# Patient Record
Sex: Female | Born: 1945 | Race: White | Hispanic: No | Marital: Married | Smoking: Never smoker
Health system: Southern US, Community
[De-identification: ages and names within clinical notes are randomized; demographics above are authoritative.]

## PROBLEM LIST (undated history)

## (undated) DIAGNOSIS — T8859XA Other complications of anesthesia, initial encounter: Secondary | ICD-10-CM

## (undated) DIAGNOSIS — R519 Headache, unspecified: Secondary | ICD-10-CM

## (undated) DIAGNOSIS — I209 Angina pectoris, unspecified: Secondary | ICD-10-CM

## (undated) DIAGNOSIS — E039 Hypothyroidism, unspecified: Secondary | ICD-10-CM

## (undated) DIAGNOSIS — Z9181 History of falling: Secondary | ICD-10-CM

## (undated) DIAGNOSIS — E1140 Type 2 diabetes mellitus with diabetic neuropathy, unspecified: Secondary | ICD-10-CM

## (undated) DIAGNOSIS — R4781 Slurred speech: Secondary | ICD-10-CM

## (undated) DIAGNOSIS — M7918 Myalgia, other site: Secondary | ICD-10-CM

## (undated) DIAGNOSIS — M542 Cervicalgia: Secondary | ICD-10-CM

## (undated) DIAGNOSIS — L719 Rosacea, unspecified: Secondary | ICD-10-CM

## (undated) DIAGNOSIS — G5600 Carpal tunnel syndrome, unspecified upper limb: Secondary | ICD-10-CM

## (undated) DIAGNOSIS — E7800 Pure hypercholesterolemia, unspecified: Secondary | ICD-10-CM

## (undated) DIAGNOSIS — T4145XA Adverse effect of unspecified anesthetic, initial encounter: Secondary | ICD-10-CM

## (undated) DIAGNOSIS — C801 Malignant (primary) neoplasm, unspecified: Secondary | ICD-10-CM

## (undated) DIAGNOSIS — M810 Age-related osteoporosis without current pathological fracture: Secondary | ICD-10-CM

## (undated) DIAGNOSIS — R42 Dizziness and giddiness: Secondary | ICD-10-CM

## (undated) DIAGNOSIS — C4491 Basal cell carcinoma of skin, unspecified: Secondary | ICD-10-CM

## (undated) DIAGNOSIS — R569 Unspecified convulsions: Secondary | ICD-10-CM

## (undated) DIAGNOSIS — M5020 Other cervical disc displacement, unspecified cervical region: Secondary | ICD-10-CM

## (undated) DIAGNOSIS — Z9641 Presence of insulin pump (external) (internal): Secondary | ICD-10-CM

## (undated) DIAGNOSIS — I639 Cerebral infarction, unspecified: Secondary | ICD-10-CM

## (undated) DIAGNOSIS — E876 Hypokalemia: Secondary | ICD-10-CM

## (undated) DIAGNOSIS — E11319 Type 2 diabetes mellitus with unspecified diabetic retinopathy without macular edema: Secondary | ICD-10-CM

## (undated) DIAGNOSIS — H547 Unspecified visual loss: Secondary | ICD-10-CM

## (undated) DIAGNOSIS — D649 Anemia, unspecified: Secondary | ICD-10-CM

## (undated) DIAGNOSIS — E119 Type 2 diabetes mellitus without complications: Secondary | ICD-10-CM

## (undated) DIAGNOSIS — R200 Anesthesia of skin: Secondary | ICD-10-CM

## (undated) DIAGNOSIS — K3184 Gastroparesis: Secondary | ICD-10-CM

## (undated) DIAGNOSIS — H5440 Blindness, one eye, unspecified eye: Secondary | ICD-10-CM

## (undated) DIAGNOSIS — I959 Hypotension, unspecified: Secondary | ICD-10-CM

## (undated) DIAGNOSIS — I10 Essential (primary) hypertension: Secondary | ICD-10-CM

## (undated) DIAGNOSIS — Z973 Presence of spectacles and contact lenses: Secondary | ICD-10-CM

## (undated) HISTORY — DX: Cerebral infarction, unspecified: I63.9

## (undated) HISTORY — PX: EYE SURGERY: SHX253

## (undated) HISTORY — PX: HEMORROIDECTOMY: SUR656

## (undated) HISTORY — PX: APPENDECTOMY: SHX54

## (undated) HISTORY — PX: TUBAL LIGATION: SHX77

## (undated) HISTORY — PX: HERNIA REPAIR: SHX51

## (undated) HISTORY — PX: FRACTURE SURGERY: SHX138

## (undated) HISTORY — PX: TONSILLECTOMY: SUR1361

## (undated) HISTORY — PX: JOINT REPLACEMENT: SHX530

## (undated) HISTORY — PX: SHOULDER HEMI-ARTHROPLASTY: SHX5049

## (undated) HISTORY — PX: SYNOVECTOMY: SHX133

## (undated) HISTORY — PX: BILATERAL CARPAL TUNNEL RELEASE: SHX6508

## (undated) HISTORY — PX: SHOULDER ARTHROSCOPY: SHX128

## (undated) HISTORY — PX: COLONOSCOPY: SHX174

## (undated) HISTORY — PX: OTHER SURGICAL HISTORY: SHX169

## (undated) HISTORY — PX: TRIGGER FINGER RELEASE: SHX641

---

## 2003-12-10 ENCOUNTER — Emergency Department (HOSPITAL_COMMUNITY): Admission: EM | Admit: 2003-12-10 | Discharge: 2003-12-10 | Payer: Self-pay | Admitting: Emergency Medicine

## 2004-08-11 ENCOUNTER — Ambulatory Visit: Payer: Self-pay | Admitting: Gastroenterology

## 2005-02-08 ENCOUNTER — Ambulatory Visit: Payer: Self-pay | Admitting: Ophthalmology

## 2005-02-15 ENCOUNTER — Ambulatory Visit: Payer: Self-pay | Admitting: Ophthalmology

## 2006-05-03 ENCOUNTER — Ambulatory Visit: Payer: Self-pay | Admitting: Family Medicine

## 2007-08-16 ENCOUNTER — Ambulatory Visit: Payer: Self-pay | Admitting: Ophthalmology

## 2007-08-16 ENCOUNTER — Other Ambulatory Visit: Payer: Self-pay

## 2007-08-28 ENCOUNTER — Ambulatory Visit: Payer: Self-pay | Admitting: Ophthalmology

## 2009-08-21 HISTORY — PX: AUGMENTATION MAMMAPLASTY: SUR837

## 2009-10-07 ENCOUNTER — Ambulatory Visit: Payer: Self-pay | Admitting: Gastroenterology

## 2010-01-21 ENCOUNTER — Inpatient Hospital Stay: Payer: Self-pay | Admitting: Internal Medicine

## 2011-02-08 ENCOUNTER — Encounter: Payer: Self-pay | Admitting: Neurology

## 2011-02-19 ENCOUNTER — Encounter: Payer: Self-pay | Admitting: Neurology

## 2011-03-22 ENCOUNTER — Encounter: Payer: Self-pay | Admitting: Neurology

## 2012-10-19 ENCOUNTER — Emergency Department: Payer: Self-pay | Admitting: Emergency Medicine

## 2012-10-19 LAB — PROTIME-INR
INR: 2.2
Prothrombin Time: 24.2 secs — ABNORMAL HIGH (ref 11.5–14.7)

## 2012-10-19 LAB — CBC
HCT: 36.5 % (ref 35.0–47.0)
Platelet: 213 10*3/uL (ref 150–440)

## 2012-10-19 LAB — TSH: Thyroid Stimulating Horm: 0.805 u[IU]/mL

## 2012-10-19 LAB — COMPREHENSIVE METABOLIC PANEL
Albumin: 4 g/dL (ref 3.4–5.0)
Bilirubin,Total: 0.3 mg/dL (ref 0.2–1.0)
Co2: 29 mmol/L (ref 21–32)
EGFR (Non-African Amer.): >60
Osmolality: 286 (ref 275–301)
Potassium: 4 mmol/L (ref 3.5–5.1)
SGOT(AST): 23 U/L (ref 15–37)
SGPT (ALT): 28 U/L (ref 12–78)

## 2012-10-19 LAB — URINALYSIS, COMPLETE
Bacteria: NONE SEEN
Bilirubin,UR: NEGATIVE
Blood: NEGATIVE
Glucose,UR: >=500 mg/dL (ref 0–75)
Ketone: NEGATIVE
Leukocyte Esterase: NEGATIVE
Nitrite: NEGATIVE
Ph: 5 (ref 4.5–8.0)
Protein: NEGATIVE
RBC,UR: 1 /HPF (ref 0–5)
WBC UR: <1 /HPF (ref 0–5)

## 2012-10-19 LAB — TROPONIN I: Troponin-I: <0.02 ng/mL

## 2013-06-25 ENCOUNTER — Ambulatory Visit: Payer: Self-pay | Admitting: Neurology

## 2014-10-08 ENCOUNTER — Ambulatory Visit: Payer: Self-pay | Admitting: Gastroenterology

## 2014-12-01 ENCOUNTER — Emergency Department
Admit: 2014-12-01 | Disposition: A | Discharge status: Home / Self Care | Payer: Self-pay | Admitting: Emergency Medicine

## 2014-12-01 LAB — CBC
HCT: 35.3 % (ref 35.0–47.0)
HGB: 11.8 g/dL — AB (ref 12.0–16.0)
MCH: 31.3 pg (ref 26.0–34.0)
MCHC: 33.5 g/dL (ref 32.0–36.0)
MCV: 93 fL (ref 80–100)
PLATELETS: 210 10*3/uL (ref 150–440)
RBC: 3.79 10*6/uL — AB (ref 3.80–5.20)
RDW: 12.6 % (ref 11.5–14.5)
WBC: 5.4 10*3/uL (ref 3.6–11.0)

## 2014-12-01 LAB — BASIC METABOLIC PANEL
ANION GAP: 5 — AB (ref 7–16)
BUN: 19 mg/dL
Calcium, Total: 9.1 mg/dL
Chloride: 97 mmol/L — ABNORMAL LOW
Co2: 32 mmol/L
Creatinine: 0.54 mg/dL
EGFR (African American): >60
EGFR (Non-African Amer.): >60
Glucose: 196 mg/dL — ABNORMAL HIGH
POTASSIUM: 4.1 mmol/L
SODIUM: 134 mmol/L — AB

## 2014-12-01 LAB — PROTIME-INR
INR: 1.6
Prothrombin Time: 19 secs — ABNORMAL HIGH

## 2015-05-25 ENCOUNTER — Emergency Department (HOSPITAL_COMMUNITY): Payer: Medicare Other

## 2015-05-25 ENCOUNTER — Emergency Department (HOSPITAL_COMMUNITY)
Admission: EM | Admit: 2015-05-25 | Discharge: 2015-05-25 | Disposition: A | Discharge status: Home / Self Care | Payer: Medicare Other | Attending: Emergency Medicine | Admitting: Emergency Medicine

## 2015-05-25 ENCOUNTER — Encounter (HOSPITAL_COMMUNITY): Payer: Self-pay | Admitting: *Deleted

## 2015-05-25 DIAGNOSIS — Y9389 Activity, other specified: Secondary | ICD-10-CM | POA: Insufficient documentation

## 2015-05-25 DIAGNOSIS — E119 Type 2 diabetes mellitus without complications: Secondary | ICD-10-CM | POA: Insufficient documentation

## 2015-05-25 DIAGNOSIS — Z79899 Other long term (current) drug therapy: Secondary | ICD-10-CM | POA: Insufficient documentation

## 2015-05-25 DIAGNOSIS — H5461 Unqualified visual loss, right eye, normal vision left eye: Secondary | ICD-10-CM | POA: Diagnosis not present

## 2015-05-25 DIAGNOSIS — I10 Essential (primary) hypertension: Secondary | ICD-10-CM | POA: Diagnosis not present

## 2015-05-25 DIAGNOSIS — Z8673 Personal history of transient ischemic attack (TIA), and cerebral infarction without residual deficits: Secondary | ICD-10-CM | POA: Insufficient documentation

## 2015-05-25 DIAGNOSIS — W108XXA Fall (on) (from) other stairs and steps, initial encounter: Secondary | ICD-10-CM | POA: Insufficient documentation

## 2015-05-25 DIAGNOSIS — S00531A Contusion of lip, initial encounter: Secondary | ICD-10-CM | POA: Insufficient documentation

## 2015-05-25 DIAGNOSIS — I2510 Atherosclerotic heart disease of native coronary artery without angina pectoris: Secondary | ICD-10-CM | POA: Diagnosis not present

## 2015-05-25 DIAGNOSIS — S0292XA Unspecified fracture of facial bones, initial encounter for closed fracture: Secondary | ICD-10-CM

## 2015-05-25 DIAGNOSIS — S6991XA Unspecified injury of right wrist, hand and finger(s), initial encounter: Secondary | ICD-10-CM | POA: Diagnosis present

## 2015-05-25 DIAGNOSIS — Y998 Other external cause status: Secondary | ICD-10-CM | POA: Insufficient documentation

## 2015-05-25 DIAGNOSIS — Y92242 Post office as the place of occurrence of the external cause: Secondary | ICD-10-CM | POA: Diagnosis not present

## 2015-05-25 DIAGNOSIS — S0033XA Contusion of nose, initial encounter: Secondary | ICD-10-CM | POA: Diagnosis not present

## 2015-05-25 DIAGNOSIS — Z7901 Long term (current) use of anticoagulants: Secondary | ICD-10-CM | POA: Insufficient documentation

## 2015-05-25 DIAGNOSIS — Z7982 Long term (current) use of aspirin: Secondary | ICD-10-CM | POA: Insufficient documentation

## 2015-05-25 DIAGNOSIS — W19XXXA Unspecified fall, initial encounter: Secondary | ICD-10-CM

## 2015-05-25 DIAGNOSIS — S62101A Fracture of unspecified carpal bone, right wrist, initial encounter for closed fracture: Secondary | ICD-10-CM | POA: Diagnosis not present

## 2015-05-25 HISTORY — DX: Cerebral infarction, unspecified: I63.9

## 2015-05-25 HISTORY — DX: Type 2 diabetes mellitus without complications: E11.9

## 2015-05-25 HISTORY — DX: Essential (primary) hypertension: I10

## 2015-05-25 LAB — PROTIME-INR
INR: 1.83 — AB (ref 0.00–1.49)
Prothrombin Time: 21.1 seconds — ABNORMAL HIGH (ref 11.6–15.2)

## 2015-05-25 LAB — CBG MONITORING, ED
GLUCOSE-CAPILLARY: 74 mg/dL (ref 65–99)
Glucose-Capillary: 137 mg/dL — ABNORMAL HIGH (ref 65–99)

## 2015-05-25 MED ORDER — OXYCODONE HCL 5 MG PO TABS: 5.0000 mg | ORAL_TABLET | Freq: Four times a day (QID) | ORAL | Status: DC | PRN

## 2015-05-25 MED ORDER — ONDANSETRON HCL 4 MG/2ML IJ SOLN
4.0000 mg | Freq: Once | INTRAMUSCULAR | Status: DC | PRN
  Administered 2015-05-25: 4 mg via INTRAVENOUS
  Filled 2015-05-25: qty 2

## 2015-05-25 MED ORDER — MORPHINE SULFATE (PF) 4 MG/ML IV SOLN
4.0000 mg | Freq: Once | INTRAVENOUS | Status: DC | PRN
  Administered 2015-05-25: 4 mg via INTRAVENOUS
  Filled 2015-05-25: qty 1

## 2015-05-25 MED ORDER — LIDOCAINE HCL 2 % IJ SOLN
INTRAMUSCULAR | Status: AC
Start: 2015-05-25 — End: 2015-05-25
  Administered 2015-05-25: 20 mL
  Filled 2015-05-25: qty 20

## 2015-05-25 MED ORDER — LIDOCAINE HCL 2 % IJ SOLN
20.0000 mL | Freq: Once | INTRAMUSCULAR | Status: AC
  Administered 2015-05-25: 20 mL

## 2015-05-25 NOTE — ED Notes (Signed)
Patient was exiting post office and missed stepped off of the curve. Patient did try to back the fall with the right wrist and there is deformity noted. Patient is on coumadin and she bumped her head as well with swelling to her lip, abrasion to her right knee. Patient has had a total of 121mcg of fentanyl with pain controlled at 2. Patient is in a c-collar but was able to demonstrate range of motion for EMS. Ice and splint to the right wrist.

## 2015-05-25 NOTE — Consult Note (Signed)
Reason for Consult:Right distal radius fracture Referring Physician: Dr. Rhea Pink D Petrik is an 69 y.o. female.  HPI: The patient is a 69 year old female who presents to the emergency setting today for evaluation. She unfortunately tripped over a curb [place] the post office earlier [place]. She complains of right wrist pain. She complains of pain about her right and left cheeks as well as nose. She has recently been seen and evaluated by the emergency room staff. She is noted to have a displaced distal radius fracture in his previous been been placed into a splint per the emergency room staff. Dissection comminuted displaced interarticular distal raise fracture. She is also noted to have maxillary fractures stable nasal fracture which was noted to be stable labial swelling and ecchymosis about the right upper lip. In addition bilateral abrasions of the anterior knees. She had a prior CT scan of the head and neck injury in the above fractures. She will be referred to Dr. Thea Gist, for further management of her facial fractures. We will consult in regards to the right upper extremity. The patient has previously been given a hematoma block in the emergency room setting and underwent a preliminary attempt at reduction per the emergency room staff. She is comfortable she is in no acute distress. She is conversant. Mild tenderness about the wrist at this juncture, she complains of mild tenderness about the bilateral knees. I should note she had an injury over a week ago when she states she sprained her ankle she is currently being seen and treated by Dr.Troxler in [place]. The patient has a medical history consistent with our CVA currently on chronic anticoagulation, Coumadin. 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 Her primary care physician is Dr. Lovie Macadamia in  [place].  Past Medical History  Diagnosis Date  . Diabetes mellitus without complication ([place])   . Hypertension   . Coronary artery disease   . Stroke [facility])     2002    Past Surgical History  Procedure Laterality Date  . Tonsillectomy    . Hemorroidectomy    . Hernia repair    . Joint replacement    Multiple trigger finger releases, bilateral carpal tunnel releases, Probable Left shoulder Hemiarthroplasty  No family history on file.  Social History:  reports that she has never smoked. She does not have any smokeless tobacco history on file. She reports that she does not drink alcohol. Her drug history is not on file.  Allergies:  Allergies  Allergen Reactions  . Codeine Nausea Only    Can take Vicodin and Percocet   . Meperidine Nausea Only  . Metoclopramide Hcl Nausea Only  . Stadol  [Butorphanol] Nausea Only  . Tussionex Pennkinetic Er [Hydrocod Polst-Cpm Polst Er] Other (See Comments)    Filled patient with sugar   . Carbocaine  [Mepivacaine Hcl] Palpitations    Medications:  Current facility-administered medications:  .  morphine 4 MG/ML injection 4 mg, 4 mg, Intravenous, Once PRN, Margarita Mail, PA-C, 4 mg at 05/25/15 1232 .  ondansetron (ZOFRAN) injection 4 mg, 4 mg, Intravenous, Once PRN, Margarita Mail, PA-C, 4 mg at 05/25/15 1232  Current outpatient prescriptions:  .  Ascorbic Acid (VITAMIN C PO), Take 1 tablet by mouth daily with breakfast., Disp: , Rfl:  .  aspirin 325 MG EC tablet, Take 325 mg by mouth daily with breakfast., Disp: , Rfl:  .  butalbital-acetaminophen-caffeine (FIORICET, ESGIC) 50-325-40 MG tablet,  Take 0.5-1 tablets by mouth 3 (three) times daily as needed for headache., Disp: , Rfl:  .  Calcium Carb-Cholecalciferol (CALCIUM 600 + D PO), Take 1 tablet by mouth every evening., Disp: , Rfl:  .  carboxymethylcellulose (REFRESH PLUS) 0.5 % SOLN, Take 1-2 drops by mouth daily after breakfast., Disp: , Rfl:  .  doxycycline (ORACEA) 40 MG  capsule, Take 40 mg by mouth every evening., Disp: , Rfl:  .  folic acid (FOLVITE) 1 MG tablet, Take 1 mg by mouth daily., Disp: , Rfl:  .  gabapentin (NEURONTIN) 300 MG capsule, Take 300 mg by mouth 3 (three) times daily. Takes in the morning, at dinner, and at bedtime, Disp: , Rfl:  .  hydrochlorothiazide (MICROZIDE) 12.5 MG capsule, Take 12.5 mg by mouth daily with breakfast., Disp: , Rfl:  .  Insulin Human (INSULIN PUMP) SOLN, Inject 0.4-4 each into the skin continuous. Uses Novolog, Disp: , Rfl:  .  IRON, FERROUS GLUCONATE, PO, Take 1 tablet by mouth every evening., Disp: , Rfl:  .  losartan (COZAAR) 100 MG tablet, Take 100 mg by mouth every evening., Disp: , Rfl:  .  Multiple Vitamin (MULTIVITAMIN WITH MINERALS) TABS tablet, Take 1 tablet by mouth every evening., Disp: , Rfl:  .  polyethylene glycol (MIRALAX / GLYCOLAX) packet, Take 17 g by mouth every other day., Disp: , Rfl:  .  promethazine (PHENERGAN) 25 MG suppository, Place 25 mg rectally every 6 (six) hours as needed for nausea or vomiting., Disp: , Rfl:  .  simvastatin (ZOCOR) 20 MG tablet, Take 20 mg by mouth every evening., Disp: , Rfl:  .  tiZANidine (ZANAFLEX) 2 MG tablet, Take 2 mg by mouth at bedtime., Disp: , Rfl:  .  venlafaxine (EFFEXOR) 75 MG tablet, Take 75 mg by mouth 2 (two) times daily., Disp: , Rfl:  .  VITAMIN E PO, Take 1 tablet by mouth every evening., Disp: , Rfl:  .  warfarin (COUMADIN) 5 MG tablet, Take 10 mg by mouth daily at 6 PM., Disp: , Rfl:  .  oxyCODONE (ROXICODONE) 5 MG immediate release tablet, Take 1 tablet (5 mg total) by mouth every 6 (six) hours as needed for severe pain., Disp: 15 tablet, Rfl: 0  Results for orders placed or performed during the hospital encounter of 05/25/15 (from the past 48 hour(s))  CBG monitoring, ED     Status: Abnormal   Collection Time: 05/25/15 10:55 AM  Result Value Ref Range   Glucose-Capillary 137 (H) 65 - 99 mg/dL  Protime-INR     Status: Abnormal   Collection Time:  05/25/15 11:39 AM  Result Value Ref Range   Prothrombin Time 21.1 (H) 11.6 - 15.2 seconds   INR 1.83 (H) 0.00 - 1.49  CBG monitoring, ED     Status: None   Collection Time: 05/25/15  4:06 PM  Result Value Ref Range   Glucose-Capillary 74 65 - 99 mg/dL    Dg Wrist Complete Right  05/25/2015   CLINICAL DATA:  Post reduction right wrist  EXAM: RIGHT WRIST - COMPLETE 3+ VIEW  COMPARISON:  May 25, 2015  FINDINGS: There is comminuted displaced intra-articular impacted fracture of distal radius in cast. The fracture is minimally less dorsal angulated relative to the precast films. The previously question ulna styloid fracture and triquetrum fracture are not as well seen through cast.  IMPRESSION: Comminuted displaced intra-articular impacted fracture of distal radius in cast. The fracture is minimally less dorsal angulated relative to the  precast films.   Electronically Signed   By: Abelardo Diesel M.D.   On: 05/25/2015 15:11   Dg Wrist Complete Right  05/25/2015   CLINICAL DATA:  Pt missed step from curb [place] out of post office and tried to catch fall with rt hand, pain, deformity distal rt forearm  EXAM: RIGHT WRIST - COMPLETE 3+ VIEW  COMPARISON:  None.  FINDINGS: There is a comminuted fracture of the distal radius, associated dorsal tilt but minimal displacement. There is a fracture of the ulnar styloid. There is ulnar plus variance. On the lateral view there is a small bone fragment posterior to the proximal carpus, raising the question of a triquetrum fracture versus fragment of the ulna. There is diffuse soft tissue swelling and general deformity of the wrist. Vascular calcifications are noted. Bone mineral density is decreased.  IMPRESSION: 1. Comminuted fracture of the distal radius. 2. Ulnar styloid fracture. 3. Possible triquetrum fracture.   Electronically Signed   By: Nolon Nations M.D.   On: 05/25/2015 12:02   Ct Head Wo Contrast  05/25/2015   CLINICAL DATA:  Pain following fall  EXAM:  CT HEAD WITHOUT CONTRAST  CT MAXILLOFACIAL WITHOUT CONTRAST  CT CERVICAL SPINE WITHOUT CONTRAST  TECHNIQUE: Multidetector CT imaging of the head, cervical spine, and maxillofacial structures were performed using the standard protocol without intravenous contrast. Multiplanar CT image reconstructions of the cervical spine and maxillofacial structures were also generated.  COMPARISON:  Head CT December 01, 2014; cervical MRI June 25, 2013  FINDINGS: CT HEAD FINDINGS  There is mild diffuse atrophy. There is no intracranial mass hemorrhage, extra-axial fluid collection, or midline shift. There is patchy small vessel disease in the centra semiovale bilaterally. 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 There is small vessel disease in the anterior limb of the left internal capsule, stable as well. Small lacunar infarcts are noted in each thalamus. There is evidence of a small lacunar infarct in the mid left cerebellum, stable. There is small vessel disease in the lower pons bilaterally, stable. There is no new gray-white compartment lesion. No acute infarct evident. The bony calvarium appears intact. The mastoid air cells are clear.  CT MAXILLOFACIAL FINDINGS  There is a slightly displaced fracture of the left mid porch nasal bone. A tiny avulsion off the distal aspect of the nasion is also noted. There is a nondisplaced fracture along the lateral aspect of the anterior left maxillary antrum. There is a subtle nondisplaced fracture of the lateral left maxillary antrum. No other fractures are identified. No dislocation.  There is soft tissue swelling over the left side of the face. There is no intraorbital lesion.  There is a small air-fluid level in the right maxillary antrum. The other paranasal sinuses are clear. The ostiomeatal unit complexes are patent bilaterally. There is slight leftward deviation the nasal septum. There is no nares obstruction.  Salivary glands  appear symmetric and normal bilaterally. No adenopathy.  CT CERVICAL SPINE FINDINGS  There is no demonstrable fracture. There is minimal anterolisthesis of C3 on C4. There is minimal retrolisthesis of C4 on C5. There is minimal retrolisthesis of C5 on C6. No other spondylolisthesis. Prevertebral soft tissues and predental space regions are normal. There is moderately severe disc space narrowing at C4-5 and C5-6. There is moderate narrowing at C3-4 and C6-7. There is multilevel facet hypertrophy. No frank disc extrusion or stenosis. There is moderate exit foraminal narrowing at C4-5  and C5-6 bilaterally. No frank disc extrusion or stenosis. Bones are somewhat osteoporotic. There is calcification in each carotid artery.  IMPRESSION: CT head: Atrophy with prior small infarcts and small vessel disease, both supratentorial and infratentorial. No acute infarct evident. No hemorrhage or mass effect. No extra-axial fluid collections.  CT maxillofacial: Slightly displaced fracture mid portion left nasal bone. Rather subtle fracture along the lateral aspect of the anterior left maxillary antrum. There is also a subtle nondisplaced fracture of the lateral left maxillary antrum. There is a tiny avulsion along the distal most aspect of the nasion midline. No dislocations. Small air-fluid level right maxillary antrum. Other paranasal sinuses clear. The ostia BB complexes are patent bilaterally. There is mild leftward deviation of the nasal septum.  CT cervical spine: No fracture. Slight spondylolisthesis at several levels is felt to be due to underlying spondylosis. 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 No disc extrusion or stenosis apparent. Bones osteoporotic. Calcification in each carotid artery.   Electronically Signed   By: Lowella Grip III M.D.   On: 05/25/2015 12:42   Ct Cervical Spine Wo Contrast  05/25/2015   CLINICAL DATA:  Pain following fall  EXAM: CT HEAD WITHOUT CONTRAST  CT MAXILLOFACIAL  WITHOUT CONTRAST  CT CERVICAL SPINE WITHOUT CONTRAST  TECHNIQUE: Multidetector CT imaging of the head, cervical spine, and maxillofacial structures were performed using the standard protocol without intravenous contrast. Multiplanar CT image reconstructions of the cervical spine and maxillofacial structures were also generated.  COMPARISON:  Head CT December 01, 2014; cervical MRI June 25, 2013  FINDINGS: CT HEAD FINDINGS  There is mild diffuse atrophy. There is no intracranial mass hemorrhage, extra-axial fluid collection, or midline shift. There is patchy small vessel disease in the centra semiovale bilaterally. 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 There is small vessel disease in the anterior limb of the left internal capsule, stable as well. Small lacunar infarcts are noted in each thalamus. There is evidence of a small lacunar infarct in the mid left cerebellum, stable. There is small vessel disease in the lower pons bilaterally, stable. There is no new gray-white compartment lesion. No acute infarct evident. The bony calvarium appears intact. The mastoid air cells are clear.  CT MAXILLOFACIAL FINDINGS  There is a slightly displaced fracture of the left mid porch nasal bone. A tiny avulsion off the distal aspect of the nasion is also noted. There is a nondisplaced fracture along the lateral aspect of the anterior left maxillary antrum. There is a subtle nondisplaced fracture of the lateral left maxillary antrum. No other fractures are identified. No dislocation.  There is soft tissue swelling over the left side of the face. There is no intraorbital lesion.  There is a small air-fluid level in the right maxillary antrum. The other paranasal sinuses are clear. The ostiomeatal unit complexes are patent bilaterally. There is slight leftward deviation the nasal septum. There is no nares obstruction.  Salivary glands appear symmetric and normal bilaterally. No  adenopathy.  CT CERVICAL SPINE FINDINGS  There is no demonstrable fracture. There is minimal anterolisthesis of C3 on C4. There is minimal retrolisthesis of C4 on C5. There is minimal retrolisthesis of C5 on C6. No other spondylolisthesis. Prevertebral soft tissues and predental space regions are normal. There is moderately severe disc space narrowing at C4-5 and C5-6. There is moderate narrowing at C3-4 and C6-7. There is multilevel facet hypertrophy. No frank disc extrusion or  stenosis. There is moderate exit foraminal narrowing at C4-5 and C5-6 bilaterally. No frank disc extrusion or stenosis. Bones are somewhat osteoporotic. There is calcification in each carotid artery.  IMPRESSION: CT head: Atrophy with prior small infarcts and small vessel disease, both supratentorial and infratentorial. No acute infarct evident. No hemorrhage or mass effect. No extra-axial fluid collections.  CT maxillofacial: Slightly displaced fracture mid portion left nasal bone. Rather subtle fracture along the lateral aspect of the anterior left maxillary antrum. There is also a subtle nondisplaced fracture of the lateral left maxillary antrum. There is a tiny avulsion along the distal most aspect of the nasion midline. No dislocations. Small air-fluid level right maxillary antrum. Other paranasal sinuses clear. The ostia BB complexes are patent bilaterally. There is mild leftward deviation of the nasal septum.  CT cervical spine: No fracture. Slight spondylolisthesis at several levels is felt to be due to underlying spondylosis. 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 No disc extrusion or stenosis apparent. Bones osteoporotic. Calcification in each carotid artery.   Electronically Signed   By: Lowella Grip III M.D.   On: 05/25/2015 12:42   Ct Maxillofacial Wo Cm  05/25/2015   CLINICAL DATA:  Pain following fall  EXAM: CT HEAD WITHOUT CONTRAST  CT MAXILLOFACIAL WITHOUT CONTRAST  CT CERVICAL SPINE WITHOUT CONTRAST   TECHNIQUE: Multidetector CT imaging of the head, cervical spine, and maxillofacial structures were performed using the standard protocol without intravenous contrast. Multiplanar CT image reconstructions of the cervical spine and maxillofacial structures were also generated.  COMPARISON:  Head CT December 01, 2014; cervical MRI June 25, 2013  FINDINGS: CT HEAD FINDINGS  There is mild diffuse atrophy. There is no intracranial mass hemorrhage, extra-axial fluid collection, or midline shift. There is patchy small vessel disease in the centra semiovale bilaterally. 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 There is small vessel disease in the anterior limb of the left internal capsule, stable as well. Small lacunar infarcts are noted in each thalamus. There is evidence of a small lacunar infarct in the mid left cerebellum, stable. There is small vessel disease in the lower pons bilaterally, stable. There is no new gray-white compartment lesion. No acute infarct evident. The bony calvarium appears intact. The mastoid air cells are clear.  CT MAXILLOFACIAL FINDINGS  There is a slightly displaced fracture of the left mid porch nasal bone. A tiny avulsion off the distal aspect of the nasion is also noted. There is a nondisplaced fracture along the lateral aspect of the anterior left maxillary antrum. There is a subtle nondisplaced fracture of the lateral left maxillary antrum. No other fractures are identified. No dislocation.  There is soft tissue swelling over the left side of the face. There is no intraorbital lesion.  There is a small air-fluid level in the right maxillary antrum. The other paranasal sinuses are clear. The ostiomeatal unit complexes are patent bilaterally. There is slight leftward deviation the nasal septum. There is no nares obstruction.  Salivary glands appear symmetric and normal bilaterally. No adenopathy.  CT CERVICAL SPINE FINDINGS  There is no  demonstrable fracture. There is minimal anterolisthesis of C3 on C4. There is minimal retrolisthesis of C4 on C5. There is minimal retrolisthesis of C5 on C6. No other spondylolisthesis. Prevertebral soft tissues and predental space regions are normal. There is moderately severe disc space narrowing at C4-5 and C5-6. There is moderate narrowing at C3-4 and C6-7. There is  multilevel facet hypertrophy. No frank disc extrusion or stenosis. There is moderate exit foraminal narrowing at C4-5 and C5-6 bilaterally. No frank disc extrusion or stenosis. Bones are somewhat osteoporotic. There is calcification in each carotid artery.  IMPRESSION: CT head: Atrophy with prior small infarcts and small vessel disease, both supratentorial and infratentorial. No acute infarct evident. No hemorrhage or mass effect. No extra-axial fluid collections.  CT maxillofacial: Slightly displaced fracture mid portion left nasal bone. Rather subtle fracture along the lateral aspect of the anterior left maxillary antrum. There is also a subtle nondisplaced fracture of the lateral left maxillary antrum. There is a tiny avulsion along the distal most aspect of the nasion midline. No dislocations. Small air-fluid level right maxillary antrum. Other paranasal sinuses clear. The ostia BB complexes are patent bilaterally. There is mild leftward deviation of the nasal septum.  CT cervical spine: No fracture. Slight spondylolisthesis at several levels is felt to be due to underlying spondylosis. 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 No disc extrusion or stenosis apparent. Bones osteoporotic. Calcification in each carotid artery.   Electronically Signed   By: Lowella Grip III M.D.   On: 05/25/2015 12:42    Review of Systems  Constitutional: Negative.   HENT:       Patient with swelling and ecchymosis at upper oral labia Ecchymosis at right cheek/maxilla  Eyes:       Progressive loss of vision in right eye secondary to diabetic  retinopathy Loss of vision left eye secondary to above  Respiratory: Negative.   Cardiovascular: Negative.   Gastrointestinal: Negative.   Genitourinary: Negative.   Musculoskeletal:       See HPI  Skin: Negative.   Neurological: Negative.   Endo/Heme/Allergies: Bruises/bleeds easily.   Blood pressure 114/60, pulse 66, temperature 97.5 F (36.4 C), temperature source Temporal, resp. rate 16, SpO2 94 %. Physical Exam  Evaluation: The patient is very pleasant, conversant, in no acute distress accompanied with her husband. HEENT: Has mild right periorbital ecchymosis to include the bridge of her nares. She has swelling and ecchymosis of present about the right upper labial region of the oral mucosa Chest: Expansions present respirations are nonlabored. Abdomen: Nontender Right upper extremity: She has excellent digital range of motion no significant edema about the digits are present. Radial, ulnar, median nerve are intact. She states her sensation is normal, sugar tong splint is applied to the upper extremity Pelvis is nontender Bilateral lower extremity shows that she has very small abrasions about the bilateral anterior patellas she is able to elicit a full straight leg raise bilaterally she is nontender with internal and external rotation of the hip knees are nontender other than palpatory examination of the anterior patella no signs of effusion or instability present. She has notable ecchymosis and mild swelling about the left ankle from previous ankle sprain that has been treated ointment and region over one week ago she is tender over the distal fibular region as well as the anterior and posterior talofibular ligaments.  Assessment/Plan: Status post fall earlier today with a comminuted displaced, intra-articular, right distal radius fracture Axillary as well as nasal fracture per Dr.Teoh Had a lengthy discussion with the patient in regards to her upper extremity predicament and  treatment options. We have discussed with her given the degree of comminution and displacement recommendations to include consideration for surgical intervention from open reduction internal fixation. This with a course in detail obtaining medical clearance and having her stop her Coumadin for 5-7 day  period. Have discussed with her and the risk and benefits of the surgical endeavors. In addition, we have discussed with her conservative management and the inherent risk of progressive angulation, collapse, chronic pain and some degree of dysfunction. Sitter these options at home and discuss this with her husband. We will contact her tomorrow in regards to her decision and proceed accordingly. She is going to eliminate released off her Coumadin this evening status. She has had prior surgeries and has been able to stop the Coumadin. We'll need to obtain medical clearance per Dr. Providence Crosby in [place] if she desires to proceed ahead. We would look towards this weekend for surgical intervention should she desire. We have discussed with her performing a gentle pre-reduction in an effort not to overly traumatize the wrist given her history of Coumadin. She still has a good hematoma block and thus after obtaining verbal consent the patient was taken out of her splint. Gentle reduction measures were then performed following this she was placed in 10 pounds of fingertrap traction and a sugar tong splint with 3-point mold was then applied. Patient did very well she was noted to be neurovascularly intact after the reduction. Postreduction films are pending currently at the time of this dictation. These will be reviewed. Patient will be discharged home on oxycodone and instructions of diligent elevation, edema control, finger range of motion. We have discussed with her merits of vitamin C as well as a continuation of a stool softener when taking pain medications. All questions were encouraged and answered. Brittinee Risk  L 05/25/2015, 5:14 PM

## 2015-05-25 NOTE — ED Notes (Signed)
Ortho tech and PA at bedside.

## 2015-05-25 NOTE — Discharge Instructions (Signed)
Facial Fracture A facial fracture is a break in one of the bones of your face. HOME CARE INSTRUCTIONS   Protect the injured part of your face until it is healed.  Do not participate in activities which give chance for re-injury until your doctor approves.  Gently wash and dry your face.  Wear head and facial protection while riding a bicycle, motorcycle, or snowmobile. SEEK MEDICAL CARE IF:   An oral temperature above 102 F (38.9 C) develops.  You have severe headaches or notice changes in your vision.  You have new numbness or tingling in your face.  You develop nausea (feeling sick to your stomach), vomiting or a stiff neck. SEEK IMMEDIATE MEDICAL CARE IF:   You develop difficulty seeing or experience double vision.  You become dizzy, lightheaded, or faint.  You develop trouble speaking, breathing, or swallowing.  You have a watery discharge from your nose or ear. MAKE SURE YOU:   Understand these instructions.  Will watch your condition.  Will get help right away if you are not doing well or get worse. Document Released: 08/07/2005 Document Revised: 10/30/2011 Document Reviewed: 03/26/2008 [facility] Patient Information 2015 [place], [place]. This information is not intended to replace advice given to you by your health care provider. Make sure you discuss any questions you have with your health care provider. Wrist Fracture A wrist fracture is a break or crack in one of the bones of your wrist. Your wrist is made up of eight small bones at the palm of your hand (carpal bones) and two long bones that make up your forearm (radius and ulna).  CAUSES   A direct blow to the wrist.  Falling on an outstretched hand.  Trauma, such as a car accident or a fall. RISK FACTORS Risk factors for wrist fracture include:   Participating in contact and high-risk sports, such as skiing, biking, and ice skating.  Taking steroid medicines.  Smoking.  Being female.  Being  Caucasian.  Drinking more than three alcoholic beverages per day.  Having low or lowered bone density (osteoporosis or osteopenia).  Age. Older adults have decreased bone density.  Women who have had menopause.  History of previous fractures. SIGNS AND SYMPTOMS Symptoms of wrist fractures include tenderness, bruising, and inflammation. Additionally, the wrist may hang in an odd position or appear deformed.  DIAGNOSIS Diagnosis may include:  Physical exam.  X-ray. TREATMENT Treatment depends on many factors, including the nature and location of the fracture, your age, and your activity level. Treatment for wrist fracture can be nonsurgical or surgical.  Nonsurgical Treatment A plaster cast or splint may be applied to your wrist if the bone is in a good position. If the fracture is not in good position, it may be necessary for your health care provider to realign it before applying a splint or cast. Usually, a cast or splint will be worn for several weeks.  Surgical Treatment Sometimes the position of the bone is so far out of place that surgery is required to apply a device to hold it together as it heals. Depending on the fracture, there are a number of options for holding the bone in place while it heals, such as a cast and metal pins.  HOME CARE INSTRUCTIONS  Keep your injured wrist elevated and move your fingers as much as possible.  Do not put pressure on any part of your cast or splint. It may break.   Use a plastic bag to protect your cast or  splint from water while bathing or showering. Do not lower your cast or splint into water.  Take medicines only as directed by your health care provider.  Keep your cast or splint clean and dry. If it becomes wet, damaged, or suddenly feels too tight, contact your health care provider right away.  Do not use any tobacco products including cigarettes, chewing tobacco, or electronic cigarettes. Tobacco can delay bone healing. If you  need help quitting, ask your health care provider.  Keep all follow-up visits as directed by your health care provider. This is important.  Ask your health care provider if you should take supplements of calcium and vitamins C and D to promote bone healing. SEEK MEDICAL CARE IF:   Your cast or splint is damaged, breaks, or gets wet.  You have a fever.  You have chills.  You have continued severe pain or more swelling than you did before the cast was put on. SEEK IMMEDIATE MEDICAL CARE IF:   Your hand or fingernails on the injured arm turn blue or gray, or feel cold or numb.  You have decreased feeling in the fingers of your injured arm. MAKE SURE YOU:  Understand these instructions.  Will watch your condition.  Will get help right away if you are not doing well or get worse. Document Released: 05/17/2005 Document Revised: 12/22/2013 Document Reviewed: 08/25/2011 [facility] Patient Information 2015 [place], [place]. This information is not intended to replace advice given to you by your health care provider. Make sure you discuss any questions you have with your health care provider.

## 2015-05-25 NOTE — ED Notes (Signed)
Patient transported to X-ray 

## 2015-05-25 NOTE — ED Provider Notes (Signed)
4:26 PM Signed out to me by Kenton Kingfisher, PA-C.  Plan: Dispo per hand surgery recommendations.  Patient will also need follow-up with ENT.  Patient splinted by orthopedics.  Outpatient follow-up.  Dr. Vanetta Shawl office will contact patient.  Patient will need to seek f/u with ENT.    Patient seen by and discussed with Dr. Venora Maples, who has informed the patient of the discharge plan.    Montine Circle, PA-C 05/25/15 [place], [place] 05/25/15 [id number]  Jola Schmidt, MD 07/01/15 [phone]

## 2015-05-25 NOTE — ED Provider Notes (Signed)
CSN: [id number]     Arrival date & time 05/25/15  1042 History   First MD Initiated Contact with Patient 05/25/15 1050     Chief Complaint  Patient presents with  . Fall     (Consider location/radiation/quality/duration/timing/severity/associated sxs/prior Treatment) The history is provided by the patient, medical records and the spouse. No language interpreter was used.   Adelee Hannula Hyppolite Is a 69 year old female with a past medical history of diabetes, hypertension, coronary artery disease, previous CVA, who presents the emergency department after fall. The patient [place] post [place] when she missed a step causing her to fall forward. She fell onto her outstretched hands, and hit her face against the pavement. She complains of severe pain and deformity in the right wrist, pain in the lip. She denies loss of consciousness. The patient does take Coumadin, but states that she is normally at her therapeutic level. She denies any other injuries Past Medical History  Diagnosis Date  . Diabetes mellitus without complication ([place])   . Hypertension   . Coronary artery disease   . Stroke [facility])     2002   Past Surgical History  Procedure Laterality Date  . Tonsillectomy    . Hemorroidectomy    . Hernia repair    . Joint replacement     No family history on file. Social History  Substance Use Topics  . Smoking status: Never Smoker   . Smokeless tobacco: Not on file  . Alcohol Use: No   OB History    No data available     Review of Systems  Ten systems reviewed and are negative for acute change, except as noted in the HPI.    Allergies  Codeine; Meperidine; Metoclopramide hcl; Stadol ; Tussionex pennkinetic er; and Carbocaine   Home Medications   Prior to Admission medications   Medication Sig Start Date End Date Taking? Authorizing Provider  Ascorbic Acid (VITAMIN C PO) Take 1 tablet by mouth daily with breakfast.   Yes Historical Provider, MD  aspirin 325 MG  EC tablet Take 325 mg by mouth daily with breakfast.   Yes Historical Provider, MD  butalbital-acetaminophen-caffeine (FIORICET, ESGIC) 50-325-40 MG tablet Take 0.5-1 tablets by mouth 3 (three) times daily as needed for headache.   Yes Historical Provider, MD  Calcium Carb-Cholecalciferol (CALCIUM 600 + D PO) Take 1 tablet by mouth every evening.   Yes Historical Provider, MD  carboxymethylcellulose (REFRESH PLUS) 0.5 % SOLN Take 1-2 drops by mouth daily after breakfast.   Yes Historical Provider, MD  doxycycline (ORACEA) 40 MG capsule Take 40 mg by mouth every evening.   Yes Historical Provider, MD  folic acid (FOLVITE) 1 MG tablet Take 1 mg by mouth daily.   Yes Historical Provider, MD  gabapentin (NEURONTIN) 300 MG capsule Take 300 mg by mouth 3 (three) times daily. Takes in the morning, at dinner, and at bedtime   Yes Historical Provider, MD  hydrochlorothiazide (MICROZIDE) 12.5 MG capsule Take 12.5 mg by mouth daily with breakfast.   Yes Historical Provider, MD  Insulin Human (INSULIN PUMP) SOLN Inject 0.4-4 each into the skin continuous. Uses Novolog   Yes Historical Provider, MD  IRON, FERROUS GLUCONATE, PO Take 1 tablet by mouth every evening.   Yes Historical Provider, MD  losartan (COZAAR) 100 MG tablet Take 100 mg by mouth every evening.   Yes Historical Provider, MD  Multiple Vitamin (MULTIVITAMIN WITH MINERALS) TABS tablet Take 1 tablet by mouth every evening.  Yes Historical Provider, MD  polyethylene glycol (MIRALAX / GLYCOLAX) packet Take 17 g by mouth every other day.   Yes Historical Provider, MD  promethazine (PHENERGAN) 25 MG suppository Place 25 mg rectally every 6 (six) hours as needed for nausea or vomiting.   Yes Historical Provider, MD  simvastatin (ZOCOR) 20 MG tablet Take 20 mg by mouth every evening.   Yes Historical Provider, MD  tiZANidine (ZANAFLEX) 2 MG tablet Take 2 mg by mouth at bedtime.   Yes Historical Provider, MD  venlafaxine (EFFEXOR) 75 MG tablet Take 75 mg  by mouth 2 (two) times daily.   Yes Historical Provider, MD  VITAMIN E PO Take 1 tablet by mouth every evening.   Yes Historical Provider, MD  warfarin (COUMADIN) 5 MG tablet Take 10 mg by mouth daily at 6 PM.   Yes Historical Provider, MD   BP 132/62 mmHg  Pulse 65  Temp(Src) 97.5 F (36.4 C) (Temporal)  Resp 18  SpO2 98% Physical Exam  Constitutional: She is oriented to person, place, and time. She appears well-developed and well-nourished. No distress.  HENT:  Head: Normocephalic.  Right Ear: External ear normal.  Left Ear: External ear normal.  Mouth/Throat: Oropharynx is clear and moist.  Large hematoma to the right upper lip, there is a 1 cm superficial laceration of the mucosal surface of her right lip without through and through puncture. Teeth are firmly in place without evidence of infection or fracture. Patient's nasal bone is swollen, tender to palpation. No Battle's signs, no fluid from nose or ear.  Eyes: Conjunctivae and EOM are normal. Pupils are equal, round, and reactive to light. No scleral icterus.  No pain with eye movement  Neck: Normal range of motion.  Cardiovascular: Normal rate, regular rhythm and normal heart sounds.  Exam reveals no gallop and no friction rub.   No murmur heard. Pulmonary/Chest: Effort normal and breath sounds normal. No respiratory distress.  Abdominal: Soft. Bowel sounds are normal. She exhibits no distension and no mass. There is no tenderness. There is no guarding.  Musculoskeletal:  Right wrist with dinner fork deformity, radial pulse intact, patient is able to wiggle fingers, sensation is normal  Neurological: She is alert and oriented to person, place, and time.  Skin: Skin is warm and dry. She is not diaphoretic.  Nursing note and vitals reviewed.   ED Course  Reduction of fracture Date/Time: 05/25/2015 2:45 PM Performed by: Margarita Mail Authorized by: Margarita Mail Consent: Verbal consent obtained. Risks and benefits:  risks, benefits and alternatives were discussed Consent given by: patient Patient identity confirmed: provided demographic data Time out: Immediately prior to procedure a "time out" was called to verify the correct patient, procedure, equipment, support staff and site/side marked as required. Preparation: Patient was prepped and draped in the usual sterile fashion. Local anesthesia used: yes Local anesthetic: lidocaine 2% without epinephrine (hematoma block) Anesthetic total: 15 ml Patient sedated: no Patient tolerance: Patient tolerated the procedure well with no immediate complications  SPLINT APPLICATION Date/Time: 47/03/2955 2:45 PM Performed by: Margarita Mail Authorized by: Margarita Mail Consent: Verbal consent obtained. Risks and benefits: risks, benefits and alternatives were discussed Consent given by: patient Patient identity confirmed: provided demographic data Location details: right wrist Splint type: sugar tong Post-procedure: The splinted body part was neurovascularly unchanged following the procedure. Patient tolerance: Patient tolerated the procedure well with no immediate complications   (including critical care time) Labs Review Labs Reviewed  PROTIME-INR - Abnormal; Notable for the  following:    Prothrombin Time 21.1 (*)    INR 1.83 (*)    All other components within normal limits  CBG MONITORING, ED - Abnormal; Notable for the following:    Glucose-Capillary 137 (*)    All other components within normal limits    Imaging Review Dg Wrist Complete Right  05/25/2015   CLINICAL DATA:  Pt missed step from curb [place] post office and tried to catch fall with rt hand, pain, deformity distal rt forearm  EXAM: RIGHT WRIST - COMPLETE 3+ VIEW  COMPARISON:  None.  FINDINGS: There is a comminuted fracture of the distal radius, associated dorsal tilt but minimal displacement. There is a fracture of the ulnar styloid. There is ulnar plus variance. On the lateral  view there is a small bone fragment posterior to the proximal carpus, raising the question of a triquetrum fracture versus fragment of the ulna. There is diffuse soft tissue swelling and general deformity of the wrist. Vascular calcifications are noted. Bone mineral density is decreased.  IMPRESSION: 1. Comminuted fracture of the distal radius. 2. Ulnar styloid fracture. 3. Possible triquetrum fracture.   Electronically Signed   By: Nolon Nations M.D.   On: 05/25/2015 12:02   Ct Head Wo Contrast  05/25/2015   CLINICAL DATA:  Pain following fall  EXAM: CT HEAD WITHOUT CONTRAST  CT MAXILLOFACIAL WITHOUT CONTRAST  CT CERVICAL SPINE WITHOUT CONTRAST  TECHNIQUE: Multidetector CT imaging of the head, cervical spine, and maxillofacial structures were performed using the standard protocol without intravenous contrast. Multiplanar CT image reconstructions of the cervical spine and maxillofacial structures were also generated.  COMPARISON:  Head CT December 01, 2014; cervical MRI June 25, 2013  FINDINGS: CT HEAD FINDINGS  There is mild diffuse atrophy. There is no intracranial mass hemorrhage, extra-axial fluid collection, or midline shift. There is patchy small vessel disease in the centra semiovale bilaterally. 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 There is small vessel disease in the anterior limb of the left internal capsule, stable as well. Small lacunar infarcts are noted in each thalamus. There is evidence of a small lacunar infarct in the mid left cerebellum, stable. There is small vessel disease in the lower pons bilaterally, stable. There is no new gray-white compartment lesion. No acute infarct evident. The bony calvarium appears intact. The mastoid air cells are clear.  CT MAXILLOFACIAL FINDINGS  There is a slightly displaced fracture of the left mid porch nasal bone. A tiny avulsion off the distal aspect of the nasion is also noted. There is a  nondisplaced fracture along the lateral aspect of the anterior left maxillary antrum. There is a subtle nondisplaced fracture of the lateral left maxillary antrum. No other fractures are identified. No dislocation.  There is soft tissue swelling over the left side of the face. There is no intraorbital lesion.  There is a small air-fluid level in the right maxillary antrum. The other paranasal sinuses are clear. The ostiomeatal unit complexes are patent bilaterally. There is slight leftward deviation the nasal septum. There is no nares obstruction.  Salivary glands appear symmetric and normal bilaterally. No adenopathy.  CT CERVICAL SPINE FINDINGS  There is no demonstrable fracture. There is minimal anterolisthesis of C3 on C4. There is minimal retrolisthesis of C4 on C5. There is minimal retrolisthesis of C5 on C6. No other spondylolisthesis. Prevertebral soft tissues and predental space regions are normal. There is moderately severe disc  space narrowing at C4-5 and C5-6. There is moderate narrowing at C3-4 and C6-7. There is multilevel facet hypertrophy. No frank disc extrusion or stenosis. There is moderate exit foraminal narrowing at C4-5 and C5-6 bilaterally. No frank disc extrusion or stenosis. Bones are somewhat osteoporotic. There is calcification in each carotid artery.  IMPRESSION: CT head: Atrophy with prior small infarcts and small vessel disease, both supratentorial and infratentorial. No acute infarct evident. No hemorrhage or mass effect. No extra-axial fluid collections.  CT maxillofacial: Slightly displaced fracture mid portion left nasal bone. Rather subtle fracture along the lateral aspect of the anterior left maxillary antrum. There is also a subtle nondisplaced fracture of the lateral left maxillary antrum. There is a tiny avulsion along the distal most aspect of the nasion midline. No dislocations. Small air-fluid level right maxillary antrum. Other paranasal sinuses clear. The ostia BB  complexes are patent bilaterally. There is mild leftward deviation of the nasal septum.  CT cervical spine: No fracture. Slight spondylolisthesis at several levels is felt to be due to underlying spondylosis. 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 No disc extrusion or stenosis apparent. Bones osteoporotic. Calcification in each carotid artery.   Electronically Signed   By: Lowella Grip III M.D.   On: 05/25/2015 12:42   Ct Cervical Spine Wo Contrast  05/25/2015   CLINICAL DATA:  Pain following fall  EXAM: CT HEAD WITHOUT CONTRAST  CT MAXILLOFACIAL WITHOUT CONTRAST  CT CERVICAL SPINE WITHOUT CONTRAST  TECHNIQUE: Multidetector CT imaging of the head, cervical spine, and maxillofacial structures were performed using the standard protocol without intravenous contrast. Multiplanar CT image reconstructions of the cervical spine and maxillofacial structures were also generated.  COMPARISON:  Head CT December 01, 2014; cervical MRI June 25, 2013  FINDINGS: CT HEAD FINDINGS  There is mild diffuse atrophy. There is no intracranial mass hemorrhage, extra-axial fluid collection, or midline shift. There is patchy small vessel disease in the centra semiovale bilaterally. 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 There is small vessel disease in the anterior limb of the left internal capsule, stable as well. Small lacunar infarcts are noted in each thalamus. There is evidence of a small lacunar infarct in the mid left cerebellum, stable. There is small vessel disease in the lower pons bilaterally, stable. There is no new gray-white compartment lesion. No acute infarct evident. The bony calvarium appears intact. The mastoid air cells are clear.  CT MAXILLOFACIAL FINDINGS  There is a slightly displaced fracture of the left mid porch nasal bone. A tiny avulsion off the distal aspect of the nasion is also noted. There is a nondisplaced fracture along the lateral  aspect of the anterior left maxillary antrum. There is a subtle nondisplaced fracture of the lateral left maxillary antrum. No other fractures are identified. No dislocation.  There is soft tissue swelling over the left side of the face. There is no intraorbital lesion.  There is a small air-fluid level in the right maxillary antrum. The other paranasal sinuses are clear. The ostiomeatal unit complexes are patent bilaterally. There is slight leftward deviation the nasal septum. There is no nares obstruction.  Salivary glands appear symmetric and normal bilaterally. No adenopathy.  CT CERVICAL SPINE FINDINGS  There is no demonstrable fracture. There is minimal anterolisthesis of C3 on C4. There is minimal retrolisthesis of C4 on C5. There is minimal retrolisthesis of C5 on C6. No other spondylolisthesis. Prevertebral soft tissues and predental  space regions are normal. There is moderately severe disc space narrowing at C4-5 and C5-6. There is moderate narrowing at C3-4 and C6-7. There is multilevel facet hypertrophy. No frank disc extrusion or stenosis. There is moderate exit foraminal narrowing at C4-5 and C5-6 bilaterally. No frank disc extrusion or stenosis. Bones are somewhat osteoporotic. There is calcification in each carotid artery.  IMPRESSION: CT head: Atrophy with prior small infarcts and small vessel disease, both supratentorial and infratentorial. No acute infarct evident. No hemorrhage or mass effect. No extra-axial fluid collections.  CT maxillofacial: Slightly displaced fracture mid portion left nasal bone. Rather subtle fracture along the lateral aspect of the anterior left maxillary antrum. There is also a subtle nondisplaced fracture of the lateral left maxillary antrum. There is a tiny avulsion along the distal most aspect of the nasion midline. No dislocations. Small air-fluid level right maxillary antrum. Other paranasal sinuses clear. The ostia BB complexes are patent bilaterally. There is mild  leftward deviation of the nasal septum.  CT cervical spine: No fracture. Slight spondylolisthesis at several levels is felt to be due to underlying spondylosis. 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 No disc extrusion or stenosis apparent. Bones osteoporotic. Calcification in each carotid artery.   Electronically Signed   By: Lowella Grip III M.D.   On: 05/25/2015 12:42   Ct Maxillofacial Wo Cm  05/25/2015   CLINICAL DATA:  Pain following fall  EXAM: CT HEAD WITHOUT CONTRAST  CT MAXILLOFACIAL WITHOUT CONTRAST  CT CERVICAL SPINE WITHOUT CONTRAST  TECHNIQUE: Multidetector CT imaging of the head, cervical spine, and maxillofacial structures were performed using the standard protocol without intravenous contrast. Multiplanar CT image reconstructions of the cervical spine and maxillofacial structures were also generated.  COMPARISON:  Head CT December 01, 2014; cervical MRI June 25, 2013  FINDINGS: CT HEAD FINDINGS  There is mild diffuse atrophy. There is no intracranial mass hemorrhage, extra-axial fluid collection, or midline shift. There is patchy small vessel disease in the centra semiovale bilaterally. 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 There is small vessel disease in the anterior limb of the left internal capsule, stable as well. Small lacunar infarcts are noted in each thalamus. There is evidence of a small lacunar infarct in the mid left cerebellum, stable. There is small vessel disease in the lower pons bilaterally, stable. There is no new gray-white compartment lesion. No acute infarct evident. The bony calvarium appears intact. The mastoid air cells are clear.  CT MAXILLOFACIAL FINDINGS  There is a slightly displaced fracture of the left mid porch nasal bone. A tiny avulsion off the distal aspect of the nasion is also noted. There is a nondisplaced fracture along the lateral aspect of the anterior left maxillary antrum. There is a  subtle nondisplaced fracture of the lateral left maxillary antrum. No other fractures are identified. No dislocation.  There is soft tissue swelling over the left side of the face. There is no intraorbital lesion.  There is a small air-fluid level in the right maxillary antrum. The other paranasal sinuses are clear. The ostiomeatal unit complexes are patent bilaterally. There is slight leftward deviation the nasal septum. There is no nares obstruction.  Salivary glands appear symmetric and normal bilaterally. No adenopathy.  CT CERVICAL SPINE FINDINGS  There is no demonstrable fracture. There is minimal anterolisthesis of C3 on C4. There is minimal retrolisthesis of C4 on C5. There is minimal retrolisthesis of C5 on C6.  No other spondylolisthesis. Prevertebral soft tissues and predental space regions are normal. There is moderately severe disc space narrowing at C4-5 and C5-6. There is moderate narrowing at C3-4 and C6-7. There is multilevel facet hypertrophy. No frank disc extrusion or stenosis. There is moderate exit foraminal narrowing at C4-5 and C5-6 bilaterally. No frank disc extrusion or stenosis. Bones are somewhat osteoporotic. There is calcification in each carotid artery.  IMPRESSION: CT head: Atrophy with prior small infarcts and small vessel disease, both supratentorial and infratentorial. No acute infarct evident. No hemorrhage or mass effect. No extra-axial fluid collections.  CT maxillofacial: Slightly displaced fracture mid portion left nasal bone. Rather subtle fracture along the lateral aspect of the anterior left maxillary antrum. There is also a subtle nondisplaced fracture of the lateral left maxillary antrum. There is a tiny avulsion along the distal most aspect of the nasion midline. No dislocations. Small air-fluid level right maxillary antrum. Other paranasal sinuses clear. The ostia BB complexes are patent bilaterally. There is mild leftward deviation of the nasal septum.  CT cervical  spine: No fracture. Slight spondylolisthesis at several levels is felt to be due to underlying spondylosis. 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 No disc extrusion or stenosis apparent. Bones osteoporotic. Calcification in each carotid artery.   Electronically Signed   By: Lowella Grip III M.D.   On: 05/25/2015 12:42   I have personally reviewed and evaluated these images and lab results as part of my medical decision-making.   EKG Interpretation None      MDM   Final diagnoses:  Fall    2:46 PM BP 132/62 mmHg  Pulse 65  Temp(Src) 97.5 F (36.4 C) (Temporal)  Resp 18  SpO2 98% Patient with multiple fractures. The right wrist appears to have sig. Colles fracture. Will Reduce and splint. Patient seen in shared visit with attending physician.    Attempted reduction of the patient's unstable fracture, however, with minimal success.    Patient seen in the ED by  PA, Gardiner Fanti All ortho follow up and plan of care per his instruction. Patient is otherwise to be discharged with ENT F/u [facility]). Pain meds. Home care discussed.  Return precautions given. All questions answered to the best of my ability.  The patient appears reasonably screened and/or stabilized for discharge and I doubt any other medical condition or other [facility] requiring further screening, evaluation, or treatment in the ED at this time prior to discharge. HDS, Afebrile, A&O  At discharge. Filed Vitals:   05/25/15 1228 05/25/15 1322 05/25/15 1539 05/25/15 1811  BP: 154/55 132/62 114/60 111/53  Pulse: 76 65 66 71  Temp:    97.9 F (36.6 C)  TempSrc:    Oral  Resp:  18 16 16   SpO2: 99% 98% 94% 94%      Margarita Mail, PA-C 05/27/15 [place], MD 05/27/15 [phone]

## 2015-05-25 NOTE — ED Notes (Signed)
Attending at the bedside.

## 2015-05-25 NOTE — ED Provider Notes (Signed)
Medical screening examination/treatment/procedure(s) were conducted as a shared visit with non-physician practitioner(s) and myself.  I personally evaluated the patient during the encounter.   EKG Interpretation None      I was present and helped perform both the splint and the fracture reduction. Unfortunately, due to the instability of the fracture we will need orthopedic hand surgery consultation in the ER for definitive management and improved reduction. Appreciate the assistance of Dr Veronia Beets and his team  Dg Wrist 2 Views Right  05/25/2015   CLINICAL DATA:  Distal right radius fracture.  Postreduction.  EXAM: RIGHT WRIST - 2 VIEW  6:15 p.m.  COMPARISON:  Radiographs dated 05/25/2015  FINDINGS: There has been further a manipulation of the fracture of the distal radius with marked improvement in alignment and position. The major fracture fragments are anatomic in position. The fracture does involve the articular surface.  IMPRESSION: Elimination of angulation and displacement of the major fragments of the distal radius.   Electronically Signed   By: Lorriane Shire M.D.   On: 05/25/2015 18:34   Dg Wrist Complete Right  05/25/2015   CLINICAL DATA:  Post reduction right wrist  EXAM: RIGHT WRIST - COMPLETE 3+ VIEW  COMPARISON:  May 25, 2015  FINDINGS: There is comminuted displaced intra-articular impacted fracture of distal radius in cast. The fracture is minimally less dorsal angulated relative to the precast films. The previously question ulna styloid fracture and triquetrum fracture are not as well seen through cast.  IMPRESSION: Comminuted displaced intra-articular impacted fracture of distal radius in cast. The fracture is minimally less dorsal angulated relative to the precast films.   Electronically Signed   By: Abelardo Diesel M.D.   On: 05/25/2015 15:11   Dg Wrist Complete Right  05/25/2015   CLINICAL DATA:  Pt missed step [place] and tried to catch fall with rt  hand, pain, deformity distal rt forearm  EXAM: RIGHT WRIST - COMPLETE 3+ VIEW  COMPARISON:  None.  FINDINGS: There is a comminuted fracture of the distal radius, associated dorsal tilt but minimal displacement. There is a fracture of the ulnar styloid. There is ulnar plus variance. On the lateral view there is a small bone fragment posterior to the proximal carpus, raising the question of a triquetrum fracture versus fragment of the ulna. There is diffuse soft tissue swelling and general deformity of the wrist. Vascular calcifications are noted. Bone mineral density is decreased.  IMPRESSION: 1. Comminuted fracture of the distal radius. 2. Ulnar styloid fracture. 3. Possible triquetrum fracture.   Electronically Signed   By: Nolon Nations M.D.   On: 05/25/2015 12:02   Ct Head Wo Contrast  05/25/2015   CLINICAL DATA:  Pain following fall  EXAM: CT HEAD WITHOUT CONTRAST  CT MAXILLOFACIAL WITHOUT CONTRAST  CT CERVICAL SPINE WITHOUT CONTRAST  TECHNIQUE: Multidetector CT imaging of the head, cervical spine, and maxillofacial structures were performed using the standard protocol without intravenous contrast. Multiplanar CT image reconstructions of the cervical spine and maxillofacial structures were also generated.  COMPARISON:  Head CT December 01, 2014; cervical MRI June 25, 2013  FINDINGS: CT HEAD FINDINGS  There is mild diffuse atrophy. There is no intracranial mass hemorrhage, extra-axial fluid collection, or midline shift. There is patchy small vessel disease in the centra semiovale bilaterally. 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 There is small vessel disease in the anterior limb of the left  internal capsule, stable as well. Small lacunar infarcts are noted in each thalamus. There is evidence of a small lacunar infarct in the mid left cerebellum, stable. There is small vessel disease in the lower pons bilaterally, stable. There is no new gray-white  compartment lesion. No acute infarct evident. The bony calvarium appears intact. The mastoid air cells are clear.  CT MAXILLOFACIAL FINDINGS  There is a slightly displaced fracture of the left mid porch nasal bone. A tiny avulsion off the distal aspect of the nasion is also noted. There is a nondisplaced fracture along the lateral aspect of the anterior left maxillary antrum. There is a subtle nondisplaced fracture of the lateral left maxillary antrum. No other fractures are identified. No dislocation.  There is soft tissue swelling over the left side of the face. There is no intraorbital lesion.  There is a small air-fluid level in the right maxillary antrum. The other paranasal sinuses are clear. The ostiomeatal unit complexes are patent bilaterally. There is slight leftward deviation the nasal septum. There is no nares obstruction.  Salivary glands appear symmetric and normal bilaterally. No adenopathy.  CT CERVICAL SPINE FINDINGS  There is no demonstrable fracture. There is minimal anterolisthesis of C3 on C4. There is minimal retrolisthesis of C4 on C5. There is minimal retrolisthesis of C5 on C6. No other spondylolisthesis. Prevertebral soft tissues and predental space regions are normal. There is moderately severe disc space narrowing at C4-5 and C5-6. There is moderate narrowing at C3-4 and C6-7. There is multilevel facet hypertrophy. No frank disc extrusion or stenosis. There is moderate exit foraminal narrowing at C4-5 and C5-6 bilaterally. No frank disc extrusion or stenosis. Bones are somewhat osteoporotic. There is calcification in each carotid artery.  IMPRESSION: CT head: Atrophy with prior small infarcts and small vessel disease, both supratentorial and infratentorial. No acute infarct evident. No hemorrhage or mass effect. No extra-axial fluid collections.  CT maxillofacial: Slightly displaced fracture mid portion left nasal bone. Rather subtle fracture along the lateral aspect of the anterior left  maxillary antrum. There is also a subtle nondisplaced fracture of the lateral left maxillary antrum. There is a tiny avulsion along the distal most aspect of the nasion midline. No dislocations. Small air-fluid level right maxillary antrum. Other paranasal sinuses clear. The ostia BB complexes are patent bilaterally. There is mild leftward deviation of the nasal septum.  CT cervical spine: No fracture. Slight spondylolisthesis at several levels is felt to be due to underlying spondylosis. 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 No disc extrusion or stenosis apparent. Bones osteoporotic. Calcification in each carotid artery.   Electronically Signed   By: Lowella Grip III M.D.   On: 05/25/2015 12:42   Ct Cervical Spine Wo Contrast  05/25/2015   CLINICAL DATA:  Pain following fall  EXAM: CT HEAD WITHOUT CONTRAST  CT MAXILLOFACIAL WITHOUT CONTRAST  CT CERVICAL SPINE WITHOUT CONTRAST  TECHNIQUE: Multidetector CT imaging of the head, cervical spine, and maxillofacial structures were performed using the standard protocol without intravenous contrast. Multiplanar CT image reconstructions of the cervical spine and maxillofacial structures were also generated.  COMPARISON:  Head CT December 01, 2014; cervical MRI June 25, 2013  FINDINGS: CT HEAD FINDINGS  There is mild diffuse atrophy. There is no intracranial mass hemorrhage, extra-axial fluid collection, or midline shift. There is patchy small vessel disease in the centra semiovale bilaterally. 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 There is small  vessel disease in the anterior limb of the left internal capsule, stable as well. Small lacunar infarcts are noted in each thalamus. There is evidence of a small lacunar infarct in the mid left cerebellum, stable. There is small vessel disease in the lower pons bilaterally, stable. There is no new gray-white compartment lesion. No acute infarct evident. The  bony calvarium appears intact. The mastoid air cells are clear.  CT MAXILLOFACIAL FINDINGS  There is a slightly displaced fracture of the left mid porch nasal bone. A tiny avulsion off the distal aspect of the nasion is also noted. There is a nondisplaced fracture along the lateral aspect of the anterior left maxillary antrum. There is a subtle nondisplaced fracture of the lateral left maxillary antrum. No other fractures are identified. No dislocation.  There is soft tissue swelling over the left side of the face. There is no intraorbital lesion.  There is a small air-fluid level in the right maxillary antrum. The other paranasal sinuses are clear. The ostiomeatal unit complexes are patent bilaterally. There is slight leftward deviation the nasal septum. There is no nares obstruction.  Salivary glands appear symmetric and normal bilaterally. No adenopathy.  CT CERVICAL SPINE FINDINGS  There is no demonstrable fracture. There is minimal anterolisthesis of C3 on C4. There is minimal retrolisthesis of C4 on C5. There is minimal retrolisthesis of C5 on C6. No other spondylolisthesis. Prevertebral soft tissues and predental space regions are normal. There is moderately severe disc space narrowing at C4-5 and C5-6. There is moderate narrowing at C3-4 and C6-7. There is multilevel facet hypertrophy. No frank disc extrusion or stenosis. There is moderate exit foraminal narrowing at C4-5 and C5-6 bilaterally. No frank disc extrusion or stenosis. Bones are somewhat osteoporotic. There is calcification in each carotid artery.  IMPRESSION: CT head: Atrophy with prior small infarcts and small vessel disease, both supratentorial and infratentorial. No acute infarct evident. No hemorrhage or mass effect. No extra-axial fluid collections.  CT maxillofacial: Slightly displaced fracture mid portion left nasal bone. Rather subtle fracture along the lateral aspect of the anterior left maxillary antrum. There is also a subtle  nondisplaced fracture of the lateral left maxillary antrum. There is a tiny avulsion along the distal most aspect of the nasion midline. No dislocations. Small air-fluid level right maxillary antrum. Other paranasal sinuses clear. The ostia BB complexes are patent bilaterally. There is mild leftward deviation of the nasal septum.  CT cervical spine: No fracture. Slight spondylolisthesis at several levels is felt to be due to underlying spondylosis. 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 No disc extrusion or stenosis apparent. Bones osteoporotic. Calcification in each carotid artery.   Electronically Signed   By: Lowella Grip III M.D.   On: 05/25/2015 12:42   Ct Maxillofacial Wo Cm  05/25/2015   CLINICAL DATA:  Pain following fall  EXAM: CT HEAD WITHOUT CONTRAST  CT MAXILLOFACIAL WITHOUT CONTRAST  CT CERVICAL SPINE WITHOUT CONTRAST  TECHNIQUE: Multidetector CT imaging of the head, cervical spine, and maxillofacial structures were performed using the standard protocol without intravenous contrast. Multiplanar CT image reconstructions of the cervical spine and maxillofacial structures were also generated.  COMPARISON:  Head CT December 01, 2014; cervical MRI June 25, 2013  FINDINGS: CT HEAD FINDINGS  There is mild diffuse atrophy. There is no intracranial mass hemorrhage, extra-axial fluid collection, or midline shift. There is patchy small vessel disease in the centra semiovale bilaterally. There is evidence of a prior lacunar type infarct at the genu  of the left internal capsule. There is small vessel disease in the anterior limb of the left internal capsule, stable as well. Small lacunar infarcts are noted in each thalamus. There is evidence of a small lacunar infarct in the mid left cerebellum, stable. There is small vessel disease in the lower pons bilaterally, stable. There is no new gray-white compartment lesion. No acute infarct evident. The bony calvarium appears intact. The mastoid air  cells are clear.  CT MAXILLOFACIAL FINDINGS  There is a slightly displaced fracture of the left mid porch nasal bone. A tiny avulsion off the distal aspect of the nasion is also noted. There is a nondisplaced fracture along the lateral aspect of the anterior left maxillary antrum. There is a subtle nondisplaced fracture of the lateral left maxillary antrum. No other fractures are identified. No dislocation.  There is soft tissue swelling over the left side of the face. There is no intraorbital lesion.  There is a small air-fluid level in the right maxillary antrum. The other paranasal sinuses are clear. The ostiomeatal unit complexes are patent bilaterally. There is slight leftward deviation the nasal septum. There is no nares obstruction.  Salivary glands appear symmetric and normal bilaterally. No adenopathy.  CT CERVICAL SPINE FINDINGS  There is no demonstrable fracture. There is minimal anterolisthesis of C3 on C4. There is minimal retrolisthesis of C4 on C5. There is minimal retrolisthesis of C5 on C6. No other spondylolisthesis. Prevertebral soft tissues and predental space regions are normal. There is moderately severe disc space narrowing at C4-5 and C5-6. There is moderate narrowing at C3-4 and C6-7. There is multilevel facet hypertrophy. No frank disc extrusion or stenosis. There is moderate exit foraminal narrowing at C4-5 and C5-6 bilaterally. No frank disc extrusion or stenosis. Bones are somewhat osteoporotic. There is calcification in each carotid artery.  IMPRESSION: CT head: Atrophy with prior small infarcts and small vessel disease, both supratentorial and infratentorial. No acute infarct evident. No hemorrhage or mass effect. No extra-axial fluid collections.  CT maxillofacial: Slightly displaced fracture mid portion left nasal bone. Rather subtle fracture along the lateral aspect of the anterior left maxillary antrum. There is also a subtle nondisplaced fracture of the lateral left maxillary  antrum. There is a tiny avulsion along the distal most aspect of the nasion midline. No dislocations. Small air-fluid level right maxillary antrum. Other paranasal sinuses clear. The ostia BB complexes are patent bilaterally. There is mild leftward deviation of the nasal septum.  CT cervical spine: No fracture. Slight spondylolisthesis at several levels is felt to be due to underlying spondylosis. 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 No disc extrusion or stenosis apparent. Bones osteoporotic. Calcification in each carotid artery.   Electronically Signed   By: Lowella Grip III M.D.   On: 05/25/2015 12:42   I personally reviewed the imaging tests through PACS system I reviewed available ER/hospitalization records through the [place], MD 05/25/15 2102

## 2015-05-27 ENCOUNTER — Encounter (HOSPITAL_COMMUNITY)
Admission: RE | Admit: 2015-05-27 | Discharge: 2015-05-27 | Disposition: A | Discharge status: Home / Self Care | Payer: Medicare Other | Source: Ambulatory Visit | Attending: Orthopedic Surgery | Admitting: Orthopedic Surgery

## 2015-05-27 ENCOUNTER — Encounter (HOSPITAL_COMMUNITY): Payer: Self-pay | Admitting: General Practice

## 2015-05-27 LAB — CBC
HEMATOCRIT: 32.7 % — AB (ref 36.0–46.0)
HEMOGLOBIN: 10.9 g/dL — AB (ref 12.0–15.0)
MCH: 31.1 pg (ref 26.0–34.0)
MCHC: 33.3 g/dL (ref 30.0–36.0)
MCV: 93.2 fL (ref 78.0–100.0)
Platelets: 219 10*3/uL (ref 150–400)
RBC: 3.51 MIL/uL — ABNORMAL LOW (ref 3.87–5.11)
RDW: 12.1 % (ref 11.5–15.5)
WBC: 6.8 10*3/uL (ref 4.0–10.5)

## 2015-05-27 LAB — BASIC METABOLIC PANEL
ANION GAP: 8 (ref 5–15)
BUN: 18 mg/dL (ref 6–20)
CHLORIDE: 94 mmol/L — AB (ref 101–111)
CO2: 33 mmol/L — ABNORMAL HIGH (ref 22–32)
Calcium: 9.5 mg/dL (ref 8.9–10.3)
Creatinine, Ser: 0.38 mg/dL — ABNORMAL LOW (ref 0.44–1.00)
GFR calc Af Amer: >60 mL/min (ref >60)
GFR calc non Af Amer: >60 mL/min (ref >60)
GLUCOSE: 140 mg/dL — AB (ref 65–99)
POTASSIUM: 3.9 mmol/L (ref 3.5–5.1)
Sodium: 135 mmol/L (ref 135–145)

## 2015-05-27 LAB — GLUCOSE, CAPILLARY: Glucose-Capillary: 175 mg/dL — ABNORMAL HIGH (ref 65–99)

## 2015-05-27 LAB — PROTIME-INR
INR: 1.41 (ref 0.00–1.49)
Prothrombin Time: 17.3 seconds — ABNORMAL HIGH (ref 11.6–15.2)

## 2015-05-27 NOTE — Progress Notes (Signed)
Late entry:   Patient instructed to report to [facility] Emergency Room on day of surgery.

## 2015-05-27 NOTE — Pre-Procedure Instructions (Signed)
Erin Daniels  05/27/2015      [facility] [place], [place] - [address] [place] [address] Phone: [phone] Fax: [phone]  [place], [place] [address] Phone: [phone] Fax: [phone]    Your procedure is scheduled on Saturday, October 8th, 2016.  Report to [facility] Admitting at 7:00 A.M.  Call this number if you have problems the morning of surgery:  [phone]   Remember:  Do not eat food or drink liquids after midnight.   Take these medicines the morning of surgery with A SIP OF WATER: Refresh Plus eye drops, Gabapentin (Neurontin), Oxycodone (Roxicodone) if needed, Promethazine (Phenergan) if needed, Venlafaxine (Effexor).    Stop taking: NSAIDS, Aspirin, Aleve, Naproxen, Ibuprofen, Advil, Motrin, Fish oil, all herbal medications, and all vitamins.    Do not wear jewelry, make-up or nail polish.  Do not wear lotions, powders, or perfumes.  You may NOT wear deodorant.  Do not shave 48 hours prior to surgery.    Do not bring valuables to the hospital.  [facility] is not responsible for any belongings or valuables.  Contacts, dentures or bridgework may not be worn into surgery.  Leave your suitcase in the car.  After surgery it may be brought to your room.  For patients admitted to the hospital, discharge time will be determined by your treatment team.  Patients discharged the day of surgery will not be allowed to drive home.   Special instructions:  See attached.   Please read over the following fact sheets that you were given. Pain Booklet, Coughing and Deep Breathing and Surgical Site Infection Prevention      How to Manage Your Diabetes Before Surgery   What do I do about my diabetes medications?    For patients with "Insulin Pumps":  Contact your diabetes doctor for specific instructions before  surgery.   Decrease basal insulin rates by 20% at midnight the night before surgery.  Note that if your surgery is planned to be longer than 2 hours, your insulin pump will be removed and intravenous (IV) insulin will be started and managed by the nurses and anesthesiologist.  You will be able to restart your insulin pump once you are awake and able to manage it.  Make sure to bring insulin pump supplies to the hospital with you in case your site needs to be changed.     Why is it important to control my blood sugar before and after surgery?   Improving blood sugar levels before and after surgery helps healing and can limit problems.  A way of improving blood sugar control is eating a healthy diet by:  - Eating less sugar and carbohydrates  - Increasing activity/exercise  - Talk with your doctor about reaching your blood sugar goals  High blood sugars (greater than 180 mg/dL) can raise your risk of infections and slow down your recovery so you will need to focus on controlling your diabetes during the weeks before surgery.  Make sure that the doctor who takes care of your diabetes knows about your planned surgery including the date and location.  How do I manage my blood sugars before surgery?   Check your blood sugar at least 4 times a day, 2 days before surgery to make sure that they are not too high or low.   Check your blood sugar the  morning of your surgery when you wake up and every 2  hours until you get to the Short-Stay unit.  If your blood sugar is less than 70 mg/dL, you will need to treat for low blood sugar by:  Treat a low blood sugar (less than 70 mg/dL) with 1/2 cup of clear juice (cranberry or apple), 4 glucose tablets, OR glucose gel.  Recheck blood sugar in 15 minutes after treatment (to make sure it is greater than 70 mg/dL).  If blood sugar is not greater than 70 mg/dL on re-check, call [phone] for further instructions.   Report your blood sugar to the  Short-Stay nurse when you get to Short-Stay.  References:  [facility], 2007 "How to Manage your Diabetes Before and After Surgery".

## 2015-05-27 NOTE — Progress Notes (Signed)
PCP - Dr. Juluis Pitch at [facility] Cardiologist - Dr. Saralyn Pilar at [facility] in [place] - Dr. Clarita Leber at [facility]  EKG- 05/27/15 CXR - denies  Echo/Stress test - requested and in care everywhere Cardiac cath - pt. Denies  Patient denies shortness of breath and chest pain at PAT appointment.    Patient reports that her last dose of coumadin was taken on 05/25/15.    Patient states that she takes her blood sugar 8 times a day.  Patient educated on diabetes management prior to surgery and states that she will contact her endocrinologist prior to surgery about instructions for her insulin pump.  Patient understands that it is our recommendation to reduce her basal insulin by 20% midnight prior to surgery but will consult her MD first.  Patient verbalizes understanding of what to do with a blood sugar less than 70 the morning of surgery.

## 2015-05-28 ENCOUNTER — Other Ambulatory Visit: Payer: Self-pay | Admitting: Orthopedic Surgery

## 2015-05-28 ENCOUNTER — Encounter (HOSPITAL_COMMUNITY): Payer: Self-pay

## 2015-05-28 LAB — HEMOGLOBIN A1C
Hgb A1c MFr Bld: 6.2 % — ABNORMAL HIGH (ref 4.8–5.6)
Mean Plasma Glucose: 131 mg/dL

## 2015-05-28 NOTE — Progress Notes (Signed)
I Spoke with Rolla Flatten, RN in the OR to verify if any of the equipment that will be used on patient during surgery will have magnets, per pt request.  She stated none of the equipment used on the patient will have magnets, only the Anesthesia cart/ supplies will have a magnet. Returned call to patient to inform her of this.  She stated that she called [facility] to speak with her endocrinologist regarding her insulin pump, and they told her it would be okay to have her pump on during surgery as long as no equipment with magnets would be used for her surgery.  I also informed her that we  have other patients go into surgery with their insulin pumps on without any problems. Patient voiced understanding and is okay with this.

## 2015-05-28 NOTE — Progress Notes (Addendum)
Anesthesia Chart Review: Patient is a 69 year old female scheduled for ORIF right distal radius fracture with allograft bone grafting for repair and reconstruction on 05/29/15 by Dr. Amedeo Plenty. She fell on 05/25/15 and suffered right radial fracture and slightly displaced fracture mid portions left nasal bone (she is to schedule out-patient ENT f/u ?Dr. Benjamine Mola).  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    PCP is Dr. Juluis Pitch, last visit 04/02/15 for CPE with one year follow-up recommended. (See Care Everywhere) Endocrinologist is Dr. Clarita Leber, last visit 03/11/15 (See Care Everywhere).  She was evaluated by cardiologist Dr. Saralyn Pilar with [facility] in 01/2010 during a hospitalization for TIA symptoms with mild chest discomfort with atypical features. His 01/22/10 consult note states she ruled out for MI and that also "had a recent stress test in the past year, which was negative." His plan was to review her echo and defer stress testing since she had had one recently. His note said, "May consider outpatient follow-up for cardiology here in [facility] at my office." Last cardiology records requested from [facility] but are still pending. Their recent records show up in [place] and since I don't see any cardiology encounters, I am not anticipating new information. I did leave a voice message for patient to call me so I can clarify her cardiac history. Again, her PCP does not list CAD as a diagnosis for  this patient. Also Her PAT RN says to her recollection, patient denied any cardiac disease. I have not been able to locate a 2011 stress test as of yet. (Update: I spoke with patient, she denied CAD/MI/CHF history. She denied cardiac cath. She said the last time she saw Dr. Saralyn Pilar was likely during her 2011 hospitalization. She thinks 2011 was when her last stress and echo were done. She denied chest pain, SOB. She primarily does her day to day activities and no regular aerobic exercise, but says she can walk up a flight of stairs, do her own shopping without any CV symptoms. She has had some RUE edema and also bilateral ankles following "sprain" injuries. Of note, she is still having some left ankle pain following an injury (dropped a plate on her foot). Reportedly it was xrayed and was negative, but she thinks it looks deformed and may need to be imaged again. I advised the talk with Dr. Amedeo Plenty tomorrow, to at least get his opinion on what the next step should be.   Meds include ASA 325 mg, Fioricet, doxycycline, Neurontin, HCTZ, insulin pump, iron, losartan, Zocor, Zanaflex, Effexor, warfarin, promethazine. Last warfarin dose 05/25/15.  05/27/15 EKG: NSR, cannot rule out anterior infarct (age undetermined). Stable when compared to 10/19/12 tracing.  01/22/10 Echo [facility]): Low normal LVF, estimated EF 50%, mild LVH, LV wall motion is normal, mild to moderate MR/TR.   Preoperative labs noted. PT 17.3, INR 1.41. A1C 6.2.  Case is posted for regional anesthesia, so I'll repeat her PT/INR although I suspect it will be normal or near normal since she has had additional time off warfarin.   If any new records received from [facility]  then I'll update my notes, otherwise she will be evaluated tomorrow by her assigned anesthesiologist. She did have mild-moderate valvular disease on echo 2011, but denied any CV/CHF symptoms. I did advise that she may want to inquire if Dr. Lovie Macadamia would want her echo repeated at some  point in the future since it had now been five years. Her EKG appears overall stable. Suspect repeat PT to be okay. Would anticipate that she could proceed as planned if no acute changes.  George Hugh [facility] Short Stay Center/Anesthesiology Phone [phone] 05/28/2015 1:28 PM  Addendum: Received last cardiology notes. Last visit 08/03/10. No new testing ordered at that time. He was planning to see patient back in six month, however. Patient had told me she was under the impression she didn't need on-going cardiology follow-up. Patient with acute radial fracture and is without CV/CHF symptoms and no known CAD, so I would still anticipate she could proceed as planned in no changes.  George Hugh [facility] Short Stay Center/Anesthesiology Phone [phone] 05/28/2015 4:26 PM

## 2015-05-29 ENCOUNTER — Ambulatory Visit (HOSPITAL_COMMUNITY): Payer: Medicare Other | Admitting: Anesthesiology

## 2015-05-29 ENCOUNTER — Encounter (HOSPITAL_COMMUNITY): Payer: Self-pay | Admitting: *Deleted

## 2015-05-29 ENCOUNTER — Ambulatory Visit (HOSPITAL_COMMUNITY): Payer: Medicare Other | Admitting: Vascular Surgery

## 2015-05-29 ENCOUNTER — Encounter (HOSPITAL_COMMUNITY)
Admission: RE | Disposition: A | Discharge status: Home / Self Care | Payer: Self-pay | Source: Ambulatory Visit | Attending: Orthopedic Surgery

## 2015-05-29 ENCOUNTER — Inpatient Hospital Stay (HOSPITAL_COMMUNITY)
Admission: RE | Admit: 2015-05-29 | Discharge: 2015-05-31 | DRG: 511 | Disposition: A | Discharge status: Home / Self Care | Payer: Medicare Other | Source: Ambulatory Visit | Attending: Orthopedic Surgery | Admitting: Orthopedic Surgery

## 2015-05-29 DIAGNOSIS — E103593 Type 1 diabetes mellitus with proliferative diabetic retinopathy without macular edema, bilateral: Secondary | ICD-10-CM | POA: Insufficient documentation

## 2015-05-29 DIAGNOSIS — Z888 Allergy status to other drugs, medicaments and biological substances status: Secondary | ICD-10-CM

## 2015-05-29 DIAGNOSIS — W19XXXA Unspecified fall, initial encounter: Secondary | ICD-10-CM | POA: Diagnosis present

## 2015-05-29 DIAGNOSIS — S5291XA Unspecified fracture of right forearm, initial encounter for closed fracture: Secondary | ICD-10-CM | POA: Diagnosis present

## 2015-05-29 DIAGNOSIS — E871 Hypo-osmolality and hyponatremia: Secondary | ICD-10-CM | POA: Insufficient documentation

## 2015-05-29 DIAGNOSIS — Z7982 Long term (current) use of aspirin: Secondary | ICD-10-CM

## 2015-05-29 DIAGNOSIS — S92002A Unspecified fracture of left calcaneus, initial encounter for closed fracture: Secondary | ICD-10-CM

## 2015-05-29 DIAGNOSIS — S52501A Unspecified fracture of the lower end of right radius, initial encounter for closed fracture: Principal | ICD-10-CM | POA: Diagnosis present

## 2015-05-29 DIAGNOSIS — F32A Depression, unspecified: Secondary | ICD-10-CM | POA: Insufficient documentation

## 2015-05-29 DIAGNOSIS — F329 Major depressive disorder, single episode, unspecified: Secondary | ICD-10-CM | POA: Insufficient documentation

## 2015-05-29 DIAGNOSIS — M810 Age-related osteoporosis without current pathological fracture: Secondary | ICD-10-CM | POA: Diagnosis present

## 2015-05-29 DIAGNOSIS — Z8673 Personal history of transient ischemic attack (TIA), and cerebral infarction without residual deficits: Secondary | ICD-10-CM

## 2015-05-29 DIAGNOSIS — Z7901 Long term (current) use of anticoagulants: Secondary | ICD-10-CM

## 2015-05-29 DIAGNOSIS — Z9641 Presence of insulin pump (external) (internal): Secondary | ICD-10-CM | POA: Diagnosis present

## 2015-05-29 DIAGNOSIS — E10319 Type 1 diabetes mellitus with unspecified diabetic retinopathy without macular edema: Secondary | ICD-10-CM | POA: Diagnosis present

## 2015-05-29 DIAGNOSIS — Z79899 Other long term (current) drug therapy: Secondary | ICD-10-CM

## 2015-05-29 DIAGNOSIS — I10 Essential (primary) hypertension: Secondary | ICD-10-CM | POA: Insufficient documentation

## 2015-05-29 DIAGNOSIS — K3184 Gastroparesis: Secondary | ICD-10-CM | POA: Diagnosis present

## 2015-05-29 DIAGNOSIS — Z833 Family history of diabetes mellitus: Secondary | ICD-10-CM

## 2015-05-29 DIAGNOSIS — Z23 Encounter for immunization: Secondary | ICD-10-CM

## 2015-05-29 DIAGNOSIS — S52613A Displaced fracture of unspecified ulna styloid process, initial encounter for closed fracture: Secondary | ICD-10-CM | POA: Diagnosis present

## 2015-05-29 DIAGNOSIS — E1043 Type 1 diabetes mellitus with diabetic autonomic (poly)neuropathy: Secondary | ICD-10-CM | POA: Diagnosis present

## 2015-05-29 DIAGNOSIS — Z794 Long term (current) use of insulin: Secondary | ICD-10-CM

## 2015-05-29 DIAGNOSIS — E1065 Type 1 diabetes mellitus with hyperglycemia: Secondary | ICD-10-CM | POA: Diagnosis present

## 2015-05-29 DIAGNOSIS — Z885 Allergy status to narcotic agent status: Secondary | ICD-10-CM

## 2015-05-29 DIAGNOSIS — E785 Hyperlipidemia, unspecified: Secondary | ICD-10-CM | POA: Insufficient documentation

## 2015-05-29 HISTORY — DX: Type 2 diabetes mellitus with diabetic neuropathy, unspecified: E11.40

## 2015-05-29 HISTORY — DX: Dizziness and giddiness: R42

## 2015-05-29 HISTORY — DX: Unspecified convulsions: R56.9

## 2015-05-29 HISTORY — DX: Pure hypercholesterolemia, unspecified: E78.00

## 2015-05-29 HISTORY — DX: Hypothyroidism, unspecified: E03.9

## 2015-05-29 HISTORY — PX: OPEN REDUCTION INTERNAL FIXATION (ORIF) DISTAL RADIAL FRACTURE: SHX5989

## 2015-05-29 HISTORY — DX: Adverse effect of unspecified anesthetic, initial encounter: T41.45XA

## 2015-05-29 HISTORY — DX: History of falling: Z91.81

## 2015-05-29 HISTORY — DX: Age-related osteoporosis without current pathological fracture: M81.0

## 2015-05-29 HISTORY — DX: Type 2 diabetes mellitus with unspecified diabetic retinopathy without macular edema: E11.319

## 2015-05-29 HISTORY — DX: Presence of insulin pump (external) (internal): Z96.41

## 2015-05-29 HISTORY — DX: Slurred speech: R47.81

## 2015-05-29 HISTORY — DX: Presence of spectacles and contact lenses: Z97.3

## 2015-05-29 HISTORY — DX: Other complications of anesthesia, initial encounter: T88.59XA

## 2015-05-29 HISTORY — DX: Gastroparesis: K31.84

## 2015-05-29 HISTORY — DX: Anesthesia of skin: R20.0

## 2015-05-29 HISTORY — DX: Hypotension, unspecified: I95.9

## 2015-05-29 HISTORY — DX: Myalgia, other site: M79.18

## 2015-05-29 HISTORY — DX: Unspecified visual loss: H54.7

## 2015-05-29 HISTORY — DX: Basal cell carcinoma of skin, unspecified: C44.91

## 2015-05-29 HISTORY — DX: Other cervical disc displacement, unspecified cervical region: M50.20

## 2015-05-29 LAB — GLUCOSE, CAPILLARY
GLUCOSE-CAPILLARY: 221 mg/dL — AB (ref 65–99)
GLUCOSE-CAPILLARY: 272 mg/dL — AB (ref 65–99)
GLUCOSE-CAPILLARY: 219 mg/dL — AB (ref 65–99)
GLUCOSE-CAPILLARY: 183 mg/dL — AB (ref 65–99)
Glucose-Capillary: 282 mg/dL — ABNORMAL HIGH (ref 65–99)
Glucose-Capillary: 180 mg/dL — ABNORMAL HIGH (ref 65–99)

## 2015-05-29 SURGERY — OPEN REDUCTION INTERNAL FIXATION (ORIF) DISTAL RADIUS FRACTURE
Anesthesia: Regional | Site: Wrist | Laterality: Right

## 2015-05-29 MED ORDER — MIDAZOLAM HCL 2 MG/2ML IJ SOLN
INTRAMUSCULAR | Status: AC
  Filled 2015-05-29: qty 2

## 2015-05-29 MED ORDER — PHENYLEPHRINE HCL 10 MG/ML IJ SOLN
10.0000 mg | INTRAVENOUS | Status: DC | PRN
  Administered 2015-05-29: 25 ug/min via INTRAVENOUS

## 2015-05-29 MED ORDER — METHOCARBAMOL 1000 MG/10ML IJ SOLN
500.0000 mg | Freq: Four times a day (QID) | INTRAVENOUS | Status: DC | PRN
  Filled 2015-05-29: qty 5

## 2015-05-29 MED ORDER — GABAPENTIN 300 MG PO CAPS
300.0000 mg | ORAL_CAPSULE | Freq: Three times a day (TID) | ORAL | Status: DC
  Administered 2015-05-29 – 2015-05-31 (×6): 300 mg via ORAL
  Filled 2015-05-29 (×6): qty 1

## 2015-05-29 MED ORDER — PHENYLEPHRINE HCL 10 MG/ML IJ SOLN
INTRAMUSCULAR | Status: DC | PRN
  Administered 2015-05-29: 80 ug via INTRAVENOUS
  Administered 2015-05-29: 40 ug via INTRAVENOUS
  Administered 2015-05-29 (×2): 80 ug via INTRAVENOUS

## 2015-05-29 MED ORDER — ADULT MULTIVITAMIN W/MINERALS CH
1.0000 | ORAL_TABLET | Freq: Every evening | ORAL | Status: DC
  Administered 2015-05-29 – 2015-05-30 (×2): 1 via ORAL
  Filled 2015-05-29 (×2): qty 1

## 2015-05-29 MED ORDER — SODIUM CHLORIDE 0.9 % IV SOLN
INTRAVENOUS | Status: DC | PRN
  Administered 2015-05-29 (×2): via INTRAVENOUS

## 2015-05-29 MED ORDER — INFLUENZA VAC SPLIT QUAD 0.5 ML IM SUSY
0.5000 mL | PREFILLED_SYRINGE | INTRAMUSCULAR | Status: AC
  Administered 2015-05-30: 0.5 mL via INTRAMUSCULAR
  Filled 2015-05-29 (×2): qty 0.5

## 2015-05-29 MED ORDER — MORPHINE SULFATE (PF) 2 MG/ML IV SOLN
1.0000 mg | INTRAVENOUS | Status: DC | PRN
  Administered 2015-05-29 (×2): 1 mg via INTRAVENOUS
  Filled 2015-05-29 (×2): qty 1

## 2015-05-29 MED ORDER — LACTATED RINGERS IV SOLN: INTRAVENOUS | Status: DC

## 2015-05-29 MED ORDER — MIDAZOLAM HCL 5 MG/5ML IJ SOLN
INTRAMUSCULAR | Status: DC | PRN
  Administered 2015-05-29: 1 mg via INTRAVENOUS

## 2015-05-29 MED ORDER — SODIUM CHLORIDE 0.45 % IV SOLN: INTRAVENOUS | Status: DC

## 2015-05-29 MED ORDER — METHOCARBAMOL 500 MG PO TABS
500.0000 mg | ORAL_TABLET | Freq: Four times a day (QID) | ORAL | Status: DC | PRN
  Administered 2015-05-29 – 2015-05-30 (×3): 500 mg via ORAL
  Filled 2015-05-29 (×3): qty 1

## 2015-05-29 MED ORDER — VENLAFAXINE HCL 75 MG PO TABS
75.0000 mg | ORAL_TABLET | Freq: Two times a day (BID) | ORAL | Status: DC
  Administered 2015-05-29 – 2015-05-31 (×4): 75 mg via ORAL
  Filled 2015-05-29 (×5): qty 1

## 2015-05-29 MED ORDER — SENNOSIDES-DOCUSATE SODIUM 8.6-50 MG PO TABS: 1.0000 | ORAL_TABLET | Freq: Every evening | ORAL | Status: DC | PRN

## 2015-05-29 MED ORDER — VITAMIN C 500 MG PO TABS
1000.0000 mg | ORAL_TABLET | Freq: Every day | ORAL | Status: DC
  Administered 2015-05-30 – 2015-05-31 (×2): 1000 mg via ORAL
  Filled 2015-05-29 (×3): qty 2

## 2015-05-29 MED ORDER — PROPOFOL 10 MG/ML IV BOLUS
INTRAVENOUS | Status: AC
  Filled 2015-05-29: qty 20

## 2015-05-29 MED ORDER — HYDROCODONE-ACETAMINOPHEN 5-325 MG PO TABS
2.0000 | ORAL_TABLET | ORAL | Status: DC | PRN
  Administered 2015-05-29 – 2015-05-31 (×9): 2 via ORAL
  Filled 2015-05-29 (×9): qty 2

## 2015-05-29 MED ORDER — SIMVASTATIN 20 MG PO TABS
20.0000 mg | ORAL_TABLET | Freq: Every evening | ORAL | Status: DC
  Administered 2015-05-29 – 2015-05-30 (×2): 20 mg via ORAL
  Filled 2015-05-29 (×2): qty 1

## 2015-05-29 MED ORDER — FENTANYL CITRATE (PF) 100 MCG/2ML IJ SOLN
INTRAMUSCULAR | Status: AC
  Filled 2015-05-29: qty 2

## 2015-05-29 MED ORDER — LOSARTAN POTASSIUM 50 MG PO TABS
100.0000 mg | ORAL_TABLET | Freq: Every day | ORAL | Status: DC
  Administered 2015-05-29 – 2015-05-30 (×2): 100 mg via ORAL
  Filled 2015-05-29 (×2): qty 2

## 2015-05-29 MED ORDER — ONDANSETRON HCL 4 MG/2ML IJ SOLN
INTRAMUSCULAR | Status: DC | PRN
  Administered 2015-05-29: 4 mg via INTRAVENOUS

## 2015-05-29 MED ORDER — FAMOTIDINE 20 MG PO TABS: 20.0000 mg | ORAL_TABLET | Freq: Two times a day (BID) | ORAL | Status: DC | PRN

## 2015-05-29 MED ORDER — PROMETHAZINE HCL 25 MG/ML IJ SOLN: 6.2500 mg | INTRAMUSCULAR | Status: DC | PRN

## 2015-05-29 MED ORDER — LIDOCAINE-EPINEPHRINE (PF) 1.5 %-1:200000 IJ SOLN
INTRAMUSCULAR | Status: DC | PRN
  Administered 2015-05-29: 15 mL via PERINEURAL

## 2015-05-29 MED ORDER — FENTANYL CITRATE (PF) 250 MCG/5ML IJ SOLN
INTRAMUSCULAR | Status: AC
  Filled 2015-05-29: qty 5

## 2015-05-29 MED ORDER — CEFAZOLIN SODIUM 1-5 GM-% IV SOLN
1.0000 g | Freq: Three times a day (TID) | INTRAVENOUS | Status: DC
  Administered 2015-05-29 – 2015-05-31 (×5): 1 g via INTRAVENOUS
  Filled 2015-05-29 (×7): qty 50

## 2015-05-29 MED ORDER — BUPIVACAINE HCL (PF) 0.5 % IJ SOLN
INTRAMUSCULAR | Status: DC | PRN
  Administered 2015-05-29: 20 mL via PERINEURAL

## 2015-05-29 MED ORDER — LOSARTAN POTASSIUM 50 MG PO TABS: 100.0000 mg | ORAL_TABLET | Freq: Every evening | ORAL | Status: DC

## 2015-05-29 MED ORDER — ONDANSETRON HCL 4 MG/2ML IJ SOLN: 4.0000 mg | Freq: Four times a day (QID) | INTRAMUSCULAR | Status: DC | PRN

## 2015-05-29 MED ORDER — FENTANYL CITRATE (PF) 100 MCG/2ML IJ SOLN: 25.0000 ug | INTRAMUSCULAR | Status: DC | PRN

## 2015-05-29 MED ORDER — CEFAZOLIN SODIUM-DEXTROSE 2-3 GM-% IV SOLR
2.0000 g | INTRAVENOUS | Status: AC
  Administered 2015-05-29: 2 g via INTRAVENOUS
  Filled 2015-05-29: qty 50

## 2015-05-29 MED ORDER — MIDAZOLAM HCL 2 MG/2ML IJ SOLN
INTRAMUSCULAR | Status: AC
  Filled 2015-05-29: qty 4

## 2015-05-29 MED ORDER — INSULIN PUMP
SUBCUTANEOUS | Status: DC
  Administered 2015-05-29: 21:00:00 via SUBCUTANEOUS
  Administered 2015-05-29: 1.7 via SUBCUTANEOUS
  Administered 2015-05-30 (×4): via SUBCUTANEOUS
  Filled 2015-05-29: qty 1

## 2015-05-29 MED ORDER — PROPOFOL 10 MG/ML IV BOLUS
INTRAVENOUS | Status: DC | PRN
  Administered 2015-05-29: 100 mg via INTRAVENOUS
  Administered 2015-05-29: 50 mg via INTRAVENOUS
  Administered 2015-05-29: 20 mg via INTRAVENOUS

## 2015-05-29 MED ORDER — TRAMADOL HCL 50 MG PO TABS
50.0000 mg | ORAL_TABLET | Freq: Four times a day (QID) | ORAL | Status: DC | PRN
  Administered 2015-05-29 – 2015-05-30 (×2): 50 mg via ORAL
  Filled 2015-05-29 (×2): qty 1

## 2015-05-29 MED ORDER — 0.9 % SODIUM CHLORIDE (POUR BTL) OPTIME
TOPICAL | Status: DC | PRN
  Administered 2015-05-29: 1000 mL

## 2015-05-29 MED ORDER — ONDANSETRON HCL 4 MG PO TABS: 4.0000 mg | ORAL_TABLET | Freq: Four times a day (QID) | ORAL | Status: DC | PRN

## 2015-05-29 MED ORDER — CHLORHEXIDINE GLUCONATE 4 % EX LIQD: 60.0000 mL | Freq: Once | CUTANEOUS | Status: DC

## 2015-05-29 MED ORDER — HYDROCHLOROTHIAZIDE 12.5 MG PO CAPS
12.5000 mg | ORAL_CAPSULE | Freq: Every day | ORAL | Status: DC
  Administered 2015-05-30 – 2015-05-31 (×2): 12.5 mg via ORAL
  Filled 2015-05-29 (×2): qty 1

## 2015-05-29 MED ORDER — INSULIN PUMP: 0.4000 | SUBCUTANEOUS | Status: DC

## 2015-05-29 MED ORDER — FENTANYL CITRATE (PF) 100 MCG/2ML IJ SOLN
INTRAMUSCULAR | Status: DC | PRN
  Administered 2015-05-29 (×3): 50 ug via INTRAVENOUS

## 2015-05-29 MED ORDER — LIDOCAINE HCL (CARDIAC) 20 MG/ML IV SOLN
INTRAVENOUS | Status: DC | PRN
  Administered 2015-05-29: 50 mg via INTRAVENOUS

## 2015-05-29 MED ORDER — ALPRAZOLAM 0.5 MG PO TABS
0.5000 mg | ORAL_TABLET | Freq: Four times a day (QID) | ORAL | Status: DC | PRN
  Administered 2015-05-29: 0.5 mg via ORAL
  Filled 2015-05-29: qty 1

## 2015-05-29 SURGICAL SUPPLY — 60 items
BANDAGE ELASTIC 3 VELCRO ST LF (GAUZE/BANDAGES/DRESSINGS) ×3 IMPLANT
BANDAGE ELASTIC 4 VELCRO ST LF (GAUZE/BANDAGES/DRESSINGS) ×3 IMPLANT
BIT DRILL 2.2 SS TIBIAL (BIT) ×3 IMPLANT
BLADE SURG ROTATE 9660 (MISCELLANEOUS) IMPLANT
BNDG ESMARK 4X9 LF (GAUZE/BANDAGES/DRESSINGS) ×3 IMPLANT
BNDG GAUZE ELAST 4 BULKY (GAUZE/BANDAGES/DRESSINGS) ×3 IMPLANT
CORDS BIPOLAR (ELECTRODE) ×3 IMPLANT
COVER SURGICAL LIGHT HANDLE (MISCELLANEOUS) ×3 IMPLANT
CUFF TOURNIQUET SINGLE 18IN (TOURNIQUET CUFF) ×3 IMPLANT
CUFF TOURNIQUET SINGLE 24IN (TOURNIQUET CUFF) IMPLANT
DECANTER SPIKE VIAL GLASS SM (MISCELLANEOUS) IMPLANT
DRAIN TLS ROUND 10FR (DRAIN) IMPLANT
DRAPE OEC MINIVIEW 54X84 (DRAPES) IMPLANT
DRAPE U-SHAPE 47X51 STRL (DRAPES) ×3 IMPLANT
DRSG ADAPTIC 3X8 NADH LF (GAUZE/BANDAGES/DRESSINGS) ×3 IMPLANT
GAUZE SPONGE 4X4 12PLY STRL (GAUZE/BANDAGES/DRESSINGS) ×3 IMPLANT
GAUZE XEROFORM 5X9 LF (GAUZE/BANDAGES/DRESSINGS) ×3 IMPLANT
GLOVE ORTHO TXT STRL SZ7.5 (GLOVE) ×3 IMPLANT
GLOVE SS BIOGEL STRL SZ 8 (GLOVE) ×1 IMPLANT
GLOVE SUPERSENSE BIOGEL SZ 8 (GLOVE) ×2
GOWN STRL REUS W/ TWL LRG LVL3 (GOWN DISPOSABLE) ×3 IMPLANT
GOWN STRL REUS W/ TWL XL LVL3 (GOWN DISPOSABLE) ×3 IMPLANT
GOWN STRL REUS W/TWL LRG LVL3 (GOWN DISPOSABLE) ×6
GOWN STRL REUS W/TWL XL LVL3 (GOWN DISPOSABLE) ×6
KIT BASIN OR (CUSTOM PROCEDURE TRAY) ×3 IMPLANT
KIT ROOM TURNOVER OR (KITS) ×3 IMPLANT
LOOP VESSEL MAXI BLUE (MISCELLANEOUS) IMPLANT
MANIFOLD NEPTUNE II (INSTRUMENTS) ×3 IMPLANT
NEEDLE 22X1 1/2 (OR ONLY) (NEEDLE) IMPLANT
NS IRRIG 1000ML POUR BTL (IV SOLUTION) ×3 IMPLANT
PACK ORTHO EXTREMITY (CUSTOM PROCEDURE TRAY) ×3 IMPLANT
PAD ARMBOARD 7.5X6 YLW CONV (MISCELLANEOUS) ×6 IMPLANT
PAD CAST 4YDX4 CTTN HI CHSV (CAST SUPPLIES) ×1 IMPLANT
PADDING CAST COTTON 4X4 STRL (CAST SUPPLIES) ×2
PEG LOCKING SMOOTH 2.2X18 (Peg) ×9 IMPLANT
PEG LOCKING SMOOTH 2.2X20 (Screw) ×3 IMPLANT
PEG LOCKING SMOOTH 2.2X22 (Screw) ×3 IMPLANT
PLATE NARROW DVR RIGHT (Plate) ×3 IMPLANT
PUTTY DBM STAGRAFT PLUS 5CC (Putty) ×3 IMPLANT
SCREW LOCK 12X2.7X 3 LD (Screw) ×1 IMPLANT
SCREW LOCK 14X2.7X 3 LD TPR (Screw) ×2 IMPLANT
SCREW LOCKING 2.7X11MM (Screw) ×3 IMPLANT
SCREW LOCKING 2.7X12MM (Screw) ×2 IMPLANT
SCREW LOCKING 2.7X13MM (Screw) ×3 IMPLANT
SCREW LOCKING 2.7X14 (Screw) ×4 IMPLANT
SCREW LOCKING 2.7X15MM (Screw) ×3 IMPLANT
SPLINT FIBERGLASS 3X12 (CAST SUPPLIES) ×3 IMPLANT
SPONGE LAP 4X18 X RAY DECT (DISPOSABLE) IMPLANT
SUT MNCRL AB 4-0 PS2 18 (SUTURE) ×3 IMPLANT
SUT PROLENE 3 0 PS 2 (SUTURE) ×3 IMPLANT
SUT VIC AB 3-0 FS2 27 (SUTURE) IMPLANT
SYR CONTROL 10ML LL (SYRINGE) IMPLANT
SYSTEM CHEST DRAIN TLS 7FR (DRAIN) IMPLANT
TOWEL OR 17X24 6PK STRL BLUE (TOWEL DISPOSABLE) ×3 IMPLANT
TOWEL OR 17X26 10 PK STRL BLUE (TOWEL DISPOSABLE) ×3 IMPLANT
TUBE CONNECTING 12'X1/4 (SUCTIONS) ×1
TUBE CONNECTING 12X1/4 (SUCTIONS) ×2 IMPLANT
TUBE EVACUATION TLS (MISCELLANEOUS) ×3 IMPLANT
UNDERPAD 30X30 INCONTINENT (UNDERPADS AND DIAPERS) ×3 IMPLANT
WATER STERILE IRR 1000ML POUR (IV SOLUTION) IMPLANT

## 2015-05-29 NOTE — Anesthesia Procedure Notes (Addendum)
Anesthesia Regional Block:  Interscalene brachial plexus block  Pre-Anesthetic Checklist: ,, timeout performed, Correct Patient, Correct Site, Correct Laterality, Correct Procedure, Correct Position, site marked, Risks and benefits discussed,  Surgical consent,  Pre-op evaluation,  At surgeon's request and post-op pain management  Laterality: Right  Prep: chloraprep       Needles:  Injection technique: Single-shot  Needle Type: Echogenic Needle     Needle Length: 9cm 9 cm Needle Gauge: 21 and 21 G    Additional Needles:  Procedures: ultrasound guided (picture in chart) Interscalene brachial plexus block Narrative:  Injection made incrementally with aspirations every 5 mL.  Performed by: Personally  Anesthesiologist: ROSE, GEORGE  Additional Notes: Patient tolerated the procedure well without complications   Procedure Name: LMA Insertion Date/Time: 05/29/2015 10:35 AM Performed by: Clearnce Sorrel Pre-anesthesia Checklist: Patient identified, Timeout performed, Emergency Drugs available, Suction available and Patient being monitored Patient Re-evaluated:Patient Re-evaluated prior to inductionOxygen Delivery Method: Circle system utilized Preoxygenation: Pre-oxygenation with 100% oxygen Intubation Type: IV induction Ventilation: Mask ventilation without difficulty LMA: LMA inserted LMA Size: 3.0 Number of attempts: 2 Placement Confirmation: breath sounds checked- equal and bilateral and positive ETCO2 Tube secured with: Tape Dental Injury: Teeth and Oropharynx as per pre-operative assessment

## 2015-05-29 NOTE — Progress Notes (Signed)
Called by Short Stay RN Henrine Screws, RN) regarding surgery on patient with Type 1 diabetes and using an insulin pump. Patient is to have wrist surgery that will last about 1 1/2 hours and then will be admitted. Patient has already been sedated. In reviewing the chart, noted patient is followed by [facility] Endocrinology and saw Dr. Frederik Pear on 03/11/2015. According to Dr. Jimmy Picket office note, patient was diagnosed with DM1 in 1968 and has been on an insulin pump since 1997. Patient currently uses an [place]. According to Henrine Screws, RN in short stay patient has insulin pump attached and she reduced all her insulin pump setting by 20% prior to coming in today. Patient's glucose was 221 mg/dl at 7:28 am but no correction was done (per MD request).   According to Dr. Jimmy Picket office note on 03/11/15, the following were patient's insulin pump settings (prior to 20% reduction)  Basal insulin  12A 0.275 units/hour 5A 0.375 units/hour 9A 0.3 units/hour 12N 0.375 units/hour 4P 0.3 units/hour 7P 0.2 units/hour Total daily basal insulin:7.45 units/24 hours  Carb Coverage 1:12 at breakfast (1 unit for every 12 grams of carbohydrates) 1:20 lunch (1 unit for every 20 grams of carbohydrates) 1:20 snacks in the afternoon and mid am (1 unit for every 20 grams of carbohydrates) 1:25 supper and bedtime (1 unit for every 25 grams of carbohydrates)  Insulin Sensitivity 1:100 1 unit drops blood glucose 100 mg/dl  A1C was 5.9% on 03/11/15 and Dr. Frederik Pear notes that patient's glucose is too tight and unsafe due to risk of hypoglycemia.   Spoke with Dr. Kalman Shan to ensure he is aware that patient is very sensitive to insulin with 1 unit dropping glucose 100 mg/dl. If patient will be admitted and allowed to use her insulin pump, MD will need to order the Insulin Pump Order Set. Patient will need to have extra insulin pump supplies at the bedside as site will need to be changed out every 72 hours. Also, patient  will need to complete the insulin pump flowsheet and contract (links in insulin pump order set). Once the patient is awake and alert in PACU, please have her check glucose and administer a correction if needed. Diabetes Coordinator will continue to follow along. Please page with any further questions or concerns.  Thanks, Barnie Alderman, RN, MSN, CCRN, CDE Diabetes Coordinator Inpatient Diabetes Program [phone] (Team Pager from [place] to [place]) [phone] ([facility] office) [phone] [facility] office) [phone] [facility] office)

## 2015-05-29 NOTE — Progress Notes (Signed)
Patient alert and oriented x 4. CBG recheck at 282. Dr. Kalman Shan notified patient gave self a 1 unit bolus of insulin through pump per Dr. Kalman Shan. Per my request patient also verified that site was in place/ intact.

## 2015-05-29 NOTE — Op Note (Signed)
See dictation#[id number] SP ORIF Rt Radius Fx Allisyn Kunz MD

## 2015-05-29 NOTE — Evaluation (Addendum)
Occupational Therapy Evaluation Patient Details Name: Erin Daniels MRN: [id number] DOB: 06/01/1946 Today's Date: 05/29/2015    History of Present Illness 69 y.o. s/p  ORIF right wrist due to a fall. History of CVAs.   Clinical Impression   Pt s/p above. Pt independent with ADLs, PTA. Feel pt will benefit from acute OT to increase independence and address RUE prior to d/c.    Follow Up Recommendations  No OT follow up;Supervision - Intermittent    Equipment Recommendations    TBD   Recommendations for Other Services       Precautions / Restrictions Precautions Precaution Comments: no pushing, pulling, lifting with RUE (can use to hold light items) Restrictions Weight Bearing Restrictions: Yes RUE Weight Bearing: Weight bear through elbow only      Mobility Bed Mobility Overal bed mobility: Needs Assistance Bed Mobility: Supine to Sit;Sit to Supine     Supine to sit: Min assist Sit to supine: Supervision      Transfers Overall transfer level: Needs assistance   Transfers: Sit to/from Stand Sit to Stand/Stand to Sit: Min assist         General transfer comment: Min guard for sit to stand; Min assist 1x for stand to sit to [facility]    Balance Overall balance assessment:  (History of fall)    Reports decreased balance at baseline.                                      ADL Overall ADL's : Needs assistance/impaired                     Lower Body Dressing: Minimal assistance;Sit to/from stand   Toilet Transfer: Minimal assistance;Ambulation;BSC (Min assist for stand to sit to [facility]; Min guard-ambulation)   Toileting- Water quality scientist and Hygiene: Minimal assistance;Sit to/from stand       Functional mobility during ADLs: Min guard General ADL Comments: Educated on UB dressing technique and discussed UB clothing. Educated on edema management techniques for RUE and elevated at end of session.     Vision     Perception      Praxis      Pertinent Vitals/Pain Pain Assessment: 0-10 Pain Score: 9  Pain Location: RUE, throat, and left foot Pain Descriptors / Indicators: Sore Pain Intervention(s): Repositioned;Monitored during session;Patient requesting pain meds-RN notified     Hand Dominance Right   Extremity/Trunk Assessment Upper Extremity Assessment Upper Extremity Assessment: RUE deficits/detail RUE Deficits / Details: difficulty with AROM shoulder flexion; pain when moving digits RUE: Unable to fully assess due to immobilization RUE Coordination: decreased fine motor   Lower Extremity Assessment Lower Extremity Assessment: Defer to PT evaluation       Communication Communication Communication: Expressive difficulties   Cognition Arousal/Alertness: Awake/alert Behavior During Therapy: WFL for tasks assessed/performed Overall Cognitive Status: Within Functional Limits for tasks assessed                     General Comments       Exercises  Pt moved right digits-painful; OT educated/demonstrated on retrograde massage     Shoulder Instructions      Home Living Family/patient expects to be discharged to:: Private residence Living Arrangements: Spouse/significant other Available Help at Discharge: Family;Available PRN/intermittently Type of Home: House Home Access: Stairs to enter [employer] of Steps: 3 Entrance Stairs-Rails: Left Home Layout: One level  Bathroom Shower/Tub: Occupational psychologist: Standard     Home Equipment: Cane - single point;Walker - 2 wheels          Prior Functioning/Environment Level of Independence: Needs assistance  Gait / Transfers Assistance Needed: held to spouse's hand when out ADL's / Homemaking Assistance Needed: independent with dressing/bathing/cooking/cleaning        OT Diagnosis: Acute pain   OT Problem List: Decreased strength;Pain;Increased edema;Impaired UE functional use;Decreased knowledge of  precautions;Decreased knowledge of use of DME or AE;Decreased coordination;Impaired balance (sitting and/or standing);Decreased activity tolerance;Decreased range of motion   OT Treatment/Interventions: Self-care/ADL training;Therapeutic exercise;DME and/or AE instruction;Therapeutic activities;Patient/family education;Balance training    OT Goals(Current goals can be found in the care plan section) Acute Rehab OT Goals Patient Stated Goal: not stated OT Goal Formulation: With patient Time For Goal Achievement: 06/05/15 Potential to Achieve Goals: Good ADL Goals Pt Will Perform Lower Body Dressing: with set-up;sit to/from stand Pt Will Transfer to Toilet: with modified independence;ambulating Pt Will Perform Toileting - Clothing Manipulation and hygiene: sit to/from stand;with modified independence Additional ADL Goal #1: Pt will independently verbalize and demonstrate 3 edema management techniques for RUE.  OT Frequency: Min 2X/week   Barriers to D/C:            Co-evaluation              End of Session Equipment Utilized During Treatment: Gait belt; Oxygen placed back on towards end of session. Nurse Communication: Patient requests pain meds; spoke with tech about getting pt ice for RUE and pillowcase to position one more pillow under RUE  Activity Tolerance: Patient limited by pain Patient left: in bed;with call bell/phone within reach;with family/visitor present   Time: 3833-3832 OT Time Calculation (min): 20 min Charges:  OT General Charges $OT Visit: 1 Procedure OT Evaluation $Initial OT Evaluation Tier I: 1 Procedure G-Codes: OT G-codes **NOT FOR INPATIENT CLASS** Functional Assessment Tool Used: clinical judgment Functional Limitation: Self care Self Care Current Status (N1916): At least 20 percent but less than 40 percent impaired, limited or restricted Self Care Goal Status (O0600): At least 1 percent but less than 20 percent impaired, limited or restricted   Benito Mccreedy OTR/L [phone] 05/29/2015, 4:00 PM

## 2015-05-29 NOTE — Transfer of Care (Signed)
Immediate Anesthesia Transfer of Care Note  Patient: Erin Daniels  Procedure(s) Performed: Procedure(s): OPEN REDUCTION INTERNAL FIXATION (ORIF) RIGHT DISTAL RADIUS FRACTURE WITH ALLOGRAFT BONE GRAFTING FOR REPAIR AND RECONSTRUCTION (Right)  Patient Location: PACU  Anesthesia Type:General and Regional  Level of Consciousness: awake, alert  and oriented  Airway & Oxygen Therapy: Patient Spontanous Breathing and Patient connected to nasal cannula oxygen  Post-op Assessment: Report given to RN and Post -op Vital signs reviewed and stable  Post vital signs: Reviewed and stable  Last Vitals:  Filed Vitals:   05/29/15 0742  BP: 127/51  Pulse: 84  Temp: 37 C  Resp: 16    Complications: No apparent anesthesia complications

## 2015-05-29 NOTE — Progress Notes (Signed)
Patient is at 80% of rate on insulin pump. Dr. Kalman Shan notified of CBG on arrival. Patient states she has been in the 200's since injury where she normally runs 70- 90's on pump. Confirmed with patient and with endocrinology notes from [place] that patient is Type 1 DM states onset at age 69 after giving birth. Reports normally checks blood sugar 8- 9 times a day and boluses for meals. Notified DM coordinator.

## 2015-05-29 NOTE — Anesthesia Preprocedure Evaluation (Signed)
Anesthesia Evaluation  Patient identified by MRN, date of birth, ID band Patient awake    Reviewed: Allergy & Precautions, NPO status , Patient's Chart, lab work & pertinent test results  Airway Mallampati: II  TM Distance: >3 FB Neck ROM: Full    Dental no notable dental hx.    Pulmonary neg pulmonary ROS,    Pulmonary exam normal breath sounds clear to auscultation       Cardiovascular hypertension, Pt. on medications Normal cardiovascular exam Rhythm:Regular Rate:Normal     Neuro/Psych Seizures -,  CVA negative psych ROS   GI/Hepatic negative GI ROS, Neg liver ROS,   Endo/Other  diabetes, Insulin DependentHypothyroidism   Renal/GU negative Renal ROS  negative genitourinary   Musculoskeletal negative musculoskeletal ROS (+)   Abdominal   Peds negative pediatric ROS (+)  Hematology negative hematology ROS (+)   Anesthesia Other Findings   Reproductive/Obstetrics negative OB ROS                             Anesthesia Physical Anesthesia Plan  ASA: III  Anesthesia Plan: General   Post-op Pain Management: GA combined w/ Regional for post-op pain   Induction: Intravenous  Airway Management Planned: LMA  Additional Equipment:   Intra-op Plan:   Post-operative Plan: Extubation in OR  Informed Consent: I have reviewed the patients History and Physical, chart, labs and discussed the procedure including the risks, benefits and alternatives for the proposed anesthesia with the patient or authorized representative who has indicated his/her understanding and acceptance.   Dental advisory given  Plan Discussed with: CRNA and Surgeon  Anesthesia Plan Comments:         Anesthesia Quick Evaluation

## 2015-05-29 NOTE — H&P (Signed)
Erin Daniels is an 69 y.o. female.   Chief Complaint: Right wrist fracture plan operative treatment HPI: Patient presents for ORIF right wrist  Patient presents for evaluation and treatment of the of their upper extremity predicament. The patient denies neck, back, chest or  abdominal pain. The patient notes that they have no lower extremity problems. The patients primary complaint is noted. We are planning surgical care pathway for the upper extremity.  Past Medical History  Diagnosis Date  . Hypertension   . Myofascial muscle pain   . Hypercholesteremia   . Hypothyroidism   . Osteoporosis   . Gastroparesis   . Diabetic retinopathy ([place])   . Stroke (HCC)     x5  . Dizziness   . Slurred speech   . Numbness of arm     left  . Basal cell carcinoma     right upper leg  . Poor vision   . Wears glasses   . Diabetes mellitus without complication (HCC)     Type I  . Insulin pump in place   . Herniated disc, cervical   . Vertigo   . Hypotensive episode   . At high risk for falls   . Complication of anesthesia     difficult to arouse  . Seizures ([place])   . Diabetic neuropathy [facility])     Past Surgical History  Procedure Laterality Date  . Tonsillectomy    . Hemorroidectomy    . Hernia repair    . Joint replacement    . Appendectomy    . Tubal ligation    . Eye surgery    . Trigger finger release    . Bilateral carpal tunnel release    . Synovectomy    . Colonoscopy    . Shoulder arthroscopy Left   . Shoulder hemi-arthroplasty Left     History reviewed. No pertinent family history. Social History:  reports that she has never smoked. She has never used smokeless tobacco. She reports that she does not drink alcohol or use illicit drugs.  Allergies:  Allergies  Allergen Reactions  . Codeine Nausea Only    Can take Vicodin and Percocet   . Meperidine Nausea Only  . Metoclopramide Hcl Nausea Only  . Oxycodone Nausea Only  . Stadol  [Butorphanol] Nausea Only  .  Tussionex Pennkinetic Er [Hydrocod Polst-Cpm Polst Er] Other (See Comments)    Filled patient with sugar   . Carbocaine  [Mepivacaine Hcl] Palpitations    Medications Prior to Admission  Medication Sig Dispense Refill  . Ascorbic Acid (VITAMIN C PO) Take 1 tablet by mouth daily with breakfast.    . aspirin 325 MG EC tablet Take 325 mg by mouth daily with breakfast.    . butalbital-acetaminophen-caffeine (FIORICET, ESGIC) 50-325-40 MG tablet Take 0.5-1 tablets by mouth 3 (three) times daily as needed for headache.    . Calcium Carb-Cholecalciferol (CALCIUM 600 + D PO) Take 1 tablet by mouth every evening.    . carboxymethylcellulose (REFRESH PLUS) 0.5 % SOLN Take 1-2 drops by mouth daily after breakfast.    . doxycycline (ORACEA) 40 MG capsule Take 40 mg by mouth every evening.    . folic acid (FOLVITE) 1 MG tablet Take 1 mg by mouth daily.    Marland Kitchen gabapentin (NEURONTIN) 300 MG capsule Take 300 mg by mouth 3 (three) times daily. Takes in the morning, at dinner, and at bedtime    . hydrochlorothiazide (MICROZIDE) 12.5 MG capsule Take 12.5 mg by mouth  daily with breakfast.    . Insulin Human (INSULIN PUMP) SOLN Inject 0.4-4 each into the skin continuous. Uses Novolog    . IRON, FERROUS GLUCONATE, PO Take 1 tablet by mouth every evening.    Marland Kitchen losartan (COZAAR) 100 MG tablet Take 100 mg by mouth every evening.    . Multiple Vitamin (MULTIVITAMIN WITH MINERALS) TABS tablet Take 1 tablet by mouth every evening.    . polyethylene glycol (MIRALAX / GLYCOLAX) packet Take 17 g by mouth every other day.    . simvastatin (ZOCOR) 20 MG tablet Take 20 mg by mouth every evening.    Marland Kitchen tiZANidine (ZANAFLEX) 2 MG tablet Take 2 mg by mouth at bedtime.    . traMADol (ULTRAM) 50 MG tablet Take 50 mg by mouth every 6 (six) hours as needed.    . venlafaxine (EFFEXOR) 75 MG tablet Take 75 mg by mouth 2 (two) times daily.    Marland Kitchen VITAMIN E PO Take 1 tablet by mouth every evening.    . warfarin (COUMADIN) 5 MG tablet Take  10 mg by mouth daily at 6 PM.    . oxyCODONE (ROXICODONE) 5 MG immediate release tablet Take 1 tablet (5 mg total) by mouth every 6 (six) hours as needed for severe pain. 15 tablet 0  . promethazine (PHENERGAN) 25 MG suppository Place 25 mg rectally every 6 (six) hours as needed for nausea or vomiting.      Results for orders placed or performed during the hospital encounter of 05/29/15 (from the past 48 hour(s))  Basic metabolic panel     Status: Abnormal   Collection Time: 05/27/15  4:11 PM  Result Value Ref Range   Sodium 135 135 - 145 mmol/L   Potassium 3.9 3.5 - 5.1 mmol/L   Chloride 94 (L) 101 - 111 mmol/L   CO2 33 (H) 22 - 32 mmol/L   Glucose, Bld 140 (H) 65 - 99 mg/dL   BUN 18 6 - 20 mg/dL   Creatinine, Ser 0.38 (L) 0.44 - 1.00 mg/dL   Calcium 9.5 8.9 - 10.3 mg/dL   GFR calc non Af Amer >60 >60 mL/min   GFR calc Af Amer >60 >60 mL/min    Comment: (NOTE) The eGFR has been calculated using the CKD EPI equation. This calculation has not been validated in all clinical situations. eGFR's persistently <60 mL/min signify possible Chronic Kidney Disease.    Anion gap 8 5 - 15  CBC     Status: Abnormal   Collection Time: 05/27/15  4:11 PM  Result Value Ref Range   WBC 6.8 4.0 - 10.5 K/uL   RBC 3.51 (L) 3.87 - 5.11 MIL/uL   Hemoglobin 10.9 (L) 12.0 - 15.0 g/dL   HCT 32.7 (L) 36.0 - 46.0 %   MCV 93.2 78.0 - 100.0 fL   MCH 31.1 26.0 - 34.0 pg   MCHC 33.3 30.0 - 36.0 g/dL   RDW 12.1 11.5 - 15.5 %   Platelets 219 150 - 400 K/uL  PT- INR at PAT visit (Pre-admission Testing)     Status: Abnormal   Collection Time: 05/27/15  4:11 PM  Result Value Ref Range   Prothrombin Time 17.3 (H) 11.6 - 15.2 seconds   INR 1.41 0.00 - 1.49  Hemoglobin A1c     Status: Abnormal   Collection Time: 05/27/15  4:12 PM  Result Value Ref Range   Hgb A1c MFr Bld 6.2 (H) 4.8 - 5.6 %    Comment: (NOTE)  Pre-diabetes: 5.7 - 6.4         Diabetes: >6.4         Glycemic control for adults with  diabetes: <7.0    Mean Plasma Glucose 131 mg/dL    Comment: (NOTE) Performed At: [facility] [place], [place] [id number] Lindon Romp MD RC:[id number]   Glucose, capillary     Status: Abnormal   Collection Time: 05/29/15  7:28 AM  Result Value Ref Range   Glucose-Capillary 221 (H) 65 - 99 mg/dL   Comment 1 Call MD NNP PA CNM    Comment 2 Document in Chart   Glucose, capillary     Status: Abnormal   Collection Time: 05/29/15  9:38 AM  Result Value Ref Range   Glucose-Capillary 272 (H) 65 - 99 mg/dL  Glucose, capillary     Status: Abnormal   Collection Time: 05/29/15 10:18 AM  Result Value Ref Range   Glucose-Capillary 282 (H) 65 - 99 mg/dL   Comment 1 Call MD NNP PA CNM    Comment 2 Document in Chart    No results found.  ROS  Blood pressure 127/51, pulse 84, temperature 98.6 F (37 C), temperature source Oral, resp. rate 16, SpO2 97 %. Physical Exam  Displaced right distal radius fracture neurovascular intact no signs of infection or dystrophy.  No signs of compartment syndrome.  Left ankle has swelling and pain over the anterior talofibular ligament. She has pain over the fibula and Achilles region as well. Initial x-rays prior to her fall at her primary care physicians were negative  The patient is alert and oriented in no acute distress. The patient complains of pain in the affected upper extremity.  The patient is noted to have a normal HEENT exam. Lung fields show equal chest expansion and no shortness of breath. Abdomen exam is nontender without distention. Lower extremity examination does not show any fracture dislocation or blood clot symptoms. Pelvis is stable and the neck and back are stable and nontender. Assessment/Plan Plan ORIF right wrist with reconstruction is necessary.  We'll also plan fluoroscopy of her left ankle.  We are planning surgery for your upper extremity. The risk and benefits of surgery to include risk of  bleeding, infection, anesthesia,  damage to normal structures and failure of the surgery to accomplish its intended goals of relieving symptoms and restoring function have been discussed in detail. With this in mind we plan to proceed. I have specifically discussed with the patient the pre-and postoperative regime and the dos and don'ts and risk and benefits in great detail. Risk and benefits of surgery also include risk of dystrophy(CRPS), chronic nerve pain, failure of the healing process to go onto completion and other inherent risks of surgery The relavent the pathophysiology of the disease/injury process, as well as the alternatives for treatment and postoperative course of action has been discussed in great detail with the patient who desires to proceed.  We will do everything in our power to help you (the patient) restore function to the upper extremity. It is a pleasure to see this patient today.   Deosha Werden III,Leroy Pettway M 05/29/2015, 10:29 AM

## 2015-05-29 NOTE — Progress Notes (Signed)
Per Dr. Kalman Shan do not repeat PT/ INR

## 2015-05-29 NOTE — Progress Notes (Addendum)
Patient CBG noted at 272 per Dr. Kalman Shan ok for patient to give herself a bolus of 1 unit. Patient give self 1 unit bolus. Prior to same patient alert and oriented x 4

## 2015-05-29 NOTE — Op Note (Signed)
NAMEMarland Daniels  [name], DETTMANN NO.:  [id number]  MEDICAL RECORD NO.:  [id number]  LOCATION:  5N20C                        FACILITY:  [facility]  PHYSICIAN:  Satira Anis. Elzena Muston, M.D.DATE OF BIRTH:  1946-08-13  DATE OF PROCEDURE: DATE OF DISCHARGE:                              OPERATIVE REPORT   PREOPERATIVE DIAGNOSIS:  Right comminuted complex greater than 3-part intra-articular distal radius fracture with associated ulnar styloid fracture.  POSTOPERATIVE DIAGNOSIS:  Right comminuted complex greater than 3-part intra-articular distal radius fracture with associated ulnar styloid fracture.  PROCEDURE: 1. Open reduction and internal fixation, greater than 3-part intra-     articular comminuted distal radius fracture with 5 mL of stay     graft, bone graft, and a plate and screw construct from [employer] (DVR     plate) utilized. 2. AP, lateral, and oblique x-rays performed, examined, and     interpreted by myself, right wrist. 3. Closed treatment ulnar styloid fracture right wrist. 4. 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  SURGEON:  Satira Anis. Amedeo Plenty, M.D.  ASSISTANT:  None.  COMPLICATIONS:  None.  ANESTHESIA:  General.  INDICATIONS:  The patient is a pleasant female presents with the above- mentioned diagnosis.  I have counseled her about the risks and benefits of surgery including risk of infection, bleeding, anesthesia, damage to normal structures, and failure of surgery to accomplish its intended goals of relieving symptoms and restoring function.  With this in mind, she desires to proceed.  All questions have been encouraged and answered preoperatively.  OPERATIVE PROCEDURE:  The patient was seen by myself and Anesthesia, taken to the operative suite, underwent smooth induction of general anesthesia.  Preoperatively, she was counseled in the operative arena, time-out was  observed.  Pre and postop check was complete.  Preoperative antibiotics were given in the arm, was carefully removed from the splint.  I then performed personally Hibiclens scrub as a pre-scrub followed by thorough prep and drape sterilely with Betadine scrub and paint.  Once this was done, arm was elevated.  Tourniquet was insufflated to 250 mmHg and the patient underwent a very careful and cautious approach to the extremity with a volar radial incision. Dissection was carried down.  FCR tendon sheath was incised dorsally and palmarly.  Fasciotomy accomplished.  Carpal canal contents retracted ulnarly and following this, the patient underwent incision of the pronator.  Pronator was reflected.  At this time, I then re-created the bony anatomy, placed 5 mL of stay graft in a dorsal V defect and then applied a plate and screw construct.  This was a narrow [employer] DVR plate and screw construct performed without difficulty.  I was able to achieve adequate radial height, inclination, and volar tilt to my satisfaction without difficulty.  The patient tolerated this well.  Once this was complete, we then carefully and cautiously irrigated, closed the pronator and took final copy x-rays.  This was an ORIF of an intra-articular distal radius fracture.  Stress radiography looked excellent.  There were no complicating features.  Once this was complete, the patient then underwent stress radiography about the ulna.  The  patient had a smooth arc of motion about the distal radioulnar joint.  No instability.  Radiocarpal and midcarpal joints were stable as well.  At this time, we then irrigated copiously, deflated the tourniquet, closed the wound over TLS drain.  The patient tolerated this well.  Short-arm splint was applied after Adaptic, Xeroform gauze, 4x4s and Webril were placed.  The patient tolerated this well.  At this time, we then elevated the arm.  Turned our attention toward the left ankle.   The patient had a tremendous amount of pain from an injury days before her fall, which resulted in a distal radius fracture.  I x- rayed her ankle at length.  She had a large Haglund deformity and I query whether this has a small nondisplaced fracture associated with it. We will work this up with radiography/MRI scan on a p.r.n. basis and place them in a 3D boot.  These notes have been discussed.  There is no mortise injury to her ankle.  She will be admitted overnight for IV antibiotics, postop observation and general measures.  Should any problems occur, she will notify me.     Satira Anis. Amedeo Plenty, M.D.     [facility]  D:  05/29/2015  T:  05/29/2015  Job:  [id number]

## 2015-05-30 ENCOUNTER — Observation Stay (HOSPITAL_COMMUNITY): Payer: Medicare Other

## 2015-05-30 DIAGNOSIS — Z23 Encounter for immunization: Secondary | ICD-10-CM | POA: Diagnosis not present

## 2015-05-30 DIAGNOSIS — I10 Essential (primary) hypertension: Secondary | ICD-10-CM

## 2015-05-30 DIAGNOSIS — Z79899 Other long term (current) drug therapy: Secondary | ICD-10-CM | POA: Diagnosis not present

## 2015-05-30 DIAGNOSIS — S5291XA Unspecified fracture of right forearm, initial encounter for closed fracture: Secondary | ICD-10-CM | POA: Diagnosis not present

## 2015-05-30 DIAGNOSIS — S52613A Displaced fracture of unspecified ulna styloid process, initial encounter for closed fracture: Secondary | ICD-10-CM | POA: Diagnosis not present

## 2015-05-30 DIAGNOSIS — Z888 Allergy status to other drugs, medicaments and biological substances status: Secondary | ICD-10-CM | POA: Diagnosis not present

## 2015-05-30 DIAGNOSIS — M810 Age-related osteoporosis without current pathological fracture: Secondary | ICD-10-CM | POA: Diagnosis not present

## 2015-05-30 DIAGNOSIS — E785 Hyperlipidemia, unspecified: Secondary | ICD-10-CM

## 2015-05-30 DIAGNOSIS — Z7901 Long term (current) use of anticoagulants: Secondary | ICD-10-CM | POA: Diagnosis not present

## 2015-05-30 DIAGNOSIS — S52501A Unspecified fracture of the lower end of right radius, initial encounter for closed fracture: Secondary | ICD-10-CM | POA: Diagnosis present

## 2015-05-30 DIAGNOSIS — E1065 Type 1 diabetes mellitus with hyperglycemia: Secondary | ICD-10-CM | POA: Diagnosis present

## 2015-05-30 DIAGNOSIS — Z833 Family history of diabetes mellitus: Secondary | ICD-10-CM | POA: Diagnosis not present

## 2015-05-30 DIAGNOSIS — E1043 Type 1 diabetes mellitus with diabetic autonomic (poly)neuropathy: Secondary | ICD-10-CM | POA: Diagnosis not present

## 2015-05-30 DIAGNOSIS — Z885 Allergy status to narcotic agent status: Secondary | ICD-10-CM | POA: Diagnosis not present

## 2015-05-30 DIAGNOSIS — Z8673 Personal history of transient ischemic attack (TIA), and cerebral infarction without residual deficits: Secondary | ICD-10-CM | POA: Diagnosis not present

## 2015-05-30 DIAGNOSIS — K3184 Gastroparesis: Secondary | ICD-10-CM | POA: Diagnosis present

## 2015-05-30 DIAGNOSIS — Z7982 Long term (current) use of aspirin: Secondary | ICD-10-CM | POA: Diagnosis not present

## 2015-05-30 DIAGNOSIS — W19XXXA Unspecified fall, initial encounter: Secondary | ICD-10-CM | POA: Diagnosis not present

## 2015-05-30 DIAGNOSIS — F329 Major depressive disorder, single episode, unspecified: Secondary | ICD-10-CM

## 2015-05-30 DIAGNOSIS — E10319 Type 1 diabetes mellitus with unspecified diabetic retinopathy without macular edema: Secondary | ICD-10-CM | POA: Diagnosis not present

## 2015-05-30 DIAGNOSIS — E108 Type 1 diabetes mellitus with unspecified complications: Secondary | ICD-10-CM | POA: Diagnosis not present

## 2015-05-30 DIAGNOSIS — Z9641 Presence of insulin pump (external) (internal): Secondary | ICD-10-CM | POA: Diagnosis not present

## 2015-05-30 DIAGNOSIS — Z794 Long term (current) use of insulin: Secondary | ICD-10-CM | POA: Diagnosis not present

## 2015-05-30 DIAGNOSIS — E871 Hypo-osmolality and hyponatremia: Secondary | ICD-10-CM | POA: Diagnosis not present

## 2015-05-30 LAB — GLUCOSE, CAPILLARY
GLUCOSE-CAPILLARY: 441 mg/dL — AB (ref 65–99)
GLUCOSE-CAPILLARY: 464 mg/dL — AB (ref 65–99)
Glucose-Capillary: 347 mg/dL — ABNORMAL HIGH (ref 65–99)
Glucose-Capillary: 264 mg/dL — ABNORMAL HIGH (ref 65–99)
Glucose-Capillary: 222 mg/dL — ABNORMAL HIGH (ref 65–99)
Glucose-Capillary: 262 mg/dL — ABNORMAL HIGH (ref 65–99)
Glucose-Capillary: 118 mg/dL — ABNORMAL HIGH (ref 65–99)

## 2015-05-30 LAB — BASIC METABOLIC PANEL
ANION GAP: 9 (ref 5–15)
BUN: 10 mg/dL (ref 6–20)
CALCIUM: 8.5 mg/dL — AB (ref 8.9–10.3)
CO2: 30 mmol/L (ref 22–32)
Chloride: 89 mmol/L — ABNORMAL LOW (ref 101–111)
Creatinine, Ser: 0.64 mg/dL (ref 0.44–1.00)
Glucose, Bld: 253 mg/dL — ABNORMAL HIGH (ref 65–99)
Potassium: 3.6 mmol/L (ref 3.5–5.1)
Sodium: 128 mmol/L — ABNORMAL LOW (ref 135–145)

## 2015-05-30 MED ORDER — INSULIN ASPART 100 UNIT/ML ~~LOC~~ SOLN
5.0000 [IU] | Freq: Once | SUBCUTANEOUS | Status: AC
  Administered 2015-05-30: 5 [IU] via SUBCUTANEOUS

## 2015-05-30 MED ORDER — INSULIN ASPART 100 UNIT/ML ~~LOC~~ SOLN
0.0000 [IU] | Freq: Three times a day (TID) | SUBCUTANEOUS | Status: DC
  Administered 2015-05-31 (×2): 5 [IU] via SUBCUTANEOUS

## 2015-05-30 MED ORDER — INSULIN ASPART 100 UNIT/ML ~~LOC~~ SOLN: 0.0000 [IU] | Freq: Every day | SUBCUTANEOUS | Status: DC

## 2015-05-30 NOTE — Consult Note (Signed)
Medical Consultation   Erin Daniels  NFA:[id number]  DOB: [birth year]  DOA: 05/29/2015  PCP: Juluis Pitch, MD  Requesting physician: Dr. Roseanne Kaufman, orthopedic surgery  Reason for consultation: Diabetes pump malfunction, hyperglycemia  History of Present Illness: This is a 69 year old female with a history of type 1 diabetes mellitus, hypertension, but presented after falling and sustaining a right wrist fracture. Patient underwent ORIF on 05/29/2015.  It seems patient's hyperglycemia.  Patient does use an insulin pump it seems that this may have malfunctioned.  [facility] called on consultation.   Allergies:   Allergies  Allergen Reactions  . Codeine Nausea Only    Can take Vicodin and Percocet   . Meperidine Nausea Only  . Metoclopramide Hcl Nausea Only  . Oxycodone Nausea Only  . Stadol  [Butorphanol] Nausea Only  . Tussionex Pennkinetic Er [Hydrocod Polst-Cpm Polst Er] Other (See Comments)    Filled patient with sugar   . Carbocaine  [Mepivacaine Hcl] Palpitations      Past Medical History  Diagnosis Date  . Hypertension   . Myofascial muscle pain   . Hypercholesteremia   . Hypothyroidism   . Osteoporosis   . Gastroparesis   . Diabetic retinopathy ([place])   . Stroke (HCC)     x5  . Dizziness   . Slurred speech   . Numbness of arm     left  . Basal cell carcinoma     right upper leg  . Poor vision   . Wears glasses   . Diabetes mellitus without complication (H[facility])     Type I  . Insulin pump in place   . Herniated disc, cervical   . Vertigo   . Hypotensive episode   . At high risk for falls   . Complication of anesthesia     difficult to arouse  . Seizures ([place])   . Diabetic neuropathy [facility])     Past Surgical History  Procedure Laterality Date  . Tonsillectomy    . Hemorroidectomy    . Hernia repair    . Joint replacement    . Appendectomy    . Tubal ligation    . Eye surgery    . Trigger finger release    . Bilateral carpal tunnel release     . Synovectomy    . Colonoscopy    . Shoulder arthroscopy Left   . Shoulder hemi-arthroplasty Left     Social History:  reports that she has never smoked. She has never used smokeless tobacco. She reports that she does not drink alcohol or use illicit drugs.  Family History .Grandfather had diabetes.  Mother had HTN. Father died in his 22s from heart disease.   Review of Systems:  Constitutional: Denies fever, chills, diaphoresis, appetite change and fatigue.  HEENT: Denies photophobia, eye pain, redness, hearing loss, ear pain, congestion, sore throat, rhinorrhea, sneezing, mouth sores, trouble swallowing, neck pain, neck stiffness and tinnitus.   Respiratory: Denies SOB, DOE, cough, chest tightness,  and wheezing.   Cardiovascular: Denies chest pain, palpitations and leg swelling.  Gastrointestinal: Denies nausea, vomiting, abdominal pain, diarrhea, constipation, blood in stool and abdominal distention.  Genitourinary: Denies dysuria, urgency, frequency, hematuria, flank pain and difficulty urinating.  Musculoskeletal: Denies myalgias, back pain, joint swelling, arthralgias and gait problem.  Pain in the left ankle.  Skin: Denies pallor, rash and wound.  Neurological: Denies dizziness, seizures, syncope, weakness, light-headedness, numbness and headaches.  Hematological: Denies adenopathy. Easy bruising, personal or family bleeding history  Psychiatric/Behavioral: Denies suicidal ideation, mood changes, confusion, nervousness, sleep disturbance and agitation  Physical Exam: Blood pressure 120/43, pulse 91, temperature 98.1 F (36.7 C), temperature source Oral, resp. rate 17, height 5\' 1"  (1.549 m), weight 50.803 kg (112 lb), SpO2 92 %.   General: Well developed, well nourished, NAD, appears stated age  HEENT: NCAT, PERRLA, EOMI, Anicteic Sclera, mucous membranes moist. Bruising upper lip and perioral area  Neck: Supple, no JVD, no masses  Cardiovascular: S1 S2 auscultated, no  rubs, murmurs or gallops. Regular rate and rhythm.  Respiratory: Clear to auscultation bilaterally with equal chest rise  Abdomen: Soft, nontender, nondistended, + bowel sounds  Extremities: warm dry without cyanosis clubbing or edema.  Bruising around the left ankle.   Neuro: AAOx3, cranial nerves grossly intact. Strength 5/5 in patient's lower extremities bilaterally and 5/5 LUE  Skin: Without rashes exudates or nodules, RUE in cast  Psych: Normal affect and demeanor with intact judgement and insight  Labs on Admission:  Basic Metabolic Panel:  Recent Labs Lab 05/27/15 1611  NA 135  K 3.9  CL 94*  CO2 33*  GLUCOSE 140*  BUN 18  CREATININE 0.38*  CALCIUM 9.5   Liver Function Tests: No results for input(s): AST, ALT, ALKPHOS, BILITOT, PROT, ALBUMIN in the last 168 hours. No results for input(s): LIPASE, AMYLASE in the last 168 hours. No results for input(s): AMMONIA in the last 168 hours. CBC:  Recent Labs Lab 05/27/15 1611  WBC 6.8  HGB 10.9*  HCT 32.7*  MCV 93.2  PLT 219   Cardiac Enzymes: No results for input(s): CKTOTAL, CKMB, CKMBINDEX, TROPONINI in the last 168 hours. BNP: Invalid input(s): POCBNP CBG:  Recent Labs Lab 05/30/15 0409 05/30/15 0728 05/30/15 1210 05/30/15 1534 05/30/15 1620  GLUCAP 264* 222* 262* 441* 464*    Inpatient Medications:   Scheduled Meds: .  ceFAZolin (ANCEF) IV  1 g Intravenous Q8H  . gabapentin  300 mg Oral TID  . hydrochlorothiazide  12.5 mg Oral Q breakfast  . [START ON 05/31/2015] insulin aspart  0-15 Units Subcutaneous TID WC  . insulin aspart  0-5 Units Subcutaneous QHS  . insulin pump   Subcutaneous 6 times per day  . losartan  100 mg Oral QPC supper  . multivitamin with minerals  1 tablet Oral QPM  . simvastatin  20 mg Oral QPM  . venlafaxine  75 mg Oral BID  . vitamin C  1,000 mg Oral Daily   Continuous Infusions: . lactated ringers 75 mL/hr at 05/30/15 0700     Radiological Exams on Admission: Mr  Ankle Left  Wo Contrast  05/30/2015   CLINICAL DATA:  Left ankle pain and swelling fall.  EXAM: MRI OF THE LEFT ANKLE WITHOUT CONTRAST  TECHNIQUE: Multiplanar, multisequence MR imaging of the ankle was performed. No intravenous contrast was administered.  COMPARISON:  None.  FINDINGS: TENDONS  Peroneal: Normal.  Posteromedial: Normal.  Anterior: Normal.  Achilles: There is severe chronic distal Achilles tendinopathy with tendon hypertrophy and what appears to be extensive dystrophic calcification in the tendon. However, there is no edema in or around the Achilles tendon. No edema in Kager's fat pad.  Plantar Fascia: Normal.  LIGAMENTS  Lateral: Intact. There is edema in the soft tissues around the anterior talofibular ligament the ligament appears completely normal. There is a small effusion in the distal talofibular joint space. There is also focal edema in the subcutaneous soft tissues in the anchor lateral aspect of the distal lower leg  approximately 5 cm above the tip of the fibula consistent with a soft tissue contusion.  Medial: Normal.  CARTILAGE  Ankle Joint: Tiny ankle effusion.  Subtalar Joints/Sinus Tarsi: Normal.  Bones: Normal.  No fractures or bone contusions.  IMPRESSION: 1. Soft tissue contusion at the anterior lateral aspect of the lower leg extending over the lateral malleolus and just anterior to the malleolus around the intact anterior talofibular ligament. 2. No osseous abnormalities. 3. Severe chronic distal Achilles tendinosis.   Electronically Signed   By: Lorriane Shire M.D.   On: 05/30/2015 12:17    Impression/Recommendations  Diabetes mellitus, type I -Patient uses an insulin pump however at this time it is malfunction -Will place patient on insulin sliding scale with CBG monitoring -Blood sugars have been in the 400s -Will order BMP  -Last HbA1c 5.9 on 03/11/2015 -Will consult diabetes coordinator to evaluate insulin pump  Right wrist fracture -Per primary team,  orthopedics -Status post ORIF  Hypertension -Continue HCTZ, losartan  Hyperlipidemia -Continue statin  Diabetic neuropathy -Continue gabapentin  Depression -Continue Effexor XR and Xanax PRN  Left ankle swelling -MRI: Soft tissue contusion at the anterior lateral aspect of the lower leg extending over the lateral malleolus and just anterior to the malleolus around the intact anterior talofibular ligament -PT consulted  Thank you for the consultation and allowing [place] to participate in the care and management of your patient.   Time Spent: 45 minutes  Daliyah Sramek D.O. [facility] 05/30/2015, 5:35 PM

## 2015-05-30 NOTE — Progress Notes (Signed)
Pt currently on ssi dosage. She has placed her insulin pump on suspend mode. Pt understands that we will be delivering her insulin after her blood sugar checks. Pt alert and oriented. No symptoms of hyperglycemia.

## 2015-05-30 NOTE — Progress Notes (Signed)
PT Cancellation Note  Patient Details Name: Erin Daniels MRN: [id number] DOB: 1946/03/30   Cancelled Treatment:    Reason Eval/Treat Not Completed: Other (comment) (boot not here, husband bringing this evening)   Jesse Nosbisch, Adolm Joseph 05/30/2015, 1:53 PM

## 2015-05-30 NOTE — Progress Notes (Signed)
Patient was using her own insulin pump for CBG coverage. The pump needed more insulin this afternoon and I ordered a vial of novolog from the pharmacy. The patient has very limited use of her right hand, so I assisted her with changing out her insulin pump set. At dinner time, the patient's CBG was 441 which is high and out of the normal range for her. The patient administered 6 units via her pump and I paged the attending, Dr. Vanetta Shawl office. I rechecked the patient's CBG a while later and it was 464. At this point, it's clear that the pump is not working so I administered the 6 units of insulin that was not delivered. I spoke with [facility], [facility] about the patient's insulin pump possibly malfunctioning. She put in a consult to the [facility] hospital[facility]. Dr. Ree Kida put in sliding scale orders for novolog, along with some lab work orders. Once the patient's CBG was rechecked, it had come down to 305. Dr. Ree Kida ordered a one time dose of novolog 5 units to cover. There is an order for a diabetic coordinator consult related to the insulin pump malfuction. The patient is resting comfortably in her bed. No s/s of hyperglycemia noted. Will continue to monitor.

## 2015-05-30 NOTE — Progress Notes (Signed)
Patient ID: Erin Daniels, female   DOB: 05-11-1946, 69 y.o.   MRN: [id number] Patient is alert and oriented. Patient has her drain removed bedside. She has no comp K features.  He complains of left ankle pain. 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  She's neurovascularly intact about the hand I discussed her issues with therapy  HEENT is within normal limits chest is clear abdomen's nontender lower extremity examination still reveals pain and left ankle. No evidence of infection.  We'll plan for an MRI of left ankle to rule out racks or through the bone spur about the Achilles insertion (Haglund's deformity).  The patient is can obtain her 3-D Bledsoe walking boot from home and I have written orders for therapy.  At present time we'll continue elevation range of motion for the right hand  Hadlie Gipson MD

## 2015-05-31 ENCOUNTER — Encounter (HOSPITAL_COMMUNITY): Payer: Self-pay | Admitting: Orthopedic Surgery

## 2015-05-31 DIAGNOSIS — E108 Type 1 diabetes mellitus with unspecified complications: Secondary | ICD-10-CM

## 2015-05-31 DIAGNOSIS — E103593 Type 1 diabetes mellitus with proliferative diabetic retinopathy without macular edema, bilateral: Secondary | ICD-10-CM | POA: Insufficient documentation

## 2015-05-31 DIAGNOSIS — E785 Hyperlipidemia, unspecified: Secondary | ICD-10-CM | POA: Insufficient documentation

## 2015-05-31 DIAGNOSIS — I10 Essential (primary) hypertension: Secondary | ICD-10-CM | POA: Insufficient documentation

## 2015-05-31 DIAGNOSIS — E871 Hypo-osmolality and hyponatremia: Secondary | ICD-10-CM

## 2015-05-31 DIAGNOSIS — F329 Major depressive disorder, single episode, unspecified: Secondary | ICD-10-CM | POA: Insufficient documentation

## 2015-05-31 DIAGNOSIS — F32A Depression, unspecified: Secondary | ICD-10-CM | POA: Insufficient documentation

## 2015-05-31 LAB — GLUCOSE, CAPILLARY
GLUCOSE-CAPILLARY: 79 mg/dL (ref 65–99)
GLUCOSE-CAPILLARY: 194 mg/dL — AB (ref 65–99)
Glucose-Capillary: 347 mg/dL — ABNORMAL HIGH (ref 65–99)
Glucose-Capillary: 245 mg/dL — ABNORMAL HIGH (ref 65–99)

## 2015-05-31 LAB — BASIC METABOLIC PANEL
ANION GAP: 10 (ref 5–15)
BUN: 7 mg/dL (ref 6–20)
CO2: 30 mmol/L (ref 22–32)
Calcium: 9.3 mg/dL (ref 8.9–10.3)
Chloride: 94 mmol/L — ABNORMAL LOW (ref 101–111)
Creatinine, Ser: 0.52 mg/dL (ref 0.44–1.00)
GFR calc non Af Amer: >60 mL/min (ref >60)
Glucose, Bld: 175 mg/dL — ABNORMAL HIGH (ref 65–99)
POTASSIUM: 3.6 mmol/L (ref 3.5–5.1)
SODIUM: 134 mmol/L — AB (ref 135–145)

## 2015-05-31 MED ORDER — INSULIN ASPART 100 UNIT/ML ~~LOC~~ SOLN
5.0000 [IU] | Freq: Once | SUBCUTANEOUS | Status: AC
  Administered 2015-05-31: 5 [IU] via SUBCUTANEOUS

## 2015-05-31 NOTE — Progress Notes (Addendum)
HS cbg was 118. Pt requested cbg check at 0300. States she usually checks her blood sugars at home eight times per day. CBG was 347. On call hospitalist notified. 5 units of Novolog ordered and given. Pt states she has no hyperglycemic symptoms.

## 2015-05-31 NOTE — Progress Notes (Signed)
Occupational Therapy Treatment Patient Details Name: Erin Daniels MRN: [id number] DOB: 1946/05/18 Today's Date: 05/31/2015    History of present illness 69 y.o. s/p  ORIF right wrist due to a fall. History of CVAs.   OT comments  Pt making progress toward OT goals. Pt able to perform UB/LB dressing with min assist today. Pt able to recall 1/3 edema management strategies at beginning of session, educated pt on techniques, able to recall 2/3 at end of session. Educated pt and husband on compensatory strategies for ADLs. Educated pts husband on need for close supervision during ADLs and functional mobility, husband verbalized understanding. D/c plan remains appropriate at this time. Continue to follow pt acutely.    Follow Up Recommendations  No OT follow up;Supervision - Intermittent    Equipment Recommendations  None recommended by OT    Recommendations for Other Services      Precautions / Restrictions Precautions Precaution Comments: no pushing, pulling, lifting with RUE (can use to hold light items). Reviewed precautions with pt and husband Restrictions Weight Bearing Restrictions: Yes RUE Weight Bearing: Weight bear through elbow only       Mobility Bed Mobility Overal bed mobility: Needs Assistance Bed Mobility: Sit to Supine       Sit to supine: Supervision      Transfers Overall transfer level: Needs assistance Equipment used: None Transfers: Sit to/from Stand Sit to Stand: Min guard         General transfer comment: Min assist sit <> stand from EOB x 1    Balance Overall balance assessment: Needs assistance         Standing balance support: No upper extremity supported;During functional activity Standing balance-Leahy Scale: Poor Standing balance comment: Pt able to pull up pants without UE stupport                   ADL Overall ADL's : Needs assistance/impaired Eating/Feeding: Set up;Sitting               Upper Body Dressing :  Minimal assistance;Sitting Upper Body Dressing Details (indicate cue type and reason): Min A to guide R UE into sleeve. Verbal cues for technique Lower Body Dressing: Minimal assistance;Sit to/from stand Lower Body Dressing Details (indicate cue type and reason): Min A to start L sock over foot, assist with buttoning pants             Functional mobility during ADLs: Min guard General ADL Comments: Husband present during session. Pt unable to recall precautions. Pt able to recall 1/3 edema management techniques, educated on edema management, pt able to recall 2/3 at end of session. Educated pts husband on need for close supervision during ADLs and functional mobility, husband verbalizes understanding stating that he provided hand held assist/supervision PTA      Vision                     Perception     Praxis      Cognition   Behavior During Therapy: WFL for tasks assessed/performed Overall Cognitive Status: Within Functional Limits for tasks assessed       Memory: Decreased recall of precautions               Extremity/Trunk Assessment               Exercises     Shoulder Instructions       General Comments      Pertinent Vitals/ Pain  Pain Assessment: Faces Faces Pain Scale: Hurts even more Pain Location: RUE, L foot Pain Descriptors / Indicators: Sore;Other (Comment) (itching- L foot ) Pain Intervention(s): Limited activity within patient's tolerance;Monitored during session;Repositioned  Home Living                                          Prior Functioning/Environment              Frequency Min 2X/week     Progress Toward Goals  OT Goals(current goals can now be found in the care plan section)  Progress towards OT goals: Progressing toward goals  Acute Rehab OT Goals Patient Stated Goal: not stated  Plan Discharge plan remains appropriate    Co-evaluation                 End of Session  Equipment Utilized During Treatment: Other (comment) (L foot boot)   Activity Tolerance Patient tolerated treatment well   Patient Left in bed;with call bell/phone within reach;with nursing/sitter in room;with family/visitor present   Nurse Communication Other (comment) (Pts IV coming out )        Time: 1010-1034 OT Time Calculation (min): 24 min  Charges: OT General Charges $OT Visit: 1 Procedure OT Treatments $Self Care/Home Management : 23-37 mins  Binnie Kand M.S., OTR/L Pager: [phone]  05/31/2015, 10:45 AM

## 2015-05-31 NOTE — Discharge Summary (Signed)
Physician Discharge Summary  Patient ID: Erin Daniels MRN: [id number] DOB/AGE: 1945/10/26 69 y.o.  Admit date: 05/29/2015 Discharge date: 05/31/2015  Admission Diagnoses: Comminuted interarticular right distal radius fracture Patient Active Problem List   Diagnosis Date Noted  . Right radial fracture 05/29/2015     Hypertension   . Myofascial muscle pain   . Hypercholesteremia   . Hypothyroidism   . Osteoporosis   . Gastroparesis   . Diabetic retinopathy ([place])   . Stroke ([facility])     x5  . Dizziness   . Slurred speech   . Numbness of arm     left  . Basal cell carcinoma     right upper leg  . Poor vision   . Wears glasses   . Diabetes mellitus without complication ([facility])     Type I  . Insulin pump in place   . Herniated disc, cervical   . Vertigo   . Hypotensive episode   . At high risk for falls   . Complication of anesthesia     difficult to arouse  . Seizures ([facility])   . Diabetic neuropathy [facility])         Discharge Diagnoses: See above Active Problems:   Right radial fracture   Discharged Condition: Improved  Hospital Course: The patient is a very pleasant 69 year old female who is admitted to undergo operative intervention to her right upper extremity. She unfortunately fell sustaining a right distal radius fracture. She was initially seen and evaluated [facility] emergency room she underwent preliminary reduction measures with improved alignment, however, given the degree of comminution discussed with her at concerns for progressive collapse and discussed with her surgical intervention. The patient desired to proceed ahead with operative intervention. She was admitted on October 8 she underwent an open reduction internal fixation of her right distal radius fracture without complicating features. Please see operative report for full details. Right to her surgical intervention of  course medical clearance was obtained given her medical history of diabetes, hypertension and the need for chronic anticoagulation. The patient was noted to be stable for operative intervention. She tolerated the procedure well there was no difficulties.  Postoperatively the patient was admitted to the orthopedic unit with close observation, IV antibiotics, pain management. Medicine consult was implemented given her multiple medical comorbidities. The hospitalist appropriately managed her diabetes, hypertension and other medical issues while inpatient. The patient very well in regards to her upper extremity, her pain was controlled. She was working on digital range of motion and elevation without difficulties. The primary source of her pain was in regards to her left foot and ankle. She has been seen and evaluated and treated over the past week and a half of our office for an ankle sprain. She does have a 3-D boot at home. She complained of continued pain and difficulties thus CT scan was ordered showing a Haglund deformity without acute fracture process present about the foot. We discussed with her shoes and in addition the fact that she does have a grade 2-3 ankle sprain. She has not been wearing her boot on a continual basis. Postoperative day #2 the patient was awake, alert and oriented, finishing breakfast. Her pain was very controlled in regards to the right hand she still complained of pain about the left ankle. We discussed with her all findings to include the recent CT scan of her ankle. We have discussed with her discharge issues. She will need to continue to keep the right upper extremity  elevated work on digital range of motion and edema control. She will keep her splint clean dry and intact. We are going to have therapy see her today as her husband will be bringing her the Cam Walker boot from home. We need to ensure that therapy sees her for ambulatory purposes and make sure that her gait is  appropriate. She can weight-bear through her forearm of the operative extremity but no weightbearing with the hand or wrist thus if a platform walker knees to be implemented this would be fine. Otherwise the patient could use a cane or other ambulatory assisted device should she need this pending her evaluation with therapy prior to discharge. I have discussed with her follow-up with her endocrinologist this week, and intact, I have asked her to contact the office today while she is still in the hospital to discuss with them her postop insulin/diabetic management and follow-up. She may resume her anticoagulation. We have discussed all postoperative issues with her and will need to see her in our office in approximately 12 days for a wound check suture removal and cast application. In regards to the left ankle she needs to wear her Cam Walker boot when she is ambulatory. All questions were encouraged and answered  Consults: Hospitalist  Significant Diagnostic Studies: Mr Ankle Left  Wo Contrast  05/30/2015   CLINICAL DATA:  Left ankle pain and swelling fall.  EXAM: MRI OF THE LEFT ANKLE WITHOUT CONTRAST  TECHNIQUE: Multiplanar, multisequence MR imaging of the ankle was performed. No intravenous contrast was administered.  COMPARISON:  None.  FINDINGS: TENDONS  Peroneal: Normal.  Posteromedial: Normal.  Anterior: Normal.  Achilles: There is severe chronic distal Achilles tendinopathy with tendon hypertrophy and what appears to be extensive dystrophic calcification in the tendon. However, there is no edema in or around the Achilles tendon. No edema in Kager's fat pad.  Plantar Fascia: Normal.  LIGAMENTS  Lateral: Intact. There is edema in the soft tissues around the anterior talofibular ligament the ligament appears completely normal. There is a small effusion in the distal talofibular joint space. There is also focal edema in the subcutaneous soft tissues in the anchor lateral aspect of the distal lower leg  approximately 5 cm above the tip of the fibula consistent with a soft tissue contusion.  Medial: Normal.  CARTILAGE  Ankle Joint: Tiny ankle effusion.  Subtalar Joints/Sinus Tarsi: Normal.  Bones: Normal.  No fractures or bone contusions.  IMPRESSION: 1. Soft tissue contusion at the anterior lateral aspect of the lower leg extending over the lateral malleolus and just anterior to the malleolus around the intact anterior talofibular ligament. 2. No osseous abnormalities. 3. Severe chronic distal Achilles tendinosis.   Electronically Signed   By: Lorriane Shire M.D.   On: 05/30/2015 12:17    Treatments: 1. Open reduction and internal fixation, greater than 3-part intra-  articular comminuted distal radius fracture with 5 mL of stay  graft, bone graft, and a plate and screw construct from [employer] (DVR  plate) utilized. 2. AP, lateral, and oblique x-rays performed, examined, and  interpreted by myself, right wrist. 3. Closed treatment ulnar styloid fracture right wrist. 4. Stress radiography, left ankle with noted large calcific Achilles  insertional pump bump (Haglund deformity) left ankle.   Discharge Exam: Blood pressure 148/56, pulse 88, temperature 98.4 F (36.9 C), temperature source Oral, resp. rate 17, height 5\' 1"  (1.549 m), weight 50.803 kg (112 lb), SpO2 99 %.  The patient is alert and oriented in  no acute distress. The patient complains of pain in the affected upper extremity.  The patient is noted to have a normal HEENT exam. Lung fields show equal chest expansion and no shortness of breath. Abdomen exam is nontender without distention. Pelvis is stable and the neck and back are stable and nontender. Examination of the right upper extremity shows that her dressings are clean dry and intact. She has mild edema about the digits, sensation is intact, refill is intact. She exhibits active range of motion with flexion and extension.  Evaluation left lower extremity shows  that she has mild edema about the lateral joint line in addition she is tender over the distal fibula as well as the anterior and posterior talofibular ligaments has associated ecchymosis. She has findings consistent with a Haglund's deformity with pain with palpation.   Disposition: 01-Home or Self Care  Discharge Instructions    Call MD / Call 911    Complete by:  As directed   If you experience chest pain or shortness of breath, CALL 911 and be transported to the hospital emergency room.  If you develope a fever above 101 F, pus (white drainage) or increased drainage or redness at the wound, or calf pain, call your surgeon's office.     Constipation Prevention    Complete by:  As directed   Drink plenty of fluids.  Prune juice may be helpful.  You may use a stool softener, such as Colace (over the counter) 100 mg twice a day.  Use MiraLax (over the counter) for constipation as needed.     Diet - low sodium heart healthy    Complete by:  As directed      Discharge instructions    Complete by:  As directed   .Keep bandage clean and dry.  Call for any problems.  No smoking.  Criteria for driving a car: you should be off your pain medicine for 7-8 hours, able to drive one handed(confident), thinking clearly and feeling able in your judgement to drive. Continue elevation as it will decrease swelling.  If instructed by MD move your fingers within the confines of the bandage/splint.  Use ice if instructed by your MD. Call immediately for any sudden loss of feeling in your hand/arm or change in functional abilities of the extremity. .We recommend that you to take vitamin C 1000 mg a day to promote healing. We also recommend that if you require  pain medicine that you take a stool softener to prevent constipation as most pain medicines will have constipation side effects. We recommend either Peri-Colace or Senokot and recommend that you also consider adding MiraLAX to prevent the constipation affects from  pain medicine if you are required to use them. These medicines are over the counter and maybe purchased at a local pharmacy. A cup of yogurt and a probiotic can also be helpful during the recovery process as the medicines can disrupt your intestinal environment.     Increase activity slowly as tolerated    Complete by:  As directed             Medication List    TAKE these medications        aspirin 325 MG EC tablet  Take 325 mg by mouth daily with breakfast.     butalbital-acetaminophen-caffeine 50-325-40 MG tablet  Commonly known as:  FIORICET, ESGIC  Take 0.5-1 tablets by mouth 3 (three) times daily as needed for headache.     CALCIUM 600 + D  PO  Take 1 tablet by mouth every evening.     carboxymethylcellulose 0.5 % Soln  Commonly known as:  REFRESH PLUS  Take 1-2 drops by mouth daily after breakfast.     doxycycline 40 MG capsule  Commonly known as:  ORACEA  Take 40 mg by mouth every evening.     folic acid 1 MG tablet  Commonly known as:  FOLVITE  Take 1 mg by mouth daily.     gabapentin 300 MG capsule  Commonly known as:  NEURONTIN  Take 300 mg by mouth 3 (three) times daily. Takes in the morning, at dinner, and at bedtime     hydrochlorothiazide 12.5 MG capsule  Commonly known as:  MICROZIDE  Take 12.5 mg by mouth daily with breakfast.     insulin pump Soln  Inject 0.4-4 each into the skin continuous. Uses Novolog     IRON (FERROUS GLUCONATE) PO  Take 1 tablet by mouth every evening.     losartan 100 MG tablet  Commonly known as:  COZAAR  Take 100 mg by mouth every evening.     multivitamin with minerals Tabs tablet  Take 1 tablet by mouth every evening.     oxyCODONE 5 MG immediate release tablet  Commonly known as:  ROXICODONE  Take 1 tablet (5 mg total) by mouth every 6 (six) hours as needed for severe pain.     polyethylene glycol packet  Commonly known as:  MIRALAX / GLYCOLAX  Take 17 g by mouth every other day.     promethazine 25 MG  suppository  Commonly known as:  PHENERGAN  Place 25 mg rectally every 6 (six) hours as needed for nausea or vomiting.     simvastatin 20 MG tablet  Commonly known as:  ZOCOR  Take 20 mg by mouth every evening.     tiZANidine 2 MG tablet  Commonly known as:  ZANAFLEX  Take 2 mg by mouth at bedtime.     traMADol 50 MG tablet  Commonly known as:  ULTRAM  Take 50 mg by mouth every 6 (six) hours as needed.     venlafaxine 75 MG tablet  Commonly known as:  EFFEXOR  Take 75 mg by mouth 2 (two) times daily.     VITAMIN C PO  Take 1 tablet by mouth daily with breakfast.     VITAMIN E PO  Take 1 tablet by mouth every evening.     warfarin 5 MG tablet  Commonly known as:  COUMADIN  Take 10 mg by mouth daily at 6 PM.           Follow-up Information    Follow up with Paulene Floor, MD In 12 days.   Specialty:  Orthopedic Surgery   Why:  For wound re-check, For suture removal our office will contact you with appointment time and date   Contact information:   [address] [place] [address] [phone]       Signed: Ivan Croft 05/31/2015, 8:22 AM

## 2015-05-31 NOTE — Progress Notes (Signed)
Inpatient Diabetes Program Recommendations  AACE/ADA: New Consensus Statement on Inpatient Glycemic Control (2015)  Target Ranges:  Prepandial:   less than 140 mg/dL      Peak postprandial:   less than 180 mg/dL (1-2 hours)      Critically ill patients:  140 - 180 mg/dL   Review of Glycemic Control:  Results for Erin Daniels, Erin Daniels (MRN [id number]) as of 05/31/2015 10:10  Ref. Range 05/30/2015 15:34 05/30/2015 16:20 05/30/2015 21:16 05/31/2015 03:00 05/31/2015 06:39  Glucose-Capillary Latest Ref Range: 65-99 mg/dL 441 (H) 464 (H) 118 (H) 347 (H) 245 (H)    Diabetes history: Type 1 diabetes Outpatient Diabetes medications: Insulin pump-Animas Current orders for Inpatient glycemic control:  Novolog moderate tid with meals and HS-  Insulin pump was in suspend  Call received from RN regarding patient and insulin pump.  Evindently after site was changed yesterday evening, RN noted that blood sugars were going up.  MD was called and Novolog correction was started.  Patient was covered with Novolog correction scale throughout the night.  Assessed patient's insulin pump site this morning.  Discontinued site and noted that cannula was slightly bent which likely was preventing insulin from being infused.  Patient states "that was the problem".  She states that as soon as she gets home, she will restart the insulin pump with the help of her husband.  She is able to manipulate the numbers/buttons with her left hand but admits that she will need help from her husband in restarting the site.   Discussed at length with MD, Dr. Waymon Budge.  She has instructed patient to restart insulin pump when she gets home and if problems arise, she has been instructed to call her endocrinologist at [facility] for further instructions.  Inpatient Diabetes Program Recommendations:    RN rechecked CBG at 10:08.  Instructed RN to give patient a snack to patient since blood sugar <80 mg/dL. CBG (last 3)   Recent Labs  05/31/15 0300  05/31/15 0639 05/31/15 1008  GLUCAP 347* 245* 79    Thanks, Adah Perl, RN, BC-ADM Inpatient Diabetes Coordinator Pager [phone] (8a-5p)

## 2015-05-31 NOTE — Evaluation (Signed)
Physical Therapy Evaluation Patient Details Name: Erin Daniels MRN: [id number] DOB: 08/07/1946 Today's Date: 05/31/2015   History of Present Illness  69 y.o. s/p  ORIF right wrist due to a fall. History of CVAs.  Clinical Impression  Patient is s/p above surgery resulting in functional limitations due to the deficits listed below (see PT Problem List).  Patient will benefit from skilled PT to increase their independence and safety with mobility to allow discharge to home with spouse support. Patient's spouse present for session and demonstrating guarding technique with ambulation in the room. Reinforced recommendation for patient to be up with assistance only which the patient and spouse agree with. At the end of the session the patient confirmed that this is close to her baseline for mobility and both the patient and spouse report feeling confident with her current mobility.       Follow Up Recommendations Home health PT;Supervision for mobility/OOB    Equipment Recommendations  None recommended by PT    Recommendations for Other Services       Precautions / Restrictions Precautions Precautions: Fall Precaution Comments: no pushing, pulling, lifting with RUE (can use to hold light items). Reviewed precautions with pt and husband Required Braces or Orthoses: Other Brace/Splint (Bledsoe boot) Other Brace/Splint: boot on LLE with ambulation Restrictions Weight Bearing Restrictions: Yes RUE Weight Bearing: Weight bear through elbow only LLE Weight Bearing: Weight bearing as tolerated      Mobility  Bed Mobility Overal bed mobility: Needs Assistance Bed Mobility: Supine to Sit     Supine to sit: Supervision Sit to supine: Supervision      Transfers Overall transfer level: Needs assistance Equipment used: None Transfers: Sit to/from Stand Sit to Stand: Min guard         General transfer comment: transfers from bed and chair.   Ambulation/Gait Ambulation/Gait  assistance: Min guard;Supervision Ambulation Distance (Feet): 200 Feet (100X1, 50 X2) Assistive device: None;1 person hand held assist Gait Pattern/deviations: Step-through pattern Gait velocity: mildly decreased   General Gait Details: Initial ambulation performed with hand held assistance and min guard. As patient ambulated more, able to decrease to close supervision. Attempting single point cane and large based quad cane but noted increased instability with use of each device. Patient and spouse confirm that prior to fall she was ambulating with hand held assistance of her spouse. Guarding technique was reviewed and demonstrated by the patient and her husband in the room. Patient and spouse reports that they understand the recommendation of hand held assistace at home.   Stairs Stairs: Yes Stairs assistance: Min guard Stair Management: One rail Left;Step to pattern;Forwards Number of Stairs: 5 General stair comments: patient and spouse confirm feeling confident with current mobilty with stairs.   Wheelchair Mobility    Modified Rankin (Stroke Patients Only)       Balance Overall balance assessment: Needs assistance Sitting-balance support: No upper extremity supported Sitting balance-Leahy Scale: Good     Standing balance support: No upper extremity supported Standing balance-Leahy Scale: Fair Standing balance comment: no loss of balance noted                             Pertinent Vitals/Pain Pain Assessment: Faces Faces Pain Scale: Hurts little more Pain Location: Rt arm, Lt ankle Pain Descriptors / Indicators: Sore Pain Intervention(s): Monitored during session;Limited activity within patient's tolerance    Home Living Family/patient expects to be discharged to:: Private residence Living Arrangements:  Spouse/significant other Available Help at Discharge: Family Type of Home: House Home Access: Stairs to enter Entrance Stairs-Rails: Left Entrance  Stairs-Number of Steps: 3 Home Layout: One level Home Equipment: Walker - 2 wheels;Cane - single point Additional Comments: reports using hand held assistance with ambulation around the house, support provided by her spouse    Prior Function Level of Independence: Needs assistance   Gait / Transfers Assistance Needed: hand held assistance with ambulation           Hand Dominance        Extremity/Trunk Assessment   Upper Extremity Assessment: Defer to OT evaluation           Lower Extremity Assessment: Generalized weakness         Communication   Communication: No difficulties  Cognition Arousal/Alertness: Awake/alert Behavior During Therapy: WFL for tasks assessed/performed Overall Cognitive Status: Within Functional Limits for tasks assessed       Memory: Decreased recall of precautions              General Comments      Exercises        Assessment/Plan    PT Assessment Patient needs continued PT services  PT Diagnosis Difficulty walking   PT Problem List Decreased strength;Decreased range of motion;Decreased activity tolerance;Decreased balance;Decreased mobility  PT Treatment Interventions DME instruction;Gait training;Functional mobility training;Stair training;Therapeutic activities;Therapeutic exercise;Balance training;Patient/family education   PT Goals (Current goals can be found in the Care Plan section) Acute Rehab PT Goals Patient Stated Goal: go home today PT Goal Formulation: With patient Time For Goal Achievement: 06/14/15 Potential to Achieve Goals: Good    Frequency Min 3X/week   Barriers to discharge        Co-evaluation               End of Session Equipment Utilized During Treatment: Gait belt;Other (comment) (bledsoe boot) Activity Tolerance: Patient tolerated treatment well Patient left: in bed;with call bell/phone within reach;with family/visitor present;Other (comment) (sitting edge of bed with OT) Nurse  Communication: Mobility status;Precautions         Time: 6195-0932 PT Time Calculation (min) (ACUTE ONLY): 26 min   Charges:   PT Evaluation $Initial PT Evaluation Tier I: 1 Procedure PT Treatments $Gait Training: 8-22 mins   PT G Codes:        Cassell Clement, PT, CSCS Pager [phone] Office [phone]  05/31/2015, 12:29 PM

## 2015-05-31 NOTE — Progress Notes (Signed)
[facility]                                                                              Patient Demographics  Erin Daniels, is a 69 y.o. female, DOB - June 18, 1946, CWC:[id number]  Admit date - 05/29/2015   Admitting Physician Roseanne Kaufman, MD  Outpatient Primary MD for the patient is DAVID Lovie Macadamia, MD  LOS - 1   No chief complaint on file.     Consulted for Diabetes managmeent  Assessment & Plan   Hyperglycemia in the setting of Diabetes mellitus, type I -Patient uses an insulin pump however it is malfunctioning -Placed on ISS and CBG moniroting -Blood sugars have been in the 400s- BMP was obtained last night, and patient was not in DKA -Last HbA1c 5.9 on 03/11/2015 -Diabetes coordinator consulted, and there was kink in the catheter.  Patient will likely be able to change her site at home.  She was instructed to continue using her insulin pump.  If her sugars are continue to be high, patient is to switch to ISS and follow up with her endocrinologist.  Patient understands the plan.  She also has insulin and needles/syringes at home.  -No further intervention needed at this time.  Hyponatremia due to hyperglycemia -Sodium improving from 128-134 -Recommend repeat BMP in one week  Right wrist fracture -Per primary team, orthopedics -Status post ORIF  Hypertension -Continue HCTZ, losartan  Hyperlipidemia -Continue statin  Diabetic neuropathy -Continue gabapentin  Depression -Continue Effexor XR and Xanax PRN  Left ankle swelling -MRI: Soft tissue contusion at the anterior lateral aspect of the lower leg extending over the lateral malleolus and just anterior to the malleolus around the intact anterior talofibular ligament -PT consulted  Code Status: Full  Family Communication: None at bedside  Disposition Plan: Admitted.  Likely discharge home today.  Time Spent in minutes   30 minutes  Procedures  ORIF right wrist  Consults   TRH  DVT  Prophylaxis  SCDs  Lab Results  Component Value Date   PLT 219 05/27/2015    Medications  Scheduled Meds: .  ceFAZolin (ANCEF) IV  1 g Intravenous Q8H  . gabapentin  300 mg Oral TID  . hydrochlorothiazide  12.5 mg Oral Q breakfast  . insulin aspart  0-15 Units Subcutaneous TID WC  . insulin aspart  0-5 Units Subcutaneous QHS  . insulin pump   Subcutaneous 6 times per day  . losartan  100 mg Oral QPC supper  . multivitamin with minerals  1 tablet Oral QPM  . simvastatin  20 mg Oral QPM  . venlafaxine  75 mg Oral BID  . vitamin C  1,000 mg Oral Daily   Continuous Infusions: . lactated ringers 75 mL/hr at 05/30/15 1820   PRN Meds:.ALPRAZolam, famotidine, HYDROcodone-acetaminophen, methocarbamol **OR** methocarbamol (ROBAXIN)  IV, morphine injection, ondansetron **OR** ondansetron (ZOFRAN) IV, senna-docusate, traMADol  Antibiotics    Anti-infectives    Start     Dose/Rate Route Frequency Ordered Stop   05/29/15 1800  ceFAZolin (ANCEF) IVPB 1 g/50 mL premix     1 g 100 mL/hr over 30 Minutes Intravenous Every 8 hours 05/29/15 1310     05/29/15 0730  ceFAZolin (ANCEF) IVPB 2 g/50 mL premix     2 g 100 mL/hr over 30 Minutes Intravenous On call to O.R. 05/29/15 0715 05/29/15 1038        Subjective:   Erin Daniels seen and examined today.  Patient complains of pain in her ankle and wrist.  She denies chest pain, shortness of breath, abdominal pain, N/V/D.   Objective:   Filed Vitals:   05/30/15 0409 05/30/15 1322 05/30/15 2032 05/31/15 0514  BP: 131/62 120/43 141/70 148/56  Pulse: 94 91 94 88  Temp: 98.4 F (36.9 C) 98.1 F (36.7 C) 98.4 F (36.9 C)   TempSrc: Oral Oral Oral Oral  Resp: 16 17 17 17   Height:      Weight:      SpO2: 97% 92% 98% 99%    Wt Readings from Last 3 Encounters:  05/29/15 50.803 kg (112 lb)  05/27/15 51.256 kg (113 lb)     Intake/Output Summary (Last 24 hours) at 05/31/15 1001 Last data filed at 05/31/15 0500  Gross per 24 hour    Intake   1390 ml  Output      0 ml  Net   1390 ml    Exam  General: Well developed, well nourished, NAD, appears stated age  47: NCAT, mucous membranes moist. Bruising upper lip and perioral area  Cardiovascular: S1 S2 auscultated, no rubs, murmurs or gallops. Regular rate and rhythm.  Respiratory: Clear to auscultation bilaterally   Abdomen: Soft, nontender, nondistended, + bowel sounds  Extremities: warm dry without cyanosis clubbing or edema. Bruising around the left ankle.  Neuro: AAOx3, nonfocal  Skin: Without rashes exudates or nodules, RUE in cast  Psych: Normal affect and demeanor with intact judgement and insight  Data Review   Micro Results No results found for this or any previous visit (from the past 240 hour(s)).  Radiology Reports Dg Wrist 2 Views Right  05/25/2015   CLINICAL DATA:  Distal right radius fracture.  Postreduction.  EXAM: RIGHT WRIST - 2 VIEW  6:15 p.m.  COMPARISON:  Radiographs dated 05/25/2015  FINDINGS: There has been further a manipulation of the fracture of the distal radius with marked improvement in alignment and position. The major fracture fragments are anatomic in position. The fracture does involve the articular surface.  IMPRESSION: Elimination of angulation and displacement of the major fragments of the distal radius.   Electronically Signed   By: Lorriane Shire M.D.   On: 05/25/2015 18:34   Dg Wrist Complete Right  05/25/2015   CLINICAL DATA:  Post reduction right wrist  EXAM: RIGHT WRIST - COMPLETE 3+ VIEW  COMPARISON:  May 25, 2015  FINDINGS: There is comminuted displaced intra-articular impacted fracture of distal radius in cast. The fracture is minimally less dorsal angulated relative to the precast films. The previously question ulna styloid fracture and triquetrum fracture are not as well seen through cast.  IMPRESSION: Comminuted displaced intra-articular impacted fracture of distal radius in cast. The fracture is minimally  less dorsal angulated relative to the precast films.   Electronically Signed   By: Abelardo Diesel M.D.   On: 05/25/2015 15:11   Dg Wrist Complete Right  05/25/2015   CLINICAL DATA:  Pt missed step [place] and tried to catch fall with rt hand, pain, deformity distal rt forearm  EXAM: RIGHT WRIST - COMPLETE 3+ VIEW  COMPARISON:  None.  FINDINGS: There is a comminuted fracture of the distal radius, associated dorsal  tilt but minimal displacement. There is a fracture of the ulnar styloid. There is ulnar plus variance. On the lateral view there is a small bone fragment posterior to the proximal carpus, raising the question of a triquetrum fracture versus fragment of the ulna. There is diffuse soft tissue swelling and general deformity of the wrist. Vascular calcifications are noted. Bone mineral density is decreased.  IMPRESSION: 1. Comminuted fracture of the distal radius. 2. Ulnar styloid fracture. 3. Possible triquetrum fracture.   Electronically Signed   By: Nolon Nations M.D.   On: 05/25/2015 12:02   Ct Head Wo Contrast  05/25/2015   CLINICAL DATA:  Pain following fall  EXAM: CT HEAD WITHOUT CONTRAST  CT MAXILLOFACIAL WITHOUT CONTRAST  CT CERVICAL SPINE WITHOUT CONTRAST  TECHNIQUE: Multidetector CT imaging of the head, cervical spine, and maxillofacial structures were performed using the standard protocol without intravenous contrast. Multiplanar CT image reconstructions of the cervical spine and maxillofacial structures were also generated.  COMPARISON:  Head CT December 01, 2014; cervical MRI June 25, 2013  FINDINGS: CT HEAD FINDINGS  There is mild diffuse atrophy. There is no intracranial mass hemorrhage, extra-axial fluid collection, or midline shift. There is patchy small vessel disease in the centra semiovale bilaterally. 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 There is small vessel disease in the anterior limb of the left  internal capsule, stable as well. Small lacunar infarcts are noted in each thalamus. There is evidence of a small lacunar infarct in the mid left cerebellum, stable. There is small vessel disease in the lower pons bilaterally, stable. There is no new gray-white compartment lesion. No acute infarct evident. The bony calvarium appears intact. The mastoid air cells are clear.  CT MAXILLOFACIAL FINDINGS  There is a slightly displaced fracture of the left mid porch nasal bone. A tiny avulsion off the distal aspect of the nasion is also noted. There is a nondisplaced fracture along the lateral aspect of the anterior left maxillary antrum. There is a subtle nondisplaced fracture of the lateral left maxillary antrum. No other fractures are identified. No dislocation.  There is soft tissue swelling over the left side of the face. There is no intraorbital lesion.  There is a small air-fluid level in the right maxillary antrum. The other paranasal sinuses are clear. The ostiomeatal unit complexes are patent bilaterally. There is slight leftward deviation the nasal septum. There is no nares obstruction.  Salivary glands appear symmetric and normal bilaterally. No adenopathy.  CT CERVICAL SPINE FINDINGS  There is no demonstrable fracture. There is minimal anterolisthesis of C3 on C4. There is minimal retrolisthesis of C4 on C5. There is minimal retrolisthesis of C5 on C6. No other spondylolisthesis. Prevertebral soft tissues and predental space regions are normal. There is moderately severe disc space narrowing at C4-5 and C5-6. There is moderate narrowing at C3-4 and C6-7. There is multilevel facet hypertrophy. No frank disc extrusion or stenosis. There is moderate exit foraminal narrowing at C4-5 and C5-6 bilaterally. No frank disc extrusion or stenosis. Bones are somewhat osteoporotic. There is calcification in each carotid artery.  IMPRESSION: CT head: Atrophy with prior small infarcts and small vessel disease, both  supratentorial and infratentorial. No acute infarct evident. No hemorrhage or mass effect. No extra-axial fluid collections.  CT maxillofacial: Slightly displaced fracture mid portion left nasal bone. Rather subtle fracture along the lateral aspect of the anterior left maxillary antrum. There is also a subtle  nondisplaced fracture of the lateral left maxillary antrum. There is a tiny avulsion along the distal most aspect of the nasion midline. No dislocations. Small air-fluid level right maxillary antrum. Other paranasal sinuses clear. The ostia BB complexes are patent bilaterally. There is mild leftward deviation of the nasal septum.  CT cervical spine: No fracture. Slight spondylolisthesis at several levels is felt to be due to underlying spondylosis. 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 No disc extrusion or stenosis apparent. Bones osteoporotic. Calcification in each carotid artery.   Electronically Signed   By: Lowella Grip III M.D.   On: 05/25/2015 12:42   Ct Cervical Spine Wo Contrast  05/25/2015   CLINICAL DATA:  Pain following fall  EXAM: CT HEAD WITHOUT CONTRAST  CT MAXILLOFACIAL WITHOUT CONTRAST  CT CERVICAL SPINE WITHOUT CONTRAST  TECHNIQUE: Multidetector CT imaging of the head, cervical spine, and maxillofacial structures were performed using the standard protocol without intravenous contrast. Multiplanar CT image reconstructions of the cervical spine and maxillofacial structures were also generated.  COMPARISON:  Head CT December 01, 2014; cervical MRI June 25, 2013  FINDINGS: CT HEAD FINDINGS  There is mild diffuse atrophy. There is no intracranial mass hemorrhage, extra-axial fluid collection, or midline shift. There is patchy small vessel disease in the centra semiovale bilaterally. 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 There is small vessel disease in the anterior limb of the left internal capsule, stable as well. Small  lacunar infarcts are noted in each thalamus. There is evidence of a small lacunar infarct in the mid left cerebellum, stable. There is small vessel disease in the lower pons bilaterally, stable. There is no new gray-white compartment lesion. No acute infarct evident. The bony calvarium appears intact. The mastoid air cells are clear.  CT MAXILLOFACIAL FINDINGS  There is a slightly displaced fracture of the left mid porch nasal bone. A tiny avulsion off the distal aspect of the nasion is also noted. There is a nondisplaced fracture along the lateral aspect of the anterior left maxillary antrum. There is a subtle nondisplaced fracture of the lateral left maxillary antrum. No other fractures are identified. No dislocation.  There is soft tissue swelling over the left side of the face. There is no intraorbital lesion.  There is a small air-fluid level in the right maxillary antrum. The other paranasal sinuses are clear. The ostiomeatal unit complexes are patent bilaterally. There is slight leftward deviation the nasal septum. There is no nares obstruction.  Salivary glands appear symmetric and normal bilaterally. No adenopathy.  CT CERVICAL SPINE FINDINGS  There is no demonstrable fracture. There is minimal anterolisthesis of C3 on C4. There is minimal retrolisthesis of C4 on C5. There is minimal retrolisthesis of C5 on C6. No other spondylolisthesis. Prevertebral soft tissues and predental space regions are normal. There is moderately severe disc space narrowing at C4-5 and C5-6. There is moderate narrowing at C3-4 and C6-7. There is multilevel facet hypertrophy. No frank disc extrusion or stenosis. There is moderate exit foraminal narrowing at C4-5 and C5-6 bilaterally. No frank disc extrusion or stenosis. Bones are somewhat osteoporotic. There is calcification in each carotid artery.  IMPRESSION: CT head: Atrophy with prior small infarcts and small vessel disease, both supratentorial and infratentorial. No acute  infarct evident. No hemorrhage or mass effect. No extra-axial fluid collections.  CT maxillofacial: Slightly displaced fracture mid portion left nasal bone. Rather subtle fracture along the lateral aspect of the  anterior left maxillary antrum. There is also a subtle nondisplaced fracture of the lateral left maxillary antrum. There is a tiny avulsion along the distal most aspect of the nasion midline. No dislocations. Small air-fluid level right maxillary antrum. Other paranasal sinuses clear. The ostia BB complexes are patent bilaterally. There is mild leftward deviation of the nasal septum.  CT cervical spine: No fracture. Slight spondylolisthesis at several levels is felt to be due to underlying spondylosis. 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 No disc extrusion or stenosis apparent. Bones osteoporotic. Calcification in each carotid artery.   Electronically Signed   By: Lowella Grip III M.D.   On: 05/25/2015 12:42   Mr Ankle Left  Wo Contrast  05/30/2015   CLINICAL DATA:  Left ankle pain and swelling fall.  EXAM: MRI OF THE LEFT ANKLE WITHOUT CONTRAST  TECHNIQUE: Multiplanar, multisequence MR imaging of the ankle was performed. No intravenous contrast was administered.  COMPARISON:  None.  FINDINGS: TENDONS  Peroneal: Normal.  Posteromedial: Normal.  Anterior: Normal.  Achilles: There is severe chronic distal Achilles tendinopathy with tendon hypertrophy and what appears to be extensive dystrophic calcification in the tendon. However, there is no edema in or around the Achilles tendon. No edema in Kager's fat pad.  Plantar Fascia: Normal.  LIGAMENTS  Lateral: Intact. There is edema in the soft tissues around the anterior talofibular ligament the ligament appears completely normal. There is a small effusion in the distal talofibular joint space. There is also focal edema in the subcutaneous soft tissues in the anchor lateral aspect of the distal lower leg approximately 5 cm above the tip of  the fibula consistent with a soft tissue contusion.  Medial: Normal.  CARTILAGE  Ankle Joint: Tiny ankle effusion.  Subtalar Joints/Sinus Tarsi: Normal.  Bones: Normal.  No fractures or bone contusions.  IMPRESSION: 1. Soft tissue contusion at the anterior lateral aspect of the lower leg extending over the lateral malleolus and just anterior to the malleolus around the intact anterior talofibular ligament. 2. No osseous abnormalities. 3. Severe chronic distal Achilles tendinosis.   Electronically Signed   By: Lorriane Shire M.D.   On: 05/30/2015 12:17   Ct Maxillofacial Wo Cm  05/25/2015   CLINICAL DATA:  Pain following fall  EXAM: CT HEAD WITHOUT CONTRAST  CT MAXILLOFACIAL WITHOUT CONTRAST  CT CERVICAL SPINE WITHOUT CONTRAST  TECHNIQUE: Multidetector CT imaging of the head, cervical spine, and maxillofacial structures were performed using the standard protocol without intravenous contrast. Multiplanar CT image reconstructions of the cervical spine and maxillofacial structures were also generated.  COMPARISON:  Head CT December 01, 2014; cervical MRI June 25, 2013  FINDINGS: CT HEAD FINDINGS  There is mild diffuse atrophy. There is no intracranial mass hemorrhage, extra-axial fluid collection, or midline shift. There is patchy small vessel disease in the centra semiovale bilaterally. 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 There is small vessel disease in the anterior limb of the left internal capsule, stable as well. Small lacunar infarcts are noted in each thalamus. There is evidence of a small lacunar infarct in the mid left cerebellum, stable. There is small vessel disease in the lower pons bilaterally, stable. There is no new gray-white compartment lesion. No acute infarct evident. The bony calvarium appears intact. The mastoid air cells are clear.  CT MAXILLOFACIAL FINDINGS  There is a slightly displaced fracture of the left mid porch nasal bone. A tiny  avulsion  off the distal aspect of the nasion is also noted. There is a nondisplaced fracture along the lateral aspect of the anterior left maxillary antrum. There is a subtle nondisplaced fracture of the lateral left maxillary antrum. No other fractures are identified. No dislocation.  There is soft tissue swelling over the left side of the face. There is no intraorbital lesion.  There is a small air-fluid level in the right maxillary antrum. The other paranasal sinuses are clear. The ostiomeatal unit complexes are patent bilaterally. There is slight leftward deviation the nasal septum. There is no nares obstruction.  Salivary glands appear symmetric and normal bilaterally. No adenopathy.  CT CERVICAL SPINE FINDINGS  There is no demonstrable fracture. There is minimal anterolisthesis of C3 on C4. There is minimal retrolisthesis of C4 on C5. There is minimal retrolisthesis of C5 on C6. No other spondylolisthesis. Prevertebral soft tissues and predental space regions are normal. There is moderately severe disc space narrowing at C4-5 and C5-6. There is moderate narrowing at C3-4 and C6-7. There is multilevel facet hypertrophy. No frank disc extrusion or stenosis. There is moderate exit foraminal narrowing at C4-5 and C5-6 bilaterally. No frank disc extrusion or stenosis. Bones are somewhat osteoporotic. There is calcification in each carotid artery.  IMPRESSION: CT head: Atrophy with prior small infarcts and small vessel disease, both supratentorial and infratentorial. No acute infarct evident. No hemorrhage or mass effect. No extra-axial fluid collections.  CT maxillofacial: Slightly displaced fracture mid portion left nasal bone. Rather subtle fracture along the lateral aspect of the anterior left maxillary antrum. There is also a subtle nondisplaced fracture of the lateral left maxillary antrum. There is a tiny avulsion along the distal most aspect of the nasion midline. No dislocations. Small air-fluid level right  maxillary antrum. Other paranasal sinuses clear. The ostia BB complexes are patent bilaterally. There is mild leftward deviation of the nasal septum.  CT cervical spine: No fracture. Slight spondylolisthesis at several levels is felt to be due to underlying spondylosis. 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 No disc extrusion or stenosis apparent. Bones osteoporotic. Calcification in each carotid artery.   Electronically Signed   By: Lowella Grip III M.D.   On: 05/25/2015 12:42    CBC  Recent Labs Lab 05/27/15 1611  WBC 6.8  HGB 10.9*  HCT 32.7*  PLT 219  MCV 93.2  MCH 31.1  MCHC 33.3  RDW 12.1    Chemistries   Recent Labs Lab 05/27/15 1611 05/30/15 1903 05/31/15 0749  NA 135 128* 134*  K 3.9 3.6 3.6  CL 94* 89* 94*  CO2 33* 30 30  GLUCOSE 140* 253* 175*  BUN 18 10 7   CREATININE 0.38* 0.64 0.52  CALCIUM 9.5 8.5* 9.3   ------------------------------------------------------------------------------------------------------------------ estimated creatinine clearance is 48.7 mL/min (by C-G formula based on Cr of 0.52). ------------------------------------------------------------------------------------------------------------------ No results for input(s): HGBA1C in the last 72 hours. ------------------------------------------------------------------------------------------------------------------ No results for input(s): CHOL, HDL, LDLCALC, TRIG, CHOLHDL, LDLDIRECT in the last 72 hours. ------------------------------------------------------------------------------------------------------------------ No results for input(s): TSH, T4TOTAL, T3FREE, THYROIDAB in the last 72 hours.  Invalid input(s): FREET3 ------------------------------------------------------------------------------------------------------------------ No results for input(s): VITAMINB12, FOLATE, FERRITIN, TIBC, IRON, RETICCTPCT in the last 72 hours.  Coagulation profile  Recent Labs Lab  05/25/15 1139 05/27/15 1611  INR 1.83* 1.41    No results for input(s): DDIMER in the last 72 hours.  Cardiac Enzymes No results for input(s): CKMB, TROPONINI, MYOGLOBIN in the last 168 hours.  Invalid input(s): CK ------------------------------------------------------------------------------------------------------------------ Invalid input(s): POCBNP  Anden Bartolo D.O. on 05/31/2015 at 10:01 AM  Between 7am to 7pm - Pager - [phone]  After 7pm go to [email or web address] - password [email or web address]  And look for the night coverage person covering for me after hours  [facility] Office  [phone]

## 2015-05-31 NOTE — Anesthesia Postprocedure Evaluation (Signed)
  Anesthesia Post-op Note  Patient: Erin Daniels  Procedure(s) Performed: Procedure(s) (LRB): OPEN REDUCTION INTERNAL FIXATION (ORIF) RIGHT DISTAL RADIUS FRACTURE WITH ALLOGRAFT BONE GRAFTING FOR REPAIR AND RECONSTRUCTION (Right)  Patient Location: PACU  Anesthesia Type: GA combined with regional for post-op pain  Level of Consciousness: awake and alert   Airway and Oxygen Therapy: Patient Spontanous Breathing  Post-op Pain: mild  Post-op Assessment: Post-op Vital signs reviewed, Patient's Cardiovascular Status Stable, Respiratory Function Stable, Patent Airway and No signs of Nausea or vomiting  Last Vitals:  Filed Vitals:   05/31/15 0514  BP: 148/56  Pulse: 88  Temp:   Resp: 17    Post-op Vital Signs: stable   Complications: No apparent anesthesia complications

## 2015-05-31 NOTE — Progress Notes (Signed)
Pt ready for d/c per MD. Cleared by PT/OT, does not need any equipment. Discharge teaching given to pt and husband at bedside, all questions answered. Husband will pick up prescription for hydrocodone at the office. Peripheral IV removed, belongings gathered and removed with husband. Pt will apply insulin pump at home.   Raquel James  05/31/2015

## 2015-05-31 NOTE — Discharge Instructions (Signed)
.  Keep bandage clean and dry.  Call for any problems.  No smoking.  Criteria for driving a car: you should be off your pain medicine for 7-8 hours, able to drive one handed(confident), thinking clearly and feeling able in your judgement to drive. °Continue elevation as it will decrease swelling.  If instructed by MD move your fingers within the confines of the bandage/splint.  Use ice if instructed by your MD. Call immediately for any sudden loss of feeling in your hand/arm or change in functional abilities of the extremity. °.We recommend that you to take vitamin C 1000 mg a day to promote healing. °We also recommend that if you require  pain medicine that you take a stool softener to prevent constipation as most pain medicines will have constipation side effects. We recommend either Peri-Colace or Senokot and recommend that you also consider adding MiraLAX to prevent the constipation affects from pain medicine if you are required to use them. These medicines are over the counter and maybe purchased at a local pharmacy. A cup of yogurt and a probiotic can also be helpful during the recovery process as the medicines can disrupt your intestinal environment. ° °

## 2015-06-01 ENCOUNTER — Encounter (HOSPITAL_COMMUNITY): Payer: Self-pay | Admitting: Orthopedic Surgery

## 2016-11-26 ENCOUNTER — Encounter (HOSPITAL_COMMUNITY): Payer: Self-pay | Admitting: Emergency Medicine

## 2016-11-26 ENCOUNTER — Inpatient Hospital Stay (HOSPITAL_COMMUNITY)
Admission: EM | Admit: 2016-11-26 | Discharge: 2016-11-30 | DRG: 872 | Disposition: A | Discharge status: Home / Self Care | Payer: Medicare Other | Attending: Internal Medicine | Admitting: Internal Medicine

## 2016-11-26 ENCOUNTER — Emergency Department (HOSPITAL_COMMUNITY): Payer: Medicare Other

## 2016-11-26 DIAGNOSIS — E162 Hypoglycemia, unspecified: Secondary | ICD-10-CM | POA: Diagnosis not present

## 2016-11-26 DIAGNOSIS — E785 Hyperlipidemia, unspecified: Secondary | ICD-10-CM | POA: Diagnosis present

## 2016-11-26 DIAGNOSIS — Z9641 Presence of insulin pump (external) (internal): Secondary | ICD-10-CM | POA: Diagnosis present

## 2016-11-26 DIAGNOSIS — M810 Age-related osteoporosis without current pathological fracture: Secondary | ICD-10-CM | POA: Diagnosis present

## 2016-11-26 DIAGNOSIS — I10 Essential (primary) hypertension: Secondary | ICD-10-CM | POA: Diagnosis not present

## 2016-11-26 DIAGNOSIS — E1051 Type 1 diabetes mellitus with diabetic peripheral angiopathy without gangrene: Secondary | ICD-10-CM | POA: Diagnosis present

## 2016-11-26 DIAGNOSIS — E1043 Type 1 diabetes mellitus with diabetic autonomic (poly)neuropathy: Secondary | ICD-10-CM | POA: Diagnosis present

## 2016-11-26 DIAGNOSIS — E1065 Type 1 diabetes mellitus with hyperglycemia: Secondary | ICD-10-CM | POA: Diagnosis present

## 2016-11-26 DIAGNOSIS — E872 Acidosis, unspecified: Secondary | ICD-10-CM

## 2016-11-26 DIAGNOSIS — T383X1A Poisoning by insulin and oral hypoglycemic [antidiabetic] drugs, accidental (unintentional), initial encounter: Secondary | ICD-10-CM | POA: Diagnosis present

## 2016-11-26 DIAGNOSIS — Z8249 Family history of ischemic heart disease and other diseases of the circulatory system: Secondary | ICD-10-CM | POA: Diagnosis not present

## 2016-11-26 DIAGNOSIS — Z96612 Presence of left artificial shoulder joint: Secondary | ICD-10-CM | POA: Diagnosis present

## 2016-11-26 DIAGNOSIS — A419 Sepsis, unspecified organism: Principal | ICD-10-CM | POA: Diagnosis present

## 2016-11-26 DIAGNOSIS — E10319 Type 1 diabetes mellitus with unspecified diabetic retinopathy without macular edema: Secondary | ICD-10-CM | POA: Diagnosis present

## 2016-11-26 DIAGNOSIS — E103593 Type 1 diabetes mellitus with proliferative diabetic retinopathy without macular edema, bilateral: Secondary | ICD-10-CM | POA: Diagnosis present

## 2016-11-26 DIAGNOSIS — Z7982 Long term (current) use of aspirin: Secondary | ICD-10-CM

## 2016-11-26 DIAGNOSIS — E876 Hypokalemia: Secondary | ICD-10-CM | POA: Diagnosis not present

## 2016-11-26 DIAGNOSIS — E7800 Pure hypercholesterolemia, unspecified: Secondary | ICD-10-CM | POA: Diagnosis present

## 2016-11-26 DIAGNOSIS — E039 Hypothyroidism, unspecified: Secondary | ICD-10-CM | POA: Diagnosis present

## 2016-11-26 DIAGNOSIS — Z8673 Personal history of transient ischemic attack (TIA), and cerebral infarction without residual deficits: Secondary | ICD-10-CM | POA: Diagnosis not present

## 2016-11-26 DIAGNOSIS — E108 Type 1 diabetes mellitus with unspecified complications: Secondary | ICD-10-CM | POA: Diagnosis not present

## 2016-11-26 DIAGNOSIS — K3184 Gastroparesis: Secondary | ICD-10-CM | POA: Diagnosis present

## 2016-11-26 DIAGNOSIS — Z79899 Other long term (current) drug therapy: Secondary | ICD-10-CM | POA: Diagnosis not present

## 2016-11-26 DIAGNOSIS — Z85828 Personal history of other malignant neoplasm of skin: Secondary | ICD-10-CM | POA: Diagnosis not present

## 2016-11-26 DIAGNOSIS — Z7901 Long term (current) use of anticoagulants: Secondary | ICD-10-CM

## 2016-11-26 DIAGNOSIS — E10649 Type 1 diabetes mellitus with hypoglycemia without coma: Secondary | ICD-10-CM | POA: Diagnosis present

## 2016-11-26 DIAGNOSIS — I639 Cerebral infarction, unspecified: Secondary | ICD-10-CM | POA: Diagnosis not present

## 2016-11-26 DIAGNOSIS — T68XXXA Hypothermia, initial encounter: Secondary | ICD-10-CM | POA: Diagnosis not present

## 2016-11-26 DIAGNOSIS — Z794 Long term (current) use of insulin: Secondary | ICD-10-CM

## 2016-11-26 LAB — COMPREHENSIVE METABOLIC PANEL
ALBUMIN: 3.6 g/dL (ref 3.5–5.0)
ALT: 17 U/L (ref 14–54)
ANION GAP: 11 (ref 5–15)
AST: 31 U/L (ref 15–41)
Alkaline Phosphatase: 61 U/L (ref 38–126)
BILIRUBIN TOTAL: 0.3 mg/dL (ref 0.3–1.2)
BUN: 21 mg/dL — ABNORMAL HIGH (ref 6–20)
CO2: 24 mmol/L (ref 22–32)
Calcium: 8.7 mg/dL — ABNORMAL LOW (ref 8.9–10.3)
Chloride: 103 mmol/L (ref 101–111)
Creatinine, Ser: 0.55 mg/dL (ref 0.44–1.00)
GFR calc Af Amer: >60 mL/min (ref >60)
GLUCOSE: 82 mg/dL (ref 65–99)
Potassium: 2.8 mmol/L — ABNORMAL LOW (ref 3.5–5.1)
Sodium: 138 mmol/L (ref 135–145)
TOTAL PROTEIN: 7 g/dL (ref 6.5–8.1)

## 2016-11-26 LAB — CBG MONITORING, ED
GLUCOSE-CAPILLARY: 35 mg/dL — AB (ref 65–99)
GLUCOSE-CAPILLARY: 64 mg/dL — AB (ref 65–99)
Glucose-Capillary: 59 mg/dL — ABNORMAL LOW (ref 65–99)
Glucose-Capillary: 89 mg/dL (ref 65–99)

## 2016-11-26 LAB — CK: Total CK: 100 U/L (ref 38–234)

## 2016-11-26 LAB — CBC
HCT: 29.1 % — ABNORMAL LOW (ref 36.0–46.0)
HEMOGLOBIN: 9.9 g/dL — AB (ref 12.0–15.0)
MCH: 31.2 pg (ref 26.0–34.0)
MCHC: 34 g/dL (ref 30.0–36.0)
MCV: 91.8 fL (ref 78.0–100.0)
PLATELETS: 174 10*3/uL (ref 150–400)
RBC: 3.17 MIL/uL — AB (ref 3.87–5.11)
RDW: 12.8 % (ref 11.5–15.5)
WBC: 7.4 10*3/uL (ref 4.0–10.5)

## 2016-11-26 LAB — CBC WITH DIFFERENTIAL/PLATELET
BASOS ABS: 0 10*3/uL (ref 0.0–0.1)
Basophils Relative: 0 %
Eosinophils Absolute: 0.1 10*3/uL (ref 0.0–0.7)
Eosinophils Relative: 1 %
HEMATOCRIT: 34.5 % — AB (ref 36.0–46.0)
HEMOGLOBIN: 11.4 g/dL — AB (ref 12.0–15.0)
LYMPHS PCT: 6 %
Lymphs Abs: 1 10*3/uL (ref 0.7–4.0)
MCH: 31 pg (ref 26.0–34.0)
MCHC: 33 g/dL (ref 30.0–36.0)
MCV: 93.8 fL (ref 78.0–100.0)
Monocytes Absolute: 1.3 10*3/uL — ABNORMAL HIGH (ref 0.1–1.0)
Monocytes Relative: 7 %
NEUTROS PCT: 86 %
Neutro Abs: 14.5 10*3/uL — ABNORMAL HIGH (ref 1.7–7.7)
Platelets: 221 10*3/uL (ref 150–400)
RBC: 3.68 MIL/uL — ABNORMAL LOW (ref 3.87–5.11)
RDW: 12.6 % (ref 11.5–15.5)
WBC: 16.8 10*3/uL — AB (ref 4.0–10.5)

## 2016-11-26 LAB — I-STAT VENOUS BLOOD GAS, ED
ACID-BASE DEFICIT: 3 mmol/L — AB (ref 0.0–2.0)
Bicarbonate: 24 mmol/L (ref 20.0–28.0)
O2 SAT: 85 %
PCO2 VEN: 49.5 mmHg (ref 44.0–60.0)
TCO2: 26 mmol/L (ref 0–100)
pH, Ven: 7.295 (ref 7.250–7.430)
pO2, Ven: 56 mmHg — ABNORMAL HIGH (ref 32.0–45.0)

## 2016-11-26 LAB — I-STAT TROPONIN, ED: TROPONIN I, POC: 0.02 ng/mL (ref 0.00–0.08)

## 2016-11-26 LAB — I-STAT CG4 LACTIC ACID, ED
LACTIC ACID, VENOUS: 4.71 mmol/L — AB (ref 0.5–1.9)
Lactic Acid, Venous: 1.82 mmol/L (ref 0.5–1.9)

## 2016-11-26 LAB — APTT: APTT: 29 s (ref 24–36)

## 2016-11-26 LAB — BRAIN NATRIURETIC PEPTIDE: B Natriuretic Peptide: 23.4 pg/mL (ref 0.0–100.0)

## 2016-11-26 LAB — PROTIME-INR
INR: 1.45
Prothrombin Time: 17.8 seconds — ABNORMAL HIGH (ref 11.4–15.2)

## 2016-11-26 LAB — LIPASE, BLOOD: Lipase: 15 U/L (ref 11–51)

## 2016-11-26 MED ORDER — POTASSIUM CHLORIDE CRYS ER 20 MEQ PO TBCR: 40.0000 meq | EXTENDED_RELEASE_TABLET | Freq: Once | ORAL | Status: DC

## 2016-11-26 MED ORDER — DEXTROSE 50 % IV SOLN
INTRAVENOUS | Status: AC
  Administered 2016-11-26: 50 mL
  Filled 2016-11-26: qty 50

## 2016-11-26 MED ORDER — SODIUM CHLORIDE 0.9 % IV BOLUS (SEPSIS)
1000.0000 mL | Freq: Once | INTRAVENOUS | Status: AC
  Administered 2016-11-26: 1000 mL via INTRAVENOUS

## 2016-11-26 MED ORDER — DEXTROSE 10 % IV SOLN
INTRAVENOUS | Status: DC
  Administered 2016-11-26: 50 mL/h via INTRAVENOUS

## 2016-11-26 MED ORDER — PIPERACILLIN-TAZOBACTAM 3.375 G IVPB 30 MIN
3.3750 g | Freq: Once | INTRAVENOUS | Status: AC
  Administered 2016-11-26: 3.375 g via INTRAVENOUS
  Filled 2016-11-26: qty 50

## 2016-11-26 MED ORDER — DEXTROSE 50 % IV SOLN
1.0000 | INTRAVENOUS | Status: AC
  Administered 2016-11-26: 50 mL via INTRAVENOUS

## 2016-11-26 MED ORDER — DEXTROSE 50 % IV SOLN
INTRAVENOUS | Status: AC
  Filled 2016-11-26: qty 50

## 2016-11-26 MED ORDER — ONDANSETRON HCL 4 MG/2ML IJ SOLN: 4.0000 mg | Freq: Four times a day (QID) | INTRAMUSCULAR | Status: DC | PRN

## 2016-11-26 MED ORDER — DEXTROSE 50 % IV SOLN
INTRAVENOUS | Status: AC
Start: 2016-11-26 — End: 2016-11-26
  Administered 2016-11-26: 50 mL
  Filled 2016-11-26: qty 50

## 2016-11-26 MED ORDER — VANCOMYCIN HCL IN DEXTROSE 750-5 MG/150ML-% IV SOLN
750.0000 mg | Freq: Two times a day (BID) | INTRAVENOUS | Status: DC
  Filled 2016-11-26: qty 150

## 2016-11-26 MED ORDER — DEXTROSE 50 % IV SOLN
1.0000 | INTRAVENOUS | Status: DC | PRN
  Administered 2016-11-27: 50 mL via INTRAVENOUS
  Filled 2016-11-26: qty 50

## 2016-11-26 MED ORDER — SODIUM CHLORIDE 0.9% FLUSH
3.0000 mL | Freq: Two times a day (BID) | INTRAVENOUS | Status: DC
  Administered 2016-11-26 – 2016-11-29 (×4): 3 mL via INTRAVENOUS

## 2016-11-26 MED ORDER — VANCOMYCIN HCL IN DEXTROSE 1-5 GM/200ML-% IV SOLN
1000.0000 mg | Freq: Once | INTRAVENOUS | Status: AC
  Administered 2016-11-26: 1000 mg via INTRAVENOUS
  Filled 2016-11-26: qty 200

## 2016-11-26 MED ORDER — ENOXAPARIN SODIUM 40 MG/0.4ML ~~LOC~~ SOLN
40.0000 mg | SUBCUTANEOUS | Status: DC
  Administered 2016-11-27: 40 mg via SUBCUTANEOUS
  Filled 2016-11-26 (×2): qty 0.4

## 2016-11-26 MED ORDER — DEXTROSE-NACL 5-0.45 % IV SOLN
INTRAVENOUS | Status: DC
Start: 2016-11-26 — End: 2016-11-26
  Administered 2016-11-26: 22:00:00 via INTRAVENOUS

## 2016-11-26 MED ORDER — PIPERACILLIN-TAZOBACTAM 3.375 G IVPB
3.3750 g | Freq: Three times a day (TID) | INTRAVENOUS | Status: DC
  Administered 2016-11-27: 3.375 g via INTRAVENOUS
  Filled 2016-11-26: qty 50

## 2016-11-26 MED ORDER — ONDANSETRON HCL 4 MG PO TABS: 4.0000 mg | ORAL_TABLET | Freq: Four times a day (QID) | ORAL | Status: DC | PRN

## 2016-11-26 MED ORDER — SODIUM CHLORIDE 0.9 % IV SOLN
30.0000 meq | Freq: Once | INTRAVENOUS | Status: AC
  Administered 2016-11-26: 30 meq via INTRAVENOUS
  Filled 2016-11-26: qty 15

## 2016-11-26 NOTE — ED Notes (Signed)
[place], MD at bedside

## 2016-11-26 NOTE — ED Notes (Addendum)
Hospitalist [place] at bedside

## 2016-11-26 NOTE — ED Notes (Signed)
Pt back from CT. No distress observed. 

## 2016-11-26 NOTE — H&P (Signed)
History and Physical    Erin Daniels WUJ:[id number] DOB: Dec 24, 1945 DOA: 11/26/2016  PCP: Erin Pitch, MD  Patient coming from: Home  Chief Complaint: found down  HPI: Erin Daniels is a 71 y.o. female with medical history significant of Stroke on coumadin ppx, osteoporosis, hypothyroidism, HTN, gastroparesis, DM2 with neuropathy and retinopathy who presents after she was found down.  The patient was awake to voice, but quickly fell back asleep.  Reported that she was feeling better and in no pain.  Her husband was present and was the primary historian.  Apparently, [name] ran out of her insulin pump sites and she borrowed one from a friend until she could get a refill.  She looked up on the internet how to use an alternate site for pump, as this brand was not the correct brand for her pump.  Her husband thinks she must have misunderstood the directions because she primed the line prior to hooking up the pump, and then the machine prompted her to prime it again. At this time, her husband went to take a nap.  When he woke up about 1.5 hours later, he could not find his wife anywhere, until he noticed her on the floor of the kitchen.  When EMS arrived, her CBG was 10.  She has been getting amps of D50 and D51/2NS and her BS continues to drop below 50.  She is more alert now.  Her husband does not know her medical history completely.    ED Course: On admission, there was concern for sepsis with elevated lactate, low temperature, acidosis on ABG and WBC of 16.8.  She was initially treated as a Code Sepsis.  CT head showed no acute abnormality.   Review of Systems: Unable to review as patient is somnolent, awoke to voice, but cannot answer many questions.  Her husband reports her being at her normal state of health.   Reviewed last PCP note from [facility] - Dr. Lovie Daniels, medical history recorded there similar with addition of migraine, and no history of seizure.  Surgical history  appears similar.  All history should be confirmed with patient once she is more awake.    Past Medical History:  Diagnosis Date  . At high risk for falls   . Basal cell carcinoma    right upper leg  . Complication of anesthesia    difficult to arouse  . Diabetes mellitus without complication (HCC)    Type I  . Diabetic neuropathy ([place])   . Diabetic retinopathy ([place])   . Dizziness   . Gastroparesis   . Herniated disc, cervical   . Hypercholesteremia   . Hypertension   . Hypotensive episode   . Hypothyroidism   . Insulin pump in place   . Myofascial muscle pain   . Numbness of arm    left  . Osteoporosis   . Poor vision   . Seizures ([place])   . Slurred speech   . Stroke (HCC)    x5  . Vertigo   . Wears glasses     Past Surgical History:  Procedure Laterality Date  . APPENDECTOMY    . BILATERAL CARPAL TUNNEL RELEASE    . COLONOSCOPY    . EYE SURGERY    . HEMORROIDECTOMY    . HERNIA REPAIR    . JOINT REPLACEMENT    . OPEN REDUCTION INTERNAL FIXATION (ORIF) DISTAL RADIAL FRACTURE Right 05/29/2015   Procedure: OPEN REDUCTION INTERNAL FIXATION (ORIF) RIGHT DISTAL RADIUS FRACTURE  WITH ALLOGRAFT BONE GRAFTING FOR REPAIR AND RECONSTRUCTION;  Surgeon: Erin Kaufman, MD;  Location: [facility];  Service: Orthopedics;  Laterality: Right;  . SHOULDER ARTHROSCOPY Left   . SHOULDER HEMI-ARTHROPLASTY Left   . SYNOVECTOMY    . TONSILLECTOMY    . TRIGGER FINGER RELEASE    . TUBAL LIGATION     Husband reports non smoker, this is confirmed in recent PCP note.   reports that she has never smoked. She has never used smokeless tobacco. She reports that she does not drink alcohol or use drugs.  Confirmed in recent PCP note.  Allergies  Allergen Reactions  . Codeine Nausea Only    Can take Vicodin and Percocet   . Meperidine Nausea Only  . Metoclopramide Hcl Nausea Only  . Oxycodone Nausea Only  . Stadol  [Butorphanol] Nausea Only  . Tussionex Pennkinetic Er [Hydrocod Polst-Cpm Polst  Er] Other (See Comments)    Filled patient with sugar   . Carbocaine  [Mepivacaine Hcl] Palpitations   Family history: Reviewed PCP note and mentions HTN, CHF, hypothyroidism and heart failure.  Husband can only confirm heart disease in the father.     Reviewed PCP note, does not mention oxycodone or tramadol.  Reviewed [place] narcotic database shows no controlled rx in the last year.  Prior to Admission medications   Medication Sig Start Date End Date Taking? Authorizing Provider  Ascorbic Acid (VITAMIN C PO) Take 1 tablet by mouth daily with breakfast.    Historical Provider, MD  aspirin 325 MG EC tablet Take 325 mg by mouth daily with breakfast.    Historical Provider, MD  butalbital-acetaminophen-caffeine (FIORICET, ESGIC) 50-325-40 MG tablet Take 0.5-1 tablets by mouth 3 (three) times daily as needed for headache.    Historical Provider, MD  Calcium Carb-Cholecalciferol (CALCIUM 600 + D PO) Take 1 tablet by mouth every evening.    Historical Provider, MD  carboxymethylcellulose (REFRESH PLUS) 0.5 % SOLN Take 1-2 drops by mouth daily after breakfast.    Historical Provider, MD  doxycycline (ORACEA) 40 MG capsule Take 40 mg by mouth every evening.    Historical Provider, MD  folic acid (FOLVITE) 1 MG tablet Take 1 mg by mouth daily.    Historical Provider, MD  gabapentin (NEURONTIN) 300 MG capsule Take 300 mg by mouth 3 (three) times daily. Takes in the morning, at dinner, and at bedtime    Historical Provider, MD  hydrochlorothiazide (MICROZIDE) 12.5 MG capsule Take 12.5 mg by mouth daily with breakfast.    Historical Provider, MD  Insulin Human (INSULIN PUMP) SOLN Inject 0.4-4 each into the skin continuous. Uses Novolog    Historical Provider, MD  IRON, FERROUS GLUCONATE, PO Take 1 tablet by mouth every evening.    Historical Provider, MD  losartan (COZAAR) 100 MG tablet Take 100 mg by mouth every evening.    Historical Provider, MD  Multiple Vitamin (MULTIVITAMIN WITH MINERALS) TABS tablet  Take 1 tablet by mouth every evening.    Historical Provider, MD  oxyCODONE (ROXICODONE) 5 MG immediate release tablet Take 1 tablet (5 mg total) by mouth every 6 (six) hours as needed for severe pain. 05/25/15   Montine Circle, PA-C  polyethylene glycol (MIRALAX / GLYCOLAX) packet Take 17 g by mouth every other day.    Historical Provider, MD  promethazine (PHENERGAN) 25 MG suppository Place 25 mg rectally every 6 (six) hours as needed for nausea or vomiting.    Historical Provider, MD  simvastatin (ZOCOR) 20 MG tablet Take  20 mg by mouth every evening.    Historical Provider, MD  tiZANidine (ZANAFLEX) 2 MG tablet Take 2 mg by mouth at bedtime.    Historical Provider, MD  traMADol (ULTRAM) 50 MG tablet Take 50 mg by mouth every 6 (six) hours as needed.    Historical Provider, MD  venlafaxine (EFFEXOR) 75 MG tablet Take 75 mg by mouth 2 (two) times daily.    Historical Provider, MD  VITAMIN E PO Take 1 tablet by mouth every evening.    Historical Provider, MD  warfarin (COUMADIN) 5 MG tablet Take 10 mg by mouth daily at 6 PM.    Historical Provider, MD    Physical Exam: Vitals:   11/26/16 2203 11/26/16 2210 11/26/16 2230 11/26/16 2245  BP:   (!) 127/51 (!) 114/50  Pulse:   84 78  Resp:   18 17  Temp: (!) 94.1 F (34.5 C) (!) 94.1 F (34.5 C)    TempSrc: Rectal     SpO2:   92% 98%  Weight:      Height:        Constitutional: Lying in bed, sleeping, awake to voice.  Vitals:   11/26/16 2203 11/26/16 2210 11/26/16 2230 11/26/16 2245  BP:   (!) 127/51 (!) 114/50  Pulse:   84 78  Resp:   18 17  Temp: (!) 94.1 F (34.5 C) (!) 94.1 F (34.5 C)    TempSrc: Rectal     SpO2:   92% 98%  Weight:      Height:       Eyes: PERRL, lids and conjunctivae normal ENMT: Mucous membranes are dry. Normal dentition.  Neck: normal, supple, no lymphadenopathy Respiratory: clear to auscultation bilaterally, no wheezing, no crackles. Normal respiratory effort.  Cardiovascular: Regular rate and normal  rhythm, no murmurs / rubs / gallops. No extremity edema. 2+ pedal pulses.  Abdomen: +BS, no apparent tenderness, non distended.  Insulin pump SQ site on LLQ Musculoskeletal: no clubbing / cyanosis. Normal muscle tone.  Skin: no rashes, lesions, ulcers.  Varicose veins on lower extremities.   Neurologic: She is very groggy, obeys some commands, awake to voice.  GCS 12-13  Labs on Admission: I have personally reviewed following labs and imaging studies  CBC:  Recent Labs Lab 11/26/16 2027  WBC 16.8*  NEUTROABS 14.5*  HGB 11.4*  HCT 34.5*  MCV 93.8  PLT 458   Basic Metabolic Panel:  Recent Labs Lab 11/26/16 2027  NA 138  K 2.8*  CL 103  CO2 24  GLUCOSE 82  BUN 21*  CREATININE 0.55  CALCIUM 8.7*   GFR: Estimated Creatinine Clearance (by C-G formula based on SCr of 0.55 mg/dL) Female: 54.6 mL/min Female: 64.2 mL/min Liver Function Tests:  Recent Labs Lab 11/26/16 2027  AST 31  ALT 17  ALKPHOS 61  BILITOT 0.3  PROT 7.0  ALBUMIN 3.6    Recent Labs Lab 11/26/16 2027  LIPASE 15   No results for input(s): AMMONIA in the last 168 hours. Coagulation Profile:  Recent Labs Lab 11/26/16 2027  INR 1.45   Cardiac Enzymes:  Recent Labs Lab 11/26/16 2027  CKTOTAL 100   BNP (last 3 results) No results for input(s): PROBNP in the last 8760 hours. HbA1C: No results for input(s): HGBA1C in the last 72 hours. CBG:  Recent Labs Lab 11/26/16 1953 11/26/16 2040 11/26/16 2149 11/26/16 2245  GLUCAP 59* 89 35* 64*   Lipid Profile: No results for input(s): CHOL, HDL, LDLCALC, TRIG, CHOLHDL,  LDLDIRECT in the last 72 hours. Thyroid Function Tests: No results for input(s): TSH, T4TOTAL, FREET4, T3FREE, THYROIDAB in the last 72 hours. Anemia Panel: No results for input(s): VITAMINB12, FOLATE, FERRITIN, TIBC, IRON, RETICCTPCT in the last 72 hours. Urine analysis:    Component Value Date/Time   COLORURINE Yellow 10/19/2012 1628   APPEARANCEUR Clear 10/19/2012  1628   LABSPEC 1.017 10/19/2012 1628   PHURINE 5.0 10/19/2012 1628   GLUCOSEU >=500 10/19/2012 1628   HGBUR Negative 10/19/2012 1628   BILIRUBINUR Negative 10/19/2012 1628   KETONESUR Negative 10/19/2012 1628   PROTEINUR Negative 10/19/2012 1628   NITRITE Negative 10/19/2012 1628   LEUKOCYTESUR Negative 10/19/2012 1628    Radiological Exams on Admission: Ct Head Wo Contrast  Result Date: 11/26/2016 CLINICAL DATA:  Found down.  Unresponsive.  On Coumadin. EXAM: CT HEAD WITHOUT CONTRAST TECHNIQUE: Contiguous axial images were obtained from the base of the skull through the vertex without intravenous contrast. COMPARISON:  05/25/2015 FINDINGS: Brain: No evidence of acute infarction, hemorrhage, hydrocephalus, extra-axial collection or mass lesion/mass effect. Stable mild to moderate cerebral and cerebellar atrophy. No significant change in appearance of chronic small vessel disease, and multiple old lacunar infarcts involving the basal ganglia and thalami bilaterally. Vascular: No hyperdense vessel or unexpected calcification. Skull: Normal. Negative for fracture or focal lesion. Sinuses/Orbits: No acute finding. Other: None. IMPRESSION: No acute intracranial abnormality. Stable cerebral and cerebellar atrophy, chronic small vessel disease, and old lacunar infarcts. Electronically Signed   By: Earle Gell M.D.   On: 11/26/2016 21:36   Dg Chest Port 1 View  Result Date: 11/26/2016 CLINICAL DATA:  Initial evaluation for hypoglycemia. EXAM: PORTABLE CHEST 1 VIEW COMPARISON:  Prior radiograph from 10/19/2012. FINDINGS: Cardiac and mediastinal silhouettes are stable in size and contour, and remain within normal limits. Lungs are mildly hypoinflated. No focal infiltrates. No pulmonary edema or pleural effusion. No pneumothorax. No acute osseus abnormality. Left shoulder arthroplasty noted. Osteopenia. IMPRESSION: No active cardiopulmonary disease. Electronically Signed   By: Jeannine Boga M.D.   On:  11/26/2016 20:59    EKG: Independently reviewed. Sinus, QTc 477  Assessment/Plan Type 1 diabetes mellitus with hypoglycemia - Improving with treatment of glucose, down to 10 when first evaluated - Leukocytosis, lactic acidosis could be explained by being down for 1 - 1.5 hours - CK 100 - IVF changed to D10 with CBG q hourly and D50 as needed - SDU for monitoring - Ensure adequate diabetes supplies on discharge - Appears to be an incidental OD of the insulin given she was using a non compatible priming device - Swallowing evaluation  Lactic acidosis, leukocytosis, concern for sepsis - She was initiated on IV antibiotics of Zosyn and Vancomycin, which is reasonable in the acute setting - Presented with very low blood sugar, AMS, lactic acidosis, WBC 16 and low temperature - Treat glucose as noted above - Bear hugger to keep temperature > 27F - Trend lactate - Continue IV abx for now - UA pending - BC and UC pending - Trend WBC  Hypokalemia - K is 2.8 - likely related to insulin overdose - Replace IV, 30 meq - Recheck K in the morning and replace as needed    Essential hypertension - BP has been on the low side, 660 - 630 systolic - Holding all oral medications including hctz, losartan  Hypothyroidism - Check TSH - Hold synthroid in the acute setting, recheck when more alert  H/O stroke on coumadin - Husband reports she has been on  for PPx, and I reviewed her PCP notes which seem to agree.  I cannot find any evidence that she has an intracardiac thrombus, Afib or a valve replacement.  Review of her INR levels show that she has been subtherapeutic about 60% of the time in the last few years.  - Hold coumadin, would not do heparin IV overnight, restart coumadin when she is more alert  She is also on gabapentin, iron, tizanadine, venlafaxine which should be restarted when she is more alert.    DVT prophylaxis: Lovenox Code Status: Full, Husband states he will bring in living  will Family Communication: Husband, Bill at bedside Disposition Plan: D/C in 1-2 days if she improves as expected Consults called: None Admission status: Inpatient, SDU   Gilles Chiquito MD [facility] Pager [phone]  If 7PM-7AM, please contact night-coverage [email or web address] Password [facility]  11/26/2016, 11:33 PM

## 2016-11-26 NOTE — ED Triage Notes (Signed)
BIB EMS from home, LKW 4pm, husband came to check on her approx 7pm and found unresponsive on floor initial CBG 10, given D50, CBG up to 235. Last one 109. Pt only responds to pain, snoring RR, potentially aspirated on blood from biting tongue. Takes ASA and coumadin, unknown if head trauma. Current CBG 59, calling MD.

## 2016-11-26 NOTE — ED Notes (Signed)
MD contacted regarding clarification on PO order.

## 2016-11-26 NOTE — ED Notes (Addendum)
Patient transported to CT 

## 2016-11-26 NOTE — ED Notes (Signed)
Pt placed on 2L O2 [place]. MD reports O2 at 88.

## 2016-11-26 NOTE — ED Notes (Signed)
EDP at bedside, currently placing pt on [facility]

## 2016-11-26 NOTE — ED Provider Notes (Signed)
[facility] DEPT Provider Note   CSN: [id number] Arrival date & time: 11/26/16  1943     History   Chief Complaint Chief Complaint  Patient presents with  . Hypoglycemia    HPI Erin Daniels is a 71 y.o. female.  The history is provided by the patient, the spouse and the EMS personnel.  Hypoglycemia  Initial blood sugar:  10 Severity:  Severe Onset quality:  Gradual Duration:  2 hours Timing:  Constant Progression:  Improving Chronicity:  New Diabetic status:  Controlled with insulin Current diabetic therapy:  Pt on insulin pump Context: not decreased oral intake, not diet changes, not ingestion and not recent illness   Relieved by:  IV glucose Ineffective treatments:  None tried Associated symptoms: altered mental status and decreased responsiveness   Associated symptoms: no anxiety, no seizures, no shortness of breath, no sweats, no tremors, no visual change and no vomiting   Risk factors: no drug use     Past Medical History:  Diagnosis Date  . At high risk for falls   . Basal cell carcinoma    right upper leg  . Complication of anesthesia    difficult to arouse  . Diabetes mellitus without complication (HCC)    Type I  . Diabetic neuropathy ([place])   . Diabetic retinopathy ([place])   . Dizziness   . Gastroparesis   . Herniated disc, cervical   . Hypercholesteremia   . Hypertension   . Hypotensive episode   . Hypothyroidism   . Insulin pump in place   . Myofascial muscle pain   . Numbness of arm    left  . Osteoporosis   . Poor vision   . Seizures ([place])   . Slurred speech   . Stroke (HCC)    x5  . Vertigo   . Wears glasses     Patient Active Problem List   Diagnosis Date Noted  . Hypoglycemia 11/26/2016  . Type 1 diabetes mellitus with complication ([place])   . Hyponatremia   . Essential hypertension   . Hyperlipidemia   . Depression   . Right radial fracture 05/29/2015    Past Surgical History:  Procedure Laterality Date  .  APPENDECTOMY    . BILATERAL CARPAL TUNNEL RELEASE    . COLONOSCOPY    . EYE SURGERY    . HEMORROIDECTOMY    . HERNIA REPAIR    . JOINT REPLACEMENT    . OPEN REDUCTION INTERNAL FIXATION (ORIF) DISTAL RADIAL FRACTURE Right 05/29/2015   Procedure: OPEN REDUCTION INTERNAL FIXATION (ORIF) RIGHT DISTAL RADIUS FRACTURE WITH ALLOGRAFT BONE GRAFTING FOR REPAIR AND RECONSTRUCTION;  Surgeon: Roseanne Kaufman, MD;  Location: [facility];  Service: Orthopedics;  Laterality: Right;  . SHOULDER ARTHROSCOPY Left   . SHOULDER HEMI-ARTHROPLASTY Left   . SYNOVECTOMY    . TONSILLECTOMY    . TRIGGER FINGER RELEASE    . TUBAL LIGATION      OB History    No data available       Home Medications    Prior to Admission medications   Medication Sig Start Date End Date Taking? Authorizing Provider  Ascorbic Acid (VITAMIN C PO) Take 1 tablet by mouth daily with breakfast.    Historical Provider, MD  aspirin 325 MG EC tablet Take 325 mg by mouth daily with breakfast.    Historical Provider, MD  butalbital-acetaminophen-caffeine (FIORICET, ESGIC) 50-325-40 MG tablet Take 0.5-1 tablets by mouth 3 (three) times daily as needed for headache.  Historical Provider, MD  Calcium Carb-Cholecalciferol (CALCIUM 600 + D PO) Take 1 tablet by mouth every evening.    Historical Provider, MD  carboxymethylcellulose (REFRESH PLUS) 0.5 % SOLN Take 1-2 drops by mouth daily after breakfast.    Historical Provider, MD  doxycycline (ORACEA) 40 MG capsule Take 40 mg by mouth every evening.    Historical Provider, MD  folic acid (FOLVITE) 1 MG tablet Take 1 mg by mouth daily.    Historical Provider, MD  gabapentin (NEURONTIN) 300 MG capsule Take 300 mg by mouth 3 (three) times daily. Takes in the morning, at dinner, and at bedtime    Historical Provider, MD  hydrochlorothiazide (MICROZIDE) 12.5 MG capsule Take 12.5 mg by mouth daily with breakfast.    Historical Provider, MD  Insulin Human (INSULIN PUMP) SOLN Inject 0.4-4 each into the skin  continuous. Uses Novolog    Historical Provider, MD  IRON, FERROUS GLUCONATE, PO Take 1 tablet by mouth every evening.    Historical Provider, MD  losartan (COZAAR) 100 MG tablet Take 100 mg by mouth every evening.    Historical Provider, MD  Multiple Vitamin (MULTIVITAMIN WITH MINERALS) TABS tablet Take 1 tablet by mouth every evening.    Historical Provider, MD  oxyCODONE (ROXICODONE) 5 MG immediate release tablet Take 1 tablet (5 mg total) by mouth every 6 (six) hours as needed for severe pain. 05/25/15   Montine Circle, PA-C  polyethylene glycol (MIRALAX / GLYCOLAX) packet Take 17 g by mouth every other day.    Historical Provider, MD  promethazine (PHENERGAN) 25 MG suppository Place 25 mg rectally every 6 (six) hours as needed for nausea or vomiting.    Historical Provider, MD  simvastatin (ZOCOR) 20 MG tablet Take 20 mg by mouth every evening.    Historical Provider, MD  tiZANidine (ZANAFLEX) 2 MG tablet Take 2 mg by mouth at bedtime.    Historical Provider, MD  traMADol (ULTRAM) 50 MG tablet Take 50 mg by mouth every 6 (six) hours as needed.    Historical Provider, MD  venlafaxine (EFFEXOR) 75 MG tablet Take 75 mg by mouth 2 (two) times daily.    Historical Provider, MD  VITAMIN E PO Take 1 tablet by mouth every evening.    Historical Provider, MD  warfarin (COUMADIN) 5 MG tablet Take 10 mg by mouth daily at 6 PM.    Historical Provider, MD    Family History No family history on file.  Social History Social History  Substance Use Topics  . Smoking status: Never Smoker  . Smokeless tobacco: Never Used  . Alcohol use No     Allergies   Codeine; Meperidine; Metoclopramide hcl; Oxycodone; Stadol  [butorphanol]; Tussionex pennkinetic er [employer] polst-cpm polst er]; and Carbocaine  [mepivacaine hcl]   Review of Systems Review of Systems  Constitutional: Positive for decreased responsiveness. Negative for chills, diaphoresis and fever.  HENT: Negative for ear pain and sore throat.    Eyes: Negative for pain and visual disturbance.  Respiratory: Negative for cough and shortness of breath.   Cardiovascular: Negative for chest pain and palpitations.  Gastrointestinal: Negative for abdominal pain and vomiting.  Genitourinary: Negative for dysuria and hematuria.  Musculoskeletal: Negative for arthralgias and back pain.  Skin: Negative for color change and rash.  Neurological: Negative for tremors, seizures and syncope.  Psychiatric/Behavioral: The patient is not nervous/anxious.   All other systems reviewed and are negative.    Physical Exam Updated Vital Signs BP (!) 114/50   Pulse  78   Temp (!) 94.1 F (34.5 C)   Resp 17   Ht 5\' 5"  (1.651 m)   Wt 54.4 kg   SpO2 98%   BMI 19.97 kg/m   Physical Exam  Constitutional: She appears well-developed and well-nourished. She appears lethargic. She has a sickly appearance. She appears ill. No distress.  HENT:  Head: Normocephalic and atraumatic.  Eyes: Conjunctivae are normal.  Neck: Neck supple.  Cardiovascular: Normal rate and regular rhythm.   No murmur heard. Pulmonary/Chest: Effort normal and breath sounds normal. No respiratory distress.  Abdominal: Soft. There is no tenderness.  Musculoskeletal: She exhibits no edema.  Neurological: She appears lethargic. No cranial nerve deficit or sensory deficit. GCS eye subscore is 4. GCS verbal subscore is 4. GCS motor subscore is 6.  Skin: Skin is dry.  Psychiatric: She has a normal mood and affect. Her speech is delayed. She exhibits abnormal recent memory.  Nursing note and vitals reviewed.    ED Treatments / Results  Labs (all labs ordered are listed, but only abnormal results are displayed) Labs Reviewed  COMPREHENSIVE METABOLIC PANEL - Abnormal; Notable for the following:       Result Value   Potassium 2.8 (*)    BUN 21 (*)    Calcium 8.7 (*)    All other components within normal limits  CBC WITH DIFFERENTIAL/PLATELET - Abnormal; Notable for the  following:    WBC 16.8 (*)    RBC 3.68 (*)    Hemoglobin 11.4 (*)    HCT 34.5 (*)    Neutro Abs 14.5 (*)    Monocytes Absolute 1.3 (*)    All other components within normal limits  PROTIME-INR - Abnormal; Notable for the following:    Prothrombin Time 17.8 (*)    All other components within normal limits  CBG MONITORING, ED - Abnormal; Notable for the following:    Glucose-Capillary 59 (*)    All other components within normal limits  I-STAT CG4 LACTIC ACID, ED - Abnormal; Notable for the following:    Lactic Acid, Venous 4.71 (*)    All other components within normal limits  I-STAT VENOUS BLOOD GAS, ED - Abnormal; Notable for the following:    pO2, Ven 56.0 (*)    Acid-base deficit 3.0 (*)    All other components within normal limits  CBG MONITORING, ED - Abnormal; Notable for the following:    Glucose-Capillary 35 (*)    All other components within normal limits  CBG MONITORING, ED - Abnormal; Notable for the following:    Glucose-Capillary 64 (*)    All other components within normal limits  CULTURE, BLOOD (ROUTINE X 2)  CULTURE, BLOOD (ROUTINE X 2)  LIPASE, BLOOD  BRAIN NATRIURETIC PEPTIDE  APTT  CK  BLOOD GAS, VENOUS  URINALYSIS, ROUTINE W REFLEX MICROSCOPIC  CBC  CREATININE, SERUM  BASIC METABOLIC PANEL  PROTIME-INR  I-STAT TROPOININ, ED  CBG MONITORING, ED  I-STAT CG4 LACTIC ACID, ED    EKG  EKG Interpretation None       Radiology Ct Head Wo Contrast  Result Date: 11/26/2016 CLINICAL DATA:  Found down.  Unresponsive.  On Coumadin. EXAM: CT HEAD WITHOUT CONTRAST TECHNIQUE: Contiguous axial images were obtained from the base of the skull through the vertex without intravenous contrast. COMPARISON:  05/25/2015 FINDINGS: Brain: No evidence of acute infarction, hemorrhage, hydrocephalus, extra-axial collection or mass lesion/mass effect. Stable mild to moderate cerebral and cerebellar atrophy. No significant change in appearance of chronic small  vessel disease,  and multiple old lacunar infarcts involving the basal ganglia and thalami bilaterally. Vascular: No hyperdense vessel or unexpected calcification. Skull: Normal. Negative for fracture or focal lesion. Sinuses/Orbits: No acute finding. Other: None. IMPRESSION: No acute intracranial abnormality. Stable cerebral and cerebellar atrophy, chronic small vessel disease, and old lacunar infarcts. Electronically Signed   By: Earle Gell M.D.   On: 11/26/2016 21:36   Dg Chest Port 1 View  Result Date: 11/26/2016 CLINICAL DATA:  Initial evaluation for hypoglycemia. EXAM: PORTABLE CHEST 1 VIEW COMPARISON:  Prior radiograph from 10/19/2012. FINDINGS: Cardiac and mediastinal silhouettes are stable in size and contour, and remain within normal limits. Lungs are mildly hypoinflated. No focal infiltrates. No pulmonary edema or pleural effusion. No pneumothorax. No acute osseus abnormality. Left shoulder arthroplasty noted. Osteopenia. IMPRESSION: No active cardiopulmonary disease. Electronically Signed   By: Jeannine Boga M.D.   On: 11/26/2016 20:59    Procedures Procedures (including critical care time)  Medications Ordered in ED Medications  vancomycin (VANCOCIN) IVPB 750 mg/150 ml premix (not administered)  piperacillin-tazobactam (ZOSYN) IVPB 3.375 g (not administered)  potassium chloride 30 mEq in sodium chloride 0.9 % 265 mL (KCL MULTIRUN) IVPB (30 mEq Intravenous Given 11/26/16 2236)  enoxaparin (LOVENOX) injection 40 mg (not administered)  sodium chloride flush (NS) 0.9 % injection 3 mL (3 mLs Intravenous Given 11/26/16 2333)  ondansetron (ZOFRAN) tablet 4 mg (not administered)    Or  ondansetron (ZOFRAN) injection 4 mg (not administered)  dextrose 10 % infusion (50 mL/hr Intravenous New Bag/Given 11/26/16 2329)  dextrose 50 % solution 50 mL (not administered)  dextrose 50 % solution (50 mLs  Given 11/26/16 1959)  piperacillin-tazobactam (ZOSYN) IVPB 3.375 g (0 g Intravenous Stopped 11/26/16 2050)    vancomycin (VANCOCIN) IVPB 1000 mg/200 mL premix (0 mg Intravenous Stopped 11/26/16 2158)  sodium chloride 0.9 % bolus 1,000 mL (0 mLs Intravenous Stopped 11/26/16 2133)  dextrose 50 % solution (50 mLs  Given 11/26/16 2159)  dextrose 50 % solution 50 mL (50 mLs Intravenous Given 11/26/16 2252)  sodium chloride 0.9 % bolus 1,000 mL (1,000 mLs Intravenous New Bag/Given 11/26/16 2252)     Initial Impression / Assessment and Plan / ED Course  I have reviewed the triage vital signs and the nursing notes.  Pertinent labs & imaging results that were available during my care of the patient were reviewed by me and considered in my medical decision making (see chart for details).     71 year old female who is insulin-dependent diabetic presents in the setting of hypoglycemia. Patient uses insulin pump at home and recently attempted to use new introducer device today. Patient last seen normal at 431 husband was going to take a nap. Patient was just starting her insulin pump and husband woke up an hour and a half later to find patient minimally responsive on floor of kitchen. EMS found patient to have significant hypoglycemia which was improving with IV insulin. Insulin pump was discontinued.  On arrival patient was lethargic and hemodynamically stable. Patient found to be hypothermic and bear hugger was placed. Initial glucose decreased and patient given amp of D50. Labs revealed lactic acid acidosis and code sepsis was initiated with IV fluids and antibiotics (vancomycin and Zosyn) given. Patient continued to have recurrent hypoglycemia and was given additional amps of D50 as well as started on a D5 infusion. Patient additionally found to have hypokalemia which was repleted by IV. Chest x-ray and CT head without significant abnormality and no obvious etiology  of patient's infection.  I believe symptoms are likely related to new insulin pump and likely inadvertent excess insulin. Due to continued hypoglycemia  patient will require mesh and a medicine for further management of this condition was stable at time of transfer of care.  Final Clinical Impressions(s) / ED Diagnoses   Final diagnoses:  Hypoglycemia  Sepsis, due to unspecified organism ([place])  Lactic acid acidosis  Hypothermia, initial encounter  Hypokalemia    New Prescriptions New Prescriptions   No medications on file     Esaw Grandchild, MD 11/26/16 2336    Gwenyth Allegra Tegeler, MD 11/27/16 2008

## 2016-11-26 NOTE — ED Notes (Signed)
CBG 64

## 2016-11-26 NOTE — Progress Notes (Signed)
Pharmacy Antibiotic Note  Erin Daniels is a 71 y.o. female admitted on 11/26/2016 with sepsis.  Pharmacy has been consulted for vancomycin and zosyn dosing.  Patient received vancomycin 1g and and zosyn 3.375g IV once in the ED.  Plan: Vancomycin 750mg  IV every 12 hours.  Goal trough 15-20 mcg/mL. Zosyn 3.375g IV q8h (4 hour infusion).  Monitor culture data, renal function and clinical course VT at SS prn  Height: 5\' 5"  (165.1 cm) Weight: 120 lb (54.4 kg) IBW/kg (Calculated) : 57  Temp (24hrs), Avg:91.9 F (33.3 C), Min:91.9 F (33.3 C), Max:91.9 F (33.3 C)  No results for input(s): WBC, CREATININE, LATICACIDVEN, VANCOTROUGH, VANCOPEAK, VANCORANDOM, GENTTROUGH, GENTPEAK, GENTRANDOM, TOBRATROUGH, TOBRAPEAK, TOBRARND, AMIKACINPEAK, AMIKACINTROU, AMIKACIN in the last 168 hours.  CrCl cannot be calculated (Patient's most recent lab result is older than the maximum 21 days allowed.).    Allergies  Allergen Reactions  . Codeine Nausea Only    Can take Vicodin and Percocet   . Meperidine Nausea Only  . Metoclopramide Hcl Nausea Only  . Oxycodone Nausea Only  . Stadol  [Butorphanol] Nausea Only  . Tussionex Pennkinetic Er [Hydrocod Polst-Cpm Polst Er] Other (See Comments)    Filled patient with sugar   . Carbocaine  [Mepivacaine Hcl] Palpitations     Andrey Cota. Diona Foley, PharmD, BCPS Clinical Pharmacist [phone] 11/26/2016 8:02 PM

## 2016-11-27 ENCOUNTER — Encounter (HOSPITAL_COMMUNITY): Payer: Self-pay | Admitting: Emergency Medicine

## 2016-11-27 DIAGNOSIS — E872 Acidosis: Secondary | ICD-10-CM

## 2016-11-27 DIAGNOSIS — E876 Hypokalemia: Secondary | ICD-10-CM

## 2016-11-27 LAB — PROTIME-INR
INR: 1.62
Prothrombin Time: 19.5 seconds — ABNORMAL HIGH (ref 11.4–15.2)

## 2016-11-27 LAB — BASIC METABOLIC PANEL
Anion gap: 7 (ref 5–15)
BUN: 11 mg/dL (ref 6–20)
CO2: 23 mmol/L (ref 22–32)
Calcium: 8.4 mg/dL — ABNORMAL LOW (ref 8.9–10.3)
Chloride: 106 mmol/L (ref 101–111)
Creatinine, Ser: 0.46 mg/dL (ref 0.44–1.00)
GFR calc Af Amer: >60 mL/min (ref >60)
GLUCOSE: 83 mg/dL (ref 65–99)
POTASSIUM: 3.7 mmol/L (ref 3.5–5.1)
Sodium: 136 mmol/L (ref 135–145)

## 2016-11-27 LAB — CBG MONITORING, ED
GLUCOSE-CAPILLARY: 340 mg/dL — AB (ref 65–99)
GLUCOSE-CAPILLARY: 416 mg/dL — AB (ref 65–99)
GLUCOSE-CAPILLARY: 442 mg/dL — AB (ref 65–99)
GLUCOSE-CAPILLARY: 374 mg/dL — AB (ref 65–99)
Glucose-Capillary: 107 mg/dL — ABNORMAL HIGH (ref 65–99)
Glucose-Capillary: 257 mg/dL — ABNORMAL HIGH (ref 65–99)
Glucose-Capillary: 36 mg/dL — CL (ref 65–99)
Glucose-Capillary: 361 mg/dL — ABNORMAL HIGH (ref 65–99)
Glucose-Capillary: 508 mg/dL (ref 65–99)
Glucose-Capillary: 85 mg/dL (ref 65–99)

## 2016-11-27 LAB — CBC
HEMATOCRIT: 31.4 % — AB (ref 36.0–46.0)
HEMOGLOBIN: 10.4 g/dL — AB (ref 12.0–15.0)
MCH: 30.2 pg (ref 26.0–34.0)
MCHC: 33.1 g/dL (ref 30.0–36.0)
MCV: 91.3 fL (ref 78.0–100.0)
Platelets: 204 10*3/uL (ref 150–400)
RBC: 3.44 MIL/uL — ABNORMAL LOW (ref 3.87–5.11)
RDW: 12.7 % (ref 11.5–15.5)
WBC: 8.7 10*3/uL (ref 4.0–10.5)

## 2016-11-27 LAB — TSH: TSH: 0.661 u[IU]/mL (ref 0.350–4.500)

## 2016-11-27 LAB — GLUCOSE, CAPILLARY
GLUCOSE-CAPILLARY: 322 mg/dL — AB (ref 65–99)
Glucose-Capillary: 185 mg/dL — ABNORMAL HIGH (ref 65–99)

## 2016-11-27 LAB — CREATININE, SERUM: CREATININE: 0.42 mg/dL — AB (ref 0.44–1.00)

## 2016-11-27 LAB — MRSA PCR SCREENING: MRSA BY PCR: NEGATIVE

## 2016-11-27 MED ORDER — GABAPENTIN 300 MG PO CAPS
300.0000 mg | ORAL_CAPSULE | Freq: Three times a day (TID) | ORAL | Status: DC
  Administered 2016-11-27 – 2016-11-30 (×9): 300 mg via ORAL
  Filled 2016-11-27 (×9): qty 1

## 2016-11-27 MED ORDER — POLYETHYLENE GLYCOL 3350 17 G PO PACK: 17.0000 g | PACK | ORAL | Status: DC

## 2016-11-27 MED ORDER — OXYCODONE HCL 5 MG PO TABS: 5.0000 mg | ORAL_TABLET | Freq: Four times a day (QID) | ORAL | Status: DC | PRN

## 2016-11-27 MED ORDER — ADULT MULTIVITAMIN W/MINERALS CH
1.0000 | ORAL_TABLET | Freq: Every evening | ORAL | Status: DC
  Administered 2016-11-27 – 2016-11-29 (×3): 1 via ORAL
  Filled 2016-11-27 (×3): qty 1

## 2016-11-27 MED ORDER — INSULIN ASPART 100 UNIT/ML ~~LOC~~ SOLN
0.0000 [IU] | Freq: Three times a day (TID) | SUBCUTANEOUS | Status: DC
Start: 2016-11-27 — End: 2016-11-29
  Administered 2016-11-27: 11 [IU] via SUBCUTANEOUS
  Administered 2016-11-28: 2 [IU] via SUBCUTANEOUS
  Administered 2016-11-28: 11 [IU] via SUBCUTANEOUS
  Administered 2016-11-29: 8 [IU] via SUBCUTANEOUS

## 2016-11-27 MED ORDER — ASPIRIN EC 325 MG PO TBEC
325.0000 mg | DELAYED_RELEASE_TABLET | Freq: Every day | ORAL | Status: DC
  Administered 2016-11-28 – 2016-11-29 (×2): 325 mg via ORAL
  Filled 2016-11-27 (×2): qty 1

## 2016-11-27 MED ORDER — SODIUM CHLORIDE 0.9 % IV SOLN
INTRAVENOUS | Status: DC
  Administered 2016-11-27 – 2016-11-28 (×2): via INTRAVENOUS

## 2016-11-27 MED ORDER — TRAMADOL HCL 50 MG PO TABS: 50.0000 mg | ORAL_TABLET | Freq: Four times a day (QID) | ORAL | Status: DC | PRN

## 2016-11-27 MED ORDER — VENLAFAXINE HCL 75 MG PO TABS
75.0000 mg | ORAL_TABLET | Freq: Two times a day (BID) | ORAL | Status: DC
  Administered 2016-11-27 – 2016-11-30 (×6): 75 mg via ORAL
  Filled 2016-11-27 (×7): qty 1

## 2016-11-27 MED ORDER — INSULIN ASPART 100 UNIT/ML ~~LOC~~ SOLN: 0.0000 [IU] | Freq: Every day | SUBCUTANEOUS | Status: DC

## 2016-11-27 MED ORDER — INSULIN ASPART 100 UNIT/ML ~~LOC~~ SOLN: 3.0000 [IU] | Freq: Three times a day (TID) | SUBCUTANEOUS | Status: DC

## 2016-11-27 MED ORDER — ACETAMINOPHEN 325 MG PO TABS
650.0000 mg | ORAL_TABLET | ORAL | Status: DC | PRN
  Administered 2016-11-27 (×2): 650 mg via ORAL
  Filled 2016-11-27 (×2): qty 2

## 2016-11-27 MED ORDER — LOSARTAN POTASSIUM 50 MG PO TABS
100.0000 mg | ORAL_TABLET | Freq: Every evening | ORAL | Status: DC
  Administered 2016-11-27 – 2016-11-29 (×3): 100 mg via ORAL
  Filled 2016-11-27 (×3): qty 2

## 2016-11-27 MED ORDER — SIMVASTATIN 20 MG PO TABS
20.0000 mg | ORAL_TABLET | Freq: Every evening | ORAL | Status: DC
  Administered 2016-11-27 – 2016-11-29 (×3): 20 mg via ORAL
  Filled 2016-11-27 (×3): qty 1

## 2016-11-27 MED ORDER — WARFARIN SODIUM 10 MG PO TABS
10.0000 mg | ORAL_TABLET | Freq: Every day | ORAL | Status: DC
  Administered 2016-11-27: 10 mg via ORAL
  Filled 2016-11-27: qty 1

## 2016-11-27 MED ORDER — FOLIC ACID 1 MG PO TABS
1.0000 mg | ORAL_TABLET | Freq: Every day | ORAL | Status: DC
  Administered 2016-11-27 – 2016-11-30 (×4): 1 mg via ORAL
  Filled 2016-11-27 (×4): qty 1

## 2016-11-27 MED ORDER — WARFARIN - PHYSICIAN DOSING INPATIENT
Freq: Every day | Status: DC
  Administered 2016-11-27: 17:00:00

## 2016-11-27 MED ORDER — BUTALBITAL-APAP-CAFFEINE 50-325-40 MG PO TABS
0.5000 | ORAL_TABLET | Freq: Three times a day (TID) | ORAL | Status: DC | PRN
  Administered 2016-11-27 – 2016-11-28 (×2): 1 via ORAL
  Filled 2016-11-27 (×2): qty 1

## 2016-11-27 MED ORDER — DEXTROSE 5 % IV SOLN
INTRAVENOUS | Status: DC
  Administered 2016-11-27: 09:00:00 via INTRAVENOUS

## 2016-11-27 MED ORDER — INSULIN GLARGINE 100 UNIT/ML ~~LOC~~ SOLN
7.0000 [IU] | Freq: Every day | SUBCUTANEOUS | Status: DC
  Administered 2016-11-28 – 2016-11-29 (×2): 7 [IU] via SUBCUTANEOUS
  Filled 2016-11-27 (×2): qty 0.07

## 2016-11-27 MED ORDER — INSULIN ASPART 100 UNIT/ML ~~LOC~~ SOLN
2.0000 [IU] | Freq: Three times a day (TID) | SUBCUTANEOUS | Status: DC
  Administered 2016-11-28 – 2016-11-29 (×4): 2 [IU] via SUBCUTANEOUS

## 2016-11-27 MED ORDER — INSULIN GLARGINE 100 UNIT/ML ~~LOC~~ SOLN
12.0000 [IU] | Freq: Once | SUBCUTANEOUS | Status: AC
  Administered 2016-11-27: 12 [IU] via SUBCUTANEOUS
  Filled 2016-11-27: qty 0.12

## 2016-11-27 MED ORDER — ACETAMINOPHEN 325 MG PO TABS: 650.0000 mg | ORAL_TABLET | ORAL | Status: DC | PRN

## 2016-11-27 NOTE — ED Notes (Signed)
MD paged. Pt request medication for headache.

## 2016-11-27 NOTE — Progress Notes (Signed)
[facility] TEAM 1 - Stepdown/ICU TEAM  Erin Daniels  PZW:[id number] DOB: 08-27-1945 DOA: 11/26/2016 PCP: Juluis Pitch, MD    Brief Narrative:  71 y.o. female with history of CVA on coumadin, osteoporosis, hypothyroidism, HTN, gastroparesis, and DM2 with neuropathy and retinopathy who was found down.  The patient was awake to voice, but quickly fell back asleep.  Ms. Whitebread ran out of her insulin pump sites and borrowed one from a friend until she could get a refill.  She looked up on the internet how to use an alternate site for the pump, as this brand was not the correct brand for her pump.  Her husband thinks she must have misunderstood the directions because she primed the line prior to hooking up the pump, and then the machine prompted her to prime it again. About 1.5 hours later he noticed her on the floor of the kitchen.  When EMS arrived, her CBG was 10.    Subjective: The pt is c/o ongoing diffuse tension type HA.  She is alert and oriented.  She denies cp, n/v, sob, or abdom pain.    Assessment & Plan:  Type 1 diabetes mellitus with hypoglycemia - Appears to be an incidental OD of the insulin given she was using a non compatible priming device - educate on pump trouble shooting - infusion set use - utilize lantus and short acting insulin today now that hypoglycemia resolved - may require IV insulin gtt if hyperglycemia does not rapidly resolve - if supplies can be acquired, hope to resume home insulin pump 4/10  Lactic acidosis, leukocytosis, concern for sepsis - due to severe hypoglycemia, obtundation, lying lifeless on floor for ~1hr  Hypokalemia - due to insulin overdose - resolved     Essential hypertension - BP trending up - resume home meds and follow   Hypothyroidism - resume home synthroid   H/O stroke on coumadin - Husband reports she has been on for PPx, and I reviewed her PCP notes which seem to agree.  I cannot find any evidence that she has an  intracardiac thrombus, Afib or a valve replacement.  Review of her INR levels show that she has been subtherapeutic about 60% of the time in the last few years.  - resume coumadin per home dosing    DVT prophylaxis: warfarin  Code Status: FULL CODE Family Communication: no family present at time of exam  Disposition Plan: resume insulin pulmp 4/10  Consultants:  none  Procedures: none  Antimicrobials:  None presently    Objective: Blood pressure (!) 149/56, pulse 87, temperature 98.2 F (36.8 C), temperature source Oral, resp. rate 16, height 5\' 5"  (1.651 m), weight 54.4 kg (120 lb), SpO2 100 %.  Intake/Output Summary (Last 24 hours) at 11/27/16 1056 Last data filed at 11/27/16 1003  Gross per 24 hour  Intake          2594.34 ml  Output                0 ml  Net          2594.34 ml   Filed Weights   11/26/16 1949  Weight: 54.4 kg (120 lb)    Examination: General: No acute respiratory distress Lungs: Clear to auscultation bilaterally without wheezes or crackles Cardiovascular: Regular rate and rhythm without murmur gallop or rub normal S1 and S2 Abdomen: Nontender, nondistended, soft, bowel sounds positive, no rebound, no ascites, no appreciable mass Extremities: No significant cyanosis, clubbing, or edema bilateral lower  extremities  CBC:  Recent Labs Lab 11/26/16 2027 11/26/16 2336 11/27/16 0425  WBC 16.8* 7.4 8.7  NEUTROABS 14.5*  --   --   HGB 11.4* 9.9* 10.4*  HCT 34.5* 29.1* 31.4*  MCV 93.8 91.8 91.3  PLT 221 174 132   Basic Metabolic Panel:  Recent Labs Lab 11/26/16 2027 11/26/16 2336 11/27/16 0425  NA 138  --  136  K 2.8*  --  3.7  CL 103  --  106  CO2 24  --  23  GLUCOSE 82  --  83  BUN 21*  --  11  CREATININE 0.55 0.42* 0.46  CALCIUM 8.7*  --  8.4*   GFR: Estimated Creatinine Clearance (by C-G formula based on SCr of 0.46 mg/dL) Female: 54.6 mL/min Female: 64.2 mL/min  Liver Function Tests:  Recent Labs Lab 11/26/16 2027  AST 31    ALT 17  ALKPHOS 61  BILITOT 0.3  PROT 7.0  ALBUMIN 3.6    Recent Labs Lab 11/26/16 2027  LIPASE 15   Coagulation Profile:  Recent Labs Lab 11/26/16 2027 11/27/16 0425  INR 1.45 1.62    Cardiac Enzymes:  Recent Labs Lab 11/26/16 2027  CKTOTAL 100    HbA1C: Hgb A1c MFr Bld  Date/Time Value Ref Range Status  05/27/2015 04:12 PM 6.2 (H) 4.8 - 5.6 % Final    Comment:    (NOTE)         Pre-diabetes: 5.7 - 6.4         Diabetes: >6.4         Glycemic control for adults with diabetes: <7.0     CBG:  Recent Labs Lab 11/27/16 0431 11/27/16 0609 11/27/16 0757 11/27/16 0858 11/27/16 1013  GLUCAP 85 257* 340* 416* 442*    Recent Results (from the past 240 hour(s))  Blood Culture (routine x 2)     Status: None (Preliminary result)   Collection Time: 11/26/16  8:11 PM  Result Value Ref Range Status   Specimen Description BLOOD LEFT ANTECUBITAL  Final   Special Requests IN PEDIATRIC BOTTLE Blood Culture adequate volume  Final   Culture PENDING  Incomplete   Report Status PENDING  Incomplete     Scheduled Meds: . enoxaparin (LOVENOX) injection  40 mg Subcutaneous Q24H  . folic acid  1 mg Oral Daily  . gabapentin  300 mg Oral TID  . insulin aspart  0-15 Units Subcutaneous TID WC  . insulin aspart  0-5 Units Subcutaneous QHS  . insulin aspart  3 Units Subcutaneous TID WC  . polyethylene glycol  17 g Oral QODAY  . sodium chloride flush  3 mL Intravenous Q12H  . venlafaxine  75 mg Oral BID   Continuous Infusions: . sodium chloride 75 mL/hr at 11/27/16 1003     LOS: 1 day   Cherene Altes, MD [facility] Office  [phone] Pager - Text Page per Shea Evans as per below:  On-Call/Text Page:      [email or web address]      password [email or web address]  If 7PM-7AM, please contact night-coverage [email or web address] Password [email or web address] 11/27/2016, 10:56 AM

## 2016-11-27 NOTE — ED Notes (Signed)
Pt's CBG 508.  Informed Levada Dy, RN.

## 2016-11-27 NOTE — ED Notes (Signed)
Diabetes corrdinator at bedside.

## 2016-11-27 NOTE — ED Notes (Signed)
Pt's CBG 374.  Informed Diabetes Coordinator and Levada Dy, Therapist, sports.

## 2016-11-27 NOTE — Progress Notes (Addendum)
Inpatient Diabetes Program Recommendations  AACE/ADA: New Consensus Statement on Inpatient Glycemic Control (2015)  Target Ranges:  Prepandial:   less than 140 mg/dL      Peak postprandial:   less than 180 mg/dL (1-2 hours)      Critically ill patients:  140 - 180 mg/dL   Review of Glycemic Control  Diabetes history: DM 1 (per Endocrinology  [facility] everywhere) Outpatient Diabetes medications: Insulin Pump Novolog (1:25 at the most, 1 units drops her 80, total daily basal 7.25 units) Current orders for Inpatient glycemic control: Novolog Moderate scale + HS scale + Novolog 3 units meal coverage  Inpatient Diabetes Program Recommendations:   Glucose at Lunchtime 508 mg/dl, now 374 mg/dl. Please consider a BMET now.  Please use a very sensitive custom Novolog scale as 1 units drops patients glucose 80 points D/c HS scale due to sensitivity May have to reduce basal insulin as her total daily basal per insulin pump is 7.25 units.  -Custom Novolog correction scale 0-4 units       120-200  0 unit      201-250  1 units      251-300  2 units      301-350  3 units      351-400  4 units      > 400 call MD  Per Endocrinology note on 09/11/16:  Basal:MN 0.275; 5a 0.3; noon 0.4; 4p 0.3; 7p 0.2; I/C: mn 1:25; 7AM 1:10; 10am 1:20 430pm 1:23 at supper -- she is not using the EZCarb bolus (counts in her head) Insulin On Board: 4 ISF: 80 TDD: 40 units TDD Basal: 7.45   Spoke with patient. Patient used wrong supplies for pump. Will get new supplies in on Wednesday. Patient reports having SQ insulin at home (Lantus and Novolog). Spoke with patient about giving herself basal bolus at home till the correct supplies come in. Patient is agreeable.   MD please order basal bolus dosages at time of d/c for patient to dose at home until supplies come in.  Thanks,  Tama Headings RN, MSN, [facility] Inpatient Diabetes Coordinator Team Pager [phone] (8a-5p)

## 2016-11-27 NOTE — ED Notes (Signed)
Pt placed on and taken off of the bedpan.  Pericare performed and new sheets put on the bed.

## 2016-11-27 NOTE — ED Notes (Signed)
Pt's CBG 442.  Informed Levada Dy, RN.

## 2016-11-27 NOTE — ED Notes (Signed)
Secretary paged Richardean Canal, MD to report CBG. PRN given

## 2016-11-27 NOTE — ED Triage Notes (Signed)
CBG 361 

## 2016-11-27 NOTE — ED Notes (Signed)
Attempted report 

## 2016-11-27 NOTE — ED Notes (Signed)
Pt's CBG 340.  Informed Levada Dy, RN.

## 2016-11-27 NOTE — ED Notes (Signed)
Ordered lunch tray 

## 2016-11-28 DIAGNOSIS — I10 Essential (primary) hypertension: Secondary | ICD-10-CM

## 2016-11-28 DIAGNOSIS — E108 Type 1 diabetes mellitus with unspecified complications: Secondary | ICD-10-CM

## 2016-11-28 DIAGNOSIS — E162 Hypoglycemia, unspecified: Secondary | ICD-10-CM

## 2016-11-28 LAB — CBC
HEMATOCRIT: 31.7 % — AB (ref 36.0–46.0)
HEMOGLOBIN: 10.5 g/dL — AB (ref 12.0–15.0)
MCH: 30.6 pg (ref 26.0–34.0)
MCHC: 33.1 g/dL (ref 30.0–36.0)
MCV: 92.4 fL (ref 78.0–100.0)
Platelets: 190 10*3/uL (ref 150–400)
RBC: 3.43 MIL/uL — AB (ref 3.87–5.11)
RDW: 13.1 % (ref 11.5–15.5)
WBC: 10 10*3/uL (ref 4.0–10.5)

## 2016-11-28 LAB — GLUCOSE, CAPILLARY
GLUCOSE-CAPILLARY: 308 mg/dL — AB (ref 65–99)
GLUCOSE-CAPILLARY: 123 mg/dL — AB (ref 65–99)
GLUCOSE-CAPILLARY: 29 mg/dL — AB (ref 65–99)
Glucose-Capillary: 100 mg/dL — ABNORMAL HIGH (ref 65–99)
Glucose-Capillary: 83 mg/dL (ref 65–99)

## 2016-11-28 LAB — COMPREHENSIVE METABOLIC PANEL
ALBUMIN: 3.4 g/dL — AB (ref 3.5–5.0)
ALT: 22 U/L (ref 14–54)
ANION GAP: 8 (ref 5–15)
AST: 44 U/L — ABNORMAL HIGH (ref 15–41)
Alkaline Phosphatase: 55 U/L (ref 38–126)
BUN: 7 mg/dL (ref 6–20)
CHLORIDE: 103 mmol/L (ref 101–111)
CO2: 25 mmol/L (ref 22–32)
Calcium: 8.5 mg/dL — ABNORMAL LOW (ref 8.9–10.3)
Creatinine, Ser: 0.56 mg/dL (ref 0.44–1.00)
GFR calc Af Amer: >60 mL/min (ref >60)
GFR calc non Af Amer: >60 mL/min (ref >60)
GLUCOSE: 289 mg/dL — AB (ref 65–99)
POTASSIUM: 4.4 mmol/L (ref 3.5–5.1)
Sodium: 136 mmol/L (ref 135–145)
Total Bilirubin: 0.6 mg/dL (ref 0.3–1.2)
Total Protein: 6.1 g/dL — ABNORMAL LOW (ref 6.5–8.1)

## 2016-11-28 LAB — PROTIME-INR
INR: 2.34
Prothrombin Time: 26.1 seconds — ABNORMAL HIGH (ref 11.4–15.2)

## 2016-11-28 MED ORDER — WARFARIN SODIUM 5 MG PO TABS
5.0000 mg | ORAL_TABLET | Freq: Once | ORAL | Status: AC
  Administered 2016-11-28: 5 mg via ORAL
  Filled 2016-11-28: qty 1

## 2016-11-28 MED ORDER — WARFARIN - PHARMACIST DOSING INPATIENT
Freq: Every day | Status: DC
  Administered 2016-11-28: 18:00:00

## 2016-11-28 NOTE — Progress Notes (Addendum)
ANTICOAGULATION CONSULT NOTE - Initial Consult  Pharmacy Consult for coumadin Indication: hx of stroke  Allergies  Allergen Reactions  . Codeine Nausea Only    Can take Vicodin and Percocet   . Meperidine Nausea Only  . Metoclopramide Hcl Nausea Only  . Oxycodone Nausea Only  . Stadol  [Butorphanol] Nausea Only  . Tussionex Pennkinetic Er [Hydrocod Polst-Cpm Polst Er] Other (See Comments)    Filled patient with sugar   . Carbocaine  [Mepivacaine Hcl] Palpitations    Patient Measurements: Height: 5\' 5"  (165.1 cm) Weight: 118 lb 9.6 oz (53.8 kg) IBW/kg (Calculated) : 57  Vital Signs: Temp: 98.4 F (36.9 C) (04/10 1151) Temp Source: Oral (04/10 1151) BP: 159/73 (04/10 1151) Pulse Rate: 92 (04/10 1151)  Labs:  Recent Labs  11/26/16 2027 11/26/16 2336 11/27/16 0425 11/28/16 0905  HGB 11.4* 9.9* 10.4* 10.5*  HCT 34.5* 29.1* 31.4* 31.7*  PLT 221 174 204 190  APTT 29  --   --   --   LABPROT 17.8*  --  19.5* 26.1*  INR 1.45  --  1.62 2.34  CREATININE 0.55 0.42* 0.46 0.56  CKTOTAL 100  --   --   --     Estimated Creatinine Clearance (by C-G formula based on SCr of 0.56 mg/dL) Female: 54 mL/min Female: 63.5 mL/min   Medical History: Past Medical History:  Diagnosis Date  . At high risk for falls   . Basal cell carcinoma    right upper leg  . Complication of anesthesia    difficult to arouse  . Diabetes mellitus without complication (HCC)    Type I  . Diabetic neuropathy ([place])   . Diabetic retinopathy ([place])   . Dizziness   . Gastroparesis   . Herniated disc, cervical   . Hypercholesteremia   . Hypertension   . Hypotensive episode   . Hypothyroidism   . Insulin pump in place   . Myofascial muscle pain   . Numbness of arm    left  . Osteoporosis   . Poor vision   . Seizures ([place])   . Slurred speech   . Stroke (HCC)    x5  . Vertigo   . Wears glasses      Assessment: 70 YOF on coumadin PTA for hx of stroke, INR 1.45 on admission, MD resumed 10 mg  daily. INR 1.62 > 2.34 today after 1 dose of 10 mg on 4/10. Likely discharge tomorrow after insulin pump is readjusted. Pharmacy to manage coumadin dose now.  Goal of Therapy:  INR 2-3 Monitor platelets by anticoagulation protocol: Yes   Plan:  Coumadin 5mg  po x 1 tonight Daily PT/INR D/C lovenox 40 since INR is therapeutic  Likely restart home dose 7.5 mg daily tomorrow.  Maryanna Shape, PharmD, BCPS  Clinical Pharmacist  Pager: [phone]   11/28/2016,4:03 PM

## 2016-11-28 NOTE — Progress Notes (Signed)
PROGRESS NOTE    Erin Daniels  SLH:[id number] DOB: 1946-02-17 DOA: 11/26/2016 PCP: Juluis Pitch, MD   Brief Narrative 71 year old with past medical history of osteoporosis, hypertension, diabetes type 1 with neuropathy and retinopathy, CVA on Coumadin was found unresponsive at home. She states she recently ran out of her insulin at home therefore [employer] insulin pump which was different brand but due to issues with dosing and priming of the line she ended up likely overdosing. She was unresponsive for several hours and when EMS arrived her glucose was noted to be 10. She was brought here for further evaluation   Assessment & Plan:   Active Problems:   Type 1 diabetes mellitus with complication (HCC)   Essential hypertension   Hyperlipidemia   Hypoglycemia  Diabetes mellitus type 1 with hypoglycemia, improved -Secondary to issues with noncompatible device which patient wasn't used to -At this time she is on Lantus 7 units daily along with sliding scale while in the hospital. Her blood sugars were high yesterday but they have not been checked/I will proceed in the EMR. I will let the nurse know to check it and document so appropriate adjustments can be made -Diabetic coordinator has been consulted -Patient is to call her medication provider to ensure it gets delivered this week. She had placed and ordered last week and was told it will be delivered. I have offered to help but she states she can figure it out -She has NovoLog and Lantus supplies at home if she gets discharged tomorrow. All she'll need to know his dosages. -Follow-up with your PCP following Monday. Patient has an appointment  Hypertension -Continue home medications  Hypothyroidism TSH within normal limits Continue Synthroid  History of CVA -Continue Coumadin per home dosing  Leukocytosis have resolved. No need for antibiotics.  DVT prophylaxis: Coumadin Code Status: Full code Family Communication:   None present at bedside. Patient understands well Disposition Plan: If she tolerates diet well today she can be discharged home tomorrow with Lantus and NovoLog if her insulin pump was not delivered  Consultants:   None  Procedures:   None  Antimicrobials:   None   Subjective: Patient states she try to eat as much as he could at lunch but her appetite is still coming back. She understands the risk of using insulin that's not prescribed to her.   Objective: Vitals:   11/28/16 0018 11/28/16 0500 11/28/16 0822 11/28/16 1151  BP: (!) 157/76 (!) 169/66 (!) 149/62 (!) 159/73  Pulse: (!) 102 96 90 92  Resp: 18 18    Temp: 98.4 F (36.9 C) 99.2 F (37.3 C) 98.1 F (36.7 C) 98.4 F (36.9 C)  TempSrc: Oral Oral Oral Oral  SpO2: 97% 100% 99% 100%  Weight:  53.8 kg (118 lb 9.6 oz)    Height:        Intake/Output Summary (Last 24 hours) at 11/28/16 1341 Last data filed at 11/28/16 0836  Gross per 24 hour  Intake          2036.25 ml  Output             1000 ml  Net          1036.25 ml   Filed Weights   11/26/16 1949 11/28/16 0500  Weight: 54.4 kg (120 lb) 53.8 kg (118 lb 9.6 oz)    Examination:  General exam: Appears calm and comfortable  Respiratory system: Clear to auscultation. Respiratory effort normal. Cardiovascular system: S1 & S2 heard,  RRR. No JVD, murmurs, rubs, gallops or clicks. No pedal edema. Gastrointestinal system: Abdomen is nondistended, soft and nontender. No organomegaly or masses felt. Normal bowel sounds heard. Central nervous system: Alert and oriented. No focal neurological deficits. Extremities: Symmetric 5 x 5 power. Skin: No rashes, lesions or ulcers Psychiatry: Judgement and insight appear normal. Mood & affect appropriate.     Data Reviewed:   CBC:  Recent Labs Lab 11/26/16 2027 11/26/16 2336 11/27/16 0425 11/28/16 0905  WBC 16.8* 7.4 8.7 10.0  NEUTROABS 14.5*  --   --   --   HGB 11.4* 9.9* 10.4* 10.5*  HCT 34.5* 29.1* 31.4*  31.7*  MCV 93.8 91.8 91.3 92.4  PLT 221 174 204 101   Basic Metabolic Panel:  Recent Labs Lab 11/26/16 2027 11/26/16 2336 11/27/16 0425 11/28/16 0905  NA 138  --  136 136  K 2.8*  --  3.7 4.4  CL 103  --  106 103  CO2 24  --  23 25  GLUCOSE 82  --  83 289*  BUN 21*  --  11 7  CREATININE 0.55 0.42* 0.46 0.56  CALCIUM 8.7*  --  8.4* 8.5*   GFR: Estimated Creatinine Clearance (by C-G formula based on SCr of 0.56 mg/dL) Female: 54 mL/min Female: 63.5 mL/min Liver Function Tests:  Recent Labs Lab 11/26/16 2027 11/28/16 0905  AST 31 44*  ALT 17 22  ALKPHOS 61 55  BILITOT 0.3 0.6  PROT 7.0 6.1*  ALBUMIN 3.6 3.4*    Recent Labs Lab 11/26/16 2027  LIPASE 15   No results for input(s): AMMONIA in the last 168 hours. Coagulation Profile:  Recent Labs Lab 11/26/16 2027 11/27/16 0425 11/28/16 0905  INR 1.45 1.62 2.34   Cardiac Enzymes:  Recent Labs Lab 11/26/16 2027  CKTOTAL 100   BNP (last 3 results) No results for input(s): PROBNP in the last 8760 hours. HbA1C: No results for input(s): HGBA1C in the last 72 hours. CBG:  Recent Labs Lab 11/27/16 1057 11/27/16 1219 11/27/16 1348 11/27/16 1709 11/27/16 2125  GLUCAP 508* 374* 361* 322* 185*   Lipid Profile: No results for input(s): CHOL, HDL, LDLCALC, TRIG, CHOLHDL, LDLDIRECT in the last 72 hours. Thyroid Function Tests:  Recent Labs  11/27/16 0425  TSH 0.661   Anemia Panel: No results for input(s): VITAMINB12, FOLATE, FERRITIN, TIBC, IRON, RETICCTPCT in the last 72 hours. Sepsis Labs:  Recent Labs Lab 11/26/16 2044 11/26/16 2348  LATICACIDVEN 4.71* 1.82    Recent Results (from the past 240 hour(s))  Blood Culture (routine x 2)     Status: None (Preliminary result)   Collection Time: 11/26/16  8:11 PM  Result Value Ref Range Status   Specimen Description BLOOD LEFT ANTECUBITAL  Final   Special Requests IN PEDIATRIC BOTTLE Blood Culture adequate volume  Final   Culture NO GROWTH 2  DAYS  Final   Report Status PENDING  Incomplete  Blood Culture (routine x 2)     Status: None (Preliminary result)   Collection Time: 11/26/16  8:39 PM  Result Value Ref Range Status   Specimen Description BLOOD RIGHT ARM  Final   Special Requests   Final    AEROBIC BOTTLE ONLY Blood Culture results may not be optimal due to an inadequate volume of blood received in culture bottles   Culture NO GROWTH 2 DAYS  Final   Report Status PENDING  Incomplete  MRSA PCR Screening     Status: None   Collection Time:  11/27/16  5:07 PM  Result Value Ref Range Status   MRSA by PCR NEGATIVE NEGATIVE Final    Comment:        The GeneXpert MRSA Assay (FDA approved for NASAL specimens only), is one component of a comprehensive MRSA colonization surveillance program. It is not intended to diagnose MRSA infection nor to guide or monitor treatment for MRSA infections.          Radiology Studies: Ct Head Wo Contrast  Result Date: 11/26/2016 CLINICAL DATA:  Found down.  Unresponsive.  On Coumadin. EXAM: CT HEAD WITHOUT CONTRAST TECHNIQUE: Contiguous axial images were obtained from the base of the skull through the vertex without intravenous contrast. COMPARISON:  05/25/2015 FINDINGS: Brain: No evidence of acute infarction, hemorrhage, hydrocephalus, extra-axial collection or mass lesion/mass effect. Stable mild to moderate cerebral and cerebellar atrophy. No significant change in appearance of chronic small vessel disease, and multiple old lacunar infarcts involving the basal ganglia and thalami bilaterally. Vascular: No hyperdense vessel or unexpected calcification. Skull: Normal. Negative for fracture or focal lesion. Sinuses/Orbits: No acute finding. Other: None. IMPRESSION: No acute intracranial abnormality. Stable cerebral and cerebellar atrophy, chronic small vessel disease, and old lacunar infarcts. Electronically Signed   By: Earle Gell M.D.   On: 11/26/2016 21:36   Dg Chest Port 1  View  Result Date: 11/26/2016 CLINICAL DATA:  Initial evaluation for hypoglycemia. EXAM: PORTABLE CHEST 1 VIEW COMPARISON:  Prior radiograph from 10/19/2012. FINDINGS: Cardiac and mediastinal silhouettes are stable in size and contour, and remain within normal limits. Lungs are mildly hypoinflated. No focal infiltrates. No pulmonary edema or pleural effusion. No pneumothorax. No acute osseus abnormality. Left shoulder arthroplasty noted. Osteopenia. IMPRESSION: No active cardiopulmonary disease. Electronically Signed   By: Jeannine Boga M.D.   On: 11/26/2016 20:59        Scheduled Meds: . aspirin  325 mg Oral Q breakfast  . folic acid  1 mg Oral Daily  . gabapentin  300 mg Oral TID  . insulin aspart  0-15 Units Subcutaneous TID WC  . insulin aspart  2 Units Subcutaneous TID WC  . insulin glargine  7 Units Subcutaneous Daily  . losartan  100 mg Oral QPM  . multivitamin with minerals  1 tablet Oral QPM  . polyethylene glycol  17 g Oral QODAY  . simvastatin  20 mg Oral QPM  . sodium chloride flush  3 mL Intravenous Q12H  . venlafaxine  75 mg Oral BID  . warfarin  10 mg Oral q1800  . Warfarin - Physician Dosing Inpatient   Does not apply q1800   Continuous Infusions: . sodium chloride 75 mL/hr at 11/27/16 1003     LOS: 2 days    Time spent: 35 mins     Macenzie Burford Arsenio Loader, MD [facility] Pager [phone]   If 7PM-7AM, please contact night-coverage [email or web address] Password [email or web address] 11/28/2016, 1:41 PM

## 2016-11-28 NOTE — Progress Notes (Signed)
Inpatient Diabetes Program Recommendations  AACE/ADA: New Consensus Statement on Inpatient Glycemic Control (2015)  Target Ranges:  Prepandial:   less than 140 mg/dL      Peak postprandial:   less than 180 mg/dL (1-2 hours)      Critically ill patients:  140 - 180 mg/dL   Results for Erin Daniels, Erin Daniels (MRN [id number]) as of 11/28/2016 14:35  Ref. Range 11/27/2016 06:09 11/27/2016 07:57 11/27/2016 08:58 11/27/2016 10:13 11/27/2016 10:57 11/27/2016 12:19 11/27/2016 13:48 11/27/2016 17:09 11/27/2016 21:25 11/28/2016 07:44 11/28/2016 11:11  Glucose-Capillary Latest Ref Range: 65 - 99 mg/dL 257 (H) 340 (H) 416 (H) 442 (H) 508 (HH) 374 (H) 361 (H) 322 (H) 185 (H) 100 (H) 308 (H)   Review of Glycemic Control Diabetes history: DM 1 (per Endocrinology  [facility] everywhere) Outpatient Diabetes medications: Insulin Pump Novolog (1:25 at the most, 1 units drops her 80, total daily basal 7.25 units) Current orders for Inpatient glycemic control: Novolog 0-15 units TID with meals, Novolog 2 units TID with meals for meal coverage, Lantus 7 units daily  Inpatient Diabetes Program Recommendations: Insulin - Meal Coverage: Please consider increasing meal coverage to Novolog 4 units TID with meals if patient eats at least 50% of meals. Correction (SSI): Please consider decreasing Novolog correction to sensitive scale and adding Novolog 0-5 units QHS.  Thanks,  Barnie Alderman, RN, MSN, CDE Diabetes Coordinator Inpatient Diabetes Program [phone] (Team Pager from 8am to 5pm)

## 2016-11-29 LAB — GLUCOSE, CAPILLARY
GLUCOSE-CAPILLARY: 240 mg/dL — AB (ref 65–99)
GLUCOSE-CAPILLARY: 167 mg/dL — AB (ref 65–99)
GLUCOSE-CAPILLARY: 182 mg/dL — AB (ref 65–99)
Glucose-Capillary: 33 mg/dL — CL (ref 65–99)
Glucose-Capillary: 265 mg/dL — ABNORMAL HIGH (ref 65–99)
Glucose-Capillary: 324 mg/dL — ABNORMAL HIGH (ref 65–99)

## 2016-11-29 LAB — PROTIME-INR
INR: 2.12
Prothrombin Time: 24.1 seconds — ABNORMAL HIGH (ref 11.4–15.2)

## 2016-11-29 MED ORDER — INSULIN PUMP
Freq: Three times a day (TID) | SUBCUTANEOUS | Status: DC
  Administered 2016-11-30: 6.6 via SUBCUTANEOUS
  Filled 2016-11-29: qty 1

## 2016-11-29 MED ORDER — ASPIRIN EC 81 MG PO TBEC
81.0000 mg | DELAYED_RELEASE_TABLET | Freq: Every day | ORAL | Status: DC
  Administered 2016-11-30: 81 mg via ORAL
  Filled 2016-11-29: qty 1

## 2016-11-29 MED ORDER — INSULIN ASPART 100 UNIT/ML ~~LOC~~ SOLN
1.0000 [IU] | Freq: Three times a day (TID) | SUBCUTANEOUS | Status: AC
  Administered 2016-11-29 (×2): 1 [IU] via SUBCUTANEOUS

## 2016-11-29 MED ORDER — INSULIN ASPART 100 UNIT/ML ~~LOC~~ SOLN
0.0000 [IU] | Freq: Three times a day (TID) | SUBCUTANEOUS | Status: DC
  Administered 2016-11-29: 2 [IU] via SUBCUTANEOUS
  Administered 2016-11-29: 7 [IU] via SUBCUTANEOUS

## 2016-11-29 MED ORDER — WARFARIN SODIUM 7.5 MG PO TABS
7.5000 mg | ORAL_TABLET | Freq: Every day | ORAL | Status: DC
Start: 2016-11-29 — End: 2016-11-30
  Administered 2016-11-29: 7.5 mg via ORAL
  Filled 2016-11-29: qty 1

## 2016-11-29 MED ORDER — INSULIN GLARGINE 100 UNIT/ML ~~LOC~~ SOLN: 10.0000 [IU] | Freq: Every day | SUBCUTANEOUS | Status: DC

## 2016-11-29 NOTE — Progress Notes (Signed)
[place] TEAM 1 - Stepdown/ICU TEAM  Erin Daniels  IWP:[id number] DOB: [birth year] DOA: 11/26/2016 PCP: Erin Pitch, MD    Brief Narrative:  71 y.o. female with history of CVA on coumadin, osteoporosis, hypothyroidism, HTN, gastroparesis, and DM2 with neuropathy and retinopathy who was found down.  The patient was awake to voice, but quickly fell back asleep.  Erin Daniels ran out of her insulin pump sites and borrowed one from a friend until she could get a refill.  She looked up on the internet how to use an alternate site for the pump, as this brand was not the correct brand for her pump.  Her husband thinks she must have misunderstood the directions because she primed the line prior to hooking up the pump, and then the machine prompted her to prime it again. About 1.5 hours later he noticed her on the floor of the kitchen.  When EMS arrived, her CBG was 10.    Subjective: The patient has no complaints this morning.  She denies fevers chills shortness of breath chest pain nausea or vomiting.  She tells me she has not yet acquired new infusion sets for her insulin pump but feels that she can obtain at least 1 by the end of the day today/first thing in the morning.  Assessment & Plan:  Type 1 diabetes mellitus with hypoglycemia - Appears to be an incidental OD of insulin given she was using a non compatible infusion set - educate on pump trouble shooting - infusion set use - CBG still very labile, w/ values over last 24hrs from 29 > 308 - do not feel d/c home is safe at this time - patient has also already been dosed with Lantus today - my plan is for the patient to bring her insulin pump and a new infusion set into the hospital this evening/no later than tomorrow morning - she will not be given Lantus in the morning but instead will resume use of her insulin pump at that time with discontinuation of sliding-scale/meal coverage insulin - if her CBG remains relatively stable through the  course of the morning we will plan to discharge her midday or later 4/12  Lactic acidosis, leukocytosis, concern for sepsis - due to severe hypoglycemia, obtundation, lying lifeless on floor for ~1hr  Hypokalemia - due to insulin overdose - resolved     Essential hypertension - BP remains above goal - suspect some element of her hospitalization - follow w/o change today - will need f/u as outpt   Hypothyroidism - resume home synthroid   H/O stroke on coumadin - cont coumadin per pharmacy   DVT prophylaxis: warfarin  Code Status: FULL CODE Family Communication: no family present at time of exam  Disposition Plan: resume insulin pulmp 4/12 AM w/ possible d/c home later in day if CBG stable   Consultants:  none  Procedures: none  Antimicrobials:  None presently    Objective: Blood pressure (!) 132/52, pulse 88, temperature 98.7 F (37.1 C), temperature source Oral, resp. rate 13, height 5\' 5"  (1.651 m), weight 50.7 kg (111 lb 11.2 oz), SpO2 97 %.  Intake/Output Summary (Last 24 hours) at 11/29/16 0952 Last data filed at 11/29/16 0900  Gross per 24 hour  Intake             3359 ml  Output             3300 ml  Net  59 ml   Filed Weights   11/26/16 1949 11/28/16 0500 11/29/16 0322  Weight: 54.4 kg (120 lb) 53.8 kg (118 lb 9.6 oz) 50.7 kg (111 lb 11.2 oz)    Examination: General: No acute respiratory distress - alert and oriented  Lungs: CTA B w/o wheezing  Cardiovascular: RRR w/o M Abdomen: Nontender, nondistended, soft, bowel sounds positive Extremities: No edema bilateral lower extremities  CBC:  Recent Labs Lab 11/26/16 2027 11/26/16 2336 11/27/16 0425 11/28/16 0905  WBC 16.8* 7.4 8.7 10.0  NEUTROABS 14.5*  --   --   --   HGB 11.4* 9.9* 10.4* 10.5*  HCT 34.5* 29.1* 31.4* 31.7*  MCV 93.8 91.8 91.3 92.4  PLT 221 174 204 761   Basic Metabolic Panel:  Recent Labs Lab 11/26/16 2027 11/26/16 2336 11/27/16 0425 11/28/16 0905  NA 138   --  136 136  K 2.8*  --  3.7 4.4  CL 103  --  106 103  CO2 24  --  23 25  GLUCOSE 82  --  83 289*  BUN 21*  --  11 7  CREATININE 0.55 0.42* 0.46 0.56  CALCIUM 8.7*  --  8.4* 8.5*   GFR: Estimated Creatinine Clearance (by C-G formula based on SCr of 0.56 mg/dL) Female: 50.9 mL/min Female: 59.9 mL/min  Liver Function Tests:  Recent Labs Lab 11/26/16 2027 11/28/16 0905  AST 31 44*  ALT 17 22  ALKPHOS 61 55  BILITOT 0.3 0.6  PROT 7.0 6.1*  ALBUMIN 3.6 3.4*    Recent Labs Lab 11/26/16 2027  LIPASE 15   Coagulation Profile:  Recent Labs Lab 11/26/16 2027 11/27/16 0425 11/28/16 0905 11/29/16 0337  INR 1.45 1.62 2.34 2.12    Cardiac Enzymes:  Recent Labs Lab 11/26/16 2027  CKTOTAL 100    HbA1C: Hgb A1c MFr Bld  Date/Time Value Ref Range Status  05/27/2015 04:12 PM 6.2 (H) 4.8 - 5.6 % Final    Comment:    (NOTE)         Pre-diabetes: 5.7 - 6.4         Diabetes: >6.4         Glycemic control for adults with diabetes: <7.0     CBG:  Recent Labs Lab 11/28/16 2110 11/28/16 2112 11/28/16 2150 11/29/16 0129 11/29/16 0726  GLUCAP 33* 29* 83 240* 265*    Recent Results (from the past 240 hour(s))  Blood Culture (routine x 2)     Status: None (Preliminary result)   Collection Time: 11/26/16  8:11 PM  Result Value Ref Range Status   Specimen Description BLOOD LEFT ANTECUBITAL  Final   Special Requests IN PEDIATRIC BOTTLE Blood Culture adequate volume  Final   Culture NO GROWTH 2 DAYS  Final   Report Status PENDING  Incomplete  Blood Culture (routine x 2)     Status: None (Preliminary result)   Collection Time: 11/26/16  8:39 PM  Result Value Ref Range Status   Specimen Description BLOOD RIGHT ARM  Final   Special Requests   Final    AEROBIC BOTTLE ONLY Blood Culture results may not be optimal due to an inadequate volume of blood received in culture bottles   Culture NO GROWTH 2 DAYS  Final   Report Status PENDING  Incomplete  MRSA PCR Screening      Status: None   Collection Time: 11/27/16  5:07 PM  Result Value Ref Range Status   MRSA by PCR NEGATIVE NEGATIVE Final  Comment:        The GeneXpert MRSA Assay (FDA approved for NASAL specimens only), is one component of a comprehensive MRSA colonization surveillance program. It is not intended to diagnose MRSA infection nor to guide or monitor treatment for MRSA infections.      Scheduled Meds: . aspirin  325 mg Oral Q breakfast  . folic acid  1 mg Oral Daily  . gabapentin  300 mg Oral TID  . insulin aspart  0-9 Units Subcutaneous TID WC  . insulin aspart  1 Units Subcutaneous TID WC  . [START ON 11/30/2016] insulin glargine  10 Units Subcutaneous Daily  . losartan  100 mg Oral QPM  . multivitamin with minerals  1 tablet Oral QPM  . polyethylene glycol  17 g Oral QODAY  . simvastatin  20 mg Oral QPM  . sodium chloride flush  3 mL Intravenous Q12H  . venlafaxine  75 mg Oral BID  . warfarin  7.5 mg Oral q1800  . Warfarin - Pharmacist Dosing Inpatient   Does not apply q1800   Continuous Infusions: . sodium chloride 75 mL/hr at 11/28/16 2200     LOS: 3 days   Cherene Altes, MD [facility] Office  [phone] Pager - Text Page per Amion as per below:  On-Call/Text Page:      [email or web address]      password [email or web address]  If 7PM-7AM, please contact night-coverage [email or web address] Password [email or web address] 11/29/2016, 9:52 AM

## 2016-11-29 NOTE — Progress Notes (Signed)
Hypoglycemic Event  CBG: 33 at 2110, rechecked at 2112 and was 29  Treatment: 15 GM carbohydrate snack  Symptoms: None  Follow-up CBG: Time:2150 CBG Result:83  Possible Reasons for Event: Unknown  Comments/MD notified: patient was alert and oriented x4, answering RN's questions appropriately during event    [name], [place] A

## 2016-11-29 NOTE — Plan of Care (Signed)
Problem: Education: Goal: Knowledge of [facility] Education information/materials will improve Outcome: Progressing Patient aware of plan of care.  RN administered gabapentin earlier this shift.  Patient able to explain to nurse why she takes gabapentin.

## 2016-11-29 NOTE — Progress Notes (Signed)
ANTICOAGULATION CONSULT NOTE - Initial Consult  Pharmacy Consult for coumadin Indication: hx of stroke  Allergies  Allergen Reactions  . Codeine Nausea Only    Can take Vicodin and Percocet   . Meperidine Nausea Only  . Metoclopramide Hcl Nausea Only  . Oxycodone Nausea Only  . Stadol  [Butorphanol] Nausea Only  . Tussionex Pennkinetic Er [Hydrocod Polst-Cpm Polst Er] Other (See Comments)    Filled patient with sugar   . Carbocaine  [Mepivacaine Hcl] Palpitations    Patient Measurements: Height: 5\' 5"  (165.1 cm) Weight: 111 lb 11.2 oz (50.7 kg) IBW/kg (Calculated) : 57  Vital Signs: Temp: 98.7 F (37.1 C) (04/11 0727) Temp Source: Oral (04/11 0727) BP: 132/52 (04/11 0904) Pulse Rate: 88 (04/11 0904)  Labs:  Recent Labs  11/26/16 2027 11/26/16 2336 11/27/16 0425 11/28/16 0905 11/29/16 0337  HGB 11.4* 9.9* 10.4* 10.5*  --   HCT 34.5* 29.1* 31.4* 31.7*  --   PLT 221 174 204 190  --   APTT 29  --   --   --   --   LABPROT 17.8*  --  19.5* 26.1* 24.1*  INR 1.45  --  1.62 2.34 2.12  CREATININE 0.55 0.42* 0.46 0.56  --   CKTOTAL 100  --   --   --   --     Estimated Creatinine Clearance (by C-G formula based on SCr of 0.56 mg/dL) Female: 50.9 mL/min Female: 59.9 mL/min    Assessment: 70 YOF on coumadin PTA for hx of stroke, INR 2.12, therapeutic. No bleeding noted per chart.  PTA dose: 7.5 mg daily  Goal of Therapy:  INR 2-3 Monitor platelets by anticoagulation protocol: Yes   Plan:  Coumadin 7.5mg  po daily per home dose Daily PT/INR  Maryanna Shape, PharmD, BCPS  Clinical Pharmacist  Pager: [phone]   11/29/2016,9:05 AM

## 2016-11-30 DIAGNOSIS — I639 Cerebral infarction, unspecified: Secondary | ICD-10-CM

## 2016-11-30 LAB — BASIC METABOLIC PANEL
ANION GAP: 7 (ref 5–15)
BUN: 9 mg/dL (ref 6–20)
CALCIUM: 8.9 mg/dL (ref 8.9–10.3)
CO2: 30 mmol/L (ref 22–32)
Chloride: 100 mmol/L — ABNORMAL LOW (ref 101–111)
Creatinine, Ser: 0.56 mg/dL (ref 0.44–1.00)
Glucose, Bld: 286 mg/dL — ABNORMAL HIGH (ref 65–99)
Potassium: 4 mmol/L (ref 3.5–5.1)
Sodium: 137 mmol/L (ref 135–145)

## 2016-11-30 LAB — PROTIME-INR
INR: 1.94
Prothrombin Time: 22.4 seconds — ABNORMAL HIGH (ref 11.4–15.2)

## 2016-11-30 LAB — GLUCOSE, CAPILLARY
GLUCOSE-CAPILLARY: 290 mg/dL — AB (ref 65–99)
GLUCOSE-CAPILLARY: 399 mg/dL — AB (ref 65–99)
GLUCOSE-CAPILLARY: 388 mg/dL — AB (ref 65–99)

## 2016-11-30 MED ORDER — INSULIN ASPART 100 UNIT/ML ~~LOC~~ SOLN
1000.0000 [IU] | Freq: Once | SUBCUTANEOUS | Status: AC
  Administered 2016-11-30: 100 [IU] via SUBCUTANEOUS
  Filled 2016-11-30 (×2): qty 10

## 2016-11-30 MED ORDER — WARFARIN SODIUM 7.5 MG PO TABS: 7.5000 mg | ORAL_TABLET | Freq: Once | ORAL | Status: DC

## 2016-11-30 NOTE — Discharge Summary (Signed)
Physician Discharge Summary  Erin Daniels:[id number] DOB: [birth year] DOA: 11/26/2016  PCP: Juluis Pitch, MD  Admit date: 11/26/2016 Discharge date: 11/30/2016  Admitted From: Home Disposition:  Home  Recommendations for Outpatient Follow-up:  1. Follow up with PCP in 1-2 weeks 2. Follow up with endocrinologist, Dr Frederik Pear tomorrow or 12/04/2016. 3. Keep your blood sugar in the range of 150-250 until seen by endocrinologist. Do not tightly control your blood sugar due to given risk of hypoglycemia possibly leading to death.  Home Health: No Equipment/Devices: No  Discharge Condition:* Stable CODE STATUS:* Full Diet recommendation: Heart Healthy / Carb Modified  Brief/Interim Summary: 71 year old female with past medical history of stroke on Coumadin, osteoporosis, hypothyroidism, hypertension, gastroparesis, diabetes type 2 was found unresponsive. Her blood sugars were noted to be 10 at that time. It turns out patient had run out of her insulin pump sites therefore board her friends and did not properly follow instructions and ended up overdosing herself. After admission to the hospital she was started on Lantus and sliding scale. Diabetic coronary was consulted for the assistance and she did well on it. Her mental status significantly improved and returned to the baseline. Upon her mental status returned to baseline patient insisted she wants to go back on insulin pump and not take basal bolus insulin. Finally her insulin pump that had been ordered last week arrived and her husband had brought her to the hospital. Diabetic coordinator assessed her capability of handling her pump independently but it was concerning given she was not following right instructions and was counting carbs and insulin units from her experience rather than actual meal she was taking in. Her blood glucoses remain hyperglycemic in 300 to around 400 but patient refused to make any changes. She was instructed by me  and the diabetic coordinator several times onto the proper education but she refused to do so. She continued to state that she has been doing this for over 18 years and likes to do the way she wants to do it. She will not try any other form of insulin such as basal/bolus with Lantus and NovoLog. During this time and mental status remained stable and did comprehend everything really well. She was tolerating diet well. She understood the risks and benefits of uncontrolled diabetes including risks of hyperglycemia and hypoglycemia which could lead to death. She understood well. Her husband was present at bedside. Spoke with Dr. Frederik Pear from endocrinology regarding this was going to try and make an appointment for her to see the patient tomorrow instead of her routine appointment which is on 12/04/2016. I have explained to the patient that we can still watch her in the hospital for another night and give her basal bolus and teacher on how to administer it but she refuses and wants to go home today on insulin pump. I will discharge her today upon her request as at this time she is medically stable with concerns of potential future risk. Follow-up as stated above with your endocrinologist, Dr. Frederik Pear. No other medication changes made.  Further details please refer to the chart.  Discharge Diagnoses:  Active Problems:   Type 1 diabetes mellitus with complication (HCC)   Essential hypertension   Hyperlipidemia   Hypoglycemia  Diabetes mellitus type 1 with hypoglycemia, improved -Secondary to issues with noncompatible device which patient wasn't used to -Patient is insisting on leaving today on her routine insulin pump which has been delivered to the hospital. We do have some concerns on about  how she is using it as she is not following appropriate instructions. She has been administering insulin NovoLog from her experience rather than actually counting carbs and calculating. She refuses to use automated  calculating function on her insulin pump and wants to do with the way she likes. She understands the risk for doing so which can lead to hypoglycemia and possible death. Also not controlling it well and cause hyperglycemia leading to diabetic ketoacidosis. Husband is present at bedside during this conversation and he agrees with the risk as well and wants to take the patient home. -Spoke with her outpatient endocrinologist, Dr. Frederik Pear regarding this case and she will try and arrange to see the patient tomorrow otherwise follow-up on 12/04/2016 as an outpatient  Hypertension -Continue home medications. Losartan 100 mg  Hypothyroidism TSH within normal limits Continue Synthroid  History of CVA -Continue Coumadin per home dosing -Continue Zocor    DVT prophylaxis: Coumadin Code Status: Full code Family Communication:  Husband at bedside.  Disposition Plan: Discharge home.    Discharge Instructions  Discharge Instructions    Ambulatory referral to Nutrition and Diabetic Education    Complete by:  As directed    Patient needs insulin pump education ASAP. Patient has an [employer] insulin pump and does not know how to properly use the correction and meal coverage functions. Patient administers insulin with manual boluses. Patient admitted with severe hypoglycemia.     Allergies as of 11/30/2016      Reactions   Codeine Nausea Only   Can take Vicodin and Percocet    Meperidine Nausea Only   Metoclopramide Hcl Nausea Only   Oxycodone Nausea Only   Stadol  [butorphanol] Nausea Only   Tussionex Pennkinetic Er [hydrocod Polst-cpm Polst Er] Other (See Comments)   Filled patient with sugar    Carbocaine  [mepivacaine Hcl] Palpitations      Medication List    TAKE these medications   aspirin 325 MG EC tablet Take 325 mg by mouth daily with breakfast.   butalbital-acetaminophen-caffeine 50-325-40 MG tablet Commonly known as:  FIORICET, ESGIC Take 0.5-1 tablets by mouth 3 (three)  times daily as needed for headache.   CALCIUM 600 + D PO Take 1 tablet by mouth every evening.   carboxymethylcellulose 0.5 % Soln Commonly known as:  REFRESH PLUS Take 1-2 drops by mouth daily after breakfast.   doxycycline 40 MG capsule Commonly known as:  ORACEA Take 40 mg by mouth every evening.   folic acid 1 MG tablet Commonly known as:  FOLVITE Take 1 mg by mouth daily.   gabapentin 300 MG capsule Commonly known as:  NEURONTIN Take 300 mg by mouth 3 (three) times daily. Takes in the morning, at dinner, and at bedtime   insulin pump Soln Inject 0.4-4 each into the skin continuous. Uses Novolog. ( pt states she takes 20 units cont through out day) pump is not on currently   IRON (FERROUS GLUCONATE) PO Take 1 tablet by mouth every evening.   losartan 100 MG tablet Commonly known as:  COZAAR Take 100 mg by mouth every evening.   multivitamin with minerals Tabs tablet Take 1 tablet by mouth every evening.   oxyCODONE 5 MG immediate release tablet Commonly known as:  ROXICODONE Take 1 tablet (5 mg total) by mouth every 6 (six) hours as needed for severe pain.   polyethylene glycol packet Commonly known as:  MIRALAX / GLYCOLAX Take 17 g by mouth every other day.   promethazine 25 MG  suppository Commonly known as:  PHENERGAN Place 25 mg rectally every 6 (six) hours as needed for nausea or vomiting.   simvastatin 20 MG tablet Commonly known as:  ZOCOR Take 20 mg by mouth every evening.   traMADol 50 MG tablet Commonly known as:  ULTRAM Take 50 mg by mouth every 6 (six) hours as needed.   venlafaxine 75 MG tablet Commonly known as:  EFFEXOR Take 75 mg by mouth 2 (two) times daily.   VITAMIN C PO Take 1 tablet by mouth daily with breakfast.   VITAMIN E PO Take 1 tablet by mouth every evening.   warfarin 5 MG tablet Commonly known as:  COUMADIN Take 7.5 mg by mouth daily at 6 PM.       Allergies  Allergen Reactions  . Codeine Nausea Only    Can  take Vicodin and Percocet   . Meperidine Nausea Only  . Metoclopramide Hcl Nausea Only  . Oxycodone Nausea Only  . Stadol  [Butorphanol] Nausea Only  . Tussionex Pennkinetic Er [Hydrocod Polst-Cpm Polst Er] Other (See Comments)    Filled patient with sugar   . Carbocaine  [Mepivacaine Hcl] Palpitations    Consultations:  Diabetic Coordinator.    Procedures/Studies: Ct Head Wo Contrast  Result Date: 11/26/2016 CLINICAL DATA:  Found down.  Unresponsive.  On Coumadin. EXAM: CT HEAD WITHOUT CONTRAST TECHNIQUE: Contiguous axial images were obtained from the base of the skull through the vertex without intravenous contrast. COMPARISON:  05/25/2015 FINDINGS: Brain: No evidence of acute infarction, hemorrhage, hydrocephalus, extra-axial collection or mass lesion/mass effect. Stable mild to moderate cerebral and cerebellar atrophy. No significant change in appearance of chronic small vessel disease, and multiple old lacunar infarcts involving the basal ganglia and thalami bilaterally. Vascular: No hyperdense vessel or unexpected calcification. Skull: Normal. Negative for fracture or focal lesion. Sinuses/Orbits: No acute finding. Other: None. IMPRESSION: No acute intracranial abnormality. Stable cerebral and cerebellar atrophy, chronic small vessel disease, and old lacunar infarcts. Electronically Signed   By: Earle Gell M.D.   On: 11/26/2016 21:36   Dg Chest Port 1 View  Result Date: 11/26/2016 CLINICAL DATA:  Initial evaluation for hypoglycemia. EXAM: PORTABLE CHEST 1 VIEW COMPARISON:  Prior radiograph from 10/19/2012. FINDINGS: Cardiac and mediastinal silhouettes are stable in size and contour, and remain within normal limits. Lungs are mildly hypoinflated. No focal infiltrates. No pulmonary edema or pleural effusion. No pneumothorax. No acute osseus abnormality. Left shoulder arthroplasty noted. Osteopenia. IMPRESSION: No active cardiopulmonary disease. Electronically Signed   By: Jeannine Boga M.D.   On: 11/26/2016 20:59       Subjective:   Discharge Exam: Vitals:   11/30/16 0739 11/30/16 1153  BP: (!) 177/78 (!) 145/67  Pulse: (!) 102 91  Resp: 18   Temp: 99.9 F (37.7 C) 98.4 F (36.9 C)   Vitals:   11/29/16 2330 11/30/16 0410 11/30/16 0739 11/30/16 1153  BP: (!) 156/67 (!) 164/77 (!) 177/78 (!) 145/67  Pulse: 90 (!) 103 (!) 102 91  Resp: (!) 25 14 18    Temp: 99.3 F (37.4 C) 99.3 F (37.4 C) 99.9 F (37.7 C) 98.4 F (36.9 C)  TempSrc:   Oral Oral  SpO2: 96% 98% 97%   Weight:  49.4 kg (108 lb 12.8 oz)    Height:        General: Pt is alert, awake, not in acute distress Cardiovascular: RRR, S1/S2 +, no rubs, no gallops Respiratory: CTA bilaterally, no wheezing, no rhonchi Abdominal: Soft, NT,  ND, bowel sounds + Extremities: no edema, no cyanosis    The results of significant diagnostics from this hospitalization (including imaging, microbiology, ancillary and laboratory) are listed below for reference.     Microbiology: Recent Results (from the past 240 hour(s))  Blood Culture (routine x 2)     Status: None (Preliminary result)   Collection Time: 11/26/16  8:11 PM  Result Value Ref Range Status   Specimen Description BLOOD LEFT ANTECUBITAL  Final   Special Requests IN PEDIATRIC BOTTLE Blood Culture adequate volume  Final   Culture NO GROWTH 4 DAYS  Final   Report Status PENDING  Incomplete  Blood Culture (routine x 2)     Status: None (Preliminary result)   Collection Time: 11/26/16  8:39 PM  Result Value Ref Range Status   Specimen Description BLOOD RIGHT ARM  Final   Special Requests   Final    AEROBIC BOTTLE ONLY Blood Culture results may not be optimal due to an inadequate volume of blood received in culture bottles   Culture NO GROWTH 4 DAYS  Final   Report Status PENDING  Incomplete  MRSA PCR Screening     Status: None   Collection Time: 11/27/16  5:07 PM  Result Value Ref Range Status   MRSA by PCR NEGATIVE NEGATIVE Final     Comment:        The GeneXpert MRSA Assay (FDA approved for NASAL specimens only), is one component of a comprehensive MRSA colonization surveillance program. It is not intended to diagnose MRSA infection nor to guide or monitor treatment for MRSA infections.      Labs: BNP (last 3 results)  Recent Labs  11/26/16 2034  BNP 62.5   Basic Metabolic Panel:  Recent Labs Lab 11/26/16 2027 11/26/16 2336 11/27/16 0425 11/28/16 0905 11/30/16 0421  NA 138  --  136 136 137  K 2.8*  --  3.7 4.4 4.0  CL 103  --  106 103 100*  CO2 24  --  23 25 30   GLUCOSE 82  --  83 289* 286*  BUN 21*  --  11 7 9   CREATININE 0.55 0.42* 0.46 0.56 0.56  CALCIUM 8.7*  --  8.4* 8.5* 8.9   Liver Function Tests:  Recent Labs Lab 11/26/16 2027 11/28/16 0905  AST 31 44*  ALT 17 22  ALKPHOS 61 55  BILITOT 0.3 0.6  PROT 7.0 6.1*  ALBUMIN 3.6 3.4*    Recent Labs Lab 11/26/16 2027  LIPASE 15   No results for input(s): AMMONIA in the last 168 hours. CBC:  Recent Labs Lab 11/26/16 2027 11/26/16 2336 11/27/16 0425 11/28/16 0905  WBC 16.8* 7.4 8.7 10.0  NEUTROABS 14.5*  --   --   --   HGB 11.4* 9.9* 10.4* 10.5*  HCT 34.5* 29.1* 31.4* 31.7*  MCV 93.8 91.8 91.3 92.4  PLT 221 174 204 190   Cardiac Enzymes:  Recent Labs Lab 11/26/16 2027  CKTOTAL 100   BNP: Invalid input(s): POCBNP CBG:  Recent Labs Lab 11/29/16 1619 11/29/16 2057 11/30/16 0739 11/30/16 1015 11/30/16 1144  GLUCAP 167* 182* 290* 399* 388*   D-Dimer No results for input(s): DDIMER in the last 72 hours. Hgb A1c No results for input(s): HGBA1C in the last 72 hours. Lipid Profile No results for input(s): CHOL, HDL, LDLCALC, TRIG, CHOLHDL, LDLDIRECT in the last 72 hours. Thyroid function studies No results for input(s): TSH, T4TOTAL, T3FREE, THYROIDAB in the last 72 hours.  Invalid input(s): FREET3  Anemia work up No results for input(s): VITAMINB12, FOLATE, FERRITIN, TIBC, IRON, RETICCTPCT in the  last 72 hours. Urinalysis    Component Value Date/Time   COLORURINE Yellow 10/19/2012 1628   APPEARANCEUR Clear 10/19/2012 1628   LABSPEC 1.017 10/19/2012 1628   PHURINE 5.0 10/19/2012 1628   GLUCOSEU >=500 10/19/2012 1628   HGBUR Negative 10/19/2012 1628   BILIRUBINUR Negative 10/19/2012 1628   KETONESUR Negative 10/19/2012 1628   PROTEINUR Negative 10/19/2012 1628   NITRITE Negative 10/19/2012 1628   LEUKOCYTESUR Negative 10/19/2012 1628   Sepsis Labs Invalid input(s): PROCALCITONIN,  WBC,  LACTICIDVEN Microbiology Recent Results (from the past 240 hour(s))  Blood Culture (routine x 2)     Status: None (Preliminary result)   Collection Time: 11/26/16  8:11 PM  Result Value Ref Range Status   Specimen Description BLOOD LEFT ANTECUBITAL  Final   Special Requests IN PEDIATRIC BOTTLE Blood Culture adequate volume  Final   Culture NO GROWTH 4 DAYS  Final   Report Status PENDING  Incomplete  Blood Culture (routine x 2)     Status: None (Preliminary result)   Collection Time: 11/26/16  8:39 PM  Result Value Ref Range Status   Specimen Description BLOOD RIGHT ARM  Final   Special Requests   Final    AEROBIC BOTTLE ONLY Blood Culture results may not be optimal due to an inadequate volume of blood received in culture bottles   Culture NO GROWTH 4 DAYS  Final   Report Status PENDING  Incomplete  MRSA PCR Screening     Status: None   Collection Time: 11/27/16  5:07 PM  Result Value Ref Range Status   MRSA by PCR NEGATIVE NEGATIVE Final    Comment:        The GeneXpert MRSA Assay (FDA approved for NASAL specimens only), is one component of a comprehensive MRSA colonization surveillance program. It is not intended to diagnose MRSA infection nor to guide or monitor treatment for MRSA infections.      Time coordinating discharge: Over 30 minutes  SIGNED:   Damita Lack, MD  [facility] 11/30/2016, 3:13 PM Pager   If 7PM-7AM, please contact  night-coverage [email or web address] Password [email or web address]

## 2016-11-30 NOTE — Plan of Care (Signed)
Problem: Activity: Goal: Risk for activity intolerance will decrease Outcome: Progressing Patient to and from bedside commode, no activity intolerance noted.

## 2016-11-30 NOTE — Progress Notes (Addendum)
Noted patient has order to restart insulin pump. Went to speak with the patient and make sure she had everything to restart her insulin pump. Patient was sitting up on the side of the bed and has new infusion site and new insulin cartilage on the bed and is waiting for the nurse to bring a bottle of Novolog to use to fill her insulin cartilage.  Went to nursing station and received new vial of Novolog insulin for patient and took back to patient. Patient given Novolog insulin vial. Patient had written instructions for filling insulin cartilage, inserting new insulin cartilage, inserting new infusion site, and to prime tubing and infusion site. Stayed with patient and watched as she got everything in place for insulin pump restart.  Patient states that she has had an insulin pump for 19 years.  Patient required an excessive amount of assistance in properly inserting new insulin cartilage into the insulin pump, with priming the tubing, and with administering a correction via insulin pump.   Asked patient who helped her with her insulin pump management and she reports that no one helps her at home and that she manages her insulin pump independently.  Glucose was 290 mg/dl this morning at 7:39 and patient ate approximately 65 grams of carbohydrates for breakfast. Had patient check glucose with her glucometer and her glucose was 456 mg/dl and repeat glucose was 445 mg/dl. Asked patient to do a correction for current glucose and patient stated she did not know how to do that because she does not use that function on her insulin pump.  Walked patient through steps to do bolus for current glucose of 445 mg/d and patient administered 4.05 units via insulin pump.  Patient does not seem appropriate to use an insulin pump as she is very uncertain about proper technique/procedure for insulin pump management.  Patient states that she has an appointment with Dr. Frederik Pear (Endocrinologist) this Monday. If patient is going to be  discharged on her insulin pump she needs to see her Endocrinologist or CDE/insulin pump trainer today or tomorrow at the latest. Very uncomfortable with patient using an insulin pump for DM control at this point due to excessive amount of assistance needed and admission for severe hypoglycemia.   Asked patient to recheck her glucose at 10:15 today to make sure her glucose is coming down. Also talked with Cyril Mourning, RN about concerns and to make sure glucose was checked at 10:15 am. Will recheck patient within 1-2 hours.   11/30/16@12 :40-Spoke with patient again at bedside and had her recheck her glucose. Patient's current glucose is 411 mg/dl at 12:15 and patient is getting ready to eat lunch. Asked patient what she would do for meal coverage and glucose correction and patient states she calculates how much insulin she needs for the food and she is uncertain what to do about a correction.Discussed patient's correction factor in her insulin pump which is 1 unit for every 80 mg/dl above her target glucose. Patient calculated in her head and said she was going to manually bolus herself 6.6 units of insulin via insulin pump. Explained to the patient that I was very concerned with her using an insulin pump especially with her not being able to use pump functions (sensitivity and meal coverage) and with her being admitted with severe hypoglycemia. Patient states "I will be just fine. I have done this for a very long time. I check my glucose 9 times per day and I keep my sugar in the 100's mg/dl.  This is what works for me."  Inquired about hypoglycemia occurrence and patient admits that she has frequent hypoglycemia and that she is unable to tell when she is having a low glucose. Discussed danger of hypoglycemia unawareness. Explained to patient that she needs to be educated further about her insulin pump and that she needs to work with an insulin pump trainer. Patient states that she had worked with a pump trainer a couple  of times at Dr. Jimmy Picket office in the past and she is agreeable to getting more education on an insulin pump. [facility] to see if patient could be seen today by insulin pump trainer but there are not any available appointments for today. Patient reports that she lives in [place] and would prefer to go to the [facility] for education if possible.  Again stressed concern about patient using an insulin pump and she reports that she is going to use her insulin pump and she will be fine. Encouraged patient to call Dr. Jimmy Picket office to move appointment to Friday but patient states that her daughter is coming in town and she wants to keep her appointment for Monday with Dr. Frederik Pear. Patient verbalized understanding of information discussed but is adamant that she is going to continue using her insulin pump for DM management. Spoke with Cyril Mourning, RN about current glucose and concern about patient using an insulin pump. Will call Dr. Reesa Chew about conversation and concerns. Will place outpatient referral for outpatient DM education for insulin pump education.  11/30/16@13 :43-Spoke with Loma Sousa, RN (Triage Nurse) at Dr. Jimmy Picket office and discussed concerns about patient using insulin pump and severe hypoglycemia. RN stated that she will send an urgent message to Dr. Frederik Pear to call Dr. Reesa Chew regarding patient.  Thanks, Barnie Alderman, RN, MSN, CDE Diabetes Coordinator [facility]patient [facility] [phone] (Team Pager from 8am to 5pm)

## 2016-11-30 NOTE — Care Management Important Message (Signed)
Important Message  Patient Details  Name: Erin Daniels MRN: [id number] Date of Birth: March 11, 1946   Medicare Important Message Given:  Yes    Nathen May 11/30/2016, 1:12 PM

## 2016-11-30 NOTE — Discharge Instructions (Signed)

## 2016-11-30 NOTE — Progress Notes (Signed)
ANTICOAGULATION CONSULT NOTE - Follow Up Consult  Pharmacy Consult for Coumadin Indication: history of stroke  Allergies  Allergen Reactions  . Codeine Nausea Only    Can take Vicodin and Percocet   . Meperidine Nausea Only  . Metoclopramide Hcl Nausea Only  . Oxycodone Nausea Only  . Stadol  [Butorphanol] Nausea Only  . Tussionex Pennkinetic Er [Hydrocod Polst-Cpm Polst Er] Other (See Comments)    Filled patient with sugar   . Carbocaine  [Mepivacaine Hcl] Palpitations    Patient Measurements: Height: 5\' 5"  (165.1 cm) Weight: 108 lb 12.8 oz (49.4 kg) IBW/kg (Calculated) : 57  Vital Signs: Temp: 98.4 F (36.9 C) (04/12 1153) Temp Source: Oral (04/12 1153) BP: 145/67 (04/12 1153) Pulse Rate: 91 (04/12 1153)  Labs:  Recent Labs  11/28/16 0905 11/29/16 0337 11/30/16 0421  HGB 10.5*  --   --   HCT 31.7*  --   --   PLT 190  --   --   LABPROT 26.1* 24.1* 22.4*  INR 2.34 2.12 1.94  CREATININE 0.56  --  0.56    Estimated Creatinine Clearance (by C-G formula based on SCr of 0.56 mg/dL) Female: 48.8 mL/min Female: 57.5 mL/min   Medications:  Scheduled:  . aspirin  81 mg Oral Q breakfast  . folic acid  1 mg Oral Daily  . gabapentin  300 mg Oral TID  . insulin pump   Subcutaneous TID AC, HS, 0200  . losartan  100 mg Oral QPM  . multivitamin with minerals  1 tablet Oral QPM  . polyethylene glycol  17 g Oral QODAY  . simvastatin  20 mg Oral QPM  . sodium chloride flush  3 mL Intravenous Q12H  . venlafaxine  75 mg Oral BID  . warfarin  7.5 mg Oral q1800  . Warfarin - Pharmacist Dosing Inpatient   Does not apply q1800    Assessment: 71yo female with hx of stroke.  INR is just below goal this AM.  No bleeding noted.  Goal of Therapy:  INR 2-3 Monitor platelets by anticoagulation protocol: Yes   Plan:  Repeat Couamdin 7.5mg  po today Daily INR Watch for s/s of bleeding  Gracy Bruins, PharmD [facility]

## 2016-12-01 LAB — CULTURE, BLOOD (ROUTINE X 2)
Culture: NO GROWTH
Culture: NO GROWTH
Special Requests: ADEQUATE

## 2017-08-17 ENCOUNTER — Other Ambulatory Visit: Payer: Self-pay | Admitting: Obstetrics & Gynecology

## 2017-08-17 DIAGNOSIS — Z1231 Encounter for screening mammogram for malignant neoplasm of breast: Secondary | ICD-10-CM

## 2017-09-13 ENCOUNTER — Ambulatory Visit
Admission: RE | Admit: 2017-09-13 | Discharge: 2017-09-13 | Disposition: A | Discharge status: Home / Self Care | Payer: Medicare Other | Source: Ambulatory Visit | Attending: Obstetrics & Gynecology | Admitting: Obstetrics & Gynecology

## 2017-09-13 DIAGNOSIS — Z1231 Encounter for screening mammogram for malignant neoplasm of breast: Secondary | ICD-10-CM | POA: Insufficient documentation

## 2017-09-17 ENCOUNTER — Inpatient Hospital Stay
Admission: RE | Admit: 2017-09-17 | Discharge: 2017-09-17 | Disposition: A | Discharge status: Home / Self Care | Payer: Self-pay | Source: Ambulatory Visit | Attending: *Deleted | Admitting: *Deleted

## 2017-09-17 ENCOUNTER — Other Ambulatory Visit: Payer: Self-pay | Admitting: *Deleted

## 2017-09-17 DIAGNOSIS — Z9289 Personal history of other medical treatment: Secondary | ICD-10-CM

## 2017-09-28 ENCOUNTER — Telehealth: Payer: Self-pay | Admitting: *Deleted

## 2017-10-04 ENCOUNTER — Encounter: Payer: Self-pay | Admitting: Student in an Organized Health Care Education/Training Program

## 2017-10-04 ENCOUNTER — Other Ambulatory Visit: Payer: Self-pay

## 2017-10-04 ENCOUNTER — Ambulatory Visit
Payer: Medicare Other | Attending: Student in an Organized Health Care Education/Training Program | Admitting: Student in an Organized Health Care Education/Training Program

## 2017-10-04 VITALS — BP 116/55 | HR 75 | Temp 97.6°F | Resp 14 | Ht 62.0 in | Wt 108.0 lb

## 2017-10-04 DIAGNOSIS — Z8673 Personal history of transient ischemic attack (TIA), and cerebral infarction without residual deficits: Secondary | ICD-10-CM | POA: Insufficient documentation

## 2017-10-04 DIAGNOSIS — Z85828 Personal history of other malignant neoplasm of skin: Secondary | ICD-10-CM | POA: Diagnosis not present

## 2017-10-04 DIAGNOSIS — Z79891 Long term (current) use of opiate analgesic: Secondary | ICD-10-CM | POA: Insufficient documentation

## 2017-10-04 DIAGNOSIS — E1022 Type 1 diabetes mellitus with diabetic chronic kidney disease: Secondary | ICD-10-CM | POA: Insufficient documentation

## 2017-10-04 DIAGNOSIS — Z9641 Presence of insulin pump (external) (internal): Secondary | ICD-10-CM | POA: Insufficient documentation

## 2017-10-04 DIAGNOSIS — E108 Type 1 diabetes mellitus with unspecified complications: Secondary | ICD-10-CM | POA: Diagnosis not present

## 2017-10-04 DIAGNOSIS — Z79899 Other long term (current) drug therapy: Secondary | ICD-10-CM | POA: Diagnosis not present

## 2017-10-04 DIAGNOSIS — R202 Paresthesia of skin: Secondary | ICD-10-CM

## 2017-10-04 DIAGNOSIS — Z888 Allergy status to other drugs, medicaments and biological substances status: Secondary | ICD-10-CM | POA: Diagnosis not present

## 2017-10-04 DIAGNOSIS — M7918 Myalgia, other site: Secondary | ICD-10-CM | POA: Insufficient documentation

## 2017-10-04 DIAGNOSIS — S52501A Unspecified fracture of the lower end of right radius, initial encounter for closed fracture: Secondary | ICD-10-CM | POA: Diagnosis not present

## 2017-10-04 DIAGNOSIS — R200 Anesthesia of skin: Secondary | ICD-10-CM | POA: Diagnosis not present

## 2017-10-04 DIAGNOSIS — E1042 Type 1 diabetes mellitus with diabetic polyneuropathy: Secondary | ICD-10-CM | POA: Insufficient documentation

## 2017-10-04 DIAGNOSIS — I129 Hypertensive chronic kidney disease with stage 1 through stage 4 chronic kidney disease, or unspecified chronic kidney disease: Secondary | ICD-10-CM | POA: Insufficient documentation

## 2017-10-04 DIAGNOSIS — Z7901 Long term (current) use of anticoagulants: Secondary | ICD-10-CM | POA: Insufficient documentation

## 2017-10-04 DIAGNOSIS — E876 Hypokalemia: Secondary | ICD-10-CM | POA: Diagnosis not present

## 2017-10-04 DIAGNOSIS — M069 Rheumatoid arthritis, unspecified: Secondary | ICD-10-CM | POA: Insufficient documentation

## 2017-10-04 DIAGNOSIS — F329 Major depressive disorder, single episode, unspecified: Secondary | ICD-10-CM | POA: Diagnosis not present

## 2017-10-04 DIAGNOSIS — Z794 Long term (current) use of insulin: Secondary | ICD-10-CM | POA: Insufficient documentation

## 2017-10-04 DIAGNOSIS — Z9889 Other specified postprocedural states: Secondary | ICD-10-CM | POA: Insufficient documentation

## 2017-10-04 DIAGNOSIS — Z7982 Long term (current) use of aspirin: Secondary | ICD-10-CM | POA: Insufficient documentation

## 2017-10-04 DIAGNOSIS — E871 Hypo-osmolality and hyponatremia: Secondary | ICD-10-CM | POA: Insufficient documentation

## 2017-10-04 DIAGNOSIS — E10649 Type 1 diabetes mellitus with hypoglycemia without coma: Secondary | ICD-10-CM | POA: Diagnosis not present

## 2017-10-04 DIAGNOSIS — E039 Hypothyroidism, unspecified: Secondary | ICD-10-CM | POA: Insufficient documentation

## 2017-10-04 DIAGNOSIS — R42 Dizziness and giddiness: Secondary | ICD-10-CM | POA: Diagnosis not present

## 2017-10-04 DIAGNOSIS — M542 Cervicalgia: Secondary | ICD-10-CM | POA: Diagnosis not present

## 2017-10-04 DIAGNOSIS — E1051 Type 1 diabetes mellitus with diabetic peripheral angiopathy without gangrene: Secondary | ICD-10-CM | POA: Diagnosis not present

## 2017-10-04 DIAGNOSIS — Z885 Allergy status to narcotic agent status: Secondary | ICD-10-CM | POA: Insufficient documentation

## 2017-10-04 DIAGNOSIS — E7800 Pure hypercholesterolemia, unspecified: Secondary | ICD-10-CM | POA: Insufficient documentation

## 2017-10-04 DIAGNOSIS — G894 Chronic pain syndrome: Secondary | ICD-10-CM | POA: Diagnosis not present

## 2017-10-04 MED ORDER — PREGABALIN 50 MG PO CAPS: 100.0000 mg | ORAL_CAPSULE | Freq: Three times a day (TID) | ORAL | 1 refills | Status: DC

## 2017-10-04 NOTE — Progress Notes (Signed)
Patient's Name: Erin Daniels  MRN: [id number]  Referring Provider: Jannifer Franklin, NP  DOB: 10/12/1945  PCP: Juluis Pitch, MD  DOS: 10/04/2017  Note by: Gillis Santa, MD  Service setting: Ambulatory outpatient  Specialty: Interventional Pain Management  Location: [facility] (AMB) Pain Management Facility  Visit type: Initial Patient Evaluation  Patient type: New Patient   Primary Reason(s) for Visit: Encounter for initial evaluation of one or more chronic problems (new to examiner) potentially causing chronic pain, and posing a threat to normal musculoskeletal function. (Level of risk: High) CC: Foot Pain (bilateral); Hand Pain (bilateral); and Neck Pain  HPI  Erin Daniels is a 72 y.o. year old, female patient, who comes today to see [place] for the first time for an initial evaluation of her chronic pain. She has Right radial fracture; Type 1 diabetes mellitus with complication ([place]); Hyponatremia; Essential hypertension; Hyperlipidemia; Depression; Hypoglycemia; Hypokalemia; Hypothermia; Lactic acid acidosis; and Sepsis ([place]) on their problem list. Today she comes in for evaluation of her Foot Pain (bilateral); Hand Pain (bilateral); and Neck Pain  Pain Assessment: Location: Right, Left Foot Radiating: denies Onset: More than a month ago Duration: Chronic pain, Neuropathic pain Quality: Burning Severity: 5 /10 (self-reported pain score)  Note: Reported level is inconsistent with clinical observations. Clinically the patient looks like a 3/10 A 3/10 is viewed as "Moderate" and described as significantly interfering with activities of daily living (ADL). It becomes difficult to feed, bathe, get dressed, get on and off the toilet or to perform personal hygiene functions. Difficult to get in and out of bed or a chair without assistance. Very distracting. With effort, it can be ignored when deeply involved in activities.       When using our objective Pain Scale, levels between 6 and 10/10 are said to  belong in an emergency room, as it progressively worsens from a 6/10, described as severely limiting, requiring emergency care not usually available at an outpatient pain management facility. At a 6/10 level, communication becomes difficult and requires great effort. Assistance to reach the emergency department may be required. Facial flushing and profuse sweating along with potentially dangerous increases in heart rate and blood pressure will be evident. Effect on ADL: difficulty walking, imbalance Timing: Constant Modifying factors: lying down  Onset and Duration: Gradual and Present longer than 3 months Cause of pain: neuropathy Severity: Getting worse, NAS-11 at its worse: 6/10, NAS-11 at its best: 2/10, NAS-11 now: 5/10 and NAS-11 on the average: 6/10 Timing: Not influenced by the time of the day, During activity or exercise and After activity or exercise Aggravating Factors: Kneeling, Lifiting, Prolonged standing, Stooping , Twisting and Walking uphill Alleviating Factors: Stretching, Lying down, Medications, Resting and Sleeping Associated Problems: Constipation, Depression, Dizziness, Numbness, Swelling, Tingling and Weakness Quality of Pain: Burning, Constant, Sharp and Throbbing Previous Examinations or Tests: Biopsy, Bone scan, CT scan, MRI scan and X-rays Previous Treatments: Physical Therapy, Strengthening exercises and Trigger point injections  The patient comes into the clinics today for the first time for a chronic pain management evaluation.   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  Patient is a type I diabetic and has been dealing with neuropathy for greater than 20 years.  She is currently in the process of weaning off of her gabapentin and is on Lyrica 50 mg 3 times daily.  Patient is also on Effexor 75 mg twice daily.  She describes burning  and tingling in her feet which is very  troubling and uncomfortable.  It often wakes her up at night.  Patient is not on any opioid medications.  Today I took the time to provide the patient with information regarding my pain practice. The patient was informed that my practice is divided into two sections: an interventional pain management section, as well as a completely separate and distinct medication management section. I explained that I have procedure days for my interventional therapies, and evaluation days for follow-ups and medication management. Because of the amount of documentation required during both, they are kept separated. This means that there is the possibility that she may be scheduled for a procedure on one day, and medication management the next. I have also informed her that because of staffing and facility limitations, I no longer take patients for medication management only. To illustrate the reasons for this, I gave the patient the example of surgeons, and how inappropriate it would be to refer a patient to his/her care, just to write for the post-surgical antibiotics on a surgery done by a different surgeon.   Because interventional pain management is my board-certified specialty, the patient was informed that joining my practice means that they are open to any and all interventional therapies. I made it clear that this does not mean that they will be forced to have any procedures done. What this means is that I believe interventional therapies to be essential part of the diagnosis and proper management of chronic pain conditions. Therefore, patients not interested in these interventional alternatives will be better served under the care of a different practitioner.  The patient was also made aware of my Comprehensive Pain Management Safety Guidelines where by joining my practice, they limit all of their nerve blocks and joint injections to those done by our practice, for as long as we are retained to manage their care.     [place] PMP: Six (6) year initial data search conducted.              Meds   Current Outpatient Medications:  .  Ascorbic Acid (VITAMIN C PO), Take 1 tablet by mouth daily with breakfast., Disp: , Rfl:  .  aspirin 325 MG EC tablet, Take 325 mg by mouth daily with breakfast., Disp: , Rfl:  .  butalbital-acetaminophen-caffeine (FIORICET, ESGIC) 50-325-40 MG tablet, Take 0.5-1 tablets by mouth 3 (three) times daily as needed for headache., Disp: , Rfl:  .  Calcium Carb-Cholecalciferol (CALCIUM 600 + D PO), Take 1 tablet by mouth every evening., Disp: , Rfl:  .  carboxymethylcellulose (REFRESH PLUS) 0.5 % SOLN, Take 1-2 drops by mouth daily after breakfast., Disp: , Rfl:  .  gabapentin (NEURONTIN) 300 MG capsule, Take 300 mg by mouth 3 (three) times daily. Takes in the morning, at dinner, and at bedtime, Disp: , Rfl:  .  Insulin Human (INSULIN PUMP) SOLN, Inject 0.4-4 each into the skin continuous. Uses Novolog. ( pt states she takes 20 units cont through out day) pump is not on currently, Disp: , Rfl:  .  IRON, FERROUS GLUCONATE, PO, Take 1 tablet by mouth every evening., Disp: , Rfl:  .  losartan (COZAAR) 100 MG tablet, Take 100 mg by mouth every evening., Disp: , Rfl:  .  Multiple Vitamin (MULTIVITAMIN WITH MINERALS) TABS tablet, Take 1 tablet by mouth every evening., Disp: , Rfl:  .  polyethylene glycol (MIRALAX / GLYCOLAX) packet, Take 17 g by mouth every other day., Disp: ,  Rfl:  .  pregabalin (LYRICA) 50 MG capsule, Take 2 capsules (100 mg total) by mouth 3 (three) times daily., Disp: 180 capsule, Rfl: 1 .  promethazine (PHENERGAN) 25 MG suppository, Place 25 mg rectally every 6 (six) hours as needed for nausea or vomiting., Disp: , Rfl:  .  simvastatin (ZOCOR) 20 MG tablet, Take 20 mg by mouth every evening., Disp: , Rfl:  .  venlafaxine (EFFEXOR) 75 MG tablet, Take 75 mg by mouth 2 (two) times daily., Disp: , Rfl:  .  warfarin (COUMADIN) 5 MG tablet, Take 7.5 mg by mouth daily at 6 PM. ,  Disp: , Rfl:  .  doxycycline (ORACEA) 40 MG capsule, Take 40 mg by mouth every evening., Disp: , Rfl:  .  folic acid (FOLVITE) 1 MG tablet, Take 1 mg by mouth daily., Disp: , Rfl:  .  traMADol (ULTRAM) 50 MG tablet, Take 50 mg by mouth every 6 (six) hours as needed., Disp: , Rfl:  .  VITAMIN E PO, Take 1 tablet by mouth every evening., Disp: , Rfl:   Imaging Review   Cervical CT wo contrast:  Results for orders placed during the hospital encounter of 05/25/15  CT Cervical Spine Wo Contrast   Narrative CLINICAL DATA:  Pain following fall  EXAM: CT HEAD WITHOUT CONTRAST  CT MAXILLOFACIAL WITHOUT CONTRAST  CT CERVICAL SPINE WITHOUT CONTRAST  TECHNIQUE: Multidetector CT imaging of the head, cervical spine, and maxillofacial structures were performed using the standard protocol without intravenous contrast. Multiplanar CT image reconstructions of the cervical spine and maxillofacial structures were also generated.  COMPARISON:  Head CT December 01, 2014; cervical MRI June 25, 2013  FINDINGS: CT HEAD FINDINGS  There is mild diffuse atrophy. There is no intracranial mass hemorrhage, extra-axial fluid collection, or midline shift. There is patchy small vessel disease in the centra semiovale bilaterally. 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 There is small vessel disease in the anterior limb of the left internal capsule, stable as well. Small lacunar infarcts are noted in each thalamus. There is evidence of a small lacunar infarct in the mid left cerebellum, stable. There is small vessel disease in the lower pons bilaterally, stable. There is no new gray-white compartment lesion. No acute infarct evident. The bony calvarium appears intact. The mastoid air cells are clear.  CT MAXILLOFACIAL FINDINGS  There is a slightly displaced fracture of the left mid porch nasal bone. A tiny avulsion off the distal aspect of the nasion is  also noted. There is a nondisplaced fracture along the lateral aspect of the anterior left maxillary antrum. There is a subtle nondisplaced fracture of the lateral left maxillary antrum. No other fractures are identified. No dislocation.  There is soft tissue swelling over the left side of the face. There is no intraorbital lesion.  There is a small air-fluid level in the right maxillary antrum. The other paranasal sinuses are clear. The ostiomeatal unit complexes are patent bilaterally. There is slight leftward deviation the nasal septum. There is no nares obstruction.  Salivary glands appear symmetric and normal bilaterally. No adenopathy.  CT CERVICAL SPINE FINDINGS  There is no demonstrable fracture. There is minimal anterolisthesis of C3 on C4. There is minimal retrolisthesis of C4 on C5. There is minimal retrolisthesis of C5 on C6. No other spondylolisthesis. Prevertebral soft tissues and predental space regions are normal. There is moderately severe disc space narrowing at C4-5 and C5-6. There  is moderate narrowing at C3-4 and C6-7. There is multilevel facet hypertrophy. No frank disc extrusion or stenosis. There is moderate exit foraminal narrowing at C4-5 and C5-6 bilaterally. No frank disc extrusion or stenosis. Bones are somewhat osteoporotic. There is calcification in each carotid artery.  IMPRESSION: CT head: Atrophy with prior small infarcts and small vessel disease, both supratentorial and infratentorial. No acute infarct evident. No hemorrhage or mass effect. No extra-axial fluid collections.  CT maxillofacial: Slightly displaced fracture mid portion left nasal bone. Rather subtle fracture along the lateral aspect of the anterior left maxillary antrum. There is also a subtle nondisplaced fracture of the lateral left maxillary antrum. There is a tiny avulsion along the distal most aspect of the nasion midline. No dislocations. Small air-fluid level right  maxillary antrum. Other paranasal sinuses clear. The ostia BB complexes are patent bilaterally. There is mild leftward deviation of the nasal septum.  CT cervical spine: No fracture. Slight spondylolisthesis at several levels is felt to be due to underlying spondylosis. 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 No disc extrusion or stenosis apparent. Bones osteoporotic. Calcification in each carotid artery.   Electronically Signed   By: Lowella Grip III M.D.   On: 05/25/2015 12:42     Complexity Note: Imaging results reviewed. Results shared with Ms. Klinge, using Layman's terms.                         ROS  Cardiovascular History: Daily Aspirin intake, High blood pressure and Blood thinners:  Anticoagulant Pulmonary or Respiratory History: Snoring  Neurological History: Stroke (Residual deficits or weakness: weakness) and Abnormal skin sensations (Peripheral Neuropathy) Review of Past Neurological Studies:  Results for orders placed or performed during the hospital encounter of 11/26/16  CT Head Wo Contrast   Narrative   CLINICAL DATA:  Found down.  Unresponsive.  On Coumadin.  EXAM: CT HEAD WITHOUT CONTRAST  TECHNIQUE: Contiguous axial images were obtained from the base of the skull through the vertex without intravenous contrast.  COMPARISON:  05/25/2015  FINDINGS: Brain: No evidence of acute infarction, hemorrhage, hydrocephalus, extra-axial collection or mass lesion/mass effect. Stable mild to moderate cerebral and cerebellar atrophy. No significant change in appearance of chronic small vessel disease, and multiple old lacunar infarcts involving the basal ganglia and thalami bilaterally.  Vascular: No hyperdense vessel or unexpected calcification.  Skull: Normal. Negative for fracture or focal lesion.  Sinuses/Orbits: No acute finding.  Other: None.  IMPRESSION: No acute intracranial abnormality.  Stable cerebral and cerebellar atrophy,  chronic small vessel disease, and old lacunar infarcts.   Electronically Signed   By: Earle Gell M.D.   On: 11/26/2016 21:36   Results for orders placed or performed during the hospital encounter of 12/10/03  MR Brain W Wo Contrast   Narrative   Clinical Data:  Severe vertigo when sitting up since this morning. MRI OF THE BRAIN WITHOUT AND WITH CONTRAST AND MRA HEAD WITHOUT Multiplanar T1- and T2-weighted images were obtained before and after the administration of 15 ml of Omniscan.  Sagittal T1-weighted images demonstrate an old midpontine stroke.  Axial T1-weighted images demonstrate old left basal ganglia, bilateral thalamic, and left pontine strokes.  Diffusion images show no acute stroke.  FLAIR images show scattered changes of small vessel disease.  T1-weighted images are unremarkable.  Following the administration of contrast, there is no abnormal intracranial enhancement.  IMPRESSION  1.  Atrophy and small vessel disease. 2.  Numerous bilateral  lacunar infarcts.  3.  No acute stroke is seen.  4.  No abnormal intracranial enhancement.  MR ANGIOGRAPHY OF THE INTRACRANIAL CIRCULATION  3-D time of flight technique was performed through the vessels of the brain centered at the circle of Willis. The basilar artery is widely patent with the left vertebral the sole contributor.  Bilateral internal carotid artery siphon disease is present, worse on the right with an estimated 75 to 90 percent on the right and 50 to 75 percent on the left.  Good intracranial flow is identified.  There is no intracranial aneurysm seen.  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  See above comments.  Provider: Margaretmary Eddy  MR Angiogram Head Wo Contrast   Narrative   Clinical Data:  Severe vertigo when sitting up since this morning. MRI OF THE BRAIN WITHOUT AND WITH CONTRAST AND MRA HEAD WITHOUT Multiplanar T1- and T2-weighted images were obtained before and after the administration  of 15 ml of Omniscan.  Sagittal T1-weighted images demonstrate an old midpontine stroke.  Axial T1-weighted images demonstrate old left basal ganglia, bilateral thalamic, and left pontine strokes.  Diffusion images show no acute stroke.  FLAIR images show scattered changes of small vessel disease.  T1-weighted images are unremarkable.  Following the administration of contrast, there is no abnormal intracranial enhancement.  IMPRESSION  1.  Atrophy and small vessel disease. 2.  Numerous bilateral lacunar infarcts.  3.  No acute stroke is seen.  4.  No abnormal intracranial enhancement.  MR ANGIOGRAPHY OF THE INTRACRANIAL CIRCULATION  3-D time of flight technique was performed through the vessels of the brain centered at the circle of Willis. The basilar artery is widely patent with the left vertebral the sole contributor.  Bilateral internal carotid artery siphon disease is present, worse on the right with an estimated 75 to 90 percent on the right and 50 to 75 percent on the left.  Good intracranial flow is identified.  There is no intracranial aneurysm seen.  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  See above comments.  Provider: Margaretmary Eddy   Psychological-Psychiatric History: Depressed Gastrointestinal History: Irregular, infrequent bowel movements (Constipation) Genitourinary History: No reported renal or genitourinary signs or symptoms such as difficulty voiding or producing urine, peeing blood, non-functioning kidney, kidney stones, difficulty emptying the bladder, difficulty controlling the flow of urine, or chronic kidney disease Hematological History: Brusing easily and Bleeding easily Endocrine History: High blood sugar requiring insulin (IDDM) and Slow thyroid Rheumatologic History: Rheumatoid arthritis Musculoskeletal History: Negative for myasthenia gravis, muscular dystrophy, multiple sclerosis or malignant hyperthermia Work History: Disabled  Allergies   Ms. Wickizer is allergic to codeine; meperidine; metoclopramide hcl; oxycodone; stadol  [butorphanol]; tussionex pennkinetic er [hydrocod polst-cpm polst er]; and carbocaine  [mepivacaine hcl].  Laboratory Chemistry  Inflammation Markers (CRP: Acute Phase) (ESR: Chronic Phase) Lab Results  Component Value Date   LATICACIDVEN 1.82 11/26/2016                         Rheumatology Markers No results found for: RF, ANA, Therisa Doyne, [facility]              Renal Function Markers Lab Results  Component Value Date   BUN 9 11/30/2016   CREATININE 0.56 11/30/2016   GFRAA >60 11/30/2016   GFRNONAA >60 11/30/2016                 Hepatic Function Markers Lab Results  Component Value Date   AST 44 (H) 11/28/2016   ALT 22 11/28/2016   ALBUMIN 3.4 (L) 11/28/2016   ALKPHOS 55 11/28/2016   LIPASE 15 11/26/2016                 Electrolytes Lab Results  Component Value Date   NA 137 11/30/2016   K 4.0 11/30/2016   CL 100 (L) 11/30/2016   CALCIUM 8.9 11/30/2016                        Neuropathy Markers Lab Results  Component Value Date   HGBA1C 6.2 (H) 05/27/2015                 Bone Pathology Markers No results found for: VD25OH, [id number], VQ2595GL8, VF6433IR5, 25OHVITD1, 25OHVITD2, 25OHVITD3, TESTOFREE, TESTOSTERONE                       Coagulation Parameters Lab Results  Component Value Date   INR 1.94 11/30/2016   LABPROT 22.4 (H) 11/30/2016   APTT 29 11/26/2016   PLT 190 11/28/2016                 Cardiovascular Markers Lab Results  Component Value Date   BNP 23.4 11/26/2016   CKTOTAL 100 11/26/2016   TROPONINI < 0.02 10/19/2012   HGB 10.5 (L) 11/28/2016   HCT 31.7 (L) 11/28/2016                 CA Markers No results found for: CEA, CA125, LABCA2               Note: Lab results reviewed.  PFSH  Drug: Ms. Arca  reports that she does not use drugs. Alcohol:  reports that she does not drink alcohol. Tobacco:  reports that  has  never smoked. she has never used smokeless tobacco. Medical:  has a past medical history of At high risk for falls, Basal cell carcinoma, Complication of anesthesia, Diabetes mellitus without complication ([place]), Diabetic neuropathy ([place]), Diabetic retinopathy ([place]), Dizziness, Gastroparesis, Herniated disc, cervical, Hypercholesteremia, Hypertension, Hypotensive episode, Hypothyroidism, Insulin pump in place, Myofascial muscle pain, Numbness of arm, Osteoporosis, Poor vision, Seizures ([place]), Slurred speech, Stroke ([place]), Vertigo, and Wears glasses. Family: family history is not on file.  Past Surgical History:  Procedure Laterality Date  . APPENDECTOMY    . AUGMENTATION MAMMAPLASTY Bilateral 2011  . BILATERAL CARPAL TUNNEL RELEASE    . COLONOSCOPY    . EYE SURGERY    . HEMORROIDECTOMY    . HERNIA REPAIR    . JOINT REPLACEMENT    . OPEN REDUCTION INTERNAL FIXATION (ORIF) DISTAL RADIAL FRACTURE Right 05/29/2015   Procedure: OPEN REDUCTION INTERNAL FIXATION (ORIF) RIGHT DISTAL RADIUS FRACTURE WITH ALLOGRAFT BONE GRAFTING FOR REPAIR AND RECONSTRUCTION;  Surgeon: Roseanne Kaufman, MD;  Location: [place];  Service: Orthopedics;  Laterality: Right;  . SHOULDER ARTHROSCOPY Left   . SHOULDER HEMI-ARTHROPLASTY Left   . SYNOVECTOMY    . TONSILLECTOMY    . TRIGGER FINGER RELEASE    . TUBAL LIGATION     Active Ambulatory Problems    Diagnosis Date Noted  . Right radial fracture 05/29/2015  . Type 1 diabetes mellitus with complication ([place])   . Hyponatremia   . Essential hypertension   . Hyperlipidemia   . Depression   . Hypoglycemia 11/26/2016  . Hypokalemia   . Hypothermia   . Lactic acid acidosis   . Sepsis ([place])  Resolved Ambulatory Problems    Diagnosis Date Noted  . No Resolved Ambulatory Problems   Past Medical History:  Diagnosis Date  . At high risk for falls   . Basal cell carcinoma   . Complication of anesthesia   . Diabetes mellitus without complication ([place])   . Diabetic  neuropathy ([place])   . Diabetic retinopathy ([place])   . Dizziness   . Gastroparesis   . Herniated disc, cervical   . Hypercholesteremia   . Hypertension   . Hypotensive episode   . Hypothyroidism   . Insulin pump in place   . Myofascial muscle pain   . Numbness of arm   . Osteoporosis   . Poor vision   . Seizures ([place])   . Slurred speech   . Stroke ([place])   . Vertigo   . Wears glasses    Constitutional Exam  General appearance: Well nourished, well developed, and well hydrated. In no apparent acute distress Vitals:   10/04/17 1304  BP: (!) 116/55  Pulse: 75  Resp: 14  Temp: 97.6 F (36.4 C)  TempSrc: Oral  SpO2: 100%  Weight: 108 lb (49 kg)  Height: 5' 2"  (1.575 m)   BMI Assessment: Estimated body mass index is 19.75 kg/m as calculated from the following:   Height as of this encounter: 5' 2"  (1.575 m).   Weight as of this encounter: 108 lb (49 kg).  BMI interpretation table: BMI level Category Range association with higher incidence of chronic pain  <18 kg/m2 Underweight   18.5-24.9 kg/m2 Ideal body weight   25-29.9 kg/m2 Overweight Increased incidence by 20%  30-34.9 kg/m2 Obese (Class I) Increased incidence by 68%  35-39.9 kg/m2 Severe obesity (Class II) Increased incidence by 136%  >40 kg/m2 Extreme obesity (Class III) Increased incidence by 254%   BMI Readings from Last 4 Encounters:  10/04/17 19.75 kg/m  11/30/16 18.11 kg/m  05/29/15 21.16 kg/m  05/27/15 21.35 kg/m   Wt Readings from Last 4 Encounters:  10/04/17 108 lb (49 kg)  11/30/16 108 lb 12.8 oz (49.4 kg)  05/29/15 112 lb (50.8 kg)  05/27/15 113 lb (51.3 kg)  Psych/Mental status: Alert, oriented x 3 (person, place, & time)       Eyes: PERLA Respiratory: No evidence of acute respiratory distress  Cervical Spine Area Exam  Skin & Axial Inspection: No masses, redness, edema, swelling, or associated skin lesions Alignment: Symmetrical Functional ROM: Unrestricted ROM      Stability: No  instability detected Muscle Tone/Strength: Functionally intact. No obvious neuro-muscular anomalies detected. Sensory (Neurological): Unimpaired Palpation: No palpable anomalies              Upper Extremity (UE) Exam    Side: Right upper extremity  Side: Left upper extremity  Skin & Extremity Inspection: Skin color, temperature, and hair growth are WNL. No peripheral edema or cyanosis. No masses, redness, swelling, asymmetry, or associated skin lesions. No contractures.  Skin & Extremity Inspection: Skin color, temperature, and hair growth are WNL. No peripheral edema or cyanosis. No masses, redness, swelling, asymmetry, or associated skin lesions. No contractures.  Functional ROM: Unrestricted ROM          Functional ROM: Unrestricted ROM          Muscle Tone/Strength: Functionally intact. No obvious neuro-muscular anomalies detected.  Muscle Tone/Strength: Functionally intact. No obvious neuro-muscular anomalies detected.  Sensory (Neurological): Unimpaired          Sensory (Neurological): Unimpaired  Palpation: No palpable anomalies              Palpation: No palpable anomalies              Specialized Test(s): Deferred         Specialized Test(s): Deferred          Thoracic Spine Area Exam  Skin & Axial Inspection: No masses, redness, or swelling Alignment: Symmetrical Functional ROM: Unrestricted ROM Stability: No instability detected Muscle Tone/Strength: Functionally intact. No obvious neuro-muscular anomalies detected. Sensory (Neurological): Unimpaired Muscle strength & Tone: No palpable anomalies  Lumbar Spine Area Exam  Skin & Axial Inspection: No masses, redness, or swelling Alignment: Symmetrical Functional ROM: Unrestricted ROM      Stability: No instability detected Muscle Tone/Strength: Functionally intact. No obvious neuro-muscular anomalies detected. Sensory (Neurological): Unimpaired Palpation: No palpable anomalies       Provocative Tests: Lumbar  Hyperextension and rotation test: evaluation deferred today       Lumbar Lateral bending test: evaluation deferred today       Patrick's Maneuver: evaluation deferred today                    Gait & Posture Assessment  Ambulation: Unassisted Gait: Shuffling gait Posture: WNL   Lower Extremity Exam    Side: Right lower extremity  Side: Left lower extremity  Skin & Extremity Inspection: Skin color, temperature, and hair growth are WNL. No peripheral edema or cyanosis. No masses, redness, swelling, asymmetry, or associated skin lesions. No contractures.  Skin & Extremity Inspection: Skin color, temperature, and hair growth are WNL. No peripheral edema or cyanosis. No masses, redness, swelling, asymmetry, or associated skin lesions. No contractures.  Functional ROM: Unrestricted ROM          Functional ROM: Unrestricted ROM          Muscle Tone/Strength: Functionally intact. No obvious neuro-muscular anomalies detected.  Muscle Tone/Strength: Functionally intact. No obvious neuro-muscular anomalies detected.  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 Diagnoses of Type 1 diabetes mellitus with complication (HCC), Numbness and tingling of both legs, and Chronic pain syndrome were also pertinent to this visit.  Visit Diagnosis (New problems to examiner): 1. 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 Type 1 diabetes mellitus with complication (HCC)   3. Numbness and tingling of both legs   4. Chronic pain syndrome    General Recommendations: The pain condition that the patient suffers from is best treated with a multidisciplinary approach that involves an increase  in physical activity to prevent de-conditioning and worsening of the pain cycle, as well as psychological counseling (formal and/or informal) to address the co-morbid psychological affects of pain. Treatment will often involve judicious use of pain medications and interventional procedures to decrease the pain, allowing the patient to participate in the physical activity that will ultimately produce long-lasting pain reductions. The goal of the multidisciplinary approach is to return the patient to a higher level of overall function and to restore their ability to perform activities of daily living.  72 year old female with a history of prior CVA who presents with lower extremity paresthesias most pronounced in her ankles and feet secondary to diabetic polyneuropathy from type 1 diabetes.  Patient has been  dealing with neuropathy for greater than 20 years.  She has an insulin pump in place.  She is currently weaning off gabapentin which she did not find effective.  She is on Lyrica 50 mg 3 times daily.  We discussed titrating up her Lyrica to 100 mg 3 times a day in a stepwise fashion.  Told the patient to increase her nighttime dose to 100 mg for 2-3 days then increase her afternoon dose for 2-3 days and then her morning dose if no side effects.  After this dose escalation, patient should be on Lyrica 100 mg 3 times daily.  I also discussed the utility of alpha lipoic acid in the treatment of diabetic polyneuropathy.  Patient can obtain this from over-the-counter.  Patient will follow-up with me in 4-6 weeks for medication management.  Plan: -Increase Lyrica to 100 mg 3 times daily as detailed above (only after Gabapentin has been stopped which will be next week according to wean plan) -Continue Effexor 75 mg twice daily -Recommend alpha lipoic acid for diabetic polyneuropathy that the patient can obtain from over-the-counter.  Pharmacotherapy (current): Medications ordered:  Meds ordered this encounter   Medications  . pregabalin (LYRICA) 50 MG capsule    Sig: Take 2 capsules (100 mg total) by mouth 3 (three) times daily.    Dispense:  180 capsule    Refill:  1   Medications administered during this visit: Leoma Folds. Coulson had no medications administered during this visit.    Provider-requested follow-up: Return in about 4 weeks (around 11/01/2017) for Medication Management.  Future Appointments  Date Time Provider [place]  10/30/2017 10:45 AM Gillis Santa, MD [facility] None    Primary Care Physician: Juluis Pitch, MD Location: [facility] Note by: Gillis Santa, M.D, Date: 10/04/2017; Time: 2:38 PM  Patient Instructions  1.  When you have weaned off of your gabapentin next week start increasing her Lyrica so that you are taking 100 mg 3 times a day.  We discussed increasing this in a stepwise manner from 50 mg 3 times a day.  Increase by 1 tablet at each dose for 3 days before increasing subsequent dose until you are taking 100 mg 3 times a day.  2.  Recommend alpha lipoic acid for diabetic polyneuropathy which he can pick up from [facility], [facility], [facility].  3.  Follow-up in 4-6 weeks.

## 2017-10-04 NOTE — Patient Instructions (Signed)
1.  When you have weaned off of your gabapentin next week start increasing her Lyrica so that you are taking 100 mg 3 times a day.  We discussed increasing this in a stepwise manner from 50 mg 3 times a day.  Increase by 1 tablet at each dose for 3 days before increasing subsequent dose until you are taking 100 mg 3 times a day.  2.  Recommend alpha lipoic acid for diabetic polyneuropathy which he can pick up from [facility], [facility], [facility].  3.  Follow-up in 4-6 weeks.

## 2017-10-04 NOTE — Progress Notes (Signed)
Safety precautions to be maintained throughout the outpatient stay will include: orient to surroundings, keep bed in low position, maintain call bell within reach at all times, provide assistance with transfer out of bed and ambulation.  

## 2017-10-30 ENCOUNTER — Other Ambulatory Visit: Payer: Self-pay

## 2017-10-30 ENCOUNTER — Ambulatory Visit
Payer: Medicare Other | Attending: Student in an Organized Health Care Education/Training Program | Admitting: Student in an Organized Health Care Education/Training Program

## 2017-10-30 ENCOUNTER — Encounter: Payer: Self-pay | Admitting: Student in an Organized Health Care Education/Training Program

## 2017-10-30 ENCOUNTER — Ambulatory Visit
Admission: RE | Admit: 2017-10-30 | Discharge: 2017-10-30 | Disposition: A | Discharge status: Home / Self Care | Payer: Medicare Other | Source: Ambulatory Visit | Attending: Student in an Organized Health Care Education/Training Program | Admitting: Student in an Organized Health Care Education/Training Program

## 2017-10-30 VITALS — BP 126/64 | HR 68 | Temp 98.0°F | Resp 18 | Ht 62.0 in | Wt 110.0 lb

## 2017-10-30 DIAGNOSIS — Z7982 Long term (current) use of aspirin: Secondary | ICD-10-CM | POA: Insufficient documentation

## 2017-10-30 DIAGNOSIS — E10649 Type 1 diabetes mellitus with hypoglycemia without coma: Secondary | ICD-10-CM | POA: Insufficient documentation

## 2017-10-30 DIAGNOSIS — X58XXXA Exposure to other specified factors, initial encounter: Secondary | ICD-10-CM | POA: Diagnosis not present

## 2017-10-30 DIAGNOSIS — Z794 Long term (current) use of insulin: Secondary | ICD-10-CM | POA: Diagnosis not present

## 2017-10-30 DIAGNOSIS — S5291XA Unspecified fracture of right forearm, initial encounter for closed fracture: Secondary | ICD-10-CM | POA: Insufficient documentation

## 2017-10-30 DIAGNOSIS — R200 Anesthesia of skin: Secondary | ICD-10-CM | POA: Diagnosis not present

## 2017-10-30 DIAGNOSIS — E876 Hypokalemia: Secondary | ICD-10-CM | POA: Insufficient documentation

## 2017-10-30 DIAGNOSIS — Z9641 Presence of insulin pump (external) (internal): Secondary | ICD-10-CM | POA: Diagnosis not present

## 2017-10-30 DIAGNOSIS — Z9851 Tubal ligation status: Secondary | ICD-10-CM | POA: Insufficient documentation

## 2017-10-30 DIAGNOSIS — Z79899 Other long term (current) drug therapy: Secondary | ICD-10-CM | POA: Diagnosis not present

## 2017-10-30 DIAGNOSIS — E785 Hyperlipidemia, unspecified: Secondary | ICD-10-CM | POA: Diagnosis not present

## 2017-10-30 DIAGNOSIS — E108 Type 1 diabetes mellitus with unspecified complications: Secondary | ICD-10-CM | POA: Diagnosis not present

## 2017-10-30 DIAGNOSIS — Z5181 Encounter for therapeutic drug level monitoring: Secondary | ICD-10-CM | POA: Insufficient documentation

## 2017-10-30 DIAGNOSIS — R202 Paresthesia of skin: Secondary | ICD-10-CM | POA: Diagnosis not present

## 2017-10-30 DIAGNOSIS — Z7901 Long term (current) use of anticoagulants: Secondary | ICD-10-CM | POA: Diagnosis not present

## 2017-10-30 DIAGNOSIS — M79672 Pain in left foot: Secondary | ICD-10-CM | POA: Insufficient documentation

## 2017-10-30 DIAGNOSIS — E1042 Type 1 diabetes mellitus with diabetic polyneuropathy: Secondary | ICD-10-CM | POA: Diagnosis not present

## 2017-10-30 DIAGNOSIS — Z9889 Other specified postprocedural states: Secondary | ICD-10-CM | POA: Diagnosis not present

## 2017-10-30 DIAGNOSIS — G894 Chronic pain syndrome: Secondary | ICD-10-CM | POA: Diagnosis not present

## 2017-10-30 DIAGNOSIS — Z79891 Long term (current) use of opiate analgesic: Secondary | ICD-10-CM | POA: Insufficient documentation

## 2017-10-30 DIAGNOSIS — M79671 Pain in right foot: Secondary | ICD-10-CM | POA: Insufficient documentation

## 2017-10-30 DIAGNOSIS — I10 Essential (primary) hypertension: Secondary | ICD-10-CM | POA: Insufficient documentation

## 2017-10-30 DIAGNOSIS — F329 Major depressive disorder, single episode, unspecified: Secondary | ICD-10-CM | POA: Diagnosis not present

## 2017-10-30 MED ORDER — BUTALBITAL-APAP-CAFFEINE 50-325-40 MG PO TABS: 0.5000 | ORAL_TABLET | Freq: Two times a day (BID) | ORAL | 3 refills | Status: DC | PRN

## 2017-10-30 MED ORDER — AMITRIPTYLINE HCL 25 MG PO TABS: 25.0000 mg | ORAL_TABLET | Freq: Every day | ORAL | 1 refills | Status: DC

## 2017-10-30 NOTE — Patient Instructions (Addendum)
1. Stop Lyrica 2. GET EKG 3. Start Amitriptyline 25 mg at night 4. Fiorcet as needed 5. We will help get your Rx to [facility]

## 2017-10-30 NOTE — Progress Notes (Signed)
Patient's Name: Erin Daniels  MRN: [id number]  Referring Provider: Juluis Pitch, MD  DOB: 1945-10-15  PCP: Juluis Pitch, MD  DOS: 10/30/2017  Note by: Gillis Santa, MD  Service setting: Ambulatory outpatient  Specialty: Interventional Pain Management  Location: [facility] (AMB) Pain Management Facility    Patient type: Established   Primary Reason(s) for Visit: Encounter for prescription drug management. (Level of risk: moderate)  CC: Foot Pain (bilateral)  HPI  Erin Daniels is a 72 y.o. year old, female patient, who comes today for a medication management evaluation. She has Right radial fracture; Type 1 diabetes mellitus with complication ([place]); Hyponatremia; Essential hypertension; Hyperlipidemia; Depression; Hypoglycemia; Hypokalemia; Hypothermia; Lactic acid acidosis; and Sepsis ([place]) on their problem list. Her primarily concern today is the Foot Pain (bilateral)  Pain Assessment: Location: Right, Left Foot Radiating: denies Onset: More than a month ago Duration: Chronic pain Quality: Aching, Constant, Burning Severity: 7 /10 (self-reported pain score)  Note: Reported level is compatible with observation.                         When using our objective Pain Scale, levels between 6 and 10/10 are said to belong in an emergency room, as it progressively worsens from a 6/10, described as severely limiting, requiring emergency care not usually available at an outpatient pain management facility. At a 6/10 level, communication becomes difficult and requires great effort. Assistance to reach the emergency department may be required. Facial flushing and profuse sweating along with potentially dangerous increases in heart rate and blood pressure will be evident. Effect on ADL:   Timing: Constant Modifying factors: lying down  Erin Daniels was last scheduled for an appointment on 10/04/2017 for medication management. During today's appointment we reviewed Erin Daniels's chronic pain  status, as well as her outpatient medication regimen.  Patient returns for follow-up.  She states that increasing her Lyrica to 2 tablets 3 times a day resulted in dizziness and imbalance.  She did not find this medication very effective.  She continues Effexor 75 mg twice daily.  The patient  reports that she does not use drugs. Her body mass index is 20.12 kg/m.  Further details on both, my assessment(s), as well as the proposed treatment plan, please see below.  Controlled Substance Pharmacotherapy Assessment REMS (Risk Evaluation and Mitigation Strategy)  Monitoring: [place] PMP: Online review of the past 11-monthperiod conducted. Compliant with practice rules and regulations   Laboratory Chemistry  Inflammation Markers (CRP: Acute Phase) (ESR: Chronic Phase) Lab Results  Component Value Date   LATICACIDVEN 1.82 11/26/2016                         Rheumatology Markers No results found for: RF, ANA, LTherisa Doyne L[facility]             Renal Function Markers Lab Results  Component Value Date   BUN 9 11/30/2016   CREATININE 0.56 11/30/2016   GFRAA >60 11/30/2016   GFRNONAA >60 11/30/2016                 Hepatic Function Markers Lab Results  Component Value Date   AST 44 (H) 11/28/2016   ALT 22 11/28/2016   ALBUMIN 3.4 (L) 11/28/2016   ALKPHOS 55 11/28/2016   LIPASE 15 11/26/2016                 Electrolytes Lab Results  Component Value Date  NA 137 11/30/2016   K 4.0 11/30/2016   CL 100 (L) 11/30/2016   CALCIUM 8.9 11/30/2016                        Neuropathy Markers Lab Results  Component Value Date   HGBA1C 6.2 (H) 05/27/2015                 Bone Pathology Markers No results found for: VD25OH, [id number], TF5732KG2, [id number], 25OHVITD1, 25OHVITD2, 25OHVITD3, TESTOFREE, TESTOSTERONE                       Coagulation Parameters Lab Results  Component Value Date   INR 1.94 11/30/2016   LABPROT 22.4 (H) 11/30/2016   APTT 29  11/26/2016   PLT 190 11/28/2016                 Cardiovascular Markers Lab Results  Component Value Date   BNP 23.4 11/26/2016   CKTOTAL 100 11/26/2016   TROPONINI < 0.02 10/19/2012   HGB 10.5 (L) 11/28/2016   HCT 31.7 (L) 11/28/2016                 CA Markers No results found for: CEA, CA125, LABCA2               Note: Lab results reviewed.  Recent Diagnostic Imaging Results  MM DIGITAL SCREENING W/ IMPLANTS BILATERAL CLINICAL DATA:  Screening.  EXAM: DIGITAL SCREENING BILATERAL MAMMOGRAM WITH IMPLANTS AND CAD  The patient has retroglandular implants. Standard and implant displaced views were performed.  COMPARISON:  Previous exam(s).  ACR Breast Density Category d: The breast tissue is extremely dense, which lowers the sensitivity of mammography.  FINDINGS: There are no findings suspicious for malignancy. Extensive vascular calcifications noted. Images were processed with CAD.  IMPRESSION: No mammographic evidence of malignancy. A result letter of this screening mammogram will be mailed directly to the patient.  RECOMMENDATION: Screening mammogram in one year. (Code:[id number])  BI-RADS CATEGORY  1:  Negative.  Electronically Signed   By: Curlene Dolphin M.D.   On: 09/18/2017 10:12  Complexity Note: Imaging results reviewed. Results shared with Erin Daniels, using Layman's terms.                         Meds   Current Outpatient Medications:  .  Ascorbic Acid (VITAMIN C PO), Take 1 tablet by mouth daily with breakfast., Disp: , Rfl:  .  aspirin 325 MG EC tablet, Take 325 mg by mouth daily with breakfast., Disp: , Rfl:  .  butalbital-acetaminophen-caffeine (FIORICET, ESGIC) 50-325-40 MG tablet, Take 0.5-1 tablets by mouth 2 (two) times daily as needed for headache., Disp: 60 tablet, Rfl: 3 .  Calcium Carb-Cholecalciferol (CALCIUM 600 + D PO), Take 1 tablet by mouth every evening., Disp: , Rfl:  .  carboxymethylcellulose (REFRESH PLUS) 0.5 % SOLN, Take 1-2  drops by mouth daily after breakfast., Disp: , Rfl:  .  doxycycline (ORACEA) 40 MG capsule, Take 40 mg by mouth every evening., Disp: , Rfl:  .  folic acid (FOLVITE) 1 MG tablet, Take 1 mg by mouth daily., Disp: , Rfl:  .  Insulin Human (INSULIN PUMP) SOLN, Inject 0.4-4 each into the skin continuous. Uses Novolog. ( pt states she takes 20 units cont through out day) pump is not on currently, Disp: , Rfl:  .  IRON, FERROUS GLUCONATE, PO, Take 1 tablet by mouth every  evening., Disp: , Rfl:  .  losartan (COZAAR) 100 MG tablet, Take 100 mg by mouth every evening., Disp: , Rfl:  .  Multiple Vitamin (MULTIVITAMIN WITH MINERALS) TABS tablet, Take 1 tablet by mouth every evening., Disp: , Rfl:  .  polyethylene glycol (MIRALAX / GLYCOLAX) packet, Take 17 g by mouth every other day., Disp: , Rfl:  .  promethazine (PHENERGAN) 25 MG suppository, Place 25 mg rectally every 6 (six) hours as needed for nausea or vomiting., Disp: , Rfl:  .  simvastatin (ZOCOR) 20 MG tablet, Take 20 mg by mouth every evening., Disp: , Rfl:  .  venlafaxine (EFFEXOR) 75 MG tablet, Take 75 mg by mouth 2 (two) times daily., Disp: , Rfl:  .  VITAMIN E PO, Take 1 tablet by mouth every evening., Disp: , Rfl:  .  warfarin (COUMADIN) 5 MG tablet, Take 7.5 mg by mouth daily at 6 PM. , Disp: , Rfl:  .  amitriptyline (ELAVIL) 25 MG tablet, Take 1 tablet (25 mg total) by mouth at bedtime., Disp: 30 tablet, Rfl: 1 .  traMADol (ULTRAM) 50 MG tablet, Take 50 mg by mouth every 6 (six) hours as needed., Disp: , Rfl:   ROS  Constitutional: Denies any fever or chills Gastrointestinal: No reported hemesis, hematochezia, vomiting, or acute GI distress Musculoskeletal: Denies any acute onset joint swelling, redness, loss of ROM, or weakness Neurological: No reported episodes of acute onset apraxia, aphasia, dysarthria, agnosia, amnesia, paralysis, loss of coordination, or loss of consciousness  Allergies  Erin Daniels is allergic to codeine;  meperidine; metoclopramide hcl; oxycodone; stadol  [butorphanol]; tussionex pennkinetic er [hydrocod polst-cpm polst er]; and carbocaine  [mepivacaine hcl].  PFSH  Drug: Erin Daniels  reports that she does not use drugs. Alcohol:  reports that she does not drink alcohol. Tobacco:  reports that  has never smoked. she has never used smokeless tobacco. Medical:  has a past medical history of At high risk for falls, Basal cell carcinoma, Complication of anesthesia, Diabetes mellitus without complication ([place]), Diabetic neuropathy ([place]), Diabetic retinopathy ([place]), Dizziness, Gastroparesis, Herniated disc, cervical, Hypercholesteremia, Hypertension, Hypotensive episode, Hypothyroidism, Insulin pump in place, Myofascial muscle pain, Numbness of arm, Osteoporosis, Poor vision, Seizures ([place]), Slurred speech, Stroke ([place]), Vertigo, and Wears glasses. Surgical: Erin Daniels  has a past surgical history that includes Tonsillectomy; Hemorroidectomy; Hernia repair; Joint replacement; Appendectomy; Tubal ligation; Eye surgery; Trigger finger release; Bilateral carpal tunnel release; Synovectomy; Colonoscopy; Shoulder arthroscopy (Left); Shoulder hemi-arthroplasty (Left); Open reduction internal fixation (orif) distal radial fracture (Right, 05/29/2015); and Augmentation mammaplasty (Bilateral, 2011). Family: family history is not on file.  Constitutional Exam  General appearance: Well nourished, well developed, and well hydrated. In no apparent acute distress Vitals:   10/30/17 1148 10/30/17 1150  BP:  126/64  Pulse:  68  Resp:  18  Temp:  98 F (36.7 C)  SpO2:  99%  Weight: 110 lb (49.9 kg)   Height: 5' 2"  (1.575 m)    BMI Assessment: Estimated body mass index is 20.12 kg/m as calculated from the following:   Height as of this encounter: 5' 2"  (1.575 m).   Weight as of this encounter: 110 lb (49.9 kg).  BMI interpretation table: BMI level Category Range association with higher incidence of chronic  pain  <18 kg/m2 Underweight   18.5-24.9 kg/m2 Ideal body weight   25-29.9 kg/m2 Overweight Increased incidence by 20%  30-34.9 kg/m2 Obese (Class I) Increased incidence by 68%  35-39.9 kg/m2 Severe obesity (Class II)  Increased incidence by 136%  >40 kg/m2 Extreme obesity (Class III) Increased incidence by 254%   BMI Readings from Last 4 Encounters:  10/30/17 20.12 kg/m  10/04/17 19.75 kg/m  11/30/16 18.11 kg/m  05/29/15 21.16 kg/m   Wt Readings from Last 4 Encounters:  10/30/17 110 lb (49.9 kg)  10/04/17 108 lb (49 kg)  11/30/16 108 lb 12.8 oz (49.4 kg)  05/29/15 112 lb (50.8 kg)  Psych/Mental status: Alert, oriented x 3 (person, place, & time)       Eyes: PERLA Respiratory: No evidence of acute respiratory distress  Cervical Spine Area Exam  Skin & Axial Inspection: No masses, redness, edema, swelling, or associated skin lesions Alignment: Symmetrical Functional ROM: Unrestricted ROM      Stability: No instability detected Muscle Tone/Strength: Functionally intact. No obvious neuro-muscular anomalies detected. Sensory (Neurological): Unimpaired Palpation: No palpable anomalies              Upper Extremity (UE) Exam    Side: Right upper extremity  Side: Left upper extremity  Skin & Extremity Inspection: Skin color, temperature, and hair growth are WNL. No peripheral edema or cyanosis. No masses, redness, swelling, asymmetry, or associated skin lesions. No contractures.  Skin & Extremity Inspection: Skin color, temperature, and hair growth are WNL. No peripheral edema or cyanosis. No masses, redness, swelling, asymmetry, or associated skin lesions. No contractures.  Functional ROM: Unrestricted ROM          Functional ROM: Unrestricted ROM          Muscle Tone/Strength: Functionally intact. No obvious neuro-muscular anomalies detected.  Muscle Tone/Strength: Functionally intact. No obvious neuro-muscular anomalies detected.  Sensory (Neurological): Unimpaired          Sensory  (Neurological): Unimpaired          Palpation: No palpable anomalies              Palpation: No palpable anomalies              Specialized Test(s): Deferred         Specialized Test(s): Deferred          Thoracic Spine Area Exam  Skin & Axial Inspection: No masses, redness, or swelling Alignment: Symmetrical Functional ROM: Unrestricted ROM Stability: No instability detected Muscle Tone/Strength: Functionally intact. No obvious neuro-muscular anomalies detected. Sensory (Neurological): Unimpaired Muscle strength & Tone: No palpable anomalies  Lumbar Spine Area Exam  Skin & Axial Inspection: No masses, redness, or swelling Alignment: Symmetrical Functional ROM: Unrestricted ROM      Stability: No instability detected Muscle Tone/Strength: Functionally intact. No obvious neuro-muscular anomalies detected. Sensory (Neurological): Unimpaired Palpation: No palpable anomalies       Provocative Tests: Lumbar Hyperextension and rotation test: evaluation deferred today       Lumbar Lateral bending test: evaluation deferred today       Patrick's Maneuver: evaluation deferred today                    Gait & Posture Assessment  Ambulation: Unassisted Gait: Relatively normal for age and body habitus Posture: WNL   Lower Extremity Exam    Side: Right lower extremity  Side: Left lower extremity  Skin & Extremity Inspection: Skin color, temperature, and hair growth are WNL. No peripheral edema or cyanosis. No masses, redness, swelling, asymmetry, or associated skin lesions. No contractures.  Skin & Extremity Inspection: Skin color, temperature, and hair growth are WNL. No peripheral edema or cyanosis. No masses, redness, swelling, asymmetry, or  associated skin lesions. No contractures.  Functional ROM: Unrestricted ROM          Functional ROM: Unrestricted ROM          Muscle Tone/Strength: Functionally intact. No obvious neuro-muscular anomalies detected.  Muscle Tone/Strength: Functionally  intact. No obvious neuro-muscular anomalies detected.  Sensory (Neurological): Paresthesia (Tingling sensation)  Sensory (Neurological): Paresthesia (Tingling sensation)  Palpation: No palpable anomalies  Palpation: No palpable anomalies   Assessment  Primary Diagnosis & Pertinent Problem List: The primary encounter diagnosis was Diabetic polyneuropathy associated with type 1 diabetes mellitus ([place]). Diagnoses of Type 1 diabetes mellitus with complication (HCC), Numbness and tingling of both legs, and Chronic pain syndrome were also pertinent to this visit.  Status Diagnosis  Not improving Persistent Persistent 1. Diabetic polyneuropathy associated with type 1 diabetes mellitus ([place])   2. Type 1 diabetes mellitus with complication (HCC)   3. Numbness and tingling of both legs   4. Chronic pain syndrome      General Recommendations: The pain condition that the patient suffers from is best treated with a multidisciplinary approach that involves an increase in physical activity to prevent de-conditioning and worsening of the pain cycle, as well as psychological counseling (formal and/or informal) to address the co-morbid psychological affects of pain. Treatment will often involve judicious use of pain medications and interventional procedures to decrease the pain, allowing the patient to participate in the physical activity that will ultimately produce long-lasting pain reductions. The goal of the multidisciplinary approach is to return the patient to a higher level of overall function and to restore their ability to perform activities of daily living.  72 year old female with a history of prior CVA who presents with lower extremity paresthesias most pronounced in her ankles and feet secondary to diabetic polyneuropathy from type 1 diabetes.  Patient has been dealing with neuropathy for greater than 20 years.  She has an insulin pump in place. She states that increasing her Lyrica to 2 tablets 3  times a day resulted in dizziness and imbalance.  She did not find this medication very effective.  She continues Effexor 75 mg twice daily.  Today we discussed discontinuing Lyrica given side effects and relative ineffectiveness.  Patient has also tried gabapentin in the past and this was not effective.  We will consider trial of amitriptyline at 25 mg nightly to assist with her symptoms of diabetic neuropathy.  Patient is also requesting refill of Fioricet for her occasional headaches.  I will also obtain EKG given that we are starting the patient on amitriptyline to measure her QTC.  Her most recent EKG shows a QTC of 402 ms which was done 08/31/2016.   Plan of Care  Pharmacotherapy (Medications Ordered): Meds ordered this encounter  Medications  . butalbital-acetaminophen-caffeine (FIORICET, ESGIC) 50-325-40 MG tablet    Sig: Take 0.5-1 tablets by mouth 2 (two) times daily as needed for headache.    Dispense:  60 tablet    Refill:  3  . amitriptyline (ELAVIL) 25 MG tablet    Sig: Take 1 tablet (25 mg total) by mouth at bedtime.    Dispense:  30 tablet    Refill:  1   Lab-work, procedure(s), and/or referral(s): Orders Placed This Encounter  Procedures  . EKG 12-Lead   Provider-requested follow-up: Return in about 6 weeks (around 12/11/2017) for Medication Management. Time Note: Greater than 50% of the 25 minute(s) of face-to-face time spent with Erin Daniels, was spent in counseling/coordination of care regarding: the appropriate  use of the pain scale, Erin Daniels's primary cause of pain, the treatment plan, treatment alternatives, medication side effects and realistic expectations. Future Appointments  Date Time Provider [place]  12/10/2017  2:00 PM Gillis Santa, MD [facility] None    Primary Care Physician: Juluis Pitch, MD Location: [facility] Outpatient Pain Management Facility Note by: Gillis Santa, M.D Date: 10/30/2017; Time: 2:09 PM  Patient Instructions  1. Stop  Lyrica 2. GET EKG 3. Start Amitriptyline 25 mg at night 4. Fiorcet as needed 5. We will help get your [facility]

## 2017-10-30 NOTE — Progress Notes (Signed)
Safety precautions to be maintained throughout the outpatient stay will include: orient to surroundings, keep bed in low position, maintain call bell within reach at all times, provide assistance with transfer out of bed and ambulation.  

## 2017-11-11 ENCOUNTER — Observation Stay: Payer: Medicare Other

## 2017-11-11 ENCOUNTER — Inpatient Hospital Stay
Admission: EM | Admit: 2017-11-11 | Discharge: 2017-11-13 | DRG: 064 | Disposition: A | Discharge status: Home Health | Payer: Medicare Other | Attending: Internal Medicine | Admitting: Internal Medicine

## 2017-11-11 ENCOUNTER — Other Ambulatory Visit: Payer: Self-pay

## 2017-11-11 ENCOUNTER — Emergency Department: Payer: Medicare Other

## 2017-11-11 DIAGNOSIS — Z96612 Presence of left artificial shoulder joint: Secondary | ICD-10-CM | POA: Diagnosis present

## 2017-11-11 DIAGNOSIS — E860 Dehydration: Secondary | ICD-10-CM | POA: Diagnosis present

## 2017-11-11 DIAGNOSIS — R51 Headache: Secondary | ICD-10-CM | POA: Diagnosis present

## 2017-11-11 DIAGNOSIS — E1110 Type 2 diabetes mellitus with ketoacidosis without coma: Secondary | ICD-10-CM | POA: Diagnosis present

## 2017-11-11 DIAGNOSIS — Z7901 Long term (current) use of anticoagulants: Secondary | ICD-10-CM

## 2017-11-11 DIAGNOSIS — M5020 Other cervical disc displacement, unspecified cervical region: Secondary | ICD-10-CM | POA: Diagnosis present

## 2017-11-11 DIAGNOSIS — Z9181 History of falling: Secondary | ICD-10-CM

## 2017-11-11 DIAGNOSIS — Z79899 Other long term (current) drug therapy: Secondary | ICD-10-CM

## 2017-11-11 DIAGNOSIS — E785 Hyperlipidemia, unspecified: Secondary | ICD-10-CM | POA: Diagnosis present

## 2017-11-11 DIAGNOSIS — I10 Essential (primary) hypertension: Secondary | ICD-10-CM | POA: Diagnosis present

## 2017-11-11 DIAGNOSIS — Z885 Allergy status to narcotic agent status: Secondary | ICD-10-CM

## 2017-11-11 DIAGNOSIS — Z888 Allergy status to other drugs, medicaments and biological substances status: Secondary | ICD-10-CM

## 2017-11-11 DIAGNOSIS — Z794 Long term (current) use of insulin: Secondary | ICD-10-CM

## 2017-11-11 DIAGNOSIS — E7800 Pure hypercholesterolemia, unspecified: Secondary | ICD-10-CM | POA: Diagnosis present

## 2017-11-11 DIAGNOSIS — I639 Cerebral infarction, unspecified: Principal | ICD-10-CM | POA: Diagnosis present

## 2017-11-11 DIAGNOSIS — Z9641 Presence of insulin pump (external) (internal): Secondary | ICD-10-CM | POA: Diagnosis present

## 2017-11-11 DIAGNOSIS — R4182 Altered mental status, unspecified: Secondary | ICD-10-CM

## 2017-11-11 DIAGNOSIS — R410 Disorientation, unspecified: Secondary | ICD-10-CM | POA: Diagnosis present

## 2017-11-11 DIAGNOSIS — M810 Age-related osteoporosis without current pathological fracture: Secondary | ICD-10-CM | POA: Diagnosis present

## 2017-11-11 DIAGNOSIS — H1132 Conjunctival hemorrhage, left eye: Secondary | ICD-10-CM | POA: Diagnosis present

## 2017-11-11 DIAGNOSIS — E1010 Type 1 diabetes mellitus with ketoacidosis without coma: Secondary | ICD-10-CM | POA: Diagnosis present

## 2017-11-11 DIAGNOSIS — Z85828 Personal history of other malignant neoplasm of skin: Secondary | ICD-10-CM

## 2017-11-11 DIAGNOSIS — E10319 Type 1 diabetes mellitus with unspecified diabetic retinopathy without macular edema: Secondary | ICD-10-CM | POA: Diagnosis present

## 2017-11-11 DIAGNOSIS — Z8673 Personal history of transient ischemic attack (TIA), and cerebral infarction without residual deficits: Secondary | ICD-10-CM

## 2017-11-11 DIAGNOSIS — H547 Unspecified visual loss: Secondary | ICD-10-CM | POA: Diagnosis present

## 2017-11-11 DIAGNOSIS — E1043 Type 1 diabetes mellitus with diabetic autonomic (poly)neuropathy: Secondary | ICD-10-CM | POA: Diagnosis present

## 2017-11-11 DIAGNOSIS — E039 Hypothyroidism, unspecified: Secondary | ICD-10-CM | POA: Diagnosis present

## 2017-11-11 DIAGNOSIS — Z7982 Long term (current) use of aspirin: Secondary | ICD-10-CM

## 2017-11-11 LAB — COMPREHENSIVE METABOLIC PANEL
ALBUMIN: 4.2 g/dL (ref 3.5–5.0)
ALT: 19 U/L (ref 14–54)
ANION GAP: 13 (ref 5–15)
AST: 26 U/L (ref 15–41)
Alkaline Phosphatase: 72 U/L (ref 38–126)
BILIRUBIN TOTAL: 0.6 mg/dL (ref 0.3–1.2)
BUN: 21 mg/dL — ABNORMAL HIGH (ref 6–20)
CO2: 24 mmol/L (ref 22–32)
Calcium: 9.7 mg/dL (ref 8.9–10.3)
Chloride: 106 mmol/L (ref 101–111)
Creatinine, Ser: 0.44 mg/dL (ref 0.44–1.00)
GFR calc Af Amer: >60 mL/min (ref >60)
GFR calc non Af Amer: >60 mL/min (ref >60)
GLUCOSE: 144 mg/dL — AB (ref 65–99)
POTASSIUM: 3.5 mmol/L (ref 3.5–5.1)
Sodium: 143 mmol/L (ref 135–145)
TOTAL PROTEIN: 7.1 g/dL (ref 6.5–8.1)

## 2017-11-11 LAB — URINALYSIS, COMPLETE (UACMP) WITH MICROSCOPIC
BILIRUBIN URINE: NEGATIVE
Bacteria, UA: NONE SEEN
Glucose, UA: >=500 mg/dL — AB
Hgb urine dipstick: NEGATIVE
Ketones, ur: 20 mg/dL — AB
LEUKOCYTES UA: NEGATIVE
Nitrite: NEGATIVE
Protein, ur: NEGATIVE mg/dL
SPECIFIC GRAVITY, URINE: 1.022 (ref 1.005–1.030)
Squamous Epithelial / LPF: NONE SEEN
pH: 5 (ref 5.0–8.0)

## 2017-11-11 LAB — CBC
HEMATOCRIT: 38.6 % (ref 35.0–47.0)
Hemoglobin: 12.9 g/dL (ref 12.0–16.0)
MCH: 30.5 pg (ref 26.0–34.0)
MCHC: 33.4 g/dL (ref 32.0–36.0)
MCV: 91.3 fL (ref 80.0–100.0)
Platelets: 226 10*3/uL (ref 150–440)
RBC: 4.23 MIL/uL (ref 3.80–5.20)
RDW: 12 % (ref 11.5–14.5)
WBC: 6.7 10*3/uL (ref 3.6–11.0)

## 2017-11-11 LAB — GLUCOSE, CAPILLARY
Glucose-Capillary: 219 mg/dL — ABNORMAL HIGH (ref 65–99)
Glucose-Capillary: 245 mg/dL — ABNORMAL HIGH (ref 65–99)
Glucose-Capillary: 289 mg/dL — ABNORMAL HIGH (ref 65–99)
Glucose-Capillary: 260 mg/dL — ABNORMAL HIGH (ref 65–99)

## 2017-11-11 LAB — HEMOGLOBIN A1C
Hgb A1c MFr Bld: 6.1 % — ABNORMAL HIGH (ref 4.8–5.6)
Mean Plasma Glucose: 128.37 mg/dL

## 2017-11-11 LAB — TROPONIN I: Troponin I: <0.03 ng/mL (ref <0.03)

## 2017-11-11 LAB — TSH: TSH: 1.912 u[IU]/mL (ref 0.350–4.500)

## 2017-11-11 LAB — PROTIME-INR
INR: 1.87
Prothrombin Time: 21.4 seconds — ABNORMAL HIGH (ref 11.4–15.2)

## 2017-11-11 LAB — LACTIC ACID, PLASMA: Lactic Acid, Venous: 1.2 mmol/L (ref 0.5–1.9)

## 2017-11-11 MED ORDER — VENLAFAXINE HCL 37.5 MG PO TABS
75.0000 mg | ORAL_TABLET | Freq: Two times a day (BID) | ORAL | Status: DC
  Administered 2017-11-11 – 2017-11-13 (×4): 75 mg via ORAL
  Filled 2017-11-11: qty 1
  Filled 2017-11-11: qty 2
  Filled 2017-11-11: qty 1
  Filled 2017-11-11 (×3): qty 2

## 2017-11-11 MED ORDER — ONDANSETRON HCL 4 MG PO TABS: 4.0000 mg | ORAL_TABLET | Freq: Four times a day (QID) | ORAL | Status: DC | PRN

## 2017-11-11 MED ORDER — ONDANSETRON HCL 4 MG/2ML IJ SOLN
4.0000 mg | Freq: Four times a day (QID) | INTRAMUSCULAR | Status: DC | PRN
  Administered 2017-11-11 – 2017-11-12 (×2): 4 mg via INTRAVENOUS
  Filled 2017-11-11 (×2): qty 2

## 2017-11-11 MED ORDER — INSULIN ASPART 100 UNIT/ML ~~LOC~~ SOLN
0.0000 [IU] | Freq: Three times a day (TID) | SUBCUTANEOUS | Status: DC
  Administered 2017-11-11: 17:00:00 5 [IU] via SUBCUTANEOUS
  Administered 2017-11-11 (×2): 3 [IU] via SUBCUTANEOUS
  Filled 2017-11-11 (×3): qty 1

## 2017-11-11 MED ORDER — ACETAMINOPHEN 650 MG RE SUPP: 650.0000 mg | Freq: Four times a day (QID) | RECTAL | Status: DC | PRN

## 2017-11-11 MED ORDER — WARFARIN SODIUM 10 MG PO TABS
10.0000 mg | ORAL_TABLET | Freq: Every day | ORAL | Status: DC
  Administered 2017-11-11: 10 mg via ORAL
  Filled 2017-11-11 (×2): qty 1

## 2017-11-11 MED ORDER — INSULIN ASPART 100 UNIT/ML ~~LOC~~ SOLN
0.0000 [IU] | Freq: Every day | SUBCUTANEOUS | Status: DC
  Administered 2017-11-11: 3 [IU] via SUBCUTANEOUS
  Filled 2017-11-11: qty 1

## 2017-11-11 MED ORDER — ASPIRIN EC 325 MG PO TBEC
325.0000 mg | DELAYED_RELEASE_TABLET | Freq: Every day | ORAL | Status: DC
Start: 2017-11-11 — End: 2017-11-13
  Administered 2017-11-12 – 2017-11-13 (×2): 325 mg via ORAL
  Filled 2017-11-11 (×3): qty 1

## 2017-11-11 MED ORDER — BUTALBITAL-APAP-CAFFEINE 50-325-40 MG PO TABS
0.5000 | ORAL_TABLET | Freq: Three times a day (TID) | ORAL | Status: DC | PRN
  Administered 2017-11-11 (×2): 0.5 via ORAL
  Filled 2017-11-11 (×6): qty 1

## 2017-11-11 MED ORDER — VITAMIN C 500 MG PO TABS
500.0000 mg | ORAL_TABLET | Freq: Two times a day (BID) | ORAL | Status: DC
  Administered 2017-11-11 – 2017-11-13 (×4): 500 mg via ORAL
  Filled 2017-11-11 (×6): qty 1

## 2017-11-11 MED ORDER — POLYETHYLENE GLYCOL 3350 17 G PO PACK
17.0000 g | PACK | ORAL | Status: DC
  Administered 2017-11-11 – 2017-11-13 (×2): 17 g via ORAL
  Filled 2017-11-11 (×2): qty 1

## 2017-11-11 MED ORDER — DOCUSATE SODIUM 100 MG PO CAPS
100.0000 mg | ORAL_CAPSULE | Freq: Two times a day (BID) | ORAL | Status: DC
  Administered 2017-11-11 – 2017-11-13 (×4): 100 mg via ORAL
  Filled 2017-11-11 (×5): qty 1

## 2017-11-11 MED ORDER — CALCIUM CARBONATE-VITAMIN D 500-200 MG-UNIT PO TABS
1.0000 | ORAL_TABLET | Freq: Every day | ORAL | Status: DC
  Administered 2017-11-11 – 2017-11-12 (×2): 1 via ORAL
  Filled 2017-11-11 (×2): qty 1

## 2017-11-11 MED ORDER — GADOBENATE DIMEGLUMINE 529 MG/ML IV SOLN
10.0000 mL | Freq: Once | INTRAVENOUS | Status: AC | PRN
  Administered 2017-11-11: 10 mL via INTRAVENOUS

## 2017-11-11 MED ORDER — ADULT MULTIVITAMIN W/MINERALS CH
1.0000 | ORAL_TABLET | Freq: Every evening | ORAL | Status: DC
  Administered 2017-11-11 – 2017-11-12 (×2): 1 via ORAL
  Filled 2017-11-11 (×2): qty 1

## 2017-11-11 MED ORDER — AMITRIPTYLINE HCL 25 MG PO TABS: 25.0000 mg | ORAL_TABLET | Freq: Every day | ORAL | Status: DC

## 2017-11-11 MED ORDER — ACETAMINOPHEN 325 MG PO TABS
650.0000 mg | ORAL_TABLET | Freq: Four times a day (QID) | ORAL | Status: DC | PRN
  Administered 2017-11-12: 22:00:00 650 mg via ORAL
  Filled 2017-11-11: qty 2

## 2017-11-11 MED ORDER — POLYVINYL ALCOHOL 1.4 % OP SOLN
1.0000 [drp] | OPHTHALMIC | Status: DC | PRN
  Administered 2017-11-11: 08:00:00 1 [drp] via OPHTHALMIC
  Filled 2017-11-11: qty 15

## 2017-11-11 MED ORDER — LOSARTAN POTASSIUM 50 MG PO TABS
50.0000 mg | ORAL_TABLET | Freq: Every evening | ORAL | Status: DC
  Administered 2017-11-11 – 2017-11-12 (×2): 50 mg via ORAL
  Filled 2017-11-11 (×2): qty 1

## 2017-11-11 MED ORDER — SIMVASTATIN 20 MG PO TABS
20.0000 mg | ORAL_TABLET | Freq: Every evening | ORAL | Status: DC
  Administered 2017-11-11: 17:00:00 20 mg via ORAL
  Filled 2017-11-11 (×2): qty 1

## 2017-11-11 MED ORDER — FERROUS GLUCONATE 324 (38 FE) MG PO TABS
324.0000 mg | ORAL_TABLET | Freq: Every evening | ORAL | Status: DC
  Administered 2017-11-11 – 2017-11-12 (×2): 324 mg via ORAL
  Filled 2017-11-11 (×3): qty 1

## 2017-11-11 MED ORDER — CARBOXYMETHYLCELLULOSE SODIUM 0.5 % OP SOLN: 1.0000 [drp] | OPHTHALMIC | Status: DC | PRN

## 2017-11-11 MED ORDER — POLYETHYL GLYCOL-PROPYL GLYCOL 0.4-0.3 % OP SOLN: 1.0000 [drp] | OPHTHALMIC | Status: DC | PRN

## 2017-11-11 MED ORDER — IOPAMIDOL (ISOVUE-370) INJECTION 76%
75.0000 mL | Freq: Once | INTRAVENOUS | Status: AC | PRN
  Administered 2017-11-11: 15:00:00 75 mL via INTRAVENOUS

## 2017-11-11 NOTE — Evaluation (Signed)
Physical Therapy Evaluation Patient Details Name: PIYA MESCH MRN: [id number] DOB: [birth year] Today's Date: 11/11/2017   History of Present Illness  Pt is a 72 year old female admitted from home for AMS following hyphema, confusion and a possible fall.  PMH includes vertigo, DM type 1, neuropathy, hypothyroidism and a stroke.  Clinical Impression  Pt is a 72 year old female who lives in a one story home with her husband.  She is reported by husband to be independent at baseline without use of AD.  Pt is still experiencing confusion at this time but can follow simple commands. She presents with weakness of UE and LE and is able to perform bed mobility independently.  Pt requires supervision during STS due to appearing unsteady on her feet but is able to complete on her own.  She completed 30 ft of ambulation with RW but is unable to navigate obstacles due to pt reported visual deficits related to hyphema.  Pt also appears to be affected by AMS, demonstrating difficulty with opening bathroom door and sequencing higher level gait activity.  Pt will continue to benefit from skilled PT for strength, safe use of RW, balance and fall prevention.    Follow Up Recommendations Home health PT    Equipment Recommendations  Rolling walker with 5" wheels    Recommendations for Other Services       Precautions / Restrictions Precautions Precautions: Fall Restrictions Weight Bearing Restrictions: No      Mobility  Bed Mobility Overal bed mobility: Modified Independent             General bed mobility comments: Able to perform bed mobility independently but requires increased time.  Transfers Overall transfer level: Needs assistance Equipment used: Rolling walker (2 wheeled) Transfers: Sit to/from Stand Sit to Stand: Supervision         General transfer comment: Pt is able to perform STS with physical independence but requires supervision due to confusion and impulsive  behavior.  Ambulation/Gait Ambulation/Gait assistance: Supervision Ambulation Distance (Feet): 20 Feet Assistive device: Rolling walker (2 wheeled)     Gait velocity interpretation: at or above normal speed for age/gender General Gait Details: Pt able to ambulate with RW but is unable to navigate obstacles due to visual deficits and confusion.  She maintains moderate foot clearance and maintains RW close in proximity to body.  Stairs            Wheelchair Mobility    Modified Rankin (Stroke Patients Only)       Balance Overall balance assessment: Modified Independent                                           Pertinent Vitals/Pain Pain Assessment: No/denies pain    Home Living Family/patient expects to be discharged to:: Private residence Living Arrangements: Spouse/significant other Available Help at Discharge: Family;Available 24 hours/day Type of Home: House Home Access: Stairs to enter Entrance Stairs-Rails: Can reach both Entrance Stairs-Number of Steps: 3 Home Layout: One level Home Equipment: None      Prior Function Level of Independence: Independent               Hand Dominance        Extremity/Trunk Assessment   Upper Extremity Assessment Upper Extremity Assessment: Overall WFL for tasks assessed    Lower Extremity Assessment Lower Extremity Assessment: Overall WFL for tasks  assessed    Cervical / Trunk Assessment Cervical / Trunk Assessment: Normal  Communication   Communication: No difficulties  Cognition Arousal/Alertness: Awake/alert Behavior During Therapy: WFL for tasks assessed/performed Overall Cognitive Status: Impaired/Different from baseline Area of Impairment: Orientation;Following commands;Awareness                 Orientation Level: Disoriented to;Place;Time;Situation     Following Commands: Follows one step commands inconsistently   Awareness: Anticipatory   General Comments: Pt is  able to answer some questions about home layout but is unable to figure out how to open door to restroom.      General Comments      Exercises     Assessment/Plan    PT Assessment Patient needs continued PT services  PT Problem List Decreased strength;Decreased mobility;Decreased balance;Decreased knowledge of use of DME;Decreased cognition;Decreased coordination       PT Treatment Interventions DME instruction;Functional mobility training;Balance training;Patient/family education;Gait training;Neuromuscular re-education;Therapeutic activities;Stair training;Therapeutic exercise;Cognitive remediation    PT Goals (Current goals can be found in the Care Plan section)  Acute Rehab PT Goals Patient Stated Goal: To return home PT Goal Formulation: With patient/family Time For Goal Achievement: 11/19/17 Potential to Achieve Goals: Fair    Frequency Min 2X/week   Barriers to discharge        Co-evaluation               AM-PAC PT "6 Clicks" Daily Activity  Outcome Measure Difficulty turning over in bed (including adjusting bedclothes, sheets and blankets)?: A Little Difficulty moving from lying on back to sitting on the side of the bed? : A Little Difficulty sitting down on and standing up from a chair with arms (e.g., wheelchair, bedside commode, etc,.)?: A Little Help needed moving to and from a bed to chair (including a wheelchair)?: A Little Help needed walking in hospital room?: A Little Help needed climbing 3-5 steps with a railing? : A Little 6 Click Score: 18    End of Session Equipment Utilized During Treatment: Gait belt Activity Tolerance: Patient tolerated treatment well Patient left: in bed;with call bell/phone within reach;with bed alarm set;with family/visitor present   PT Visit Diagnosis: Unsteadiness on feet (R26.81);Muscle weakness (generalized) (M62.81)    Time: 1540-1600 PT Time Calculation (min) (ACUTE ONLY): 20 min   Charges:   PT  Evaluation $PT Eval Moderate Complexity: 1 Mod     PT G Codes:   PT G-Codes **NOT FOR INPATIENT CLASS** Functional Assessment Tool Used: AM-PAC 6 Clicks Basic Mobility    Roxanne Gates, PT, DPT   Roxanne Gates 11/11/2017, 4:14 PM

## 2017-11-11 NOTE — ED Notes (Signed)
Pt and family updated on admission process. Family verbalizes understanding.

## 2017-11-11 NOTE — H&P (Signed)
Erin Daniels is an 72 y.o. female.   Chief Complaint: Confusion HPI: The patient with past medical history of diabetes type 1 with neuropathy, hypothyroidism and stroke presents to the emergency department with confusion.  The initial chief complaint was headache although the patient does not actually endorses herself (although the patient has a long history of headaches).  She very clearly has a subconjunctival hemorrhage of her left eye that she may have gotten from falling in the middle the night.  The patient was staying with her daughter at the time she sustained her injury but her husband cannot verify if there was an actual fall and the patient does not remember.  This is happened before with her other eye.  She saw her ophthalmologist a few days ago and reportedly had a hyphema at the time.  This has resolved but the patient's vision is still somewhat distorted.  CT of her head shows old stroke but no acute intracranial process.  Nonetheless, the patient still has confusion and nausea which may be concerning for concussion or subarachnoid bleed which prompted the emergency department staff to call the hospitalist service for admission.  Past Medical History:  Diagnosis Date  . At high risk for falls   . Basal cell carcinoma    right upper leg  . Complication of anesthesia    difficult to arouse  . Diabetes mellitus without complication (HCC)    Type I  . Diabetic neuropathy ([place])   . Diabetic retinopathy ([place])   . Dizziness   . Gastroparesis   . Herniated disc, cervical   . Hypercholesteremia   . Hypertension   . Hypotensive episode   . Hypothyroidism   . Insulin pump in place   . Myofascial muscle pain   . Numbness of arm    left  . Osteoporosis   . Poor vision   . Seizures ([place])   . Slurred speech   . Stroke (HCC)    x5  . Vertigo   . Wears glasses     Past Surgical History:  Procedure Laterality Date  . APPENDECTOMY    . AUGMENTATION MAMMAPLASTY Bilateral 2011   . BILATERAL CARPAL TUNNEL RELEASE    . COLONOSCOPY    . EYE SURGERY    . HEMORROIDECTOMY    . HERNIA REPAIR    . JOINT REPLACEMENT    . OPEN REDUCTION INTERNAL FIXATION (ORIF) DISTAL RADIAL FRACTURE Right 05/29/2015   Procedure: OPEN REDUCTION INTERNAL FIXATION (ORIF) RIGHT DISTAL RADIUS FRACTURE WITH ALLOGRAFT BONE GRAFTING FOR REPAIR AND RECONSTRUCTION;  Surgeon: Roseanne Kaufman, MD;  Location: [facility];  Service: Orthopedics;  Laterality: Right;  . SHOULDER ARTHROSCOPY Left   . SHOULDER HEMI-ARTHROPLASTY Left   . SYNOVECTOMY    . TONSILLECTOMY    . TRIGGER FINGER RELEASE    . TUBAL LIGATION      No family history on file. Social History:  reports that she has never smoked. She has never used smokeless tobacco. She reports that she does not drink alcohol or use drugs.  Allergies:  Allergies  Allergen Reactions  . Codeine Nausea Only    Can take Vicodin and Percocet   . Meperidine Nausea Only  . Metoclopramide Hcl Nausea Only  . Oxycodone Nausea Only  . Stadol  [Butorphanol] Nausea Only  . Tussionex Pennkinetic Er [Hydrocod Polst-Cpm Polst Er] Other (See Comments)    Filled patient with sugar   . Carbocaine  [Mepivacaine Hcl] Palpitations     (Not in a  hospital admission)  Results for orders placed or performed during the hospital encounter of 11/11/17 (from the past 48 hour(s))  CBC     Status: None   Collection Time: 11/11/17 12:40 AM  Result Value Ref Range   WBC 6.7 3.6 - 11.0 K/uL   RBC 4.23 3.80 - 5.20 MIL/uL   Hemoglobin 12.9 12.0 - 16.0 g/dL   HCT 38.6 35.0 - 47.0 %   MCV 91.3 80.0 - 100.0 fL   MCH 30.5 26.0 - 34.0 pg   MCHC 33.4 32.0 - 36.0 g/dL   RDW 12.0 11.5 - 14.5 %   Platelets 226 150 - 440 K/uL    Comment: Performed at [facility], [address] [place], [place] [address]  Comprehensive metabolic panel     Status: Abnormal   Collection Time: 11/11/17 12:40 AM  Result Value Ref Range   Sodium 143 135 - 145 mmol/L   Potassium 3.5 3.5 - 5.1  mmol/L   Chloride 106 101 - 111 mmol/L   CO2 24 22 - 32 mmol/L   Glucose, Bld 144 (H) 65 - 99 mg/dL   BUN 21 (H) 6 - 20 mg/dL   Creatinine, Ser 0.44 0.44 - 1.00 mg/dL   Calcium 9.7 8.9 - 10.3 mg/dL   Total Protein 7.1 6.5 - 8.1 g/dL   Albumin 4.2 3.5 - 5.0 g/dL   AST 26 15 - 41 U/L   ALT 19 14 - 54 U/L   Alkaline Phosphatase 72 38 - 126 U/L   Total Bilirubin 0.6 0.3 - 1.2 mg/dL   GFR calc non Af Amer >60 >60 mL/min   GFR calc Af Amer >60 >60 mL/min    Comment: (NOTE) The eGFR has been calculated using the CKD EPI equation. This calculation has not been validated in all clinical situations. eGFR's persistently <60 mL/min signify possible Chronic Kidney Disease.    Anion gap 13 5 - 15    Comment: Performed at [facility], [address] [place], [place] [address]  Troponin I     Status: None   Collection Time: 11/11/17 12:40 AM  Result Value Ref Range   Troponin I <0.03 <0.03 ng/mL    Comment: Performed at [facility], [address] [place], [place] [address]  Protime-INR     Status: Abnormal   Collection Time: 11/11/17 12:40 AM  Result Value Ref Range   Prothrombin Time 21.4 (H) 11.4 - 15.2 seconds   INR 1.87     Comment: Performed at [facility], [address] [place], [place] [address]  Urinalysis, Complete w Microscopic     Status: Abnormal   Collection Time: 11/11/17 12:49 AM  Result Value Ref Range   Color, Urine YELLOW (A) YELLOW   APPearance CLEAR (A) CLEAR   Specific Gravity, Urine 1.022 1.005 - 1.030   pH 5.0 5.0 - 8.0   Glucose, UA >=500 (A) NEGATIVE mg/dL   Hgb urine dipstick NEGATIVE NEGATIVE   Bilirubin Urine NEGATIVE NEGATIVE   Ketones, ur 20 (A) NEGATIVE mg/dL   Protein, ur NEGATIVE NEGATIVE mg/dL   Nitrite NEGATIVE NEGATIVE   Leukocytes, UA NEGATIVE NEGATIVE   RBC / HPF 0-5 0 - 5 RBC/hpf   WBC, UA 0-5 0 - 5 WBC/hpf   Bacteria, UA NONE SEEN NONE SEEN   Squamous Epithelial / LPF NONE SEEN NONE SEEN   Mucus PRESENT      Comment: Performed at [facility], [address] [place], [place]  [address]  Lactic acid, plasma     Status: None   Collection Time: 11/11/17 12:49 AM  Result Value Ref Range   Lactic Acid, Venous 1.2 0.5 - 1.9 mmol/L    Comment: Performed at [facility], [address], [place], [place] [address]   Dg Chest 2 View  Result Date: 11/11/2017 CLINICAL DATA:  Headache and nausea. EXAM: CHEST - 2 VIEW COMPARISON:  11/26/2016 FINDINGS: The heart size and mediastinal contours are within normal limits. Mild aortic atherosclerosis at the arch without aneurysm. Upper lobe predominant emphysematous hyperinflation of the lungs. No acute pulmonary consolidation or CHF. No effusion or pneumothorax. Left shoulder arthroplasty is noted. Degenerative changes are seen along the dorsal spine with osteopenia. Slight dextroconvex curvature of the lumbar spine. 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 No active pulmonary disease. Electronically Signed   By: Ashley Royalty M.D.   On: 11/11/2017 03:19   Ct Head Wo Contrast  Result Date: 11/11/2017 CLINICAL DATA:  Headache and nausea.  Confusion. EXAM: CT HEAD WITHOUT CONTRAST TECHNIQUE: Contiguous axial images were obtained from the base of the skull through the vertex without intravenous contrast. COMPARISON:  Head CT 11/26/2016 FINDINGS: Brain: No mass lesion, intraparenchymal hemorrhage or extra-axial collection. No evidence of acute cortical infarct. 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 There is periventricular hypoattenuation compatible with chronic microvascular disease. Vascular: Atherosclerotic calcification of the vertebral and internal carotid arteries at the skull base. No hyperdense vessel. Skull: Normal visualized skull base, calvarium and extracranial soft tissues. Sinuses/Orbits: No sinus fluid levels or advanced mucosal thickening. No mastoid effusion. Bilateral senile scleral calcifications.  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 Electronically Signed   By: Ulyses Jarred M.D.   On: 11/11/2017 02:09    Review of Systems  Constitutional: Negative for chills and fever.  HENT: Negative for sore throat and tinnitus.   Eyes: Negative for blurred vision and redness.  Respiratory: Negative for cough and shortness of breath.   Cardiovascular: Negative for chest pain, palpitations, orthopnea and PND.  Gastrointestinal: Positive for nausea. Negative for abdominal pain, diarrhea and vomiting.  Genitourinary: Negative for dysuria, frequency and urgency.  Musculoskeletal: Negative for joint pain and myalgias.  Skin: Negative for rash.       No lesions  Neurological: Positive for weakness. Negative for speech change and focal weakness.  Endo/Heme/Allergies: Does not bruise/bleed easily.       No temperature intolerance  Psychiatric/Behavioral: Negative for depression and suicidal ideas.    Blood pressure 139/61, pulse 81, resp. rate 14, height _0  (1.6 m), weight 45.4 kg (100 lb), SpO2 98 %. Physical Exam  Vitals reviewed. Constitutional: She is oriented to person, place, and time. She appears well-developed and well-nourished. No distress.  HENT:  Head: Normocephalic and atraumatic.  Mouth/Throat: Oropharynx is clear and moist.  Eyes: Pupils are equal, round, and reactive to light. EOM are normal. Left conjunctiva has a hemorrhage. No scleral icterus.  Reports hyphema the day after noticed eye bleeding.   Neck: Normal range of motion. Neck supple. No JVD present. No tracheal deviation present. No thyromegaly present.  Cardiovascular: Normal rate, regular rhythm and normal heart sounds. Exam reveals no gallop and no friction rub.  No murmur heard. Respiratory: Effort normal and breath sounds normal.  GI: Soft. Bowel sounds are normal. She exhibits no distension. There is no tenderness.   Genitourinary:  Genitourinary Comments: Deferred  Lymphadenopathy:    She  has no cervical adenopathy.  Neurological: She is alert and oriented to person, place, and time. No cranial nerve deficit. She exhibits normal muscle tone.  Skin: Skin is warm and dry. No rash noted. No erythema.  Psychiatric: She has a normal mood and affect. Her behavior is normal. Judgment and thought content normal.     Assessment/Plan This is a 72 year old female admitted for confusion. 1.  Confusion: No significant electrolyte abnormalities.  Check for reversible causes of altered mental status.  Differential diagnosis includes concussion.  I have ordered an MRI and neurology consult.  Not obtained a lumbar puncture at this time as the patient does not appear to have an infectious etiology to her symptoms.  However, if we decide to proceed with collection of cerebrospinal fluid the patient will have to have her Coumadin reversed. 2.  Conjunctival hemorrhage: Improving per patient.  However, if she still has active bleeding within her eye this could contribute to her distorted vision as well as nausea.  Consider ophthalmology consult 3.  Diabetes mellitus type 1: Patient usually wears an insulin pump.  Her husband has removed the pump at this time and the patient will receive sliding scale insulin only if she has eaten.  The pump also supplies basal NovoLog insulin on a varying scale 0.025-10 units/hr.  Diabetic nurse consult ordered. 4.  History of stroke: No new neurologic deficits.  The patient is on Coumadin although does not have document of atrial fibrillation.  She is unsteady on her feet and has a history of falls. 5.  Hypertension: Controlled; continue losartan 6.  Hyperlipidemia: Continue statin therapy 7.  Headaches: Continue Effexor and amitriptyline per neurologist recommendations (Dr. Cindra Eves) 8.  DVT prophylaxis: Full anticoagulation as above 9.  GI prophylaxis: None The patient is a full code.  Time  spent on admission orders and patient care approximately 45 minutes  Harrie Foreman, MD 11/11/2017, 6:21 AM

## 2017-11-11 NOTE — Progress Notes (Signed)
Pt has an insulin pump, it has been removed due to her confusion.

## 2017-11-11 NOTE — Consult Note (Signed)
Reason for Consult: confusion  Referring Physician: Dr. Bridgett Larsson   CC: Confusion   HPI: Erin Daniels is an 72 y.o. female with past medical history of diabetes type 1 with neuropathy, hypothyroidism and stroke presents to the emergency department with confusion.  She very clearly has a subconjunctival hemorrhage of her left eye that she may have gotten from falling in the middle the night.  There is no one in family that contribute to the fall.  As per sister who is at bedside yesterday pt had difficulty figuring out her medications.  Currently appears back to baseline CTH no acute abnormalities.     Past Medical History:  Diagnosis Date  . At high risk for falls   . Basal cell carcinoma    right upper leg  . Complication of anesthesia    difficult to arouse  . Diabetes mellitus without complication (HCC)    Type I  . Diabetic neuropathy ([name])   . Diabetic retinopathy ([name])   . Dizziness   . Gastroparesis   . Herniated disc, cervical   . Hypercholesteremia   . Hypertension   . Hypotensive episode   . Hypothyroidism   . Insulin pump in place   . Myofascial muscle pain   . Numbness of arm    left  . Osteoporosis   . Poor vision   . Seizures ([place])   . Slurred speech   . Stroke (HCC)    x5  . Vertigo   . Wears glasses     Past Surgical History:  Procedure Laterality Date  . APPENDECTOMY    . AUGMENTATION MAMMAPLASTY Bilateral 2011  . BILATERAL CARPAL TUNNEL RELEASE    . COLONOSCOPY    . EYE SURGERY    . HEMORROIDECTOMY    . HERNIA REPAIR    . JOINT REPLACEMENT    . OPEN REDUCTION INTERNAL FIXATION (ORIF) DISTAL RADIAL FRACTURE Right 05/29/2015   Procedure: OPEN REDUCTION INTERNAL FIXATION (ORIF) RIGHT DISTAL RADIUS FRACTURE WITH ALLOGRAFT BONE GRAFTING FOR REPAIR AND RECONSTRUCTION;  Surgeon: Roseanne Kaufman, MD;  Location: [facility];  Service: Orthopedics;  Laterality: Right;  . SHOULDER ARTHROSCOPY Left   . SHOULDER HEMI-ARTHROPLASTY Left   . SYNOVECTOMY    .  TONSILLECTOMY    . TRIGGER FINGER RELEASE    . TUBAL LIGATION      No family history on file.  Social History:  reports that she has never smoked. She has never used smokeless tobacco. She reports that she does not drink alcohol or use drugs.  Allergies  Allergen Reactions  . Codeine Nausea Only    Can take Vicodin and Percocet   . Meperidine Nausea Only  . Metoclopramide Hcl Nausea Only  . Oxycodone Nausea Only  . Stadol  [Butorphanol] Nausea Only  . Tussionex Pennkinetic Er [Hydrocod Polst-Cpm Polst Er] Other (See Comments)    Filled patient with sugar   . Carbocaine  [Mepivacaine Hcl] Palpitations    Medications: I have reviewed the patient's current medications.  ROS: History obtained from the patient  General ROS: negative for - chills, fatigue, fever, night sweats, weight gain or weight loss Psychological ROS: negative for - behavioral disorder, hallucinations, memory difficulties, mood swings or suicidal ideation Ophthalmic ROS: negative for - blurry vision, double vision, eye pain or loss of vision ENT ROS: negative for - epistaxis, nasal discharge, oral lesions, sore throat, tinnitus or vertigo Allergy and Immunology ROS: negative for - hives or itchy/watery eyes Hematological and Lymphatic ROS: negative for -  bleeding problems, bruising or swollen lymph nodes Endocrine ROS: negative for - galactorrhea, hair pattern changes, polydipsia/polyuria or temperature intolerance Respiratory ROS: negative for - cough, hemoptysis, shortness of breath or wheezing Cardiovascular ROS: negative for - chest pain, dyspnea on exertion, edema or irregular heartbeat Gastrointestinal ROS: negative for - abdominal pain, diarrhea, hematemesis, nausea/vomiting or stool incontinence Genito-Urinary ROS: negative for - dysuria, hematuria, incontinence or urinary frequency/urgency Musculoskeletal ROS: negative for - joint swelling or muscular weakness Neurological ROS: as noted in  HPI Dermatological ROS: negative for rash and skin lesion changes  Physical Examination: Blood pressure (!) 132/57, pulse 91, temperature 97.7 F (36.5 C), temperature source Oral, resp. rate 18, height 5\' 2"  (1.575 m), weight 110 lb (49.9 kg), SpO2 98 %.   Neurological Examination   Mental Status: Alert, oriented, thought content appropriate.  Speech fluent without evidence of aphasia.  Able to follow 3 step commands without difficulty. Cranial Nerves: II: Discs flat bilaterally; Visual fields grossly normal, pupils equal, round, reactive to light and accommodation III,IV, VI: ptosis not present, extra-ocular motions intact bilaterally V,VII: smile symmetric, facial light touch sensation normal bilaterally VIII: hearing normal bilaterally IX,X: gag reflex present XI: bilateral shoulder shrug XII: midline tongue extension Motor: Right : Upper extremity   5/5    Left:     Upper extremity   5/5  Lower extremity   5/5     Lower extremity   5/5 Tone and bulk:normal tone throughout; no atrophy noted Sensory: Pinprick and light touch intact throughout, bilaterally Deep Tendon Reflexes: 1+ and symmetric throughout Plantars: Right: downgoing   Left: downgoing Cerebellar: normal finger-to-nose, normal rapid alternating movements and normal heel-to-shin test Gait: not tested    Laboratory Studies:   Basic Metabolic Panel: Recent Labs  Lab 11/11/17 0040  NA 143  K 3.5  CL 106  CO2 24  GLUCOSE 144*  BUN 21*  CREATININE 0.44  CALCIUM 9.7    Liver Function Tests: Recent Labs  Lab 11/11/17 0040  AST 26  ALT 19  ALKPHOS 72  BILITOT 0.6  PROT 7.1  ALBUMIN 4.2   No results for input(s): LIPASE, AMYLASE in the last 168 hours. No results for input(s): AMMONIA in the last 168 hours.  CBC: Recent Labs  Lab 11/11/17 0040  WBC 6.7  HGB 12.9  HCT 38.6  MCV 91.3  PLT 226    Cardiac Enzymes: Recent Labs  Lab 11/11/17 0040  TROPONINI <0.03    BNP: Invalid  input(s): POCBNP  CBG: Recent Labs  Lab 11/11/17 0743 11/11/17 1200  GLUCAP 219* 245*    Microbiology: Results for orders placed or performed during the hospital encounter of 11/26/16  Blood Culture (routine x 2)     Status: None   Collection Time: 11/26/16  8:11 PM  Result Value Ref Range Status   Specimen Description BLOOD LEFT ANTECUBITAL  Final   Special Requests IN PEDIATRIC BOTTLE Blood Culture adequate volume  Final   Culture NO GROWTH 5 DAYS  Final   Report Status 12/01/2016 FINAL  Final  Blood Culture (routine x 2)     Status: None   Collection Time: 11/26/16  8:39 PM  Result Value Ref Range Status   Specimen Description BLOOD RIGHT ARM  Final   Special Requests   Final    AEROBIC BOTTLE ONLY Blood Culture results may not be optimal due to an inadequate volume of blood received in culture bottles   Culture NO GROWTH 5 DAYS  Final  Report Status 12/01/2016 FINAL  Final  MRSA PCR Screening     Status: None   Collection Time: 11/27/16  5:07 PM  Result Value Ref Range Status   MRSA by PCR NEGATIVE NEGATIVE Final    Comment:        The GeneXpert MRSA Assay (FDA approved for NASAL specimens only), is one component of a comprehensive MRSA colonization surveillance program. It is not intended to diagnose MRSA infection nor to guide or monitor treatment for MRSA infections.     Coagulation Studies: Recent Labs    11/11/17 0040  LABPROT 21.4*  INR 1.87    Urinalysis:  Recent Labs  Lab 11/11/17 0049  COLORURINE YELLOW*  LABSPEC 1.022  PHURINE 5.0  GLUCOSEU >=500*  HGBUR NEGATIVE  BILIRUBINUR NEGATIVE  KETONESUR 20*  PROTEINUR NEGATIVE  NITRITE NEGATIVE  LEUKOCYTESUR NEGATIVE    Lipid Panel:  No results found for: CHOL, TRIG, HDL, CHOLHDL, VLDL, LDLCALC  HgbA1C:  Lab Results  Component Value Date   HGBA1C 6.1 (H) 11/11/2017    Urine Drug Screen:  No results found for: LABOPIA, COCAINSCRNUR, LABBENZ, AMPHETMU, THCU, LABBARB  Alcohol Level: No  results for input(s): ETH in the last 168 hours.  Imaging: Dg Chest 2 View  Result Date: 11/11/2017 CLINICAL DATA:  Headache and nausea. EXAM: CHEST - 2 VIEW COMPARISON:  11/26/2016 FINDINGS: The heart size and mediastinal contours are within normal limits. Mild aortic atherosclerosis at the arch without aneurysm. Upper lobe predominant emphysematous hyperinflation of the lungs. No acute pulmonary consolidation or CHF. No effusion or pneumothorax. Left shoulder arthroplasty is noted. Degenerative changes are seen along the dorsal spine with osteopenia. Slight dextroconvex curvature of the lumbar spine. 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 No active pulmonary disease. Electronically Signed   By: Ashley Royalty M.D.   On: 11/11/2017 03:19   Ct Head Wo Contrast  Result Date: 11/11/2017 CLINICAL DATA:  Headache and nausea.  Confusion. EXAM: CT HEAD WITHOUT CONTRAST TECHNIQUE: Contiguous axial images were obtained from the base of the skull through the vertex without intravenous contrast. COMPARISON:  Head CT 11/26/2016 FINDINGS: Brain: No mass lesion, intraparenchymal hemorrhage or extra-axial collection. No evidence of acute cortical infarct. 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 There is periventricular hypoattenuation compatible with chronic microvascular disease. Vascular: Atherosclerotic calcification of the vertebral and internal carotid arteries at the skull base. No hyperdense vessel. Skull: Normal visualized skull base, calvarium and extracranial soft tissues. Sinuses/Orbits: No sinus fluid levels or advanced mucosal thickening. No mastoid effusion. Bilateral senile scleral calcifications. 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 Electronically Signed   By: Ulyses Jarred M.D.   On: 11/11/2017 02:09   Mr Jeri Cos DJ Contrast  Result Date: 11/11/2017 CLINICAL DATA:  72 y/o F;  few days of confusion and altered mental status. EXAM: MRI HEAD WITHOUT AND WITH CONTRAST TECHNIQUE: Multiplanar, multiecho pulse sequences of the brain and surrounding structures were obtained without and with intravenous contrast. CONTRAST:  2mL MULTIHANCE GADOBENATE DIMEGLUMINE 529 MG/ML IV SOLN COMPARISON:  11/11/2017 CT head.  01/22/2010 MRI head. FINDINGS: Brain: New small foci of diffusion hyperintensity of present with right frontal subcortical white matter, right frontal parietal centrum semiovale, and left posterior frontal subcortical white matter with intermediate ADC likely representing early subacute infarctions. No focus of reduced diffusion is present to indicate acute or early subacute infarction. No abnormal susceptibility hypointensity to indicate intracranial hemorrhage. No focal mass effect. No  abnormal enhancement. Multiple very small chronic infarcts in the cerebellum, chronic infarct within the pons, lacunar infarcts in bilateral thalami and left lentiform nucleus extending into corona radiata are stable. Background of moderate chronic microvascular ischemic changes of the brain and parenchymal volume loss is stable. Vascular: Normal flow voids. Skull and upper cervical spine: Normal marrow signal. Sinuses/Orbits: Negative. Other: None. IMPRESSION: 1. Small early subacute infarctions in the bilateral frontal and right frontoparietal white matter. No associated hemorrhage or mass effect. 2. Stable background of moderate chronic microvascular ischemic changes, parenchymal volume loss of the brain, and multiple small chronic infarctions in the basal ganglia, brainstem, and cerebellum. Electronically Signed   By: Kristine Garbe M.D.   On: 11/11/2017 13:53     Assessment/Plan:  72 y.o. female with past medical history of diabetes type 1 with neuropathy, hypothyroidism and stroke presents to the emergency department with confusion.  She very clearly has a subconjunctival hemorrhage  of her left eye that she may have gotten from falling in the middle the night.  There is no one in family that contribute to the fall.  As per sister who is at bedside yesterday pt had difficulty figuring out her medications.  Currently appears back to baseline CTH no acute abnormalities.    Transient period of confusion.  Appears to have improved. Electrolytes appear to be within normal range.   Possibly transient global amnesia.   MRI done shows bilateral fronto parietal infarcts.  This could be watershed related.   Creat is WNL.  CT angiogram head and neck ordered Pt is on ASA 325 daily.   Will follow.    11/11/2017, 1:59 PM

## 2017-11-11 NOTE — Progress Notes (Addendum)
Patient is more awake. Her vital sign is stable.  MRI of the brain shows Small early subacute infarctions in the bilateral frontal and right frontoparietal white matter.  Continue ASA 325 daily and get a CT angiogram of the head and neck per Dr. Irish Elders. PT evaluation. Discussed with the patient and nurse.  Time spent about 22 minutes.

## 2017-11-11 NOTE — ED Triage Notes (Signed)
Pt to the er for headache that began today with nausea. Pt unable to take daily meds. Confusion.

## 2017-11-11 NOTE — ED Notes (Signed)
Family wanting to leave.

## 2017-11-11 NOTE — ED Notes (Signed)
teleneurology cart placed in pt's room. Pt awake, pt educated on cart and need for neurology consult.

## 2017-11-11 NOTE — ED Notes (Signed)
Report received from sherrie, rn.

## 2017-11-11 NOTE — ED Provider Notes (Signed)
[facility] Emergency Department Provider Note   ____________________________________________   First MD Initiated Contact with Patient 11/11/17 0017     (approximate)  I have reviewed the triage vital signs and the nursing notes.   HISTORY  Chief Complaint Headache  History is limited as the patient is confused.  HPI Erin Daniels is a 72 y.o. female who comes into the hospital today with some confusion and altered mental status.  The patient according to EMS was having a headache and some nausea.  When I asked the patient when the headache started the patient states that I do not know.  When asked if she has a history of headaches the patient states that I do not know.  According to the patient's husband she has been spaced out and not acting herself for the past 2 days.  He reports that he cannot get her to do anything.  He went into the room tonight and noticed her pajama bottoms on the floor and some underwear in the sink with green feces.  The patient went to visit some family earlier this week and return with some bleeding to her eye.  They are unsure if she hit her head or if she had any injury but they said that she has not been acting herself since she returned home.  She has been complaining to her husband today that she is sick and he states that she seems confused.  The patient denies any fall when she was visiting her family.  The patient's husband states that her confusion and disorientation has been getting progressively worse and worse over the last few days.  She has not had any fevers, chest pain, cough, nausea, vomiting, diarrhea.  He was concerned so he brought her here for evaluation.  The family states that the patient was acting as though she did not know how to use her insulin pump although she is been using it for multiple years.   Past Medical History:  Diagnosis Date  . At high risk for falls   . Basal cell carcinoma    right upper  leg  . Complication of anesthesia    difficult to arouse  . Diabetes mellitus without complication (HCC)    Type I  . Diabetic neuropathy ([place])   . Diabetic retinopathy ([place])   . Dizziness   . Gastroparesis   . Herniated disc, cervical   . Hypercholesteremia   . Hypertension   . Hypotensive episode   . Hypothyroidism   . Insulin pump in place   . Myofascial muscle pain   . Numbness of arm    left  . Osteoporosis   . Poor vision   . Seizures ([place])   . Slurred speech   . Stroke (HCC)    x5  . Vertigo   . Wears glasses     Patient Active Problem List   Diagnosis Date Noted  . Confusion 11/11/2017  . Hypoglycemia 11/26/2016  . Hypokalemia   . Hypothermia   . Lactic acid acidosis   . Sepsis ([place])   . Type 1 diabetes mellitus with complication ([place])   . Hyponatremia   . Essential hypertension   . Hyperlipidemia   . Depression   . Right radial fracture 05/29/2015    Past Surgical History:  Procedure Laterality Date  . APPENDECTOMY    . AUGMENTATION MAMMAPLASTY Bilateral 2011  . BILATERAL CARPAL TUNNEL RELEASE    . COLONOSCOPY    . EYE SURGERY    .  HEMORROIDECTOMY    . HERNIA REPAIR    . JOINT REPLACEMENT    . OPEN REDUCTION INTERNAL FIXATION (ORIF) DISTAL RADIAL FRACTURE Right 05/29/2015   Procedure: OPEN REDUCTION INTERNAL FIXATION (ORIF) RIGHT DISTAL RADIUS FRACTURE WITH ALLOGRAFT BONE GRAFTING FOR REPAIR AND RECONSTRUCTION;  Surgeon: Roseanne Kaufman, MD;  Location: [facility];  Service: Orthopedics;  Laterality: Right;  . SHOULDER ARTHROSCOPY Left   . SHOULDER HEMI-ARTHROPLASTY Left   . SYNOVECTOMY    . TONSILLECTOMY    . TRIGGER FINGER RELEASE    . TUBAL LIGATION      Prior to Admission medications   Medication Sig Start Date End Date Taking? Authorizing Provider  amitriptyline (ELAVIL) 25 MG tablet Take 1 tablet (25 mg total) by mouth at bedtime. 10/30/17  Yes Gillis Santa, MD  aspirin 325 MG EC tablet Take 325 mg by mouth daily with breakfast.   Yes  [provider]  butalbital-acetaminophen-caffeine (FIORICET, ESGIC) 50-325-40 MG tablet Take 0.5-1 tablets by mouth 2 (two) times daily as needed for headache. Patient taking differently: Take 0.5 tablets by mouth 3 (three) times daily as needed for headache.  10/30/17  Yes Gillis Santa, MD  Calcium Carb-Cholecalciferol (CALCIUM 600 + D PO) Take 1 tablet by mouth every evening.   Yes [provider]  carboxymethylcellulose (REFRESH PLUS) 0.5 % SOLN Take 1-2 drops by mouth as needed (dry eyes).    Yes [provider]  glucose 4 GM chewable tablet Chew 1 tablet by mouth as needed for low blood sugar.   Yes [provider]  Insulin Human (INSULIN PUMP) SOLN Inject 0.4-4 each into the skin continuous. Uses Novolog. ( pt states she takes 20 units cont through out day)   Yes [provider]  IRON, FERROUS GLUCONATE, PO Take 1 tablet by mouth every evening.   Yes [provider]  losartan (COZAAR) 100 MG tablet Take 50 mg by mouth every evening.    Yes [provider]  Multiple Vitamin (MULTIVITAMIN WITH MINERALS) TABS tablet Take 1 tablet by mouth every evening.   Yes [provider]  Polyethyl Glycol-Propyl Glycol (SYSTANE) 0.4-0.3 % SOLN Apply 1 drop to eye as needed (dry eyes).   Yes [provider]  polyethylene glycol (MIRALAX / GLYCOLAX) packet Take 17 g by mouth every other day.   Yes [provider]  promethazine (PHENERGAN) 25 MG suppository Place 25 mg rectally every 6 (six) hours as needed for nausea or vomiting.   Yes [provider]  simvastatin (ZOCOR) 20 MG tablet Take 20 mg by mouth every evening.   Yes [provider]  venlafaxine (EFFEXOR) 75 MG tablet Take 75 mg by mouth 2 (two) times daily.   Yes [provider]  vitamin C (ASCORBIC ACID) 500 MG tablet Take 500 mg by mouth 2 (two) times daily.   Yes [provider]  warfarin (COUMADIN) 5 MG tablet Take 10 mg by  mouth daily at 6 PM.    Yes [provider]    Allergies Codeine; Meperidine; Metoclopramide hcl; Oxycodone; Stadol  [butorphanol]; Tussionex pennkinetic er [employer] polst-cpm polst er]; and Carbocaine  [mepivacaine hcl]  No family history on file.  Social History Social History   Tobacco Use  . Smoking status: Never Smoker  . Smokeless tobacco: Never Used  Substance Use Topics  . Alcohol use: No  . Drug use: No    Review of Systems  Constitutional: No fever/chills Eyes: No visual changes. ENT: No sore throat. Cardiovascular: Denies  chest pain. Respiratory: Denies shortness of breath. Gastrointestinal: No abdominal pain.  No nausea, no vomiting.  No diarrhea.  No constipation. Genitourinary: Negative for dysuria. Musculoskeletal: Negative for back pain. Skin: Negative for rash. Neurological: Headache Psych: Confusion, altered mental status  ____________________________________________   PHYSICAL EXAM:  VITAL SIGNS: ED Triage Vitals  Enc Vitals Group     BP 11/11/17 0024 (!) 161/76     Pulse Rate 11/11/17 0024 80     Resp 11/11/17 0024 18     Temp --      Temp src --      SpO2 11/11/17 0019 97 %     Weight 11/11/17 0028 100 lb (45.4 kg)     Height 11/11/17 0028 5\' 3"  (1.6 m)     Head Circumference --      Peak Flow --      Pain Score --      Pain Loc --      Pain Edu? --      Excl. in [place]? --     Constitutional: Alert and oriented to self and location but disoriented to time.  Confused appearing and in no acute distress. Eyes: Sub-conjunctival hematoma. PERRL. EOMI. Head: Atraumatic. Nose: No congestion/rhinnorhea. Mouth/Throat: Mucous membranes are moist.  Oropharynx non-erythematous. Cardiovascular: Normal rate, regular rhythm. Grossly normal heart sounds.  Good peripheral circulation. Respiratory: Normal respiratory effort.  No retractions. Lungs CTAB. Gastrointestinal: Soft and nontender. No distention.  Positive bowel sounds    Musculoskeletal: No lower extremity tenderness nor edema.   Neurologic:  Normal speech and language.  Cranial nerves II through XII are grossly intact with no focal motor neuro deficit Skin: Bruising around left eye Psychiatric: Mood and affect are normal.   ____________________________________________   LABS (all labs ordered are listed, but only abnormal results are displayed)  Labs Reviewed  COMPREHENSIVE METABOLIC PANEL - Abnormal; Notable for the following components:      Result Value   Glucose, Bld 144 (*)    BUN 21 (*)    All other components within normal limits  URINALYSIS, COMPLETE (UACMP) WITH MICROSCOPIC - Abnormal; Notable for the following components:   Color, Urine YELLOW (*)    APPearance CLEAR (*)    Glucose, UA >=500 (*)    Ketones, ur 20 (*)    All other components within normal limits  PROTIME-INR - Abnormal; Notable for the following components:   Prothrombin Time 21.4 (*)    All other components within normal limits  CBC  TROPONIN I  LACTIC ACID, PLASMA   ____________________________________________  EKG  ED ECG REPORT I, Loney Hering, the attending physician, personally viewed and interpreted this ECG.   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  Official radiology report(s): Dg Chest 2 View  Result Date: 11/11/2017 CLINICAL DATA:  Headache and nausea. EXAM: CHEST - 2 VIEW COMPARISON:  11/26/2016 FINDINGS: The heart size and mediastinal contours are within normal limits. Mild aortic atherosclerosis at the arch without aneurysm. Upper lobe  predominant emphysematous hyperinflation of the lungs. No acute pulmonary consolidation or CHF. No effusion or pneumothorax. Left shoulder arthroplasty is noted. Degenerative changes are seen along  the dorsal spine with osteopenia. Slight dextroconvex curvature of the lumbar spine. 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 No active pulmonary disease. Electronically Signed   By: Ashley Royalty M.D.   On: 11/11/2017 03:19   Ct Head Wo Contrast  Result Date: 11/11/2017 CLINICAL DATA:  Headache and nausea.  Confusion. EXAM: CT HEAD WITHOUT CONTRAST TECHNIQUE: Contiguous axial images were obtained from the base of the skull through the vertex without intravenous contrast. COMPARISON:  Head CT 11/26/2016 FINDINGS: Brain: No mass lesion, intraparenchymal hemorrhage or extra-axial collection. No evidence of acute cortical infarct. 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 There is periventricular hypoattenuation compatible with chronic microvascular disease. Vascular: Atherosclerotic calcification of the vertebral and internal carotid arteries at the skull base. No hyperdense vessel. Skull: Normal visualized skull base, calvarium and extracranial soft tissues. Sinuses/Orbits: No sinus fluid levels or advanced mucosal thickening. No mastoid effusion. Bilateral senile scleral calcifications. 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 Electronically Signed   By: Ulyses Jarred M.D.   On: 11/11/2017 02:09    ____________________________________________   PROCEDURES  Procedure(s) performed: None  Procedures  Critical Care performed: No  ____________________________________________   INITIAL IMPRESSION / ASSESSMENT AND PLAN / ED COURSE  As part of my medical decision making, I reviewed the following data within the electronic MEDICAL RECORD NUMBER Notes from prior ED visits and [place] Controlled  Substance Database   This is a 72 year old female who comes into the hospital today with some confusion and altered mental status.  It appears that the patient may have had an injury to her left eye  My differential diagnosis includes intracranial hemorrhage, urinary tract infection, electrolyte abnormality.  I did check some blood work on the patient to include a CBC CMP urinalysis lactic acid, troponin and PT/INR.  The patient's blood work is unremarkable.  I also sent the patient for CT head as well as a chest x-ray and the imaging studies were negative.  I am unsure what may be causing the patient's symptoms.  I initially contacted the hospitalist to admit the patient but they did want me to contact neurology in regards to a lumbar puncture.  I spoke to the tele-neurologist and asked if he would advise immediate lumbar puncture versus MRI and further workup especially given the fact that the patient has an INR that 1.87.  He does recommend MRI prior to lumbar puncture.  The patient will be admitted to the hospitalist service for further evaluation of her symptoms.      ____________________________________________   FINAL CLINICAL IMPRESSION(S) / ED DIAGNOSES  Final diagnoses:  Confusion  Altered mental status, unspecified altered mental status type     ED Discharge Orders    None       Note:  This document was prepared using Dragon voice recognition software and may include unintentional dictation errors.    Loney Hering, MD 11/11/17 [phone]

## 2017-11-11 NOTE — Progress Notes (Signed)
Insulin pump 

## 2017-11-12 DIAGNOSIS — Z7901 Long term (current) use of anticoagulants: Secondary | ICD-10-CM | POA: Diagnosis not present

## 2017-11-12 DIAGNOSIS — E039 Hypothyroidism, unspecified: Secondary | ICD-10-CM | POA: Diagnosis present

## 2017-11-12 DIAGNOSIS — E1110 Type 2 diabetes mellitus with ketoacidosis without coma: Secondary | ICD-10-CM | POA: Diagnosis present

## 2017-11-12 DIAGNOSIS — R410 Disorientation, unspecified: Secondary | ICD-10-CM | POA: Diagnosis present

## 2017-11-12 DIAGNOSIS — E1043 Type 1 diabetes mellitus with diabetic autonomic (poly)neuropathy: Secondary | ICD-10-CM | POA: Diagnosis present

## 2017-11-12 DIAGNOSIS — E7800 Pure hypercholesterolemia, unspecified: Secondary | ICD-10-CM | POA: Diagnosis present

## 2017-11-12 DIAGNOSIS — R51 Headache: Secondary | ICD-10-CM | POA: Diagnosis present

## 2017-11-12 DIAGNOSIS — Z888 Allergy status to other drugs, medicaments and biological substances status: Secondary | ICD-10-CM | POA: Diagnosis not present

## 2017-11-12 DIAGNOSIS — I639 Cerebral infarction, unspecified: Secondary | ICD-10-CM | POA: Diagnosis present

## 2017-11-12 DIAGNOSIS — Z885 Allergy status to narcotic agent status: Secondary | ICD-10-CM | POA: Diagnosis not present

## 2017-11-12 DIAGNOSIS — M5020 Other cervical disc displacement, unspecified cervical region: Secondary | ICD-10-CM | POA: Diagnosis present

## 2017-11-12 DIAGNOSIS — E860 Dehydration: Secondary | ICD-10-CM | POA: Diagnosis present

## 2017-11-12 DIAGNOSIS — E1010 Type 1 diabetes mellitus with ketoacidosis without coma: Secondary | ICD-10-CM | POA: Diagnosis present

## 2017-11-12 DIAGNOSIS — M810 Age-related osteoporosis without current pathological fracture: Secondary | ICD-10-CM | POA: Diagnosis present

## 2017-11-12 DIAGNOSIS — Z794 Long term (current) use of insulin: Secondary | ICD-10-CM | POA: Diagnosis not present

## 2017-11-12 DIAGNOSIS — Z7982 Long term (current) use of aspirin: Secondary | ICD-10-CM | POA: Diagnosis not present

## 2017-11-12 DIAGNOSIS — Z9181 History of falling: Secondary | ICD-10-CM | POA: Diagnosis not present

## 2017-11-12 DIAGNOSIS — Z85828 Personal history of other malignant neoplasm of skin: Secondary | ICD-10-CM | POA: Diagnosis not present

## 2017-11-12 DIAGNOSIS — H1132 Conjunctival hemorrhage, left eye: Secondary | ICD-10-CM | POA: Diagnosis present

## 2017-11-12 DIAGNOSIS — E785 Hyperlipidemia, unspecified: Secondary | ICD-10-CM | POA: Diagnosis present

## 2017-11-12 DIAGNOSIS — Z9641 Presence of insulin pump (external) (internal): Secondary | ICD-10-CM | POA: Diagnosis present

## 2017-11-12 DIAGNOSIS — H547 Unspecified visual loss: Secondary | ICD-10-CM | POA: Diagnosis present

## 2017-11-12 DIAGNOSIS — Z8673 Personal history of transient ischemic attack (TIA), and cerebral infarction without residual deficits: Secondary | ICD-10-CM | POA: Diagnosis not present

## 2017-11-12 DIAGNOSIS — Z96612 Presence of left artificial shoulder joint: Secondary | ICD-10-CM | POA: Diagnosis present

## 2017-11-12 DIAGNOSIS — E10319 Type 1 diabetes mellitus with unspecified diabetic retinopathy without macular edema: Secondary | ICD-10-CM | POA: Diagnosis present

## 2017-11-12 DIAGNOSIS — I10 Essential (primary) hypertension: Secondary | ICD-10-CM | POA: Diagnosis present

## 2017-11-12 LAB — BASIC METABOLIC PANEL
ANION GAP: 17 — AB (ref 5–15)
ANION GAP: 12 (ref 5–15)
ANION GAP: 7 (ref 5–15)
BUN: 20 mg/dL (ref 6–20)
BUN: 24 mg/dL — ABNORMAL HIGH (ref 6–20)
BUN: 21 mg/dL — ABNORMAL HIGH (ref 6–20)
CALCIUM: 9.3 mg/dL (ref 8.9–10.3)
CALCIUM: 9.2 mg/dL (ref 8.9–10.3)
CALCIUM: 9.1 mg/dL (ref 8.9–10.3)
CO2: 18 mmol/L — ABNORMAL LOW (ref 22–32)
CO2: 23 mmol/L (ref 22–32)
CO2: 26 mmol/L (ref 22–32)
Chloride: 98 mmol/L — ABNORMAL LOW (ref 101–111)
Chloride: 97 mmol/L — ABNORMAL LOW (ref 101–111)
Chloride: 103 mmol/L (ref 101–111)
Creatinine, Ser: 0.74 mg/dL (ref 0.44–1.00)
Creatinine, Ser: 0.74 mg/dL (ref 0.44–1.00)
Creatinine, Ser: 0.86 mg/dL (ref 0.44–1.00)
GFR calc non Af Amer: >60 mL/min (ref >60)
GLUCOSE: 396 mg/dL — AB (ref 65–99)
Glucose, Bld: 396 mg/dL — ABNORMAL HIGH (ref 65–99)
Glucose, Bld: 141 mg/dL — ABNORMAL HIGH (ref 65–99)
POTASSIUM: 4.5 mmol/L (ref 3.5–5.1)
POTASSIUM: 5.1 mmol/L (ref 3.5–5.1)
POTASSIUM: 4.2 mmol/L (ref 3.5–5.1)
Sodium: 133 mmol/L — ABNORMAL LOW (ref 135–145)
Sodium: 132 mmol/L — ABNORMAL LOW (ref 135–145)
Sodium: 136 mmol/L (ref 135–145)

## 2017-11-12 LAB — GLUCOSE, CAPILLARY
Glucose-Capillary: 135 mg/dL — ABNORMAL HIGH (ref 65–99)
Glucose-Capillary: 157 mg/dL — ABNORMAL HIGH (ref 65–99)
Glucose-Capillary: 159 mg/dL — ABNORMAL HIGH (ref 65–99)
Glucose-Capillary: 228 mg/dL — ABNORMAL HIGH (ref 65–99)
Glucose-Capillary: 254 mg/dL — ABNORMAL HIGH (ref 65–99)
Glucose-Capillary: 296 mg/dL — ABNORMAL HIGH (ref 65–99)
Glucose-Capillary: 313 mg/dL — ABNORMAL HIGH (ref 65–99)
Glucose-Capillary: 401 mg/dL — ABNORMAL HIGH (ref 65–99)
Glucose-Capillary: 417 mg/dL — ABNORMAL HIGH (ref 65–99)
Glucose-Capillary: 455 mg/dL — ABNORMAL HIGH (ref 65–99)

## 2017-11-12 LAB — HEMOGLOBIN A1C
HEMOGLOBIN A1C: 5.9 % — AB (ref 4.8–5.6)
MEAN PLASMA GLUCOSE: 122.63 mg/dL

## 2017-11-12 LAB — PROTIME-INR
INR: 2.5
PROTHROMBIN TIME: 26.8 s — AB (ref 11.4–15.2)

## 2017-11-12 MED ORDER — INSULIN GLARGINE 100 UNIT/ML ~~LOC~~ SOLN
5.0000 [IU] | Freq: Every day | SUBCUTANEOUS | Status: DC
  Filled 2017-11-12: qty 0.05

## 2017-11-12 MED ORDER — WARFARIN SODIUM 5 MG PO TABS
5.0000 mg | ORAL_TABLET | Freq: Once | ORAL | Status: AC
  Administered 2017-11-12: 5 mg via ORAL
  Filled 2017-11-12: qty 1

## 2017-11-12 MED ORDER — WARFARIN - PHARMACIST DOSING INPATIENT: Freq: Every day | Status: DC

## 2017-11-12 MED ORDER — INSULIN ASPART 100 UNIT/ML ~~LOC~~ SOLN
0.0000 [IU] | Freq: Three times a day (TID) | SUBCUTANEOUS | Status: DC
  Administered 2017-11-12: 09:00:00 15 [IU] via SUBCUTANEOUS

## 2017-11-12 MED ORDER — INSULIN ASPART 100 UNIT/ML ~~LOC~~ SOLN
2.0000 [IU] | Freq: Three times a day (TID) | SUBCUTANEOUS | Status: DC
  Administered 2017-11-12 – 2017-11-13 (×2): 2 [IU] via SUBCUTANEOUS
  Filled 2017-11-12 (×2): qty 1

## 2017-11-12 MED ORDER — INSULIN GLARGINE 100 UNIT/ML ~~LOC~~ SOLN
15.0000 [IU] | Freq: Every day | SUBCUTANEOUS | Status: DC
  Administered 2017-11-12: 15 [IU] via SUBCUTANEOUS
  Filled 2017-11-12: qty 0.15

## 2017-11-12 MED ORDER — INSULIN ASPART 100 UNIT/ML ~~LOC~~ SOLN
0.0000 [IU] | SUBCUTANEOUS | Status: DC
  Administered 2017-11-12: 1 [IU] via SUBCUTANEOUS
  Administered 2017-11-12: 18:00:00 3 [IU] via SUBCUTANEOUS
  Administered 2017-11-12: 5 [IU] via SUBCUTANEOUS
  Administered 2017-11-13 (×2): 2 [IU] via SUBCUTANEOUS
  Filled 2017-11-12 (×5): qty 1

## 2017-11-12 MED ORDER — INSULIN ASPART 100 UNIT/ML ~~LOC~~ SOLN: 0.0000 [IU] | Freq: Three times a day (TID) | SUBCUTANEOUS | Status: DC

## 2017-11-12 MED ORDER — ATORVASTATIN CALCIUM 20 MG PO TABS
40.0000 mg | ORAL_TABLET | Freq: Every day | ORAL | Status: DC
  Administered 2017-11-12: 40 mg via ORAL
  Filled 2017-11-12: qty 2

## 2017-11-12 MED ORDER — INSULIN ASPART 100 UNIT/ML ~~LOC~~ SOLN: 0.0000 [IU] | Freq: Every day | SUBCUTANEOUS | Status: DC

## 2017-11-12 MED ORDER — SODIUM CHLORIDE 0.9 % IV SOLN
INTRAVENOUS | Status: DC
  Administered 2017-11-12 – 2017-11-13 (×2): via INTRAVENOUS

## 2017-11-12 MED ORDER — INSULIN ASPART 100 UNIT/ML ~~LOC~~ SOLN
SUBCUTANEOUS | Status: AC
  Filled 2017-11-12: qty 2

## 2017-11-12 NOTE — Progress Notes (Signed)
Glucose up to 401 mg/dl at 13:54. Talked with Dr. Bridgett Larsson regarding current glucose. Dr. Bridgett Larsson had talked with patient and family and they expressed concern about glucose going back up and what would be done through the night if glucose was elevated. Recommended, to change frequency of CBGs and custom Novolog 0-5 units to Q4H but continue to check glucose every 2 hours to ensure patient's glucose does not become too low especially during the night. Also, recommended to order Novolog 2 units TID with meals for meal coverage if patient eats at least 50% of meal. Dr. Bridgett Larsson agreeable to recommendations and to go ahead and cover current glucose with Novolog 5 units. Talked with Gerald Stabs, RN regarding recommendations and orders. Talked with patient and her family again. Patient's husband concerned that if her glucose goes up during the night that she will not have any insulin ordered. Discussed changes made with insulin orders and explained that the Lantus 15 units she received this morning will be active for 24 hours and that Novolog is active 4-6 hours.  Patient did eat about 50% of lunch tray (spaghetti) which contributed to glucose going back up. Patient received Novolog 5 units at 14:30. Had noted in office visit note by Dr. Frederik Pear that patient was not using the bolus calculations in her insulin pump as she usually calculates the bolus herself.  Asked patient what she would do at home if her glucose was elevated (how she calculated how much insulin to take) and she stated she did not remember. Patient's husband has correspondence on his phone with the formula patient uses for carb coverage and correction. Per his calculations, 1 unit of insulin drops glucose 80 mg/dl which is the same correction factor in the insulin pump settings that were verified this morning. Explained that CBGs will be monitored every 2 hours and Novolog correction is now ordered every 4 hours if needed. Patient's husband reports that he is able to help  his wife with her insulin pump and he would prefer her to resume her insulin pump prior to discharge home. Patient's husband plans to bring new insulin pump infusion site and insulin with him to the hospital in the morning to have available to restart her pump when appropriate. Patient and her family verbalized understanding of information and state they feel better now knowing that she has Novolog correction ordered every 4 hours if needed. Will plan to see patient in the morning.   Thanks, Barnie Alderman, RN, MSN, CDE Diabetes Coordinator Inpatient Diabetes Program [phone] (Team Pager from 8am to 5pm)

## 2017-11-12 NOTE — Care Management Obs Status (Signed)
[place] NOTIFICATION   Patient Details  Name: Erin Daniels MRN: [id number] Date of Birth: 13-Jan-1946   Medicare Observation Status Notification Given:  Yes    Shelbie Ammons, RN 11/12/2017, 8:37 AM

## 2017-11-12 NOTE — Progress Notes (Signed)
ANTICOAGULATION CONSULT NOTE - Follow Up Consult  Pharmacy Consult for warfarin Indication: stroke prevention  Allergies  Allergen Reactions  . Codeine Nausea Only    Can take Vicodin and Percocet   . Meperidine Nausea Only  . Metoclopramide Hcl Nausea Only  . Oxycodone Nausea Only  . Stadol  [Butorphanol] Nausea Only  . Tussionex Pennkinetic Er [Hydrocod Polst-Cpm Polst Er] Other (See Comments)    Filled patient with sugar   . Carbocaine  [Mepivacaine Hcl] Palpitations    Patient Measurements: Height: 5\' 2"  (157.5 cm) Weight: 111 lb (50.3 kg) IBW/kg (Calculated) : 50.1  Vital Signs: Temp: 97.8 F (36.6 C) (03/25 0633) Temp Source: Oral (03/25 0633) BP: 173/66 (03/25 5929) Pulse Rate: 91 (03/25 0633)  Labs: Recent Labs    11/11/17 0040 11/12/17 0740 11/12/17 1402  HGB 12.9  --   --   HCT 38.6  --   --   PLT 226  --   --   LABPROT 21.4* 26.8*  --   INR 1.87 2.50  --   CREATININE 0.44 0.74 0.74  TROPONINI <0.03  --   --     Estimated Creatinine Clearance: 49.5 mL/min (by C-G formula based on SCr of 0.74 mg/dL).    Assessment: 72 yo female on warfarin PTA. Spoke with husband on phone who confirms that pt takes warfarin 5 mg tab- two tabs daily at dinner (=10 mg). Upon chart review, mention of subconjunctival hemorrhage; per Dr Bridgett Larsson today it is from an eye injury and is improving. MD confirms need to continue warfarin at this time.    3/24  INR 1.87  - warfarin 10 mg 3/25  INR 2.50  Goal of Therapy:  INR 2-3 - confirmed in neuro note Monitor platelets by anticoagulation protocol: Yes   Plan:  INR therapeutic today. Large increase in INR overnight. Will cut home dose in half to warfarin 5 mg PO x1 for tonight.  Follow up INR in AM.   Rocky Morel 11/12/2017,3:35 PM

## 2017-11-12 NOTE — Progress Notes (Addendum)
[facility] NAME: Erin Daniels    MR#:  [id number]  DATE OF BIRTH:  1945/12/28  SUBJECTIVE:  CHIEF COMPLAINT:   Chief Complaint  Patient presents with  . Headache    REVIEW OF SYSTEMS:  ROS  DRUG ALLERGIES:   Allergies  Allergen Reactions  . Codeine Nausea Only    Can take Vicodin and Percocet   . Meperidine Nausea Only  . Metoclopramide Hcl Nausea Only  . Oxycodone Nausea Only  . Stadol  [Butorphanol] Nausea Only  . Tussionex Pennkinetic Er [Hydrocod Polst-Cpm Polst Er] Other (See Comments)    Filled patient with sugar   . Carbocaine  [Mepivacaine Hcl] Palpitations   VITALS:  Blood pressure (!) 173/66, pulse 91, temperature 97.8 F (36.6 C), temperature source Oral, resp. rate 18, height 5\' 2"  (1.575 m), weight 111 lb (50.3 kg), SpO2 99 %. PHYSICAL EXAMINATION:  Physical Exam LABORATORY PANEL:  Female CBC Recent Labs  Lab 11/11/17 0040  WBC 6.7  HGB 12.9  HCT 38.6  PLT 226   ------------------------------------------------------------------------------------------------------------------ Chemistries  Recent Labs  Lab 11/11/17 0040  11/12/17 1402  NA 143   < > 132*  K 3.5   < > 5.1  CL 106   < > 97*  CO2 24   < > 23  GLUCOSE 144*   < > 396*  BUN 21*   < > 24*  CREATININE 0.44   < > 0.74  CALCIUM 9.7   < > 9.2  AST 26  --   --   ALT 19  --   --   ALKPHOS 72  --   --   BILITOT 0.6  --   --    < > = values in this interval not displayed.   RADIOLOGY:  No results found. ASSESSMENT AND PLAN:   This is a 72 year old female admitted for confusion. 1. Confusion due to acute CVA:  Continue Coumadin and aspirin, and statin, cognitive therapy and PT evaluation per Dr. Doy Mince.  2.  Conjunctival hemorrhage: Improving.  3.  DKA with Diabetes mellitus type 1: Patient usually wears an insulin pump.  Her husband has removed the pump at this time.The pump supplies basal NovoLog insulin on a varying  scale 0.025-10 units/hr.  Patient has hyperglycemia with blood glucose up to 455 and AG up to 17 this morning.  She was given NovoLog 15 units and Lantus 15 units, sliding scale was increased to moderate this morning.  Diabetes coordinator suggest that decreasing sliding scale to custom sliding scale and decrease Lantus to 5 unit daily from tomorrow.  The patient blood glucose decreased to 254 but later up to 401. Per Ms. Byrd, change frequency of CBGs and custom Novolog 0-5 units to Q4H but continue to check glucose every 2 hours to ensure patient's glucose does not become too low especially during the night. Also, recommended to order Novolog 2 units TID with meals for meal coverage if patient eats at least 50% of meal.  She is suggested resuming insulin pump tomorrow. BMP q6h. Repeat BMP showing AG decreased to 12.  4.  Dehydration.  Start normal saline IV and follow-up BMP.  5.  Hypertension: Controlled; continue losartan 6.  Hyperlipidemia: Continue statin therapy 7.  Headaches: Continue Effexor and amitriptyline per neurologist recommendations (Dr. Cindra Eves)  Discussed with Dr. Doy Mince and diabetes coordinator. All the records are reviewed and case discussed with Care Management/Social Worker. Management plans discussed with the  patient, her husband and sister and they are in agreement.  CODE STATUS: Full Code  TOTAL CRITICAL TIME TAKING CARE OF THIS PATIENT: 52 minutes.   More than 50% of the time was spent in counseling/coordination of care: YES  POSSIBLE D/C IN 2 DAYS, DEPENDING ON CLINICAL CONDITION.   Demetrios Loll M.D on 11/12/2017 at 4:20 PM  Between 7am to 6pm - Pager - [phone]  After 6pm go to [email or web address] - [facility]

## 2017-11-12 NOTE — Progress Notes (Signed)
Inpatient Diabetes Program Recommendations  AACE/ADA: New Consensus Statement on Inpatient Glycemic Control (2015)  Target Ranges:  Prepandial:   less than 140 mg/dL      Peak postprandial:   less than 180 mg/dL (1-2 hours)      Critically ill patients:  140 - 180 mg/dL   Results for Erin Daniels, Erin Daniels (MRN [id number]) as of 11/12/2017 09:57  Ref. Range 11/11/2017 07:43 11/11/2017 12:00 11/11/2017 16:47 11/11/2017 21:27 11/12/2017 02:57 11/12/2017 08:10 11/12/2017 08:11  Glucose-Capillary Latest Ref Range: 65 - 99 mg/dL 219 (H) 245 (H) 289 (H) 260 (H) 313 (H) 417 (H) 455 (H)   Review of Glycemic Control  Diabetes history: DM1 (makes NO insulin; requires insulin for basal, correction and meal coverage) Outpatient Diabetes medications: Animas Ping Insulin pump with Novolog Current orders for Inpatient glycemic control: Lantus 15 units daily, Novolog 0-15 units TID with meals, Novolog 0-5 units QHS  Inpatient Diabetes Program Recommendations:  Labs: BMET at 7:40 am today with CO2 18 and AG 17 and glucose 396 mg/dl which are indicative to DKA. However, patient has already received Novolog 15 units at 8:30 am and Lantus 15 units at 8:47 am and glucose is dropping rapidly (droped 100 mg/dl in the past 30 minutes). Now concerned about hypoglycemia due to excessive amount of insulin patient has been given this morning. Insulin - Basal:  Please decrease Lantus to 5 units daily (to start 11/13/17). Correction (SSI): Fasting glucose 455 mg/dl this morning and patient received Novolog 15 units at 8:30 am today. Please decrease Novolog correction to custom scale ACHS. -Custom Novolog correction scale 0-5 units       151-200  1 unit      201-250  2 units      251-300  3 units      301-350  4 units      351-400  5 units  HgbA1C: A1C 6.1% on 11/11/17 indicating an average glucose of 128 mg/dl.  NOTE: Durward Mallard, RN at 8:50 am and he reports that patient has already received Lantus 15 units. Discussed concern  regarding DKA and concern now for hypoglycemia since patient received Novolog 15 units at 8:30 am and Lantus 15 units at 8:47 am. Per office visit note by Dr. Frederik Pear (patient's Endocrinologist), 1 unit of Novolog drops glucose 80 mg/dl and total basal in 24 hours was 7.45 units. Talked with patient, her husband, and her sister. Expressed concern about possibility of DKA due to insulin pump being removed on 11/11/17 and no basal insulin given then and going 10 hours without any insulin. Patient stated that she asked the nurse last night to check her glucose during the night and it was noted to be 313 mg/dl at 2:57 am but no insulin was given for correction (since none ordered and per chart, no note that RN notified MD of CBG). Glucose up to 455 mg/dl at 8:11 am and patient was given Novolog 15 units and Lantus 15 units this morning already. Discussed concern now of hypoglycemia since patient received so much insulin and she is very sensitive to insulin. Reviewed insulin pump settings which are: Basal: 6.98 units/24 hours I:CR    12am 1:25 grams            7am 1:11 grams  10am 1:20 grams  4:30pm 1:25 grams I:SF  1:80 mg/dl (1 unit drops glucose 80 mg/dl)  Patient's husband rechecked glucose while in room and it was down to 344 mg/dl (within 30 mins from last finger stick  he done). Encouraged patient to eat and drink to prevent hypoglycemia. Patient reported she was nauseous and did not feel she could eat but did eat some apple sauce and drink orange juice. Provided patient with regular ginger ale, graham crackers, saltine crackers and peanut butter to have a bedside to drink and eat throughout the day. Patient reported that she does have some issues with hypoglycemia at home still and that she can no longer feel symptoms of hypoglycemia. Discussed hypoglycemia unawareness and explained that she is likely experiencing frequent hypoglycemia as an outpatient. Patient reports that she targets her glucose to be 70  mg/dl and she tries not to let her glucose get above 100 mg/dl usually. Explained that is likely too low of a target and she most likely needs to target around 90-100 mg/dl to prevent hypoglycemia incidents. Encouraged patient to discuss further with her Endocrinologist.  Patient's husband states he will check her glucose every 30 mins to 1 hour with personal glucometer and make nursing staff aware if her glucose gets too low.  Discussed conversation with Gerald Stabs, RN and asked that he watch patient closely and provided regular sodas and snacks through out the day to prevent hypoglycemia. Discussed with Dr. Bridgett Larsson as well. Plan will be for patient to resume her insulin pump before discharge. Will likely stay inpatient today and may discharge tomorrow. Will continue to follow and assist with transitioning back to her insulin pump tomorrow if MD orders at that time.   Thanks, Barnie Alderman, RN, MSN, CDE Diabetes Coordinator Inpatient Diabetes Program [phone] (Team Pager from 8am to 5pm)

## 2017-11-12 NOTE — Progress Notes (Signed)
Received call from [place], [place] regarding patient's current glucose of 254 mg/dl at 12:00. Recommend not giving any additional Novolog correction at this time since last dose of Novolog could still be active and Lantus dose will be active for the next 24 hours. Sent text page to Dr. Bridgett Larsson regarding recommendation to not give any Novolog at this time.   Thanks, Barnie Alderman, RN, MSN, CDE Diabetes Coordinator Inpatient Diabetes Program [phone] (Team Pager from 8am to 5pm)

## 2017-11-12 NOTE — Progress Notes (Addendum)
Subjective: Patient improved with her confusion but still not at baseline.  Per family unable to manage her diabetes.    Objective: Current vital signs: BP (!) 173/66 (BP Location: Right Arm)   Pulse 91   Temp 97.8 F (36.6 C) (Oral)   Resp 18   Ht 5\' 2"  (1.575 m)   Wt 50.3 kg (111 lb)   SpO2 99%   BMI 20.30 kg/m  Vital signs in last 24 hours: Temp:  [97.7 F (36.5 C)-98.2 F (36.8 C)] 97.8 F (36.6 C) (03/25 3785) Pulse Rate:  [91] 91 (03/25 0633) Resp:  [18] 18 (03/25 0633) BP: (132-173)/(57-66) 173/66 (03/25 0633) SpO2:  [98 %-100 %] 99 % (03/25 0633) Weight:  [50.3 kg (111 lb)] 50.3 kg (111 lb) (03/25 0500)  Intake/Output from previous day: No intake/output data recorded. Intake/Output this shift: No intake/output data recorded. Nutritional status: Diet heart healthy/carb modified Room service appropriate? Yes; Fluid consistency: Thin  Neurologic Exam: Alert.  Confused at times.  Speech fluent without evidence of aphasia.  Able to follow 3 step commands without difficulty. Cranial Nerves: II: Discs flat bilaterally; Visual fields grossly normal, pupils equal, round, reactive to light and accommodation III,IV, VI: ptosis not present, extra-ocular motions intact bilaterally V,VII: smile symmetric, facial light touch sensation normal bilaterally VIII: hearing normal bilaterally IX,X: gag reflex present XI: bilateral shoulder shrug XII: midline tongue extension Motor: Right :  Upper extremity   5/5                                      Left:     Upper extremity   5/5             Lower extremity   5/5                                                  Lower extremity   5/5 Tone and bulk:normal tone throughout; no atrophy noted Sensory: Pinprick and light touch intact throughout, bilaterally  Lab Results: Basic Metabolic Panel: Recent Labs  Lab 11/11/17 0040 11/12/17 0740  NA 143 133*  K 3.5 4.5  CL 106 98*  CO2 24 18*  GLUCOSE 144* 396*  BUN 21* 20  CREATININE  0.44 0.74  CALCIUM 9.7 9.3    Liver Function Tests: Recent Labs  Lab 11/11/17 0040  AST 26  ALT 19  ALKPHOS 72  BILITOT 0.6  PROT 7.1  ALBUMIN 4.2   No results for input(s): LIPASE, AMYLASE in the last 168 hours. No results for input(s): AMMONIA in the last 168 hours.  CBC: Recent Labs  Lab 11/11/17 0040  WBC 6.7  HGB 12.9  HCT 38.6  MCV 91.3  PLT 226    Cardiac Enzymes: Recent Labs  Lab 11/11/17 0040  TROPONINI <0.03    Lipid Panel: No results for input(s): CHOL, TRIG, HDL, CHOLHDL, VLDL, LDLCALC in the last 168 hours.  CBG: Recent Labs  Lab 11/11/17 2127 11/12/17 0257 11/12/17 0810 11/12/17 0811 11/12/17 1200  GLUCAP 260* 313* 417* 11* 254*    Microbiology: Results for orders placed or performed during the hospital encounter of 11/26/16  Blood Culture (routine x 2)     Status: None   Collection Time: 11/26/16  8:11 PM  Result Value  Ref Range Status   Specimen Description BLOOD LEFT ANTECUBITAL  Final   Special Requests IN PEDIATRIC BOTTLE Blood Culture adequate volume  Final   Culture NO GROWTH 5 DAYS  Final   Report Status 12/01/2016 FINAL  Final  Blood Culture (routine x 2)     Status: None   Collection Time: 11/26/16  8:39 PM  Result Value Ref Range Status   Specimen Description BLOOD RIGHT ARM  Final   Special Requests   Final    AEROBIC BOTTLE ONLY Blood Culture results may not be optimal due to an inadequate volume of blood received in culture bottles   Culture NO GROWTH 5 DAYS  Final   Report Status 12/01/2016 FINAL  Final  MRSA PCR Screening     Status: None   Collection Time: 11/27/16  5:07 PM  Result Value Ref Range Status   MRSA by PCR NEGATIVE NEGATIVE Final    Comment:        The GeneXpert MRSA Assay (FDA approved for NASAL specimens only), is one component of a comprehensive MRSA colonization surveillance program. It is not intended to diagnose MRSA infection nor to guide or monitor treatment for MRSA infections.      Coagulation Studies: Recent Labs    11/11/17 0040 11/12/17 0740  LABPROT 21.4* 26.8*  INR 1.87 2.50    Imaging: Ct Angio Head W Or Wo Contrast  Result Date: 11/11/2017 CLINICAL DATA:  72 y/o  F; stroke for follow-up. EXAM: CT ANGIOGRAPHY HEAD AND NECK TECHNIQUE: Multidetector CT imaging of the head and neck was performed using the standard protocol during bolus administration of intravenous contrast. Multiplanar CT image reconstructions and MIPs were obtained to evaluate the vascular anatomy. Carotid stenosis measurements (when applicable) are obtained utilizing NASCET criteria, using the distal internal carotid diameter as the denominator. CONTRAST:  43mL ISOVUE-370 IOPAMIDOL (ISOVUE-370) INJECTION 76% COMPARISON:  11/11/2017 CT and MRI of the head. 01/21/2010 carotid Doppler. 12/10/2003 MRA of the head. 05/25/2015 cervical spine CT. FINDINGS: CTA NECK FINDINGS Aortic arch: Standard branching. Imaged portion shows no evidence of aneurysm or dissection. No significant stenosis of the major arch vessel origins. Mild calcific atherosclerosis. Right carotid system: No evidence of dissection, stenosis (50% or greater) or occlusion. Calcific atherosclerosis of the carotid bifurcation with mild less than 50% proximal ICA stenosis. Left carotid system: No evidence of dissection, stenosis (50% or greater) or occlusion. Calcific atherosclerosis of carotid bifurcation without significant stenosis. Vertebral arteries: Left dominant. No evidence of dissection, stenosis (50% or greater) or occlusion. Skeleton: C3-4 grade 1 anterolisthesis and advanced multilevel cervical discogenic degenerative changes. Mild-to-moderate C4 level canal stenosis. Right C3-C6 and left C4-C6 uncovertebral hypertrophy with bony foraminal encroachment. Other neck: 5 mm nodule in right lobe of thyroid. Bilateral breast prosthesis. Upper chest: Right axillary, subpectoral, and supraclavicular lymphadenopathy with representative nodes  as follows: Supraclavicular 12 x 11 mm (series 4 image 41), subpectoral 20 x 14 mm (series 4, image 30), and right axillary 17 x 13 mm (series 4, image 9). Review of the MIP images confirms the above findings CTA HEAD FINDINGS Anterior circulation: Extensive calcific atherosclerosis of carotid siphons with moderate to severe right paraclinoid stenosis. No large vessel occlusion, aneurysm, or additional segment of high-grade stenosis. Posterior circulation: No significant stenosis, proximal occlusion, aneurysm, or vascular malformation. Venous sinuses: As permitted by contrast timing, patent. Anatomic variants: Left posterior communicating artery. No right posterior communicating artery identified, likely hypoplastic or absent. Large left A1, large anterior communicating artery, no appreciable right  A1, normal variant. Partial azygos A2. Delayed phase: No abnormal intracranial enhancement. Review of the MIP images confirms the above findings IMPRESSION: CTA neck: 1. 1. Patent carotid and vertebral arteries of the neck. No dissection, aneurysm, or hemodynamically significant stenosis utilizing NASCET criteria. 2. Right axillary, subpectoral, and supraclavicular nonspecific lymphadenopathy which may be due to metastasis, lymphoproliferative disease, or reactive inflammation. Clinical correlation recommended. CTA head: 1. 1. Calcific atherosclerosis of carotid siphons with moderate to severe right paraclinoid stenosis. 2. Otherwise no large vessel occlusion, aneurysm, or significant stenosis. Electronically Signed   By: Kristine Garbe M.D.   On: 11/11/2017 15:40   Dg Chest 2 View  Result Date: 11/11/2017 CLINICAL DATA:  Headache and nausea. EXAM: CHEST - 2 VIEW COMPARISON:  11/26/2016 FINDINGS: The heart size and mediastinal contours are within normal limits. Mild aortic atherosclerosis at the arch without aneurysm. Upper lobe predominant emphysematous hyperinflation of the lungs. No acute pulmonary  consolidation or CHF. No effusion or pneumothorax. Left shoulder arthroplasty is noted. Degenerative changes are seen along the dorsal spine with osteopenia. Slight dextroconvex curvature of the lumbar spine. 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 No active pulmonary disease. Electronically Signed   By: Ashley Royalty M.D.   On: 11/11/2017 03:19   Ct Head Wo Contrast  Result Date: 11/11/2017 CLINICAL DATA:  Headache and nausea.  Confusion. EXAM: CT HEAD WITHOUT CONTRAST TECHNIQUE: Contiguous axial images were obtained from the base of the skull through the vertex without intravenous contrast. COMPARISON:  Head CT 11/26/2016 FINDINGS: Brain: No mass lesion, intraparenchymal hemorrhage or extra-axial collection. No evidence of acute cortical infarct. 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 There is periventricular hypoattenuation compatible with chronic microvascular disease. Vascular: Atherosclerotic calcification of the vertebral and internal carotid arteries at the skull base. No hyperdense vessel. Skull: Normal visualized skull base, calvarium and extracranial soft tissues. Sinuses/Orbits: No sinus fluid levels or advanced mucosal thickening. No mastoid effusion. Bilateral senile scleral calcifications. 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 Electronically Signed   By: Ulyses Jarred M.D.   On: 11/11/2017 02:09   Ct Angio Neck W Or Wo Contrast  Result Date: 11/11/2017 CLINICAL DATA:  72 y/o  F; stroke for follow-up. EXAM: CT ANGIOGRAPHY HEAD AND NECK TECHNIQUE: Multidetector CT imaging of the head and neck was performed using the standard protocol during bolus administration of intravenous contrast. Multiplanar CT image reconstructions and MIPs were obtained to evaluate the vascular anatomy. Carotid stenosis measurements (when applicable) are obtained utilizing NASCET criteria, using  the distal internal carotid diameter as the denominator. CONTRAST:  25mL ISOVUE-370 IOPAMIDOL (ISOVUE-370) INJECTION 76% COMPARISON:  11/11/2017 CT and MRI of the head. 01/21/2010 carotid Doppler. 12/10/2003 MRA of the head. 05/25/2015 cervical spine CT. FINDINGS: CTA NECK FINDINGS Aortic arch: Standard branching. Imaged portion shows no evidence of aneurysm or dissection. No significant stenosis of the major arch vessel origins. Mild calcific atherosclerosis. Right carotid system: No evidence of dissection, stenosis (50% or greater) or occlusion. Calcific atherosclerosis of the carotid bifurcation with mild less than 50% proximal ICA stenosis. Left carotid system: No evidence of dissection, stenosis (50% or greater) or occlusion. Calcific atherosclerosis of carotid bifurcation without significant stenosis. Vertebral arteries: Left dominant. No evidence of dissection, stenosis (50% or greater) or occlusion. Skeleton: C3-4 grade 1 anterolisthesis and advanced multilevel cervical discogenic degenerative changes. Mild-to-moderate C4 level canal stenosis. Right C3-C6 and left C4-C6 uncovertebral hypertrophy with bony foraminal encroachment. Other neck: 5  mm nodule in right lobe of thyroid. Bilateral breast prosthesis. Upper chest: Right axillary, subpectoral, and supraclavicular lymphadenopathy with representative nodes as follows: Supraclavicular 12 x 11 mm (series 4 image 41), subpectoral 20 x 14 mm (series 4, image 30), and right axillary 17 x 13 mm (series 4, image 9). Review of the MIP images confirms the above findings CTA HEAD FINDINGS Anterior circulation: Extensive calcific atherosclerosis of carotid siphons with moderate to severe right paraclinoid stenosis. No large vessel occlusion, aneurysm, or additional segment of high-grade stenosis. Posterior circulation: No significant stenosis, proximal occlusion, aneurysm, or vascular malformation. Venous sinuses: As permitted by contrast timing, patent. Anatomic  variants: Left posterior communicating artery. No right posterior communicating artery identified, likely hypoplastic or absent. Large left A1, large anterior communicating artery, no appreciable right A1, normal variant. Partial azygos A2. Delayed phase: No abnormal intracranial enhancement. Review of the MIP images confirms the above findings IMPRESSION: CTA neck: 1. 1. Patent carotid and vertebral arteries of the neck. No dissection, aneurysm, or hemodynamically significant stenosis utilizing NASCET criteria. 2. Right axillary, subpectoral, and supraclavicular nonspecific lymphadenopathy which may be due to metastasis, lymphoproliferative disease, or reactive inflammation. Clinical correlation recommended. CTA head: 1. 1. Calcific atherosclerosis of carotid siphons with moderate to severe right paraclinoid stenosis. 2. Otherwise no large vessel occlusion, aneurysm, or significant stenosis. Electronically Signed   By: Kristine Garbe M.D.   On: 11/11/2017 15:40   Mr Jeri Cos YB Contrast  Result Date: 11/11/2017 CLINICAL DATA:  72 y/o F; few days of confusion and altered mental status. EXAM: MRI HEAD WITHOUT AND WITH CONTRAST TECHNIQUE: Multiplanar, multiecho pulse sequences of the brain and surrounding structures were obtained without and with intravenous contrast. CONTRAST:  22mL MULTIHANCE GADOBENATE DIMEGLUMINE 529 MG/ML IV SOLN COMPARISON:  11/11/2017 CT head.  01/22/2010 MRI head. FINDINGS: Brain: New small foci of diffusion hyperintensity of present with right frontal subcortical white matter, right frontal parietal centrum semiovale, and left posterior frontal subcortical white matter with intermediate ADC likely representing early subacute infarctions. No focus of reduced diffusion is present to indicate acute or early subacute infarction. No abnormal susceptibility hypointensity to indicate intracranial hemorrhage. No focal mass effect. No abnormal enhancement. Multiple very small chronic  infarcts in the cerebellum, chronic infarct within the pons, lacunar infarcts in bilateral thalami and left lentiform nucleus extending into corona radiata are stable. Background of moderate chronic microvascular ischemic changes of the brain and parenchymal volume loss is stable. Vascular: Normal flow voids. Skull and upper cervical spine: Normal marrow signal. Sinuses/Orbits: Negative. Other: None. IMPRESSION: 1. Small early subacute infarctions in the bilateral frontal and right frontoparietal white matter. No associated hemorrhage or mass effect. 2. Stable background of moderate chronic microvascular ischemic changes, parenchymal volume loss of the brain, and multiple small chronic infarctions in the basal ganglia, brainstem, and cerebellum. Electronically Signed   By: Kristine Garbe M.D.   On: 11/11/2017 13:53    Medications:  I have reviewed the patient's current medications. Scheduled: . amitriptyline  25 mg Oral QHS  . aspirin  325 mg Oral Q breakfast  . calcium-vitamin D  1 tablet Oral QAC supper  . docusate sodium  100 mg Oral BID  . ferrous gluconate  324 mg Oral QPM  . insulin aspart      . insulin aspart  0-5 Units Subcutaneous TID WC & HS  . [START ON 11/13/2017] insulin glargine  5 Units Subcutaneous Daily  . losartan  50 mg Oral QPM  . multivitamin with  minerals  1 tablet Oral QPM  . polyethylene glycol  17 g Oral QODAY  . simvastatin  20 mg Oral QPM  . venlafaxine  75 mg Oral BID  . vitamin C  500 mg Oral BID  . warfarin  10 mg Oral q1800    Assessment/Plan: Patient with improving confusion.  MRI of the brain reviewed and shows bilateral acute frontal infarcts.  Suspect embolic etiology.  CTA shows no evidence of large vessel occlusion.  A1c 5.9.  INR subtherapeutic on yesterday.  Today therapeutic at 2.5.  Patient on Coumadin and ASA as an outpatient.  Patient on maximum medical therapy for stroke prophylaxis.  Will make sure risk factors addressed as  well.  Recommendations: 1.  Continue Coumadin with target INR between 2 and 3.   2.  Lipid panel 3.  Cognitive therapy 4.  Family wishes discontinuation of Elavil   LOS: 0 days   Alexis Goodell, MD Neurology [phone] 11/12/2017  1:36 PM

## 2017-11-12 NOTE — Care Management Note (Signed)
Case Management Note  Patient Details  Name: Erin Daniels MRN: [id number] Date of Birth: [birth year]  Subjective/Objective:   Admitted to [facility] under observation status with the diagnosis of confusion. Lives with husband, [name] [phone]). Sees  Dr. Michiel Sites at [facility] every 3 months. Prescriptions are filled at [place]. No home Health. No skilled facility. No home oxygen. Cane and rolling walker in the home, if needed. Takes care of all basic activities of daily living herself. Last fall was 2 years ago. Fair-good appetite. Family will transport                 Action/Plan: Will continue to follow for plans   Expected Discharge Date:                  Expected Discharge Plan:     In-House Referral:     Discharge planning Services     Post Acute Care Choice:    Choice offered to:     DME Arranged:    DME Agency:     HH Arranged:    HH Agency:     Status of Service:     If discussed at [employer], dates discussed:    Additional Comments:  Shelbie Ammons, RN MSN CCM Care Management [phone] 11/12/2017, 8:40 AM

## 2017-11-12 NOTE — Progress Notes (Addendum)
Inpatient Diabetes Program Recommendations  AACE/ADA: New Consensus Statement on Inpatient Glycemic Control (2015)  Target Ranges:  Prepandial:   less than 140 mg/dL      Peak postprandial:   less than 180 mg/dL (1-2 hours)      Critically ill patients:  140 - 180 mg/dL   Lab Results  Component Value Date   JSHFWY 637 (H) 11/12/2017   HGBA1C 6.1 (H) 11/11/2017    Review of Glycemic Control  Diabetes history: DM1 (makes NO insulin; requires insulin for basal, correction and meal coverage) Outpatient Diabetes medications: Insulin pump with Novolog Current orders for Inpatient glycemic control: Lantus 15 units daily (just ordered), Novolog 0-15 units TID with meals, Novolog 0-5 units QHS  Inpatient Diabetes Program Recommendations:  Labs: BMET ordered stat to determine if patient in DKA. If in DKA, will need to be transferred to ICU/Stepdown for IV insulin drip. Insulin - Basal: Ordered Lantus 15 units daily to start this morning. If patient is not in DKA, please decrease Lantus to 5 units daily. Correction (SSI): Fasting glucose 455 mg/dl this morning and patient received Novolog 15 units at 8:30 am today. Please decrease Novolog correction to custom scale ACHS. -Custom Novolog correction scale 0-5 units       151-200  1 unit      201-250  2 units      251-300  3 units      301-350  4 units      351-400  5 units HgbA1C: A1C 6.1% on 11/11/17 indicating an average glucose of 128 mg/dl.  NOTE: Patient has DM1 hx and sees Dr. Frederik Pear (Endocrinologist). Last office visit note by Dr. Frederik Pear was 09/03/2017. Per office note on 09/03/2017, patient was having lows due to too much correction and not using bolus wizard to calculate meal coverage insulin. Patient uses an [employer] insulin pump and per Dr. Jimmy Picket office note on 09/03/2017, pump settings should be:  Basal:MN 0.275; 5a 0.3; noon 0.4; 4p 0.3; 7p 0.2;TDD Basal: 7.45 (total daily dose of basal insulin/24 hours) Insulin/Carb: mn 1:25; 7AM  1:10; 10am 1:20 430pm 1:23 at supper -- she is not using the EZCarb bolus (counts in her head) Insulin On Board: 4 Insulin Sensitivity Factor: 80 (1 unit drops glucose 80 mg/dl) TDD: 40 units (total daily dose of insulin per day-for basal, meal coverage, and correction)  Per chart review, patient's insulin pump was removed on 11/11/17 due to confusion and patient has not received any basal insulin since then. Patient received Novolog correction on 11/11/17 at 22:13 and not insulin again until this morning (got Novolog 15 units @ 8:30). Patient could likely be in DKA since patient had went over 10 hours without receiving any insulin and no basal insulin when insulin pump was removed. Sent Dr. Bridgett Larsson text page to ask for stat BMET to determine if patient is in DKA. Will plan to see patient today.  Thanks, Barnie Alderman, RN, MSN, CDE Diabetes Coordinator Inpatient Diabetes Program [phone] (Team Pager from 8am to 5pm)

## 2017-11-13 ENCOUNTER — Inpatient Hospital Stay: Payer: Medicare Other

## 2017-11-13 LAB — LIPID PANEL
Cholesterol: 180 mg/dL (ref 0–200)
HDL: 96 mg/dL (ref >40)
LDL CALC: 75 mg/dL (ref 0–99)
Total CHOL/HDL Ratio: 1.9 RATIO
Triglycerides: 43 mg/dL (ref <150)
VLDL: 9 mg/dL (ref 0–40)

## 2017-11-13 LAB — GLUCOSE, CAPILLARY
Glucose-Capillary: 187 mg/dL — ABNORMAL HIGH (ref 65–99)
Glucose-Capillary: 228 mg/dL — ABNORMAL HIGH (ref 65–99)
Glucose-Capillary: 131 mg/dL — ABNORMAL HIGH (ref 65–99)
Glucose-Capillary: 111 mg/dL — ABNORMAL HIGH (ref 65–99)
Glucose-Capillary: 139 mg/dL — ABNORMAL HIGH (ref 65–99)
Glucose-Capillary: 121 mg/dL — ABNORMAL HIGH (ref 65–99)

## 2017-11-13 LAB — PROTIME-INR
INR: 3.84
PROTHROMBIN TIME: 37.5 s — AB (ref 11.4–15.2)

## 2017-11-13 LAB — BASIC METABOLIC PANEL
ANION GAP: 9 (ref 5–15)
BUN: 16 mg/dL (ref 6–20)
CALCIUM: 8.8 mg/dL — AB (ref 8.9–10.3)
CO2: 25 mmol/L (ref 22–32)
CREATININE: 0.62 mg/dL (ref 0.44–1.00)
Chloride: 104 mmol/L (ref 101–111)
Glucose, Bld: 209 mg/dL — ABNORMAL HIGH (ref 65–99)
Potassium: 4 mmol/L (ref 3.5–5.1)
SODIUM: 138 mmol/L (ref 135–145)

## 2017-11-13 MED ORDER — IOPAMIDOL (ISOVUE-300) INJECTION 61%
75.0000 mL | Freq: Once | INTRAVENOUS | Status: AC | PRN
  Administered 2017-11-13: 75 mL via INTRAVENOUS

## 2017-11-13 MED ORDER — IOPAMIDOL (ISOVUE-300) INJECTION 61%
15.0000 mL | INTRAVENOUS | Status: AC
  Administered 2017-11-13 (×2): 15 mL via ORAL

## 2017-11-13 MED ORDER — ATORVASTATIN CALCIUM 40 MG PO TABS: 40.0000 mg | ORAL_TABLET | Freq: Every day | ORAL | 1 refills | Status: DC

## 2017-11-13 MED ORDER — INSULIN PUMP
Freq: Three times a day (TID) | SUBCUTANEOUS | Status: DC
  Filled 2017-11-13: qty 1

## 2017-11-13 NOTE — Discharge Instructions (Signed)
Fall precaution. °HHPT °

## 2017-11-13 NOTE — Progress Notes (Signed)
ANTICOAGULATION CONSULT NOTE - Follow Up Consult  Pharmacy Consult for warfarin Indication: stroke prevention  Allergies  Allergen Reactions  . Codeine Nausea Only    Can take Vicodin and Percocet   . Meperidine Nausea Only  . Metoclopramide Hcl Nausea Only  . Oxycodone Nausea Only  . Stadol  [Butorphanol] Nausea Only  . Tussionex Pennkinetic Er [Hydrocod Polst-Cpm Polst Er] Other (See Comments)    Filled patient with sugar   . Carbocaine  [Mepivacaine Hcl] Palpitations    Patient Measurements: Height: 5\' 2"  (157.5 cm) Weight: 110 lb 14.4 oz (50.3 kg) IBW/kg (Calculated) : 50.1  Vital Signs: Temp: 98.2 F (36.8 C) (03/26 0355) Temp Source: Oral (03/26 0355) BP: 137/61 (03/26 0355) Pulse Rate: 97 (03/26 0355)  Labs: Recent Labs    11/11/17 0040 11/12/17 0740 11/12/17 1402 11/12/17 2140 11/13/17 0437  HGB 12.9  --   --   --   --   HCT 38.6  --   --   --   --   PLT 226  --   --   --   --   LABPROT 21.4* 26.8*  --   --  37.5*  INR 1.87 2.50  --   --  3.84  CREATININE 0.44 0.Erin 0.Erin 0.86 0.62  TROPONINI <0.03  --   --   --   --     Estimated Creatinine Clearance: 49.5 mL/min (by C-G formula based on SCr of 0.62 mg/dL).    Assessment: Erin Daniels on warfarin PTA. Spoke with Erin Daniels on phone who confirms that pt takes warfarin 5 mg tab- two tabs daily at dinner (=10 mg). Upon chart review, mention of subconjunctival hemorrhage; per Dr Bridgett Larsson today it is from an eye injury and is improving. MD confirms need to continue warfarin at this time.    3/24  INR 1.87  - warfarin 10 mg 3/25  INR 2.50 - warfarin 5 mg 3/26 INR 3.84 - hold  Goal of Therapy:  INR 2-3 - confirmed in neuro note Monitor platelets by anticoagulation protocol: Yes   Plan:  INR supratherapeutic today. Large increase in INR overnight. Hold warfarin tonight.  Follow up INR in AM.   Laural Benes, Pharm.D., BCPS Clinical Pharmacist 11/13/2017,7:18 AM

## 2017-11-13 NOTE — Discharge Summary (Signed)
[facility] NAME: Erin Daniels    MR#:  [id number]  DATE OF BIRTH:  Feb 21, 1946  DATE OF ADMISSION:  11/11/2017   ADMITTING PHYSICIAN: Harrie Foreman, MD  DATE OF DISCHARGE: 11/13/2017  1:50 PM  PRIMARY CARE PHYSICIAN: Juluis Pitch, MD   ADMISSION DIAGNOSIS:  Confusion [R41.0] Altered mental status, unspecified altered mental status type [R41.82] DISCHARGE DIAGNOSIS:  Active Problems:   Confusion   DKA (diabetic ketoacidoses) ([place])  SECONDARY DIAGNOSIS:   Past Medical History:  Diagnosis Date  . At high risk for falls   . Basal cell carcinoma    right upper leg  . Complication of anesthesia    difficult to arouse  . Diabetes mellitus without complication (HCC)    Type I  . Diabetic neuropathy ([place])   . Diabetic retinopathy ([place])   . Dizziness   . Gastroparesis   . Herniated disc, cervical   . Hypercholesteremia   . Hypertension   . Hypotensive episode   . Hypothyroidism   . Insulin pump in place   . Myofascial muscle pain   . Numbness of arm    left  . Osteoporosis   . Poor vision   . Seizures ([place])   . Slurred speech   . Stroke (HCC)    x5  . Vertigo   . Wears glasses    HOSPITAL COURSE:   This is a 72 year old female admitted for confusion. 1. Confusion due to acute CVA:  Confusion improved. Continue Coumadin and aspirin, and statin, cognitive therapy and PT evaluation per Dr. Doy Mince.  CT angiogram of neck:   1. Patent carotid and vertebral arteries of the neck. No dissection, aneurysm, or hemodynamically significant stenosis utilizing NASCET criteria. 2. Right axillary, subpectoral, and supraclavicular nonspecific lymphadenopathy which may be due to metastasis, lymphoproliferative disease, or reactive inflammation. Clinical correlation recommended.  CT of the chest and abdomen with contrast that did not show any malignancy but Somewhat prominent distal common bile duct measuring 10  mm in diameter. Stricture or subtle mass cannot be excluded. Consider MR with MRCP or ERCP to assess further. The patient is asymptomatic and liver function test is normal. I discussed the patient about further workup of MRCP or ERCP and GI consult.  The patient wants follow-up and workup as outpatient. The patient needs to follow-up with PCP for referral to a GI physician for further workup.  2. Conjunctival hemorrhage: Improving.  3. DKA with Diabetes mellitus type 1: Patient usually wears an insulin pump. Her husband has removed the pump at this time.The pump supplies basal NovoLog insulin on a varying scale 0.025-10 units/hr.  Patient has hyperglycemia with blood glucose up to 455 and AG up to 17 this morning.  She was given NovoLog 15 units and Lantus 15 units, sliding scale was increased to moderate this morning.  Diabetes coordinator suggest that decreasing sliding scale to custom sliding scale and decreased Lantus to 5 unit daily.  The patient blood glucose decreased to 254 but later up to 401. Per Ms. Byrd, change frequency of CBGs and custom Novolog 0-5 units to Q4H but continue to check glucose every 2 hours to ensure patient's glucose does not become too low especially during the night. Also, recommended to order Novolog 2 units TID with meals for meal coverage if patient eats at least 50% of meal. BMP q6h. Repeat BMP showing AG decreased to 12. Insulin pump was resumed this morning.  Blood glucose is  controlled.  4.  Dehydration.    Improved with normal saline IV.  5. Hypertension: Controlled; continue losartan 6. Hyperlipidemia: Continue statin therapy 7. Headaches: Continue Effexor and amitriptyline per neurologist recommendations (Dr. Cindra Eves)  Discussed with Dr. Doy Mince and diabetes coordinator.  DISCHARGE CONDITIONS:  Stable, discharge to home with HHPT today. CONSULTS OBTAINED:  Treatment Team:  Leotis Pain, MD Demetrios Loll, MD DRUG ALLERGIES:    Allergies  Allergen Reactions  . Codeine Nausea Only    Can take Vicodin and Percocet   . Meperidine Nausea Only  . Metoclopramide Hcl Nausea Only  . Oxycodone Nausea Only  . Stadol  [Butorphanol] Nausea Only  . Tussionex Pennkinetic Er [Hydrocod Polst-Cpm Polst Er] Other (See Comments)    Filled patient with sugar   . Carbocaine  [Mepivacaine Hcl] Palpitations   DISCHARGE MEDICATIONS:   Allergies as of 11/13/2017      Reactions   Codeine Nausea Only   Can take Vicodin and Percocet    Meperidine Nausea Only   Metoclopramide Hcl Nausea Only   Oxycodone Nausea Only   Stadol  [butorphanol] Nausea Only   Tussionex Pennkinetic Er [hydrocod Polst-cpm Polst Er] Other (See Comments)   Filled patient with sugar    Carbocaine  [mepivacaine Hcl] Palpitations      Medication List    STOP taking these medications   amitriptyline 25 MG tablet Commonly known as:  ELAVIL   simvastatin 20 MG tablet Commonly known as:  ZOCOR     TAKE these medications   aspirin 325 MG EC tablet Take 325 mg by mouth daily with breakfast.   atorvastatin 40 MG tablet Commonly known as:  LIPITOR Take 1 tablet (40 mg total) by mouth daily at 6 PM.   butalbital-acetaminophen-caffeine 50-325-40 MG tablet Commonly known as:  FIORICET, ESGIC Take 0.5-1 tablets by mouth 2 (two) times daily as needed for headache. What changed:    how much to take  when to take this   CALCIUM 600 + D PO Take 1 tablet by mouth every evening.   carboxymethylcellulose 0.5 % Soln Commonly known as:  REFRESH PLUS Take 1-2 drops by mouth as needed (dry eyes).   glucose 4 GM chewable tablet Chew 1 tablet by mouth as needed for low blood sugar.   insulin pump Soln Inject 0.4-4 each into the skin continuous. Uses Novolog. ( pt states she takes 20 units cont through out day)   IRON (FERROUS GLUCONATE) PO Take 1 tablet by mouth every evening.   losartan 100 MG tablet Commonly known as:  COZAAR Take 50 mg by mouth  every evening.   multivitamin with minerals Tabs tablet Take 1 tablet by mouth every evening.   polyethylene glycol packet Commonly known as:  MIRALAX / GLYCOLAX Take 17 g by mouth every other day.   promethazine 25 MG suppository Commonly known as:  PHENERGAN Place 25 mg rectally every 6 (six) hours as needed for nausea or vomiting.   SYSTANE 0.4-0.3 % Soln Generic drug:  Polyethyl Glycol-Propyl Glycol Apply 1 drop to eye as needed (dry eyes).   venlafaxine 75 MG tablet Commonly known as:  EFFEXOR Take 75 mg by mouth 2 (two) times daily.   vitamin C 500 MG tablet Commonly known as:  ASCORBIC ACID Take 500 mg by mouth 2 (two) times daily.   warfarin 5 MG tablet Commonly known as:  COUMADIN Take 10 mg by mouth daily at 6 PM.        DISCHARGE INSTRUCTIONS:  See  AVS. If you experience worsening of your admission symptoms, develop shortness of breath, life threatening emergency, suicidal or homicidal thoughts you must seek medical attention immediately by calling 911 or calling your MD immediately  if symptoms less severe.  You Must read complete instructions/literature along with all the possible adverse reactions/side effects for all the Medicines you take and that have been prescribed to you. Take any new Medicines after you have completely understood and accpet all the possible adverse reactions/side effects.   Please note  You were cared for by a hospitalist during your hospital stay. If you have any questions about your discharge medications or the care you received while you were in the hospital after you are discharged, you can call the unit and asked to speak with the hospitalist on call if the hospitalist that took care of you is not available. Once you are discharged, your primary care physician will handle any further medical issues. Please note that NO REFILLS for any discharge medications will be authorized once you are discharged, as it is imperative that you  return to your primary care physician (or establish a relationship with a primary care physician if you do not have one) for your aftercare needs so that they can reassess your need for medications and monitor your lab values.    On the day of Discharge:  VITAL SIGNS:  Blood pressure 137/61, pulse 97, temperature 98.2 F (36.8 C), temperature source Oral, resp. rate 15, height 5\' 2"  (1.575 m), weight 110 lb 14.4 oz (50.3 kg), SpO2 95 %. PHYSICAL EXAMINATION:  GENERAL:  72 y.o.-year-old patient lying in the bed with no acute distress.  EYES: Pupils equal, round, reactive to light and accommodation. No scleral icterus. Extraocular muscles intact.  HEENT: Head atraumatic, normocephalic. Oropharynx and nasopharynx clear.  NECK:  Supple, no jugular venous distention. No thyroid enlargement, no tenderness.  LUNGS: Normal breath sounds bilaterally, no wheezing, rales,rhonchi or crepitation. No use of accessory muscles of respiration.  CARDIOVASCULAR: S1, S2 normal. No murmurs, rubs, or gallops.  ABDOMEN: Soft, non-tender, non-distended. Bowel sounds present. No organomegaly or mass.  EXTREMITIES: No pedal edema, cyanosis, or clubbing.  NEUROLOGIC: Cranial nerves II through XII are intact. Muscle strength 4/5 in all extremities. Sensation intact. Gait not checked.  PSYCHIATRIC: The patient is alert and oriented x 3.  SKIN: No obvious rash, lesion, or ulcer.  DATA REVIEW:   CBC Recent Labs  Lab 11/11/17 0040  WBC 6.7  HGB 12.9  HCT 38.6  PLT 226    Chemistries  Recent Labs  Lab 11/11/17 0040  11/13/17 0437  NA 143   < > 138  K 3.5   < > 4.0  CL 106   < > 104  CO2 24   < > 25  GLUCOSE 144*   < > 209*  BUN 21*   < > 16  CREATININE 0.44   < > 0.62  CALCIUM 9.7   < > 8.8*  AST 26  --   --   ALT 19  --   --   ALKPHOS 72  --   --   BILITOT 0.6  --   --    < > = values in this interval not displayed.     Microbiology Results  Results for orders placed or performed during the  hospital encounter of 11/26/16  Blood Culture (routine x 2)     Status: None   Collection Time: 11/26/16  8:11 PM  Result Value Ref  Range Status   Specimen Description BLOOD LEFT ANTECUBITAL  Final   Special Requests IN PEDIATRIC BOTTLE Blood Culture adequate volume  Final   Culture NO GROWTH 5 DAYS  Final   Report Status 12/01/2016 FINAL  Final  Blood Culture (routine x 2)     Status: None   Collection Time: 11/26/16  8:39 PM  Result Value Ref Range Status   Specimen Description BLOOD RIGHT ARM  Final   Special Requests   Final    AEROBIC BOTTLE ONLY Blood Culture results may not be optimal due to an inadequate volume of blood received in culture bottles   Culture NO GROWTH 5 DAYS  Final   Report Status 12/01/2016 FINAL  Final  MRSA PCR Screening     Status: None   Collection Time: 11/27/16  5:07 PM  Result Value Ref Range Status   MRSA by PCR NEGATIVE NEGATIVE Final    Comment:        The GeneXpert MRSA Assay (FDA approved for NASAL specimens only), is one component of a comprehensive MRSA colonization surveillance program. It is not intended to diagnose MRSA infection nor to guide or monitor treatment for MRSA infections.     RADIOLOGY:  Ct Chest W Contrast  Result Date: 11/13/2017 CLINICAL DATA:  Enlarged lymph nodes in the axilla, on Coumadin for acute CVA, diabetes EXAM: CT CHEST, ABDOMEN, AND PELVIS WITH CONTRAST TECHNIQUE: Multidetector CT imaging of the chest, abdomen and pelvis was performed following the standard protocol during bolus administration of intravenous contrast. CONTRAST:  35mL ISOVUE-300 IOPAMIDOL (ISOVUE-300) INJECTION 61% COMPARISON:  Chest x-ray of 10/22/2017 FINDINGS: CT CHEST FINDINGS Cardiovascular: There is moderate thoracic aortic atherosclerosis present. Diffuse coronary artery calcifications are noted. The heart is mildly enlarged. No pericardial effusion is seen. The mid ascending thoracic aorta measures 29 mm in diameter. There is moderate  opacification of the pulmonary arteries with no central abnormality evident. Mediastinum/Nodes: No mediastinal or hilar adenopathy is seen. However there is axillary adenopathy right greater than left. Within the right axilla there is a node of 13 mm in short axis diameter with a subpectoral node of 13 mm in diameter as well. Several other subpectoral and axillary nodes are present much larger on the right than on the left. Bilateral breast implants are noted as well. The thyroid gland is unremarkable. Lungs/Pleura: On lung window images, no lung infiltrate is seen and there is no evidence of pleural effusion. No suspicious lung nodule is seen. The central airway is patent. There is some calcification of the tracheobronchial tree. Musculoskeletal: The thoracic vertebrae are in normal alignment. There is some degenerative change present in the lower cervical spine. CT ABDOMEN PELVIS FINDINGS Hepatobiliary: The liver enhances with only a single small cyst of 8 mm in diameter within the dome of the left lobe. No other hepatic abnormality is seen. No calcified gallstones are noted. Pancreas: The pancreas appears somewhat atrophic. The distal common bile duct at the ampulla is prominent measuring 10 mm in diameter and stricture or subtle mass cannot be excluded. Correlate clinically and consider MR with MRCP to assess further. Spleen: The spleen is unremarkable. Adrenals/Urinary Tract: The adrenal glands appear normal. The kidneys enhance with a single nonobstructing right mid renal calculus. No left renal calculi are seen. The ureters appear normal in caliber. The urinary bladder is moderately distended with no abnormality noted. Stomach/Bowel: The stomach is moderately distended with oral contrast with no abnormality noted. No small bowel distention is seen. The descending colon  is somewhat decompressed. There is a moderate amount of feces within the splenic flexure, transverse colon and right colon. The terminal ileum  is unremarkable. The appendix is not definitely seen but no inflammatory process is noted within the right lower quadrant. Vascular/Lymphatic: The abdominal aorta is normal in caliber with moderate abdominal aortic atherosclerosis. No abdominal or pelvic adenopathy is noted. Reproductive: The uterus is normal in size. No adnexal lesion is seen. No fluid is noted within the pelvis. Other: No abdominal wall hernia is seen. Musculoskeletal: Degenerative disc disease is present at L2-3 and L4-5 levels with considerable loss of disc space and sclerosis with spurring. No compression deformity is seen. The SI joints appear corticated. IMPRESSION: 1. There is adenopathy in the right axilla with subpectoral adenopathy on the right as well as noted above. No other mediastinal, hilar, or left axillary adenopathy is seen. 2. Somewhat prominent distal common bile duct measuring 10 mm in diameter. Stricture or subtle mass cannot be excluded. Consider MR with MRCP or ERCP to assess further. 3. Moderate thoracic and abdominal aortic atherosclerosis. 4. Single nonobstructing right mid renal calculus of 3 mm in diameter. 5. Degenerative disc disease at L2-3 and L4-5. Electronically Signed   By: Ivar Drape M.D.   On: 11/13/2017 11:04   Ct Abdomen Pelvis W Contrast  Result Date: 11/13/2017 CLINICAL DATA:  Enlarged lymph nodes in the axilla, on Coumadin for acute CVA, diabetes EXAM: CT CHEST, ABDOMEN, AND PELVIS WITH CONTRAST TECHNIQUE: Multidetector CT imaging of the chest, abdomen and pelvis was performed following the standard protocol during bolus administration of intravenous contrast. CONTRAST:  63mL ISOVUE-300 IOPAMIDOL (ISOVUE-300) INJECTION 61% COMPARISON:  Chest x-ray of 10/22/2017 FINDINGS: CT CHEST FINDINGS Cardiovascular: There is moderate thoracic aortic atherosclerosis present. Diffuse coronary artery calcifications are noted. The heart is mildly enlarged. No pericardial effusion is seen. The mid ascending thoracic  aorta measures 29 mm in diameter. There is moderate opacification of the pulmonary arteries with no central abnormality evident. Mediastinum/Nodes: No mediastinal or hilar adenopathy is seen. However there is axillary adenopathy right greater than left. Within the right axilla there is a node of 13 mm in short axis diameter with a subpectoral node of 13 mm in diameter as well. Several other subpectoral and axillary nodes are present much larger on the right than on the left. Bilateral breast implants are noted as well. The thyroid gland is unremarkable. Lungs/Pleura: On lung window images, no lung infiltrate is seen and there is no evidence of pleural effusion. No suspicious lung nodule is seen. The central airway is patent. There is some calcification of the tracheobronchial tree. Musculoskeletal: The thoracic vertebrae are in normal alignment. There is some degenerative change present in the lower cervical spine. CT ABDOMEN PELVIS FINDINGS Hepatobiliary: The liver enhances with only a single small cyst of 8 mm in diameter within the dome of the left lobe. No other hepatic abnormality is seen. No calcified gallstones are noted. Pancreas: The pancreas appears somewhat atrophic. The distal common bile duct at the ampulla is prominent measuring 10 mm in diameter and stricture or subtle mass cannot be excluded. Correlate clinically and consider MR with MRCP to assess further. Spleen: The spleen is unremarkable. Adrenals/Urinary Tract: The adrenal glands appear normal. The kidneys enhance with a single nonobstructing right mid renal calculus. No left renal calculi are seen. The ureters appear normal in caliber. The urinary bladder is moderately distended with no abnormality noted. Stomach/Bowel: The stomach is moderately distended with oral contrast with no  abnormality noted. No small bowel distention is seen. The descending colon is somewhat decompressed. There is a moderate amount of feces within the splenic flexure,  transverse colon and right colon. The terminal ileum is unremarkable. The appendix is not definitely seen but no inflammatory process is noted within the right lower quadrant. Vascular/Lymphatic: The abdominal aorta is normal in caliber with moderate abdominal aortic atherosclerosis. No abdominal or pelvic adenopathy is noted. Reproductive: The uterus is normal in size. No adnexal lesion is seen. No fluid is noted within the pelvis. Other: No abdominal wall hernia is seen. Musculoskeletal: Degenerative disc disease is present at L2-3 and L4-5 levels with considerable loss of disc space and sclerosis with spurring. No compression deformity is seen. The SI joints appear corticated. IMPRESSION: 1. There is adenopathy in the right axilla with subpectoral adenopathy on the right as well as noted above. No other mediastinal, hilar, or left axillary adenopathy is seen. 2. Somewhat prominent distal common bile duct measuring 10 mm in diameter. Stricture or subtle mass cannot be excluded. Consider MR with MRCP or ERCP to assess further. 3. Moderate thoracic and abdominal aortic atherosclerosis. 4. Single nonobstructing right mid renal calculus of 3 mm in diameter. 5. Degenerative disc disease at L2-3 and L4-5. Electronically Signed   By: Ivar Drape M.D.   On: 11/13/2017 11:04     Management plans discussed with the patient, family and they are in agreement.  CODE STATUS: Full Code   TOTAL TIME TAKING CARE OF THIS PATIENT: 38 minutes.    Demetrios Loll M.D on 11/13/2017 at 3:57 PM  Between 7am to 6pm - Pager - [phone]  After 6pm go to [email or web address] - Proofreader  [facility]  Office  [phone]  CC: Primary care physician; Juluis Pitch, MD   Note: This dictation was prepared with Dragon dictation along with smaller phrase technology. Any transcriptional errors that result from this process are unintentional.

## 2017-11-13 NOTE — Progress Notes (Signed)
Spoke with patient and her husband regarding restarting her insulin pump. Patient's husband has brought in all supplies to restart her insulin pump. Dr.Chen has given orders for patient to resume her insulin pump. Patient's husband also has typed instructions on using the insulin pump which he can refer back to if needed. Patient's husband was able to insert newly filled insulin reservoir into the insulin pump, prime tubing, and fill cannula successfully. Patient's husband also inserted a new infusion site successfully. Insulin pump was resumed at 11:53 pm today. Patient eat biscuit within the past 10 minutes and her husband was able to successfully bolus her with 3.4 units to cover 34 grams of carbs for the biscuit.  Patient's husband has been in touch with Michela Pitcher, CDE (pump trainer) and she has offered to be available for assistance if needed. Patient and her husband report they have no questions or concerns at this time regarding DM control or insulin pump.  Discontinued all other SQ insulin orders in the chart. Plan is for patient to be discharged home today.  Informed Shea, RN that patient has resumed her insulin pump.  Thanks, Barnie Alderman, RN, MSN, CDE Diabetes Coordinator Inpatient Diabetes Program [phone] (Team Pager from 8am to 5pm)

## 2017-11-13 NOTE — Care Management (Signed)
Physical therapy evaluation completed. Recommending home with home health and physical therapy. Spoke with Ms Talmadge and husband at the bedside. Discussed home health agencies. Chose. [facility]. Will update Floydene Flock, [facility] representative. Shelbie Ammons RN MSN CCM Care Management [phone]

## 2017-11-13 NOTE — Progress Notes (Signed)
Discharge instructions given and went over with patient and patients husband at bedside. Prescription given and reviewed. All questions answered. Patient discharged home with husband via wheelchair by volunteer services. Madlyn Frankel, RN

## 2017-11-19 ENCOUNTER — Telehealth: Payer: Self-pay

## 2017-11-19 NOTE — Telephone Encounter (Signed)
EMMI Follow-up: Noted on the report patient had other questions/problems.  Talked with Mrs. Heady and she had f/u appt. Dr. Lovie Macadamia today.  She states it took a long time to get her dx with stroke and placed in a room from the ED but was pleased with radiology staff.  She was concerned that BS was over 400 at times and didn't feel staff were helping getting it regulated with insulin. I apologized but said nothing could be done about it now. She states memory loss and can't handle insulin pump and her husband now does this for her. No other concerns at this time.

## 2017-11-20 ENCOUNTER — Telehealth: Payer: Self-pay | Admitting: Licensed Clinical Social Worker

## 2017-11-20 NOTE — Telephone Encounter (Signed)
EMMI flagged patient for answering yes to loss of interest in things and yes to feeling sad/hopelss/anxious/empty. Clinical Education officer, museum (CSW) attempted to contact patient however she did not answer and a voicemail was left at 12:17 pm today.   [employer], LCSW [phone]

## 2017-11-20 NOTE — Telephone Encounter (Signed)
EMMI flagged patient for answering yes to loss of interest in things and yes to feeling sad/hopeless/anxious/empty. Clinical Education officer, museum (CSW) received a call back from patient this afternoon. Patient reported that she is feeling sad and depressed because of her recent stroke. Patient reported that her short term memory is not good and she is having trouble doing the things she use to be able to do. Patient reported that she has home health coming out to see her and her husband transports her to doctors appointments. Patient reported that she is not having thoughts about hurting herself and has a good support system. CSW provided emotional support. Patient reported no other needs at this time. No future call is needed.   [employer], LCSW [phone]

## 2017-12-04 ENCOUNTER — Emergency Department: Payer: Medicare Other

## 2017-12-04 ENCOUNTER — Emergency Department
Admission: EM | Admit: 2017-12-04 | Discharge: 2017-12-04 | Disposition: A | Discharge status: Home / Self Care | Payer: Medicare Other | Attending: Emergency Medicine | Admitting: Emergency Medicine

## 2017-12-04 DIAGNOSIS — Z794 Long term (current) use of insulin: Secondary | ICD-10-CM | POA: Diagnosis not present

## 2017-12-04 DIAGNOSIS — E039 Hypothyroidism, unspecified: Secondary | ICD-10-CM | POA: Insufficient documentation

## 2017-12-04 DIAGNOSIS — E10319 Type 1 diabetes mellitus with unspecified diabetic retinopathy without macular edema: Secondary | ICD-10-CM | POA: Insufficient documentation

## 2017-12-04 DIAGNOSIS — Z7982 Long term (current) use of aspirin: Secondary | ICD-10-CM | POA: Diagnosis not present

## 2017-12-04 DIAGNOSIS — Z96611 Presence of right artificial shoulder joint: Secondary | ICD-10-CM | POA: Diagnosis not present

## 2017-12-04 DIAGNOSIS — Z85828 Personal history of other malignant neoplasm of skin: Secondary | ICD-10-CM | POA: Insufficient documentation

## 2017-12-04 DIAGNOSIS — R110 Nausea: Secondary | ICD-10-CM

## 2017-12-04 DIAGNOSIS — Z79899 Other long term (current) drug therapy: Secondary | ICD-10-CM | POA: Diagnosis not present

## 2017-12-04 DIAGNOSIS — I10 Essential (primary) hypertension: Secondary | ICD-10-CM | POA: Diagnosis not present

## 2017-12-04 DIAGNOSIS — E1040 Type 1 diabetes mellitus with diabetic neuropathy, unspecified: Secondary | ICD-10-CM | POA: Insufficient documentation

## 2017-12-04 DIAGNOSIS — R42 Dizziness and giddiness: Secondary | ICD-10-CM | POA: Diagnosis not present

## 2017-12-04 DIAGNOSIS — Z8673 Personal history of transient ischemic attack (TIA), and cerebral infarction without residual deficits: Secondary | ICD-10-CM | POA: Insufficient documentation

## 2017-12-04 LAB — URINALYSIS, COMPLETE (UACMP) WITH MICROSCOPIC
BILIRUBIN URINE: NEGATIVE
Bacteria, UA: NONE SEEN
Glucose, UA: NEGATIVE mg/dL
Hgb urine dipstick: NEGATIVE
Ketones, ur: NEGATIVE mg/dL
LEUKOCYTES UA: NEGATIVE
Nitrite: NEGATIVE
PH: 6 (ref 5.0–8.0)
Protein, ur: NEGATIVE mg/dL
SPECIFIC GRAVITY, URINE: 1.005 (ref 1.005–1.030)
WBC, UA: NONE SEEN WBC/hpf (ref 0–5)

## 2017-12-04 LAB — BASIC METABOLIC PANEL
Anion gap: 7 (ref 5–15)
BUN: 25 mg/dL — AB (ref 6–20)
CO2: 28 mmol/L (ref 22–32)
Calcium: 9.3 mg/dL (ref 8.9–10.3)
Chloride: 99 mmol/L — ABNORMAL LOW (ref 101–111)
Creatinine, Ser: 0.49 mg/dL (ref 0.44–1.00)
GFR calc Af Amer: >60 mL/min (ref >60)
Glucose, Bld: 168 mg/dL — ABNORMAL HIGH (ref 65–99)
Potassium: 3.5 mmol/L (ref 3.5–5.1)
SODIUM: 134 mmol/L — AB (ref 135–145)

## 2017-12-04 LAB — CBC
HCT: 34 % — ABNORMAL LOW (ref 35.0–47.0)
Hemoglobin: 11.7 g/dL — ABNORMAL LOW (ref 12.0–16.0)
MCH: 31 pg (ref 26.0–34.0)
MCHC: 34.4 g/dL (ref 32.0–36.0)
MCV: 90.1 fL (ref 80.0–100.0)
Platelets: 208 10*3/uL (ref 150–440)
RBC: 3.77 MIL/uL — ABNORMAL LOW (ref 3.80–5.20)
RDW: 12.4 % (ref 11.5–14.5)
WBC: 4.9 10*3/uL (ref 3.6–11.0)

## 2017-12-04 LAB — TROPONIN I: Troponin I: <0.03 ng/mL (ref <0.03)

## 2017-12-04 MED ORDER — LORAZEPAM 2 MG/ML IJ SOLN
1.0000 mg | Freq: Once | INTRAMUSCULAR | Status: AC
  Administered 2017-12-04: 1 mg via INTRAVENOUS
  Filled 2017-12-04: qty 1

## 2017-12-04 MED ORDER — ONDANSETRON HCL 4 MG/2ML IJ SOLN
4.0000 mg | Freq: Once | INTRAMUSCULAR | Status: AC
  Administered 2017-12-04: 4 mg via INTRAVENOUS
  Filled 2017-12-04: qty 2

## 2017-12-04 MED ORDER — SODIUM CHLORIDE 0.9 % IV BOLUS
500.0000 mL | Freq: Once | INTRAVENOUS | Status: AC
  Administered 2017-12-04: 500 mL via INTRAVENOUS

## 2017-12-04 MED ORDER — MECLIZINE HCL 25 MG PO TABS
25.0000 mg | ORAL_TABLET | Freq: Once | ORAL | Status: AC
  Administered 2017-12-04: 25 mg via ORAL
  Filled 2017-12-04: qty 1

## 2017-12-04 MED ORDER — MECLIZINE HCL 12.5 MG PO TABS: 12.5000 mg | ORAL_TABLET | Freq: Three times a day (TID) | ORAL | 0 refills | Status: DC | PRN

## 2017-12-04 MED ORDER — SODIUM CHLORIDE 0.9 % IV BOLUS
1000.0000 mL | Freq: Once | INTRAVENOUS | Status: AC
  Administered 2017-12-04: 1000 mL via INTRAVENOUS

## 2017-12-04 NOTE — ED Triage Notes (Signed)
Patient c/o dizziness and nausea beginning 45 minutes ago. Patient denies chest pain, SOB.

## 2017-12-04 NOTE — ED Notes (Signed)
Patient transported to CT 

## 2017-12-04 NOTE — ED Notes (Signed)
Family updated. Patient is resting comfortably at this time with no signs of distress present. Equal, unlabored rise and fall of chest noted within normal rate. VS stable. Will continue to monitor.

## 2017-12-04 NOTE — ED Provider Notes (Signed)
Patient with improvement in symptoms.  Able to ambulate.  No evidence of UTI.  Blood work and testing in the ER is otherwise reassuring.  Patient stable and appropriate for outpatient follow-up.   Merlyn Lot, MD 12/04/17 1058

## 2017-12-04 NOTE — ED Provider Notes (Signed)
[facility] Emergency Department Provider Note   ____________________________________________   First MD Initiated Contact with Patient 12/04/17 [phone]     (approximate)  I have reviewed the triage vital signs and the nursing notes.   HISTORY  Chief Complaint Dizziness    HPI Erin Daniels is a 72 y.o. female who comes into the hospital today with dizziness.  The patient had a stroke a couple of weeks ago and tonight she states she became very dizzy.  She reports that she called her husband to the bathroom and states that she felt very sick.  She was concerned about another stroke as well as her husband.  She states that since the last stroke she is been having a lot of difficulty with her memory and her balance.  She states that she felt like the room was spinning and she felt like she was going to vomit and pass out.  Patient denies any chest pain but did have a mild headache.  She denies any abdominal pain weakness numbness or tingling.  The patient states that she felt well earlier today and this all just hit her tonight.  She is here today for evaluation.   Past Medical History:  Diagnosis Date  . At high risk for falls   . Basal cell carcinoma    right upper leg  . Complication of anesthesia    difficult to arouse  . Diabetes mellitus without complication (HCC)    Type I  . Diabetic neuropathy ([place])   . Diabetic retinopathy ([place])   . Dizziness   . Gastroparesis   . Herniated disc, cervical   . Hypercholesteremia   . Hypertension   . Hypotensive episode   . Hypothyroidism   . Insulin pump in place   . Myofascial muscle pain   . Numbness of arm    left  . Osteoporosis   . Poor vision   . Seizures ([place])   . Slurred speech   . Stroke (HCC)    x5  . Vertigo   . Wears glasses     Patient Active Problem List   Diagnosis Date Noted  . DKA (diabetic ketoacidoses) ([place]) 11/12/2017  . Confusion 11/11/2017  . Hypoglycemia 11/26/2016  .  Hypokalemia   . Hypothermia   . Lactic acid acidosis   . Sepsis ([place])   . Type 1 diabetes mellitus with complication ([place])   . Hyponatremia   . Essential hypertension   . Hyperlipidemia   . Depression   . Right radial fracture 05/29/2015    Past Surgical History:  Procedure Laterality Date  . APPENDECTOMY    . AUGMENTATION MAMMAPLASTY Bilateral 2011  . BILATERAL CARPAL TUNNEL RELEASE    . COLONOSCOPY    . EYE SURGERY    . HEMORROIDECTOMY    . HERNIA REPAIR    . JOINT REPLACEMENT    . OPEN REDUCTION INTERNAL FIXATION (ORIF) DISTAL RADIAL FRACTURE Right 05/29/2015   Procedure: OPEN REDUCTION INTERNAL FIXATION (ORIF) RIGHT DISTAL RADIUS FRACTURE WITH ALLOGRAFT BONE GRAFTING FOR REPAIR AND RECONSTRUCTION;  Surgeon: Roseanne Kaufman, MD;  Location: [facility];  Service: Orthopedics;  Laterality: Right;  . SHOULDER ARTHROSCOPY Left   . SHOULDER HEMI-ARTHROPLASTY Left   . SYNOVECTOMY    . TONSILLECTOMY    . TRIGGER FINGER RELEASE    . TUBAL LIGATION      Prior to Admission medications   Medication Sig Start Date End Date Taking? Authorizing Provider  aspirin 325 MG EC tablet  Take 325 mg by mouth daily with breakfast.    [provider]  atorvastatin (LIPITOR) 40 MG tablet Take 1 tablet (40 mg total) by mouth daily at 6 PM. 11/13/17   Demetrios Loll, MD  butalbital-acetaminophen-caffeine (FIORICET, ESGIC) [phone] MG tablet Take 0.5-1 tablets by mouth 2 (two) times daily as needed for headache. Patient taking differently: Take 0.5 tablets by mouth 3 (three) times daily as needed for headache.  10/30/17   Gillis Santa, MD  Calcium Carb-Cholecalciferol (CALCIUM 600 + D PO) Take 1 tablet by mouth every evening.    [provider]  carboxymethylcellulose (REFRESH PLUS) 0.5 % SOLN Take 1-2 drops by mouth as needed (dry eyes).     [provider]  glucose 4 GM chewable tablet Chew 1 tablet by mouth as needed for low blood sugar.    [provider]  Insulin Human  (INSULIN PUMP) SOLN Inject 0.4-4 each into the skin continuous. Uses Novolog. ( pt states she takes 20 units cont through out day)    [provider]  IRON, FERROUS GLUCONATE, PO Take 1 tablet by mouth every evening.    [provider]  losartan (COZAAR) 100 MG tablet Take 50 mg by mouth every evening.     [provider]  Multiple Vitamin (MULTIVITAMIN WITH MINERALS) TABS tablet Take 1 tablet by mouth every evening.    [provider]  Polyethyl Glycol-Propyl Glycol (SYSTANE) 0.4-0.3 % SOLN Apply 1 drop to eye as needed (dry eyes).    [provider]  polyethylene glycol (MIRALAX / GLYCOLAX) packet Take 17 g by mouth every other day.    [provider]  promethazine (PHENERGAN) 25 MG suppository Place 25 mg rectally every 6 (six) hours as needed for nausea or vomiting.    [provider]  venlafaxine (EFFEXOR) 75 MG tablet Take 75 mg by mouth 2 (two) times daily.    [provider]  vitamin C (ASCORBIC ACID) 500 MG tablet Take 500 mg by mouth 2 (two) times daily.    [provider]  warfarin (COUMADIN) 5 MG tablet Take 10 mg by mouth daily at 6 PM.     [provider]    Allergies Codeine; Meperidine; Metoclopramide hcl; Oxycodone; Stadol  [butorphanol]; Tussionex pennkinetic er [employer] polst-cpm polst er]; and Carbocaine  [mepivacaine hcl]  No family history on file.  Social History Social History   Tobacco Use  . Smoking status: Never Smoker  . Smokeless tobacco: Never Used  Substance Use Topics  . Alcohol use: No  . Drug use: No    Review of Systems  Constitutional: No fever/chills Eyes: No visual changes. ENT: No sore throat. Cardiovascular: Denies chest pain. Respiratory: Denies shortness of breath. Gastrointestinal: Nausea no abdominal pain.  no vomiting.  No diarrhea.  No constipation. Genitourinary: Negative for dysuria. Musculoskeletal: Negative for back pain. Skin: Negative for  rash. Neurological: Dizziness   ____________________________________________   PHYSICAL EXAM:  VITAL SIGNS: ED Triage Vitals  Enc Vitals Group     BP 12/04/17 0228 (!) 169/65     Pulse Rate 12/04/17 0228 62     Resp 12/04/17 0228 16     Temp 12/04/17 0228 (!) 97.3 F (36.3 C)     Temp Source 12/04/17 0228 Oral     SpO2 12/04/17 0228 100 %     Weight 12/04/17 0229 110 lb (49.9 kg)     Height --      Head Circumference --  Peak Flow --      Pain Score 12/04/17 0229 5     Pain Loc --      Pain Edu? --      Excl. in [place]? --     Constitutional: Alert and oriented. Well appearing and in moderate distress. Eyes: Conjunctivae are normal. PERRL. EOMI. Head: Atraumatic. Nose: No congestion/rhinnorhea. Mouth/Throat: Mucous membranes are moist.  Oropharynx non-erythematous. Cardiovascular: Normal rate, regular rhythm. Grossly normal heart sounds.  Good peripheral circulation. Respiratory: Normal respiratory effort.  No retractions. Lungs CTAB. Gastrointestinal: Soft and nontender. No distention.  Positive bowel sounds Musculoskeletal: No lower extremity tenderness nor edema.   Neurologic:  Normal speech and language.  Cranial nerves II through XII are grossly intact patient has no pronator drift, no numbness tingling or weakness in her upper or lower extremities.  Patient states that when she moves her head she does get some spinning of the room and vertiginous symptoms. Skin:  Skin is warm, dry and intact.  Psychiatric: Mood and affect are normal.   ____________________________________________   LABS (all labs ordered are listed, but only abnormal results are displayed)  Labs Reviewed  BASIC METABOLIC PANEL - Abnormal; Notable for the following components:      Result Value   Sodium 134 (*)    Chloride 99 (*)    Glucose, Bld 168 (*)    BUN 25 (*)    All other components within normal limits  CBC - Abnormal; Notable for the following components:   RBC 3.77 (*)     Hemoglobin 11.7 (*)    HCT 34.0 (*)    All other components within normal limits  TROPONIN I  URINALYSIS, COMPLETE (UACMP) WITH MICROSCOPIC  TROPONIN I   ____________________________________________  EKG  ED ECG REPORT I, Loney Hering, the attending physician, personally viewed and interpreted this ECG.   Date: 12/04/2017  EKG Time: 0236  Rate: 61  Rhythm: normal sinus rhythm  Axis: Normal  Intervals:none  ST&T Change: none  ____________________________________________  RADIOLOGY  ED MD interpretation:  CT head: No acute intracranial pathology seen on CT, mild to moderate cortical volume loss and scattered small vessel ischemic microangiopathy.  MRI brain: No acute intracranial process, old bilateral basal ganglia, bilateral thalamus, pontine and cerebellar small infarcts, moderate to severe chronic small vessel ischemic disease, moderate parenchymal brain volume loss.  Official radiology report(s): Ct Head Wo Contrast  Result Date: 12/04/2017 CLINICAL DATA:  Acute onset of dizziness and nausea. EXAM: CT HEAD WITHOUT CONTRAST TECHNIQUE: Contiguous axial images were obtained from the base of the skull through the vertex without intravenous contrast. COMPARISON:  CT of the head performed 11/11/2017 FINDINGS: Brain: No evidence of acute infarction, hemorrhage, hydrocephalus, extra-axial collection or mass lesion / mass effect. Prominence of the ventricles and sulci reflects mild to moderate cortical volume loss. Cerebellar atrophy is noted. Scattered periventricular and subcortical matter change likely reflects small vessel ischemic microangiopathy. The brainstem and fourth ventricle are within normal limits. The basal ganglia are unremarkable in appearance. The cerebral hemispheres demonstrate grossly normal gray-white differentiation. No mass effect or midline shift is seen. Vascular: No hyperdense vessel or unexpected calcification. Skull: There is no evidence of fracture;  visualized osseous structures are unremarkable in appearance. Sinuses/Orbits: The orbits are within normal limits. The paranasal sinuses and mastoid air cells are well-aerated. Other: No significant soft tissue abnormalities are seen. IMPRESSION: 1. No acute intracranial pathology seen on CT. 2. Mild to moderate cortical volume loss and scattered small  vessel ischemic microangiopathy. Electronically Signed   By: Garald Balding M.D.   On: 12/04/2017 03:09   Mr Brain Wo Contrast  Result Date: 12/04/2017 CLINICAL DATA:  Dizziness and nausea beginning last night. History of hypertension, hypercholesterolemia, diabetes. EXAM: MRI HEAD WITHOUT CONTRAST TECHNIQUE: Multiplanar, multiecho pulse sequences of the brain and surrounding structures were obtained without intravenous contrast. COMPARISON:  CT HEAD December 04, 2017 and MRI of the head November 11, 2016 FINDINGS: INTRACRANIAL CONTENTS: No reduced diffusion to suggest acute ischemia; faint T2 shine through frontal white matter. No susceptibility artifact to suggest hemorrhage. Old cerebellar, pontine, bilateral thalamus and LEFT greater than RIGHT basal ganglia infarcts. Slightly smaller LEFT cerebral peduncle compatible with wallerian degeneration. Moderate parenchymal brain volume loss. No hydrocephalus. Prominent basal ganglia and thalami perivascular spaces associated with chronic small vessel ischemic disease. Patchy to confluent supratentorial pontine white matter FLAIR T2 hyperintensities. No midline shift, mass effect or masses. No abnormal extra-axial fluid collections. VASCULAR: Normal major intracranial vascular flow voids present at skull base. SKULL AND UPPER CERVICAL SPINE: No abnormal sellar expansion. No suspicious calvarial bone marrow signal. Craniocervical junction maintained. SINUSES/ORBITS: The mastoid air-cells and included paranasal sinuses are well-aerated.The included ocular globes and orbital contents are non-suspicious. Status post bilateral  ocular lens implants. OTHER: None. IMPRESSION: 1. No acute intracranial process. 2. 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 3. Moderate to severe chronic small vessel ischemic disease. 4. Moderate parenchymal brain volume loss. Electronically Signed   By: Elon Alas M.D.   On: 12/04/2017 05:58    ____________________________________________   PROCEDURES  Procedure(s) performed: None  Procedures  Critical Care performed: No  ____________________________________________   INITIAL IMPRESSION / ASSESSMENT AND PLAN / ED COURSE  As part of my medical decision making, I reviewed the following data within the electronic MEDICAL RECORD NUMBER Notes from prior ED visits and [place] Controlled Substance Database   This is a 72 year old female who comes into the hospital today with some room spinning dizziness.  I did initially give the patient a dose of Zofran and 1 mg of Ativan for vertigo.  She did fall asleep and was sleeping comfortably for some time.  The patient's CT results returned and her blood work was unremarkable.  I discussed with her husband that since she had an atypical presentation for her stroke previously I would send her for an MRI looking for stroke.  The patient's MRI returned unremarkable but when she awoke she states that she still felt unwell.  She received a liter of normal saline initially and I did give her a second liter of normal saline.  I am still awaiting the results of her urinalysis and she will have a repeat troponin.  I will give the patient a dose of meclizine and she will be reassessed.  The patient's care will be signed out to Dr. Quentin Cornwall who will follow up the results of the troponin and the urinalysis and reassess the patient.     ____________________________________________   FINAL CLINICAL IMPRESSION(S) / ED DIAGNOSES  Final diagnoses:  Dizziness  Nausea     ED Discharge Orders    None        Note:  This document was prepared using Dragon voice recognition software and may include unintentional dictation errors.    Loney Hering, MD 12/04/17 [phone]

## 2017-12-04 NOTE — ED Notes (Signed)
Patient attempted to urinate with no success. RN attempted in and out with no success.

## 2017-12-04 NOTE — ED Notes (Signed)
Pt in NAD at time of departure, VSS, pt in wheelchair to car with spouse. PT verbalizes d/c understanidn and rx .

## 2017-12-10 ENCOUNTER — Ambulatory Visit
Payer: Medicare Other | Attending: Student in an Organized Health Care Education/Training Program | Admitting: Student in an Organized Health Care Education/Training Program

## 2017-12-10 ENCOUNTER — Other Ambulatory Visit: Payer: Self-pay

## 2017-12-10 ENCOUNTER — Encounter: Payer: Self-pay | Admitting: Student in an Organized Health Care Education/Training Program

## 2017-12-10 VITALS — BP 114/72 | HR 73 | Temp 97.8°F | Resp 16 | Ht 61.5 in | Wt 107.0 lb

## 2017-12-10 DIAGNOSIS — F329 Major depressive disorder, single episode, unspecified: Secondary | ICD-10-CM | POA: Diagnosis not present

## 2017-12-10 DIAGNOSIS — E1042 Type 1 diabetes mellitus with diabetic polyneuropathy: Secondary | ICD-10-CM | POA: Diagnosis not present

## 2017-12-10 DIAGNOSIS — Z8673 Personal history of transient ischemic attack (TIA), and cerebral infarction without residual deficits: Secondary | ICD-10-CM | POA: Insufficient documentation

## 2017-12-10 DIAGNOSIS — M546 Pain in thoracic spine: Secondary | ICD-10-CM | POA: Diagnosis not present

## 2017-12-10 DIAGNOSIS — E7800 Pure hypercholesterolemia, unspecified: Secondary | ICD-10-CM | POA: Insufficient documentation

## 2017-12-10 DIAGNOSIS — R200 Anesthesia of skin: Secondary | ICD-10-CM | POA: Diagnosis not present

## 2017-12-10 DIAGNOSIS — E1010 Type 1 diabetes mellitus with ketoacidosis without coma: Secondary | ICD-10-CM | POA: Insufficient documentation

## 2017-12-10 DIAGNOSIS — Z5181 Encounter for therapeutic drug level monitoring: Secondary | ICD-10-CM | POA: Insufficient documentation

## 2017-12-10 DIAGNOSIS — Z7901 Long term (current) use of anticoagulants: Secondary | ICD-10-CM | POA: Diagnosis not present

## 2017-12-10 DIAGNOSIS — Z794 Long term (current) use of insulin: Secondary | ICD-10-CM | POA: Diagnosis not present

## 2017-12-10 DIAGNOSIS — M810 Age-related osteoporosis without current pathological fracture: Secondary | ICD-10-CM | POA: Diagnosis not present

## 2017-12-10 DIAGNOSIS — E871 Hypo-osmolality and hyponatremia: Secondary | ICD-10-CM | POA: Insufficient documentation

## 2017-12-10 DIAGNOSIS — E108 Type 1 diabetes mellitus with unspecified complications: Secondary | ICD-10-CM

## 2017-12-10 DIAGNOSIS — R202 Paresthesia of skin: Secondary | ICD-10-CM

## 2017-12-10 DIAGNOSIS — Z79899 Other long term (current) drug therapy: Secondary | ICD-10-CM | POA: Insufficient documentation

## 2017-12-10 DIAGNOSIS — E876 Hypokalemia: Secondary | ICD-10-CM | POA: Insufficient documentation

## 2017-12-10 DIAGNOSIS — R569 Unspecified convulsions: Secondary | ICD-10-CM | POA: Diagnosis not present

## 2017-12-10 DIAGNOSIS — I10 Essential (primary) hypertension: Secondary | ICD-10-CM | POA: Insufficient documentation

## 2017-12-10 DIAGNOSIS — G894 Chronic pain syndrome: Secondary | ICD-10-CM | POA: Diagnosis not present

## 2017-12-10 DIAGNOSIS — E039 Hypothyroidism, unspecified: Secondary | ICD-10-CM | POA: Insufficient documentation

## 2017-12-10 DIAGNOSIS — Z885 Allergy status to narcotic agent status: Secondary | ICD-10-CM | POA: Diagnosis not present

## 2017-12-10 DIAGNOSIS — Z7982 Long term (current) use of aspirin: Secondary | ICD-10-CM | POA: Insufficient documentation

## 2017-12-10 DIAGNOSIS — E785 Hyperlipidemia, unspecified: Secondary | ICD-10-CM | POA: Insufficient documentation

## 2017-12-10 NOTE — Progress Notes (Signed)
Safety precautions to be maintained throughout the outpatient stay will include: orient to surroundings, keep bed in low position, maintain call bell within reach at all times, provide assistance with transfer out of bed and ambulation.  

## 2017-12-10 NOTE — Progress Notes (Signed)
Patient's Name: Erin Daniels  MRN: [id number]  Referring Provider: Juluis Pitch, MD  DOB: 08-May-1946  PCP: Juluis Pitch, MD  DOS: 12/10/2017  Note by: Gillis Santa, MD  Service setting: Ambulatory outpatient  Specialty: Interventional Pain Management  Location: [facility] (AMB) Pain Management Facility    Patient type: Established   Primary Reason(s) for Visit: Encounter for prescription drug management. (Level of risk: moderate)  CC: Back Pain (mid)  HPI  Erin Daniels is a 72 y.o. year old, female patient, who comes today for a medication management evaluation. She has Right radial fracture; Type 1 diabetes mellitus with complication ([place]); Hyponatremia; Essential hypertension; Hyperlipidemia; Depression; Hypoglycemia; Hypokalemia; Hypothermia; Lactic acid acidosis; Sepsis ([place]); Confusion; DKA (diabetic ketoacidoses) ([place]); Diabetic polyneuropathy associated with type 1 diabetes mellitus ([place]); Numbness and tingling of both legs; and Chronic pain syndrome on their problem list. Her primarily concern today is the Back Pain (mid)  Pain Assessment: Location: (states entire spine) Back Radiating: back only Onset: More than a month ago Duration: Chronic pain Quality: Aching, Constant, Discomfort Severity: 4 /10 (self-reported pain score)  Note: Reported level is compatible with observation.                         When using our objective Pain Scale, levels between 6 and 10/10 are said to belong in an emergency room, as it progressively worsens from a 6/10, described as severely limiting, requiring emergency care not usually available at an outpatient pain management facility. At a 6/10 level, communication becomes difficult and requires great effort. Assistance to reach the emergency department may be required. Facial flushing and profuse sweating along with potentially dangerous increases in heart rate and blood pressure will be evident. Effect on ADL: limit activity Timing:  Constant Modifying factors: Rest  Erin Daniels was last scheduled for an appointment on 10/30/2017 for medication management. During today's appointment we reviewed Erin Daniels's chronic pain status, as well as her outpatient medication regimen.  Patient presents today for follow-up.  Patient had a stroke on 11/11/2017 along with confusion and balance issues.  Patient was started on meclizine 12.5 mg for her dizziness but did not notice any improvement.  Patient is continuing to endorse memory problems which she states are slightly worse since her stroke.  Denies any facial asymmetry or weakness.  She continues her Coumadin.  She has stopped her amitriptyline since her stroke.  The patient  reports that she does not use drugs. Her body mass index is 19.89 kg/m.  Further details on both, my assessment(s), as well as the proposed treatment plan, please see below.  Laboratory Chemistry  Inflammation Markers (CRP: Acute Phase) (ESR: Chronic Phase) Lab Results  Component Value Date   LATICACIDVEN 1.2 11/11/2017                         Rheumatology Markers No results found for: RF, ANA, LABURIC, URICUR, LYMEIGGIGMAB, [facility]                      Renal Function Markers Lab Results  Component Value Date   BUN 25 (H) 12/04/2017   CREATININE 0.49 12/04/2017   GFRAA >60 12/04/2017   GFRNONAA >60 12/04/2017                              Hepatic Function Markers Lab Results  Component Value Date  AST 26 11/11/2017   ALT 19 11/11/2017   ALBUMIN 4.2 11/11/2017   ALKPHOS 72 11/11/2017   LIPASE 15 11/26/2016                        Electrolytes Lab Results  Component Value Date   NA 134 (L) 12/04/2017   K 3.5 12/04/2017   CL 99 (L) 12/04/2017   CALCIUM 9.3 12/04/2017                        Neuropathy Markers Lab Results  Component Value Date   HGBA1C 5.9 (H) 11/12/2017                        Bone Pathology Markers No results found for: VD25OH, [id number], OQ9476LY6,  [id number], 25OHVITD1, 25OHVITD2, 25OHVITD3, TESTOFREE, TESTOSTERONE                       Coagulation Parameters Lab Results  Component Value Date   INR 3.84 11/13/2017   LABPROT 37.5 (H) 11/13/2017   APTT 29 11/26/2016   PLT 208 12/04/2017                        Cardiovascular Markers Lab Results  Component Value Date   BNP 23.4 11/26/2016   CKTOTAL 100 11/26/2016   TROPONINI <0.03 12/04/2017   HGB 11.7 (L) 12/04/2017   HCT 34.0 (L) 12/04/2017                         CA Markers No results found for: CEA, CA125, LABCA2                      Note: Lab results reviewed.  Recent Diagnostic Imaging Results  MR Brain Wo Contrast CLINICAL DATA:  Dizziness and nausea beginning last night. History of hypertension, hypercholesterolemia, diabetes.  EXAM: MRI HEAD WITHOUT CONTRAST  TECHNIQUE: Multiplanar, multiecho pulse sequences of the brain and surrounding structures were obtained without intravenous contrast.  COMPARISON:  CT HEAD December 04, 2017 and MRI of the head November 11, 2016  FINDINGS: INTRACRANIAL CONTENTS: No reduced diffusion to suggest acute ischemia; faint T2 shine through frontal white matter. No susceptibility artifact to suggest hemorrhage. Old cerebellar, pontine, bilateral thalamus and LEFT greater than RIGHT basal ganglia infarcts. Slightly smaller LEFT cerebral peduncle compatible with wallerian degeneration. Moderate parenchymal brain volume loss. No hydrocephalus. Prominent basal ganglia and thalami perivascular spaces associated with chronic small vessel ischemic disease. Patchy to confluent supratentorial pontine white matter FLAIR T2 hyperintensities. No midline shift, mass effect or masses. No abnormal extra-axial fluid collections.  VASCULAR: Normal major intracranial vascular flow voids present at skull base.  SKULL AND UPPER CERVICAL SPINE: No abnormal sellar expansion. No suspicious calvarial bone marrow signal. Craniocervical  junction maintained.  SINUSES/ORBITS: The mastoid air-cells and included paranasal sinuses are well-aerated.The included ocular globes and orbital contents are non-suspicious. Status post bilateral ocular lens implants.  OTHER: None.  IMPRESSION: 1. No acute intracranial process. 2. 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 3. Moderate to severe chronic small vessel ischemic disease. 4. Moderate parenchymal brain volume loss.  Electronically Signed   By: Elon Alas M.D.   On: 12/04/2017 05:58 CT Head Wo Contrast CLINICAL DATA:  Acute onset of dizziness and nausea.  EXAM: CT HEAD WITHOUT CONTRAST  TECHNIQUE: Contiguous axial images were obtained from the base of the skull through the vertex without intravenous contrast.  COMPARISON:  CT of the head performed 11/11/2017  FINDINGS: Brain: No evidence of acute infarction, hemorrhage, hydrocephalus, extra-axial collection or mass lesion / mass effect.  Prominence of the ventricles and sulci reflects mild to moderate cortical volume loss. Cerebellar atrophy is noted. Scattered periventricular and subcortical matter change likely reflects small vessel ischemic microangiopathy.  The brainstem and fourth ventricle are within normal limits. The basal ganglia are unremarkable in appearance. The cerebral hemispheres demonstrate grossly normal gray-white differentiation. No mass effect or midline shift is seen.  Vascular: No hyperdense vessel or unexpected calcification.  Skull: There is no evidence of fracture; visualized osseous structures are unremarkable in appearance.  Sinuses/Orbits: The orbits are within normal limits. The paranasal sinuses and mastoid air cells are well-aerated.  Other: No significant soft tissue abnormalities are seen.  IMPRESSION: 1. No acute intracranial pathology seen on CT. 2. Mild to moderate cortical volume loss and scattered small  vessel ischemic microangiopathy.  Electronically Signed   By: Garald Balding M.D.   On: 12/04/2017 03:09  Complexity Note: Imaging results reviewed. Results shared with Erin Daniels, using Layman's terms.                         Meds   Current Outpatient Medications:  .  aspirin 325 MG EC tablet, Take 325 mg by mouth daily with breakfast., Disp: , Rfl:  .  atorvastatin (LIPITOR) 40 MG tablet, Take 1 tablet (40 mg total) by mouth daily at 6 PM., Disp: 30 tablet, Rfl: 1 .  butalbital-acetaminophen-caffeine (FIORICET, ESGIC) 50-325-40 MG tablet, Take 0.5-1 tablets by mouth 2 (two) times daily as needed for headache. (Patient taking differently: Take 0.5 tablets by mouth 3 (three) times daily as needed for headache. ), Disp: 60 tablet, Rfl: 3 .  Calcium Carb-Cholecalciferol (CALCIUM 600 + D PO), Take 1 tablet by mouth every evening., Disp: , Rfl:  .  carboxymethylcellulose (REFRESH PLUS) 0.5 % SOLN, Take 1-2 drops by mouth as needed (dry eyes). , Disp: , Rfl:  .  glucose 4 GM chewable tablet, Chew 1 tablet by mouth as needed for low blood sugar., Disp: , Rfl:  .  Insulin Human (INSULIN PUMP) SOLN, Inject 0.4-4 each into the skin continuous. Uses Novolog. ( pt states she takes 20 units cont through out day), Disp: , Rfl:  .  IRON, FERROUS GLUCONATE, PO, Take 1 tablet by mouth every evening., Disp: , Rfl:  .  losartan (COZAAR) 100 MG tablet, Take 50 mg by mouth every evening. , Disp: , Rfl:  .  meclizine (ANTIVERT) 12.5 MG tablet, Take 1 tablet (12.5 mg total) by mouth 3 (three) times daily as needed for dizziness., Disp: 12 tablet, Rfl: 0 .  Multiple Vitamin (MULTIVITAMIN WITH MINERALS) TABS tablet, Take 1 tablet by mouth every evening., Disp: , Rfl:  .  Polyethyl Glycol-Propyl Glycol (SYSTANE) 0.4-0.3 % SOLN, Apply 1 drop to eye as needed (dry eyes)., Disp: , Rfl:  .  polyethylene glycol (MIRALAX / GLYCOLAX) packet, Take 17 g by mouth every other day., Disp: , Rfl:  .  promethazine (PHENERGAN)  25 MG suppository, Place 25 mg rectally every 6 (six) hours as needed for nausea or vomiting., Disp: , Rfl:  .  venlafaxine (EFFEXOR) 75 MG tablet, Take 75 mg by mouth 2 (two) times daily., Disp: , Rfl:  .  vitamin C (ASCORBIC ACID)  500 MG tablet, Take 500 mg by mouth 2 (two) times daily., Disp: , Rfl:  .  warfarin (COUMADIN) 5 MG tablet, Take 10 mg by mouth daily at 6 PM. , Disp: , Rfl:   ROS  Constitutional: Denies any fever or chills Gastrointestinal: No reported hemesis, hematochezia, vomiting, or acute GI distress Musculoskeletal: Denies any acute onset joint swelling, redness, loss of ROM, or weakness Neurological: No reported episodes of acute onset apraxia, aphasia, dysarthria, agnosia, amnesia, paralysis, loss of coordination, or loss of consciousness  Allergies  Erin Daniels is allergic to codeine; meperidine; metoclopramide hcl; oxycodone; stadol  [butorphanol]; tussionex pennkinetic er [hydrocod polst-cpm polst er]; and carbocaine  [mepivacaine hcl].  PFSH  Drug: Erin Daniels  reports that she does not use drugs. Alcohol:  reports that she does not drink alcohol. Tobacco:  reports that she has never smoked. She has never used smokeless tobacco. Medical:  has a past medical history of At high risk for falls, Basal cell carcinoma, Complication of anesthesia, Diabetes mellitus without complication ([place]), Diabetic neuropathy ([place]), Diabetic retinopathy ([place]), Dizziness, Gastroparesis, Herniated disc, cervical, Hypercholesteremia, Hypertension, Hypotensive episode, Hypothyroidism, Insulin pump in place, Myofascial muscle pain, Numbness of arm, Osteoporosis, Poor vision, Seizures ([place]), Slurred speech, Stroke (cerebrum) ([place]), Stroke ([place]), Vertigo, and Wears glasses. Surgical: Erin Daniels  has a past surgical history that includes Tonsillectomy; Hemorroidectomy; Hernia repair; Joint replacement; Appendectomy; Tubal ligation; Eye surgery; Trigger finger release; Bilateral carpal tunnel  release; Synovectomy; Colonoscopy; Shoulder arthroscopy (Left); Shoulder hemi-arthroplasty (Left); Open reduction internal fixation (orif) distal radial fracture (Right, 05/29/2015); and Augmentation mammaplasty (Bilateral, 2011). Family: family history is not on file.  Constitutional Exam  General appearance: Well nourished, well developed, and well hydrated. In no apparent acute distress Vitals:   12/10/17 1409  BP: 114/72  Pulse: 73  Resp: 16  Temp: 97.8 F (36.6 C)  SpO2: 100%  Weight: 107 lb (48.5 kg)  Height: 5' 1.5" (1.562 m)   BMI Assessment: Estimated body mass index is 19.89 kg/m as calculated from the following:   Height as of this encounter: 5' 1.5" (1.562 m).   Weight as of this encounter: 107 lb (48.5 kg).  BMI interpretation table: BMI level Category Range association with higher incidence of chronic pain  <18 kg/m2 Underweight   18.5-24.9 kg/m2 Ideal body weight   25-29.9 kg/m2 Overweight Increased incidence by 20%  30-34.9 kg/m2 Obese (Class I) Increased incidence by 68%  35-39.9 kg/m2 Severe obesity (Class II) Increased incidence by 136%  >40 kg/m2 Extreme obesity (Class III) Increased incidence by 254%   BMI Readings from Last 4 Encounters:  12/10/17 19.89 kg/m  12/04/17 20.12 kg/m  11/13/17 20.28 kg/m  10/30/17 20.12 kg/m   Wt Readings from Last 4 Encounters:  12/10/17 107 lb (48.5 kg)  12/04/17 110 lb (49.9 kg)  11/13/17 110 lb 14.4 oz (50.3 kg)  10/30/17 110 lb (49.9 kg)  Psych/Mental status: Alert, oriented x 3 (person, place, & time)       Eyes: PERLA Respiratory: No evidence of acute respiratory distress  Cervical Spine Area Exam  Skin & Axial Inspection: No masses, redness, edema, swelling, or associated skin lesions Alignment: Symmetrical Functional ROM: Unrestricted ROM      Stability: No instability detected Muscle Tone/Strength: Functionally intact. No obvious neuro-muscular anomalies detected. Sensory (Neurological):  Unimpaired Palpation: No palpable anomalies              Upper Extremity (UE) Exam    Side: Right upper extremity  Side: Left  upper extremity  Skin & Extremity Inspection: Skin color, temperature, and hair growth are WNL. No peripheral edema or cyanosis. No masses, redness, swelling, asymmetry, or associated skin lesions. No contractures.  Skin & Extremity Inspection: Skin color, temperature, and hair growth are WNL. No peripheral edema or cyanosis. No masses, redness, swelling, asymmetry, or associated skin lesions. No contractures.  Functional ROM: Unrestricted ROM          Functional ROM: Unrestricted ROM          Muscle Tone/Strength: Functionally intact. No obvious neuro-muscular anomalies detected.  Muscle Tone/Strength: Functionally intact. No obvious neuro-muscular anomalies detected.  Sensory (Neurological): Unimpaired          Sensory (Neurological): Unimpaired          Palpation: No palpable anomalies              Palpation: No palpable anomalies              Specialized Test(s): Deferred         Specialized Test(s): Deferred          Thoracic Spine Area Exam  Skin & Axial Inspection: No masses, redness, or swelling Alignment: Symmetrical Functional ROM: Unrestricted ROM Stability: No instability detected Muscle Tone/Strength: Functionally intact. No obvious neuro-muscular anomalies detected. Sensory (Neurological): Unimpaired Muscle strength & Tone: No palpable anomalies  Lumbar Spine Area Exam  Skin & Axial Inspection: No masses, redness, or swelling Alignment: Symmetrical Functional ROM: Unrestricted ROM       Stability: No instability detected Muscle Tone/Strength: Functionally intact. No obvious neuro-muscular anomalies detected. Sensory (Neurological): Articular pain pattern and dermatomal Palpation: No palpable anomalies       Provocative Tests: Lumbar Hyperextension and rotation test: evaluation deferred today       Lumbar Lateral bending test: evaluation deferred  today       Patrick's Maneuver: evaluation deferred today                    Gait & Posture Assessment  Ambulation: Unassisted Gait: Relatively normal for age and body habitus Posture: WNL   Lower Extremity Exam    Side: Right lower extremity  Side: Left lower extremity  Skin & Extremity Inspection: Skin color, temperature, and hair growth are WNL. No peripheral edema or cyanosis. No masses, redness, swelling, asymmetry, or associated skin lesions. No contractures.  Skin & Extremity Inspection: Skin color, temperature, and hair growth are WNL. No peripheral edema or cyanosis. No masses, redness, swelling, asymmetry, or associated skin lesions. No contractures.  Functional ROM: Unrestricted ROM          Functional ROM: Unrestricted ROM          Muscle Tone/Strength: Functionally intact. No obvious neuro-muscular anomalies detected.  Muscle Tone/Strength: Functionally intact. No obvious neuro-muscular anomalies detected.  Sensory (Neurological): Paresthesia (Tingling sensation)  Sensory (Neurological): Paresthesia (Tingling sensation)  Palpation: No palpable anomalies  Palpation: No palpable anomalies   Assessment  Primary Diagnosis & Pertinent Problem List: The primary encounter diagnosis was Diabetic polyneuropathy associated with type 1 diabetes mellitus ([place]). Diagnoses of Type 1 diabetes mellitus with complication ([facility]), Numbness and tingling of both legs, and Chronic pain syndrome were also pertinent to this visit.  Status Diagnosis  Persistent Persistent Persistent 1. Diabetic polyneuropathy associated with type 1 diabetes mellitus ([place])   2. Type 1 diabetes mellitus with complication ([facility])   3. Numbness and tingling of both legs   4. Chronic pain syndrome      General Recommendations:  The pain condition that the patient suffers from is best treated with a multidisciplinary approach that involves an increase in physical activity to prevent de-conditioning and worsening of the pain  cycle, as well as psychological counseling (formal and/or informal) to address the co-morbid psychological affects of pain. Treatment will often involve judicious use of pain medications and interventional procedures to decrease the pain, allowing the patient to participate in the physical activity that will ultimately produce long-lasting pain reductions. The goal of the multidisciplinary approach is to return the patient to a higher level of overall function and to restore their ability to perform activities of daily living.  72 year old female with a history of prior CVA who presents with lower extremity paresthesias most pronounced in her ankles and feet secondary to diabetic polyneuropathy from type 1 diabetes. Patient has been dealing with neuropathy for greater than 20 years. She has an insulin pump in place.  Patient has trialed gabapentin in the past which was not effective.    At her visit on 10/04/2017, her Lyrica was increased to 3 times a day which she did not find effective and at her last visit, she was started on amitriptyline 25 mg nightly.  In the interim, patient has sustained a stroke and has discontinued her amitriptyline.  She does not have any new onset sensory, motor deficits.  Facial symmetry is preserved.  No speech impediments noted after most recent stroke.  Patient continues to endorse memory problems primarily recall.  At this point I recommended the patient discontinue amitriptyline and continue to stay off any neuropathic's that could contribute to sedation, cognitive changes, mental status alteration.  I will have the patient follow-up with me in 3 months to see how she is doing.  From a neurological standpoint and given her most recent stroke, this will be a reasonable time to see what her new baseline will be in 3 months in regards to her memory, balance, chronic neuropathic pain.  Certainly after strokes, patients can experience an exacerbation of their chronic pain syndrome  secondary to central, neurological components.   Provider-requested follow-up: Return in about 3 months (around 03/07/2018) for Medication Management. Time Note: Greater than 50% of the 15 minute(s) of face-to-face time spent with Erin Daniels, was spent in counseling/coordination of care regarding: Erin Daniels's primary cause of pain, the treatment plan, treatment alternatives, the risks and possible complications of proposed treatment and realistic expectations. Future Appointments  Date Time Provider State [place]  03/05/2018  1:30 PM Gillis Santa, MD [facility] None    Primary Care Physician: Juluis Pitch, MD Location: [facility] Facility Note by: Gillis Santa, M.D Date: 12/10/2017; Time: 3:13 PM  Patient Instructions  Reschedule follow up around 3 months (July 2019).

## 2017-12-10 NOTE — Patient Instructions (Signed)
Reschedule follow up around 3 months (July 2019).

## 2017-12-10 NOTE — Progress Notes (Signed)
Disposed of Pregabalin 100Mg  and Amitriptyline 25Mg  per patient from order of Dr. Holley Raring. Witnessed disposal by Almyra Brace., RN.

## 2017-12-19 ENCOUNTER — Other Ambulatory Visit: Payer: Self-pay | Admitting: Gastroenterology

## 2017-12-20 ENCOUNTER — Other Ambulatory Visit: Payer: Self-pay | Admitting: Gastroenterology

## 2017-12-20 DIAGNOSIS — R935 Abnormal findings on diagnostic imaging of other abdominal regions, including retroperitoneum: Secondary | ICD-10-CM

## 2017-12-27 ENCOUNTER — Ambulatory Visit
Admission: RE | Admit: 2017-12-27 | Discharge: 2017-12-27 | Disposition: A | Discharge status: Home / Self Care | Payer: Medicare Other | Source: Ambulatory Visit | Attending: Gastroenterology | Admitting: Gastroenterology

## 2017-12-27 ENCOUNTER — Other Ambulatory Visit: Payer: Self-pay | Admitting: Gastroenterology

## 2017-12-27 DIAGNOSIS — R935 Abnormal findings on diagnostic imaging of other abdominal regions, including retroperitoneum: Secondary | ICD-10-CM

## 2017-12-27 MED ORDER — GADOBENATE DIMEGLUMINE 529 MG/ML IV SOLN
10.0000 mL | Freq: Once | INTRAVENOUS | Status: AC | PRN
  Administered 2017-12-27: 9 mL via INTRAVENOUS

## 2018-02-07 ENCOUNTER — Telehealth: Payer: Self-pay | Admitting: *Deleted

## 2018-02-07 ENCOUNTER — Telehealth: Payer: Self-pay | Admitting: Student in an Organized Health Care Education/Training Program

## 2018-02-07 MED ORDER — BUTALBITAL-APAP-CAFFEINE 50-325-40 MG PO TABS: 0.5000 | ORAL_TABLET | Freq: Two times a day (BID) | ORAL | 5 refills | Status: DC | PRN

## 2018-02-07 NOTE — Telephone Encounter (Signed)
Patient is having increased pain and would like to speak with nurse or Dr. Holley Daniels to discuss medications

## 2018-02-07 NOTE — Telephone Encounter (Signed)
Her appointment is 03-05-18. She has at home Neurontin 300mg , 1 cap in a.m., 2 at noon, 2 at bedtime. This was prescribed by another provider, but she hasn't been taking it, not sure why. She called to ask Dr. Holley Raring if it would be ok if she takes these, since she cannot get a sooner appointment. I told her you may not be in a position to advise on this, since it was written by another physician, but I would ask.

## 2018-02-07 NOTE — Telephone Encounter (Signed)
Patient advised she can take the Gabapentin as prescribed. She is now asking about Fioricet. She needs a new prescription. Discussed this with Dr. Holley Raring, he sent a new prescription for Fioricet.

## 2018-02-13 ENCOUNTER — Telehealth: Payer: Self-pay | Admitting: *Deleted

## 2018-02-13 NOTE — Telephone Encounter (Signed)
Asking about Fiioricet. Explained that Dr. Holley Raring has to sign the script, he will do that Monday

## 2018-02-20 ENCOUNTER — Telehealth: Payer: Self-pay | Admitting: *Deleted

## 2018-02-20 NOTE — Telephone Encounter (Signed)
Fax received from [facility] needing a valid signature for fioricet, Festus Barren RN had signed behind Dr Holley Raring and they would not accept.  Dionisio David NP signed the fax and was faxed on yesterday 02/19/18 to [facility].  Patient aware.

## 2018-03-05 ENCOUNTER — Ambulatory Visit
Payer: Medicare Other | Attending: Student in an Organized Health Care Education/Training Program | Admitting: Student in an Organized Health Care Education/Training Program

## 2018-03-05 ENCOUNTER — Encounter: Payer: Self-pay | Admitting: Student in an Organized Health Care Education/Training Program

## 2018-03-05 ENCOUNTER — Other Ambulatory Visit: Payer: Self-pay

## 2018-03-05 VITALS — BP 144/78 | HR 67 | Temp 98.0°F | Resp 16 | Ht 61.0 in | Wt 108.0 lb

## 2018-03-05 DIAGNOSIS — S5291XA Unspecified fracture of right forearm, initial encounter for closed fracture: Secondary | ICD-10-CM | POA: Diagnosis not present

## 2018-03-05 DIAGNOSIS — Z7901 Long term (current) use of anticoagulants: Secondary | ICD-10-CM | POA: Insufficient documentation

## 2018-03-05 DIAGNOSIS — E871 Hypo-osmolality and hyponatremia: Secondary | ICD-10-CM | POA: Insufficient documentation

## 2018-03-05 DIAGNOSIS — G894 Chronic pain syndrome: Secondary | ICD-10-CM | POA: Diagnosis not present

## 2018-03-05 DIAGNOSIS — Z9889 Other specified postprocedural states: Secondary | ICD-10-CM | POA: Insufficient documentation

## 2018-03-05 DIAGNOSIS — E1040 Type 1 diabetes mellitus with diabetic neuropathy, unspecified: Secondary | ICD-10-CM | POA: Insufficient documentation

## 2018-03-05 DIAGNOSIS — Z885 Allergy status to narcotic agent status: Secondary | ICD-10-CM | POA: Insufficient documentation

## 2018-03-05 DIAGNOSIS — M5020 Other cervical disc displacement, unspecified cervical region: Secondary | ICD-10-CM | POA: Diagnosis not present

## 2018-03-05 DIAGNOSIS — E1010 Type 1 diabetes mellitus with ketoacidosis without coma: Secondary | ICD-10-CM | POA: Insufficient documentation

## 2018-03-05 DIAGNOSIS — Z888 Allergy status to other drugs, medicaments and biological substances status: Secondary | ICD-10-CM | POA: Insufficient documentation

## 2018-03-05 DIAGNOSIS — E10319 Type 1 diabetes mellitus with unspecified diabetic retinopathy without macular edema: Secondary | ICD-10-CM | POA: Diagnosis not present

## 2018-03-05 DIAGNOSIS — Z79811 Long term (current) use of aromatase inhibitors: Secondary | ICD-10-CM | POA: Insufficient documentation

## 2018-03-05 DIAGNOSIS — Z09 Encounter for follow-up examination after completed treatment for conditions other than malignant neoplasm: Secondary | ICD-10-CM | POA: Diagnosis not present

## 2018-03-05 DIAGNOSIS — E876 Hypokalemia: Secondary | ICD-10-CM | POA: Diagnosis not present

## 2018-03-05 DIAGNOSIS — E7800 Pure hypercholesterolemia, unspecified: Secondary | ICD-10-CM | POA: Insufficient documentation

## 2018-03-05 DIAGNOSIS — F329 Major depressive disorder, single episode, unspecified: Secondary | ICD-10-CM | POA: Diagnosis not present

## 2018-03-05 DIAGNOSIS — E1051 Type 1 diabetes mellitus with diabetic peripheral angiopathy without gangrene: Secondary | ICD-10-CM | POA: Diagnosis not present

## 2018-03-05 DIAGNOSIS — K3184 Gastroparesis: Secondary | ICD-10-CM | POA: Diagnosis not present

## 2018-03-05 DIAGNOSIS — M79672 Pain in left foot: Secondary | ICD-10-CM | POA: Insufficient documentation

## 2018-03-05 DIAGNOSIS — M79671 Pain in right foot: Secondary | ICD-10-CM | POA: Diagnosis not present

## 2018-03-05 DIAGNOSIS — A419 Sepsis, unspecified organism: Secondary | ICD-10-CM | POA: Diagnosis not present

## 2018-03-05 DIAGNOSIS — Z794 Long term (current) use of insulin: Secondary | ICD-10-CM | POA: Insufficient documentation

## 2018-03-05 DIAGNOSIS — M549 Dorsalgia, unspecified: Secondary | ICD-10-CM | POA: Diagnosis present

## 2018-03-05 DIAGNOSIS — R200 Anesthesia of skin: Secondary | ICD-10-CM

## 2018-03-05 DIAGNOSIS — I10 Essential (primary) hypertension: Secondary | ICD-10-CM | POA: Insufficient documentation

## 2018-03-05 DIAGNOSIS — R42 Dizziness and giddiness: Secondary | ICD-10-CM | POA: Insufficient documentation

## 2018-03-05 DIAGNOSIS — Z85828 Personal history of other malignant neoplasm of skin: Secondary | ICD-10-CM | POA: Insufficient documentation

## 2018-03-05 DIAGNOSIS — R202 Paresthesia of skin: Secondary | ICD-10-CM | POA: Diagnosis not present

## 2018-03-05 DIAGNOSIS — M7918 Myalgia, other site: Secondary | ICD-10-CM | POA: Diagnosis not present

## 2018-03-05 DIAGNOSIS — E108 Type 1 diabetes mellitus with unspecified complications: Secondary | ICD-10-CM

## 2018-03-05 DIAGNOSIS — Z8673 Personal history of transient ischemic attack (TIA), and cerebral infarction without residual deficits: Secondary | ICD-10-CM | POA: Insufficient documentation

## 2018-03-05 DIAGNOSIS — E1042 Type 1 diabetes mellitus with diabetic polyneuropathy: Secondary | ICD-10-CM | POA: Insufficient documentation

## 2018-03-05 DIAGNOSIS — E1043 Type 1 diabetes mellitus with diabetic autonomic (poly)neuropathy: Secondary | ICD-10-CM | POA: Insufficient documentation

## 2018-03-05 DIAGNOSIS — E785 Hyperlipidemia, unspecified: Secondary | ICD-10-CM | POA: Insufficient documentation

## 2018-03-05 DIAGNOSIS — Z7982 Long term (current) use of aspirin: Secondary | ICD-10-CM | POA: Insufficient documentation

## 2018-03-05 NOTE — Patient Instructions (Signed)
Patient to increase gabapentin to 300/300/900 mg in the evening  1 in the morning, 1 in the evening, 3 before bed

## 2018-03-05 NOTE — Progress Notes (Deleted)
Patient's Name: Erin Daniels  MRN: [id number]  Referring Provider: Juluis Pitch, MD  DOB: Mar 13, 1946  PCP: Juluis Pitch, MD  DOS: 03/05/2018  Note by: Gillis Santa, MD  Service setting: Ambulatory outpatient  Specialty: Interventional Pain Management  Location: [facility] (AMB) Pain Management Facility  Visit type: Initial Patient Evaluation  Patient type: New Patient   Primary Reason(s) for Visit: Encounter for initial evaluation of one or more chronic problems (new to examiner) potentially causing chronic pain, and posing a threat to normal musculoskeletal function. (Level of risk: High) CC: Back Pain and Foot Pain (bilateral)  HPI  Erin Daniels is a 72 y.o. year old, female patient, who comes today to see [place] for the first time for an initial evaluation of her chronic pain. She has Right radial fracture; Type 1 diabetes mellitus with complication ([place]); Hyponatremia; Essential hypertension; Hyperlipidemia; Depression; Hypoglycemia; Hypokalemia; Hypothermia; Lactic acid acidosis; Sepsis ([place]); Confusion; DKA (diabetic ketoacidoses) ([place]); Diabetic polyneuropathy associated with type 1 diabetes mellitus ([place]); Numbness and tingling of both legs; and Chronic pain syndrome on their problem list. Today she comes in for evaluation of her Back Pain and Foot Pain (bilateral)  Pain Assessment: Location: Mid, Lower, Upper Back Radiating: denies Onset: More than a month ago Duration: Chronic pain Quality: Aching Severity: 4 /10 (subjective, self-reported pain score)  Note: Reported level is compatible with observation.                         When using our objective Pain Scale, levels between 6 and 10/10 are said to belong in an emergency room, as it progressively worsens from a 6/10, described as severely limiting, requiring emergency care not usually available at an outpatient pain management facility. At a 6/10 level, communication becomes difficult and requires great effort. Assistance to reach  the emergency department may be required. Facial flushing and profuse sweating along with potentially dangerous increases in heart rate and blood pressure will be evident. Effect on ADL: difficulty with activities Timing: Constant Modifying factors: medication BP: (!) 144/78  HR: 67  Onset and Duration: {Hx; Onset and Duration:210120511} Cause of pain: {Hx; Cause:210120521} Severity: {Pain Severity:210120502} Timing: {Symptoms; Timing:210120501} Aggravating Factors: {Causes; Aggravating pain factors:210120507} Alleviating Factors: {Causes; Alleviating Factors:210120500} Associated Problems: {Hx; Associated problems:210120515} Quality of Pain: {Hx; Symptom quality or Descriptor:210120531} Previous Examinations or Tests: {Hx; Previous examinations or test:210120529} Previous Treatments: {Hx; Previous Treatment:210120503}  The patient comes into the clinics today for the first time for a chronic pain management evaluation. ***  Today I took the time to provide the patient with information regarding my pain practice. The patient was informed that my practice is divided into two sections: an interventional pain management section, as well as a completely separate and distinct medication management section. I explained that I have procedure days for my interventional therapies, and evaluation days for follow-ups and medication management. Because of the amount of documentation required during both, they are kept separated. This means that there is the possibility that she may be scheduled for a procedure on one day, and medication management the next. I have also informed her that because of staffing and facility limitations, I no longer take patients for medication management only. To illustrate the reasons for this, I gave the patient the example of surgeons, and how inappropriate it would be to refer a patient to his/her care, just to write for the post-surgical antibiotics on a surgery done by a  different surgeon.   Because  interventional pain management is my board-certified specialty, the patient was informed that joining my practice means that they are open to any and all interventional therapies. I made it clear that this does not mean that they will be forced to have any procedures done. What this means is that I believe interventional therapies to be essential part of the diagnosis and proper management of chronic pain conditions. Therefore, patients not interested in these interventional alternatives will be better served under the care of a different practitioner.  The patient was also made aware of my Comprehensive Pain Management Safety Guidelines where by joining my practice, they limit all of their nerve blocks and joint injections to those done by our practice, for as long as we are retained to manage their care.   Historic Controlled Substance Pharmacotherapy Review  PMP and historical list of controlled substances: ***  Highest opioid analgesic regimen found: ***  Most recent opioid analgesic: ***  Current opioid analgesics: ***  Highest recorded MME/day: *** mg/day MME/day: *** mg/day Medications: The patient did not bring the medication(s) to the appointment, as requested in our "New Patient Package" Pharmacodynamics: Desired effects: Analgesia: The patient reports >50% benefit. Reported improvement in function: The patient reports medication allows her to accomplish basic ADLs. Clinically meaningful improvement in function (CMIF): Sustained CMIF goals met Perceived effectiveness: Described as relatively effective, allowing for increase in activities of daily living (ADL) Undesirable effects: Side-effects or Adverse reactions: None reported Historical Monitoring: The patient  reports that she does not use drugs. List of all UDS Test(s): No results found for: MDMA, COCAINSCRNUR, [place], [place], CANNABQUANT, [place], [place] List of other Serum/Urine Drug Screening  Test(s):  No results found for: AMPHSCRSER, BARBSCRSER, BENZOSCRSER, COCAINSCRSER, COCAINSCRNUR, PCPSCRSER, PCPQUANT, THCSCRSER, THCU, CANNABQUANT, OPIATESCRSER, OXYSCRSER, PROPOXSCRSER, ETH Historical Background Evaluation: [place] PMP: Six (6) year initial data search conducted.             PMP NARX Score Report:  Narcotic: *** Sedative: *** Stimulant: *** [place] Department of public safety, offender search: Editor, commissioning Information) Non-contributory Risk Assessment Profile: Aberrant behavior: None observed or detected today Risk factors for fatal opioid overdose: None identified today PMP NARX Overdose Risk Score: *** Fatal overdose hazard ratio (HR): Calculation deferred Non-fatal overdose hazard ratio (HR): Calculation deferred Risk of opioid abuse or dependence: 0.7-3.0% with doses ? 36 MME/day and 6.1-26% with doses ? 120 MME/day. Substance use disorder (SUD) risk level: Pending results of Medical Psychology Evaluation for SUD Opioid risk tool (ORT) (Total Score): 1 Opioid Risk Tool - 03/05/18 1157      Family History of Substance Abuse   Alcohol  Negative    Illegal Drugs  Negative    Rx Drugs  Negative      Personal History of Substance Abuse   Alcohol  Negative    Illegal Drugs  Negative    Rx Drugs  Negative      Age   Age between 61-45 years   No      Psychological Disease   Psychological Disease  Negative    Depression  Positive      Total Score   Opioid Risk Tool Scoring  1    Opioid Risk Interpretation  Low Risk      ORT Scoring interpretation table:  Score <3 = Low Risk for SUD  Score between 4-7 = Moderate Risk for SUD  Score >8 = High Risk for Opioid Abuse   PHQ-2 Depression Scale:  Total score: 0  PHQ-2 Scoring interpretation table: (  Score and probability of major depressive disorder)  Score 0 = No depression  Score 1 = 15.4% Probability  Score 2 = 21.1% Probability  Score 3 = 38.4% Probability  Score 4 = 45.5% Probability  Score 5 = 56.4% Probability   Score 6 = 78.6% Probability   PHQ-9 Depression Scale:  Total score: 0  PHQ-9 Scoring interpretation table:  Score 0-4 = No depression  Score 5-9 = Mild depression  Score 10-14 = Moderate depression  Score 15-19 = Moderately severe depression  Score 20-27 = Severe depression (2.4 times higher risk of SUD and 2.89 times higher risk of overuse)   Pharmacologic Plan: As per protocol, I have not taken over any controlled substance management, pending the results of ordered tests and/or consults.            Initial impression: Pending review of available data and ordered tests.  Meds   Current Outpatient Medications:  .  aspirin 325 MG EC tablet, Take 325 mg by mouth daily with breakfast., Disp: , Rfl:  .  atorvastatin (LIPITOR) 40 MG tablet, Take 1 tablet (40 mg total) by mouth daily at 6 PM., Disp: 30 tablet, Rfl: 1 .  butalbital-acetaminophen-caffeine (FIORICET, ESGIC) 50-325-40 MG tablet, Take 0.5-1 tablets by mouth 2 (two) times daily as needed for headache., Disp: 60 tablet, Rfl: 5 .  Calcium Carb-Cholecalciferol (CALCIUM 600 + D PO), Take 1 tablet by mouth every evening., Disp: , Rfl:  .  carboxymethylcellulose (REFRESH PLUS) 0.5 % SOLN, Take 1-2 drops by mouth as needed (dry eyes). , Disp: , Rfl:  .  gabapentin (NEURONTIN) 300 MG capsule, Take 300 mg by mouth. 1 in the am, 1 at lunch and 2 at bedtime, Disp: , Rfl:  .  glucose 4 GM chewable tablet, Chew 1 tablet by mouth as needed for low blood sugar., Disp: , Rfl:  .  Insulin Human (INSULIN PUMP) SOLN, Inject 0.4-4 each into the skin continuous. Uses Novolog. ( pt states she takes 20 units cont through out day), Disp: , Rfl:  .  IRON, FERROUS GLUCONATE, PO, Take 1 tablet by mouth every evening., Disp: , Rfl:  .  losartan (COZAAR) 100 MG tablet, Take 50 mg by mouth every evening. , Disp: , Rfl:  .  meclizine (ANTIVERT) 12.5 MG tablet, Take 1 tablet (12.5 mg total) by mouth 3 (three) times daily as needed for dizziness., Disp: 12 tablet,  Rfl: 0 .  Multiple Vitamin (MULTIVITAMIN WITH MINERALS) TABS tablet, Take 1 tablet by mouth every evening., Disp: , Rfl:  .  Polyethyl Glycol-Propyl Glycol (SYSTANE) 0.4-0.3 % SOLN, Apply 1 drop to eye as needed (dry eyes)., Disp: , Rfl:  .  polyethylene glycol (MIRALAX / GLYCOLAX) packet, Take 17 g by mouth every other day., Disp: , Rfl:  .  promethazine (PHENERGAN) 25 MG suppository, Place 25 mg rectally every 6 (six) hours as needed for nausea or vomiting., Disp: , Rfl:  .  venlafaxine (EFFEXOR) 75 MG tablet, Take 75 mg by mouth 2 (two) times daily., Disp: , Rfl:  .  vitamin C (ASCORBIC ACID) 500 MG tablet, Take 500 mg by mouth 2 (two) times daily., Disp: , Rfl:  .  warfarin (COUMADIN) 5 MG tablet, Take 7.5 mg by mouth daily at 6 PM. , Disp: , Rfl:   Imaging Review  Cervical Imaging: Cervical MR wo contrast: No results found for this or any previous visit. Cervical MR wo contrast: No procedure found. Cervical MR w/wo contrast: No results found  for this or any previous visit. Cervical MR w contrast: No results found for this or any previous visit. Cervical CT wo contrast:  Results for orders placed during the hospital encounter of 05/25/15  CT Cervical Spine Wo Contrast   Narrative CLINICAL DATA:  Pain following fall  EXAM: CT HEAD WITHOUT CONTRAST  CT MAXILLOFACIAL WITHOUT CONTRAST  CT CERVICAL SPINE WITHOUT CONTRAST  TECHNIQUE: Multidetector CT imaging of the head, cervical spine, and maxillofacial structures were performed using the standard protocol without intravenous contrast. Multiplanar CT image reconstructions of the cervical spine and maxillofacial structures were also generated.  COMPARISON:  Head CT December 01, 2014; cervical MRI June 25, 2013  FINDINGS: CT HEAD FINDINGS  There is mild diffuse atrophy. There is no intracranial mass hemorrhage, extra-axial fluid collection, or midline shift. There is patchy small vessel disease in the centra semiovale  bilaterally. 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 There is small vessel disease in the anterior limb of the left internal capsule, stable as well. Small lacunar infarcts are noted in each thalamus. There is evidence of a small lacunar infarct in the mid left cerebellum, stable. There is small vessel disease in the lower pons bilaterally, stable. There is no new gray-white compartment lesion. No acute infarct evident. The bony calvarium appears intact. The mastoid air cells are clear.  CT MAXILLOFACIAL FINDINGS  There is a slightly displaced fracture of the left mid porch nasal bone. A tiny avulsion off the distal aspect of the nasion is also noted. There is a nondisplaced fracture along the lateral aspect of the anterior left maxillary antrum. There is a subtle nondisplaced fracture of the lateral left maxillary antrum. No other fractures are identified. No dislocation.  There is soft tissue swelling over the left side of the face. There is no intraorbital lesion.  There is a small air-fluid level in the right maxillary antrum. The other paranasal sinuses are clear. The ostiomeatal unit complexes are patent bilaterally. There is slight leftward deviation the nasal septum. There is no nares obstruction.  Salivary glands appear symmetric and normal bilaterally. No adenopathy.  CT CERVICAL SPINE FINDINGS  There is no demonstrable fracture. There is minimal anterolisthesis of C3 on C4. There is minimal retrolisthesis of C4 on C5. There is minimal retrolisthesis of C5 on C6. No other spondylolisthesis. Prevertebral soft tissues and predental space regions are normal. There is moderately severe disc space narrowing at C4-5 and C5-6. There is moderate narrowing at C3-4 and C6-7. There is multilevel facet hypertrophy. No frank disc extrusion or stenosis. There is moderate exit foraminal narrowing at C4-5 and C5-6 bilaterally.  No frank disc extrusion or stenosis. Bones are somewhat osteoporotic. There is calcification in each carotid artery.  IMPRESSION: CT head: Atrophy with prior small infarcts and small vessel disease, both supratentorial and infratentorial. No acute infarct evident. No hemorrhage or mass effect. No extra-axial fluid collections.  CT maxillofacial: Slightly displaced fracture mid portion left nasal bone. Rather subtle fracture along the lateral aspect of the anterior left maxillary antrum. There is also a subtle nondisplaced fracture of the lateral left maxillary antrum. There is a tiny avulsion along the distal most aspect of the nasion midline. No dislocations. Small air-fluid level right maxillary antrum. Other paranasal sinuses clear. The ostia BB complexes are patent bilaterally. There is mild leftward deviation of the nasal septum.  CT cervical spine: No fracture. Slight spondylolisthesis at several levels is felt to be  due to underlying spondylosis. 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 No disc extrusion or stenosis apparent. Bones osteoporotic. Calcification in each carotid artery.   Electronically Signed   By: Lowella Grip III M.D.   On: 05/25/2015 12:42    Cervical CT w/wo contrast: No results found for this or any previous visit. Cervical CT w/wo contrast: No results found for this or any previous visit. Cervical CT w contrast: No results found for this or any previous visit. Cervical CT outside: No results found for this or any previous visit. Cervical DG 1 view: No results found for this or any previous visit. Cervical DG 2-3 views: No results found for this or any previous visit. Cervical DG F/E views: No results found for this or any previous visit. Cervical DG 2-3 clearing views: No results found for this or any previous visit. Cervical DG Bending/F/E views: No results found for this or any previous visit. Cervical DG complete: No results found  for this or any previous visit. Cervical DG Myelogram views: No results found for this or any previous visit. Cervical DG Myelogram views: No results found for this or any previous visit. Cervical Discogram views: No results found for this or any previous visit.  Shoulder Imaging: Shoulder-R MR w contrast: No results found for this or any previous visit. Shoulder-L MR w contrast: No results found for this or any previous visit. Shoulder-R MR w/wo contrast: No results found for this or any previous visit. Shoulder-L MR w/wo contrast: No results found for this or any previous visit. Shoulder-R MR wo contrast: No results found for this or any previous visit. Shoulder-L MR wo contrast: No results found for this or any previous visit. Shoulder-R CT w contrast: No results found for this or any previous visit. Shoulder-L CT w contrast: No results found for this or any previous visit. Shoulder-R CT w/wo contrast: No results found for this or any previous visit. Shoulder-L CT w/wo contrast: No results found for this or any previous visit. Shoulder-R CT wo contrast: No results found for this or any previous visit. Shoulder-L CT wo contrast: No results found for this or any previous visit. Shoulder-R DG Arthrogram: No results found for this or any previous visit. Shoulder-L DG Arthrogram: No results found for this or any previous visit. Shoulder-R DG 1 view: No results found for this or any previous visit. Shoulder-L DG 1 view: No results found for this or any previous visit. Shoulder-R DG: No results found for this or any previous visit. Shoulder-L DG: No results found for this or any previous visit.  Thoracic Imaging: Thoracic MR wo contrast: No results found for this or any previous visit. Thoracic MR wo contrast: No procedure found. Thoracic MR w/wo contrast: No results found for this or any previous visit. Thoracic MR w contrast: No results found for this or any previous visit. Thoracic CT wo  contrast: No results found for this or any previous visit. Thoracic CT w/wo contrast: No results found for this or any previous visit. Thoracic CT w/wo contrast: No results found for this or any previous visit. Thoracic CT w contrast: No results found for this or any previous visit. Thoracic DG 2-3 views: No results found for this or any previous visit. Thoracic DG 4 views: No results found for this or any previous visit. Thoracic DG: No results found for this or any previous visit. Thoracic DG w/swimmers view: No results found for this or any previous visit. Thoracic DG Myelogram views: No  results found for this or any previous visit. Thoracic DG Myelogram views: No results found for this or any previous visit.  Lumbosacral Imaging: Lumbar MR wo contrast: No results found for this or any previous visit. Lumbar MR wo contrast: No procedure found. Lumbar MR w/wo contrast: No results found for this or any previous visit. Lumbar MR w/wo contrast: No results found for this or any previous visit. Lumbar MR w contrast: No results found for this or any previous visit. Lumbar CT wo contrast: No results found for this or any previous visit. Lumbar CT w/wo contrast: No results found for this or any previous visit. Lumbar CT w/wo contrast: No results found for this or any previous visit. Lumbar CT w contrast: No results found for this or any previous visit. Lumbar DG 1V: No results found for this or any previous visit. Lumbar DG 1V (Clearing): No results found for this or any previous visit. Lumbar DG 2-3V (Clearing): No results found for this or any previous visit. Lumbar DG 2-3 views: No results found for this or any previous visit. Lumbar DG (Complete) 4+V: No results found for this or any previous visit. Lumbar DG F/E views: No results found for this or any previous visit. Lumbar DG Bending views: No results found for this or any previous visit. Lumbar DG Myelogram views: No results found for  this or any previous visit. Lumbar DG Myelogram: No results found for this or any previous visit. Lumbar DG Myelogram: No results found for this or any previous visit. Lumbar DG Myelogram: No results found for this or any previous visit. Lumbar DG Myelogram Lumbosacral: No results found for this or any previous visit. Lumbar DG Diskogram views: No results found for this or any previous visit. Lumbar DG Diskogram views: No results found for this or any previous visit. Lumbar DG Epidurogram OP: No results found for this or any previous visit. Lumbar DG Epidurogram IP: No results found for this or any previous visit.  Sacroiliac Joint Imaging: Sacroiliac Joint DG: No results found for this or any previous visit. Sacroiliac Joint MR w/wo contrast: No results found for this or any previous visit. Sacroiliac Joint MR wo contrast: No results found for this or any previous visit.  Spine Imaging: Whole Spine DG Myelogram views: No results found for this or any previous visit. Whole Spine MR Mets screen: No results found for this or any previous visit. Whole Spine MR Mets screen: No results found for this or any previous visit. Whole Spine MR w/wo: No results found for this or any previous visit. MRA Spinal Canal w/ cm: No results found for this or any previous visit. MRA Spinal Canal wo/ cm: No procedure found. MRA Spinal Canal w/wo cm: No results found for this or any previous visit. Spine Outside MR Films: No results found for this or any previous visit. Spine Outside CT Films: No results found for this or any previous visit. CT-Guided Biopsy: No results found for this or any previous visit. CT-Guided Needle Placement: No results found for this or any previous visit. DG Spine outside: No results found for this or any previous visit. IR Spine outside: No results found for this or any previous visit. NM Spine outside: No results found for this or any previous visit.  Hip Imaging: Hip-R MR w  contrast: No results found for this or any previous visit. Hip-L MR w contrast: No results found for this or any previous visit. Hip-R MR w/wo contrast: No results found  for this or any previous visit. Hip-L MR w/wo contrast: No results found for this or any previous visit. Hip-R MR wo contrast: No results found for this or any previous visit. Hip-L MR wo contrast: No results found for this or any previous visit. Hip-R CT w contrast: No results found for this or any previous visit. Hip-L CT w contrast: No results found for this or any previous visit. Hip-R CT w/wo contrast: No results found for this or any previous visit. Hip-L CT w/wo contrast: No results found for this or any previous visit. Hip-R CT wo contrast: No results found for this or any previous visit. Hip-L CT wo contrast: No results found for this or any previous visit. Hip-R DG 2-3 views: No results found for this or any previous visit. Hip-L DG 2-3 views: No results found for this or any previous visit. Hip-R DG Arthrogram: No results found for this or any previous visit. Hip-L DG Arthrogram: No results found for this or any previous visit. Hip-B DG Bilateral: No results found for this or any previous visit.  Knee Imaging: Knee-R MR w contrast: No results found for this or any previous visit. Knee-L MR w contrast: No results found for this or any previous visit. Knee-R MR w/wo contrast: No results found for this or any previous visit. Knee-L MR w/wo contrast: No results found for this or any previous visit. Knee-R MR wo contrast: No results found for this or any previous visit. Knee-L MR wo contrast: No results found for this or any previous visit. Knee-R CT w contrast: No results found for this or any previous visit. Knee-L CT w contrast: No results found for this or any previous visit. Knee-R CT w/wo contrast: No results found for this or any previous visit. Knee-L CT w/wo contrast: No results found for this or any  previous visit. Knee-R CT wo contrast: No results found for this or any previous visit. Knee-L CT wo contrast: No results found for this or any previous visit. Knee-R DG 1-2 views: No results found for this or any previous visit. Knee-L DG 1-2 views: No results found for this or any previous visit. Knee-R DG 3 views: No results found for this or any previous visit. Knee-L DG 3 views: No results found for this or any previous visit. Knee-R DG 4 views: No results found for this or any previous visit. Knee-L DG 4 views: No results found for this or any previous visit. Knee-R DG Arthrogram: No results found for this or any previous visit. Knee-L DG Arthrogram: No results found for this or any previous visit.  Ankle Imaging: Ankle-R DG Complete: No results found for this or any previous visit. Ankle-L DG Complete: No results found for this or any previous visit.  Foot Imaging: Foot-R DG Complete: No results found for this or any previous visit. Foot-L DG Complete: No results found for this or any previous visit.  Elbow Imaging: Elbow-R DG Complete: No results found for this or any previous visit. Elbow-L DG Complete: No results found for this or any previous visit.  Wrist Imaging: Wrist-R DG Complete:  Results for orders placed during the hospital encounter of 05/25/15  DG Wrist Complete Right   Narrative CLINICAL DATA:  Post reduction right wrist  EXAM: RIGHT WRIST - COMPLETE 3+ VIEW  COMPARISON:  May 25, 2015  FINDINGS: There is comminuted displaced intra-articular impacted fracture of distal radius in cast. The fracture is minimally less dorsal angulated relative to the precast films.  The previously question ulna styloid fracture and triquetrum fracture are not as well seen through cast.  IMPRESSION: Comminuted displaced intra-articular impacted fracture of distal radius in cast. The fracture is minimally less dorsal angulated relative to the precast  films.   Electronically Signed   By: Abelardo Diesel M.D.   On: 05/25/2015 15:11    Wrist-L DG Complete: No results found for this or any previous visit.  Hand Imaging: Hand-R DG Complete: No results found for this or any previous visit. Hand-L DG Complete: No results found for this or any previous visit.  Complexity Note: Imaging results reviewed. Results shared with Ms. Levandoski, using Layman's terms.                         ROS  Cardiovascular: {Hx; Cardiovascular History:210120525} Pulmonary or Respiratory: {Hx; Pumonary and/or Respiratory History:210120523} Neurological: {Hx; Neurological:210120504} Review of Past Neurological Studies:  Results for orders placed or performed during the hospital encounter of 12/04/17  MR Brain Wo Contrast   Narrative   CLINICAL DATA:  Dizziness and nausea beginning last night. History of hypertension, hypercholesterolemia, diabetes.  EXAM: MRI HEAD WITHOUT CONTRAST  TECHNIQUE: Multiplanar, multiecho pulse sequences of the brain and surrounding structures were obtained without intravenous contrast.  COMPARISON:  CT HEAD December 04, 2017 and MRI of the head November 11, 2016  FINDINGS: INTRACRANIAL CONTENTS: No reduced diffusion to suggest acute ischemia; faint T2 shine through frontal white matter. No susceptibility artifact to suggest hemorrhage. Old cerebellar, pontine, bilateral thalamus and LEFT greater than RIGHT basal ganglia infarcts. Slightly smaller LEFT cerebral peduncle compatible with wallerian degeneration. Moderate parenchymal brain volume loss. No hydrocephalus. Prominent basal ganglia and thalami perivascular spaces associated with chronic small vessel ischemic disease. Patchy to confluent supratentorial pontine white matter FLAIR T2 hyperintensities. No midline shift, mass effect or masses. No abnormal extra-axial fluid collections.  VASCULAR: Normal major intracranial vascular flow voids present at skull  base.  SKULL AND UPPER CERVICAL SPINE: No abnormal sellar expansion. No suspicious calvarial bone marrow signal. Craniocervical junction maintained.  SINUSES/ORBITS: The mastoid air-cells and included paranasal sinuses are well-aerated.The included ocular globes and orbital contents are non-suspicious. Status post bilateral ocular lens implants.  OTHER: None.  IMPRESSION: 1. No acute intracranial process. 2. 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 3. Moderate to severe chronic small vessel ischemic disease. 4. Moderate parenchymal brain volume loss.   Electronically Signed   By: Elon Alas M.D.   On: 12/04/2017 05:58   CT Head Wo Contrast   Narrative   CLINICAL DATA:  Acute onset of dizziness and nausea.  EXAM: CT HEAD WITHOUT CONTRAST  TECHNIQUE: Contiguous axial images were obtained from the base of the skull through the vertex without intravenous contrast.  COMPARISON:  CT of the head performed 11/11/2017  FINDINGS: Brain: No evidence of acute infarction, hemorrhage, hydrocephalus, extra-axial collection or mass lesion / mass effect.  Prominence of the ventricles and sulci reflects mild to moderate cortical volume loss. Cerebellar atrophy is noted. Scattered periventricular and subcortical matter change likely reflects small vessel ischemic microangiopathy.  The brainstem and fourth ventricle are within normal limits. The basal ganglia are unremarkable in appearance. The cerebral hemispheres demonstrate grossly normal gray-white differentiation. No mass effect or midline shift is seen.  Vascular: No hyperdense vessel or unexpected calcification.  Skull: There is no evidence of fracture; visualized osseous structures are unremarkable in appearance.  Sinuses/Orbits: The orbits are within normal limits.  The paranasal sinuses and mastoid air cells are well-aerated.  Other: No significant soft tissue  abnormalities are seen.  IMPRESSION: 1. No acute intracranial pathology seen on CT. 2. Mild to moderate cortical volume loss and scattered small vessel ischemic microangiopathy.   Electronically Signed   By: Garald Balding M.D.   On: 12/04/2017 03:09   Results for orders placed or performed during the hospital encounter of 11/11/17  MR BRAIN W WO CONTRAST   Narrative   CLINICAL DATA:  72 y/o F; few days of confusion and altered mental status.  EXAM: MRI HEAD WITHOUT AND WITH CONTRAST  TECHNIQUE: Multiplanar, multiecho pulse sequences of the brain and surrounding structures were obtained without and with intravenous contrast.  CONTRAST:  3m MULTIHANCE GADOBENATE DIMEGLUMINE 529 MG/ML IV SOLN  COMPARISON:  11/11/2017 CT head.  01/22/2010 MRI head.  FINDINGS: Brain: New small foci of diffusion hyperintensity of present with right frontal subcortical white matter, right frontal parietal centrum semiovale, and left posterior frontal subcortical white matter with intermediate ADC likely representing early subacute infarctions. No focus of reduced diffusion is present to indicate acute or early subacute infarction. No abnormal susceptibility hypointensity to indicate intracranial hemorrhage. No focal mass effect. No abnormal enhancement.  Multiple very small chronic infarcts in the cerebellum, chronic infarct within the pons, lacunar infarcts in bilateral thalami and left lentiform nucleus extending into corona radiata are stable. Background of moderate chronic microvascular ischemic changes of the brain and parenchymal volume loss is stable.  Vascular: Normal flow voids.  Skull and upper cervical spine: Normal marrow signal.  Sinuses/Orbits: Negative.  Other: None.  IMPRESSION: 1. Small early subacute infarctions in the bilateral frontal and right frontoparietal white matter. No associated hemorrhage or mass effect. 2. Stable background of moderate chronic  microvascular ischemic changes, parenchymal volume loss of the brain, and multiple small chronic infarctions in the basal ganglia, brainstem, and cerebellum.   Electronically Signed   By: LKristine GarbeM.D.   On: 11/11/2017 13:53   Results for orders placed or performed during the hospital encounter of 12/10/03  MR Angiogram Head Wo Contrast   Narrative   Clinical Data:  Severe vertigo when sitting up since this morning. MRI OF THE BRAIN WITHOUT AND WITH CONTRAST AND MRA HEAD WITHOUT Multiplanar T1- and T2-weighted images were obtained before and after the administration of 15 ml of Omniscan.  Sagittal T1-weighted images demonstrate an old midpontine stroke.  Axial T1-weighted images demonstrate old left basal ganglia, bilateral thalamic, and left pontine strokes.  Diffusion images show no acute stroke.  FLAIR images show scattered changes of small vessel disease.  T1-weighted images are unremarkable.  Following the administration of contrast, there is no abnormal intracranial enhancement.  IMPRESSION  1.  Atrophy and small vessel disease. 2.  Numerous bilateral lacunar infarcts.  3.  No acute stroke is seen.  4.  No abnormal intracranial enhancement.  MR ANGIOGRAPHY OF THE INTRACRANIAL CIRCULATION  3-D time of flight technique was performed through the vessels of the brain centered at the circle of Willis. The basilar artery is widely patent with the left vertebral the sole contributor.  Bilateral internal carotid artery siphon disease is present, worse on the right with an estimated 75 to 90 percent on the right and 50 to 75 percent on the left.  Good intracranial flow is identified.  There is no intracranial aneurysm seen.  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  See above comments.  Provider: CMargaretmary Eddy  Psychological-Psychiatric: {Hx; Psychological-Psychiatric History:210120512} Gastrointestinal: {Hx; Gastrointestinal:210120527} Genitourinary:  {Hx; Genitourinary:210120506} Hematological: {Hx; Hematological:210120510} Endocrine: {Hx; Endocrine history:210120509} Rheumatologic: {Hx; Rheumatological:210120530} Musculoskeletal: {Hx; Musculoskeletal:210120528} Work History: {Hx; Work history:210120514}  Allergies  Ms. Matsumoto is allergic to codeine; meperidine; metoclopramide hcl; oxycodone; stadol  [butorphanol]; tussionex pennkinetic er [hydrocod polst-cpm polst er]; and carbocaine  [mepivacaine hcl].  Laboratory Chemistry  Inflammation Markers (CRP: Acute Phase) (ESR: Chronic Phase) Lab Results  Component Value Date   LATICACIDVEN 1.2 11/11/2017                         Rheumatology Markers No results found for: RF, ANA, LABURIC, URICUR, LYMEIGGIGMAB, LYMEABIGMQN, HLAB27                      Renal Function Markers Lab Results  Component Value Date   BUN 25 (H) 12/04/2017   CREATININE 0.49 12/04/2017   GFRAA >60 12/04/2017   GFRNONAA >60 12/04/2017                             Hepatic Function Markers Lab Results  Component Value Date   AST 26 11/11/2017   ALT 19 11/11/2017   ALBUMIN 4.2 11/11/2017   ALKPHOS 72 11/11/2017   LIPASE 15 11/26/2016                        Electrolytes Lab Results  Component Value Date   NA 134 (L) 12/04/2017   K 3.5 12/04/2017   CL 99 (L) 12/04/2017   CALCIUM 9.3 12/04/2017                        Neuropathy Markers Lab Results  Component Value Date   HGBA1C 5.9 (H) 11/12/2017                        Bone Pathology Markers No results found for: VD25OH, CX448JE5UDJ, SH7026VZ8, HY8502DX4, 25OHVITD1, 25OHVITD2, 25OHVITD3, TESTOFREE, TESTOSTERONE                       Coagulation Parameters Lab Results  Component Value Date   INR 3.84 11/13/2017   LABPROT 37.5 (H) 11/13/2017   APTT 29 11/26/2016   PLT 208 12/04/2017                        Cardiovascular Markers Lab Results  Component Value Date   BNP 23.4 11/26/2016   CKTOTAL 100 11/26/2016   TROPONINI <0.03  12/04/2017   HGB 11.7 (L) 12/04/2017   HCT 34.0 (L) 12/04/2017                         CA Markers No results found for: CEA, CA125, LABCA2                      Note: Lab results reviewed.  PFSH  Drug: Ms. Boule  reports that she does not use drugs. Alcohol:  reports that she does not drink alcohol. Tobacco:  reports that she has never smoked. She has never used smokeless tobacco. Medical:  has a past medical history of At high risk for falls, Basal cell carcinoma, Complication of anesthesia, Diabetes mellitus without complication ([place]), Diabetic neuropathy ([place]), Diabetic retinopathy ([place]), Dizziness, Gastroparesis, Herniated disc, cervical, Hypercholesteremia, Hypertension,  Hypotensive episode, Hypothyroidism, Insulin pump in place, Myofascial muscle pain, Numbness of arm, Osteoporosis, Poor vision, Seizures ([place]), Slurred speech, Stroke (cerebrum) ([place]), Stroke ([place]), Vertigo, and Wears glasses. Family: family history is not on file.  Past Surgical History:  Procedure Laterality Date  . APPENDECTOMY    . AUGMENTATION MAMMAPLASTY Bilateral 2011  . BILATERAL CARPAL TUNNEL RELEASE    . COLONOSCOPY    . EYE SURGERY    . HEMORROIDECTOMY    . HERNIA REPAIR    . JOINT REPLACEMENT    . OPEN REDUCTION INTERNAL FIXATION (ORIF) DISTAL RADIAL FRACTURE Right 05/29/2015   Procedure: OPEN REDUCTION INTERNAL FIXATION (ORIF) RIGHT DISTAL RADIUS FRACTURE WITH ALLOGRAFT BONE GRAFTING FOR REPAIR AND RECONSTRUCTION;  Surgeon: Roseanne Kaufman, MD;  Location: [facility];  Service: Orthopedics;  Laterality: Right;  . SHOULDER ARTHROSCOPY Left   . SHOULDER HEMI-ARTHROPLASTY Left   . SYNOVECTOMY    . TONSILLECTOMY    . TRIGGER FINGER RELEASE    . TUBAL LIGATION     Active Ambulatory Problems    Diagnosis Date Noted  . Right radial fracture 05/29/2015  . Type 1 diabetes mellitus with complication ([place])   . Hyponatremia   . Essential hypertension   . Hyperlipidemia   . Depression   . Hypoglycemia  11/26/2016  . Hypokalemia   . Hypothermia   . Lactic acid acidosis   . Sepsis ([place])   . Confusion 11/11/2017  . DKA (diabetic ketoacidoses) ([place]) 11/12/2017  . Diabetic polyneuropathy associated with type 1 diabetes mellitus ([place]) 12/10/2017  . Numbness and tingling of both legs 12/10/2017  . Chronic pain syndrome 12/10/2017   Resolved Ambulatory Problems    Diagnosis Date Noted  . No Resolved Ambulatory Problems   Past Medical History:  Diagnosis Date  . At high risk for falls   . Basal cell carcinoma   . Complication of anesthesia   . Diabetes mellitus without complication ([place])   . Diabetic neuropathy ([place])   . Diabetic retinopathy ([place])   . Dizziness   . Gastroparesis   . Herniated disc, cervical   . Hypercholesteremia   . Hypertension   . Hypotensive episode   . Hypothyroidism   . Insulin pump in place   . Myofascial muscle pain   . Numbness of arm   . Osteoporosis   . Poor vision   . Seizures ([place])   . Slurred speech   . Stroke (cerebrum) ([place])   . Stroke ([place])   . Vertigo   . Wears glasses    Constitutional Exam  General appearance: Well nourished, well developed, and well hydrated. In no apparent acute distress Vitals:   03/05/18 1142  BP: (!) 144/78  Pulse: 67  Resp: 16  Temp: 98 F (36.7 C)  SpO2: 100%  Weight: 108 lb (49 kg)  Height: 5' 1"  (1.549 m)   BMI Assessment: Estimated body mass index is 20.41 kg/m as calculated from the following:   Height as of this encounter: 5' 1"  (1.549 m).   Weight as of this encounter: 108 lb (49 kg).  BMI interpretation table: BMI level Category Range association with higher incidence of chronic pain  <18 kg/m2 Underweight   18.5-24.9 kg/m2 Ideal body weight   25-29.9 kg/m2 Overweight Increased incidence by 20%  30-34.9 kg/m2 Obese (Class I) Increased incidence by 68%  35-39.9 kg/m2 Severe obesity (Class II) Increased incidence by 136%  >40 kg/m2 Extreme obesity (Class III) Increased incidence by 254%    Patient's current BMI Ideal  Body weight  Body mass index is 20.41 kg/m. Ideal body weight: 47.8 kg (105 lb 6.1 oz) Adjusted ideal body weight: 48.3 kg (106 lb 6.8 oz)   BMI Readings from Last 4 Encounters:  03/05/18 20.41 kg/m  12/10/17 19.89 kg/m  12/04/17 20.12 kg/m  11/13/17 20.28 kg/m   Wt Readings from Last 4 Encounters:  03/05/18 108 lb (49 kg)  12/10/17 107 lb (48.5 kg)  12/04/17 110 lb (49.9 kg)  11/13/17 110 lb 14.4 oz (50.3 kg)  Psych/Mental status: Alert, oriented x 3 (person, place, & time)       Eyes: PERLA Respiratory: No evidence of acute respiratory distress  Cervical Spine Area Exam  Skin & Axial Inspection: No masses, redness, edema, swelling, or associated skin lesions Alignment: Symmetrical Functional ROM: Unrestricted ROM      Stability: No instability detected Muscle Tone/Strength: Functionally intact. No obvious neuro-muscular anomalies detected. Sensory (Neurological): Unimpaired Palpation: No palpable anomalies              Upper Extremity (UE) Exam    Side: Right upper extremity  Side: Left upper extremity  Skin & Extremity Inspection: Skin color, temperature, and hair growth are WNL. No peripheral edema or cyanosis. No masses, redness, swelling, asymmetry, or associated skin lesions. No contractures.  Skin & Extremity Inspection: Skin color, temperature, and hair growth are WNL. No peripheral edema or cyanosis. No masses, redness, swelling, asymmetry, or associated skin lesions. No contractures.  Functional ROM: Unrestricted ROM          Functional ROM: Unrestricted ROM          Muscle Tone/Strength: Functionally intact. No obvious neuro-muscular anomalies detected.  Muscle Tone/Strength: Functionally intact. No obvious neuro-muscular anomalies detected.  Sensory (Neurological): Unimpaired          Sensory (Neurological): Unimpaired          Palpation: No palpable anomalies              Palpation: No palpable anomalies              Provocative  Test(s):  Phalen's test: deferred Tinel's test: deferred Apley's scratch test (touch opposite shoulder):  Action 1 (Across chest): deferred Action 2 (Overhead): deferred Action 3 (LB reach): deferred   Provocative Test(s):  Phalen's test: deferred Tinel's test: deferred Apley's scratch test (touch opposite shoulder):  Action 1 (Across chest): deferred Action 2 (Overhead): deferred Action 3 (LB reach): deferred    Thoracic Spine Area Exam  Skin & Axial Inspection: No masses, redness, or swelling Alignment: Symmetrical Functional ROM: Unrestricted ROM Stability: No instability detected Muscle Tone/Strength: Functionally intact. No obvious neuro-muscular anomalies detected. Sensory (Neurological): Unimpaired Muscle strength & Tone: No palpable anomalies  Lumbar Spine Area Exam  Skin & Axial Inspection: No masses, redness, or swelling Alignment: Symmetrical Functional ROM: Unrestricted ROM       Stability: No instability detected Muscle Tone/Strength: Functionally intact. No obvious neuro-muscular anomalies detected. Sensory (Neurological): Unimpaired Palpation: No palpable anomalies       Provocative Tests: Lumbar Hyperextension/rotation test: deferred today       Lumbar quadrant test (Kemp's test): deferred today       Lumbar Lateral bending test: deferred today       Patrick's Maneuver: deferred today                   FABER test: deferred today                   Thigh-thrust test:  deferred today       S-I compression test: deferred today       S-I distraction test: deferred today        Gait & Posture Assessment  Ambulation: Unassisted Gait: Relatively normal for age and body habitus Posture: WNL   Lower Extremity Exam    Side: Right lower extremity  Side: Left lower extremity  Stability: No instability observed          Stability: No instability observed          Skin & Extremity Inspection: Skin color, temperature, and hair growth are WNL. No peripheral edema or  cyanosis. No masses, redness, swelling, asymmetry, or associated skin lesions. No contractures.  Skin & Extremity Inspection: Skin color, temperature, and hair growth are WNL. No peripheral edema or cyanosis. No masses, redness, swelling, asymmetry, or associated skin lesions. No contractures.  Functional ROM: Unrestricted ROM                  Functional ROM: Unrestricted ROM                  Muscle Tone/Strength: Functionally intact. No obvious neuro-muscular anomalies detected.  Muscle Tone/Strength: Functionally intact. No obvious neuro-muscular anomalies detected.  Sensory (Neurological): Unimpaired  Sensory (Neurological): Unimpaired  Palpation: No palpable anomalies  Palpation: No palpable anomalies   Assessment  Primary Diagnosis & Pertinent Problem List: The primary encounter diagnosis was Diabetic polyneuropathy associated with type 1 diabetes mellitus ([place]). Diagnoses of Type 1 diabetes mellitus with complication (HCC), Numbness and tingling of both legs, and Chronic pain syndrome were also pertinent to this visit.  Visit Diagnosis (New problems to examiner): 1. 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 Type 1 diabetes mellitus with complication (HCC)   3. Numbness and tingling of both legs   4. Chronic pain syndrome    Plan of Care (Initial workup plan)  Note: Please be advised that as per protocol, today's visit has been an evaluation only. We have not taken over the patient's controlled substance management.  Problem-specific plan: No problem-specific Assessment & Plan notes found for this encounter.  Ordered Lab-work, Procedure(s), Referral(s), & Consult(s): No orders of the defined types were placed in this encounter.  Pharmacotherapy (current): Medications ordered:  No orders of the defined types were placed in this encounter.  Medications administered during this visit: Tamara Monteith. Ledvina had no medications administered during this  visit.   Pharmacological management options:  Opioid Analgesics: The patient was informed that there is no guarantee that she would be a candidate for opioid analgesics. The decision will be made following CDC guidelines. This decision will be based on the results of diagnostic studies, as well as Ms. Hino's risk profile.   Membrane stabilizer: To be determined at a later time  Muscle relaxant: To be determined at a later time  NSAID: To be determined at a later time  Other analgesic(s): To be determined at a later time   Interventional management options: Ms. Gaughran was informed that there is no guarantee that she would be a candidate for interventional therapies. The decision will be based on the results of diagnostic studies, as well as Ms. Roston's risk profile.  Procedure(s) under consideration:  ***   Provider-requested follow-up: Return in about 6 weeks (around 04/16/2018) for Medication Management.  Future Appointments  Date Time Provider [place]  04/11/2018  1:30 PM Gillis Santa, MD [facility] None    Primary Care Physician: Lovie Macadamia,  Shanon Brow, MD Location: [facility] Note by: Gillis Santa, M.D, Date: 03/05/2018; Time: 1:51 PM  Patient Instructions  Patient to increase gabapentin to 300/300/900 mg in the evening  1 in the morning, 1 in the evening, 3 before bed

## 2018-03-05 NOTE — Progress Notes (Signed)
Patient's Name: Erin Daniels  MRN: [id number]  Referring Provider: Juluis Pitch, MD  DOB: [birth year]  PCP: Juluis Pitch, MD  DOS: 03/05/2018  Note by: Gillis Santa, MD  Service setting: Ambulatory outpatient  Specialty: Interventional Pain Management  Location: [facility] (AMB) Pain Management Facility    Patient type: Established   Primary Reason(s) for Visit: Evaluation of chronic illnesses with exacerbation, or progression (Level of risk: moderate) CC: Back Pain and Foot Pain (bilateral)  HPI  Erin Daniels is a 72 y.o. year old, female patient, who comes today for a follow-up evaluation. She has Right radial fracture; Type 1 diabetes mellitus with complication ([place]); Hyponatremia; Essential hypertension; Hyperlipidemia; Depression; Hypoglycemia; Hypokalemia; Hypothermia; Lactic acid acidosis; Sepsis ([place]); Confusion; DKA (diabetic ketoacidoses) ([place]); Diabetic polyneuropathy associated with type 1 diabetes mellitus ([place]); Numbness and tingling of both legs; and Chronic pain syndrome on their problem list. [name] was last seen on 02/07/2018. Her primarily concern today is the Back Pain and Foot Pain (bilateral)  Pain Assessment: Location: Mid, Lower, Upper Back Radiating: denies Onset: More than a month ago Duration: Chronic pain Quality: Aching Severity: 4 /10 (subjective, self-reported pain score)  Note: Reported level is compatible with observation.                         When using our objective Pain Scale, levels between 6 and 10/10 are said to belong in an emergency room, as it progressively worsens from a 6/10, described as severely limiting, requiring emergency care not usually available at an outpatient pain management facility. At a 6/10 level, communication becomes difficult and requires great effort. Assistance to reach the emergency department may be required. Facial flushing and profuse sweating along with potentially dangerous increases in heart rate and blood  pressure will be evident. Effect on ADL: difficulty with activities Timing: Constant Modifying factors: medication BP: (!) 144/78  HR: 67  Further details on both, my assessment(s), as well as the proposed treatment plan, please see below.  Patient follows up today regarding clarification of medications and adjustment to Gabapentin. Overall, patient states she is obtaining some relief with Gabapentin at 300/300/900.  She denies any lower tremors swelling, sedation, vivid dreams.  Laboratory Chemistry  Inflammation Markers (CRP: Acute Phase) (ESR: Chronic Phase) Lab Results  Component Value Date   LATICACIDVEN 1.2 11/11/2017                         Rheumatology Markers No results found for: RF, ANA, LABURIC, URICUR, LYMEIGGIGMAB, LYMEABIGMQN, HLAB27                      Renal Function Markers Lab Results  Component Value Date   BUN 25 (H) 12/04/2017   CREATININE 0.49 12/04/2017   GFRAA >60 12/04/2017   GFRNONAA >60 12/04/2017                             Hepatic Function Markers Lab Results  Component Value Date   AST 26 11/11/2017   ALT 19 11/11/2017   ALBUMIN 4.2 11/11/2017   ALKPHOS 72 11/11/2017   LIPASE 15 11/26/2016                        Electrolytes Lab Results  Component Value Date   NA 134 (L) 12/04/2017   K 3.5 12/04/2017   CL 99 (  L) 12/04/2017   CALCIUM 9.3 12/04/2017                        Neuropathy Markers Lab Results  Component Value Date   HGBA1C 5.9 (H) 11/12/2017                        Bone Pathology Markers No results found for: VD25OH, [id number], [id number], XF8182XH3, 25OHVITD1, 25OHVITD2, 25OHVITD3, TESTOFREE, TESTOSTERONE                       Coagulation Parameters Lab Results  Component Value Date   INR 3.84 11/13/2017   LABPROT 37.5 (H) 11/13/2017   APTT 29 11/26/2016   PLT 208 12/04/2017                        Cardiovascular Markers Lab Results  Component Value Date   BNP 23.4 11/26/2016   CKTOTAL 100 11/26/2016    TROPONINI <0.03 12/04/2017   HGB 11.7 (L) 12/04/2017   HCT 34.0 (L) 12/04/2017                         CA Markers No results found for: CEA, CA125, LABCA2                      Note: Lab results reviewed.  Recent Diagnostic Imaging Review   Cervical CT wo contrast:  Results for orders placed during the hospital encounter of 05/25/15  CT Cervical Spine Wo Contrast   Narrative CLINICAL DATA:  Pain following fall  EXAM: CT HEAD WITHOUT CONTRAST  CT MAXILLOFACIAL WITHOUT CONTRAST  CT CERVICAL SPINE WITHOUT CONTRAST  TECHNIQUE: Multidetector CT imaging of the head, cervical spine, and maxillofacial structures were performed using the standard protocol without intravenous contrast. Multiplanar CT image reconstructions of the cervical spine and maxillofacial structures were also generated.  COMPARISON:  Head CT December 01, 2014; cervical MRI June 25, 2013  FINDINGS: CT HEAD FINDINGS  There is mild diffuse atrophy. There is no intracranial mass hemorrhage, extra-axial fluid collection, or midline shift. There is patchy small vessel disease in the centra semiovale bilaterally. 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 There is small vessel disease in the anterior limb of the left internal capsule, stable as well. Small lacunar infarcts are noted in each thalamus. There is evidence of a small lacunar infarct in the mid left cerebellum, stable. There is small vessel disease in the lower pons bilaterally, stable. There is no new gray-white compartment lesion. No acute infarct evident. The bony calvarium appears intact. The mastoid air cells are clear.  CT MAXILLOFACIAL FINDINGS  There is a slightly displaced fracture of the left mid porch nasal bone. A tiny avulsion off the distal aspect of the nasion is also noted. There is a nondisplaced fracture along the lateral aspect of the anterior left maxillary antrum. There is a  subtle nondisplaced fracture of the lateral left maxillary antrum. No other fractures are identified. No dislocation.  There is soft tissue swelling over the left side of the face. There is no intraorbital lesion.  There is a small air-fluid level in the right maxillary antrum. The other paranasal sinuses are clear. The ostiomeatal unit complexes are patent bilaterally. There is slight leftward deviation the nasal septum. There is no nares obstruction.  Salivary glands appear symmetric and normal bilaterally. No adenopathy.  CT CERVICAL SPINE FINDINGS  There is no demonstrable fracture. There is minimal anterolisthesis of C3 on C4. There is minimal retrolisthesis of C4 on C5. There is minimal retrolisthesis of C5 on C6. No other spondylolisthesis. Prevertebral soft tissues and predental space regions are normal. There is moderately severe disc space narrowing at C4-5 and C5-6. There is moderate narrowing at C3-4 and C6-7. There is multilevel facet hypertrophy. No frank disc extrusion or stenosis. There is moderate exit foraminal narrowing at C4-5 and C5-6 bilaterally. No frank disc extrusion or stenosis. Bones are somewhat osteoporotic. There is calcification in each carotid artery.  IMPRESSION: CT head: Atrophy with prior small infarcts and small vessel disease, both supratentorial and infratentorial. No acute infarct evident. No hemorrhage or mass effect. No extra-axial fluid collections.  CT maxillofacial: Slightly displaced fracture mid portion left nasal bone. Rather subtle fracture along the lateral aspect of the anterior left maxillary antrum. There is also a subtle nondisplaced fracture of the lateral left maxillary antrum. There is a tiny avulsion along the distal most aspect of the nasion midline. No dislocations. Small air-fluid level right maxillary antrum. Other paranasal sinuses clear. The ostia BB complexes are patent bilaterally. There is mild leftward  deviation of the nasal septum.  CT cervical spine: No fracture. Slight spondylolisthesis at several levels is felt to be due to underlying spondylosis. 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 No disc extrusion or stenosis apparent. Bones osteoporotic. Calcification in each carotid artery.   Electronically Signed   By: Lowella Grip III M.D.   On: 05/25/2015 12:42     Complexity Note: Imaging results reviewed. Results shared with Ms. Risser, using Layman's terms.                         Meds   Current Outpatient Medications:  .  aspirin 325 MG EC tablet, Take 325 mg by mouth daily with breakfast., Disp: , Rfl:  .  atorvastatin (LIPITOR) 40 MG tablet, Take 1 tablet (40 mg total) by mouth daily at 6 PM., Disp: 30 tablet, Rfl: 1 .  butalbital-acetaminophen-caffeine (FIORICET, ESGIC) 50-325-40 MG tablet, Take 0.5-1 tablets by mouth 2 (two) times daily as needed for headache., Disp: 60 tablet, Rfl: 5 .  Calcium Carb-Cholecalciferol (CALCIUM 600 + D PO), Take 1 tablet by mouth every evening., Disp: , Rfl:  .  carboxymethylcellulose (REFRESH PLUS) 0.5 % SOLN, Take 1-2 drops by mouth as needed (dry eyes). , Disp: , Rfl:  .  gabapentin (NEURONTIN) 300 MG capsule, Take 300 mg by mouth. 1 in the am, 1 at lunch and 2 at bedtime, Disp: , Rfl:  .  glucose 4 GM chewable tablet, Chew 1 tablet by mouth as needed for low blood sugar., Disp: , Rfl:  .  Insulin Human (INSULIN PUMP) SOLN, Inject 0.4-4 each into the skin continuous. Uses Novolog. ( pt states she takes 20 units cont through out day), Disp: , Rfl:  .  IRON, FERROUS GLUCONATE, PO, Take 1 tablet by mouth every evening., Disp: , Rfl:  .  losartan (COZAAR) 100 MG tablet, Take 50 mg by mouth every evening. , Disp: , Rfl:  .  meclizine (ANTIVERT) 12.5 MG tablet, Take 1 tablet (12.5 mg total) by mouth 3 (three) times daily as needed for dizziness., Disp: 12 tablet, Rfl: 0 .  Multiple Vitamin (MULTIVITAMIN WITH MINERALS) TABS  tablet, Take 1 tablet by mouth every evening.,  Disp: , Rfl:  .  Polyethyl Glycol-Propyl Glycol (SYSTANE) 0.4-0.3 % SOLN, Apply 1 drop to eye as needed (dry eyes)., Disp: , Rfl:  .  polyethylene glycol (MIRALAX / GLYCOLAX) packet, Take 17 g by mouth every other day., Disp: , Rfl:  .  promethazine (PHENERGAN) 25 MG suppository, Place 25 mg rectally every 6 (six) hours as needed for nausea or vomiting., Disp: , Rfl:  .  venlafaxine (EFFEXOR) 75 MG tablet, Take 75 mg by mouth 2 (two) times daily., Disp: , Rfl:  .  vitamin C (ASCORBIC ACID) 500 MG tablet, Take 500 mg by mouth 2 (two) times daily., Disp: , Rfl:  .  warfarin (COUMADIN) 5 MG tablet, Take 7.5 mg by mouth daily at 6 PM. , Disp: , Rfl:   ROS  Constitutional: Denies any fever or chills Gastrointestinal: No reported hemesis, hematochezia, vomiting, or acute GI distress Musculoskeletal: Denies any acute onset joint swelling, redness, loss of ROM, or weakness Neurological: No reported episodes of acute onset apraxia, aphasia, dysarthria, agnosia, amnesia, paralysis, loss of coordination, or loss of consciousness  Allergies  Ms. Branscom is allergic to codeine; meperidine; metoclopramide hcl; oxycodone; stadol  [butorphanol]; tussionex pennkinetic er [hydrocod polst-cpm polst er]; and carbocaine  [mepivacaine hcl].  PFSH  Drug: Ms. Prichett  reports that she does not use drugs. Alcohol:  reports that she does not drink alcohol. Tobacco:  reports that she has never smoked. She has never used smokeless tobacco. Medical:  has a past medical history of At high risk for falls, Basal cell carcinoma, Complication of anesthesia, Diabetes mellitus without complication ([place]), Diabetic neuropathy ([place]), Diabetic retinopathy ([place]), Dizziness, Gastroparesis, Herniated disc, cervical, Hypercholesteremia, Hypertension, Hypotensive episode, Hypothyroidism, Insulin pump in place, Myofascial muscle pain, Numbness of arm, Osteoporosis, Poor vision, Seizures  ([place]), Slurred speech, Stroke (cerebrum) ([place]), Stroke ([place]), Vertigo, and Wears glasses. Surgical: Ms. Methot  has a past surgical history that includes Tonsillectomy; Hemorroidectomy; Hernia repair; Joint replacement; Appendectomy; Tubal ligation; Eye surgery; Trigger finger release; Bilateral carpal tunnel release; Synovectomy; Colonoscopy; Shoulder arthroscopy (Left); Shoulder hemi-arthroplasty (Left); Open reduction internal fixation (orif) distal radial fracture (Right, 05/29/2015); and Augmentation mammaplasty (Bilateral, 2011). Family: family history is not on file.  Constitutional Exam  General appearance: Well nourished, well developed, and well hydrated. In no apparent acute distress Vitals:   03/05/18 1142  BP: (!) 144/78  Pulse: 67  Resp: 16  Temp: 98 F (36.7 C)  SpO2: 100%  Weight: 108 lb (49 kg)  Height: 5' 1"  (1.549 m)   BMI Assessment: Estimated body mass index is 20.41 kg/m as calculated from the following:   Height as of this encounter: 5' 1"  (1.549 m).   Weight as of this encounter: 108 lb (49 kg).  BMI interpretation table: BMI level Category Range association with higher incidence of chronic pain  <18 kg/m2 Underweight   18.5-24.9 kg/m2 Ideal body weight   25-29.9 kg/m2 Overweight Increased incidence by 20%  30-34.9 kg/m2 Obese (Class I) Increased incidence by 68%  35-39.9 kg/m2 Severe obesity (Class II) Increased incidence by 136%  >40 kg/m2 Extreme obesity (Class III) Increased incidence by 254%   Patient's current BMI Ideal Body weight  Body mass index is 20.41 kg/m. Ideal body weight: 47.8 kg (105 lb 6.1 oz) Adjusted ideal body weight: 48.3 kg (106 lb 6.8 oz)   BMI Readings from Last 4 Encounters:  03/05/18 20.41 kg/m  12/10/17 19.89 kg/m  12/04/17 20.12 kg/m  11/13/17 20.28 kg/m   Wt Readings from Last  4 Encounters:  03/05/18 108 lb (49 kg)  12/10/17 107 lb (48.5 kg)  12/04/17 110 lb (49.9 kg)  11/13/17 110 lb 14.4 oz (50.3 kg)   Psych/Mental status: Alert, oriented x 3 (person, place, & time)       Eyes: PERLA Respiratory: No evidence of acute respiratory distress  Cervical Spine Area Exam  Skin & Axial Inspection: No masses, redness, edema, swelling, or associated skin lesions Alignment: Symmetrical Functional ROM: Unrestricted ROM      Stability: No instability detected Muscle Tone/Strength: Functionally intact. No obvious neuro-muscular anomalies detected. Sensory (Neurological): Unimpaired Palpation: No palpable anomalies              Upper Extremity (UE) Exam    Side: Right upper extremity  Side: Left upper extremity  Skin & Extremity Inspection: Skin color, temperature, and hair growth are WNL. No peripheral edema or cyanosis. No masses, redness, swelling, asymmetry, or associated skin lesions. No contractures.  Skin & Extremity Inspection: Skin color, temperature, and hair growth are WNL. No peripheral edema or cyanosis. No masses, redness, swelling, asymmetry, or associated skin lesions. No contractures.  Functional ROM: Unrestricted ROM          Functional ROM: Unrestricted ROM          Muscle Tone/Strength: Functionally intact. No obvious neuro-muscular anomalies detected.  Muscle Tone/Strength: Functionally intact. No obvious neuro-muscular anomalies detected.  Sensory (Neurological): Unimpaired          Sensory (Neurological): Unimpaired          Palpation: No palpable anomalies              Palpation: No palpable anomalies              Provocative Test(s):  Phalen's test: deferred Tinel's test: deferred Apley's scratch test (touch opposite shoulder):  Action 1 (Across chest): deferred Action 2 (Overhead): deferred Action 3 (LB reach): deferred   Provocative Test(s):  Phalen's test: deferred Tinel's test: deferred Apley's scratch test (touch opposite shoulder):  Action 1 (Across chest): deferred Action 2 (Overhead): deferred Action 3 (LB reach): deferred    Thoracic Spine Area Exam  Skin &  Axial Inspection: No masses, redness, or swelling Alignment: Symmetrical Functional ROM: Unrestricted ROM Stability: No instability detected Muscle Tone/Strength: Functionally intact. No obvious neuro-muscular anomalies detected. Sensory (Neurological): Unimpaired Muscle strength & Tone: No palpable anomalies  Lumbar Spine Area Exam  Skin & Axial Inspection: No masses, redness, or swelling Alignment: Symmetrical Functional ROM: Unrestricted ROM       Stability: No instability detected Muscle Tone/Strength: Functionally intact. No obvious neuro-muscular anomalies detected. Sensory (Neurological): Unimpaired Palpation: No palpable anomalies       Provocative Tests: Lumbar Hyperextension/rotation test: deferred today       Lumbar quadrant test (Kemp's test): deferred today       Lumbar Lateral bending test: deferred today       Patrick's Maneuver: deferred today                   FABER test: deferred today                   Thigh-thrust test: deferred today       S-I compression test: deferred today       S-I distraction test: deferred today        Gait & Posture Assessment  Ambulation: Unassisted Gait: Relatively normal for age and body habitus Posture: WNL   Lower Extremity Exam    Side:  Right lower extremity  Side: Left lower extremity  Stability: No instability observed          Stability: No instability observed          Skin & Extremity Inspection: Skin color, temperature, and hair growth are WNL. No peripheral edema or cyanosis. No masses, redness, swelling, asymmetry, or associated skin lesions. No contractures.  Skin & Extremity Inspection: Skin color, temperature, and hair growth are WNL. No peripheral edema or cyanosis. No masses, redness, swelling, asymmetry, or associated skin lesions. No contractures.  Functional ROM: Unrestricted ROM                  Functional ROM: Unrestricted ROM                  Muscle Tone/Strength: Functionally intact. No obvious neuro-muscular  anomalies detected.  Muscle Tone/Strength: Functionally intact. No obvious neuro-muscular anomalies detected.  Sensory (Neurological): Paresthesia (Tingling sensation)  Sensory (Neurological): Paresthesia (Tingling sensation)  Palpation: No palpable anomalies  Palpation: No palpable anomalies   Assessment  Primary Diagnosis & Pertinent Problem List: The primary encounter diagnosis was Diabetic polyneuropathy associated with type 1 diabetes mellitus ([place]). Diagnoses of Type 1 diabetes mellitus with complication ([facility]), Numbness and tingling of both legs, and Chronic pain syndrome were also pertinent to this visit.  Status Diagnosis  Controlled Controlled Controlled 1. Diabetic polyneuropathy associated with type 1 diabetes mellitus ([place])   2. Type 1 diabetes mellitus with complication ([facility])   3. Numbness and tingling of both legs   4. Chronic pain syndrome      General Recommendations: The pain condition that the patient suffers from is best treated with a multidisciplinary approach that involves an increase in physical activity to prevent de-conditioning and worsening of the pain cycle, as well as psychological counseling (formal and/or informal) to address the co-morbid psychological affects of pain. Treatment will often involve judicious use of pain medications and interventional procedures to decrease the pain, allowing the patient to participate in the physical activity that will ultimately produce long-lasting pain reductions. The goal of the multidisciplinary approach is to return the patient to a higher level of overall function and to restore their ability to perform activities of daily living.  72 year old female with a history of prior CVA who presents with lower extremity paresthesias most pronounced in her ankles and feet secondary to diabetic polyneuropathy from type 1 diabetes. Patient has been dealing with neuropathy for greater than 20 years. She has an insulin pump in place.   Patient follows up today after she sustained stroke.  She has been recovering from this.  At her last visit, all his sedating medications including her neuropathic's were discontinued given her sedation and dizziness at the time.  Today the patient is doing better and she states that she started her gabapentin a couple of weeks ago.  She is currently taking 300 mg in the morning, 300 mg in the afternoon, 600 mg nightly.  She does not experience any side effects of sedation, lower extremity swelling or dizziness with this medication.  She has not experienced any falls.  I think it is reasonable to increase her nighttime dose of 900 mg nightly and keep the morning dose and the afternoon dose the same.  This will equal a total daily dose of gabapentin 1500 mg.  Plan: Increase Gabapentin to 300 mg/300 mg/ 900 mg qhs  If gabapentin is not effective for neuropathic pain symptoms, can consider low-dose amitriptyline, 25 mg nightly.  Time Note: Greater than 50% of the 15 minute(s) of face-to-face time spent with Ms. Vasudevan, was spent in counseling/coordination of care regarding: Ms. Justiss's primary cause of pain, the treatment plan, treatment alternatives, the risks and possible complications of proposed treatment and realistic expectations.  Provider-requested follow-up: Return in about 6 weeks (around 04/16/2018) for Medication Management.  Future Appointments  Date Time Provider [place]  04/11/2018  1:30 PM Gillis Santa, MD [facility] None    Primary Care Physician: Juluis Pitch, MD Location: [facility] Note by: Gillis Santa, M.D Date: 03/05/2018; Time: 2:12 PM  Patient Instructions  Patient to increase gabapentin to 300/300/900 mg in the evening  1 in the morning, 1 in the evening, 3 before bed

## 2018-03-05 NOTE — Progress Notes (Signed)
Safety precautions to be maintained throughout the outpatient stay will include: orient to surroundings, keep bed in low position, maintain call bell within reach at all times, provide assistance with transfer out of bed and ambulation.  

## 2018-03-27 ENCOUNTER — Telehealth: Payer: Self-pay | Admitting: *Deleted

## 2018-03-27 MED ORDER — GABAPENTIN 300 MG PO CAPS
ORAL_CAPSULE | ORAL | 3 refills | Status: DC
Start: 2018-03-27 — End: 2018-04-11

## 2018-03-27 NOTE — Telephone Encounter (Signed)
Patient advised Gabapentin has been sent to [facility]

## 2018-03-27 NOTE — Telephone Encounter (Signed)
Patient called again at 9:39 to request med refill be called to [facility] for gabapentin

## 2018-03-27 NOTE — Telephone Encounter (Signed)
Dr. Holley Raring, I don't see that you have prescribed this for her. She gets it from another provider. Her next appt is 04-11-18. What would you like for me to tell her about this?

## 2018-03-31 ENCOUNTER — Encounter: Payer: Self-pay | Admitting: Emergency Medicine

## 2018-03-31 ENCOUNTER — Emergency Department
Admission: EM | Admit: 2018-03-31 | Discharge: 2018-03-31 | Disposition: A | Discharge status: Home / Self Care | Payer: Medicare Other | Attending: Emergency Medicine | Admitting: Emergency Medicine

## 2018-03-31 ENCOUNTER — Other Ambulatory Visit: Payer: Self-pay

## 2018-03-31 DIAGNOSIS — Z7982 Long term (current) use of aspirin: Secondary | ICD-10-CM | POA: Insufficient documentation

## 2018-03-31 DIAGNOSIS — E1040 Type 1 diabetes mellitus with diabetic neuropathy, unspecified: Secondary | ICD-10-CM | POA: Insufficient documentation

## 2018-03-31 DIAGNOSIS — R42 Dizziness and giddiness: Secondary | ICD-10-CM

## 2018-03-31 DIAGNOSIS — Z79899 Other long term (current) drug therapy: Secondary | ICD-10-CM | POA: Insufficient documentation

## 2018-03-31 DIAGNOSIS — E039 Hypothyroidism, unspecified: Secondary | ICD-10-CM | POA: Insufficient documentation

## 2018-03-31 DIAGNOSIS — I10 Essential (primary) hypertension: Secondary | ICD-10-CM | POA: Diagnosis not present

## 2018-03-31 DIAGNOSIS — E785 Hyperlipidemia, unspecified: Secondary | ICD-10-CM | POA: Diagnosis not present

## 2018-03-31 DIAGNOSIS — Z8673 Personal history of transient ischemic attack (TIA), and cerebral infarction without residual deficits: Secondary | ICD-10-CM | POA: Insufficient documentation

## 2018-03-31 DIAGNOSIS — Z794 Long term (current) use of insulin: Secondary | ICD-10-CM | POA: Insufficient documentation

## 2018-03-31 LAB — BASIC METABOLIC PANEL
Anion gap: 4 — ABNORMAL LOW (ref 5–15)
BUN: 21 mg/dL (ref 8–23)
CHLORIDE: 104 mmol/L (ref 98–111)
CO2: 33 mmol/L — AB (ref 22–32)
CREATININE: 0.54 mg/dL (ref 0.44–1.00)
Calcium: 9.4 mg/dL (ref 8.9–10.3)
GFR calc Af Amer: >60 mL/min (ref >60)
GFR calc non Af Amer: >60 mL/min (ref >60)
GLUCOSE: 114 mg/dL — AB (ref 70–99)
Potassium: 3.7 mmol/L (ref 3.5–5.1)
SODIUM: 141 mmol/L (ref 135–145)

## 2018-03-31 LAB — CBC
HEMATOCRIT: 35.1 % (ref 35.0–47.0)
HEMOGLOBIN: 12.1 g/dL (ref 12.0–16.0)
MCH: 32.5 pg (ref 26.0–34.0)
MCHC: 34.7 g/dL (ref 32.0–36.0)
MCV: 93.7 fL (ref 80.0–100.0)
Platelets: 216 10*3/uL (ref 150–440)
RBC: 3.74 MIL/uL — ABNORMAL LOW (ref 3.80–5.20)
RDW: 12.2 % (ref 11.5–14.5)
WBC: 5.5 10*3/uL (ref 3.6–11.0)

## 2018-03-31 LAB — URINALYSIS, COMPLETE (UACMP) WITH MICROSCOPIC
BACTERIA UA: NONE SEEN
BILIRUBIN URINE: NEGATIVE
Glucose, UA: NEGATIVE mg/dL
Hgb urine dipstick: NEGATIVE
Ketones, ur: NEGATIVE mg/dL
LEUKOCYTES UA: NEGATIVE
Nitrite: NEGATIVE
PH: 7 (ref 5.0–8.0)
PROTEIN: NEGATIVE mg/dL
Specific Gravity, Urine: 1.005 (ref 1.005–1.030)
Squamous Epithelial / LPF: NONE SEEN (ref 0–5)

## 2018-03-31 LAB — URINE DRUG SCREEN, QUALITATIVE (ARMC ONLY)
Amphetamines, Ur Screen: NOT DETECTED
BARBITURATES, UR SCREEN: POSITIVE — AB
CANNABINOID 50 NG, UR ~~LOC~~: NOT DETECTED
COCAINE METABOLITE, UR ~~LOC~~: NOT DETECTED
MDMA (ECSTASY) UR SCREEN: NOT DETECTED
Methadone Scn, Ur: NOT DETECTED
Opiate, Ur Screen: NOT DETECTED
Phencyclidine (PCP) Ur S: NOT DETECTED
Tricyclic, Ur Screen: NOT DETECTED

## 2018-03-31 LAB — GLUCOSE, CAPILLARY
GLUCOSE-CAPILLARY: 38 mg/dL — AB (ref 70–99)
Glucose-Capillary: 99 mg/dL (ref 70–99)
Glucose-Capillary: 72 mg/dL (ref 70–99)

## 2018-03-31 LAB — ETHANOL

## 2018-03-31 MED ORDER — SODIUM CHLORIDE 0.9 % IV BOLUS
1000.0000 mL | Freq: Once | INTRAVENOUS | Status: AC
  Administered 2018-03-31: 1000 mL via INTRAVENOUS

## 2018-03-31 NOTE — ED Triage Notes (Signed)
Pt to ED via POV with sister who states that pt has been dizzy and sleepy today. Pt sister states that pt was admitted recently for a stroke, pt had similar symptoms at that time. Pt sister states that she first noticed the symptoms around 1130. Pt states that it feels like the room is spinning, pt denies headache. No facial droop noted, grips equal bilaterally. Pt appears drowsy at this time.

## 2018-03-31 NOTE — Discharge Instructions (Addendum)
Your workup in the Emergency Department today was reassuring.  We did not find any specific abnormalities, although you declined getting another MRI today.  We recommend you drink plenty of fluids, take your regular medications and/or any new ones prescribed today, and follow up with the doctor(s) listed in these documents as recommended.  Return to the Emergency Department if you develop new or worsening symptoms that concern you.

## 2018-03-31 NOTE — ED Notes (Signed)
Pt states she just feels weak. When moving limbs pt is able to maintain control and hold limbs steady for assessments without drift, but limbs appear to be heavy with movements and she lets them drop to bed as if holding a weight. Pt appears drowsy/sleepy during assessment.

## 2018-03-31 NOTE — ED Notes (Signed)
MD made aware of pt CBG of 38. Pt given OJ, [place] sandwich, and peanut butter.

## 2018-03-31 NOTE — ED Provider Notes (Signed)
[facility] Emergency Department Provider Note  ____________________________________________   First MD Initiated Contact with Patient 03/31/18 1545     (approximate)  I have reviewed the triage vital signs and the nursing notes.   HISTORY  Chief Complaint Dizziness    HPI Erin Daniels is a 72 y.o. 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  She presents with her sister for evaluation of acute onset lightheadedness and fatigue.  Her husband has been in the hospital for the last week and she has been traveling back and forth from their home and extended distance away at least once if not twice a day to see him.  She says that today at about noon after eating lunch she felt acutely lightheaded which has persisted as well as very fatigued.  She says that she just wants to sleep.  She denies nausea and vomiting.  She has very poor vision at baseline due to a lifetime of type 1 diabetes but she does not seem to have any visual complications.  Her sister says that she always has memory issues and they do not seem to be worse than usual but they were worried about the lethargy because this was similar to how she presented in March 2019 with her original frontal lobe infarctions.  They both report that the patient has been compliant with her medications but she has not been sleeping well recently and has been under a great deal of stress due to the fact that her husband is in the hospital.  They describe the symptoms as severe and nothing particular makes it better or worse but she still feels better than she did in March.  She denies any weakness in her extremities, just says that she lacks the energy to lift them up or to move around very much.  She denies fever/chills, chest pain, shortness of  breath, nausea, vomiting, abdominal pain, and dysuria.  She has chronic diabetic polyneuropathy for which she sees Dr. Holley Raring at the pain clinic.  She takes no opiates nor benzodiazepines.  She is on gabapentin as well as other medications as listed below but they specifically took her off of any potential sedating medications after her last hospitalization for CVA.  Past Medical History:  Diagnosis Date  . At high risk for falls   . Basal cell carcinoma    right upper leg  . Complication of anesthesia    difficult to arouse  . Diabetes mellitus without complication (HCC)    Type I  . Diabetic neuropathy ([place])   . Diabetic retinopathy ([place])   . Dizziness   . Gastroparesis   . Herniated disc, cervical   . Hypercholesteremia   . Hypertension   . Hypotensive episode   . Hypothyroidism   . Insulin pump in place   . Myofascial muscle pain   . Numbness of arm    left  . Osteoporosis   . Poor vision   . Seizures ([place])   . Slurred speech   . Stroke (cerebrum) ([place])    4/19  . Stroke (HCC)    x5  . Vertigo   . Wears glasses     Patient Active Problem List   Diagnosis Date Noted  . Diabetic polyneuropathy associated with type 1 diabetes mellitus ([place]) 12/10/2017  . Numbness and  tingling of both legs 12/10/2017  . Chronic pain syndrome 12/10/2017  . DKA (diabetic ketoacidoses) ([place]) 11/12/2017  . Confusion 11/11/2017  . Hypoglycemia 11/26/2016  . Hypokalemia   . Hypothermia   . Lactic acid acidosis   . Sepsis ([place])   . Type 1 diabetes mellitus with complication ([place])   . Hyponatremia   . Essential hypertension   . Hyperlipidemia   . Depression   . Right radial fracture 05/29/2015    Past Surgical History:  Procedure Laterality Date  . APPENDECTOMY    . AUGMENTATION MAMMAPLASTY Bilateral 2011  . BILATERAL CARPAL TUNNEL RELEASE    . COLONOSCOPY    . EYE SURGERY    . HEMORROIDECTOMY    . HERNIA REPAIR    . JOINT REPLACEMENT    . OPEN REDUCTION INTERNAL FIXATION  (ORIF) DISTAL RADIAL FRACTURE Right 05/29/2015   Procedure: OPEN REDUCTION INTERNAL FIXATION (ORIF) RIGHT DISTAL RADIUS FRACTURE WITH ALLOGRAFT BONE GRAFTING FOR REPAIR AND RECONSTRUCTION;  Surgeon: Roseanne Kaufman, MD;  Location: [facility];  Service: Orthopedics;  Laterality: Right;  . SHOULDER ARTHROSCOPY Left   . SHOULDER HEMI-ARTHROPLASTY Left   . SYNOVECTOMY    . TONSILLECTOMY    . TRIGGER FINGER RELEASE    . TUBAL LIGATION      Prior to Admission medications   Medication Sig Start Date End Date Taking? Authorizing Provider  aspirin 325 MG EC tablet Take 325 mg by mouth daily with breakfast.   Yes [provider]  atorvastatin (LIPITOR) 40 MG tablet Take 1 tablet (40 mg total) by mouth daily at 6 PM. 11/13/17  Yes Demetrios Loll, MD  Calcium Carb-Cholecalciferol (CALCIUM 600 + D PO) Take 1 tablet by mouth every evening.   Yes [provider]  gabapentin (NEURONTIN) 300 MG capsule 1 in the am, 1 at lunch and 2 at bedtime Patient taking differently: Take 300-600 mg by mouth 4 (four) times daily. 300MG -AM,  300MG -NOON,  600MG -PM 03/27/18  Yes Lateef, Carlus Pavlov, MD  Insulin Human (INSULIN PUMP) SOLN Inject 0.4-4 each into the skin continuous. Uses Novolog. ( pt states she takes 40 units cont through out day)   Yes [provider]  IRON, FERROUS GLUCONATE, PO Take 1 tablet by mouth every evening.   Yes [provider]  losartan (COZAAR) 50 MG tablet Take 50 mg by mouth daily.   Yes [provider]  Multiple Vitamin (MULTIVITAMIN WITH MINERALS) TABS tablet Take 1 tablet by mouth every evening.   Yes [provider]  venlafaxine (EFFEXOR) 75 MG tablet Take 75 mg by mouth 2 (two) times daily.   Yes [provider]  vitamin C (ASCORBIC ACID) 500 MG tablet Take 500 mg by mouth 2 (two) times daily.   Yes [provider]  warfarin (COUMADIN) 5 MG tablet Take 7.5 mg by mouth daily at 6 PM.    Yes [provider]    butalbital-acetaminophen-caffeine (FIORICET, ESGIC) 50-325-40 MG tablet Take 0.5-1 tablets by mouth 2 (two) times daily as needed for headache. 02/07/18   Gillis Santa, MD  glucose 4 GM chewable tablet Chew 1 tablet by mouth as needed for low blood sugar.    [provider]  meclizine (ANTIVERT) 12.5 MG tablet Take 1 tablet (12.5 mg total) by mouth 3 (three) times daily as needed for dizziness. Patient not taking: Reported on 03/31/2018 12/04/17   Merlyn Lot, MD  Polyethyl Glycol-Propyl Glycol (SYSTANE) 0.4-0.3 % SOLN Apply 1 drop to eye as needed (dry eyes).  [provider]  polyethylene glycol (MIRALAX / GLYCOLAX) packet Take 17 g by mouth every other day.    [provider]    Allergies Codeine; Meperidine; Metoclopramide hcl; Oxycodone; Stadol  [butorphanol]; Tussionex pennkinetic er [employer] polst-cpm polst er]; and Carbocaine  [mepivacaine hcl]  No family history on file.  Social History Social History   Tobacco Use  . Smoking status: Never Smoker  . Smokeless tobacco: Never Used  Substance Use Topics  . Alcohol use: No  . Drug use: No    Review of Systems Constitutional: No fever/chills.  General lethargy and malaise Eyes: No visual changes. ENT: No sore throat. Cardiovascular: Denies chest pain. Respiratory: Denies shortness of breath. Gastrointestinal: No abdominal pain.  No nausea, no vomiting.  No diarrhea.  No constipation. Genitourinary: Negative for dysuria. Musculoskeletal: Negative for neck pain.  Negative for back pain. Integumentary: Negative for rash. Neurological: Generalized weakness and lightheadedness.  Negative for headaches, focal weakness or numbness.   ____________________________________________   PHYSICAL EXAM:  VITAL SIGNS: ED Triage Vitals  Enc Vitals Group     BP 03/31/18 1502 129/65     Pulse Rate 03/31/18 1502 64     Resp 03/31/18 1502 16     Temp 03/31/18 1502 98.5 F (36.9 C)     Temp src --       SpO2 03/31/18 1502 98 %     Weight 03/31/18 1503 49 kg (108 lb 0.4 oz)     Height --      Head Circumference --      Peak Flow --      Pain Score 03/31/18 1503 0     Pain Loc --      Pain Edu? --      Excl. in [place]? --     Constitutional: Alert and oriented.  No acute distress but does appear tired and somewhat frustrated. Eyes: Conjunctivae are normal. PERRL. EOMI. no nystagmus Head: Atraumatic. Nose: No congestion/rhinnorhea. Mouth/Throat: Mucous membranes are moist. Neck: No stridor.  No meningeal signs.   Cardiovascular: Normal rate, regular rhythm. Good peripheral circulation. Grossly normal heart sounds. Respiratory: Normal respiratory effort.  No retractions. Lungs CTAB. Gastrointestinal: Soft and nontender. No distention.  Musculoskeletal: No lower extremity tenderness nor edema. No gross deformities of extremities. Neurologic:  Normal speech and language. No gross focal neurologic deficits are appreciated.  She appears to have a relatively limited amount of effort to participate in the neurological exam but when she does try she has good grip strength bilaterally and good muscle tone throughout her upper and lower extremities.  There is no evidence of any facial droop or other obvious cranial nerve deficit.  She does seem to be slightly slurring her speech but is unclear if this is due to fatigue or another reason. Skin:  Skin is warm, dry and intact. No rash noted. Psychiatric: Mood and affect are normal. Speech and behavior are normal.  ____________________________________________   LABS (all labs ordered are listed, but only abnormal results are displayed)  Labs Reviewed  BASIC METABOLIC PANEL - Abnormal; Notable for the following components:      Result Value   CO2 33 (*)    Glucose, Bld 114 (*)    Anion gap 4 (*)    All other components within normal limits  CBC - Abnormal; Notable for the following components:   RBC 3.74 (*)    All other components within  normal limits  URINALYSIS, COMPLETE (UACMP) WITH MICROSCOPIC - Abnormal; Notable  for the following components:   Color, Urine STRAW (*)    APPearance CLEAR (*)    All other components within normal limits  URINE DRUG SCREEN, QUALITATIVE ([facility] ONLY) - Abnormal; Notable for the following components:   Barbiturates, Ur Screen POSITIVE (*)    Benzodiazepine, Ur Scrn TEST NOT PERFORMED, REAGENT NOT AVAILABLE (*)    All other components within normal limits  GLUCOSE, CAPILLARY - Abnormal; Notable for the following components:   Glucose-Capillary 38 (*)    All other components within normal limits  GLUCOSE, CAPILLARY  ETHANOL  GLUCOSE, CAPILLARY  CBG MONITORING, ED   ____________________________________________  EKG  None - EKG not ordered by ED physician ____________________________________________  RADIOLOGY   ED MD interpretation:  Patient declined MRI  Official radiology report(s): No results found.  ____________________________________________   PROCEDURES  Critical Care performed: No   Procedure(s) performed:   Procedures   ____________________________________________   INITIAL IMPRESSION / ASSESSMENT AND PLAN / ED COURSE  As part of my medical decision making, I reviewed the following data within the [facility] History obtained from family, Nursing notes reviewed and incorporated, Labs reviewed , Old chart reviewed and Notes from prior ED visits    Differential diagnosis includes, but is not limited to, fatigue/exhaustion, electrolyte abnormality, CVA, intracranial hemorrhage, medication side effect or intentional versus unintentional overdose, less likely thyroid dysfunction.  The patient is describing acute onset of fatigue and lightheadedness but she specifically denies vertiginous feelings of room spinning which she reported the last time she was seen with similar symptoms, approximately 4 months ago, 1 month after her initial diagnosis of  CVA.  I reviewed the medical record extensively and saw that she was thought to have had multiple emboli  To her frontal lobe causing some memory loss and executive function limitation.  She has maintained treatment on warfarin and aspirin.  She has no signs or symptoms of infectious process at this time although she has not yet provided a urinalysis.  Vital signs are all stable and within normal limits.  Basic metabolic panel, CBC, and finger stick blood glucose were all within normal limits as well.  For the sake of being complete, I have added on an ethanol level and a urine drug screen but I have a very low suspicion that either 1 of these will be positive.  I reviewed the medication list and I agree that there does not seem to be any obvious culprit for sedation; she verify that she does not take Phenergan even though it is in her medication list.  I had a long conversation with her and her sister.  I explained that I have no way to make sure she is not having new strokes without obtaining an MRI and she very adamantly does not want another MRI today because she said it is too loud and it takes too long.  I reviewed the last ED visit with somewhat similar symptoms, although at that time she was reporting vertigo and nausea/vomiting, and she felt better after some IV fluids and had a otherwise negative work-up.  She would like to try that today with fluids both by IV and by mouth and reassessment.  I again explained that without an MRI, I will be unable to tell her definitively that she is not having a stroke, but in general she appears neurologically intact she is describing a sense of lightheadedness and fatigue rather than vertigo.  She understands. Clinical Course as of Mar 31 1856  Sun Mar 31, 2018  1807 Patient still awake and oriented.  Giving OJ and [place] sandwich.  Glucose-Capillary(!!): 38 [CF]  [id number] Patient feels much better after the 1 L of IV fluids and is ready to go home.  I again  reiterated about the MRI and she does not want to do it but I think it is appropriate.  She is looking much better and has no more lightheadedness or dizziness.  I  gave my usual and customary return precautions.   [CF]    Clinical Course User Index [CF] Hinda Kehr, MD    ____________________________________________  FINAL CLINICAL IMPRESSION(S) / ED DIAGNOSES  Final diagnoses:  Lightheadedness     MEDICATIONS GIVEN DURING THIS VISIT:  Medications  sodium chloride 0.9 % bolus 1,000 mL (1,000 mLs Intravenous New Bag/Given 03/31/18 1622)     ED Discharge Orders    None       Note:  This document was prepared using Dragon voice recognition software and may include unintentional dictation errors.    Hinda Kehr, MD 03/31/18 [phone]

## 2018-03-31 NOTE — Progress Notes (Signed)
   03/31/18 1640  Clinical Encounter Type  Visited With Family;Patient  Visit Type Initial  Referral From Family  Consult/Referral To Chaplain   While on unit, patient's sister requested warm blankets for patient.  Chaplain retrieved blankets; patient asked about possibility of changing room temperature, chaplain passed along request to unit staff.

## 2018-04-11 ENCOUNTER — Ambulatory Visit
Payer: Medicare Other | Attending: Student in an Organized Health Care Education/Training Program | Admitting: Student in an Organized Health Care Education/Training Program

## 2018-04-11 ENCOUNTER — Encounter: Payer: Self-pay | Admitting: Student in an Organized Health Care Education/Training Program

## 2018-04-11 ENCOUNTER — Other Ambulatory Visit: Payer: Self-pay

## 2018-04-11 VITALS — BP 143/68 | HR 78 | Temp 98.0°F | Resp 16 | Ht 62.5 in | Wt 108.0 lb

## 2018-04-11 DIAGNOSIS — Z885 Allergy status to narcotic agent status: Secondary | ICD-10-CM | POA: Diagnosis not present

## 2018-04-11 DIAGNOSIS — I10 Essential (primary) hypertension: Secondary | ICD-10-CM | POA: Insufficient documentation

## 2018-04-11 DIAGNOSIS — Z7982 Long term (current) use of aspirin: Secondary | ICD-10-CM | POA: Insufficient documentation

## 2018-04-11 DIAGNOSIS — E1042 Type 1 diabetes mellitus with diabetic polyneuropathy: Secondary | ICD-10-CM | POA: Insufficient documentation

## 2018-04-11 DIAGNOSIS — R200 Anesthesia of skin: Secondary | ICD-10-CM

## 2018-04-11 DIAGNOSIS — F329 Major depressive disorder, single episode, unspecified: Secondary | ICD-10-CM | POA: Diagnosis not present

## 2018-04-11 DIAGNOSIS — E785 Hyperlipidemia, unspecified: Secondary | ICD-10-CM | POA: Insufficient documentation

## 2018-04-11 DIAGNOSIS — Z7901 Long term (current) use of anticoagulants: Secondary | ICD-10-CM | POA: Diagnosis not present

## 2018-04-11 DIAGNOSIS — Z8673 Personal history of transient ischemic attack (TIA), and cerebral infarction without residual deficits: Secondary | ICD-10-CM | POA: Diagnosis not present

## 2018-04-11 DIAGNOSIS — E876 Hypokalemia: Secondary | ICD-10-CM | POA: Diagnosis not present

## 2018-04-11 DIAGNOSIS — Z9641 Presence of insulin pump (external) (internal): Secondary | ICD-10-CM | POA: Diagnosis not present

## 2018-04-11 DIAGNOSIS — Z794 Long term (current) use of insulin: Secondary | ICD-10-CM | POA: Insufficient documentation

## 2018-04-11 DIAGNOSIS — E108 Type 1 diabetes mellitus with unspecified complications: Secondary | ICD-10-CM | POA: Diagnosis not present

## 2018-04-11 DIAGNOSIS — R202 Paresthesia of skin: Secondary | ICD-10-CM

## 2018-04-11 DIAGNOSIS — Z79899 Other long term (current) drug therapy: Secondary | ICD-10-CM | POA: Insufficient documentation

## 2018-04-11 DIAGNOSIS — E871 Hypo-osmolality and hyponatremia: Secondary | ICD-10-CM | POA: Diagnosis not present

## 2018-04-11 DIAGNOSIS — G894 Chronic pain syndrome: Secondary | ICD-10-CM | POA: Diagnosis not present

## 2018-04-11 DIAGNOSIS — Z5181 Encounter for therapeutic drug level monitoring: Secondary | ICD-10-CM | POA: Diagnosis present

## 2018-04-11 MED ORDER — GABAPENTIN 300 MG PO CAPS: ORAL_CAPSULE | ORAL | 5 refills | Status: DC

## 2018-04-11 NOTE — Progress Notes (Signed)
Patient's Name: Erin Daniels  MRN: [id number]  Referring Provider: Juluis Pitch, MD  DOB: 1945/08/23  PCP: Juluis Pitch, MD  DOS: 04/11/2018  Note by: Gillis Santa, MD  Service setting: Ambulatory outpatient  Specialty: Interventional Pain Management  Location: [facility] (AMB) Pain Management Facility    Patient type: Established   Primary Reason(s) for Visit: Encounter for prescription drug management. (Level of risk: moderate)  CC: Back Pain; Neck Pain; and Foot Pain (bilaterally)  HPI  Erin Daniels is a 72 y.o. year old, female patient, who comes today for a medication management evaluation. She has Right radial fracture; Type 1 diabetes mellitus with complication ([place]); Hyponatremia; Essential hypertension; Hyperlipidemia; Depression; Hypoglycemia; Hypokalemia; Hypothermia; Lactic acid acidosis; Sepsis ([place]); Confusion; DKA (diabetic ketoacidoses) ([place]); Diabetic polyneuropathy associated with type 1 diabetes mellitus ([place]); Numbness and tingling of both legs; and Chronic pain syndrome on their problem list. Her primarily concern today is the Back Pain; Neck Pain; and Foot Pain (bilaterally)  Pain Assessment: Location: Mid, Lower, Upper Back Radiating: into neck Onset: More than a month ago Duration: Chronic pain Quality: Aching Severity: 3 /10 (subjective, self-reported pain score)  Note: Reported level is compatible with observation.                         When using our objective Pain Scale, levels between 6 and 10/10 are said to belong in an emergency room, as it progressively worsens from a 6/10, described as severely limiting, requiring emergency care not usually available at an outpatient pain management facility. At a 6/10 level, communication becomes difficult and requires great effort. Assistance to reach the emergency department may be required. Facial flushing and profuse sweating along with potentially dangerous increases in heart rate and blood pressure will be  evident. Effect on ADL: difficulty with activities Timing: Constant Modifying factors: nothing is helping back pain; medications helping neuropathy in feet  BP: (!) 143/68  HR: 78  Erin Daniels was last scheduled for an appointment on 03/05/2018 for medication management. During today's appointment we reviewed Erin Daniels's chronic pain status, as well as her outpatient medication regimen.  Patient states that increased dose of gabapentin, 300 mg, 300 mg, 900 mg nightly is helping out with her neuropathic pain.  No side effects.  No falls.  No sedation.  The patient  reports that she does not use drugs. Her body mass index is 19.44 kg/m.  Pharmacologic Plan: No change in therapy, at this time.             Laboratory Chemistry  Inflammation Markers (CRP: Acute Phase) (ESR: Chronic Phase) Lab Results  Component Value Date   LATICACIDVEN 1.2 11/11/2017                         Rheumatology Markers No results found for: RF, ANA, LABURIC, URICUR, LYMEIGGIGMAB, LYMEABIGMQN, HLAB27                      Renal Function Markers Lab Results  Component Value Date   BUN 21 03/31/2018   CREATININE 0.54 03/31/2018   GFRAA >60 03/31/2018   GFRNONAA >60 03/31/2018                             Hepatic Function Markers Lab Results  Component Value Date   AST 26 11/11/2017   ALT 19 11/11/2017   ALBUMIN 4.2  11/11/2017   ALKPHOS 72 11/11/2017   LIPASE 15 11/26/2016                        Electrolytes Lab Results  Component Value Date   NA 141 03/31/2018   K 3.7 03/31/2018   CL 104 03/31/2018   CALCIUM 9.4 03/31/2018                        Neuropathy Markers Lab Results  Component Value Date   HGBA1C 5.9 (H) 11/12/2017                        Bone Pathology Markers No results found for: VD25OH, [id number], MV7846NG2, XB2841LK4, 25OHVITD1, 25OHVITD2, 25OHVITD3, TESTOFREE, TESTOSTERONE                       Coagulation Parameters Lab Results  Component Value Date   INR 3.84  11/13/2017   LABPROT 37.5 (H) 11/13/2017   APTT 29 11/26/2016   PLT 216 03/31/2018                        Cardiovascular Markers Lab Results  Component Value Date   BNP 23.4 11/26/2016   CKTOTAL 100 11/26/2016   TROPONINI <0.03 12/04/2017   HGB 12.1 03/31/2018   HCT 35.1 03/31/2018                         CA Markers No results found for: CEA, CA125, LABCA2                      Note: Lab results reviewed.  Recent Diagnostic Imaging Results  MR 3D Recon At Scanner CLINICAL DATA:  Biliary ductal dilatation of uncertain etiology on recent CT.  EXAM: MRI ABDOMEN WITHOUT AND WITH CONTRAST (INCLUDING MRCP)  TECHNIQUE: Multiplanar multisequence MR imaging of the abdomen was performed both before and after the administration of intravenous contrast. Heavily T2-weighted images of the biliary and pancreatic ducts were obtained, and three-dimensional MRCP images were rendered by post processing.  CONTRAST:  43m MULTIHANCE GADOBENATE DIMEGLUMINE 529 MG/ML IV SOLN  COMPARISON:  CT on 11/13/2017  FINDINGS: Lower chest: No acute findings.  Hepatobiliary: No hepatic masses identified. Tiny cyst seen in anterior left hepatic lobe. Gallbladder is unremarkable in appearance. Mild biliary ductal dilatation is seen with common bile duct measuring 10 mm. No evidence of choledocholithiasis, biliary stricture, or mass.  Pancreas: No mass or inflammatory changes. No evidence of pancreatic ductal dilatation or pancreas divisum.  Spleen:  Within normal limits in size and appearance.  Adrenals/Urinary Tract: No masses identified. No evidence of hydronephrosis.  Stomach/Bowel: Visualized portion unremarkable. Large colonic stool burden noted.  Vascular/Lymphatic: No pathologically enlarged lymph nodes identified. No abdominal aortic aneurysm.  Other:  None.  Musculoskeletal:  No suspicious bone lesions identified.  IMPRESSION: Mild biliary ductal dilatation. No obstructing  etiology or other acute findings identified.  Electronically Signed   By: JEarle GellM.D.   On: 12/28/2017 08:43 MR ABDOMEN MRCP W WO CONTAST CLINICAL DATA:  Biliary ductal dilatation of uncertain etiology on recent CT.  EXAM: MRI ABDOMEN WITHOUT AND WITH CONTRAST (INCLUDING MRCP)  TECHNIQUE: Multiplanar multisequence MR imaging of the abdomen was performed both before and after the administration of intravenous contrast. Heavily T2-weighted images of the biliary and pancreatic ducts were obtained, and  three-dimensional MRCP images were rendered by post processing.  CONTRAST:  2m MULTIHANCE GADOBENATE DIMEGLUMINE 529 MG/ML IV SOLN  COMPARISON:  CT on 11/13/2017  FINDINGS: Lower chest: No acute findings.  Hepatobiliary: No hepatic masses identified. Tiny cyst seen in anterior left hepatic lobe. Gallbladder is unremarkable in appearance. Mild biliary ductal dilatation is seen with common bile duct measuring 10 mm. No evidence of choledocholithiasis, biliary stricture, or mass.  Pancreas: No mass or inflammatory changes. No evidence of pancreatic ductal dilatation or pancreas divisum.  Spleen:  Within normal limits in size and appearance.  Adrenals/Urinary Tract: No masses identified. No evidence of hydronephrosis.  Stomach/Bowel: Visualized portion unremarkable. Large colonic stool burden noted.  Vascular/Lymphatic: No pathologically enlarged lymph nodes identified. No abdominal aortic aneurysm.  Other:  None.  Musculoskeletal:  No suspicious bone lesions identified.  IMPRESSION: Mild biliary ductal dilatation. No obstructing etiology or other acute findings identified.  Electronically Signed   By: JEarle GellM.D.   On: 12/28/2017 08:43  Complexity Note: Imaging results reviewed. Results shared with Ms. MCiani using Layman's terms.                         Meds   Current Outpatient Medications:  .  aspirin 325 MG EC tablet, Take 325 mg by mouth  daily with breakfast., Disp: , Rfl:  .  atorvastatin (LIPITOR) 40 MG tablet, Take 1 tablet (40 mg total) by mouth daily at 6 PM., Disp: 30 tablet, Rfl: 1 .  butalbital-acetaminophen-caffeine (FIORICET, ESGIC) 50-325-40 MG tablet, Take 0.5-1 tablets by mouth 2 (two) times daily as needed for headache., Disp: 60 tablet, Rfl: 5 .  Calcium Carb-Cholecalciferol (CALCIUM 600 + D PO), Take 1 tablet by mouth every evening., Disp: , Rfl:  .  carboxymethylcellulose (REFRESH PLUS) 0.5 % SOLN, 1 drop 3 (three) times daily as needed., Disp: , Rfl:  .  Cholecalciferol (VITAMIN D3) 5000 units CAPS, Take by mouth., Disp: , Rfl:  .  gabapentin (NEURONTIN) 300 MG capsule, 1 in the am, 1 at lunch and 3 at bedtime, Disp: 150 capsule, Rfl: 5 .  glucose 4 GM chewable tablet, Chew 1 tablet by mouth as needed for low blood sugar., Disp: , Rfl:  .  Insulin Human (INSULIN PUMP) SOLN, Inject 0.4-4 each into the skin continuous. Uses Novolog. ( pt states she takes 40 units cont through out day), Disp: , Rfl:  .  IRON, FERROUS GLUCONATE, PO, Take 1 tablet by mouth every evening., Disp: , Rfl:  .  losartan (COZAAR) 50 MG tablet, Take 50 mg by mouth daily., Disp: , Rfl:  .  Multiple Vitamin (MULTIVITAMIN WITH MINERALS) TABS tablet, Take 1 tablet by mouth every evening., Disp: , Rfl:  .  Polyethyl Glycol-Propyl Glycol (SYSTANE) 0.4-0.3 % SOLN, Apply 1 drop to eye as needed (dry eyes)., Disp: , Rfl:  .  polyethylene glycol (MIRALAX / GLYCOLAX) packet, Take 17 g by mouth every other day., Disp: , Rfl:  .  promethazine (PHENERGAN) 25 MG suppository, Place 25 mg rectally every 6 (six) hours as needed for nausea or vomiting., Disp: , Rfl:  .  venlafaxine (EFFEXOR) 75 MG tablet, Take 75 mg by mouth 2 (two) times daily., Disp: , Rfl:  .  vitamin C (ASCORBIC ACID) 500 MG tablet, Take 500 mg by mouth 2 (two) times daily., Disp: , Rfl:  .  warfarin (COUMADIN) 5 MG tablet, Take 7.5 mg by mouth daily at 6 PM. , Disp: , Rfl:  .  amitriptyline  (ELAVIL) 25 MG tablet, Take 25 mg by mouth at bedtime., Disp: , Rfl:  .  meclizine (ANTIVERT) 12.5 MG tablet, Take 1 tablet (12.5 mg total) by mouth 3 (three) times daily as needed for dizziness. (Patient not taking: Reported on 03/31/2018), Disp: 12 tablet, Rfl: 0  ROS  Constitutional: Denies any fever or chills Gastrointestinal: No reported hemesis, hematochezia, vomiting, or acute GI distress Musculoskeletal: Denies any acute onset joint swelling, redness, loss of ROM, or weakness Neurological: No reported episodes of acute onset apraxia, aphasia, dysarthria, agnosia, amnesia, paralysis, loss of coordination, or loss of consciousness  Allergies  Ms. Goulart is allergic to codeine; meperidine; metoclopramide hcl; oxycodone; stadol  [butorphanol]; tussionex pennkinetic er [hydrocod polst-cpm polst er]; and carbocaine  [mepivacaine hcl].  PFSH  Drug: Ms. Talwar  reports that she does not use drugs. Alcohol:  reports that she does not drink alcohol. Tobacco:  reports that she has never smoked. She has never used smokeless tobacco. Medical:  has a past medical history of At high risk for falls, Basal cell carcinoma, Complication of anesthesia, Diabetes mellitus without complication ([place]), Diabetic neuropathy ([place]), Diabetic retinopathy ([place]), Dizziness, Gastroparesis, Herniated disc, cervical, Hypercholesteremia, Hypertension, Hypotensive episode, Hypothyroidism, Insulin pump in place, Myofascial muscle pain, Numbness of arm, Osteoporosis, Poor vision, Seizures ([place]), Slurred speech, Stroke (cerebrum) ([place]), Stroke ([place]), Vertigo, and Wears glasses. Surgical: Ms. Mincy  has a past surgical history that includes Tonsillectomy; Hemorroidectomy; Hernia repair; Joint replacement; Appendectomy; Tubal ligation; Eye surgery; Trigger finger release; Bilateral carpal tunnel release; Synovectomy; Colonoscopy; Shoulder arthroscopy (Left); Shoulder hemi-arthroplasty (Left); Open reduction internal  fixation (orif) distal radial fracture (Right, 05/29/2015); and Augmentation mammaplasty (Bilateral, 2011). Family: family history is not on file.  Constitutional Exam  General appearance: Well nourished, well developed, and well hydrated. In no apparent acute distress Vitals:   04/11/18 1340  BP: (!) 143/68  Pulse: 78  Resp: 16  Temp: 98 F (36.7 C)  TempSrc: Oral  SpO2: 98%  Weight: 108 lb (49 kg)  Height: 5' 2.5" (1.588 m)   BMI Assessment: Estimated body mass index is 19.44 kg/m as calculated from the following:   Height as of this encounter: 5' 2.5" (1.588 m).   Weight as of this encounter: 108 lb (49 kg).  BMI interpretation table: BMI level Category Range association with higher incidence of chronic pain  <18 kg/m2 Underweight   18.5-24.9 kg/m2 Ideal body weight   25-29.9 kg/m2 Overweight Increased incidence by 20%  30-34.9 kg/m2 Obese (Class I) Increased incidence by 68%  35-39.9 kg/m2 Severe obesity (Class II) Increased incidence by 136%  >40 kg/m2 Extreme obesity (Class III) Increased incidence by 254%   Patient's current BMI Ideal Body weight  Body mass index is 19.44 kg/m. Ideal body weight: 51.3 kg (112 lb 15.8 oz)   BMI Readings from Last 4 Encounters:  04/11/18 19.44 kg/m  03/31/18 20.41 kg/m  03/05/18 20.41 kg/m  12/10/17 19.89 kg/m   Wt Readings from Last 4 Encounters:  04/11/18 108 lb (49 kg)  03/31/18 108 lb 0.4 oz (49 kg)  03/05/18 108 lb (49 kg)  12/10/17 107 lb (48.5 kg)  Psych/Mental status: Alert, oriented x 3 (person, place, & time)       Eyes: PERLA Respiratory: No evidence of acute respiratory distress  Cervical Spine Area Exam  Skin & Axial Inspection: No masses, redness, edema, swelling, or associated skin lesions Alignment: Symmetrical Functional ROM: Unrestricted ROM      Stability: No instability detected Muscle Tone/Strength:  Functionally intact. No obvious neuro-muscular anomalies detected. Sensory (Neurological):  Unimpaired Palpation: No palpable anomalies              Upper Extremity (UE) Exam    Side: Right upper extremity  Side: Left upper extremity  Skin & Extremity Inspection: Skin color, temperature, and hair growth are WNL. No peripheral edema or cyanosis. No masses, redness, swelling, asymmetry, or associated skin lesions. No contractures.  Skin & Extremity Inspection: Skin color, temperature, and hair growth are WNL. No peripheral edema or cyanosis. No masses, redness, swelling, asymmetry, or associated skin lesions. No contractures.  Functional ROM: Unrestricted ROM          Functional ROM: Unrestricted ROM          Muscle Tone/Strength: Functionally intact. No obvious neuro-muscular anomalies detected.  Muscle Tone/Strength: Functionally intact. No obvious neuro-muscular anomalies detected.  Sensory (Neurological): Unimpaired          Sensory (Neurological): Unimpaired          Palpation: No palpable anomalies              Palpation: No palpable anomalies              Provocative Test(s):  Phalen's test: deferred Tinel's test: deferred Apley's scratch test (touch opposite shoulder):  Action 1 (Across chest): deferred Action 2 (Overhead): deferred Action 3 (LB reach): deferred   Provocative Test(s):  Phalen's test: deferred Tinel's test: deferred Apley's scratch test (touch opposite shoulder):  Action 1 (Across chest): deferred Action 2 (Overhead): deferred Action 3 (LB reach): deferred    Thoracic Spine Area Exam  Skin & Axial Inspection: No masses, redness, or swelling Alignment: Symmetrical Functional ROM: Unrestricted ROM Stability: No instability detected Muscle Tone/Strength: Functionally intact. No obvious neuro-muscular anomalies detected. Sensory (Neurological): Unimpaired Muscle strength & Tone: No palpable anomalies  Lumbar Spine Area Exam  Skin & Axial Inspection: No masses, redness, or swelling Alignment: Symmetrical Functional ROM: Unrestricted ROM        Stability: No instability detected Muscle Tone/Strength: Functionally intact. No obvious neuro-muscular anomalies detected. Sensory (Neurological): Unimpaired Palpation: No palpable anomalies       Provocative Tests: Hyperextension/rotation test: deferred today       Lumbar quadrant test (Kemp's test): deferred today       Lateral bending test: deferred today       Patrick's Maneuver: deferred today                   FABER test: deferred today                   S-I anterior distraction/compression test: deferred today         S-I lateral compression test: deferred today         S-I Thigh-thrust test: deferred today         S-I Gaenslen's test: deferred today          Gait & Posture Assessment  Ambulation: Unassisted Gait: Relatively normal for age and body habitus Posture: WNL    Lower Extremity Exam    Side: Right lower extremity  Side: Left lower extremity  Stability: No instability observed          Stability: No instability observed          Skin & Extremity Inspection: Skin color, temperature, and hair growth are WNL. No peripheral edema or cyanosis. No masses, redness, swelling, asymmetry, or associated skin lesions. No contractures.  Skin & Extremity Inspection: Skin  color, temperature, and hair growth are WNL. No peripheral edema or cyanosis. No masses, redness, swelling, asymmetry, or associated skin lesions. No contractures.  Functional ROM: Unrestricted ROM                  Functional ROM: Unrestricted ROM                  Muscle Tone/Strength: Functionally intact. No obvious neuro-muscular anomalies detected.  Muscle Tone/Strength: Functionally intact. No obvious neuro-muscular anomalies detected.  Sensory (Neurological): Paresthesia (Tingling sensation)  Sensory (Neurological): Paresthesia (Tingling sensation)  Palpation: No palpable anomalies  Palpation: No palpable anomalies     Assessment  Primary Diagnosis & Pertinent Problem List: The primary  encounter diagnosis was Diabetic polyneuropathy associated with type 1 diabetes mellitus ([place]). Diagnoses of Type 1 diabetes mellitus with complication ([facility]), Numbness and tingling of both legs, and Chronic pain syndrome were also pertinent to this visit.  Status Diagnosis  Controlled Controlled Controlled 1. Diabetic polyneuropathy associated with type 1 diabetes mellitus ([place])   2. Type 1 diabetes mellitus with complication ([facility])   3. Numbness and tingling of both legs   4. Chronic pain syndrome      General Recommendations: The pain condition that the patient suffers from is best treated with a multidisciplinary approach that involves an increase in physical activity to prevent de-conditioning and worsening of the pain cycle, as well as psychological counseling (formal and/or informal) to address the co-morbid psychological affects of pain. Treatment will often involve judicious use of pain medications and interventional procedures to decrease the pain, allowing the patient to participate in the physical activity that will ultimately produce long-lasting pain reductions. The goal of the multidisciplinary approach is to return the patient to a higher level of overall function and to restore their ability to perform activities of daily living.  72 year old female with a history of prior CVA who presents with lower extremity paresthesias most pronounced in her ankles and feet secondary to diabetic polyneuropathy from type 1 diabetes. Patient has been dealing with neuropathy for greater than 20 years. She has an insulin pump in place.   Patient states that increased dose of gabapentin, 300 mg, 300 mg, 900 mg nightly is helping out with her neuropathic pain.  No side effects.  No falls.  No sedation.  Refill as below of Gabapentin. PCP can take over this.  Plan of Care  Pharmacotherapy (Medications Ordered): Meds ordered this encounter  Medications  . gabapentin (NEURONTIN) 300 MG capsule     Sig: 1 in the am, 1 at lunch and 3 at bedtime    Dispense:  150 capsule    Refill:  5    Provider-requested follow-up: Return in about 6 months (around 10/12/2018) for Medication Management.  Future Appointments  Date Time Provider [place]  10/10/2018  1:30 PM Gillis Santa, MD [facility] None    Primary Care Physician: Juluis Pitch, MD Location: [facility] Outpatient Pain Management Facility Note by: Gillis Santa, M.D Date: 04/11/2018; Time: 2:40 PM  Patient Instructions  Gabapentin has been escribed to your pharmacy.

## 2018-04-11 NOTE — Progress Notes (Signed)
Safety precautions to be maintained throughout the outpatient stay will include: orient to surroundings, keep bed in low position, maintain call bell within reach at all times, provide assistance with transfer out of bed and ambulation.  

## 2018-04-11 NOTE — Patient Instructions (Signed)
Gabapentin has been escribed to your pharmacy. 

## 2018-05-28 ENCOUNTER — Encounter: Payer: Self-pay | Admitting: Emergency Medicine

## 2018-05-28 ENCOUNTER — Emergency Department: Payer: Medicare Other

## 2018-05-28 ENCOUNTER — Inpatient Hospital Stay
Admission: EM | Admit: 2018-05-28 | Discharge: 2018-05-29 | DRG: 125 | Disposition: A | Discharge status: Home / Self Care | Payer: Medicare Other | Attending: Internal Medicine | Admitting: Internal Medicine

## 2018-05-28 ENCOUNTER — Other Ambulatory Visit: Payer: Self-pay

## 2018-05-28 DIAGNOSIS — Z85828 Personal history of other malignant neoplasm of skin: Secondary | ICD-10-CM

## 2018-05-28 DIAGNOSIS — Z7982 Long term (current) use of aspirin: Secondary | ICD-10-CM

## 2018-05-28 DIAGNOSIS — M810 Age-related osteoporosis without current pathological fracture: Secondary | ICD-10-CM | POA: Diagnosis present

## 2018-05-28 DIAGNOSIS — Z9181 History of falling: Secondary | ICD-10-CM

## 2018-05-28 DIAGNOSIS — Z884 Allergy status to anesthetic agent status: Secondary | ICD-10-CM

## 2018-05-28 DIAGNOSIS — E1040 Type 1 diabetes mellitus with diabetic neuropathy, unspecified: Secondary | ICD-10-CM | POA: Diagnosis present

## 2018-05-28 DIAGNOSIS — Z885 Allergy status to narcotic agent status: Secondary | ICD-10-CM | POA: Diagnosis not present

## 2018-05-28 DIAGNOSIS — Z79899 Other long term (current) drug therapy: Secondary | ICD-10-CM | POA: Diagnosis not present

## 2018-05-28 DIAGNOSIS — H5340 Unspecified visual field defects: Secondary | ICD-10-CM

## 2018-05-28 DIAGNOSIS — Z888 Allergy status to other drugs, medicaments and biological substances status: Secondary | ICD-10-CM

## 2018-05-28 DIAGNOSIS — R29701 NIHSS score 1: Secondary | ICD-10-CM | POA: Diagnosis not present

## 2018-05-28 DIAGNOSIS — I6930 Unspecified sequelae of cerebral infarction: Secondary | ICD-10-CM | POA: Diagnosis present

## 2018-05-28 DIAGNOSIS — Z8673 Personal history of transient ischemic attack (TIA), and cerebral infarction without residual deficits: Secondary | ICD-10-CM | POA: Diagnosis not present

## 2018-05-28 DIAGNOSIS — E785 Hyperlipidemia, unspecified: Secondary | ICD-10-CM | POA: Diagnosis present

## 2018-05-28 DIAGNOSIS — I10 Essential (primary) hypertension: Secondary | ICD-10-CM | POA: Diagnosis present

## 2018-05-28 DIAGNOSIS — E10649 Type 1 diabetes mellitus with hypoglycemia without coma: Secondary | ICD-10-CM | POA: Diagnosis present

## 2018-05-28 DIAGNOSIS — H5461 Unqualified visual loss, right eye, normal vision left eye: Secondary | ICD-10-CM | POA: Diagnosis not present

## 2018-05-28 DIAGNOSIS — Z96612 Presence of left artificial shoulder joint: Secondary | ICD-10-CM | POA: Diagnosis not present

## 2018-05-28 DIAGNOSIS — Z7901 Long term (current) use of anticoagulants: Secondary | ICD-10-CM

## 2018-05-28 DIAGNOSIS — E162 Hypoglycemia, unspecified: Secondary | ICD-10-CM

## 2018-05-28 DIAGNOSIS — E10319 Type 1 diabetes mellitus with unspecified diabetic retinopathy without macular edema: Secondary | ICD-10-CM | POA: Diagnosis not present

## 2018-05-28 DIAGNOSIS — E039 Hypothyroidism, unspecified: Secondary | ICD-10-CM | POA: Diagnosis present

## 2018-05-28 LAB — DIFFERENTIAL
Abs Immature Granulocytes: 0.01 10*3/uL (ref 0.00–0.07)
Basophils Absolute: 0 10*3/uL (ref 0.0–0.1)
Basophils Relative: 1 %
EOS PCT: 5 %
Eosinophils Absolute: 0.3 10*3/uL (ref 0.0–0.5)
IMMATURE GRANULOCYTES: 0 %
LYMPHS ABS: 1.6 10*3/uL (ref 0.7–4.0)
LYMPHS PCT: 27 %
Monocytes Absolute: 0.8 10*3/uL (ref 0.1–1.0)
Monocytes Relative: 13 %
Neutro Abs: 3.3 10*3/uL (ref 1.7–7.7)
Neutrophils Relative %: 54 %

## 2018-05-28 LAB — CBC
HEMATOCRIT: 36.6 % (ref 36.0–46.0)
Hemoglobin: 12.1 g/dL (ref 12.0–15.0)
MCH: 31.6 pg (ref 26.0–34.0)
MCHC: 33.1 g/dL (ref 30.0–36.0)
MCV: 95.6 fL (ref 80.0–100.0)
NRBC: 0 % (ref 0.0–0.2)
Platelets: 233 10*3/uL (ref 150–400)
RBC: 3.83 MIL/uL — ABNORMAL LOW (ref 3.87–5.11)
RDW: 12.2 % (ref 11.5–15.5)
WBC: 6.1 10*3/uL (ref 4.0–10.5)

## 2018-05-28 LAB — COMPREHENSIVE METABOLIC PANEL
ALBUMIN: 4.6 g/dL (ref 3.5–5.0)
ALK PHOS: 65 U/L (ref 38–126)
ALT: 20 U/L (ref 0–44)
ANION GAP: 7 (ref 5–15)
AST: 28 U/L (ref 15–41)
BILIRUBIN TOTAL: 0.4 mg/dL (ref 0.3–1.2)
BUN: 24 mg/dL — AB (ref 8–23)
CALCIUM: 10.1 mg/dL (ref 8.9–10.3)
CO2: 32 mmol/L (ref 22–32)
Chloride: 103 mmol/L (ref 98–111)
Creatinine, Ser: 0.57 mg/dL (ref 0.44–1.00)
GFR calc Af Amer: >60 mL/min (ref >60)
GFR calc non Af Amer: >60 mL/min (ref >60)
GLUCOSE: 63 mg/dL — AB (ref 70–99)
Potassium: 4.5 mmol/L (ref 3.5–5.1)
Sodium: 142 mmol/L (ref 135–145)
TOTAL PROTEIN: 7 g/dL (ref 6.5–8.1)

## 2018-05-28 LAB — PROTIME-INR
INR: 1.58
PROTHROMBIN TIME: 18.7 s — AB (ref 11.4–15.2)

## 2018-05-28 LAB — APTT: aPTT: 37 seconds — ABNORMAL HIGH (ref 24–36)

## 2018-05-28 LAB — GLUCOSE, CAPILLARY
GLUCOSE-CAPILLARY: 55 mg/dL — AB (ref 70–99)
Glucose-Capillary: 44 mg/dL — CL (ref 70–99)
Glucose-Capillary: 102 mg/dL — ABNORMAL HIGH (ref 70–99)

## 2018-05-28 LAB — TROPONIN I: Troponin I: <0.03 ng/mL (ref <0.03)

## 2018-05-28 MED ORDER — ASPIRIN EC 325 MG PO TBEC
325.0000 mg | DELAYED_RELEASE_TABLET | Freq: Every day | ORAL | Status: DC
  Administered 2018-05-29: 09:00:00 325 mg via ORAL
  Filled 2018-05-28: qty 1

## 2018-05-28 MED ORDER — POLYVINYL ALCOHOL 1.4 % OP SOLN
1.0000 [drp] | Freq: Three times a day (TID) | OPHTHALMIC | Status: DC | PRN
  Filled 2018-05-28: qty 15

## 2018-05-28 MED ORDER — IOHEXOL 350 MG/ML SOLN
75.0000 mL | Freq: Once | INTRAVENOUS | Status: AC | PRN
  Administered 2018-05-28: 75 mL via INTRAVENOUS

## 2018-05-28 MED ORDER — WARFARIN - PHARMACIST DOSING INPATIENT: Freq: Every day | Status: DC

## 2018-05-28 MED ORDER — ATORVASTATIN CALCIUM 20 MG PO TABS
40.0000 mg | ORAL_TABLET | Freq: Every evening | ORAL | Status: DC
  Administered 2018-05-28: 23:00:00 40 mg via ORAL
  Filled 2018-05-28: qty 2

## 2018-05-28 MED ORDER — ADULT MULTIVITAMIN W/MINERALS CH
1.0000 | ORAL_TABLET | Freq: Every evening | ORAL | Status: DC
  Administered 2018-05-28: 1 via ORAL
  Filled 2018-05-28: qty 1

## 2018-05-28 MED ORDER — BUTALBITAL-APAP-CAFFEINE 50-325-40 MG PO TABS
0.5000 | ORAL_TABLET | Freq: Two times a day (BID) | ORAL | Status: DC | PRN
  Administered 2018-05-29: 09:00:00 1 via ORAL
  Filled 2018-05-28 (×2): qty 1

## 2018-05-28 MED ORDER — LORATADINE 10 MG PO TABS
10.0000 mg | ORAL_TABLET | Freq: Every day | ORAL | Status: DC
  Administered 2018-05-29: 09:00:00 10 mg via ORAL
  Filled 2018-05-28: qty 1

## 2018-05-28 MED ORDER — WARFARIN SODIUM 10 MG PO TABS
10.0000 mg | ORAL_TABLET | Freq: Once | ORAL | Status: AC
  Administered 2018-05-28: 23:00:00 10 mg via ORAL
  Filled 2018-05-28: qty 1

## 2018-05-28 MED ORDER — INSULIN ASPART 100 UNIT/ML ~~LOC~~ SOLN: 0.0000 [IU] | Freq: Three times a day (TID) | SUBCUTANEOUS | Status: DC

## 2018-05-28 MED ORDER — FERROUS SULFATE 325 (65 FE) MG PO TABS
325.0000 mg | ORAL_TABLET | Freq: Every evening | ORAL | Status: DC
  Administered 2018-05-28: 23:00:00 325 mg via ORAL
  Filled 2018-05-28: qty 1

## 2018-05-28 MED ORDER — VENLAFAXINE HCL 37.5 MG PO TABS
75.0000 mg | ORAL_TABLET | Freq: Two times a day (BID) | ORAL | Status: DC
  Administered 2018-05-28 – 2018-05-29 (×2): 75 mg via ORAL
  Filled 2018-05-28 (×4): qty 2

## 2018-05-28 MED ORDER — STROKE: EARLY STAGES OF RECOVERY BOOK
Freq: Once | Status: AC
  Administered 2018-05-28: 23:00:00

## 2018-05-28 MED ORDER — GABAPENTIN 300 MG PO CAPS
300.0000 mg | ORAL_CAPSULE | Freq: Two times a day (BID) | ORAL | Status: DC
  Administered 2018-05-29 (×2): 300 mg via ORAL
  Filled 2018-05-28 (×2): qty 1

## 2018-05-28 MED ORDER — WARFARIN SODIUM 7.5 MG PO TABS: 7.5000 mg | ORAL_TABLET | Freq: Every evening | ORAL | Status: DC

## 2018-05-28 MED ORDER — POLYETHYL GLYCOL-PROPYL GLYCOL 0.4-0.3 % OP SOLN: 1.0000 [drp] | OPHTHALMIC | Status: DC | PRN

## 2018-05-28 MED ORDER — VITAMIN D3 25 MCG (1000 UNIT) PO TABS
5000.0000 [IU] | ORAL_TABLET | Freq: Every day | ORAL | Status: DC
  Administered 2018-05-29: 5000 [IU] via ORAL
  Filled 2018-05-28 (×2): qty 5

## 2018-05-28 MED ORDER — INSULIN PUMP
1.0000 | SUBCUTANEOUS | Status: DC
  Filled 2018-05-28: qty 1

## 2018-05-28 MED ORDER — GABAPENTIN 300 MG PO CAPS
900.0000 mg | ORAL_CAPSULE | Freq: Every day | ORAL | Status: DC
  Administered 2018-05-28: 23:00:00 900 mg via ORAL
  Filled 2018-05-28: qty 3

## 2018-05-28 MED ORDER — INSULIN ASPART 100 UNIT/ML ~~LOC~~ SOLN: 0.0000 [IU] | Freq: Every day | SUBCUTANEOUS | Status: DC

## 2018-05-28 MED ORDER — DEXTROSE 50 % IV SOLN
INTRAVENOUS | Status: AC
  Administered 2018-05-28: 18:00:00
  Filled 2018-05-28: qty 50

## 2018-05-28 MED ORDER — VITAMIN C 500 MG PO TABS
500.0000 mg | ORAL_TABLET | Freq: Two times a day (BID) | ORAL | Status: DC
  Administered 2018-05-28 – 2018-05-29 (×2): 500 mg via ORAL
  Filled 2018-05-28 (×2): qty 1

## 2018-05-28 MED ORDER — POLYETHYLENE GLYCOL 3350 17 G PO PACK
17.0000 g | PACK | ORAL | Status: DC
  Administered 2018-05-29: 09:00:00 17 g via ORAL
  Filled 2018-05-28: qty 1

## 2018-05-28 MED ORDER — LOSARTAN POTASSIUM 50 MG PO TABS
50.0000 mg | ORAL_TABLET | Freq: Every evening | ORAL | Status: DC
  Administered 2018-05-28: 23:00:00 50 mg via ORAL
  Filled 2018-05-28: qty 1

## 2018-05-28 MED ORDER — CALCIUM CARBONATE-VITAMIN D 500-200 MG-UNIT PO TABS
1.0000 | ORAL_TABLET | Freq: Every evening | ORAL | Status: DC
  Administered 2018-05-28: 23:00:00 1 via ORAL
  Filled 2018-05-28: qty 1

## 2018-05-28 NOTE — Consult Note (Signed)
[facility] for warfarin dosing Indication: h/o CVA  Allergies  Allergen Reactions  . Codeine Nausea Only    Can take Vicodin and Percocet   . Meperidine Nausea Only  . Metoclopramide Hcl Nausea Only  . Oxycodone Nausea Only  . Stadol  [Butorphanol] Nausea Only  . Tussionex Pennkinetic Er [Hydrocod Polst-Cpm Polst Er] Other (See Comments)    Filled patient with sugar   . Carbocaine  [Mepivacaine Hcl] Palpitations    Patient Measurements: Height: 5\' 2"  (157.5 cm) Weight: 101 lb (45.8 kg) IBW/kg (Calculated) : 50.1  Labs: Recent Labs    05/28/18 1731  HGB 12.1  HCT 36.6  PLT 233  APTT 37*  LABPROT 18.7*  INR 1.58  CREATININE 0.57  TROPONINI <0.03    Estimated Creatinine Clearance: 44.6 mL/min (by C-G formula based on SCr of 0.57 mg/dL).  Assessment: 72 y.o. female h/o type I diabetes mellitus on insulin pump, recurrent strokes on Coumadin, hypertension, hypothyroidism, diabetic retinopathy and neuropathy, gastroparesis presents to hospital secondary to visual loss. She confirms she is on 7.5mg  warfarin daily at home with last dose 10/7. She currently has a subtherapeutic INR. She was here in late March of this year but did not receive enough doses to guide therapy  Goal of Therapy:  INR 2-3 Monitor platelets by anticoagulation protocol: Yes   Plan: Since she does not appear to have missed any doses I am going with the assumption that her home dose is inadequate. Additionally, in care everywhere, there are several subtherapeutic INRs.  For tonight 10mg  warfarin. We will follow along and dose according to INR response.   Dallie Piles, PharmD 05/28/2018,7:45 PM

## 2018-05-28 NOTE — ED Provider Notes (Signed)
[facility] Emergency Department Provider Note    First MD Initiated Contact with Patient 05/28/18 1740     (approximate)  I have reviewed the triage vital signs and the nursing notes.   HISTORY  Chief Complaint Loss of Vision    HPI Erin Daniels is a 72 y.o. female presents from [facility] due to confusion as well as visual field cut.  Patient reports waking up early this morning at 3 AM with confusion describing blurry vision.  Husband was at bedside states that she was having trouble forming sentences checked his blood sugar and it was 88.  States that she was feeling unsteady as well as more confused similar to her previous presentation when she was diagnosed with CVA.  They made an urgent appointment with ophthalmology due to her visual changes.  There is no evidence of peripheral cause of vision loss that she was directed to the ER for further evaluation.  Denies any chest pain.  No nausea or vomiting.  No medication changes.  She is on Coumadin but is uncertain as to why other than stating, "I had a stroke,.    Past Medical History:  Diagnosis Date  . At high risk for falls   . Basal cell carcinoma    right upper leg  . Complication of anesthesia    difficult to arouse  . Diabetes mellitus without complication (HCC)    Type I  . Diabetic neuropathy ([place])   . Diabetic retinopathy ([place])   . Dizziness   . Gastroparesis   . Herniated disc, cervical   . Hypercholesteremia   . Hypertension   . Hypotensive episode   . Hypothyroidism   . Insulin pump in place   . Myofascial muscle pain   . Numbness of arm    left  . Osteoporosis   . Poor vision   . Seizures ([place])   . Slurred speech   . 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 Stroke (HCC)    x5  . Vertigo   . Wears glasses    No family history on file. Past Surgical History:  Procedure Laterality Date  . APPENDECTOMY    . AUGMENTATION MAMMAPLASTY Bilateral 2011  . BILATERAL  CARPAL TUNNEL RELEASE    . COLONOSCOPY    . EYE SURGERY    . HEMORROIDECTOMY    . HERNIA REPAIR    . JOINT REPLACEMENT    . OPEN REDUCTION INTERNAL FIXATION (ORIF) DISTAL RADIAL FRACTURE Right 05/29/2015   Procedure: OPEN REDUCTION INTERNAL FIXATION (ORIF) RIGHT DISTAL RADIUS FRACTURE WITH ALLOGRAFT BONE GRAFTING FOR REPAIR AND RECONSTRUCTION;  Surgeon: Roseanne Kaufman, MD;  Location: [place];  Service: Orthopedics;  Laterality: Right;  . SHOULDER ARTHROSCOPY Left   . SHOULDER HEMI-ARTHROPLASTY Left   . SYNOVECTOMY    . TONSILLECTOMY    . TRIGGER FINGER RELEASE    . TUBAL LIGATION     Patient Active Problem List   Diagnosis Date Noted  . Diabetic polyneuropathy associated with type 1 diabetes mellitus ([place]) 12/10/2017  . Numbness and tingling of both legs 12/10/2017  . Chronic pain syndrome 12/10/2017  . DKA (diabetic ketoacidoses) ([place]) 11/12/2017  . Confusion 11/11/2017  . Hypoglycemia 11/26/2016  . Hypokalemia   . Hypothermia   . Lactic acid acidosis   . Sepsis ([place])   . Type 1 diabetes mellitus with complication ([place])   . Hyponatremia   . Essential hypertension   . Hyperlipidemia   .  Depression   . Right radial fracture 05/29/2015      Prior to Admission medications   Medication Sig Start Date End Date Taking? Authorizing Provider  amitriptyline (ELAVIL) 25 MG tablet Take 25 mg by mouth at bedtime.    [provider]  aspirin 325 MG EC tablet Take 325 mg by mouth daily with breakfast.    [provider]  atorvastatin (LIPITOR) 40 MG tablet Take 1 tablet (40 mg total) by mouth daily at 6 PM. 11/13/17   Demetrios Loll, MD  butalbital-acetaminophen-caffeine (FIORICET, ESGIC) [phone] MG tablet Take 0.5-1 tablets by mouth 2 (two) times daily as needed for headache. 02/07/18   Gillis Santa, MD  Calcium Carb-Cholecalciferol (CALCIUM 600 + D PO) Take 1 tablet by mouth every evening.    [provider]  carboxymethylcellulose (REFRESH PLUS) 0.5 % SOLN 1 drop  3 (three) times daily as needed.    [provider]  Cholecalciferol (VITAMIN D3) 5000 units CAPS Take by mouth.    [provider]  gabapentin (NEURONTIN) 300 MG capsule 1 in the am, 1 at lunch and 3 at bedtime 04/11/18   Gillis Santa, MD  glucose 4 GM chewable tablet Chew 1 tablet by mouth as needed for low blood sugar.    [provider]  Insulin Human (INSULIN PUMP) SOLN Inject 0.4-4 each into the skin continuous. Uses Novolog. ( pt states she takes 40 units cont through out day)    [provider]  IRON, FERROUS GLUCONATE, PO Take 1 tablet by mouth every evening.    [provider]  losartan (COZAAR) 50 MG tablet Take 50 mg by mouth daily.    [provider]  meclizine (ANTIVERT) 12.5 MG tablet Take 1 tablet (12.5 mg total) by mouth 3 (three) times daily as needed for dizziness. Patient not taking: Reported on 03/31/2018 12/04/17   Merlyn Lot, MD  Multiple Vitamin (MULTIVITAMIN WITH MINERALS) TABS tablet Take 1 tablet by mouth every evening.    [provider]  Polyethyl Glycol-Propyl Glycol (SYSTANE) 0.4-0.3 % SOLN Apply 1 drop to eye as needed (dry eyes).    [provider]  polyethylene glycol (MIRALAX / GLYCOLAX) packet Take 17 g by mouth every other day.    [provider]  promethazine (PHENERGAN) 25 MG suppository Place 25 mg rectally every 6 (six) hours as needed for nausea or vomiting.    [provider]  venlafaxine (EFFEXOR) 75 MG tablet Take 75 mg by mouth 2 (two) times daily.    [provider]  vitamin C (ASCORBIC ACID) 500 MG tablet Take 500 mg by mouth 2 (two) times daily.    [provider]  warfarin (COUMADIN) 5 MG tablet Take 7.5 mg by mouth daily at 6 PM.     [provider]    Allergies Codeine; Meperidine; Metoclopramide hcl; Oxycodone; Stadol  [butorphanol]; Tussionex pennkinetic er [employer] polst-cpm polst er]; and Carbocaine  [mepivacaine  hcl]    Social History Social History   Tobacco Use  . Smoking status: Never Smoker  . Smokeless tobacco: Never Used  Substance Use Topics  . Alcohol use: No  . Drug use: No    Review of Systems Patient denies headaches, rhinorrhea, blurry vision, numbness, shortness of breath, chest pain, edema, cough, abdominal pain, nausea, vomiting, diarrhea, dysuria, fevers, rashes or hallucinations unless otherwise stated above in HPI. ____________________________________________   PHYSICAL EXAM:  VITAL SIGNS: Vitals:   05/28/18 1708 05/28/18 1830  BP: (!) 141/67 (!) 168/69  Pulse: 64   Resp: 16 15  Temp: 97.7 F (36.5 C)   SpO2: 100%     Constitutional: anxious and tearful,  Having difficulty completing sentences.   Eyes: Conjunctivae are normal.  Head: Atraumatic. Nose: No congestion/rhinnorhea. Mouth/Throat: Mucous membranes are moist.   Neck: No stridor. Painless ROM.  Cardiovascular: Normal rate, regular rhythm. Grossly normal heart sounds.  Good peripheral circulation. Respiratory: Normal respiratory effort.  No retractions. Lungs CTAB. Gastrointestinal: Soft and nontender. No distention. No abdominal bruits. No CVA tenderness. Genitourinary:  Musculoskeletal: No lower extremity tenderness nor edema.  No joint effusions. Neurologic:  Dysarthric speech,  . No facial droop, subjective decrease to light touch in RUE.  + right temporal visual field cut.  Mae spontaneously.  5/5 strength throughout Skin:  Skin is warm, dry and intact. No rash noted. Psychiatric: Mood and affect are normal. Speech and behavior are normal.  ____________________________________________   LABS (all labs ordered are listed, but only abnormal results are displayed)  Results for orders placed or performed during the hospital encounter of 05/28/18 (from the past 24 hour(s))  Protime-INR     Status: Abnormal   Collection Time: 05/28/18  5:31 PM  Result Value Ref Range   Prothrombin Time 18.7 (H)  11.4 - 15.2 seconds   INR 1.58   APTT     Status: Abnormal   Collection Time: 05/28/18  5:31 PM  Result Value Ref Range   aPTT 37 (H) 24 - 36 seconds  CBC     Status: Abnormal   Collection Time: 05/28/18  5:31 PM  Result Value Ref Range   WBC 6.1 4.0 - 10.5 K/uL   RBC 3.83 (L) 3.87 - 5.11 MIL/uL   Hemoglobin 12.1 12.0 - 15.0 g/dL   HCT 36.6 36.0 - 46.0 %   MCV 95.6 80.0 - 100.0 fL   MCH 31.6 26.0 - 34.0 pg   MCHC 33.1 30.0 - 36.0 g/dL   RDW 12.2 11.5 - 15.5 %   Platelets 233 150 - 400 K/uL   nRBC 0.0 0.0 - 0.2 %  Differential     Status: None   Collection Time: 05/28/18  5:31 PM  Result Value Ref Range   Neutrophils Relative % 54 %   Neutro Abs 3.3 1.7 - 7.7 K/uL   Lymphocytes Relative 27 %   Lymphs Abs 1.6 0.7 - 4.0 K/uL   Monocytes Relative 13 %   Monocytes Absolute 0.8 0.1 - 1.0 K/uL   Eosinophils Relative 5 %   Eosinophils Absolute 0.3 0.0 - 0.5 K/uL   Basophils Relative 1 %   Basophils Absolute 0.0 0.0 - 0.1 K/uL   Immature Granulocytes 0 %   Abs Immature Granulocytes 0.01 0.00 - 0.07 K/uL  Comprehensive metabolic panel     Status: Abnormal   Collection Time: 05/28/18  5:31 PM  Result Value Ref Range   Sodium 142 135 - 145 mmol/L   Potassium 4.5 3.5 - 5.1 mmol/L   Chloride 103 98 - 111 mmol/L   CO2 32 22 - 32 mmol/L   Glucose, Bld 63 (L) 70 - 99 mg/dL   BUN 24 (H) 8 - 23 mg/dL   Creatinine, Ser 0.57 0.44 - 1.00 mg/dL   Calcium 10.1 8.9 - 10.3 mg/dL   Total Protein 7.0 6.5 - 8.1 g/dL   Albumin 4.6 3.5 - 5.0 g/dL   AST 28 15 - 41 U/L   ALT 20 0 - 44 U/L   Alkaline Phosphatase 65 38 -  126 U/L   Total Bilirubin 0.4 0.3 - 1.2 mg/dL   GFR calc non Af Amer >60 >60 mL/min   GFR calc Af Amer >60 >60 mL/min   Anion gap 7 5 - 15  Troponin I     Status: None   Collection Time: 05/28/18  5:31 PM  Result Value Ref Range   Troponin I <0.03 <0.03 ng/mL  Glucose, capillary     Status: Abnormal   Collection Time: 05/28/18  5:56 PM  Result Value Ref Range    Glucose-Capillary 44 (LL) 70 - 99 mg/dL   Comment 1 Repeat Test   Glucose, capillary     Status: Abnormal   Collection Time: 05/28/18  5:57 PM  Result Value Ref Range   Glucose-Capillary 44 (LL) 70 - 99 mg/dL  Glucose, capillary     Status: Abnormal   Collection Time: 05/28/18  6:33 PM  Result Value Ref Range   Glucose-Capillary 102 (H) 70 - 99 mg/dL   ____________________________________________  EKG My review and personal interpretation at Time: 17:39   Indication: chest pain  Rate: 65  Rhythm: sinus Axis: normal Other: normal intervals, no stemi ____________________________________________  RADIOLOGY  I personally reviewed all radiographic images ordered to evaluate for the above acute complaints and reviewed radiology reports and findings.  These findings were personally discussed with the patient.  Please see medical record for radiology report.  ____________________________________________   PROCEDURES  Procedure(s) performed:  Procedures    Critical Care performed: no ____________________________________________   INITIAL IMPRESSION / ASSESSMENT AND PLAN / ED COURSE  Pertinent labs & imaging results that were available during my care of the patient were reviewed by me and considered in my medical decision making (see chart for details).   DDX: cva, tia, hypoglycemia, dehydration, electrolyte abnormality, dissection, sepsis   Erin Daniels is a 72 y.o. who presents to the ED with sx as described above.  Clinical Course as of May 28 1846  Tue May 28, 2018  1801 Upon evaluating patient I immediately checked a CBG which was 37.  Patient was given oral glucose and IV was established and given D50.  However on review of the patient's presentation and review of glucoses managed by her husband her blood sugars have been fairly normal throughout the day including 88 at time of loss of vision at 3 AM last night.   [PR]    Clinical Course User Index [PR] Merlyn Lot, MD     As part of my medical decision making, I reviewed the following data within the [place] notes reviewed and incorporated, Labs reviewed, notes from prior ED visits and [place] Controlled Substance Database   ____________________________________________   FINAL CLINICAL IMPRESSION(S) / ED DIAGNOSES  Final diagnoses:  Visual field cut      NEW MEDICATIONS STARTED DURING THIS VISIT:  New Prescriptions   No medications on file     Note:  This document was prepared using Dragon voice recognition software and may include unintentional dictation errors.    Merlyn Lot, MD 05/28/18 [phone]

## 2018-05-28 NOTE — ED Notes (Signed)
Pt is noted to be highly upset regarding admission. Pt and husband agitated at this time. Pt intermittently tearful during exam and with this RN. Will continue to monitor. This RN updated patient on plan of care and reasoning for the tests and admission. Pt states understanding at this time.

## 2018-05-28 NOTE — ED Notes (Signed)
Cole RN, aware of bed assigned  

## 2018-05-28 NOTE — H&P (Signed)
[place] [facility] [place] NAME: Erin Daniels    MR#:  [id number]  DATE OF BIRTH:  April 15, 1946  DATE OF ADMISSION:  05/28/2018  PRIMARY CARE PHYSICIAN: Juluis Pitch, MD   REQUESTING/REFERRING PHYSICIAN: Dr. Merlyn Lot  CHIEF COMPLAINT:   Chief Complaint  Patient presents with  . Loss of Vision    HISTORY OF PRESENT ILLNESS:  Erin Daniels  is a 72 y.o. female with a known history of type I diabetes mellitus on insulin pump, recurrent strokes on Coumadin, hypertension, hypothyroidism, diabetic retinopathy and neuropathy, gastroparesis presents to hospital secondary to visual loss. Patient is somewhat confused this evening.  According to husband, she woke up and was not herself this morning.  She poured water in her cereal which she is not normal for her.  Then she started describing blurred vision especially in her right eye and inability to see on the right side.  They made an urgent appointment with ophthalmology.  When they got evaluated by ophthalmology, they were advised to come to the emergency room for possible evaluation of stroke as there was no other evidence of peripheral causes of visual loss.  Patient has had previous strokes, the last one being in March 2019 when she was admitted here.  She has been placed on Coumadin due to her repeat strokes even though there is no evidence of any cardiac arrhythmia or A. fib noted.  She was also evaluated recently by [facility] hematology to see if there is any clotting disorders and according to the family, none of them were diagnosed and the hematologist recommended that she will does not need Coumadin for that. Her CT of the head showing old strokes.  MRI is pending.  Her INR is subtherapeutic at 1.5.  Other than Coumadin she is also on aspirin.  Patient denies any slurred speech, no tingling or numbness and no other symptoms.  Husband states she is still confused but much improved than this  morning.  PAST MEDICAL HISTORY:   Past Medical History:  Diagnosis Date  . At high risk for falls   . Basal cell carcinoma    right upper leg  . Complication of anesthesia    difficult to arouse  . Diabetes mellitus without complication (HCC)    Type I  . Diabetic neuropathy ([place])   . Diabetic retinopathy ([place])   . Dizziness   . Gastroparesis   . Herniated disc, cervical   . Hypercholesteremia   . Hypertension   . Hypotensive episode   . Hypothyroidism   . Insulin pump in place   . Myofascial muscle pain   . Numbness of arm    left  . Osteoporosis   . Poor vision   . Seizures ([place])   . Slurred speech   . 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 Stroke (HCC)    x5  . Vertigo   . Wears glasses     PAST SURGICAL HISTORY:   Past Surgical History:  Procedure Laterality Date  . APPENDECTOMY    . AUGMENTATION MAMMAPLASTY Bilateral 2011  . BILATERAL CARPAL TUNNEL RELEASE    . COLONOSCOPY    . EYE SURGERY    . HEMORROIDECTOMY    . HERNIA REPAIR    . JOINT REPLACEMENT    . OPEN REDUCTION INTERNAL FIXATION (ORIF) DISTAL RADIAL FRACTURE Right 05/29/2015   Procedure: OPEN REDUCTION INTERNAL FIXATION (ORIF) RIGHT DISTAL RADIUS FRACTURE WITH ALLOGRAFT BONE GRAFTING FOR REPAIR AND  RECONSTRUCTION;  Surgeon: Roseanne Kaufman, MD;  Location: [facility];  Service: Orthopedics;  Laterality: Right;  . SHOULDER ARTHROSCOPY Left   . SHOULDER HEMI-ARTHROPLASTY Left   . SYNOVECTOMY    . TONSILLECTOMY    . TRIGGER FINGER RELEASE    . TUBAL LIGATION      SOCIAL HISTORY:   Social History   Tobacco Use  . Smoking status: Never Smoker  . Smokeless tobacco: Never Used  Substance Use Topics  . Alcohol use: No    FAMILY HISTORY:   Family History  Problem Relation Age of Onset  . Hypertension Mother   . Stroke Mother   . Heart failure Father     DRUG ALLERGIES:   Allergies  Allergen Reactions  . Codeine Nausea Only    Can take Vicodin and Percocet   . Meperidine Nausea Only  .  Metoclopramide Hcl Nausea Only  . Oxycodone Nausea Only  . Stadol  [Butorphanol] Nausea Only  . Tussionex Pennkinetic Er [Hydrocod Polst-Cpm Polst Er] Other (See Comments)    Filled patient with sugar   . Carbocaine  [Mepivacaine Hcl] Palpitations    REVIEW OF SYSTEMS:   Review of Systems  Constitutional: Negative for chills, fever, malaise/fatigue and weight loss.  HENT: Negative for ear discharge, ear pain, hearing loss, nosebleeds and tinnitus.   Eyes: Positive for blurred vision. Negative for double vision and photophobia.  Respiratory: Negative for cough, hemoptysis, shortness of breath and wheezing.   Cardiovascular: Negative for chest pain, palpitations, orthopnea and leg swelling.  Gastrointestinal: Negative for abdominal pain, constipation, diarrhea, heartburn, melena, nausea and vomiting.  Genitourinary: Negative for dysuria, frequency, hematuria and urgency.  Musculoskeletal: Negative for back pain, myalgias and neck pain.  Skin: Negative for rash.  Neurological: Negative for dizziness, tingling, tremors, sensory change, speech change, focal weakness and headaches.  Endo/Heme/Allergies: Does not bruise/bleed easily.  Psychiatric/Behavioral: Negative for depression.       Confusion    MEDICATIONS AT HOME:   Prior to Admission medications   Medication Sig Start Date End Date Taking? Authorizing Provider  aspirin 325 MG EC tablet Take 325 mg by mouth daily with breakfast.   Yes [provider]  atorvastatin (LIPITOR) 40 MG tablet Take 1 tablet (40 mg total) by mouth daily at 6 PM. Patient taking differently: Take 40 mg by mouth every evening.  11/13/17  Yes Demetrios Loll, MD  butalbital-acetaminophen-caffeine (FIORICET, ESGIC) [phone] MG tablet Take 0.5-1 tablets by mouth 2 (two) times daily as needed for headache. 02/07/18  Yes Gillis Santa, MD  Calcium Carb-Cholecalciferol (CALCIUM 600 + D) 600-200 MG-UNIT TABS Take 1 tablet by mouth every evening.   Yes [provider]  carboxymethylcellulose (REFRESH PLUS) 0.5 % SOLN Place 1 drop into both eyes 3 (three) times daily as needed (for dry eyes).    Yes [provider]  cetirizine (ZYRTEC) 10 MG tablet Take 10 mg by mouth daily.   Yes [provider]  Cholecalciferol (VITAMIN D-3) 5000 units TABS Take 5,000 Units by mouth daily.   Yes [provider]  ferrous sulfate 325 (65 FE) MG tablet Take 325 mg by mouth every evening.   Yes [provider]  gabapentin (NEURONTIN) 300 MG capsule 1 in the am, 1 at lunch and 3 at bedtime Patient taking differently: Take 300-900 mg by mouth See admin instructions. 300 mg every morning, 300 mg every lunchtime, and 900 mg at bedtime 04/11/18  Yes Lateef, Carlus Pavlov, MD  glucose 4 GM chewable tablet  Chew 1 tablet by mouth as needed for low blood sugar.   Yes [provider]  Insulin Human (INSULIN PUMP) SOLN Inject 0.4-4 each into the skin continuous. Uses Novolog. (Pt states she takes 20 units cont through out day.)   Yes [provider]  losartan (COZAAR) 50 MG tablet Take 50 mg by mouth every evening.    Yes [provider]  Multiple Vitamin (MULTIVITAMIN WITH MINERALS) TABS tablet Take 1 tablet by mouth every evening.   Yes [provider]  Polyethyl Glycol-Propyl Glycol (SYSTANE) 0.4-0.3 % SOLN Apply 1 drop to eye as needed (for dry eyes).    Yes [provider]  polyethylene glycol (MIRALAX / GLYCOLAX) packet Take 17 g by mouth every other day.   Yes [provider]  promethazine (PHENERGAN) 25 MG suppository Place 25 mg rectally every 6 (six) hours as needed for nausea or vomiting.   Yes [provider]  venlafaxine (EFFEXOR) 75 MG tablet Take 75 mg by mouth 2 (two) times daily.   Yes [provider]  vitamin C (ASCORBIC ACID) 500 MG tablet Take 500 mg by mouth 2 (two) times daily.   Yes [provider]  warfarin (COUMADIN) 5 MG tablet Take 7.5 mg by mouth  every evening.    Yes [provider]      VITAL SIGNS:  Blood pressure (!) 168/69, pulse 64, temperature 97.7 F (36.5 C), temperature source Oral, resp. rate 15, height 5\' 2"  (1.575 m), weight 45.8 kg, SpO2 100 %.  PHYSICAL EXAMINATION:   Physical Exam  GENERAL:  72 y.o.-year-old patient lying in the bed with no acute distress.  EYES: right eye dilated and minimal reaction to light, left pupil is 70mm and sluggish reaction to light.. No scleral icterus. Extraocular muscles intact.  HEENT: Head atraumatic, normocephalic. Oropharynx and nasopharynx clear.  NECK:  Supple, no jugular venous distention. No thyroid enlargement, no tenderness.  LUNGS: Normal breath sounds bilaterally, Decreased at the bases.  no wheezing, rales,rhonchi or crepitation. No use of accessory muscles of respiration. CARDIOVASCULAR: S1, S2 normal. No  rubs, or gallops. 2/6 systolic murmur present ABDOMEN: Soft, nontender, nondistended. Bowel sounds present. No organomegaly or mass.  EXTREMITIES: No pedal edema, cyanosis, or clubbing.  NEUROLOGIC: Cranial nerves II through XII are intact. Muscle strength 5/5 in all extremities. Sensation intact. Gait not checked.  PSYCHIATRIC: The patient is alert and oriented x 3. Intermittent confusion SKIN: No obvious rash, lesion, or ulcer.   LABORATORY PANEL:   CBC Recent Labs  Lab 05/28/18 1731  WBC 6.1  HGB 12.1  HCT 36.6  PLT 233   ------------------------------------------------------------------------------------------------------------------  Chemistries  Recent Labs  Lab 05/28/18 1731  NA 142  K 4.5  CL 103  CO2 32  GLUCOSE 63*  BUN 24*  CREATININE 0.57  CALCIUM 10.1  AST 28  ALT 20  ALKPHOS 65  BILITOT 0.4   ------------------------------------------------------------------------------------------------------------------  Cardiac Enzymes Recent Labs  Lab 05/28/18 1731  TROPONINI <0.03    ------------------------------------------------------------------------------------------------------------------  RADIOLOGY:  Ct Head Wo Contrast  Result Date: 05/28/2018 CLINICAL DATA:  Right eye vision loss EXAM: CT HEAD WITHOUT CONTRAST TECHNIQUE: Contiguous axial images were obtained from the base of the skull through the vertex without intravenous contrast. COMPARISON:  12/04/2017 FINDINGS: Brain: No evidence of acute infarction, hemorrhage, hydrocephalus, extra-axial collection or mass lesion/mass effect. Remote lacunar infarcts at the genu of the left internal capsule, brainstem, and in the bilateral thalamus. Chronic small vessel ischemic gliosis in the cerebral  white matter that is fairly mild. Generalized atrophy. Vascular: Atherosclerotic calcification. Skull: No acute finding Sinuses/Orbits: Bilateral cataract resection IMPRESSION: 1. No acute finding. 2. Multiple remote lacunar infarcts. Electronically Signed   By: Monte Fantasia M.D.   On: 05/28/2018 17:47    EKG:   Orders placed or performed during the hospital encounter of 05/28/18  . ED EKG  . ED EKG    IMPRESSION AND PLAN:   Erin Daniels  is a 72 y.o. female with a known history of type I diabetes mellitus on insulin pump, recurrent strokes on Coumadin, hypertension, hypothyroidism, diabetic retinopathy and neuropathy, gastroparesis presents to hospital secondary to visual loss.  1. CVA-right-sided visual loss.  Already seen by ophthalmologist. -Admit, CT head showing only old strokes. -CT angiogram ordered an MRI of the brain -Neurochecks and neurology consult -PT, OT and speech therapy consults -Patient already on aspirin and statin.  On Coumadin but no history of A. fib or cardiac arrhythmias. -Echo with bubble study ordered. -Subtherapeutic INR  2.  Diabetes mellitus-continue insulin pump.  Add sliding scale insulin.  3.  Depression anxiety-continue home medications  4.  Hypertension-on losartan  5.   DVT prophylaxis-patient already on Coumadin    All the records are reviewed and case discussed with ED provider. Management plans discussed with the patient, family and they are in agreement.  CODE STATUS: Full Code  TOTAL TIME TAKING CARE OF THIS PATIENT: 50 minutes.    Gladstone Lighter M.D on 05/28/2018 at 7:38 PM  Between 7am to 6pm - Pager - [phone]  After 6pm go to [email or web address] - password [email or web address] [facility]  Office  [phone]  CC: Primary care physician; Juluis Pitch, MD

## 2018-05-28 NOTE — ED Notes (Signed)
Patient transported to CT 

## 2018-05-28 NOTE — ED Triage Notes (Signed)
Pt in via POV; sent from eye doctor due to loss of vision to right eye w/ field cut per sticky note sent with patient.  Per husband, pt also with acute onset of confusion today.  Last known well time 0300 today, onset of symptoms upon waking this morning 0830.  Slurred speech noted upon arrival.  Vitals WDL.

## 2018-05-29 ENCOUNTER — Inpatient Hospital Stay: Payer: Medicare Other

## 2018-05-29 ENCOUNTER — Inpatient Hospital Stay (HOSPITAL_COMMUNITY)
Admit: 2018-05-29 | Discharge: 2018-05-29 | Disposition: A | Discharge status: Home / Self Care | Payer: Medicare Other | Attending: Internal Medicine | Admitting: Internal Medicine

## 2018-05-29 DIAGNOSIS — I10 Essential (primary) hypertension: Secondary | ICD-10-CM

## 2018-05-29 DIAGNOSIS — E10649 Type 1 diabetes mellitus with hypoglycemia without coma: Secondary | ICD-10-CM | POA: Diagnosis not present

## 2018-05-29 DIAGNOSIS — H5340 Unspecified visual field defects: Secondary | ICD-10-CM | POA: Diagnosis not present

## 2018-05-29 DIAGNOSIS — H5461 Unqualified visual loss, right eye, normal vision left eye: Secondary | ICD-10-CM | POA: Diagnosis not present

## 2018-05-29 LAB — LIPID PANEL
CHOL/HDL RATIO: 1.8 ratio
CHOLESTEROL: 148 mg/dL (ref 0–200)
HDL: 84 mg/dL (ref >40)
LDL CALC: 46 mg/dL (ref 0–99)
TRIGLYCERIDES: 88 mg/dL (ref <150)
VLDL: 18 mg/dL (ref 0–40)

## 2018-05-29 LAB — GLUCOSE, CAPILLARY
GLUCOSE-CAPILLARY: 499 mg/dL — AB (ref 70–99)
GLUCOSE-CAPILLARY: 373 mg/dL — AB (ref 70–99)
Glucose-Capillary: 174 mg/dL — ABNORMAL HIGH (ref 70–99)
Glucose-Capillary: 239 mg/dL — ABNORMAL HIGH (ref 70–99)
Glucose-Capillary: 403 mg/dL — ABNORMAL HIGH (ref 70–99)
Glucose-Capillary: 44 mg/dL — CL (ref 70–99)
Glucose-Capillary: 523 mg/dL (ref 70–99)

## 2018-05-29 LAB — BASIC METABOLIC PANEL
Anion gap: 10 (ref 5–15)
BUN: 19 mg/dL (ref 8–23)
CALCIUM: 9.2 mg/dL (ref 8.9–10.3)
CO2: 28 mmol/L (ref 22–32)
CREATININE: 0.5 mg/dL (ref 0.44–1.00)
Chloride: 99 mmol/L (ref 98–111)
GLUCOSE: 372 mg/dL — AB (ref 70–99)
Potassium: 3.9 mmol/L (ref 3.5–5.1)
Sodium: 137 mmol/L (ref 135–145)

## 2018-05-29 LAB — PROTIME-INR
INR: 1.67
PROTHROMBIN TIME: 19.6 s — AB (ref 11.4–15.2)

## 2018-05-29 LAB — CBC
HEMATOCRIT: 32.9 % — AB (ref 36.0–46.0)
Hemoglobin: 11 g/dL — ABNORMAL LOW (ref 12.0–15.0)
MCH: 31.3 pg (ref 26.0–34.0)
MCHC: 33.4 g/dL (ref 30.0–36.0)
MCV: 93.7 fL (ref 80.0–100.0)
NRBC: 0 % (ref 0.0–0.2)
PLATELETS: 201 10*3/uL (ref 150–400)
RBC: 3.51 MIL/uL — ABNORMAL LOW (ref 3.87–5.11)
RDW: 12 % (ref 11.5–15.5)
WBC: 5.8 10*3/uL (ref 4.0–10.5)

## 2018-05-29 LAB — HEMOGLOBIN A1C
Hgb A1c MFr Bld: 6.9 % — ABNORMAL HIGH (ref 4.8–5.6)
Mean Plasma Glucose: 151.33 mg/dL

## 2018-05-29 MED ORDER — ASPIRIN EC 81 MG PO TBEC: 81.0000 mg | DELAYED_RELEASE_TABLET | Freq: Every day | ORAL | Status: DC

## 2018-05-29 MED ORDER — INSULIN ASPART 100 UNIT/ML ~~LOC~~ SOLN
6.0000 [IU] | Freq: Once | SUBCUTANEOUS | Status: AC
  Administered 2018-05-29: 6 [IU] via SUBCUTANEOUS
  Filled 2018-05-29: qty 1

## 2018-05-29 MED ORDER — WARFARIN SODIUM 7.5 MG PO TABS
7.5000 mg | ORAL_TABLET | Freq: Once | ORAL | Status: DC
  Filled 2018-05-29: qty 1

## 2018-05-29 MED ORDER — INSULIN ASPART 100 UNIT/ML ~~LOC~~ SOLN: 12.0000 [IU] | Freq: Once | SUBCUTANEOUS | Status: DC

## 2018-05-29 MED ORDER — INSULIN PUMP
Freq: Three times a day (TID) | SUBCUTANEOUS | Status: DC
  Administered 2018-05-29: 3.05 via SUBCUTANEOUS
  Filled 2018-05-29: qty 1

## 2018-05-29 NOTE — Consult Note (Signed)
Referring Physician: Gladstone Lighter, MD    Chief Complaint: Right eye vision loss and confusion  HPI: Erin Daniels is an 72 y.o. female with pertinent history of diabetes mellitus type 1 with complications on insulin pump, recurrent strokes on Coumadin, hypertension, hypothyroidism, diabetic retinopathy and neuropathy, gastroparesis, presenting to the ED on 05/28/2018 with right vision loss and confusion. Patient describes episode as feeling of curtain drawn in that eye without pain or associated symptoms. She was evaluated by her ophthalmologist at [facility] who felt that she needed to be evaluated in the ED for possible stroke.  Patient denies aura, nausea, vomiting, photophobia, phono phobia, syncope, Ipsilateral or contralateral paralysis/weakness, numbness or tingling, involuntary movements, tremor, speech or gait impairment.  No symptoms of eye redness or pain and tearing associated with visual loss.  Per husband who provides history today, patient had not been acting her normal self that morning. He noticed that she was having difficulty forming sentences and putting things in the wrong places.  He checked her blood sugar thinking that this may be the cause of her symptoms, however he noted that it was 88 so he called her eye doctor. Patient states that she normally receives eye injection in the right eye and a just received an injection about a week prior to this event.  Work-up in the ED with CT head did not show acute intracranial abnormality.  Follow-up MRI brain was also negative with no abnormality noted.  Due to her prior strokes and multiple risk factors patient was admitted for further stroke work-up.  Initial NIH stroke scale 1.   Date last known well: Date: 05/28/2018 Time last known well: Unable to determine tPA Given: No: NIHSS of 1  Past Medical History:  Diagnosis Date  . At high risk for falls   . Basal cell carcinoma    right upper leg  . Complication of  anesthesia    difficult to arouse  . Diabetes mellitus without complication (HCC)    Type I  . Diabetic neuropathy ([place])   . Diabetic retinopathy ([place])   . Dizziness   . Gastroparesis   . Herniated disc, cervical   . Hypercholesteremia   . Hypertension   . Hypotensive episode   . Hypothyroidism   . Insulin pump in place   . Myofascial muscle pain   . Numbness of arm    left  . Osteoporosis   . Poor vision   . Seizures ([place])   . Slurred speech   . Stroke (cerebrum) ([place])    4/19  . Stroke (HCC)    x5  . Vertigo   . Wears glasses     Past Surgical History:  Procedure Laterality Date  . APPENDECTOMY    . AUGMENTATION MAMMAPLASTY Bilateral 2011  . BILATERAL CARPAL TUNNEL RELEASE    . COLONOSCOPY    . EYE SURGERY    . HEMORROIDECTOMY    . HERNIA REPAIR    . JOINT REPLACEMENT    . OPEN REDUCTION INTERNAL FIXATION (ORIF) DISTAL RADIAL FRACTURE Right 05/29/2015   Procedure: OPEN REDUCTION INTERNAL FIXATION (ORIF) RIGHT DISTAL RADIUS FRACTURE WITH ALLOGRAFT BONE GRAFTING FOR REPAIR AND RECONSTRUCTION;  Surgeon: Roseanne Kaufman, MD;  Location: [place];  Service: Orthopedics;  Laterality: Right;  . SHOULDER ARTHROSCOPY Left   . SHOULDER HEMI-ARTHROPLASTY Left   . SYNOVECTOMY    . TONSILLECTOMY    . TRIGGER FINGER RELEASE    . TUBAL LIGATION      Family History  Problem Relation Age of Onset  . Hypertension Mother   . Stroke Mother   . Heart failure Father    Social History:  reports that she has never smoked. She has never used smokeless tobacco. She reports that she does not drink alcohol or use drugs.  Allergies:  Allergies  Allergen Reactions  . Codeine Nausea Only    Can take Vicodin and Percocet   . Meperidine Nausea Only  . Metoclopramide Hcl Nausea Only  . Oxycodone Nausea Only  . Stadol  [Butorphanol] Nausea Only  . Tussionex Pennkinetic Er [Hydrocod Polst-Cpm Polst Er] Other (See Comments)    Filled patient with sugar   . Carbocaine  [Mepivacaine Hcl]  Palpitations    Medications:  I have reviewed the patient's current medications. Prior to Admission:  Medications Prior to Admission  Medication Sig Dispense Refill Last Dose  . aspirin 325 MG EC tablet Take 325 mg by mouth daily with breakfast.   05/28/2018 at am  . atorvastatin (LIPITOR) 40 MG tablet Take 1 tablet (40 mg total) by mouth daily at 6 PM. (Patient taking differently: Take 40 mg by mouth every evening. ) 30 tablet 1 05/27/2018 at pm  . butalbital-acetaminophen-caffeine (FIORICET, ESGIC) 50-325-40 MG tablet Take 0.5-1 tablets by mouth 2 (two) times daily as needed for headache. 60 tablet 5 unknown at unknown  . Calcium Carb-Cholecalciferol (CALCIUM 600 + D) 600-200 MG-UNIT TABS Take 1 tablet by mouth every evening.   05/27/2018 at pm  . carboxymethylcellulose (REFRESH PLUS) 0.5 % SOLN Place 1 drop into both eyes 3 (three) times daily as needed (for dry eyes).    unknown at unknown  . cetirizine (ZYRTEC) 10 MG tablet Take 10 mg by mouth daily.   05/28/2018 at am  . Cholecalciferol (VITAMIN D-3) 5000 units TABS Take 5,000 Units by mouth daily.   05/28/2018 at am  . ferrous sulfate 325 (65 FE) MG tablet Take 325 mg by mouth every evening.   05/27/2018 at pm  . gabapentin (NEURONTIN) 300 MG capsule 1 in the am, 1 at lunch and 3 at bedtime (Patient taking differently: Take 300-900 mg by mouth See admin instructions. 300 mg every morning, 300 mg every lunchtime, and 900 mg at bedtime) 150 capsule 5 05/28/2018 at am  . glucose 4 GM chewable tablet Chew 1 tablet by mouth as needed for low blood sugar.    at unknown  . Insulin Human (INSULIN PUMP) SOLN Inject 0.4-4 each into the skin continuous. Uses Novolog. (Pt states she takes 20 units cont through out day.)   Taking  . losartan (COZAAR) 50 MG tablet Take 50 mg by mouth every evening.    05/27/2018 at pm  . Multiple Vitamin (MULTIVITAMIN WITH MINERALS) TABS tablet Take 1 tablet by mouth every evening.   05/27/2018 at pm  . Polyethyl Glycol-Propyl  Glycol (SYSTANE) 0.4-0.3 % SOLN Apply 1 drop to eye as needed (for dry eyes).    unknown at unknown  . polyethylene glycol (MIRALAX / GLYCOLAX) packet Take 17 g by mouth every other day.   05/25/2018 at am  . promethazine (PHENERGAN) 25 MG suppository Place 25 mg rectally every 6 (six) hours as needed for nausea or vomiting.   unknown at unknown  . venlafaxine (EFFEXOR) 75 MG tablet Take 75 mg by mouth 2 (two) times daily.   05/28/2018 at am  . vitamin C (ASCORBIC ACID) 500 MG tablet Take 500 mg by mouth 2 (two) times daily.   05/28/2018 at am  .  warfarin (COUMADIN) 5 MG tablet Take 7.5 mg by mouth every evening.    05/27/2018 at 1800   Scheduled: . aspirin  325 mg Oral Q breakfast  . atorvastatin  40 mg Oral QPM  . calcium-vitamin D  1 tablet Oral QPM  . cholecalciferol  5,000 Units Oral Daily  . ferrous sulfate  325 mg Oral QPM  . gabapentin  300 mg Oral BID WC  . gabapentin  900 mg Oral QHS  . insulin pump   Subcutaneous TID AC, HS, 0200  . loratadine  10 mg Oral Daily  . losartan  50 mg Oral QPM  . multivitamin with minerals  1 tablet Oral QPM  . polyethylene glycol  17 g Oral QODAY  . venlafaxine  75 mg Oral BID  . vitamin C  500 mg Oral BID  . warfarin  7.5 mg Oral ONCE-1800  . Warfarin - Pharmacist Dosing Inpatient   Does not apply q1800    ROS: History obtained from the patient   General ROS: negative for - chills, fatigue, fever, night sweats, weight gain or weight loss Psychological ROS: negative for - behavioral disorder, hallucinations, memory difficulties, mood swings or suicidal ideation Ophthalmic ROS: positive for - blurry vision,  loss of vision ENT ROS: negative for - epistaxis, nasal discharge, oral lesions, sore throat, tinnitus or vertigo Allergy and Immunology ROS: negative for - hives or itchy/watery eyes Hematological and Lymphatic ROS: negative for - bleeding problems, bruising or swollen lymph nodes Endocrine ROS: negative for - galactorrhea, hair pattern  changes, polydipsia/polyuria or temperature intolerance Respiratory ROS: negative for - cough, hemoptysis, shortness of breath or wheezing Cardiovascular ROS: negative for - chest pain, dyspnea on exertion, edema or irregular heartbeat Gastrointestinal ROS: negative for - abdominal pain, diarrhea, hematemesis, nausea/vomiting or stool incontinence Genito-Urinary ROS: negative for - dysuria, hematuria, incontinence or urinary frequency/urgency Musculoskeletal ROS: negative for - joint swelling or muscular weakness Neurological ROS: as noted in HPI Dermatological ROS: negative for rash and skin lesion changes  Physical Examination: Blood pressure (!) 164/60, pulse 63, temperature (!) 97.5 F (36.4 C), temperature source Oral, resp. rate 18, height 5\' 2"  (1.575 m), weight 45.8 kg, SpO2 98 %.   HEENT-  Normocephalic, no lesions, without obvious abnormality.  Normal external eye and conjunctiva.  Normal TM's bilaterally.  Normal auditory canals and external ears. Normal external nose, mucus membranes and septum.  Normal pharynx. Cardiovascular- S1, S2 normal, pulses palpable throughout   Lungs- chest clear, no wheezing, rales, normal symmetric air entry Abdomen- soft, non-tender; bowel sounds normal; no masses,  no organomegaly Extremities- no edema Lymph-no adenopathy palpable Musculoskeletal-no joint tenderness, deformity or swelling Skin-warm and dry, no hyperpigmentation, vitiligo, or suspicious lesions  Neurological Exam   Mental Status: Alert, oriented, thought content appropriate.  Speech fluent without evidence of aphasia.  Able to follow 3 step commands without difficulty. Attention span and concentration seemed appropriate  Cranial Nerves: II: Discs flat bilaterally; Visual fields cuts on the right, pupils equal, round, reactive to light and accommodation III,IV, VI: ptosis not present, extra-ocular motions intact bilaterally, saccadic eye movements V,VII: smile symmetric, facial  light touch sensation intact VIII: hearing normal bilaterally IX,X: gag reflex deferred XI: bilateral shoulder shrug XII: midline tongue extension Motor: Right :  Upper extremity   5/5 Without pronator drift      Left: Upper extremity   5/5 without pronator drift Right:   Lower extremity   5/5  Left: Lower extremity   5/5 Tone and bulk:normal tone throughout; no atrophy noted Sensory: Pinprick and light touch intact bilaterally Deep Tendon Reflexes: 2+ and symmetric throughout Plantars: Right: mute                              Left: mute Cerebellar: Finger-to-nose testing intact bilaterally. Heel to shin testing normal bilaterally Gait: not tested due to safety concerns  Data Reviewed  Laboratory Studies:  Basic Metabolic Panel: Recent Labs  Lab 05/28/18 1731 05/29/18 0556  NA 142 137  K 4.5 3.9  CL 103 99  CO2 32 28  GLUCOSE 63* 372*  BUN 24* 19  CREATININE 0.57 0.50  CALCIUM 10.1 9.2    Liver Function Tests: Recent Labs  Lab 05/28/18 1731  AST 28  ALT 20  ALKPHOS 65  BILITOT 0.4  PROT 7.0  ALBUMIN 4.6   No results for input(s): LIPASE, AMYLASE in the last 168 hours. No results for input(s): AMMONIA in the last 168 hours.  CBC: Recent Labs  Lab 05/28/18 1731 05/29/18 0556  WBC 6.1 5.8  NEUTROABS 3.3  --   HGB 12.1 11.0*  HCT 36.6 32.9*  MCV 95.6 93.7  PLT 233 201    Cardiac Enzymes: Recent Labs  Lab 05/28/18 1731  TROPONINI <0.03    BNP: Invalid input(s): POCBNP  CBG: Recent Labs  Lab 05/28/18 1833 05/28/18 2133 05/29/18 0019 05/29/18 0235 05/29/18 0744  GLUCAP 102* 54* 174* 42* 56*    Microbiology: Results for orders placed or performed during the hospital encounter of 11/26/16  Blood Culture (routine x 2)     Status: None   Collection Time: 11/26/16  8:11 PM  Result Value Ref Range Status   Specimen Description BLOOD LEFT ANTECUBITAL  Final   Special Requests IN PEDIATRIC BOTTLE Blood  Culture adequate volume  Final   Culture NO GROWTH 5 DAYS  Final   Report Status 12/01/2016 FINAL  Final  Blood Culture (routine x 2)     Status: None   Collection Time: 11/26/16  8:39 PM  Result Value Ref Range Status   Specimen Description BLOOD RIGHT ARM  Final   Special Requests   Final    AEROBIC BOTTLE ONLY Blood Culture results may not be optimal due to an inadequate volume of blood received in culture bottles   Culture NO GROWTH 5 DAYS  Final   Report Status 12/01/2016 FINAL  Final  MRSA PCR Screening     Status: None   Collection Time: 11/27/16  5:07 PM  Result Value Ref Range Status   MRSA by PCR NEGATIVE NEGATIVE Final    Comment:        The GeneXpert MRSA Assay (FDA approved for NASAL specimens only), is one component of a comprehensive MRSA colonization surveillance program. It is not intended to diagnose MRSA infection nor to guide or monitor treatment for MRSA infections.     Coagulation Studies: Recent Labs    05/28/18 1731 05/29/18 0556  LABPROT 18.7* 19.6*  INR 1.58 1.67    Urinalysis: No results for input(s): COLORURINE, LABSPEC, PHURINE, GLUCOSEU, HGBUR, BILIRUBINUR, KETONESUR, PROTEINUR, UROBILINOGEN, NITRITE, LEUKOCYTESUR in the last 168 hours.  Invalid input(s): APPERANCEUR  Lipid Panel:    Component Value Date/Time   CHOL 148 05/29/2018 0556   TRIG 88 05/29/2018 0556   HDL 84 05/29/2018 0556   CHOLHDL 1.8 05/29/2018 0556   VLDL 18 05/29/2018 0556  LDLCALC 46 05/29/2018 0556    HgbA1C:  Lab Results  Component Value Date   HGBA1C 5.9 (H) 11/12/2017    Urine Drug Screen:      Component Value Date/Time   LABOPIA NONE DETECTED 03/31/2018 1706   COCAINSCRNUR NONE DETECTED 03/31/2018 1706   LABBENZ TEST NOT PERFORMED, REAGENT NOT AVAILABLE (A) 03/31/2018 1706   AMPHETMU NONE DETECTED 03/31/2018 1706   THCU NONE DETECTED 03/31/2018 1706   LABBARB POSITIVE (A) 03/31/2018 1706    Alcohol Level: No results for input(s): ETH in the last  168 hours.  Other results: EKG: normal sinus rhythm at 63 bpm.  Imaging: Ct Angio Head W Or Wo Contrast  Result Date: 05/28/2018 CLINICAL DATA:  Vision loss in the right eye.  Confusion. EXAM: CT ANGIOGRAPHY HEAD AND NECK TECHNIQUE: Multidetector CT imaging of the head and neck was performed using the standard protocol during bolus administration of intravenous contrast. Multiplanar CT image reconstructions and MIPs were obtained to evaluate the vascular anatomy. Carotid stenosis measurements (when applicable) are obtained utilizing NASCET criteria, using the distal internal carotid diameter as the denominator. CONTRAST:  20mL OMNIPAQUE IOHEXOL 350 MG/ML SOLN COMPARISON:  Head CT from earlier today FINDINGS: CTA NECK FINDINGS Aortic arch: Atherosclerotic plaque. No acute finding or dilatation. Three vessel branching. Right carotid system: Calcified plaque at the common carotid bifurcation without flow limiting stenosis or ulceration. The right ICA is smaller than the left in the setting of aplastic right A1 segment. Left carotid system: Calcified plaque to a mild degree for age at the common carotid bifurcation. No ulceration or stenosis. Vertebral arteries: No proximal subclavian flow limiting stenosis. The left vertebral artery is strongly dominant. Both vertebral arteries are smooth and widely patent to the dura Skeleton: Multilevel disc and facet degeneration with C3-4 anterolisthesis. Other neck: Enlarged lymph nodes in the right deep axilla, known from 11/16/2017 chest CT. Upper chest: No acute finding Review of the MIP images confirms the above findings CTA HEAD FINDINGS Anterior circulation: Confluent atherosclerotic plaque on the carotid siphons. No branch occlusion, beading, aneurysm, or flow limiting stenosis Posterior circulation: Strong left vertebral artery dominance. The right vertebral ends in PICA. The vertebral and basilar arteries are smooth and widely patent. No branch occlusion or flow  limiting stenosis. Venous sinuses: Patent Anatomic variants: As above Delayed phase: No abnormal intracranial enhancement Review of the MIP images confirms the above findings IMPRESSION: 1. No emergent finding. 2. Atherosclerosis without flow limiting stenosis in the head or neck. Electronically Signed   By: Monte Fantasia M.D.   On: 05/28/2018 20:05   Ct Head Wo Contrast  Result Date: 05/28/2018 CLINICAL DATA:  Right eye vision loss EXAM: CT HEAD WITHOUT CONTRAST TECHNIQUE: Contiguous axial images were obtained from the base of the skull through the vertex without intravenous contrast. COMPARISON:  12/04/2017 FINDINGS: Brain: No evidence of acute infarction, hemorrhage, hydrocephalus, extra-axial collection or mass lesion/mass effect. Remote lacunar infarcts at the genu of the left internal capsule, brainstem, and in the bilateral thalamus. Chronic small vessel ischemic gliosis in the cerebral white matter that is fairly mild. Generalized atrophy. Vascular: Atherosclerotic calcification. Skull: No acute finding Sinuses/Orbits: Bilateral cataract resection IMPRESSION: 1. No acute finding. 2. Multiple remote lacunar infarcts. Electronically Signed   By: Monte Fantasia M.D.   On: 05/28/2018 17:47   Ct Angio Neck W And/or Wo Contrast  Result Date: 05/28/2018 CLINICAL DATA:  Vision loss in the right eye.  Confusion. EXAM: CT ANGIOGRAPHY HEAD AND NECK TECHNIQUE: Multidetector  CT imaging of the head and neck was performed using the standard protocol during bolus administration of intravenous contrast. Multiplanar CT image reconstructions and MIPs were obtained to evaluate the vascular anatomy. Carotid stenosis measurements (when applicable) are obtained utilizing NASCET criteria, using the distal internal carotid diameter as the denominator. CONTRAST:  39mL OMNIPAQUE IOHEXOL 350 MG/ML SOLN COMPARISON:  Head CT from earlier today FINDINGS: CTA NECK FINDINGS Aortic arch: Atherosclerotic plaque. No acute finding or  dilatation. Three vessel branching. Right carotid system: Calcified plaque at the common carotid bifurcation without flow limiting stenosis or ulceration. The right ICA is smaller than the left in the setting of aplastic right A1 segment. Left carotid system: Calcified plaque to a mild degree for age at the common carotid bifurcation. No ulceration or stenosis. Vertebral arteries: No proximal subclavian flow limiting stenosis. The left vertebral artery is strongly dominant. Both vertebral arteries are smooth and widely patent to the dura Skeleton: Multilevel disc and facet degeneration with C3-4 anterolisthesis. Other neck: Enlarged lymph nodes in the right deep axilla, known from 11/16/2017 chest CT. Upper chest: No acute finding Review of the MIP images confirms the above findings CTA HEAD FINDINGS Anterior circulation: Confluent atherosclerotic plaque on the carotid siphons. No branch occlusion, beading, aneurysm, or flow limiting stenosis Posterior circulation: Strong left vertebral artery dominance. The right vertebral ends in PICA. The vertebral and basilar arteries are smooth and widely patent. No branch occlusion or flow limiting stenosis. Venous sinuses: Patent Anatomic variants: As above Delayed phase: No abnormal intracranial enhancement Review of the MIP images confirms the above findings IMPRESSION: 1. No emergent finding. 2. Atherosclerosis without flow limiting stenosis in the head or neck. Electronically Signed   By: Monte Fantasia M.D.   On: 05/28/2018 20:05   Mr Brain Wo Contrast  Result Date: 05/29/2018 CLINICAL DATA:  Vision loss. EXAM: MRI HEAD WITHOUT CONTRAST TECHNIQUE: Multiplanar, multiecho pulse sequences of the brain and surrounding structures were obtained without intravenous contrast. COMPARISON:  CTA head neck 05/28/2018 FINDINGS: BRAIN: There is no acute infarct, acute hemorrhage, hydrocephalus or extra-axial collection. The midline structures are normal. No midline shift or other  mass effect. Old left basal ganglia and pontine lacunar infarcts. Early confluent hyperintense T2-weighted signal of the periventricular and deep white matter, most commonly due to chronic ischemic microangiopathy. Generalized atrophy without lobar predilection. Old focus of chronic microhemorrhage in the right parietal white matter. VASCULAR: Major intracranial arterial and venous sinus flow voids are normal. SKULL AND UPPER CERVICAL SPINE: Calvarial bone marrow signal is normal. There is no skull base mass. Visualized upper cervical spine and soft tissues are normal. SINUSES/ORBITS: No fluid levels or advanced mucosal thickening. No mastoid or middle ear effusion. The orbits are normal. IMPRESSION: 1. No acute abnormality. 2. 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 Electronically Signed   By: Ulyses Jarred M.D.   On: 05/29/2018 03:28    Patient seen and examined.  Clinical course and management discussed.  Necessary edits performed.  I agree with the above.  Assessment and plan of care developed and discussed below.     Assessment: 72 y.o. female pertinent history of diabetes mellitus type 1 with complications on insulin pump, recurrent strokes on Coumadin, hypertension, hypothyroidism, diabetic retinopathy and neuropathy, gastroparesis, presenting  with right vision loss and confusion.  MRI brain reviewed and shows no acute intracranial changes.  Prior infarcts in the left basal ganglia and pontine noted.  CTA head and neck negative for large vessel occlusion.  Hemoglobin A1c 6.9, LDL 46.  Patient was on Coumadin and ASA prior to this event with a sub-therapeutic INR of 1.58.  Stroke Risk Factors - diabetes mellitus, hyperlipidemia and hypertension  Plan: 1. Continue Coumadin with target INR between 2 and 3.  Unclear why patient is on ASA and Coumadin but would consider decrease in ASA to 81mg .  Patient to follow up with her physician on her indication for  anticoagulation.   2. PT consult, OT consult, Speech consult 3. Echocardiogram pending 4. Frequent neuro checks 5. NPO until RN stroke swallow screen 6. Telemetry monitoring 9. Frequent neuro checks 10. Patient to follow up with neurology and ophthalmology on an outpatient basis   This patient was staffed with Dr. Magda Paganini, Doy Mince who personally evaluated patient, reviewed documentation and agreed with assessment and plan of care as above.  Rufina Falco, DNP, FNP-BC Board certified Nurse Practitioner Neurology Department  05/29/2018, 9:55 AM   Alexis Goodell, MD Neurology [phone]  05/29/2018  2:44 PM

## 2018-05-29 NOTE — Consult Note (Signed)
[facility] for warfarin dosing Indication: h/o CVA  Allergies  Allergen Reactions  . Codeine Nausea Only    Can take Vicodin and Percocet   . Meperidine Nausea Only  . Metoclopramide Hcl Nausea Only  . Oxycodone Nausea Only  . Stadol  [Butorphanol] Nausea Only  . Tussionex Pennkinetic Er [Hydrocod Polst-Cpm Polst Er] Other (See Comments)    Filled patient with sugar   . Carbocaine  [Mepivacaine Hcl] Palpitations    Patient Measurements: Height: 5\' 2"  (157.5 cm) Weight: 101 lb (45.8 kg) IBW/kg (Calculated) : 50.1  Labs: Recent Labs    05/28/18 1731 05/29/18 0556  HGB 12.1 11.0*  HCT 36.6 32.9*  PLT 233 201  APTT 37*  --   LABPROT 18.7* 19.6*  INR 1.58 1.67  CREATININE 0.57 0.50  TROPONINI <0.03  --     Estimated Creatinine Clearance: 44.6 mL/min (by C-G formula based on SCr of 0.5 mg/dL).  Assessment: 72 y.o. female h/o type I diabetes mellitus on insulin pump, recurrent strokes on Coumadin, hypertension, hypothyroidism, diabetic retinopathy and neuropathy, gastroparesis presents to hospital secondary to visual loss. She confirms she is on 7.5mg  warfarin daily at home with last dose 10/7. She currently has a subtherapeutic INR. She was here in late March of this year but did not receive enough doses to guide therapy  Date INR Dose 10/8  1.58 10 mg 10/9 1.67  Goal of Therapy:  INR 2-3 Monitor platelets by anticoagulation protocol: Yes   Plan:  INR trending up, will order warfarin 7.5 mg (home dose) for tonight as it can take ~2 days to see full effect of doses.  Follow up INR in AM Will need CBC at least every three days per policy  Pharmacy will continue to follow.   Rocky Morel, PharmD 05/29/2018,8:47 AM

## 2018-05-29 NOTE — Progress Notes (Signed)
OT Cancellation Note  Patient Details Name: Erin Daniels MRN: [id number] DOB: 29-Nov-1945   Cancelled Treatment:    Reason Eval/Treat Not Completed: Medical issues which prohibited therapy. Order received, chart reviewed. Pt noted with elevated BG this date, now 499 as of 10:28am. Noted to have received 6 units of insulin at 9:11am. Will hold OT evaluation at this time and re-attempt as pt is medically appropriate.   Jeni Salles, MPH, MS, OTR/L ascom [phone] 05/29/18, 11:28 AM

## 2018-05-29 NOTE — Progress Notes (Signed)
Pt removed insulin pump for MRI purposes. MD made aware. Insulin orders already in place.

## 2018-05-29 NOTE — Progress Notes (Signed)
PT Cancellation Note  Patient Details Name: Erin Daniels MRN: [id number] DOB: [birth year]   Cancelled Treatment:    Reason Eval/Treat Not Completed: Other (comment). Consult received and chart reviewed. Pt with elevated BG at 403 this date, to receive insulin shortly. Pt also currently receiving echo at this time and is unavailable for therapy. Will re-attempt another time.   Pansey Pinheiro 05/29/2018, 9:31 AM  Greggory Stallion, PT, DPT [phone]

## 2018-05-29 NOTE — Progress Notes (Signed)
Inpatient Diabetes Program Recommendations  AACE/ADA: New Consensus Statement on Inpatient Glycemic Control (2015)  Target Ranges:  Prepandial:   less than 140 mg/dL      Peak postprandial:   less than 180 mg/dL (1-2 hours)      Critically ill patients:  140 - 180 mg/dL   Lab Results  Component Value Date   GLUCAP 403 (H) 05/29/2018   HGBA1C 5.9 (H) 11/12/2017    Review of Glycemic ControlResults for MONZERRAT, WELLEN (MRN [id number]) as of 05/29/2018 08:54  Ref. Range 05/28/2018 21:33 05/29/2018 00:19 05/29/2018 02:35 05/29/2018 07:44  Glucose-Capillary Latest Ref Range: 70 - 99 mg/dL 55 (L) 174 (H) 239 (H) 403 (H)    Diabetes history: Type 1 DM  Outpatient Diabetes medications: Insulin pump: Omnipod Omnipod Basal: MN 0.25; 5a 0.3; noon 0.4; 4p 0.3; 7p 0.2; I/C: mn 1:25; 7AM 1:10; 10am 1:20 430pm 1:23 at supper Change to 1:15 at 10a-MN Insulin On Board: 4 ISF: 80 BG target 120 TDD: 40 units TDD Basal: 6.8  Current orders for Inpatient glycemic control: Novolog sensitive tid with meals and HS Patient to restart insulin pump once husband returns  Inpatient Diabetes Program Recommendations:    Note that insulin pump removed at 3:44 am for MRI.  Per RN, Husband to return soon to restart insulin pump.  Patient is very sensitive to insulin per previous insulin pump settings- 1 unit drops her 80 mg/dL and she covers 1 unit for every 20 grams of CHO.  Visited patient and she is currently eating.  Based on sensitivity, she likely needs approximately 3.5 units of insulin for elevated blood sugar and 2.5 unit for carbohydrates (50 grams).  Sent message to MD and order updated to 6 units of Novolog now.  Discussed with RN and she will give insulin now.  Once husband arrives, she will need to restart insulin pump ASAP.  At that time Novolog correction orders can be removed and patient can cover CHO and elevations with insulin pump.    Thanks,  Adah Perl, RN, BC-ADM Inpatient Diabetes  Coordinator Pager [phone] (8a-5p)

## 2018-05-29 NOTE — Discharge Instructions (Signed)
Resume diet and activity as before ° ° °

## 2018-05-29 NOTE — Progress Notes (Signed)
Pt admitted for stroke everything negative. Still having vision issues but that is baseline for patient. Pt will F/U with outpatient ophthalmologist. Reviewed discharge instructions and medications with pt and husband. IV removed. Pt to discharge with husband. NIH score was 1 during hospital stay and at discharge. Pt on insulin pump here in the hospital and met with diabetes coordinator. Blood sugar levels are stabilizing.

## 2018-05-29 NOTE — Progress Notes (Signed)
Inpatient Diabetes Program Recommendations  AACE/ADA: New Consensus Statement on Inpatient Glycemic Control (2015)  Target Ranges:  Prepandial:   less than 140 mg/dL      Peak postprandial:   less than 180 mg/dL (1-2 hours)      Critically ill patients:  140 - 180 mg/dL   Lab Results  Component Value Date   GLUCAP 499 (H) 05/29/2018   HGBA1C 6.9 (H) 05/29/2018    Spoke with RN regarding CBG.  Omnipod insulin pump restarted by husband and he administered insulin bolus.  RN to recheck blood sugar in 2 hours.   Thanks,  Adah Perl, RN, BC-ADM Inpatient Diabetes Coordinator Pager [phone] (8a-5p)

## 2018-05-29 NOTE — Progress Notes (Signed)
SLP Cancellation Note  Patient Details Name: Erin Daniels MRN: [id number] DOB: [birth year]   Cancelled treatment:       Reason Eval/Treat Not Completed: SLP screened, no needs identified, will sign off(chart reviewed; consulted NSG then met w/ pt). Pt denied any difficulty swallowing and is currently on a regular diet; tolerates swallowing pills w/ water per NSG. Pt conversed at conversational level w/out gross deficits noted; pt denied any new speech-language deficits - she stated she had Speech Therapy after "my last stroke" but "finished it". Noted pt's speech to be adequate, intelligible but min slower intermittently. She was able to describe use of her insulin pump, her appointments w/ her Eye MDs, and she named items she had for lunch today.  No further skilled ST services indicated as pt appears at her baseline. Pt agreed. NSG to reconsult if any change in status while admitted.     Orinda Kenner, [place], CCC-SLP [place] 05/29/2018, 2:02 PM

## 2018-05-29 NOTE — Progress Notes (Signed)
Pt was able to pass the stroke swallow screen test. Provided pt with [place] sandwich tray.

## 2018-05-29 NOTE — Progress Notes (Signed)
Hypoglycemic Event  CBG: 55  Treatment: sandwich tray provided and 4 oz orange juice provided  Symptoms: asymptomatic   Follow-up CBG: Time:0019 CBG Result:174  Possible Reasons for Event: kept  NPO   Comments/MD notified: Erin Daniels    Erin Daniels Erin Daniels

## 2018-05-29 NOTE — Progress Notes (Signed)
*  PRELIMINARY RESULTS* Echocardiogram 2D Echocardiogram has been performed.  Sherrie Sport 05/29/2018, 10:06 AM

## 2018-05-31 NOTE — Discharge Summary (Signed)
[place] [facility] [place] NAME: Erin Daniels    MR#:  [id number]  DATE OF BIRTH:  August 19, 1946  DATE OF ADMISSION:  05/28/2018 ADMITTING PHYSICIAN: Gladstone Lighter, MD  DATE OF DISCHARGE: 05/29/2018  5:49 PM  PRIMARY CARE PHYSICIAN: Juluis Pitch, MD   ADMISSION DIAGNOSIS:  Hypoglycemia [E16.2] Visual field cut [H53.40]  DISCHARGE DIAGNOSIS:  Active Problems:   Stroke [facility])   SECONDARY DIAGNOSIS:   Past Medical History:  Diagnosis Date  . At high risk for falls   . Basal cell carcinoma    right upper leg  . Complication of anesthesia    difficult to arouse  . Diabetes mellitus without complication (HCC)    Type I  . Diabetic neuropathy ([place])   . Diabetic retinopathy ([place])   . Dizziness   . Gastroparesis   . Herniated disc, cervical   . Hypercholesteremia   . Hypertension   . Hypotensive episode   . Hypothyroidism   . Insulin pump in place   . Myofascial muscle pain   . Numbness of arm    left  . Osteoporosis   . Poor vision   . Seizures ([place])   . Slurred speech   . Stroke (cerebrum) ([place])    4/19  . Stroke (HCC)    x5  . Vertigo   . Wears glasses      ADMITTING HISTORY  HISTORY OF PRESENT ILLNESS:  Erin Daniels  is a 72 y.o. female with a known history of type I diabetes mellitus on insulin pump, recurrent strokes on Coumadin, hypertension, hypothyroidism, diabetic retinopathy and neuropathy, gastroparesis presents to hospital secondary to visual loss. Patient is somewhat confused this evening.  According to husband, she woke up and was not herself this morning.  She poured water in her cereal which she is not normal for her.  Then she started describing blurred vision especially in her right eye and inability to see on the right side.  They made an urgent appointment with ophthalmology.  When they got evaluated by ophthalmology, they were advised to come to the emergency room for possible evaluation of stroke  as there was no other evidence of peripheral causes of visual loss.  Patient has had previous strokes, the last one being in March 2019 when she was admitted here.  She has been placed on Coumadin due to her repeat strokes even though there is no evidence of any cardiac arrhythmia or A. fib noted.  She was also evaluated recently by [facility] hematology to see if there is any clotting disorders and according to the family, none of them were diagnosed and the hematologist recommended that she will does not need Coumadin for that. Her CT of the head showing old strokes.  MRI is pending.  Her INR is subtherapeutic at 1.5.  Other than Coumadin she is also on aspirin.  Patient denies any slurred speech, no tingling or numbness and no other symptoms.  Husband states she is still confused but much improved than this morning.  HOSPITAL COURSE:   *Right vision loss Patient was admitted due to concern for acute CVA.  Initial CT head was negative.  Patient was admitted with stroke orders admit neurochecks.  Seen by physical therapy occupational therapy.  Continued on her aspirin, Coumadin and statin.  Neurology saw the patient.  MRI of the brain showed nothing acute.  Her symptoms were thought to be peripheral and discharged home to follow-up with her ophthalmologist.  Symptoms have  improved but still has some visual loss. Echocardiogram showed nothing acute.  Patient stable for discharge back home.  Follow-up with PCP and ophthalmology.  CONSULTS OBTAINED:  Treatment Team:  Alexis Goodell, MD  DRUG ALLERGIES:   Allergies  Allergen Reactions  . Codeine Nausea Only    Can take Vicodin and Percocet   . Meperidine Nausea Only  . Metoclopramide Hcl Nausea Only  . Oxycodone Nausea Only  . Stadol  [Butorphanol] Nausea Only  . Tussionex Pennkinetic Er [Hydrocod Polst-Cpm Polst Er] Other (See Comments)    Filled patient with sugar   . Carbocaine  [Mepivacaine Hcl] Palpitations    DISCHARGE MEDICATIONS:    Allergies as of 05/29/2018      Reactions   Codeine Nausea Only   Can take Vicodin and Percocet    Meperidine Nausea Only   Metoclopramide Hcl Nausea Only   Oxycodone Nausea Only   Stadol  [butorphanol] Nausea Only   Tussionex Pennkinetic Er [hydrocod Polst-cpm Polst Er] Other (See Comments)   Filled patient with sugar    Carbocaine  [mepivacaine Hcl] Palpitations      Medication List    TAKE these medications   aspirin EC 81 MG tablet Take 1 tablet (81 mg total) by mouth daily. What changed:    medication strength  how much to take  when to take this   atorvastatin 40 MG tablet Commonly known as:  LIPITOR Take 1 tablet (40 mg total) by mouth daily at 6 PM. What changed:  when to take this   butalbital-acetaminophen-caffeine 50-325-40 MG tablet Commonly known as:  FIORICET, ESGIC Take 0.5-1 tablets by mouth 2 (two) times daily as needed for headache.   CALCIUM 600 + D 600-200 MG-UNIT Tabs Generic drug:  Calcium Carb-Cholecalciferol Take 1 tablet by mouth every evening.   carboxymethylcellulose 0.5 % Soln Commonly known as:  REFRESH PLUS Place 1 drop into both eyes 3 (three) times daily as needed (for dry eyes).   cetirizine 10 MG tablet Commonly known as:  ZYRTEC Take 10 mg by mouth daily.   ferrous sulfate 325 (65 FE) MG tablet Take 325 mg by mouth every evening.   gabapentin 300 MG capsule Commonly known as:  NEURONTIN 1 in the am, 1 at lunch and 3 at bedtime What changed:    how much to take  how to take this  when to take this  additional instructions   glucose 4 GM chewable tablet Chew 1 tablet by mouth as needed for low blood sugar.   insulin pump Soln Inject 0.4-4 each into the skin continuous. Uses Novolog. (Pt states she takes 20 units cont through out day.)   losartan 50 MG tablet Commonly known as:  COZAAR Take 50 mg by mouth every evening.   multivitamin with minerals Tabs tablet Take 1 tablet by mouth every evening.    polyethylene glycol packet Commonly known as:  MIRALAX / GLYCOLAX Take 17 g by mouth every other day.   promethazine 25 MG suppository Commonly known as:  PHENERGAN Place 25 mg rectally every 6 (six) hours as needed for nausea or vomiting.   SYSTANE 0.4-0.3 % Soln Generic drug:  Polyethyl Glycol-Propyl Glycol Apply 1 drop to eye as needed (for dry eyes).   venlafaxine 75 MG tablet Commonly known as:  EFFEXOR Take 75 mg by mouth 2 (two) times daily.   vitamin C 500 MG tablet Commonly known as:  ASCORBIC ACID Take 500 mg by mouth 2 (two) times daily.   Vitamin  D-3 5000 units Tabs Take 5,000 Units by mouth daily.   warfarin 5 MG tablet Commonly known as:  COUMADIN Take 7.5 mg by mouth every evening.       Today   VITAL SIGNS:  Blood pressure (!) 134/52, pulse 70, temperature 97.9 F (36.6 C), temperature source Oral, resp. rate 18, height 5\' 2"  (1.575 m), weight 45.8 kg, SpO2 98 %.  I/O:  No intake or output data in the 24 hours ending 05/31/18 1355  PHYSICAL EXAMINATION:  Physical Exam  GENERAL:  72 y.o.-year-old patient lying in the bed with no acute distress.  LUNGS: Normal breath sounds bilaterally, no wheezing, rales,rhonchi or crepitation. No use of accessory muscles of respiration.  CARDIOVASCULAR: S1, S2 normal. No murmurs, rubs, or gallops.  ABDOMEN: Soft, non-tender, non-distended. Bowel sounds present. No organomegaly or mass.  NEUROLOGIC: Moves all 4 extremities. PSYCHIATRIC: The patient is alert and oriented x 3.  SKIN: No obvious rash, lesion, or ulcer.   DATA REVIEW:   CBC Recent Labs  Lab 05/29/18 0556  WBC 5.8  HGB 11.0*  HCT 32.9*  PLT 201    Chemistries  Recent Labs  Lab 05/28/18 1731 05/29/18 0556  NA 142 137  K 4.5 3.9  CL 103 99  CO2 32 28  GLUCOSE 63* 372*  BUN 24* 19  CREATININE 0.57 0.50  CALCIUM 10.1 9.2  AST 28  --   ALT 20  --   ALKPHOS 65  --   BILITOT 0.4  --     Cardiac Enzymes Recent Labs  Lab  05/28/18 1731  TROPONINI <0.03    Microbiology Results  Results for orders placed or performed during the hospital encounter of 11/26/16  Blood Culture (routine x 2)     Status: None   Collection Time: 11/26/16  8:11 PM  Result Value Ref Range Status   Specimen Description BLOOD LEFT ANTECUBITAL  Final   Special Requests IN PEDIATRIC BOTTLE Blood Culture adequate volume  Final   Culture NO GROWTH 5 DAYS  Final   Report Status 12/01/2016 FINAL  Final  Blood Culture (routine x 2)     Status: None   Collection Time: 11/26/16  8:39 PM  Result Value Ref Range Status   Specimen Description BLOOD RIGHT ARM  Final   Special Requests   Final    AEROBIC BOTTLE ONLY Blood Culture results may not be optimal due to an inadequate volume of blood received in culture bottles   Culture NO GROWTH 5 DAYS  Final   Report Status 12/01/2016 FINAL  Final  MRSA PCR Screening     Status: None   Collection Time: 11/27/16  5:07 PM  Result Value Ref Range Status   MRSA by PCR NEGATIVE NEGATIVE Final    Comment:        The GeneXpert MRSA Assay (FDA approved for NASAL specimens only), is one component of a comprehensive MRSA colonization surveillance program. It is not intended to diagnose MRSA infection nor to guide or monitor treatment for MRSA infections.     RADIOLOGY:  No results found.  Follow up with PCP in 1 week.  Management plans discussed with the patient, family and they are in agreement.  CODE STATUS:  Code Status History    Date Active Date Inactive Code Status Order ID Comments User Context   11/11/2017 0648 11/13/2017 1700 Full Code [id number]  Harrie Foreman, MD Inpatient   11/26/2016 2325 11/30/2016 1850 Full Code [id number]  Sid Falcon,  MD ED   05/29/2015 1352 05/31/2015 1522 Full Code [id number]  Roseanne Kaufman, MD Inpatient    Advance Directive Documentation     Most Recent Value  Type of Advance Directive  Living will, Healthcare Power of Attorney  Pre-existing out of  facility DNR order (yellow form or pink MOST form)  -  "MOST" Form in Place?  -      TOTAL TIME TAKING CARE OF THIS PATIENT ON DAY OF DISCHARGE: more than 30 minutes.   Leia Alf Aveen Stansel M.D on 05/31/2018 at 1:55 PM  Between 7am to 6pm - Pager - [phone]  After 6pm go to [email or web address] - password [email or web address] [facility]  Office  [phone]  CC: Primary care physician; Juluis Pitch, MD  Note: This dictation was prepared with Dragon dictation along with smaller phrase technology. Any transcriptional errors that result from this process are unintentional.

## 2018-06-18 ENCOUNTER — Telehealth: Payer: Self-pay | Admitting: Student in an Organized Health Care Education/Training Program

## 2018-06-18 MED ORDER — BUTALBITAL-APAP-CAFFEINE 50-325-40 MG PO TABS: 0.5000 | ORAL_TABLET | Freq: Two times a day (BID) | ORAL | 5 refills | Status: DC | PRN

## 2018-06-18 NOTE — Telephone Encounter (Signed)
Patient notified that script for Fioricet sent to [facility].

## 2018-06-18 NOTE — Telephone Encounter (Signed)
Fiorcet sent in  Requested Prescriptions   Signed Prescriptions Disp Refills  . butalbital-acetaminophen-caffeine (FIORICET, ESGIC) 50-325-40 MG tablet 60 tablet 5    Sig: Take 0.5-1 tablets by mouth 2 (two) times daily as needed for headache.    Authorizing Provider: Gillis Santa

## 2018-06-18 NOTE — Telephone Encounter (Signed)
Patient would like scripts called in to [facility] for fiorcet tablets. [phone] [facility] only has [facility] left .

## 2018-06-19 ENCOUNTER — Other Ambulatory Visit: Payer: Self-pay | Admitting: Student in an Organized Health Care Education/Training Program

## 2018-06-19 MED ORDER — BUTALBITAL-APAP-CAFFEINE 50-325-40 MG PO TABS: 0.5000 | ORAL_TABLET | Freq: Two times a day (BID) | ORAL | 5 refills | Status: DC | PRN

## 2018-08-15 ENCOUNTER — Other Ambulatory Visit: Payer: Self-pay | Admitting: Obstetrics & Gynecology

## 2018-08-15 ENCOUNTER — Other Ambulatory Visit: Payer: Self-pay | Admitting: Family Medicine

## 2018-08-15 DIAGNOSIS — Z1231 Encounter for screening mammogram for malignant neoplasm of breast: Secondary | ICD-10-CM

## 2018-09-15 ENCOUNTER — Inpatient Hospital Stay
Admission: EM | Admit: 2018-09-15 | Discharge: 2018-09-18 | DRG: 544 | Disposition: A | Discharge status: Skilled Nursing Facility | Payer: Medicare Other | Attending: Internal Medicine | Admitting: Internal Medicine

## 2018-09-15 ENCOUNTER — Emergency Department: Payer: Medicare Other

## 2018-09-15 ENCOUNTER — Other Ambulatory Visit: Payer: Self-pay

## 2018-09-15 DIAGNOSIS — Z85828 Personal history of other malignant neoplasm of skin: Secondary | ICD-10-CM

## 2018-09-15 DIAGNOSIS — M8008XA Age-related osteoporosis with current pathological fracture, vertebra(e), initial encounter for fracture: Secondary | ICD-10-CM | POA: Diagnosis not present

## 2018-09-15 DIAGNOSIS — I10 Essential (primary) hypertension: Secondary | ICD-10-CM | POA: Diagnosis present

## 2018-09-15 DIAGNOSIS — Z794 Long term (current) use of insulin: Secondary | ICD-10-CM

## 2018-09-15 DIAGNOSIS — E785 Hyperlipidemia, unspecified: Secondary | ICD-10-CM | POA: Diagnosis present

## 2018-09-15 DIAGNOSIS — F32A Depression, unspecified: Secondary | ICD-10-CM | POA: Diagnosis present

## 2018-09-15 DIAGNOSIS — F329 Major depressive disorder, single episode, unspecified: Secondary | ICD-10-CM | POA: Diagnosis present

## 2018-09-15 DIAGNOSIS — Z79899 Other long term (current) drug therapy: Secondary | ICD-10-CM

## 2018-09-15 DIAGNOSIS — E1042 Type 1 diabetes mellitus with diabetic polyneuropathy: Secondary | ICD-10-CM | POA: Diagnosis present

## 2018-09-15 DIAGNOSIS — E10319 Type 1 diabetes mellitus with unspecified diabetic retinopathy without macular edema: Secondary | ICD-10-CM | POA: Diagnosis present

## 2018-09-15 DIAGNOSIS — M47816 Spondylosis without myelopathy or radiculopathy, lumbar region: Secondary | ICD-10-CM | POA: Diagnosis present

## 2018-09-15 DIAGNOSIS — Z9641 Presence of insulin pump (external) (internal): Secondary | ICD-10-CM | POA: Diagnosis present

## 2018-09-15 DIAGNOSIS — Z823 Family history of stroke: Secondary | ICD-10-CM | POA: Diagnosis not present

## 2018-09-15 DIAGNOSIS — H547 Unspecified visual loss: Secondary | ICD-10-CM | POA: Diagnosis present

## 2018-09-15 DIAGNOSIS — Z96612 Presence of left artificial shoulder joint: Secondary | ICD-10-CM | POA: Diagnosis present

## 2018-09-15 DIAGNOSIS — K3184 Gastroparesis: Secondary | ICD-10-CM | POA: Diagnosis present

## 2018-09-15 DIAGNOSIS — E039 Hypothyroidism, unspecified: Secondary | ICD-10-CM | POA: Diagnosis present

## 2018-09-15 DIAGNOSIS — E7800 Pure hypercholesterolemia, unspecified: Secondary | ICD-10-CM | POA: Diagnosis present

## 2018-09-15 DIAGNOSIS — R339 Retention of urine, unspecified: Secondary | ICD-10-CM | POA: Diagnosis present

## 2018-09-15 DIAGNOSIS — Z7982 Long term (current) use of aspirin: Secondary | ICD-10-CM

## 2018-09-15 DIAGNOSIS — S3210XB Unspecified fracture of sacrum, initial encounter for open fracture: Secondary | ICD-10-CM

## 2018-09-15 DIAGNOSIS — R269 Unspecified abnormalities of gait and mobility: Secondary | ICD-10-CM | POA: Diagnosis present

## 2018-09-15 DIAGNOSIS — E1043 Type 1 diabetes mellitus with diabetic autonomic (poly)neuropathy: Secondary | ICD-10-CM | POA: Diagnosis present

## 2018-09-15 DIAGNOSIS — Z7901 Long term (current) use of anticoagulants: Secondary | ICD-10-CM

## 2018-09-15 DIAGNOSIS — G894 Chronic pain syndrome: Secondary | ICD-10-CM | POA: Diagnosis present

## 2018-09-15 DIAGNOSIS — Z885 Allergy status to narcotic agent status: Secondary | ICD-10-CM | POA: Diagnosis not present

## 2018-09-15 DIAGNOSIS — Z8673 Personal history of transient ischemic attack (TIA), and cerebral infarction without residual deficits: Secondary | ICD-10-CM | POA: Diagnosis not present

## 2018-09-15 DIAGNOSIS — R52 Pain, unspecified: Secondary | ICD-10-CM | POA: Diagnosis present

## 2018-09-15 DIAGNOSIS — R296 Repeated falls: Secondary | ICD-10-CM | POA: Diagnosis present

## 2018-09-15 DIAGNOSIS — Z8249 Family history of ischemic heart disease and other diseases of the circulatory system: Secondary | ICD-10-CM

## 2018-09-15 DIAGNOSIS — K5900 Constipation, unspecified: Secondary | ICD-10-CM | POA: Diagnosis present

## 2018-09-15 DIAGNOSIS — Z888 Allergy status to other drugs, medicaments and biological substances status: Secondary | ICD-10-CM

## 2018-09-15 DIAGNOSIS — I6930 Unspecified sequelae of cerebral infarction: Secondary | ICD-10-CM | POA: Diagnosis present

## 2018-09-15 DIAGNOSIS — K5909 Other constipation: Secondary | ICD-10-CM | POA: Diagnosis present

## 2018-09-15 DIAGNOSIS — R338 Other retention of urine: Secondary | ICD-10-CM | POA: Diagnosis present

## 2018-09-15 LAB — CBC WITH DIFFERENTIAL/PLATELET
Abs Immature Granulocytes: 0.04 10*3/uL (ref 0.00–0.07)
Basophils Absolute: 0 10*3/uL (ref 0.0–0.1)
Basophils Relative: 0 %
EOS PCT: 0 %
Eosinophils Absolute: 0 10*3/uL (ref 0.0–0.5)
HEMATOCRIT: 36.2 % (ref 36.0–46.0)
HEMOGLOBIN: 12 g/dL (ref 12.0–15.0)
Immature Granulocytes: 0 %
LYMPHS ABS: 0.8 10*3/uL (ref 0.7–4.0)
Lymphocytes Relative: 8 %
MCH: 30.9 pg (ref 26.0–34.0)
MCHC: 33.1 g/dL (ref 30.0–36.0)
MCV: 93.3 fL (ref 80.0–100.0)
MONO ABS: 0.8 10*3/uL (ref 0.1–1.0)
MONOS PCT: 8 %
Neutro Abs: 8.1 10*3/uL — ABNORMAL HIGH (ref 1.7–7.7)
Neutrophils Relative %: 84 %
Platelets: 299 10*3/uL (ref 150–400)
RBC: 3.88 MIL/uL (ref 3.87–5.11)
RDW: 11.9 % (ref 11.5–15.5)
WBC: 9.7 10*3/uL (ref 4.0–10.5)
nRBC: 0 % (ref 0.0–0.2)

## 2018-09-15 LAB — PROTIME-INR
INR: 2.79
Prothrombin Time: 29 seconds — ABNORMAL HIGH (ref 11.4–15.2)

## 2018-09-15 LAB — URINALYSIS, COMPLETE (UACMP) WITH MICROSCOPIC
BACTERIA UA: NONE SEEN
BILIRUBIN URINE: NEGATIVE
Glucose, UA: >=500 mg/dL — AB
Hgb urine dipstick: NEGATIVE
Ketones, ur: 20 mg/dL — AB
Leukocytes, UA: NEGATIVE
NITRITE: NEGATIVE
Protein, ur: NEGATIVE mg/dL
SPECIFIC GRAVITY, URINE: 1.015 (ref 1.005–1.030)
Squamous Epithelial / LPF: NONE SEEN (ref 0–5)
pH: 7 (ref 5.0–8.0)

## 2018-09-15 LAB — COMPREHENSIVE METABOLIC PANEL
ALK PHOS: 176 U/L — AB (ref 38–126)
ALT: 20 U/L (ref 0–44)
AST: 28 U/L (ref 15–41)
Albumin: 3.7 g/dL (ref 3.5–5.0)
Anion gap: 8 (ref 5–15)
BUN: 18 mg/dL (ref 8–23)
CHLORIDE: 100 mmol/L (ref 98–111)
CO2: 29 mmol/L (ref 22–32)
CREATININE: 0.55 mg/dL (ref 0.44–1.00)
Calcium: 8.9 mg/dL (ref 8.9–10.3)
GFR calc Af Amer: >60 mL/min (ref >60)
GFR calc non Af Amer: >60 mL/min (ref >60)
Glucose, Bld: 352 mg/dL — ABNORMAL HIGH (ref 70–99)
Potassium: 4 mmol/L (ref 3.5–5.1)
SODIUM: 137 mmol/L (ref 135–145)
Total Bilirubin: 0.6 mg/dL (ref 0.3–1.2)
Total Protein: 6.7 g/dL (ref 6.5–8.1)

## 2018-09-15 LAB — GLUCOSE, CAPILLARY: GLUCOSE-CAPILLARY: 322 mg/dL — AB (ref 70–99)

## 2018-09-15 MED ORDER — INSULIN ASPART 100 UNIT/ML ~~LOC~~ SOLN
0.0000 [IU] | Freq: Every day | SUBCUTANEOUS | Status: DC
  Administered 2018-09-16: 4 [IU] via SUBCUTANEOUS
  Filled 2018-09-15: qty 1

## 2018-09-15 MED ORDER — MORPHINE SULFATE (PF) 2 MG/ML IV SOLN
2.0000 mg | Freq: Once | INTRAVENOUS | Status: AC
  Administered 2018-09-15: 2 mg via INTRAVENOUS
  Filled 2018-09-15: qty 1

## 2018-09-15 MED ORDER — VENLAFAXINE HCL 37.5 MG PO TABS
75.0000 mg | ORAL_TABLET | Freq: Two times a day (BID) | ORAL | Status: DC
  Administered 2018-09-16 – 2018-09-18 (×6): 75 mg via ORAL
  Filled 2018-09-15 (×7): qty 2

## 2018-09-15 MED ORDER — WARFARIN SODIUM 7.5 MG PO TABS
7.5000 mg | ORAL_TABLET | Freq: Every evening | ORAL | Status: DC
  Administered 2018-09-16 – 2018-09-17 (×2): 7.5 mg via ORAL
  Filled 2018-09-15 (×3): qty 1

## 2018-09-15 MED ORDER — ASPIRIN EC 81 MG PO TBEC
81.0000 mg | DELAYED_RELEASE_TABLET | Freq: Every day | ORAL | Status: DC
  Administered 2018-09-16 – 2018-09-18 (×3): 81 mg via ORAL
  Filled 2018-09-15 (×2): qty 1

## 2018-09-15 MED ORDER — HYDROMORPHONE HCL 1 MG/ML IJ SOLN
0.5000 mg | Freq: Once | INTRAMUSCULAR | Status: AC
  Administered 2018-09-15: 0.5 mg via INTRAVENOUS

## 2018-09-15 MED ORDER — INSULIN PUMP
0.4000 | SUBCUTANEOUS | Status: DC
  Filled 2018-09-15: qty 1

## 2018-09-15 MED ORDER — MAGNESIUM CITRATE PO SOLN
0.5000 | Freq: Once | ORAL | Status: AC
  Administered 2018-09-16: 0.5 via ORAL
  Filled 2018-09-15: qty 296

## 2018-09-15 MED ORDER — ONDANSETRON HCL 4 MG/2ML IJ SOLN
4.0000 mg | Freq: Once | INTRAMUSCULAR | Status: DC
  Filled 2018-09-15: qty 2

## 2018-09-15 MED ORDER — TIZANIDINE HCL 2 MG PO TABS
2.0000 mg | ORAL_TABLET | Freq: Every evening | ORAL | Status: DC
  Administered 2018-09-16 – 2018-09-17 (×2): 2 mg via ORAL
  Filled 2018-09-15 (×2): qty 1

## 2018-09-15 MED ORDER — HYDROMORPHONE HCL 1 MG/ML IJ SOLN
0.5000 mg | INTRAMUSCULAR | Status: DC | PRN
  Administered 2018-09-15 – 2018-09-16 (×2): 0.5 mg via INTRAVENOUS
  Filled 2018-09-15 (×3): qty 1

## 2018-09-15 MED ORDER — GABAPENTIN 300 MG PO CAPS
300.0000 mg | ORAL_CAPSULE | Freq: Two times a day (BID) | ORAL | Status: DC
  Administered 2018-09-16 – 2018-09-18 (×5): 300 mg via ORAL
  Filled 2018-09-15 (×5): qty 1

## 2018-09-15 MED ORDER — LORAZEPAM 2 MG/ML IJ SOLN
0.5000 mg | Freq: Once | INTRAMUSCULAR | Status: AC
  Administered 2018-09-15: 0.5 mg via INTRAVENOUS
  Filled 2018-09-15: qty 1

## 2018-09-15 MED ORDER — INSULIN ASPART 100 UNIT/ML ~~LOC~~ SOLN
0.0000 [IU] | Freq: Three times a day (TID) | SUBCUTANEOUS | Status: DC
  Administered 2018-09-16: 8 [IU] via SUBCUTANEOUS
  Administered 2018-09-16: 11 [IU] via SUBCUTANEOUS
  Administered 2018-09-17: 2 [IU] via SUBCUTANEOUS
  Administered 2018-09-17 (×2): 3 [IU] via SUBCUTANEOUS
  Administered 2018-09-18: 11 [IU] via SUBCUTANEOUS
  Administered 2018-09-18: 8 [IU] via SUBCUTANEOUS
  Filled 2018-09-15 (×7): qty 1

## 2018-09-15 MED ORDER — IOPAMIDOL (ISOVUE-300) INJECTION 61%
100.0000 mL | Freq: Once | INTRAVENOUS | Status: AC | PRN
  Administered 2018-09-15: 100 mL via INTRAVENOUS

## 2018-09-15 MED ORDER — LOSARTAN POTASSIUM 50 MG PO TABS
50.0000 mg | ORAL_TABLET | Freq: Every evening | ORAL | Status: DC
  Administered 2018-09-16 – 2018-09-17 (×2): 50 mg via ORAL
  Filled 2018-09-15: qty 1

## 2018-09-15 MED ORDER — ONDANSETRON HCL 4 MG/2ML IJ SOLN
4.0000 mg | Freq: Once | INTRAMUSCULAR | Status: AC
  Administered 2018-09-15: 4 mg via INTRAVENOUS

## 2018-09-15 MED ORDER — GABAPENTIN 300 MG PO CAPS: 300.0000 mg | ORAL_CAPSULE | ORAL | Status: DC

## 2018-09-15 MED ORDER — GABAPENTIN 300 MG PO CAPS
900.0000 mg | ORAL_CAPSULE | Freq: Every day | ORAL | Status: DC
  Administered 2018-09-16 – 2018-09-17 (×2): 900 mg via ORAL
  Filled 2018-09-15 (×2): qty 3

## 2018-09-15 MED ORDER — ATORVASTATIN CALCIUM 20 MG PO TABS
40.0000 mg | ORAL_TABLET | Freq: Every day | ORAL | Status: DC
  Administered 2018-09-16 – 2018-09-17 (×2): 40 mg via ORAL
  Filled 2018-09-15 (×2): qty 2

## 2018-09-15 NOTE — ED Provider Notes (Signed)
[facility] Emergency Department Provider Note   ____________________________________________   First MD Initiated Contact with Patient 09/15/18 1850     (approximate)  I have reviewed the triage vital signs and the nursing notes.   HISTORY  Chief Complaint Back Pain    HPI MARTASIA TALAMANTE is a 73 y.o. adult who comes in complaining of severe pain in the area of her coccyx.  Her husband reports she frequently gets down to look under the cabinets and falls and lands on her bottom.  Pains been there since the 17th of this month is constantly getting worse.  She is seen the doctor and gotten some pain medicine has not helped any.  There is some tenderness on palpation of the sacrum.  Patient is laying on her side to avoid lying on the sacrum she is moaning in pain.  Past Medical History:  Diagnosis Date  . At high risk for falls   . Basal cell carcinoma    right upper leg  . Complication of anesthesia    difficult to arouse  . Diabetes mellitus without complication (HCC)    Type I  . Diabetic neuropathy ([place])   . Diabetic retinopathy ([place])   . Dizziness   . Gastroparesis   . Herniated disc, cervical   . Hypercholesteremia   . Hypertension   . Hypotensive episode   . Hypothyroidism   . Insulin pump in place   . Myofascial muscle pain   . Numbness of arm    left  . Osteoporosis   . Poor vision   . Seizures ([place])   . Slurred speech   . 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 Stroke (HCC)    x5  . Vertigo   . Wears glasses     Patient Active Problem List   Diagnosis Date Noted  . Stroke ([place]) 05/28/2018  . Diabetic polyneuropathy associated with type 1 diabetes mellitus ([place]) 12/10/2017  . Numbness and tingling of both legs 12/10/2017  . Chronic pain syndrome 12/10/2017  . DKA (diabetic ketoacidoses) ([place]) 11/12/2017  . Confusion 11/11/2017  . Hypoglycemia 11/26/2016  . Hypokalemia   . Hypothermia   . Lactic acid acidosis   . Sepsis  ([place])   . Type 1 diabetes mellitus with complication ([place])   . Hyponatremia   . Essential hypertension   . Hyperlipidemia   . Depression   . Right radial fracture 05/29/2015    Past Surgical History:  Procedure Laterality Date  . APPENDECTOMY    . AUGMENTATION MAMMAPLASTY Bilateral 2011  . BILATERAL CARPAL TUNNEL RELEASE    . COLONOSCOPY    . EYE SURGERY    . HEMORROIDECTOMY    . HERNIA REPAIR    . JOINT REPLACEMENT    . OPEN REDUCTION INTERNAL FIXATION (ORIF) DISTAL RADIAL FRACTURE Right 05/29/2015   Procedure: OPEN REDUCTION INTERNAL FIXATION (ORIF) RIGHT DISTAL RADIUS FRACTURE WITH ALLOGRAFT BONE GRAFTING FOR REPAIR AND RECONSTRUCTION;  Surgeon: Roseanne Kaufman, MD;  Location: [place];  Service: Orthopedics;  Laterality: Right;  . SHOULDER ARTHROSCOPY Left   . SHOULDER HEMI-ARTHROPLASTY Left   . SYNOVECTOMY    . TONSILLECTOMY    . TRIGGER FINGER RELEASE    . TUBAL LIGATION      Prior to Admission medications   Medication Sig Start Date End Date Taking? Authorizing Provider  tiZANidine (ZANAFLEX) 2 MG tablet Take 2 mg by mouth Nightly. 09/06/18  Yes [provider]  aspirin EC 81 MG  tablet Take 1 tablet (81 mg total) by mouth daily. 05/29/18   Hillary Bow, MD  atorvastatin (LIPITOR) 40 MG tablet Take 1 tablet (40 mg total) by mouth daily at 6 PM. Patient taking differently: Take 40 mg by mouth every evening.  11/13/17   Demetrios Loll, MD  butalbital-acetaminophen-caffeine (FIORICET, ESGIC) [phone] MG tablet Take 0.5-1 tablets by mouth 2 (two) times daily as needed for headache. 06/19/18   Gillis Santa, MD  Calcium Carb-Cholecalciferol (CALCIUM 600 + D) 600-200 MG-UNIT TABS Take 1 tablet by mouth every evening.    [provider]  carboxymethylcellulose (REFRESH PLUS) 0.5 % SOLN Place 1 drop into both eyes 3 (three) times daily as needed (for dry eyes).     [provider]  cetirizine (ZYRTEC) 10 MG tablet Take 10 mg by mouth daily.    [provider]  Cholecalciferol (VITAMIN D-3) 5000 units TABS Take 5,000 Units by mouth daily.    [provider]  ferrous sulfate 325 (65 FE) MG tablet Take 325 mg by mouth every evening.    [provider]  gabapentin (NEURONTIN) 300 MG capsule 1 in the am, 1 at lunch and 3 at bedtime Patient taking differently: Take 300-900 mg by mouth See admin instructions. 300 mg every morning, 300 mg every lunchtime, and 900 mg at bedtime 04/11/18   Gillis Santa, MD  glucose 4 GM chewable tablet Chew 1 tablet by mouth as needed for low blood sugar.    [provider]  Insulin Human (INSULIN PUMP) SOLN Inject 0.4-4 each into the skin continuous. Uses Novolog. (Pt states she takes 20 units cont through out day.)    [provider]  losartan (COZAAR) 50 MG tablet Take 50 mg by mouth every evening.     [provider]  Multiple Vitamin (MULTIVITAMIN WITH MINERALS) TABS tablet Take 1 tablet by mouth every evening.    [provider]  Polyethyl Glycol-Propyl Glycol (SYSTANE) 0.4-0.3 % SOLN Apply 1 drop to eye as needed (for dry eyes).     [provider]  polyethylene glycol (MIRALAX / GLYCOLAX) packet Take 17 g by mouth every other day.    [provider]  promethazine (PHENERGAN) 25 MG suppository Place 25 mg rectally every 6 (six) hours as needed for nausea or vomiting.    [provider]  venlafaxine (EFFEXOR) 75 MG tablet Take 75 mg by mouth 2 (two) times daily.    [provider]  vitamin C (ASCORBIC ACID) 500 MG tablet Take 500 mg by mouth 2 (two) times daily.    [provider]  warfarin (COUMADIN) 5 MG tablet Take 7.5 mg by mouth every evening.     [provider]    Allergies Codeine; Meperidine; Metoclopramide hcl; Oxycodone; Stadol  [butorphanol]; Tussionex pennkinetic er [employer] polst-cpm polst er]; and Carbocaine  [mepivacaine hcl]  Family History  Problem Relation Age of Onset  .  Hypertension Mother   . Stroke Mother   . Heart failure Father     Social History Social History   Tobacco Use  . Smoking status: Never Smoker  . Smokeless tobacco: Never Used  Substance Use Topics  . Alcohol use: No  . Drug use: No    Review of Systems  Constitutional: No fever/chills Eyes: No visual changes. ENT: No sore throat. Cardiovascular: Denies chest pain. Respiratory: Denies shortness of breath. Gastrointestinal: No abdominal pain.  No nausea, no vomiting.  No diarrhea.  No constipation. Genitourinary: Negative for dysuria. Musculoskeletal:  Negative for back pain.  Patient does have severe pain in the sacrum/coccyx area Skin: Negative for rash. Neurological: Negative for headaches, focal weakness or numbness.   ____________________________________________   PHYSICAL EXAM:  VITAL SIGNS: ED Triage Vitals  Enc Vitals Group     BP 09/15/18 1809 (!) 190/93     Pulse Rate 09/15/18 1809 85     Resp 09/15/18 1809 15     Temp 09/15/18 1809 97.7 F (36.5 C)     Temp Source 09/15/18 1809 Oral     SpO2 09/15/18 1809 92 %     Weight 09/15/18 1805 108 lb (49 kg)     Height 09/15/18 1805 5\' 2"  (1.575 m)     Head Circumference --      Peak Flow --      Pain Score 09/15/18 1805 10     Pain Loc --      Pain Edu? --      Excl. in [place]? --     Constitutional: Alert and oriented.  Laying on her side moaning in pain Eyes: Conjunctivae are normal.  Head: Atraumatic. Nose: No congestion/rhinnorhea. Mouth/Throat: Mucous membranes are moist.  Oropharynx non-erythematous. Neck: No stridor. Cardiovascular: Normal rate, regular rhythm. Grossly normal heart sounds.  Good peripheral circulation. Respiratory: Normal respiratory effort.  No retractions. Lungs CTAB. Gastrointestinal: Soft and nontender. No distention. No abdominal bruits. No CVA tenderness. Musculoskeletal: No lower extremity tenderness nor edema.  Sacrum is tender to palpation however I do not see any swelling  redness or anything else going on. Neurologic:  Normal speech and language. No gross focal neurologic deficits are appreciated. Skin:  Skin is warm, dry and intact. No rash noted.   ____________________________________________   LABS (all labs ordered are listed, but only abnormal results are displayed)  Labs Reviewed  GLUCOSE, CAPILLARY - Abnormal; Notable for the following components:      Result Value   Glucose-Capillary 322 (*)    All other components within normal limits  COMPREHENSIVE METABOLIC PANEL - Abnormal; Notable for the following components:   Glucose, Bld 352 (*)    Alkaline Phosphatase 176 (*)    All other components within normal limits  CBC WITH DIFFERENTIAL/PLATELET - Abnormal; Notable for the following components:   Neutro Abs 8.1 (*)    All other components within normal limits  URINALYSIS, COMPLETE (UACMP) WITH MICROSCOPIC - Abnormal; Notable for the following components:   Color, Urine STRAW (*)    APPearance CLEAR (*)    Glucose, UA >=500 (*)    Ketones, ur 20 (*)    All other components within normal limits   ____________________________________________  EKG   ____________________________________________  RADIOLOGY  ED MD interpretation: CT shows multiple sacral fractures and transverse process fracture of L5  Official radiology report(s): Ct Pelvis W Contrast  Result Date: 09/15/2018 CLINICAL DATA:  Severe pain in the coccyx area after a fall EXAM: CT PELVIS WITH CONTRAST TECHNIQUE: Multidetector CT imaging of the pelvis was performed using the standard protocol following the bolus administration of intravenous contrast. CONTRAST:  142mL ISOVUE-300 IOPAMIDOL (ISOVUE-300) INJECTION 61% COMPARISON:  CT 11/13/2017 FINDINGS: Urinary Tract:  No abnormality visualized. Bowel:  Copious stool in the colon.  No bowel wall thickening. Vascular/Lymphatic: No aneurism. Moderate aortic atherosclerosis. No pelvic adenopathy Reproductive:  No mass or other  significant abnormality Other:  Presacral edema and soft tissue stranding. Musculoskeletal: Acute nondisplaced right transverse process fracture at L5. Linear lucency involving the right sacral ala consistent with nondisplaced fracture.  Additional acute mildly displaced left sacral ala fracture. Linear sclerosis within the right sacral body at the S3 and S4 level suggesting subacute fracture. No SI joint widening. IMPRESSION: 1. Acute appearing bilateral sacral ala fractures. Linear sclerosis within the mid to distal sacral body on the right side, suspicious for subacute sacral fracture. 2. Acute nondisplaced right transverse process fracture Electronically Signed   By: Donavan Foil M.D.   On: 09/15/2018 20:48    ____________________________________________   PROCEDURES  Procedure(s) performed:   Procedures  Critical Care performed: Critical care time 30 minutes.  This includes discussing the patient with her husband attempting to evaluate her while she is half out of her mind from the pain and then speaking to the hospitalist evaluating the CT scan and talking to the orthopedic surgeon.  ____________________________________________   INITIAL IMPRESSION / ASSESSMENT AND PLAN / ED COURSE   Patient given pain meds her pain is now under control is only 5 out of 10 instead of 10 out of 10.  However she is now desaturating.  And requiring oxygen.  We will get her in the hospital for pain control and to manage her low O2 sats on pain medicines and to further evaluate her because she is retaining urine the Foley catheter drained 1.2 L from her.  She also has a lot of stool which will need to be remedied.  I discussed her case with the hospitalist and Dr. Donivan Scull.         ____________________________________________   FINAL CLINICAL IMPRESSION(S) / ED DIAGNOSES  Final diagnoses:  Open fracture of sacrum, unspecified portion of sacrum, initial encounter [facility])     ED Discharge Orders     None       Note:  This document was prepared using Dragon voice recognition software and may include unintentional dictation errors.    Nena Polio, MD 09/15/18 2106

## 2018-09-15 NOTE — ED Notes (Signed)
External urinary catheter placed.

## 2018-09-15 NOTE — ED Notes (Signed)
Pt's spouse states "have you checked her sugar". Spouse updated sugar is 352 on lab work and MD is aware. Pt's spouse states "you do know, or do I need to educate her that when she gets over 300 she starts spilling ketones in her pee." spouse updated that yes this RN is aware pt's blood sugar is elevated, and MD is aware, but will need an order for insulin, but MD has not provided one at this time due to emergency situation MD is currently in. Spouse states "well I guess an arrest is more important than anything else right now". Pt appears more comfortable. Clear yellow urine continues to drain in foley.

## 2018-09-15 NOTE — ED Triage Notes (Signed)
Pt comes from home via [facility] with back pain for x3 days. Is not sure about recent trauma. 72mcg fentanyl and 384ml NS with EMS. T1D with CBG of 427 with EMS. 168/80 with EMS. Pt on blood thinners. AOx4. Swelling about L5 with EMS.

## 2018-09-15 NOTE — H&P (Signed)
PCP:   Juluis Pitch, MD   Chief Complaint:    HPI: this is a 73y/o female who presents with c/o horrible pain in her sacrum. She went to see her doctor on Monday.  X-rays done revealed impinged nerve. She was given pain pills and muscle relaxer.  Her pain did not improve.  She follow-up with her PCP and she was given steroids. Nothing helped. Moving caused extreme pain. 911 was finally called and she was brought in.  Husband she has frequent low impact falls from crouching positions.  she has osteoporosis. She is on calcium pills on and takes an annual shot for her osteoporosis. She has increased infirmity and dizziness since her stroke and tends to fall as a result.   She has a chronic history of constipation.  History provided by the patient and her husband present at bedside.  In the ER Foley placed, over 1200 cc urine  Review of Systems:  The patient denies anorexia, fever, weight loss, back pain, vision loss, decreased hearing, hoarseness, chest pain, syncope, dyspnea on exertion, constipation, peripheral edema, balance deficits, hemoptysis, abdominal pain, melena, hematochezia, severe indigestion/heartburn, hematuria, incontinence, genital sores, muscle weakness, suspicious skin lesions, transient blindness, difficulty walking, depression, unusual weight change, abnormal bleeding, enlarged lymph nodes, angioedema, and breast masses.  Past Medical History: Past Medical History:  Diagnosis Date  . At high risk for falls   . Basal cell carcinoma    right upper leg  . Complication of anesthesia    difficult to arouse  . Diabetes mellitus without complication (HCC)    Type I  . Diabetic neuropathy ([name])   . Diabetic retinopathy ([place])   . Dizziness   . Gastroparesis   . Herniated disc, cervical   . Hypercholesteremia   . Hypertension   . Hypotensive episode   . Hypothyroidism   . Insulin pump in place   . Myofascial muscle pain   . Numbness of arm    left  . Osteoporosis    . Poor vision   . Seizures ([place])   . Slurred speech   . 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 Stroke (HCC)    x5  . Vertigo   . Wears glasses    Past Surgical History:  Procedure Laterality Date  . APPENDECTOMY    . AUGMENTATION MAMMAPLASTY Bilateral 2011  . BILATERAL CARPAL TUNNEL RELEASE    . COLONOSCOPY    . EYE SURGERY    . HEMORROIDECTOMY    . HERNIA REPAIR    . JOINT REPLACEMENT    . OPEN REDUCTION INTERNAL FIXATION (ORIF) DISTAL RADIAL FRACTURE Right 05/29/2015   Procedure: OPEN REDUCTION INTERNAL FIXATION (ORIF) RIGHT DISTAL RADIUS FRACTURE WITH ALLOGRAFT BONE GRAFTING FOR REPAIR AND RECONSTRUCTION;  Surgeon: Roseanne Kaufman, MD;  Location: [place];  Service: Orthopedics;  Laterality: Right;  . SHOULDER ARTHROSCOPY Left   . SHOULDER HEMI-ARTHROPLASTY Left   . SYNOVECTOMY    . TONSILLECTOMY    . TRIGGER FINGER RELEASE    . TUBAL LIGATION      Medications: Prior to Admission medications   Medication Sig Start Date End Date Taking? Authorizing Provider  tiZANidine (ZANAFLEX) 2 MG tablet Take 2 mg by mouth Nightly. 09/06/18  Yes [provider]  aspirin EC 81 MG tablet Take 1 tablet (81 mg total) by mouth daily. 05/29/18   Hillary Bow, MD  atorvastatin (LIPITOR) 40 MG tablet Take 1 tablet (40 mg total) by mouth daily at 6 PM. Patient taking  differently: Take 40 mg by mouth every evening.  11/13/17   Demetrios Loll, MD  butalbital-acetaminophen-caffeine (FIORICET, ESGIC) [id number] MG tablet Take 0.5-1 tablets by mouth 2 (two) times daily as needed for headache. 06/19/18   Gillis Santa, MD  Calcium Carb-Cholecalciferol (CALCIUM 600 + D) 600-200 MG-UNIT TABS Take 1 tablet by mouth every evening.    [provider]  carboxymethylcellulose (REFRESH PLUS) 0.5 % SOLN Place 1 drop into both eyes 3 (three) times daily as needed (for dry eyes).     [provider]  cetirizine (ZYRTEC) 10 MG tablet Take 10 mg by mouth daily.    [provider]   Cholecalciferol (VITAMIN D-3) 5000 units TABS Take 5,000 Units by mouth daily.    [provider]  ferrous sulfate 325 (65 FE) MG tablet Take 325 mg by mouth every evening.    [provider]  gabapentin (NEURONTIN) 300 MG capsule 1 in the am, 1 at lunch and 3 at bedtime Patient taking differently: Take 300-900 mg by mouth See admin instructions. 300 mg every morning, 300 mg every lunchtime, and 900 mg at bedtime 04/11/18   Gillis Santa, MD  glucose 4 GM chewable tablet Chew 1 tablet by mouth as needed for low blood sugar.    [provider]  Insulin Human (INSULIN PUMP) SOLN Inject 0.4-4 each into the skin continuous. Uses Novolog. (Pt states she takes 20 units cont through out day.)    [provider]  losartan (COZAAR) 50 MG tablet Take 50 mg by mouth every evening.     [provider]  Multiple Vitamin (MULTIVITAMIN WITH MINERALS) TABS tablet Take 1 tablet by mouth every evening.    [provider]  Polyethyl Glycol-Propyl Glycol (SYSTANE) 0.4-0.3 % SOLN Apply 1 drop to eye as needed (for dry eyes).     [provider]  polyethylene glycol (MIRALAX / GLYCOLAX) packet Take 17 g by mouth every other day.    [provider]  promethazine (PHENERGAN) 25 MG suppository Place 25 mg rectally every 6 (six) hours as needed for nausea or vomiting.    [provider]  venlafaxine (EFFEXOR) 75 MG tablet Take 75 mg by mouth 2 (two) times daily.    [provider]  vitamin C (ASCORBIC ACID) 500 MG tablet Take 500 mg by mouth 2 (two) times daily.    [provider]  warfarin (COUMADIN) 5 MG tablet Take 7.5 mg by mouth every evening.     [provider]    Allergies:   Allergies  Allergen Reactions  . Codeine Nausea Only    Can take Vicodin and Percocet   . Meperidine Nausea Only  . Metoclopramide Hcl Nausea Only  . Oxycodone Nausea Only  . Stadol  [Butorphanol] Nausea Only  . Tussionex  Pennkinetic Er [Hydrocod Polst-Cpm Polst Er] Other (See Comments)    Filled patient with sugar   . Carbocaine  [Mepivacaine Hcl] Palpitations    Social History:  reports that he has never smoked. He has never used smokeless tobacco. He reports that he does not drink alcohol or use drugs.  Family History: Family History  Problem Relation Age of Onset  . Hypertension Mother   . Stroke Mother   . Heart failure Father     Physical Exam: Vitals:   09/15/18 2045 09/15/18 2100 09/15/18 2115 09/15/18 2130  BP:  (!) 144/74  (!) 185/89  Pulse: 80 87    Resp: 11 (!) 9 13 15   Temp:  TempSrc:      SpO2: 100% 100%    Weight:      Height:        General:  Alert and oriented times three, well developed and nourished, no acute distress Eyes: PERRLA, pink conjunctiva, no scleral icterus ENT: Moist oral mucosa, neck supple, no thyromegaly Lungs: clear to ascultation, no wheeze, no crackles, no use of accessory muscles Cardiovascular: regular rate and rhythm, no regurgitation, no gallops, no murmurs. No carotid bruits, no JVD Abdomen: soft, positive BS, non-tender, non-distended, no organomegaly, not an acute abdomen GU: not examined Neuro: CN II - XII grossly intact, sensation intact Musculoskeletal: strength 5/5 all extremities, no clubbing, cyanosis or edema.  Positive low back pain Skin: no rash, no subcutaneous crepitation, no decubitus Psych: appropriate patient   Labs on Admission:  Recent Labs    09/15/18 1808  NA 137  K 4.0  CL 100  CO2 29  GLUCOSE 352*  BUN 18  CREATININE 0.55  CALCIUM 8.9   Recent Labs    09/15/18 1808  AST 28  ALT 20  ALKPHOS 176*  BILITOT 0.6  PROT 6.7  ALBUMIN 3.7   No results for input(s): LIPASE, AMYLASE in the last 72 hours. Recent Labs    09/15/18 1808  WBC 9.7  NEUTROABS 8.1*  HGB 12.0  HCT 36.2  MCV 93.3  PLT 299   No results for input(s): CKTOTAL, CKMB, CKMBINDEX, TROPONINI in the last 72 hours. Invalid input(s):  POCBNP No results for input(s): DDIMER in the last 72 hours. No results for input(s): HGBA1C in the last 72 hours. No results for input(s): CHOL, HDL, LDLCALC, TRIG, CHOLHDL, LDLDIRECT in the last 72 hours. No results for input(s): TSH, T4TOTAL, T3FREE, THYROIDAB in the last 72 hours.  Invalid input(s): FREET3 No results for input(s): VITAMINB12, FOLATE, FERRITIN, TIBC, IRON, RETICCTPCT in the last 72 hours.  Micro Results: No results found for this or any previous visit (from the past 240 hour(s)).   Radiological Exams on Admission: Ct Pelvis W Contrast  Result Date: 09/15/2018 CLINICAL DATA:  Severe pain in the coccyx area after a fall EXAM: CT PELVIS WITH CONTRAST TECHNIQUE: Multidetector CT imaging of the pelvis was performed using the standard protocol following the bolus administration of intravenous contrast. CONTRAST:  137mL ISOVUE-300 IOPAMIDOL (ISOVUE-300) INJECTION 61% COMPARISON:  CT 11/13/2017 FINDINGS: Urinary Tract:  No abnormality visualized. Bowel:  Copious stool in the colon.  No bowel wall thickening. Vascular/Lymphatic: No aneurism. Moderate aortic atherosclerosis. No pelvic adenopathy Reproductive:  No mass or other significant abnormality Other:  Presacral edema and soft tissue stranding. Musculoskeletal: Acute nondisplaced. Linear lucency involving the right sacral ala consistent with nondisplaced fracture. Additional acute mildly displaced left sacral ala fracture. Linear sclerosis within the right sacral body at the S3 and S4 level suggesting subacute fracture. No SI joint widening. IMPRESSION: 1. Acute appearing bilateral sacral ala fractures. Linear sclerosis within the mid to distal sacral body on the right side, suspicious for subacute sacral fracture. 2. Acute nondisplaced right transverse process fracture Electronically Signed   By: Donavan Foil M.D.   On: 09/15/2018 20:48    Assessment/Plan Present on Admission: Sacral fracture/ right transverse process fracture  at L5 /intractable pain/ Linear lucency involving the right sacral ala consistent with nondisplaced fracture -Admit to MedSurg -Fractures likely secondary patient's low impact falls and her osteoporosis.  Fentanyl patch 25 mcg daily -Patient already on any medications for osteoporosis and daily calcium.  We will continue  . Acute  urinary retention -Patient's urinary retention multifactorial.  Patient has been diabetic type I for over 50 years.  Neuropathy from this could be causing some of her urinary retention. She also has significant constipation, this could be causing some obstruction.  Also patient with lumbar fractures.  Will order MRI of lumbar spine in the morning to evaluate for any nerve impingement that could be contributing. -Foley placed  Constipation  -chronic per patient related to her gastroparesis.  Mag citrate ordered  . Essential hypertension -Stable, home meds resumed  . Hyperlipidemia -Stable, home meds resumed  Diabetes mellitus type 1/gastroparesis -Sliding scale insulin ordered  . Stroke ([place]) -Stable, home meds resumed including her Coumadin.  Pharmacy to manage   Adleigh Mcmasters 09/15/2018, 10:00 PM

## 2018-09-15 NOTE — ED Notes (Signed)
Pt placed on oxygen at 2lpm via [place] for pox 84%.

## 2018-09-15 NOTE — ED Notes (Signed)
Pt appears calmer, but continues to report 10/10 pain.

## 2018-09-16 ENCOUNTER — Inpatient Hospital Stay: Payer: Medicare Other

## 2018-09-16 LAB — CBC
HCT: 37.7 % (ref 36.0–46.0)
Hemoglobin: 12.4 g/dL (ref 12.0–15.0)
MCH: 31.2 pg (ref 26.0–34.0)
MCHC: 32.9 g/dL (ref 30.0–36.0)
MCV: 95 fL (ref 80.0–100.0)
Platelets: 296 10*3/uL (ref 150–400)
RBC: 3.97 MIL/uL (ref 3.87–5.11)
RDW: 11.9 % (ref 11.5–15.5)
WBC: 9.4 10*3/uL (ref 4.0–10.5)
nRBC: 0 % (ref 0.0–0.2)

## 2018-09-16 LAB — BASIC METABOLIC PANEL
Anion gap: 7 (ref 5–15)
BUN: 16 mg/dL (ref 8–23)
CO2: 32 mmol/L (ref 22–32)
Calcium: 9.2 mg/dL (ref 8.9–10.3)
Chloride: 101 mmol/L (ref 98–111)
Creatinine, Ser: 0.5 mg/dL (ref 0.44–1.00)
GFR calc Af Amer: >60 mL/min (ref >60)
GLUCOSE: 240 mg/dL — AB (ref 70–99)
Potassium: 4 mmol/L (ref 3.5–5.1)
Sodium: 140 mmol/L (ref 135–145)

## 2018-09-16 LAB — GLUCOSE, CAPILLARY
Glucose-Capillary: 112 mg/dL — ABNORMAL HIGH (ref 70–99)
Glucose-Capillary: 295 mg/dL — ABNORMAL HIGH (ref 70–99)
Glucose-Capillary: 303 mg/dL — ABNORMAL HIGH (ref 70–99)
Glucose-Capillary: 337 mg/dL — ABNORMAL HIGH (ref 70–99)
Glucose-Capillary: 95 mg/dL (ref 70–99)

## 2018-09-16 MED ORDER — TRAMADOL HCL 50 MG PO TABS
50.0000 mg | ORAL_TABLET | Freq: Four times a day (QID) | ORAL | Status: DC | PRN
  Administered 2018-09-16 – 2018-09-18 (×5): 50 mg via ORAL
  Filled 2018-09-16 (×6): qty 1

## 2018-09-16 MED ORDER — ACETAMINOPHEN 325 MG PO TABS
650.0000 mg | ORAL_TABLET | Freq: Four times a day (QID) | ORAL | Status: DC | PRN
  Administered 2018-09-16: 650 mg via ORAL

## 2018-09-16 MED ORDER — ACETAMINOPHEN 325 MG PO TABS
ORAL_TABLET | ORAL | Status: AC
  Filled 2018-09-16: qty 2

## 2018-09-16 MED ORDER — FENTANYL 25 MCG/HR TD PT72
1.0000 | MEDICATED_PATCH | TRANSDERMAL | Status: DC
  Administered 2018-09-16: 1 via TRANSDERMAL
  Filled 2018-09-16: qty 1

## 2018-09-16 MED ORDER — INSULIN GLARGINE 100 UNIT/ML ~~LOC~~ SOLN
6.0000 [IU] | Freq: Every day | SUBCUTANEOUS | Status: DC
  Administered 2018-09-16 – 2018-09-17 (×2): 6 [IU] via SUBCUTANEOUS
  Filled 2018-09-16 (×3): qty 0.06

## 2018-09-16 MED ORDER — HYDROMORPHONE HCL 1 MG/ML IJ SOLN
0.5000 mg | Freq: Four times a day (QID) | INTRAMUSCULAR | Status: DC | PRN
  Administered 2018-09-16 – 2018-09-17 (×3): 0.5 mg via INTRAVENOUS
  Filled 2018-09-16 (×3): qty 1

## 2018-09-16 NOTE — NC FL2 (Signed)
[place] LEVEL OF CARE SCREENING TOOL     IDENTIFICATION  Patient Name: Erin Daniels Birthdate: 10-Dec-1945 Sex: adult Admission Date (Current Location): 09/15/2018  [place] and [place] Number:  Engineering geologist and Address:  [facility], [address] [place], [place] [address]      Provider Number: [id number]  Attending Physician Name and Address:  Saundra Shelling, MD  Relative Name and Phone Number:       Current Level of Care: Hospital Recommended Level of Care: [place] Prior Approval Number:    Date Approved/Denied:   PASRR Number: [id number])  Discharge Plan: SNF    Current Diagnoses: Patient Active Problem List   Diagnosis Date Noted  . Constipation 09/15/2018  . Acute urinary retention 09/15/2018  . Intractable pain 09/15/2018  . Stroke ([place]) 05/28/2018  . Diabetic polyneuropathy associated with type 1 diabetes mellitus ([place]) 12/10/2017  . Numbness and tingling of both legs 12/10/2017  . Chronic pain syndrome 12/10/2017  . DKA (diabetic ketoacidoses) ([place]) 11/12/2017  . Confusion 11/11/2017  . Hypoglycemia 11/26/2016  . Hypokalemia   . Hypothermia   . Lactic acid acidosis   . Sepsis ([place])   . Type 1 diabetes mellitus with complication ([place])   . Hyponatremia   . Essential hypertension   . Hyperlipidemia   . Depression   . Right radial fracture 05/29/2015    Orientation RESPIRATION BLADDER Height & Weight     Self, Time, Situation, Place  O2(2 Liters Oxygen. ) Continent Weight: 108 lb (49 kg) Height:  5\' 2"  (157.5 cm)  BEHAVIORAL SYMPTOMS/MOOD NEUROLOGICAL BOWEL NUTRITION STATUS      Continent Diet(Diet: Heart Healthy/ Carb Modified. )  AMBULATORY STATUS COMMUNICATION OF NEEDS Skin   Extensive Assist Verbally Normal                       Personal Care Assistance Level of Assistance  Bathing, Feeding, Dressing Bathing Assistance: Limited assistance Feeding assistance:  Independent Dressing Assistance: Limited assistance     Functional Limitations Info  Sight, Hearing, Speech Sight Info: Adequate Hearing Info: Adequate Speech Info: Adequate    SPECIAL CARE FACTORS FREQUENCY  PT (By licensed PT), OT (By licensed OT)     PT Frequency: (5) OT Frequency: (5)            Contractures      Additional Factors Info  Code Status, Allergies Code Status Info: (Full Code. ) Allergies Info: (Codeine, Meperidine, Metoclopramide Hcl, Oxycodone, Stadol Butorphanol, Tussionex Pennkinetic Er Hydrocod Polst-cpm Polst Er, Carbocaine  Mepivacaine Hcl)           Current Medications (09/16/2018):  This is the current hospital active medication list Current Facility-Administered Medications  Medication Dose Route Frequency Provider Last Rate Last Dose  . aspirin EC tablet 81 mg  81 mg Oral Daily Crosley, Debby, MD      . atorvastatin (LIPITOR) tablet 40 mg  40 mg Oral q1800 Crosley, Debby, MD      . fentaNYL (DURAGESIC) 25 MCG/HR 1 patch  1 patch Transdermal Q72H Saundra Shelling, MD   1 patch at 09/16/18 1012  . gabapentin (NEURONTIN) capsule 300 mg  300 mg Oral BID Crosley, Debby, MD      . gabapentin (NEURONTIN) capsule 900 mg  900 mg Oral QHS Crosley, Debby, MD      . HYDROmorphone (DILAUDID) injection 0.5 mg  0.5 mg Intravenous Q6H PRN Saundra Shelling, MD      .  insulin aspart (novoLOG) injection 0-15 Units  0-15 Units Subcutaneous TID WC Quintella Baton, MD   11 Units at 09/16/18 0973  . insulin aspart (novoLOG) injection 0-5 Units  0-5 Units Subcutaneous QHS Quintella Baton, MD   4 Units at 09/16/18 0031  . insulin pump 1-4 each  1-4 each Subcutaneous Continuous Crosley, Debby, MD      . losartan (COZAAR) tablet 50 mg  50 mg Oral QPM Crosley, Debby, MD      . magnesium citrate solution 0.5 Bottle  0.5 Bottle Oral Once Crosley, Debby, MD      . ondansetron (ZOFRAN) injection 4 mg  4 mg Intravenous Once Crosley, Debby, MD      . tiZANidine (ZANAFLEX) tablet 2  mg  2 mg Oral Nightly Crosley, Debby, MD      . traMADol (ULTRAM) tablet 50 mg  50 mg Oral Q6H PRN Pyreddy, Reatha Harps, MD      . venlafaxine (EFFEXOR) tablet 75 mg  75 mg Oral BID Claria Dice, Debby, MD   75 mg at 09/16/18 0031  . warfarin (COUMADIN) tablet 7.5 mg  7.5 mg Oral QPM Quintella Baton, MD         Discharge Medications: Please see discharge summary for a list of discharge medications.  Relevant Imaging Results:  Relevant Lab Results:   Additional Information (SSN: [id number])  Taim Wurm, Veronia Beets, LCSW

## 2018-09-16 NOTE — Evaluation (Signed)
Physical Therapy Evaluation Patient Details Name: Erin Daniels MRN: [id number] DOB: 1946/07/26 Today's Date: 09/16/2018   History of Present Illness  Pt is a 73 yo female patient with history of osteoporosis, herniated disks in the cervical area, hypertension, hypothyroidism, type 1 diabetes mellitus on insulin pump, CVA in the past currently under hospitalist service for lower lumbar sacral pain.  Assessment includes: sacral fracture, R transverse process fracture at L5 nondisplaced, h/o CVA, and ambulatory dysfunction.      Clinical Impression  Pt presents with deficits in strength, transfers, mobility, gait, balance, and activity tolerance.  Pt reported sacral pain as 10/10 at rest but was in NAD.  Pt attempted to assist during sup to sit moving both LEs slightly toward the EOB but ultimately was too limited by pain to exert much force and required +2 max assist to get to sitting at the EOB.  Upon sitting up pt began crying out in pain saying "It's too much, I can't do this" and was assisted back to supine.  Pt positioned for comfort in sidelying with a pillow between her knees and reported feeling better in this position.  Pt will benefit from PT services in a SNF setting upon discharge to safely address above deficits for decreased caregiver assistance and eventual return to PLOF.      Follow Up Recommendations SNF    Equipment Recommendations  Other (comment)(TBD at next venue of care)    Recommendations for Other Services       Precautions / Restrictions Precautions Precautions: Fall Restrictions Weight Bearing Restrictions: No RLE Weight Bearing: (No WB restrictions noted in chart ) LLE Weight Bearing: (No WB restrictions noted in chart )      Mobility  Bed Mobility Overal bed mobility: Needs Assistance Bed Mobility: Supine to Sit;Sit to Supine     Supine to sit: +2 for physical assistance;Max assist Sit to supine: +2 for physical assistance;Max assist   General  bed mobility comments: Pt attempted to participate with bed mobility tasks but ultimately was too limited by pain to offer much assistance  Transfers                 General transfer comment: Pt unable to attempt to stand secondary to pain  Ambulation/Gait             General Gait Details: Unable  Stairs            Wheelchair Mobility    Modified Rankin (Stroke Patients Only)       Balance Overall balance assessment: (Unable to assess secondary to pain)                                           Pertinent Vitals/Pain Pain Assessment: 0-10 Pain Score: 10-Worst pain ever Pain Location: Pelvic pain Pain Descriptors / Indicators: Aching;Sore;Grimacing Pain Intervention(s): Limited activity within patient's tolerance;Premedicated before session;Monitored during session    Home Living Family/patient expects to be discharged to:: Private residence Living Arrangements: Spouse/significant other Available Help at Discharge: Family;Available 24 hours/day Type of Home: House Home Access: Stairs to enter Entrance Stairs-Rails: Can reach both;Left;Right Entrance Stairs-Number of Steps: 3 Home Layout: One level Home Equipment: None      Prior Function Level of Independence: Independent         Comments: Ind amb limited community distances without an AD, pt endorses multiple falls in the last  year from "losing my balance" but uncertain how many, Ind with ADLs     Hand Dominance   Dominant Hand: Right    Extremity/Trunk Assessment   Upper Extremity Assessment Upper Extremity Assessment: Generalized weakness    Lower Extremity Assessment Lower Extremity Assessment: Generalized weakness;LLE deficits/detail;RLE deficits/detail RLE: Unable to fully assess due to pain LLE: Unable to fully assess due to pain       Communication   Communication: No difficulties  Cognition Arousal/Alertness: Awake/alert Behavior During Therapy: Flat  affect Overall Cognitive Status: Within Functional Limits for tasks assessed                                        General Comments      Exercises     Assessment/Plan    PT Assessment Patient needs continued PT services  PT Problem List Decreased strength;Decreased activity tolerance;Decreased balance;Decreased mobility;Decreased knowledge of use of DME       PT Treatment Interventions DME instruction;Gait training;Stair training;Functional mobility training;Therapeutic activities;Therapeutic exercise;Balance training;Patient/family education    PT Goals (Current goals can be found in the Care Plan section)  Acute Rehab PT Goals Patient Stated Goal: To be able to walk without pain PT Goal Formulation: With patient Time For Goal Achievement: 09/29/18 Potential to Achieve Goals: Good    Frequency 7X/week   Barriers to discharge Inaccessible home environment      Co-evaluation               AM-PAC PT "6 Clicks" Mobility  Outcome Measure Help needed turning from your back to your side while in a flat bed without using bedrails?: Total Help needed moving from lying on your back to sitting on the side of a flat bed without using bedrails?: Total Help needed moving to and from a bed to a chair (including a wheelchair)?: Total Help needed standing up from a chair using your arms (e.g., wheelchair or bedside chair)?: Total Help needed to walk in hospital room?: Total Help needed climbing 3-5 steps with a railing? : Total 6 Click Score: 6    End of Session Equipment Utilized During Treatment: Gait belt Activity Tolerance: Patient limited by pain Patient left: in bed;with call bell/phone within reach;with bed alarm set;with SCD's reapplied Nurse Communication: Mobility status;Other (comment)(Pt's pain limiting participation during session) PT Visit Diagnosis: Muscle weakness (generalized) (M62.81);Difficulty in walking, not elsewhere classified  (R26.2);History of falling (Z91.81);Pain Pain - part of body: (Sacral area)    Time: 2409-7353 PT Time Calculation (min) (ACUTE ONLY): 25 min   Charges:   PT Evaluation $PT Eval Low Complexity: 1 Low          D. Scott Shasta Chinn PT, DPT 09/16/18, 11:27 AM

## 2018-09-16 NOTE — Progress Notes (Addendum)
[facility] [place] NAME: Erin Daniels    MR#:  [id number]  DATE OF BIRTH:  [birth year]  SUBJECTIVE:  CHIEF COMPLAINT:   Chief Complaint  Patient presents with  . Back Pain  Patient seen and evaluated today Has low back pain No bowel bladder incontinence No complaints of shortness of breath  REVIEW OF SYSTEMS:    ROS  CONSTITUTIONAL: No documented fever. No fatigue, weakness. No weight gain, no weight loss.  EYES: No blurry or double vision.  ENT: No tinnitus. No postnasal drip. No redness of the oropharynx.  RESPIRATORY: No cough, no wheeze, no hemoptysis. No dyspnea.  CARDIOVASCULAR: No chest pain. No orthopnea. No palpitations. No syncope.  GASTROINTESTINAL: No nausea, no vomiting or diarrhea. No abdominal pain. No melena or hematochezia.  GENITOURINARY: No dysuria or hematuria.  ENDOCRINE: No polyuria or nocturia. No heat or cold intolerance.  HEMATOLOGY: No anemia. No bruising. No bleeding.  INTEGUMENTARY: No rashes. No lesions.  MUSCULOSKELETAL: No arthritis. No swelling. No gout.  Low back pain NEUROLOGIC: No numbness, tingling, or ataxia. No seizure-type activity.  PSYCHIATRIC: No anxiety. No insomnia. No ADD.   DRUG ALLERGIES:   Allergies  Allergen Reactions  . Codeine Nausea Only and Other (See Comments)    Reaction: stress attacks/ headache Can take Vicodin and Percocet   . Meperidine Nausea Only and Other (See Comments)    Reaction: stress attacks/ headache  . Metoclopramide Hcl Nausea Only and Other (See Comments)    Reaction: stress attacks/ headache  . Oxycodone Nausea Only and Other (See Comments)    Reaction: stress attacks/ headache  . Stadol [Butorphanol] Nausea Only and Other (See Comments)    Reaction: stress attacks/ headache  . Tussionex Pennkinetic Er [Hydrocod Polst-Cpm Polst Er] Other (See Comments)    Filled patient with sugar   . Carbocaine  [Mepivacaine Hcl] Palpitations    VITALS:  Blood  pressure 140/66, pulse 93, temperature 98.7 F (37.1 C), resp. rate 14, height 5\' 2"  (1.575 m), weight 49 kg, SpO2 100 %.  PHYSICAL EXAMINATION:   Physical Exam  GENERAL:  73 y.o.-year-old patient lying in the bed with no acute distress.  EYES: Pupils equal, round, reactive to light and accommodation. No scleral icterus. Extraocular muscles intact.  HEENT: Head atraumatic, normocephalic. Oropharynx and nasopharynx clear.  NECK:  Supple, no jugular venous distention. No thyroid enlargement, no tenderness.  LUNGS: Normal breath sounds bilaterally, no wheezing, rales, rhonchi. No use of accessory muscles of respiration.  CARDIOVASCULAR: S1, S2 normal. No murmurs, rubs, or gallops.  ABDOMEN: Soft, nontender, nondistended. Bowel sounds present. No organomegaly or mass.  EXTREMITIES: No cyanosis, clubbing or edema b/l.    Lower lumbo sacral tenderness noted NEUROLOGIC: Cranial nerves II through XII are intact. No focal Motor or sensory deficits b/l.   PSYCHIATRIC: The patient is alert and oriented x 3.  SKIN: No obvious rash, lesion, or ulcer.   LABORATORY PANEL:   CBC Recent Labs  Lab 09/16/18 0348  WBC 9.4  HGB 12.4  HCT 37.7  PLT 296   ------------------------------------------------------------------------------------------------------------------ Chemistries  Recent Labs  Lab 09/15/18 1808 09/16/18 0348  NA 137 140  K 4.0 4.0  CL 100 101  CO2 29 32  GLUCOSE 352* 240*  BUN 18 16  CREATININE 0.55 0.50  CALCIUM 8.9 9.2  AST 28  --   ALT 20  --   ALKPHOS 176*  --   BILITOT 0.6  --    ------------------------------------------------------------------------------------------------------------------  Cardiac Enzymes No results for input(s): TROPONINI in the last 168 hours. ------------------------------------------------------------------------------------------------------------------  RADIOLOGY:  Ct Pelvis W Contrast  Result Date: 09/15/2018 CLINICAL DATA:  Severe  pain in the coccyx area after a fall EXAM: CT PELVIS WITH CONTRAST TECHNIQUE: Multidetector CT imaging of the pelvis was performed using the standard protocol following the bolus administration of intravenous contrast. CONTRAST:  197mL ISOVUE-300 IOPAMIDOL (ISOVUE-300) INJECTION 61% COMPARISON:  CT 11/13/2017 FINDINGS: Urinary Tract:  No abnormality visualized. Bowel:  Copious stool in the colon.  No bowel wall thickening. Vascular/Lymphatic: No aneurism. Moderate aortic atherosclerosis. No pelvic adenopathy Reproductive:  No mass or other significant abnormality Other:  Presacral edema and soft tissue stranding. Musculoskeletal: Acute nondisplaced right transverse process fracture at L5. Linear lucency involving the right sacral ala consistent with nondisplaced fracture. Additional acute mildly displaced left sacral ala fracture. Linear sclerosis within the right sacral body at the S3 and S4 level suggesting subacute fracture. No SI joint widening. IMPRESSION: 1. Acute appearing bilateral sacral ala fractures. Linear sclerosis within the mid to distal sacral body on the right side, suspicious for subacute sacral fracture. 2. Acute nondisplaced right transverse process fracture Electronically Signed   By: Donavan Foil M.D.   On: 09/15/2018 20:48     ASSESSMENT AND PLAN:  73 year old elderly African-American female patient with history of osteoporosis, herniated disks in the cervical area, hypertension, hypothyroidism, type 1 diabetes mellitus on insulin pump, CVA in the past currently under hospitalist service for lower lumbar sacral pain.  -Sacral fracture Right transverse process fracture at L5 nondisplaced Secondary to osteoporosis and low impact falls Fentanyl patch 25 mcg every 72 hours Oral tramadol for moderate pain Physical therapy evaluation Calcium supplements  -Constipation Stool softeners High-fiber diet  -Urinary retention Could be secondary to diabetic neuropathy Follow-up MRI  lumbosacral spine for any nerve impingement As needed Foley catheter  -History of CVA Continue anticoagulation with Coumadin Monitor hemoglobin  -Hyperlipidemia Continue statin  -Ambulatory dysfunction Skilled therapy evaluation for gait and balance training  All the records are reviewed and case discussed with Care Management/Social Worker. Management plans discussed with the patient, family and they are in agreement.  CODE STATUS: Full code  DVT Prophylaxis: SCDs  TOTAL TIME TAKING CARE OF THIS PATIENT: 36 minutes.   POSSIBLE D/C IN  1 to 2 DAYS, DEPENDING ON CLINICAL CONDITION.  Saundra Shelling M.D on 09/16/2018 at 9:30 AM  Between 7am to 6pm - Pager - [phone]  After 6pm go to [email or web address] - password [email or web address] [facility]  Office  [phone]  CC: Primary care physician; Juluis Pitch, MD  Note: This dictation was prepared with Dragon dictation along with smaller phrase technology. Any transcriptional errors that result from this process are unintentional.

## 2018-09-16 NOTE — Progress Notes (Signed)
ANTICOAGULATION CONSULT NOTE - Initial Consult  Pharmacy Consult for warfarin dosing Indication: hx CVA  Allergies  Allergen Reactions  . Codeine Nausea Only and Other (See Comments)    Reaction: stress attacks/ headache Can take Vicodin and Percocet   . Meperidine Nausea Only and Other (See Comments)    Reaction: stress attacks/ headache  . Metoclopramide Hcl Nausea Only and Other (See Comments)    Reaction: stress attacks/ headache  . Oxycodone Nausea Only and Other (See Comments)    Reaction: stress attacks/ headache  . Stadol [Butorphanol] Nausea Only and Other (See Comments)    Reaction: stress attacks/ headache  . Tussionex Pennkinetic Er [Hydrocod Polst-Cpm Polst Er] Other (See Comments)    Filled patient with sugar   . Carbocaine  [Mepivacaine Hcl] Palpitations    Patient Measurements: Height: 5\' 2"  (157.5 cm) Weight: 108 lb (49 kg) IBW/kg (Calculated) : 50.1 Heparin Dosing Weight: n/a  Vital Signs: Temp: 97.7 F (36.5 C) (01/26 1809) Temp Source: Oral (01/26 1809) BP: 150/39 (01/26 2348) Pulse Rate: 85 (01/26 2348)  Labs: Recent Labs    09/15/18 1808  HGB 12.0  HCT 36.2  PLT 299  LABPROT 29.0*  INR 2.79  CREATININE 0.55    Estimated Creatinine Clearance (by C-G formula based on SCr of 0.55 mg/dL) Female: 47.7 mL/min Female: 56.1 mL/min   Medical History: Past Medical History:  Diagnosis Date  . At high risk for falls   . Basal cell carcinoma    right upper leg  . Complication of anesthesia    difficult to arouse  . Diabetes mellitus without complication (HCC)    Type I  . Diabetic neuropathy ([place])   . Diabetic retinopathy ([place])   . Dizziness   . Gastroparesis   . Herniated disc, cervical   . Hypercholesteremia   . Hypertension   . Hypotensive episode   . Hypothyroidism   . Insulin pump in place   . Myofascial muscle pain   . Numbness of arm    left  . Osteoporosis   . Poor vision   . Seizures ([place])   . Slurred speech   . Stroke  (cerebrum) ([place])    4/19  . Stroke (HCC)    x5  . Vertigo   . Wears glasses     Medications:  Home dose 7.5 mg daily  Assessment: INR therapeutic on admission  Goal of Therapy:  INR 2-3   Plan:  Resume home regimen as above.  Antino Mayabb S 09/16/2018,12:22 AM

## 2018-09-16 NOTE — Plan of Care (Signed)

## 2018-09-16 NOTE — Clinical Social Work Placement (Signed)
   CLINICAL SOCIAL WORK PLACEMENT  NOTE  Date:  09/16/2018  Patient Details  Name: Erin Daniels MRN: [id number] Date of Birth: [birth year]  Clinical Social Work is seeking post-discharge placement for this patient at the [place] level of care (*CSW will initial, date and re-position this form in  chart as items are completed):  Yes   Patient/family provided with [facility] Work Department's list of facilities offering this level of care within the geographic area requested by the patient (or if unable, by the patient's family).  Yes   Patient/family informed of their freedom to choose among providers that offer the needed level of care, that participate in Medicare, Medicaid or managed care program needed by the patient, have an available bed and are willing to accept the patient.  Yes   Patient/family informed of [facility]'s ownership interest in [facility] and [facility], as well as of the fact that they are under no obligation to receive care at these facilities.  PASRR submitted to EDS on 09/16/18     PASRR number received on 09/16/18     Existing PASRR number confirmed on       FL2 transmitted to all facilities in geographic area requested by pt/family on 09/16/18     FL2 transmitted to all facilities within larger geographic area on       Patient informed that his/her managed care company has contracts with or will negotiate with certain facilities, including the following:        Yes   Patient/family informed of bed offers received.  Patient chooses bed at       Physician recommends and patient chooses bed at      Patient to be transferred to   on  .  Patient to be transferred to facility by       Patient family notified on   of transfer.  Name of family member notified:        PHYSICIAN       Additional Comment:    _______________________________________________ Dwana Garin, Veronia Beets, LCSW 09/16/2018, 2:59 PM

## 2018-09-16 NOTE — Clinical Social Work Note (Signed)
Clinical Social Work Assessment  Patient Details  Name: Erin Daniels MRN: [id number] Date of Birth: 20-May-1946  Date of referral:  09/16/18               Reason for consult:  Facility Placement                Permission sought to share information with:  Chartered certified accountant granted to share information::  Yes, Verbal Permission Granted  Name::      [name]::   [place]   Relationship::     Contact Information:     Housing/Transportation Living arrangements for the past 2 months:  [place] of Information:  Patient, Spouse Patient Interpreter Needed:  None Criminal Activity/Legal Involvement Pertinent to Current Situation/Hospitalization:  No - Comment as needed Significant Relationships:  Spouse, Siblings Lives with:  Spouse Do you feel safe going back to the place where you live?  Yes Need for family participation in patient care:  Yes (Comment)  Care giving concerns:  Patient lives in [place] on the [place] side with her husband Erin Daniels [phone].    Social Worker assessment / plan:  Holiday representative (CSW) reviewed chart and noted that PT is recommending SNF. CSW met with patient and her husband Erin Daniels and sister were at bedside. Patient was alert and oriented X4 and was laying in the bed. CSW introduced self and explained role of CSW department. Per husband patient lives with him in [place]. CSW explained that PT is recommending SNF. Per husband he would like to get patient's pain under control before they make a decision about SNF. CSW provided emotional support. CSW explained that medicare requires a 3 night qualifying inpatient hospital stay in order to pay for SNF. Patient was admitted to inpatient 09/15/18. Husband verbalized his understanding and is agreeable to SNF search in [place]. FL2 complete and faxed out.   CSW provided medicare.gov SNF list with bed offers to  husband. CSW discussed quality measures of the facilities with husband. Per husband he will review bed offers and get back with CSW. CSW will continue to follow and assist as needed.   Employment status:  Disabled (Comment on whether or not currently receiving Disability), Retired Forensic scientist:  Medicare PT Recommendations:  [place] / Referral to community resources:  [place]  Patient/Family's Response to care:  Patient and her husband are agreeable to [facility] in [place].   Patient/Family's Understanding of and Emotional Response to Diagnosis, Current Treatment, and Prognosis:  Patient and her husband were very pleasant and thanked CSW for assistance.   Emotional Assessment Appearance:  Appears stated age Attitude/Demeanor/Rapport:    Affect (typically observed):  Accepting, Adaptable, Pleasant Orientation:  Oriented to Self, Oriented to Place, Oriented to  Time, Oriented to Situation Alcohol / Substance use:  Not Applicable Psych involvement (Current and /or in the community):  No (Comment)  Discharge Needs  Concerns to be addressed:  Discharge Planning Concerns Readmission within the last 30 days:  No Current discharge risk:  Dependent with Mobility Barriers to Discharge:  Continued Medical Work up   [employer], Veronia Beets, LCSW 09/16/2018, 3:01 PM

## 2018-09-16 NOTE — Progress Notes (Signed)
Inpatient Diabetes Program Recommendations  AACE/ADA: New Consensus Statement on Inpatient Glycemic Control (2015)  Target Ranges:  Prepandial:   less than 140 mg/dL      Peak postprandial:   less than 180 mg/dL (1-2 hours)      Critically ill patients:  140 - 180 mg/dL   Results for Erin Daniels, Erin Daniels (MRN [id number]) as of 09/16/2018 07:29  Ref. Range 09/15/2018 18:07 09/16/2018 00:27  Glucose-Capillary Latest Ref Range: 70 - 99 mg/dL 322 (H) 337 (H)  4 units NOVOLOG    Results for [name], [name] (MRN [id number]) as of 09/16/2018 07:29  Ref. Range 05/29/2018 05:56  Hemoglobin A1C Latest Ref Range: 4.8 - 5.6 % 6.9 (H)    Admit with: Sacral fracture/ right transverse process fracture at L5 /intractable pain  History: Type 1 DM (since 1968), CVA  Home DM Meds: Insulin Pump (Omni Pod)  Current Orders: Insulin Pump      Novolog Moderate Correction Scale/ SSI (0-15 units) TID AC + HS     Spoke with RN this AM.  RN told me that pt's husband took her insulin pump off and took it home.  Pt will need basal insulin while off pump.  Called Dr. Estanislado Pandy to discuss.  Got order to start Lantus 6 units Daily (start today).  Pt gets about 6 units basal insulin on her pump in a 24 hour period.  Alerted RN caring for pt of new Lantus order.    Endocrinologist: Dr. Frederik Pear with [facility]--last seen 06/17/2018.  Insulin Pump settings at that visit were as follows: Omnipod Basal: MN 0.25; 5a 0.3; noon 0.4; 4p 0.3; 7p 0.2; I/C: mn 1:25; 7AM 1:10; 10am 1:5 Insulin On Board: 4 ISF: 80 BG target 120 TDD Basal: 6.85     --Will follow patient during hospitalization--  Wyn Quaker RN, MSN, CDE Diabetes Coordinator Inpatient Glycemic Control Team Team Pager: [phone] (8a-5p)

## 2018-09-17 LAB — CBC
HCT: 35.5 % — ABNORMAL LOW (ref 36.0–46.0)
Hemoglobin: 11.6 g/dL — ABNORMAL LOW (ref 12.0–15.0)
MCH: 30.8 pg (ref 26.0–34.0)
MCHC: 32.7 g/dL (ref 30.0–36.0)
MCV: 94.2 fL (ref 80.0–100.0)
PLATELETS: 294 10*3/uL (ref 150–400)
RBC: 3.77 MIL/uL — ABNORMAL LOW (ref 3.87–5.11)
RDW: 11.9 % (ref 11.5–15.5)
WBC: 7 10*3/uL (ref 4.0–10.5)
nRBC: 0 % (ref 0.0–0.2)

## 2018-09-17 LAB — GLUCOSE, CAPILLARY
Glucose-Capillary: 137 mg/dL — ABNORMAL HIGH (ref 70–99)
Glucose-Capillary: 158 mg/dL — ABNORMAL HIGH (ref 70–99)
Glucose-Capillary: 134 mg/dL — ABNORMAL HIGH (ref 70–99)
Glucose-Capillary: 156 mg/dL — ABNORMAL HIGH (ref 70–99)
Glucose-Capillary: 145 mg/dL — ABNORMAL HIGH (ref 70–99)

## 2018-09-17 LAB — PROTIME-INR
INR: 2.68
Prothrombin Time: 28.1 seconds — ABNORMAL HIGH (ref 11.4–15.2)

## 2018-09-17 MED ORDER — OXYCODONE HCL ER 15 MG PO T12A
15.0000 mg | EXTENDED_RELEASE_TABLET | Freq: Two times a day (BID) | ORAL | Status: DC
  Administered 2018-09-17 – 2018-09-18 (×3): 15 mg via ORAL
  Filled 2018-09-17 (×3): qty 1

## 2018-09-17 MED ORDER — HYDROMORPHONE HCL 1 MG/ML IJ SOLN
0.5000 mg | Freq: Two times a day (BID) | INTRAMUSCULAR | Status: DC | PRN
  Filled 2018-09-17: qty 1

## 2018-09-17 NOTE — Progress Notes (Signed)
ANTICOAGULATION CONSULT NOTE - Initial Consult  Pharmacy Consult for warfarin dosing Indication: hx CVA  Allergies  Allergen Reactions  . Codeine Nausea Only and Other (See Comments)    Reaction: stress attacks/ headache Can take Vicodin and Percocet   . Meperidine Nausea Only and Other (See Comments)    Reaction: stress attacks/ headache  . Metoclopramide Hcl Nausea Only and Other (See Comments)    Reaction: stress attacks/ headache  . Oxycodone Nausea Only and Other (See Comments)    Reaction: stress attacks/ headache  . Stadol [Butorphanol] Nausea Only and Other (See Comments)    Reaction: stress attacks/ headache  . Tussionex Pennkinetic Er [Hydrocod Polst-Cpm Polst Er] Other (See Comments)    Filled patient with sugar   . Carbocaine  [Mepivacaine Hcl] Palpitations    Patient Measurements: Height: 5\' 2"  (157.5 cm) Weight: 108 lb (49 kg) IBW/kg (Calculated) : 50.1 Heparin Dosing Weight: n/a  Vital Signs: Temp: 98.2 F (36.8 C) (01/27 2316) BP: 139/66 (01/28 0107) Pulse Rate: 82 (01/28 0107)  Labs: Recent Labs    09/15/18 1808 09/16/18 0348 09/17/18 0327  HGB 12.0 12.4 11.6*  HCT 36.2 37.7 35.5*  PLT 299 296 294  LABPROT 29.0*  --  28.1*  INR 2.79  --  2.68  CREATININE 0.55 0.50  --     Estimated Creatinine Clearance (by C-G formula based on SCr of 0.5 mg/dL) Female: 47.7 mL/min Female: 56.1 mL/min   Medical History: Past Medical History:  Diagnosis Date  . At high risk for falls   . Basal cell carcinoma    right upper leg  . Complication of anesthesia    difficult to arouse  . Diabetes mellitus without complication (HCC)    Type I  . Diabetic neuropathy ([place])   . Diabetic retinopathy ([place])   . Dizziness   . Gastroparesis   . Herniated disc, cervical   . Hypercholesteremia   . Hypertension   . Hypotensive episode   . Hypothyroidism   . Insulin pump in place   . Myofascial muscle pain   . Numbness of arm    left  . Osteoporosis   . Poor  vision   . Seizures ([place])   . Slurred speech   . 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 Stroke (HCC)    x5  . Vertigo   . Wears glasses     Medications:  Home dose 7.5 mg daily  Assessment: INR therapeutic on admission  Date INR Warfarin Dose  1/27 2.79 (1/26) 7.5 mg   1/28 2.68 7.5 mg    Goal of Therapy:  INR 2-3   Plan:  INR therapeutic. Will continue home dose of warfarin. Daily INR ordered and will follow up with CBC.   Oswald Hillock, PharmD, BCPS  Clinical Pharmacist 09/17/2018,7:28 AM

## 2018-09-17 NOTE — Progress Notes (Signed)
Physical Therapy Treatment Patient Details Name: Erin Daniels MRN: [id number] DOB: 10-15-1945 Today's Date: 09/17/2018    History of Present Illness Pt is a 73 yo female patient with history of osteoporosis, herniated disks in the cervical area, hypertension, hypothyroidism, type 1 diabetes mellitus on insulin pump, CVA in the past currently under hospitalist service for lower lumbar sacral pain.  Assessment includes: sacral fracture, R transverse process fracture at L5 nondisplaced, h/o CVA, and ambulatory dysfunction.      PT Comments    Pt in bed, 8/10 pain.  Reports having had pain medication.  Participated in exercises as described below.  Attempted sitting edge of bed.  While she was able to roll to side lying with rails and min a x 1 pain increased rapidly and she was unable to move beyond side lying position.  Further mobility was deferred due to discomfort and pt stating "Please help me"  "I'm dying" due to pain.  Pt was assisted to a position that was more comfortable but it took several minutes for pain to subside and her to calm.  Further activity deferred at this time.   Follow Up Recommendations  SNF     Equipment Recommendations       Recommendations for Other Services       Precautions / Restrictions Precautions Precautions: Fall Restrictions Weight Bearing Restrictions: No    Mobility  Bed Mobility Overal bed mobility: Needs Assistance Bed Mobility: Rolling Rolling: Min assist   Supine to sit: Total assist     General bed mobility comments: Attetmpts at bed mobility caused significant increase in pain.  Unable to get past rolling to sidelying.  Transfers                 General transfer comment: Pt unable to attempt to stand secondary to pain  Ambulation/Gait             General Gait Details: Unable   Stairs             Wheelchair Mobility    Modified Rankin (Stroke Patients Only)       Balance                                             Cognition Arousal/Alertness: Awake/alert Behavior During Therapy: WFL for tasks assessed/performed Overall Cognitive Status: Within Functional Limits for tasks assessed                                        Exercises Other Exercises Other Exercises: ankle pumps, heel slides, ab/add and SLR AAROM BLE's.  LLE more painful than RLE    General Comments        Pertinent Vitals/Pain Pain Assessment: 0-10 Pain Score: 8  Pain Location: Pelvic pain  8 at rest, 10/10 with attempts at mobility Pain Descriptors / Indicators: Aching;Grimacing;Guarding;Discomfort Pain Intervention(s): Limited activity within patient's tolerance;Premedicated before session    Home Living                      Prior Function            PT Goals (current goals can now be found in the care plan section) Progress towards PT goals: Not progressing toward goals - comment    Frequency  7X/week      PT Plan Current plan remains appropriate    Co-evaluation              AM-PAC PT "6 Clicks" Mobility   Outcome Measure  Help needed turning from your back to your side while in a flat bed without using bedrails?: A Lot Help needed moving from lying on your back to sitting on the side of a flat bed without using bedrails?: Total Help needed moving to and from a bed to a chair (including a wheelchair)?: Total Help needed standing up from a chair using your arms (e.g., wheelchair or bedside chair)?: Total Help needed to walk in hospital room?: Total Help needed climbing 3-5 steps with a railing? : Total 6 Click Score: 7    End of Session   Activity Tolerance: Patient limited by pain Patient left: in bed;with call bell/phone within reach;with bed alarm set         Time: 0258-5277 PT Time Calculation (min) (ACUTE ONLY): 19 min  Charges:  $Therapeutic Exercise: 8-22 mins                     Chesley Noon, PTA 09/17/18, 9:48  AM

## 2018-09-17 NOTE — Progress Notes (Signed)
Patient's husband [name] requested that Holiday representative (CSW) send SNF referral to [facility] SN[facility] in [place]. CSW sent referral and contacted [facility] admissions coordinator [place]. Per Claiborne Billings they have no female beds and can't make an offer for patient. Patient and her husband are aware of above and chose [facility]. Patient and husband are agreeable to semi-private room at [facility]. [facility] admissions coordinator at [facility] is aware of accepted bed offer. CSW will continue to follow and assist as needed.   [employer], LCSW [phone]

## 2018-09-17 NOTE — Progress Notes (Signed)
Inpatient Diabetes Program Recommendations  AACE/ADA: New Consensus Statement on Inpatient Glycemic Control (2015)  Target Ranges:  Prepandial:   less than 140 mg/dL      Peak postprandial:   less than 180 mg/dL (1-2 hours)      Critically ill patients:  140 - 180 mg/dL   Results for Erin Daniels, Erin Daniels (MRN [id number]) as of 09/17/2018 07:48  Ref. Range 09/16/2018 00:27 09/16/2018 08:16 09/16/2018 11:39 09/16/2018 17:01 09/16/2018 21:02  Glucose-Capillary Latest Ref Range: 70 - 99 mg/dL 337 (H)  4 units NOVOLOG  303 (H)  11 units NOVOLOG  112 (H)    295 (H)  8 units NOVOLOG +  6 units LANTUS  95   Results for Erin Daniels, Erin Daniels (MRN [id number]) as of 09/17/2018 12:02  Ref. Range 09/17/2018 07:35 09/17/2018 11:34  Glucose-Capillary Latest Ref Range: 70 - 99 mg/dL 137 (H)  2 units NOVOLOG +  6 units LANTUS 158 (H)   Admit with: Sacral fracture/right transverse process fracture at L5 /intractable pain  History: Type 1 DM (since 1968), CVA  Home DM Meds: Insulin Pump (Omni Pod)  Current Orders: Lantus 6 units Daily                            Novolog Moderate Correction Scale/ SSI (0-15 units) TID AC + HS     Endocrinologist: Dr. Frederik Pear with [facility]--last seen 06/17/2018.  Insulin Pump settings at that visit were as follows: Omnipod Basal: MN 0.25; 5a 0.3; noon 0.4; 4p 0.3; 7p 0.2; I/C: mn 1:25; 7AM 1:10; 10am 1:5 Insulin On Board: 4 ISF: 80 BG target 120 TDD Basal: 6.85  Spoke with RN caring for pt yesterday (01/27).  RN told me that pt's husband took her insulin pump off and took it home.   Lantus started yesterday around 5pm.  CBGs stable so far today.    --Will follow patient during hospitalization--  Wyn Quaker RN, MSN, CDE Diabetes Coordinator Inpatient Glycemic Control Team Team Pager: [phone] (8a-5p)

## 2018-09-17 NOTE — Progress Notes (Signed)
[facility] [place] NAME: Darcey Cardy    MR#:  [id number]  DATE OF BIRTH:  11-23-1945  SUBJECTIVE:  CHIEF COMPLAINT:   Chief Complaint  Patient presents with  . Back Pain  Patient seen and evaluated today Has severe low back pain which interferes with standing and ambulation No bowel bladder incontinence No complaints of shortness of breath No tingling or numbness Status post physical therapy evaluation  REVIEW OF SYSTEMS:    ROS  CONSTITUTIONAL: No documented fever. No fatigue, weakness. No weight gain, no weight loss.  EYES: No blurry or double vision.  ENT: No tinnitus. No postnasal drip. No redness of the oropharynx.  RESPIRATORY: No cough, no wheeze, no hemoptysis. No dyspnea.  CARDIOVASCULAR: No chest pain. No orthopnea. No palpitations. No syncope.  GASTROINTESTINAL: No nausea, no vomiting or diarrhea. No abdominal pain. No melena or hematochezia.  GENITOURINARY: No dysuria or hematuria.  ENDOCRINE: No polyuria or nocturia. No heat or cold intolerance.  HEMATOLOGY: No anemia. No bruising. No bleeding.  INTEGUMENTARY: No rashes. No lesions.  MUSCULOSKELETAL: No arthritis. No swelling. No gout.  Low back pain NEUROLOGIC: No numbness, tingling, or ataxia. No seizure-type activity.  PSYCHIATRIC: No anxiety. No insomnia. No ADD.   DRUG ALLERGIES:   Allergies  Allergen Reactions  . Codeine Nausea Only and Other (See Comments)    Reaction: stress attacks/ headache Can take Vicodin and Percocet   . Meperidine Nausea Only and Other (See Comments)    Reaction: stress attacks/ headache  . Metoclopramide Hcl Nausea Only and Other (See Comments)    Reaction: stress attacks/ headache  . Oxycodone Nausea Only and Other (See Comments)    Reaction: stress attacks/ headache  . Stadol [Butorphanol] Nausea Only and Other (See Comments)    Reaction: stress attacks/ headache  . Tussionex Pennkinetic Er [Hydrocod Polst-Cpm Polst Er]  Other (See Comments)    Filled patient with sugar   . Carbocaine  [Mepivacaine Hcl] Palpitations    VITALS:  Blood pressure (!) 146/70, pulse 76, temperature 98 F (36.7 C), temperature source Oral, resp. rate 16, height 5\' 2"  (1.575 m), weight 49 kg, SpO2 100 %.  PHYSICAL EXAMINATION:   Physical Exam  GENERAL:  73 y.o.-year-old patient lying in the bed with no acute distress.  EYES: Pupils equal, round, reactive to light and accommodation. No scleral icterus. Extraocular muscles intact.  HEENT: Head atraumatic, normocephalic. Oropharynx and nasopharynx clear.  NECK:  Supple, no jugular venous distention. No thyroid enlargement, no tenderness.  LUNGS: Normal breath sounds bilaterally, no wheezing, rales, rhonchi. No use of accessory muscles of respiration.  CARDIOVASCULAR: S1, S2 normal. No murmurs, rubs, or gallops.  ABDOMEN: Soft, nontender, nondistended. Bowel sounds present. No organomegaly or mass.  EXTREMITIES: No cyanosis, clubbing or edema b/l.    Lower lumbo sacral tenderness noted NEUROLOGIC: Cranial nerves II through XII are intact. No focal Motor or sensory deficits b/l.   PSYCHIATRIC: The patient is alert and oriented x 3.  SKIN: No obvious rash, lesion, or ulcer.   LABORATORY PANEL:   CBC Recent Labs  Lab 09/17/18 0327  WBC 7.0  HGB 11.6*  HCT 35.5*  PLT 294   ------------------------------------------------------------------------------------------------------------------ Chemistries  Recent Labs  Lab 09/15/18 1808 09/16/18 0348  NA 137 140  K 4.0 4.0  CL 100 101  CO2 29 32  GLUCOSE 352* 240*  BUN 18 16  CREATININE 0.55 0.50  CALCIUM 8.9 9.2  AST 28  --  ALT 20  --   ALKPHOS 176*  --   BILITOT 0.6  --    ------------------------------------------------------------------------------------------------------------------  Cardiac Enzymes No results for input(s): TROPONINI in the last 168  hours. ------------------------------------------------------------------------------------------------------------------  RADIOLOGY:  Ct Pelvis W Contrast  Result Date: 09/15/2018 CLINICAL DATA:  Severe pain in the coccyx area after a fall EXAM: CT PELVIS WITH CONTRAST TECHNIQUE: Multidetector CT imaging of the pelvis was performed using the standard protocol following the bolus administration of intravenous contrast. CONTRAST:  125mL ISOVUE-300 IOPAMIDOL (ISOVUE-300) INJECTION 61% COMPARISON:  CT 11/13/2017 FINDINGS: Urinary Tract:  No abnormality visualized. Bowel:  Copious stool in the colon.  No bowel wall thickening. Vascular/Lymphatic: No aneurism. Moderate aortic atherosclerosis. No pelvic adenopathy Reproductive:  No mass or other significant abnormality Other:  Presacral edema and soft tissue stranding. Musculoskeletal: Acute nondisplaced right transverse process fracture at L5. Linear lucency involving the right sacral ala consistent with nondisplaced fracture. Additional acute mildly displaced left sacral ala fracture. Linear sclerosis within the right sacral body at the S3 and S4 level suggesting subacute fracture. No SI joint widening. IMPRESSION: 1. Acute appearing bilateral sacral ala fractures. Linear sclerosis within the mid to distal sacral body on the right side, suspicious for subacute sacral fracture. 2. Acute nondisplaced right transverse process fracture Electronically Signed   By: Donavan Foil M.D.   On: 09/15/2018 20:48   Mr Lumbar Spine Wo Contrast  Result Date: 09/16/2018 CLINICAL DATA:  Back and sacral pain. EXAM: MRI LUMBAR SPINE WITHOUT CONTRAST TECHNIQUE: Multiplanar, multisequence MR imaging of the lumbar spine was performed. No intravenous contrast was administered. COMPARISON:  CT pelvis 09/15/2018 FINDINGS: Segmentation: There are five lumbar type vertebral bodies. The last full intervertebral disc space is labeled L5-S1. Alignment: Severe scoliosis. Normal overall  alignment in the sagittal plane. Vertebrae: No lumbar compression fractures are identified. No bone lesions. Endplate reactive changes noted mainly at L4-5. Bilateral sacral fractures are noted as demonstrated on the recent pelvic CT scan. Conus medullaris and cauda equina: Conus extends to the T12-L1 level. Conus and cauda equina appear normal. Paraspinal and other soft tissues: No significant paraspinal or retroperitoneal findings. Disc levels: L1-2: Mild annular bulge and mild to moderate facet disease. Mild left foraminal stenosis and mild bilateral lateral recess stenosis, left greater than right. L1-2: Advanced disc disease and mild to moderate facet disease. There is a bulging degenerated annulus and osteophytic ridging with flattening of the ventral thecal sac and mild bilateral lateral recess stenosis, left greater than right. Mild left foraminal encroachment also. L3-4: Moderate facet disease but no disc protrusions, spinal or foraminal stenosis. L4-5: Advanced disc disease and facet disease. There is a bulging degenerated annulus, osteophytic ridging and facet disease all contributing to moderate to moderately severe spinal and bilateral lateral recess stenosis and right greater than left foraminal stenosis. L5-S1: Moderate facet disease but no disc protrusions, spinal or foraminal stenosis. IMPRESSION: 1. Scoliosis and degenerative lumbar spondylosis. 2. No acute lumbar compression fractures. 3. Bilateral sacral fractures. 4. Mild bilateral lateral recess stenosis and left foraminal stenosis at L1-2 and L2-3. 5. Moderate to moderately severe multifactorial spinal, bilateral lateral recess and foraminal stenosis at L4-5. Electronically Signed   By: Marijo Sanes M.D.   On: 09/16/2018 12:43     ASSESSMENT AND PLAN:  73 year old elderly African-American female patient with history of osteoporosis, herniated disks in the cervical area, hypertension, hypothyroidism, type 1 diabetes mellitus on insulin  pump, CVA in the past currently under hospitalist service for lower lumbar sacral  pain.  -Sacral fracture acute Right transverse process fracture at L5 nondisplaced Secondary to osteoporosis and low impact falls Fentanyl patch 25 mcg every 72 hours Oral tramadol for moderate pain Continue oral gabapentin for neuropathic pain PRN IV Dilaudid for severe pain Physical therapy evaluation appreciated Calcium supplements Will get orthopedic surgery evaluation  -Degenerative lumbar spondylosis along with spinal stenosis Pain management Physical therapy  -Constipation Stool softeners High-fiber diet  -Urinary retention Could be secondary to diabetic neuropathy As needed Foley catheter  -History of CVA Continue anticoagulation with Coumadin Monitor hemoglobin  -Hyperlipidemia Continue statin  -Type 2 diabetes mellitus Sliding scale coverage with insulin along with Lantus insulin daily with diabetic diet  -Ambulatory dysfunction Physical therapy sessions SNF placement Social worker follow-up  All the records are reviewed and case discussed with Care Management/Social Worker. Management plans discussed with the patient, family and they are in agreement.  CODE STATUS: Full code  DVT Prophylaxis: SCDs  TOTAL TIME TAKING CARE OF THIS PATIENT: 35 minutes.   POSSIBLE D/C IN  1 to 2 DAYS, DEPENDING ON CLINICAL CONDITION.  Saundra Shelling M.D on 09/17/2018 at 10:50 AM  Between 7am to 6pm - Pager - [phone]  After 6pm go to [email or web address] - password [email or web address] [facility]  Office  [phone]  CC: Primary care physician; Juluis Pitch, MD  Note: This dictation was prepared with Dragon dictation along with smaller phrase technology. Any transcriptional errors that result from this process are unintentional.

## 2018-09-17 NOTE — Consult Note (Signed)
ORTHOPAEDIC CONSULTATION  REQUESTING PHYSICIAN: Saundra Shelling, MD  Chief Complaint:   Sacral pain  History of Present Illness: Erin Daniels is a 73 y.o. adult with a history of osteoporosis who has had multiple falls recently.  The patient cannot remember 1 particular fall that caused her pain.  Pain is been going on for approximately 10 days.  She has been on pain medications without any significant Relief of her symptoms.  She presented to the emergency department on 09/15/2018 where a CT scan showed bilateral sacral ala fractures with minimal displacement.  There is also a nondisplaced right transverse process fracture of L5.The patient ambulates with a walker at baseline.  Pain is described as sharp at its worst and a dull ache at its best.  Pain is rated a 10 out of 10 in severity at its worst.  Pain is improved with rest. Pain is worse with any sort of movement.  She has not had any bowel or bladder changes.   Past Medical History:  Diagnosis Date  . At high risk for falls   . Basal cell carcinoma    right upper leg  . Complication of anesthesia    difficult to arouse  . Diabetes mellitus without complication (HCC)    Type I  . Diabetic neuropathy ([place])   . Diabetic retinopathy ([place])   . Dizziness   . Gastroparesis   . Herniated disc, cervical   . Hypercholesteremia   . Hypertension   . Hypotensive episode   . Hypothyroidism   . Insulin pump in place   . Myofascial muscle pain   . Numbness of arm    left  . Osteoporosis   . Poor vision   . Seizures ([place])   . Slurred speech   . 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 Stroke (HCC)    x5  . Vertigo   . Wears glasses    Past Surgical History:  Procedure Laterality Date  . APPENDECTOMY    . AUGMENTATION MAMMAPLASTY Bilateral 2011  . BILATERAL CARPAL TUNNEL RELEASE    . COLONOSCOPY    . EYE SURGERY    . HEMORROIDECTOMY    . HERNIA REPAIR    . JOINT  REPLACEMENT    . OPEN REDUCTION INTERNAL FIXATION (ORIF) DISTAL RADIAL FRACTURE Right 05/29/2015   Procedure: OPEN REDUCTION INTERNAL FIXATION (ORIF) RIGHT DISTAL RADIUS FRACTURE WITH ALLOGRAFT BONE GRAFTING FOR REPAIR AND RECONSTRUCTION;  Surgeon: Roseanne Kaufman, MD;  Location: [facility];  Service: Orthopedics;  Laterality: Right;  . SHOULDER ARTHROSCOPY Left   . SHOULDER HEMI-ARTHROPLASTY Left   . SYNOVECTOMY    . TONSILLECTOMY    . TRIGGER FINGER RELEASE    . TUBAL LIGATION     Social History   Socioeconomic History  . Marital status: Married    Spouse name: Not on file  . Number of children: Not on file  . Years of education: Not on file  . Highest education level: Not on file  Occupational History  . Not on file  Social Needs  . Financial resource strain: Not on file  . Food insecurity:    Worry: Not on file    Inability: Not on file  . Transportation needs:    Medical: Not on file    Non-medical: Not on file  Tobacco Use  . Smoking status: Never Smoker  . Smokeless tobacco: Never Used  Substance and Sexual Activity  . Alcohol use: No  . Drug use: No  .  Sexual activity: Not on file  Lifestyle  . Physical activity:    Days per week: Not on file    Minutes per session: Not on file  . Stress: Not on file  Relationships  . Social connections:    Talks on phone: Not on file    Gets together: Not on file    Attends religious service: Not on file    Active member of club or organization: Not on file    Attends meetings of clubs or organizations: Not on file    Relationship status: Not on file  Other Topics Concern  . Not on file  Social History Narrative   Lives at home with her husband.  Unsteady gait.  Walks without any assistive device at this time   Family History  Problem Relation Age of Onset  . Hypertension Mother   . Stroke Mother   . Heart failure Father    Allergies  Allergen Reactions  . Codeine Nausea Only and Other (See Comments)    Reaction: stress  attacks/ headache Can take Vicodin and Percocet   . Meperidine Nausea Only and Other (See Comments)    Reaction: stress attacks/ headache  . Metoclopramide Hcl Nausea Only and Other (See Comments)    Reaction: stress attacks/ headache  . Oxycodone Nausea Only and Other (See Comments)    Reaction: stress attacks/ headache  . Stadol [Butorphanol] Nausea Only and Other (See Comments)    Reaction: stress attacks/ headache  . Tussionex Pennkinetic Er [Hydrocod Polst-Cpm Polst Er] Other (See Comments)    Filled patient with sugar   . Carbocaine  [Mepivacaine Hcl] Palpitations   Prior to Admission medications   Medication Sig Start Date End Date Taking? Authorizing Provider  aspirin EC 325 MG tablet Take 325 mg by mouth daily.   Yes [provider]  atorvastatin (LIPITOR) 40 MG tablet Take 1 tablet (40 mg total) by mouth daily at 6 PM. Patient taking differently: Take 40 mg by mouth every evening.  11/13/17  Yes Demetrios Loll, MD  Calcium Carb-Cholecalciferol (CALCIUM 600 + D) 600-200 MG-UNIT TABS Take 1 tablet by mouth every evening.   Yes [provider]  carboxymethylcellulose (REFRESH PLUS) 0.5 % SOLN Place 1 drop into both eyes as needed (for dry eyes).    Yes [provider]  cetirizine (ZYRTEC) 10 MG tablet Take 10 mg by mouth daily.   Yes [provider]  Cholecalciferol (VITAMIN D-3) 5000 units TABS Take 5,000 Units by mouth daily.   Yes [provider]  ferrous sulfate 325 (65 FE) MG tablet Take 325 mg by mouth every evening.   Yes [provider]  gabapentin (NEURONTIN) 300 MG capsule 1 in the am, 1 at lunch and 3 at bedtime Patient taking differently: Take 300-900 mg by mouth See admin instructions. 300 mg every morning, 300 mg every lunchtime, and 900 mg at bedtime 04/11/18  Yes Lateef, Bilal, MD  GARLIC PO Take 1 capsule by mouth every evening.   Yes [provider]  Insulin Disposable Pump (OMNIPOD 5 PACK) MISC Inject 1 each  into the skin continuous. Uses Novolog. (Pt states she takes 20 units cont through out day.) 08/16/18  Yes [provider]  losartan (COZAAR) 50 MG tablet Take 50 mg by mouth every evening.    Yes [provider]  Multiple Vitamin (MULTIVITAMIN WITH MINERALS) TABS tablet Take 1 tablet by mouth every evening.   Yes [provider]  polyethylene glycol (MIRALAX / GLYCOLAX)  packet Take 17 g by mouth every other day.   Yes [provider]  venlafaxine (EFFEXOR) 75 MG tablet Take 75 mg by mouth 2 (two) times daily.   Yes [provider]  vitamin C (ASCORBIC ACID) 500 MG tablet Take 500 mg by mouth 2 (two) times daily.   Yes [provider]  warfarin (COUMADIN) 5 MG tablet Take 7.5 mg by mouth every evening.    Yes [provider]  butalbital-acetaminophen-caffeine (FIORICET, ESGIC) 50-325-40 MG tablet Take 0.5-1 tablets by mouth 2 (two) times daily as needed for headache. 06/19/18   Gillis Santa, MD  glucose 4 GM chewable tablet Chew 1 tablet by mouth as needed for low blood sugar.    [provider]  Polyethyl Glycol-Propyl Glycol (SYSTANE) 0.4-0.3 % SOLN Apply 1 drop to eye as needed (for dry eyes).     [provider]  promethazine (PHENERGAN) 25 MG suppository Place 25 mg rectally every 6 (six) hours as needed for nausea or vomiting.    [provider]   Recent Labs    09/15/18 1808 09/16/18 0348 09/17/18 0327  WBC 9.7 9.4 7.0  HGB 12.0 12.4 11.6*  HCT 36.2 37.7 35.5*  PLT 299 296 294  K 4.0 4.0  --   CL 100 101  --   CO2 29 32  --   BUN 18 16  --   CREATININE 0.55 0.50  --   GLUCOSE 352* 240*  --   CALCIUM 8.9 9.2  --   INR 2.79  --  2.68   Ct Pelvis W Contrast  Result Date: 09/15/2018 CLINICAL DATA:  Severe pain in the coccyx area after a fall EXAM: CT PELVIS WITH CONTRAST TECHNIQUE: Multidetector CT imaging of the pelvis was performed using the standard protocol following the bolus  administration of intravenous contrast. CONTRAST:  160mL ISOVUE-300 IOPAMIDOL (ISOVUE-300) INJECTION 61% COMPARISON:  CT 11/13/2017 FINDINGS: Urinary Tract:  No abnormality visualized. Bowel:  Copious stool in the colon.  No bowel wall thickening. Vascular/Lymphatic: No aneurism. Moderate aortic atherosclerosis. No pelvic adenopathy Reproductive:  No mass or other significant abnormality Other:  Presacral edema and soft tissue stranding. Musculoskeletal: Acute nondisplaced right transverse process fracture at L5. Linear lucency involving the right sacral ala consistent with nondisplaced fracture. Additional acute mildly displaced left sacral ala fracture. Linear sclerosis within the right sacral body at the S3 and S4 level suggesting subacute fracture. No SI joint widening. IMPRESSION: 1. Acute appearing bilateral sacral ala fractures. Linear sclerosis within the mid to distal sacral body on the right side, suspicious for subacute sacral fracture. 2. Acute nondisplaced right transverse process fracture Electronically Signed   By: Donavan Foil M.D.   On: 09/15/2018 20:48   Mr Lumbar Spine Wo Contrast  Result Date: 09/16/2018 CLINICAL DATA:  Back and sacral pain. EXAM: MRI LUMBAR SPINE WITHOUT CONTRAST TECHNIQUE: Multiplanar, multisequence MR imaging of the lumbar spine was performed. No intravenous contrast was administered. COMPARISON:  CT pelvis 09/15/2018 FINDINGS: Segmentation: There are five lumbar type vertebral bodies. The last full intervertebral disc space is labeled L5-S1. Alignment: Severe scoliosis. Normal overall alignment in the sagittal plane. Vertebrae: No lumbar compression fractures are identified. No bone lesions. Endplate reactive changes noted mainly at L4-5. Bilateral sacral fractures are noted as demonstrated on the recent pelvic CT scan. Conus medullaris and cauda equina: Conus extends to the T12-L1 level. Conus and cauda equina appear normal. Paraspinal and other soft tissues: No  significant paraspinal or retroperitoneal findings. Disc  levels: L1-2: Mild annular bulge and mild to moderate facet disease. Mild left foraminal stenosis and mild bilateral lateral recess stenosis, left greater than right. L1-2: Advanced disc disease and mild to moderate facet disease. There is a bulging degenerated annulus and osteophytic ridging with flattening of the ventral thecal sac and mild bilateral lateral recess stenosis, left greater than right. Mild left foraminal encroachment also. L3-4: Moderate facet disease but no disc protrusions, spinal or foraminal stenosis. L4-5: Advanced disc disease and facet disease. There is a bulging degenerated annulus, osteophytic ridging and facet disease all contributing to moderate to moderately severe spinal and bilateral lateral recess stenosis and right greater than left foraminal stenosis. L5-S1: Moderate facet disease but no disc protrusions, spinal or foraminal stenosis. IMPRESSION: 1. Scoliosis and degenerative lumbar spondylosis. 2. No acute lumbar compression fractures. 3. Bilateral sacral fractures. 4. Mild bilateral lateral recess stenosis and left foraminal stenosis at L1-2 and L2-3. 5. Moderate to moderately severe multifactorial spinal, bilateral lateral recess and foraminal stenosis at L4-5. Electronically Signed   By: Marijo Sanes M.D.   On: 09/16/2018 12:43     Positive ROS: All other systems have been reviewed and were otherwise negative with the exception of those mentioned in the HPI and as above.  Physical Exam: BP (!) 134/59 (BP Location: Right Arm)   Pulse 72   Temp 97.6 F (36.4 C) (Oral)   Resp 18   Ht 5\' 2"  (1.575 m)   Wt 49 kg   SpO2 97%   BMI 19.75 kg/m  General:  Alert, no acute distress Psychiatric:  Patient is competent for consent with normal mood and affect   Cardiovascular:  No pedal edema, regular rate and rhythm Respiratory:  No wheezing, non-labored breathing GI:  Abdomen is soft and non-tender Skin:  No  lesions in the area of chief complaint, no erythema Neurologic:  Sensation intact distally, CN grossly intact Lymphatic:  No axillary or cervical lymphadenopathy  Orthopedic Exam:  Bilateral lower extremities: - 5/5 DF/PF/EHL, KF/KE, HF - SILT L2-S1 distributions - Feet wwp - No pathologic reflexes - No pathologic clonus; normal Babinksi sign - Negative bilateral straight leg raise - TTP over lower sacrum   X-rays:  As above: L intertrochanteric hip fracture  Assessment/Plan: TARANEH Daniels is a 73 y.o. adult with bilateral sacral ala fractures that are minimally displaced as well as a right transverse process fracture with minimal displacement at L5.  She is neurologically intact.   1.  We discussed the clinical and radiographic findings.  We discussed both surgical and nonsurgical management options.  Given the minimal displacement of her fractures as well as the relatively stable nature of the fracture patterns, I recommended nonsurgical management.  The patient was in agreement. 2.  Weightbearing as tolerated, likely with assistive devices for some time.  Can wean off of assistive devices as pain improves. 3.  Recommend analgesia with Tylenol and/or NSAIDs as tolerated.  May require narcotic use for the short-term.  4.  Physical therapy for mobility  5.  Can follow-up with me in approximately 3-4 weeks.  Please page with any questions.    Leim Fabry   09/17/2018 5:01 PM

## 2018-09-18 ENCOUNTER — Encounter
Admission: RE | Admit: 2018-09-18 | Discharge: 2018-09-18 | Disposition: A | Discharge status: Home / Self Care | Payer: Medicare Other | Source: Ambulatory Visit | Attending: Internal Medicine | Admitting: Internal Medicine

## 2018-09-18 LAB — CBC
HCT: 35.5 % — ABNORMAL LOW (ref 36.0–46.0)
Hemoglobin: 11.7 g/dL — ABNORMAL LOW (ref 12.0–15.0)
MCH: 30.8 pg (ref 26.0–34.0)
MCHC: 33 g/dL (ref 30.0–36.0)
MCV: 93.4 fL (ref 80.0–100.0)
Platelets: 280 K/uL (ref 150–400)
RBC: 3.8 MIL/uL — ABNORMAL LOW (ref 3.87–5.11)
RDW: 11.7 % (ref 11.5–15.5)
WBC: 7.8 K/uL (ref 4.0–10.5)
nRBC: 0 % (ref 0.0–0.2)

## 2018-09-18 LAB — PROTIME-INR
INR: 2.79
Prothrombin Time: 29 s — ABNORMAL HIGH (ref 11.4–15.2)

## 2018-09-18 LAB — GLUCOSE, CAPILLARY
Glucose-Capillary: 320 mg/dL — ABNORMAL HIGH (ref 70–99)
Glucose-Capillary: 230 mg/dL — ABNORMAL HIGH (ref 70–99)
Glucose-Capillary: 90 mg/dL (ref 70–99)

## 2018-09-18 MED ORDER — FLEET ENEMA 7-19 GM/118ML RE ENEM: 1.0000 | ENEMA | Freq: Every day | RECTAL | Status: DC | PRN

## 2018-09-18 MED ORDER — TRAMADOL HCL 50 MG PO TABS: 50.0000 mg | ORAL_TABLET | Freq: Four times a day (QID) | ORAL | 0 refills | Status: DC | PRN

## 2018-09-18 MED ORDER — INSULIN GLARGINE 100 UNIT/ML ~~LOC~~ SOLN: 8.0000 [IU] | Freq: Every day | SUBCUTANEOUS | 11 refills | Status: DC

## 2018-09-18 MED ORDER — DOCUSATE SODIUM 100 MG PO CAPS
100.0000 mg | ORAL_CAPSULE | Freq: Two times a day (BID) | ORAL | Status: DC
  Administered 2018-09-18: 100 mg via ORAL
  Filled 2018-09-18: qty 1

## 2018-09-18 MED ORDER — FENTANYL 25 MCG/HR TD PT72: 1.0000 | MEDICATED_PATCH | TRANSDERMAL | 0 refills | Status: DC

## 2018-09-18 MED ORDER — POLYETHYLENE GLYCOL 3350 17 G PO PACK
17.0000 g | PACK | Freq: Two times a day (BID) | ORAL | Status: DC
  Administered 2018-09-18: 17 g via ORAL
  Filled 2018-09-18: qty 1

## 2018-09-18 MED ORDER — ACETAMINOPHEN 325 MG PO TABS: 650.0000 mg | ORAL_TABLET | Freq: Four times a day (QID) | ORAL | 0 refills | Status: DC | PRN

## 2018-09-18 MED ORDER — INSULIN ASPART 100 UNIT/ML ~~LOC~~ SOLN: 5.0000 [IU] | Freq: Three times a day (TID) | SUBCUTANEOUS | 0 refills | Status: DC

## 2018-09-18 MED ORDER — INSULIN GLARGINE 100 UNIT/ML ~~LOC~~ SOLN
8.0000 [IU] | Freq: Every day | SUBCUTANEOUS | Status: DC
  Filled 2018-09-18: qty 0.08

## 2018-09-18 MED ORDER — BISACODYL 10 MG RE SUPP
10.0000 mg | Freq: Every day | RECTAL | Status: DC | PRN
  Administered 2018-09-18: 10 mg via RECTAL
  Filled 2018-09-18: qty 1

## 2018-09-18 NOTE — Progress Notes (Signed)
Patient is medically stable for D/C to [facility] today. Patient will discharge on insulin regimen because [facility] does not have staff trained on an insulin pump. Per [facility] admissions coordinator at [facility] patient can come today to room 209-A. RN will call report at [phone] and arrange EMS for transport. Clinical Education officer, museum (CSW) sent D/C orders to [facility] via [employer]. Patient is aware of above. CSW attempted to contact patient's husband [name] however he did not answer and a voicemail was left. Please reconsult if future social work needs arise. CSW signing off.   [employer], LCSW [phone]

## 2018-09-18 NOTE — Progress Notes (Signed)
ANTICOAGULATION CONSULT NOTE - Initial Consult  Pharmacy Consult for warfarin dosing Indication: hx CVA  Allergies  Allergen Reactions  . Codeine Nausea Only and Other (See Comments)    Reaction: stress attacks/ headache Can take Vicodin and Percocet   . Meperidine Nausea Only and Other (See Comments)    Reaction: stress attacks/ headache  . Metoclopramide Hcl Nausea Only and Other (See Comments)    Reaction: stress attacks/ headache  . Oxycodone Nausea Only and Other (See Comments)    Reaction: stress attacks/ headache  . Stadol [Butorphanol] Nausea Only and Other (See Comments)    Reaction: stress attacks/ headache  . Tussionex Pennkinetic Er [Hydrocod Polst-Cpm Polst Er] Other (See Comments)    Filled patient with sugar   . Carbocaine  [Mepivacaine Hcl] Palpitations    Patient Measurements: Height: 5\' 2"  (157.5 cm) Weight: 108 lb (49 kg) IBW/kg (Calculated) : 50.1 Heparin Dosing Weight: n/a  Vital Signs: Temp: 98.5 F (36.9 C) (01/28 2356) Temp Source: Oral (01/28 2356) BP: 155/65 (01/28 2356) Pulse Rate: 81 (01/28 2356)  Labs: Recent Labs    09/15/18 1808 09/16/18 0348 09/17/18 0327 09/18/18 0310  HGB 12.0 12.4 11.6* 11.7*  HCT 36.2 37.7 35.5* 35.5*  PLT 299 296 294 280  LABPROT 29.0*  --  28.1* 29.0*  INR 2.79  --  2.68 2.79  CREATININE 0.55 0.50  --   --     Estimated Creatinine Clearance (by C-G formula based on SCr of 0.5 mg/dL) Female: 47.7 mL/min Female: 56.1 mL/min   Medical History: Past Medical History:  Diagnosis Date  . At high risk for falls   . Basal cell carcinoma    right upper leg  . Complication of anesthesia    difficult to arouse  . Diabetes mellitus without complication (HCC)    Type I  . Diabetic neuropathy ([place])   . Diabetic retinopathy ([place])   . Dizziness   . Gastroparesis   . Herniated disc, cervical   . Hypercholesteremia   . Hypertension   . Hypotensive episode   . Hypothyroidism   . Insulin pump in place   .  Myofascial muscle pain   . Numbness of arm    left  . Osteoporosis   . Poor vision   . Seizures ([place])   . Slurred speech   . 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 Stroke (HCC)    x5  . Vertigo   . Wears glasses     Medications:  Home dose 7.5 mg daily  Assessment: INR therapeutic on admission  Date INR Warfarin Dose  1/27 2.79 (1/26) 7.5 mg   1/28 2.68 7.5 mg  1/29 2.79 7.5 mg    Goal of Therapy:  INR 2-3   Plan:  INR therapeutic. Will continue home dose of warfarin. Daily INR ordered    Oswald Hillock, PharmD, BCPS  Clinical Pharmacist 09/18/2018,7:32 AM

## 2018-09-18 NOTE — Progress Notes (Signed)
Inpatient Diabetes Program Recommendations  AACE/ADA: New Consensus Statement on Inpatient Glycemic Control   Target Ranges:  Prepandial:   less than 140 mg/dL      Peak postprandial:   less than 180 mg/dL (1-2 hours)      Critically ill patients:  140 - 180 mg/dL   Results for Erin Daniels, Erin Daniels (MRN [id number]) as of 09/18/2018 09:12  Ref. Range 09/17/2018 07:35 09/17/2018 11:34 09/17/2018 15:58 09/17/2018 16:43 09/17/2018 21:40 09/18/2018 08:13  Glucose-Capillary Latest Ref Range: 70 - 99 mg/dL 137 (H) 158 (H) 134 (H) 156 (H) 145 (H) 320 (H)   Review of Glycemic Control  Diabetes history: DM1 (makes NO insulin; requires basal, correction, and meal coverage insulin) Outpatient Diabetes medications: OmniPod insulin pump with Novolog Current orders for Inpatient glycemic control: Lantus 6 units daily, Novolog 0-15 units TID with meals  Inpatient Diabetes Program Recommendations:  Insulin - Basal: Please consider increasing Lantus to 8 units daily. Correction (SSI): Please decrease Novolog correction to 0-9 units TID with meals and add Novolog 0-5 units QHS for bedtime correction. Insulin - Meal Coverage: Please consider ordering Novolog 2 units TID with meals for meal coverage if patient eats at least 50% of meals.  NOTE: Noted fasting glucose 320 mg/dl this morning and patient received Novolog 11 units for correction. Concerned about patient experiencing hypoglycemia due to getting so much Novolog correction this morning. Patient has Type 1 DM and per insulin pump settings noted in chart, 1 unit of insulin drops glucose 80 mg/dl.  Recommend increasing Lantus slightly to 8 units daily, decreasing Novolog correction scale to sensitive scale, and adding Novolog 2 units TID with meals for meal coverage if patient is eating at least 50% of meals.    Thanks, Barnie Alderman, RN, MSN, CDE Diabetes Coordinator Inpatient Diabetes Program [phone] (Team Pager from 8am to 5pm)

## 2018-09-18 NOTE — Progress Notes (Signed)
Advanced care plan.  Purpose of the Encounter: CODE STATUS  Parties in Attendance: Patient  Patient's Decision Capacity: Good  Subjective/Patient's story: Presented to the emergency room for pain in the sacrum   Objective/Medical story Imaging study showed sacral fracture Has intractable back pain Severe spinal stenosis Needs pain management and physical therapy   Goals of care determination:  Advance care directives and goals of care discussed, patient wants everything done which includes cpr, intubation and ventilator if need arises.   CODE STATUS: Full code   Time spent discussing advanced care planning: 16 minutes

## 2018-09-18 NOTE — Care Management Important Message (Signed)
Important Message  Patient Details  Name: Erin Daniels MRN: [id number] Date of Birth: 10-18-1945   Medicare Important Message Given:  Yes    Juliann Pulse A Elizebeth Kluesner 09/18/2018, 11:12 AM

## 2018-09-18 NOTE — Clinical Social Work Placement (Signed)
   CLINICAL SOCIAL WORK PLACEMENT  NOTE  Date:  09/18/2018  Patient Details  Name: Erin Daniels MRN: [id number] Date of Birth: 10-07-1945  Clinical Social Work is seeking post-discharge placement for this patient at the [place] level of care (*CSW will initial, date and re-position this form in  chart as items are completed):  Yes   Patient/family provided with [facility] Work Department's list of facilities offering this level of care within the geographic area requested by the patient (or if unable, by the patient's family).  Yes   Patient/family informed of their freedom to choose among providers that offer the needed level of care, that participate in Medicare, Medicaid or managed care program needed by the patient, have an available bed and are willing to accept the patient.  Yes   Patient/family informed of [facility]'s ownership interest in [facility] and [facility], as well as of the fact that they are under no obligation to receive care at these facilities.  PASRR submitted to EDS on 09/16/18     PASRR number received on 09/16/18     Existing PASRR number confirmed on       FL2 transmitted to all facilities in geographic area requested by pt/family on 09/16/18     FL2 transmitted to all facilities within larger geographic area on       Patient informed that his/her managed care company has contracts with or will negotiate with certain facilities, including the following:        Yes   Patient/family informed of bed offers received.  Patient chooses bed at [facility] )     Physician recommends and patient chooses bed at      Patient to be transferred to [facility] ) on 09/18/18.  Patient to be transferred to facility by [facility] )     Patient family notified on 09/18/18 of transfer.  Name of family member notified:  ([place] left patient's husband [name] a Advertising account executive.  )     PHYSICIAN        Additional Comment:    _______________________________________________ Shoshanah Dapper, Veronia Beets, LCSW 09/18/2018, 1:50 PM

## 2018-09-18 NOTE — Progress Notes (Signed)
Called report to [facility] at [address]. Answered all questions. EMS called for transport.

## 2018-09-18 NOTE — Discharge Summary (Signed)
[facility] NAME: Erin Daniels    MR#:  [id number]  DATE OF BIRTH:  05/26/1946  DATE OF ADMISSION:  09/15/2018 ADMITTING PHYSICIAN: Quintella Baton, MD  DATE OF DISCHARGE: 09/18/2018  PRIMARY CARE PHYSICIAN: Juluis Pitch, MD   ADMISSION DIAGNOSIS:  Open fracture of sacrum, unspecified portion of sacrum, initial encounter ([place]) [S32.10XB]  DISCHARGE DIAGNOSIS:  Acute sacral fracture nondisplaced Bilateral sacral ala fractures Right transverse process fracture with minimal displacement at L5 Degenerative lumbar spondylosis Spinal stenosis Constipation Diabetic neuropathy Hyperlipidemia Type 2 diabetes mellitus  SECONDARY DIAGNOSIS:   Past Medical History:  Diagnosis Date  . At high risk for falls   . Basal cell carcinoma    right upper leg  . Complication of anesthesia    difficult to arouse  . Diabetes mellitus without complication (HCC)    Type I  . Diabetic neuropathy ([place])   . Diabetic retinopathy ([place])   . Dizziness   . Gastroparesis   . Herniated disc, cervical   . Hypercholesteremia   . Hypertension   . Hypotensive episode   . Hypothyroidism   . Insulin pump in place   . Myofascial muscle pain   . Numbness of arm    left  . Osteoporosis   . Poor vision   . Seizures ([place])   . Slurred speech   . 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 Stroke (HCC)    x5  . Vertigo   . Wears glasses      ADMITTING HISTORY this is a 73y/o female who presents with c/o horrible pain in her sacrum. She went to see her doctor on Monday.  X-rays done revealed impinged nerve. She was given pain pills and muscle relaxer.  Her pain did not improve.  She follow-up with her PCP and she was given steroids. Nothing helped. Moving caused extreme pain. 911 was finally called and she was brought in.  Husband she has frequent low impact falls from crouching positions.  she has osteoporosis. She is on calcium pills on and takes an annual  shot for her osteoporosis. She has increased infirmity and dizziness since her stroke and tends to fall as a result. She has a chronic history of constipation. History provided by the patient and her husband present at bedside. In the ER Foley placed, over 1200 cc urine  HOSPITAL COURSE:  Patient was admitted to medical floor.  She was worked up with MRI spine which showed degenerative lumbar spondylosis along with spinal stenosis.  Patient is neurologically intact. orthopedic surgery consultation was done with Dr. Posey Pronto who did not recommend any acute intervention and advised pain management.  Patient had constipation which was improved with laxatives.  Patient was evaluated by physical therapy for ambulatory dysfunction secondary to severe back pain.  SNF placement was recommended.  Family agreed with the plan.  Blood sugars were controlled with the help of NovoLog insulin and Lantus insulin.  Patient will be discharged to SNF will be off insulin pump but will continue blood sugar control with help of Lantus insulin and NovoLog insulin.  Pain management will continue with narcotics.  Physical therapy to continue. Weightbearing as tolerated  CONSULTS OBTAINED:  Treatment Team:  Leim Fabry, MD  DRUG ALLERGIES:   Allergies  Allergen Reactions  . Codeine Nausea Only and Other (See Comments)    Reaction: stress attacks/ headache Can take Vicodin and Percocet   . Meperidine Nausea Only and Other (See  Comments)    Reaction: stress attacks/ headache  . Metoclopramide Hcl Nausea Only and Other (See Comments)    Reaction: stress attacks/ headache  . Oxycodone Nausea Only and Other (See Comments)    Reaction: stress attacks/ headache  . Stadol [Butorphanol] Nausea Only and Other (See Comments)    Reaction: stress attacks/ headache  . Tussionex Pennkinetic Er [Hydrocod Polst-Cpm Polst Er] Other (See Comments)    Filled patient with sugar   . Carbocaine  [Mepivacaine Hcl] Palpitations     DISCHARGE MEDICATIONS:   Allergies as of 09/18/2018      Reactions   Codeine Nausea Only, Other (See Comments)   Reaction: stress attacks/ headache Can take Vicodin and Percocet    Meperidine Nausea Only, Other (See Comments)   Reaction: stress attacks/ headache   Metoclopramide Hcl Nausea Only, Other (See Comments)   Reaction: stress attacks/ headache   Oxycodone Nausea Only, Other (See Comments)   Reaction: stress attacks/ headache   Stadol [butorphanol] Nausea Only, Other (See Comments)   Reaction: stress attacks/ headache   Tussionex Pennkinetic Er [hydrocod Polst-cpm Polst Er] Other (See Comments)   Filled patient with sugar    Carbocaine  [mepivacaine Hcl] Palpitations      Medication List    TAKE these medications   acetaminophen 325 MG tablet Commonly known as:  TYLENOL Take 2 tablets (650 mg total) by mouth every 6 (six) hours as needed for headache. For mild pain   aspirin EC 325 MG tablet Take 325 mg by mouth daily.   atorvastatin 40 MG tablet Commonly known as:  LIPITOR Take 1 tablet (40 mg total) by mouth daily at 6 PM. What changed:  when to take this   butalbital-acetaminophen-caffeine 50-325-40 MG tablet Commonly known as:  FIORICET, ESGIC Take 0.5-1 tablets by mouth 2 (two) times daily as needed for headache.   CALCIUM 600 + D 600-200 MG-UNIT Tabs Generic drug:  Calcium Carb-Cholecalciferol Take 1 tablet by mouth every evening.   carboxymethylcellulose 0.5 % Soln Commonly known as:  REFRESH PLUS Place 1 drop into both eyes as needed (for dry eyes).   cetirizine 10 MG tablet Commonly known as:  ZYRTEC Take 10 mg by mouth daily.   fentaNYL 25 MCG/HR Commonly known as:  [place] 1 patch onto the skin every 3 (three) days. Start taking on:  September 19, 2018   ferrous sulfate 325 (65 FE) MG tablet Take 325 mg by mouth every evening.   gabapentin 300 MG capsule Commonly known as:  NEURONTIN 1 in the am, 1 at lunch and 3 at  bedtime What changed:    how much to take  how to take this  when to take this  additional instructions   GARLIC PO Take 1 capsule by mouth every evening.   glucose 4 GM chewable tablet Chew 1 tablet by mouth as needed for low blood sugar.   insulin aspart 100 UNIT/ML injection Commonly known as:  NOVOLOG Inject 5 Units into the skin 3 (three) times daily with meals for 30 days.   insulin glargine 100 UNIT/ML injection Commonly known as:  LANTUS Inject 0.08 mLs (8 Units total) into the skin daily. Start taking on:  September 19, 2018   losartan 50 MG tablet Commonly known as:  COZAAR Take 50 mg by mouth every evening.   multivitamin with minerals Tabs tablet Take 1 tablet by mouth every evening.   OMNIPOD 5 PACK Misc Inject 1 each into the skin continuous. Uses Novolog. (Pt  states she takes 20 units cont through out day.)   polyethylene glycol packet Commonly known as:  MIRALAX / GLYCOLAX Take 17 g by mouth every other day.   promethazine 25 MG suppository Commonly known as:  PHENERGAN Place 25 mg rectally every 6 (six) hours as needed for nausea or vomiting.   SYSTANE 0.4-0.3 % Soln Generic drug:  Polyethyl Glycol-Propyl Glycol Apply 1 drop to eye as needed (for dry eyes).   traMADol 50 MG tablet Commonly known as:  ULTRAM Take 1 tablet (50 mg total) by mouth every 6 (six) hours as needed for moderate pain.   venlafaxine 75 MG tablet Commonly known as:  EFFEXOR Take 75 mg by mouth 2 (two) times daily.   vitamin C 500 MG tablet Commonly known as:  ASCORBIC ACID Take 500 mg by mouth 2 (two) times daily.   Vitamin D-3 125 MCG (5000 UT) Tabs Take 5,000 Units by mouth daily.   warfarin 5 MG tablet Commonly known as:  COUMADIN Take 7.5 mg by mouth every evening.       Today  Patient seen today Back pain better controlled with pain management medication Tolerating diet well Received physical therapy Hemodynamically stable  VITAL SIGNS:  Blood  pressure (!) 151/70, pulse 90, temperature 98.3 F (36.8 C), resp. rate 16, height 5\' 2"  (1.575 m), weight 49 kg, SpO2 96 %.  I/O:    Intake/Output Summary (Last 24 hours) at 09/18/2018 1039 Last data filed at 09/18/2018 0504 Gross per 24 hour  Intake 120 ml  Output 800 ml  Net -680 ml    PHYSICAL EXAMINATION:  Physical Exam  GENERAL:  73 y.o.-year-old patient lying in the bed with no acute distress.  LUNGS: Normal breath sounds bilaterally, no wheezing, rales,rhonchi or crepitation. No use of accessory muscles of respiration.  CARDIOVASCULAR: S1, S2 normal. No murmurs, rubs, or gallops.  ABDOMEN: Soft, non-tender, non-distended. Bowel sounds present. No organomegaly or mass.  NEUROLOGIC: Moves all 4 extremities. PSYCHIATRIC: The patient is alert and oriented x 3.  SKIN: No obvious rash, lesion, or ulcer.   DATA REVIEW:   CBC Recent Labs  Lab 09/18/18 0310  WBC 7.8  HGB 11.7*  HCT 35.5*  PLT 280    Chemistries  Recent Labs  Lab 09/15/18 1808 09/16/18 0348  NA 137 140  K 4.0 4.0  CL 100 101  CO2 29 32  GLUCOSE 352* 240*  BUN 18 16  CREATININE 0.55 0.50  CALCIUM 8.9 9.2  AST 28  --   ALT 20  --   ALKPHOS 176*  --   BILITOT 0.6  --     Cardiac Enzymes No results for input(s): TROPONINI in the last 168 hours.  Microbiology Results  Results for orders placed or performed during the hospital encounter of 11/26/16  Blood Culture (routine x 2)     Status: None   Collection Time: 11/26/16  8:11 PM  Result Value Ref Range Status   Specimen Description BLOOD LEFT ANTECUBITAL  Final   Special Requests IN PEDIATRIC BOTTLE Blood Culture adequate volume  Final   Culture NO GROWTH 5 DAYS  Final   Report Status 12/01/2016 FINAL  Final  Blood Culture (routine x 2)     Status: None   Collection Time: 11/26/16  8:39 PM  Result Value Ref Range Status   Specimen Description BLOOD RIGHT ARM  Final   Special Requests   Final    AEROBIC BOTTLE ONLY Blood Culture results  may not  be optimal due to an inadequate volume of blood received in culture bottles   Culture NO GROWTH 5 DAYS  Final   Report Status 12/01/2016 FINAL  Final  MRSA PCR Screening     Status: None   Collection Time: 11/27/16  5:07 PM  Result Value Ref Range Status   MRSA by PCR NEGATIVE NEGATIVE Final    Comment:        The GeneXpert MRSA Assay (FDA approved for NASAL specimens only), is one component of a comprehensive MRSA colonization surveillance program. It is not intended to diagnose MRSA infection nor to guide or monitor treatment for MRSA infections.     RADIOLOGY:  Mr Lumbar Spine Wo Contrast  Result Date: 09/16/2018 CLINICAL DATA:  Back and sacral pain. EXAM: MRI LUMBAR SPINE WITHOUT CONTRAST TECHNIQUE: Multiplanar, multisequence MR imaging of the lumbar spine was performed. No intravenous contrast was administered. COMPARISON:  CT pelvis 09/15/2018 FINDINGS: Segmentation: There are five lumbar type vertebral bodies. The last full intervertebral disc space is labeled L5-S1. Alignment: Severe scoliosis. Normal overall alignment in the sagittal plane. Vertebrae: No lumbar compression fractures are identified. No bone lesions. Endplate reactive changes noted mainly at L4-5. Bilateral sacral fractures are noted as demonstrated on the recent pelvic CT scan. Conus medullaris and cauda equina: Conus extends to the T12-L1 level. Conus and cauda equina appear normal. Paraspinal and other soft tissues: No significant paraspinal or retroperitoneal findings. Disc levels: L1-2: Mild annular bulge and mild to moderate facet disease. Mild left foraminal stenosis and mild bilateral lateral recess stenosis, left greater than right. L1-2: Advanced disc disease and mild to moderate facet disease. There is a bulging degenerated annulus and osteophytic ridging with flattening of the ventral thecal sac and mild bilateral lateral recess stenosis, left greater than right. Mild left foraminal encroachment  also. L3-4: Moderate facet disease but no disc protrusions, spinal or foraminal stenosis. L4-5: Advanced disc disease and facet disease. There is a bulging degenerated annulus, osteophytic ridging and facet disease all contributing to moderate to moderately severe spinal and bilateral lateral recess stenosis and right greater than left foraminal stenosis. L5-S1: Moderate facet disease but no disc protrusions, spinal or foraminal stenosis. IMPRESSION: 1. Scoliosis and degenerative lumbar spondylosis. 2. No acute lumbar compression fractures. 3. Bilateral sacral fractures. 4. Mild bilateral lateral recess stenosis and left foraminal stenosis at L1-2 and L2-3. 5. Moderate to moderately severe multifactorial spinal, bilateral lateral recess and foraminal stenosis at L4-5. Electronically Signed   By: Marijo Sanes M.D.   On: 09/16/2018 12:43    Follow up with PCP in 1 week.  Management plans discussed with the patient, family and they are in agreement.  CODE STATUS: Full code    Code Status Orders  (From admission, onward)         Start     Ordered   09/15/18 2315  Full code  Continuous     09/15/18 2314        Code Status History    Date Active Date Inactive Code Status Order ID Comments User Context   11/11/2017 0648 11/13/2017 1700 Full Code [id number]  Harrie Foreman, MD Inpatient   11/26/2016 2325 11/30/2016 1850 Full Code [id number]  Sid Falcon, MD ED   05/29/2015 1352 05/31/2015 1522 Full Code [id number]  Roseanne Kaufman, MD Inpatient    Advance Directive Documentation     Most Recent Value  Type of Advance Directive  Healthcare Power of Attorney  Pre-existing out of facility DNR order (  yellow form or pink MOST form)  -  "MOST" Form in Place?  -      TOTAL TIME TAKING CARE OF THIS PATIENT ON DAY OF DISCHARGE: more than 35 minutes.   Saundra Shelling M.D on 09/18/2018 at 10:39 AM  Between 7am to 6pm - Pager - [phone]  After 6pm go to [email or web address] - password [email or web address]  [facility]  Office  [phone]  CC: Primary care physician; Juluis Pitch, MD  Note: This dictation was prepared with Dragon dictation along with smaller phrase technology. Any transcriptional errors that result from this process are unintentional.

## 2018-09-19 ENCOUNTER — Other Ambulatory Visit: Payer: Self-pay | Admitting: Adult Health

## 2018-09-19 ENCOUNTER — Encounter: Payer: Self-pay | Admitting: Adult Health

## 2018-09-19 ENCOUNTER — Other Ambulatory Visit
Admission: RE | Admit: 2018-09-19 | Discharge: 2018-09-19 | Disposition: A | Discharge status: Home / Self Care | Payer: No Typology Code available for payment source | Source: Skilled Nursing Facility | Attending: Adult Health | Admitting: Adult Health

## 2018-09-19 ENCOUNTER — Non-Acute Institutional Stay (SKILLED_NURSING_FACILITY): Payer: Medicare Other | Admitting: Adult Health

## 2018-09-19 DIAGNOSIS — E1042 Type 1 diabetes mellitus with diabetic polyneuropathy: Secondary | ICD-10-CM | POA: Diagnosis not present

## 2018-09-19 DIAGNOSIS — E785 Hyperlipidemia, unspecified: Secondary | ICD-10-CM

## 2018-09-19 DIAGNOSIS — F339 Major depressive disorder, recurrent, unspecified: Secondary | ICD-10-CM | POA: Insufficient documentation

## 2018-09-19 DIAGNOSIS — E103553 Type 1 diabetes mellitus with stable proliferative diabetic retinopathy, bilateral: Secondary | ICD-10-CM | POA: Diagnosis not present

## 2018-09-19 DIAGNOSIS — G43709 Chronic migraine without aura, not intractable, without status migrainosus: Secondary | ICD-10-CM | POA: Insufficient documentation

## 2018-09-19 DIAGNOSIS — E1043 Type 1 diabetes mellitus with diabetic autonomic (poly)neuropathy: Secondary | ICD-10-CM

## 2018-09-19 DIAGNOSIS — E1059 Type 1 diabetes mellitus with other circulatory complications: Secondary | ICD-10-CM | POA: Diagnosis not present

## 2018-09-19 DIAGNOSIS — I10 Essential (primary) hypertension: Secondary | ICD-10-CM

## 2018-09-19 DIAGNOSIS — J3089 Other allergic rhinitis: Secondary | ICD-10-CM | POA: Insufficient documentation

## 2018-09-19 DIAGNOSIS — I6930 Unspecified sequelae of cerebral infarction: Secondary | ICD-10-CM

## 2018-09-19 DIAGNOSIS — K3184 Gastroparesis: Secondary | ICD-10-CM

## 2018-09-19 DIAGNOSIS — M8448XA Pathological fracture, other site, initial encounter for fracture: Secondary | ICD-10-CM | POA: Insufficient documentation

## 2018-09-19 DIAGNOSIS — E1069 Type 1 diabetes mellitus with other specified complication: Secondary | ICD-10-CM | POA: Diagnosis not present

## 2018-09-19 DIAGNOSIS — D638 Anemia in other chronic diseases classified elsewhere: Secondary | ICD-10-CM | POA: Insufficient documentation

## 2018-09-19 DIAGNOSIS — I639 Cerebral infarction, unspecified: Secondary | ICD-10-CM | POA: Insufficient documentation

## 2018-09-19 DIAGNOSIS — Z79899 Other long term (current) drug therapy: Secondary | ICD-10-CM | POA: Insufficient documentation

## 2018-09-19 DIAGNOSIS — M8448XS Pathological fracture, other site, sequela: Secondary | ICD-10-CM

## 2018-09-19 LAB — PROTIME-INR
INR: 2.37
Prothrombin Time: 25.6 seconds — ABNORMAL HIGH (ref 11.4–15.2)

## 2018-09-19 MED ORDER — FENTANYL 25 MCG/HR TD PT72: 1.0000 | MEDICATED_PATCH | TRANSDERMAL | 0 refills | Status: DC

## 2018-09-19 MED ORDER — HYDROCODONE-ACETAMINOPHEN 10-325 MG PO TABS: 1.0000 | ORAL_TABLET | ORAL | 0 refills | Status: AC | PRN

## 2018-09-19 NOTE — Progress Notes (Signed)
Location:   [facility] Room Number: 209 A Place of Service:  SNF (31)   CODE STATUS: full code   Allergies  Allergen Reactions  . Codeine Nausea Only and Other (See Comments)    Reaction: stress attacks/ headache Can take Vicodin and Percocet   . Meperidine Nausea Only and Other (See Comments)    Reaction: stress attacks/ headache  . Metoclopramide Hcl Nausea Only and Other (See Comments)    Reaction: stress attacks/ headache  . Oxycodone Nausea Only and Other (See Comments)    Reaction: stress attacks/ headache  . Stadol [Butorphanol] Nausea Only and Other (See Comments)    Reaction: stress attacks/ headache  . Tussionex Pennkinetic Er [Hydrocod Polst-Cpm Polst Er] Other (See Comments)    Filled patient with sugar   . Carbocaine  [Mepivacaine Hcl] Palpitations    Chief Complaint  Patient presents with  . Hospitalization Follow-up    Hospital follow up    HPI:   She is a 73 year old woman who presented to the ED with severe sacral pain. She had a previous x-ray done which demonstrated an impingement. She was treated with pain medications; muscle relaxer; and steroids without relief. She has a history of multiple falls. She was admitted; an MRI demonstrated bilateral sacral fractures. She was treated conservatively. She is here for short term rehab and pain management. She is having uncontrolled pain is having a great difficulty with movement. She is having significant constipation. She denies any nausea or vomiting. She will continue to be followed for her chronic illnesses including: diabetes; dyslipidemia; hypertension.    Past Medical History:  Diagnosis Date  . At high risk for falls   . Basal cell carcinoma    right upper leg  . Complication of anesthesia    difficult to arouse  . Diabetes mellitus without complication (HCC)    Type I  . Diabetic neuropathy ([place])   . Diabetic retinopathy ([place])   . Dizziness   . Gastroparesis   . Herniated disc,  cervical   . Hypercholesteremia   . Hypertension   . Hypotensive episode   . Hypothyroidism   . Insulin pump in place   . Myofascial muscle pain   . Numbness of arm    left  . Osteoporosis   . Poor vision   . Seizures ([place])   . Slurred speech   . 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 Stroke (HCC)    x5  . Vertigo   . Wears glasses     Past Surgical History:  Procedure Laterality Date  . APPENDECTOMY    . AUGMENTATION MAMMAPLASTY Bilateral 2011  . BILATERAL CARPAL TUNNEL RELEASE    . COLONOSCOPY    . EYE SURGERY    . HEMORROIDECTOMY    . HERNIA REPAIR    . JOINT REPLACEMENT    . OPEN REDUCTION INTERNAL FIXATION (ORIF) DISTAL RADIAL FRACTURE Right 05/29/2015   Procedure: OPEN REDUCTION INTERNAL FIXATION (ORIF) RIGHT DISTAL RADIUS FRACTURE WITH ALLOGRAFT BONE GRAFTING FOR REPAIR AND RECONSTRUCTION;  Surgeon: Roseanne Kaufman, MD;  Location: [facility];  Service: Orthopedics;  Laterality: Right;  . SHOULDER ARTHROSCOPY Left   . SHOULDER HEMI-ARTHROPLASTY Left   . SYNOVECTOMY    . TONSILLECTOMY    . TRIGGER FINGER RELEASE    . TUBAL LIGATION      Social History   Socioeconomic History  . Marital status: Married    Spouse name: Not on file  .  Number of children: Not on file  . Years of education: Not on file  . Highest education level: Not on file  Occupational History  . Not on file  Social Needs  . Financial resource strain: Not on file  . Food insecurity:    Worry: Not on file    Inability: Not on file  . Transportation needs:    Medical: Not on file    Non-medical: Not on file  Tobacco Use  . Smoking status: Never Smoker  . Smokeless tobacco: Never Used  Substance and Sexual Activity  . Alcohol use: No  . Drug use: No  . Sexual activity: Not on file  Lifestyle  . Physical activity:    Days per week: Not on file    Minutes per session: Not on file  . Stress: Not on file  Relationships  . Social connections:    Talks on phone: Not on file    Gets together:  Not on file    Attends religious service: Not on file    Active member of club or organization: Not on file    Attends meetings of clubs or organizations: Not on file    Relationship status: Not on file  . Intimate partner violence:    Fear of current or ex partner: Not on file    Emotionally abused: Not on file    Physically abused: Not on file    Forced sexual activity: Not on file  Other Topics Concern  . Not on file  Social History Narrative   Lives at home with her husband.  Unsteady gait.  Walks without any assistive device at this time   Family History  Problem Relation Age of Onset  . Hypertension Mother   . Stroke Mother   . Heart failure Father       VITAL SIGNS BP (!) 130/54   Pulse 93   Temp 98.7 F (37.1 C)   Resp 19   Ht 5' 2"  (1.575 m)   Wt 101 lb 11.2 oz (46.1 kg)   SpO2 96%   BMI 18.60 kg/m   Outpatient Encounter Medications as of 09/19/2018  Medication Sig  . acetaminophen (TYLENOL) 325 MG tablet Take 2 tablets (650 mg total) by mouth every 6 (six) hours as needed for headache. For mild pain  . aspirin EC 325 MG tablet Take 325 mg by mouth daily.  Marland Kitchen atorvastatin (LIPITOR) 40 MG tablet Take 40 mg by mouth daily.  . butalbital-acetaminophen-caffeine (FIORICET, ESGIC) 50-325-40 MG tablet Take 0.5-1 tablets by mouth 2 (two) times daily as needed for headache.  . Calcium Carb-Cholecalciferol (CALCIUM 600 + D) 600-200 MG-UNIT TABS Take 1 tablet by mouth every evening.  . carboxymethylcellulose (REFRESH PLUS) 0.5 % SOLN Place 1 drop into both eyes as needed (for dry eyes).   . cetirizine (ZYRTEC) 10 MG tablet Take 10 mg by mouth daily.  . Cholecalciferol (VITAMIN D-3) 5000 units TABS Take 5,000 Units by mouth daily.  . ferrous sulfate 325 (65 FE) MG tablet Take 325 mg by mouth every evening.  . gabapentin (NEURONTIN) 300 MG capsule Give 3 tablets (900 mg) by mouth at bedtime  . Garlic 1610 MG CAPS Take 1 capsule by mouth every evening.   Marland Kitchen glucose 4 GM  chewable tablet Chew 1 tablet by mouth as needed for low blood sugar.  . insulin aspart (NOVOLOG) 100 UNIT/ML injection Inject 5 Units into the skin 3 (three) times daily with meals for 30 days.  . Insulin  Disposable Pump (OMNIPOD 5 PACK) MISC Inject 1 each into the skin continuous. Uses Novolog. (Pt states she takes 20 units cont through out day.)  . insulin glargine (LANTUS) 100 UNIT/ML injection Inject 0.08 mLs (8 Units total) into the skin daily.  Marland Kitchen losartan (COZAAR) 50 MG tablet Take 50 mg by mouth every evening.   . Multiple Vitamin (MULTIVITAMIN WITH MINERALS) TABS tablet Take 1 tablet by mouth every evening.  . NON FORMULARY Diet Type: NCS  . polyethylene glycol (MIRALAX / GLYCOLAX) packet Take 17 g by mouth every other day.  . promethazine (PHENERGAN) 25 MG suppository Place 25 mg rectally every 6 (six) hours as needed for nausea or vomiting.  . traMADol (ULTRAM) 50 MG tablet Take 1 tablet (50 mg total) by mouth every 6 (six) hours as needed for moderate pain.  Marland Kitchen venlafaxine (EFFEXOR) 75 MG tablet Take 75 mg by mouth 2 (two) times daily.  . vitamin C (ASCORBIC ACID) 500 MG tablet Take 500 mg by mouth 2 (two) times daily.  Marland Kitchen warfarin (COUMADIN) 5 MG tablet Take 7.5 mg by mouth every evening.    No facility-administered encounter medications on file as of 09/19/2018.      SIGNIFICANT DIAGNOSTIC EXAMS  TODAY:   09-15-18: ct of pelvis:  1. Acute appearing bilateral sacral ala fractures. Linear sclerosis within the mid to distal sacral body on the right side, suspicious for subacute sacral fracture. 2. Acute nondisplaced right transverse process fracture  09-16-18: MRI of lumbar spine:  1. Scoliosis and degenerative lumbar spondylosis. 2. No acute lumbar compression fractures. 3. Bilateral sacral fractures. 4. Mild bilateral lateral recess stenosis and left foraminal stenosis at L1-2 and L2-3. 5. Moderate to moderately severe multifactorial spinal, bilateral lateral recess and  foraminal stenosis at L4-5.  LABS REVIEWED TODAY:   05-29-18: hgb a1c 6.9; chol 148; ldl 46; trig 88; hdl 84  09-15-18: wbc 9.7; hgb 12.0; hct 362 mcv 93.3; plt 299; glucose 352; bun 18; creat 0.55; k+ 4.0; na++ 137; ca 8.9 alk phos 176; albumin 3.7  09-18-18: wbc 7.8; hgb 11.7; hct 35.5; mcv 93.4 ;plt 280  INR 2.79  Review of Systems  Constitutional: Negative for malaise/fatigue.  Respiratory: Negative for cough and shortness of breath.   Cardiovascular: Negative for chest pain, palpitations and leg swelling.  Gastrointestinal: Positive for constipation. Negative for abdominal pain and heartburn.  Musculoskeletal: Positive for back pain and joint pain. Negative for myalgias.       Tailbone pain   Skin: Negative.   Neurological: Negative for dizziness.  Psychiatric/Behavioral: The patient is not nervous/anxious.    Physical Exam Constitutional:      General: She is not in acute distress.    Appearance: She is well-developed. She is not diaphoretic.     Comments: Frail   Eyes:     Comments: Poor vision bilateral   Neck:     Musculoskeletal: Neck supple.     Thyroid: No thyromegaly.  Cardiovascular:     Rate and Rhythm: Normal rate and regular rhythm.     Pulses: Normal pulses.     Heart sounds: Normal heart sounds.  Pulmonary:     Effort: Pulmonary effort is normal. No respiratory distress.     Breath sounds: Normal breath sounds.  Abdominal:     General: Bowel sounds are normal. There is no distension.     Palpations: Abdomen is soft.     Tenderness: There is no abdominal tenderness.  Genitourinary:    Comments: Has foley  clear yellow urine  Musculoskeletal:     Right lower leg: No edema.     Left lower leg: No edema.     Comments: Is able to move all extremities  History of right radial ORIF   Lymphadenopathy:     Cervical: No cervical adenopathy.  Skin:    General: Skin is warm and dry.     Comments: History of bilateral breast implants  Neurological:     Mental  Status: She is alert and oriented to person, place, and time.  Psychiatric:        Mood and Affect: Mood normal.        ASSESSMENT/ PLAN:  TODAY:   1. Hypertension associated with type 1 diabetes mellitus: is stable b/p 130/54: will continue asa 325 mg daily and cozar 50 mg daily   2. Diabetic polyneuropathy associated with type 1 diabetes mellitus: is stable will continue gabapentin 300 mg twice daily and 900 mg nightly   3. Dyslipidemia due to type 1 diabetes mellitus: is stable LDL 46 will continue lipitor 40 mg daily   4. 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 Uses pump at home. Will change her lantus to 12 units nightly and will change novolog to SSI ac and hs: 0-100: 0 units; 101-120: 1 units; 121-150: 2 units; 151-200: 4 units; 201-250: 6 units; 251-300: 8 units; 301-350: 10 units; 351-400: 12 units; 401-450: 15 units: >450 call MD   5. Anemia of chronic disease: is stable hgb 11.7 will continue iron daily   6. Chronic migraine without aura: is stable will Fioricet 1/2 to 1 tab twice daily as needed  7.  Chronic non-seasonal allergic rhinitis: is stable will continue zyrtec 10 mg daily   8. Major depression recurrent chronic: is stable will continue effexor 75 mg twice daily   9. Diabetic gastroparesis associated with type 1 diabetes mellitus: is having constipation; will change her miralax to daily and will give her a fleets enema X 1  10. Chronic cerebrovascular accident: is stable is on chronic coumadin therapy 7.5 mg daily INR is pending.   11.  Bilateral sacral fracture: will continue therapy as directed and will follow up with orthopedics as indicated will continue fentanyl 25 mcg patch every 3 days; will begin vicodin 10/325 mg every 4 hours as needed; will stop ultram   12.  Chronic pain syndrome with degenerative lumbar spondylosis: is stable will continue her fentanyl 25 mcg patch every 3 days.   13. Urine  retention: is stable has foley will monitor    MD is aware of resident's narcotic use and is in agreement with current plan of care. We will attempt to wean resident as apropriate   Ok Edwards NP [facility]  Contact [phone] Monday through Friday 8am- 5pm  After hours call [phone]

## 2018-09-19 NOTE — Progress Notes (Signed)
Location:   [facility] Room Number: [place] of Service:  SNF (31)   CODE STATUS: Full Code  Allergies  Allergen Reactions  . Codeine Nausea Only and Other (See Comments)    Reaction: stress attacks/ headache Can take Vicodin and Percocet   . Meperidine Nausea Only and Other (See Comments)    Reaction: stress attacks/ headache  . Metoclopramide Hcl Nausea Only and Other (See Comments)    Reaction: stress attacks/ headache  . Oxycodone Nausea Only and Other (See Comments)    Reaction: stress attacks/ headache  . Stadol [Butorphanol] Nausea Only and Other (See Comments)    Reaction: stress attacks/ headache  . Tussionex Pennkinetic Er [Hydrocod Polst-Cpm Polst Er] Other (See Comments)    Filled patient with sugar   . Carbocaine  [Mepivacaine Hcl] Palpitations    Chief Complaint  Patient presents with  . Hospitalization Follow-up    Hospital follow up    HPI:    Past Medical History:  Diagnosis Date  . At high risk for falls   . Basal cell carcinoma    right upper leg  . Complication of anesthesia    difficult to arouse  . Diabetes mellitus without complication (HCC)    Type I  . Diabetic neuropathy ([place])   . Diabetic retinopathy ([place])   . Dizziness   . Gastroparesis   . Herniated disc, cervical   . Hypercholesteremia   . Hypertension   . Hypotensive episode   . Hypothyroidism   . Insulin pump in place   . Myofascial muscle pain   . Numbness of arm    left  . Osteoporosis   . Poor vision   . Seizures ([place])   . Slurred speech   . 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 Stroke (HCC)    x5  . Vertigo   . Wears glasses     Past Surgical History:  Procedure Laterality Date  . APPENDECTOMY    . AUGMENTATION MAMMAPLASTY Bilateral 2011  . BILATERAL CARPAL TUNNEL RELEASE    . COLONOSCOPY    . EYE SURGERY    . HEMORROIDECTOMY    . HERNIA REPAIR    . JOINT REPLACEMENT    . OPEN REDUCTION INTERNAL FIXATION (ORIF) DISTAL RADIAL  FRACTURE Right 05/29/2015   Procedure: OPEN REDUCTION INTERNAL FIXATION (ORIF) RIGHT DISTAL RADIUS FRACTURE WITH ALLOGRAFT BONE GRAFTING FOR REPAIR AND RECONSTRUCTION;  Surgeon: Roseanne Kaufman, MD;  Location: [facility];  Service: Orthopedics;  Laterality: Right;  . SHOULDER ARTHROSCOPY Left   . SHOULDER HEMI-ARTHROPLASTY Left   . SYNOVECTOMY    . TONSILLECTOMY    . TRIGGER FINGER RELEASE    . TUBAL LIGATION      Social History   Socioeconomic History  . Marital status: Married    Spouse name: Not on file  . Number of children: Not on file  . Years of education: Not on file  . Highest education level: Not on file  Occupational History  . Not on file  Social Needs  . Financial resource strain: Not on file  . Food insecurity:    Worry: Not on file    Inability: Not on file  . Transportation needs:    Medical: Not on file    Non-medical: Not on file  Tobacco Use  . Smoking status: Never Smoker  . Smokeless tobacco: Never Used  Substance and Sexual Activity  . Alcohol use: No  . Drug use: No  .  Sexual activity: Not on file  Lifestyle  . Physical activity:    Days per week: Not on file    Minutes per session: Not on file  . Stress: Not on file  Relationships  . Social connections:    Talks on phone: Not on file    Gets together: Not on file    Attends religious service: Not on file    Active member of club or organization: Not on file    Attends meetings of clubs or organizations: Not on file    Relationship status: Not on file  . Intimate partner violence:    Fear of current or ex partner: Not on file    Emotionally abused: Not on file    Physically abused: Not on file    Forced sexual activity: Not on file  Other Topics Concern  . Not on file  Social History Narrative   Lives at home with her husband.  Unsteady gait.  Walks without any assistive device at this time   Family History  Problem Relation Age of Onset  . Hypertension Mother   . Stroke Mother   . Heart  failure Father       VITAL SIGNS BP (!) 130/54   Pulse 93   Temp 98.7 F (37.1 C)   Resp 19   Ht 5\' 2"  (1.575 m)   Wt 101 lb 11.2 oz (46.1 kg)   SpO2 96%   BMI 18.60 kg/m   Outpatient Encounter Medications as of 09/19/2018  Medication Sig  . acetaminophen (TYLENOL) 325 MG tablet Take 2 tablets (650 mg total) by mouth every 6 (six) hours as needed for headache. For mild pain  . aspirin EC 325 MG tablet Take 325 mg by mouth daily.  Marland Kitchen atorvastatin (LIPITOR) 40 MG tablet Take 40 mg by mouth daily.  . butalbital-acetaminophen-caffeine (FIORICET, ESGIC) 50-325-40 MG tablet Take 0.5-1 tablets by mouth 2 (two) times daily as needed for headache.  . Calcium Carb-Cholecalciferol (CALCIUM 600 + D) 600-200 MG-UNIT TABS Take 1 tablet by mouth every evening.  . carboxymethylcellulose (REFRESH PLUS) 0.5 % SOLN Place 1 drop into both eyes as needed (for dry eyes).   . cetirizine (ZYRTEC) 10 MG tablet Take 10 mg by mouth daily.  . Cholecalciferol (VITAMIN D-3) 5000 units TABS Take 5,000 Units by mouth daily.  . ferrous sulfate 325 (65 FE) MG tablet Take 325 mg by mouth every evening.  . gabapentin (NEURONTIN) 300 MG capsule Give 3 tablets (900 mg) by mouth at bedtime  . Garlic 0258 MG CAPS Take 1 capsule by mouth every evening.   Marland Kitchen glucose 4 GM chewable tablet Chew 1 tablet by mouth as needed for low blood sugar.  . HYDROcodone-acetaminophen (NORCO) 10-325 MG tablet Take 1 tablet by mouth every 4 (four) hours as needed for up to 7 days.  . insulin aspart (NOVOLOG) 100 UNIT/ML injection Inject 5 Units into the skin 3 (three) times daily with meals for 30 days.  . Insulin Disposable Pump (OMNIPOD 5 PACK) MISC Inject 1 each into the skin continuous. Uses Novolog. (Pt states she takes 20 units cont through out day.)  . insulin glargine (LANTUS) 100 UNIT/ML injection Inject 0.08 mLs (8 Units total) into the skin daily.  Marland Kitchen losartan (COZAAR) 50 MG tablet Take 50 mg by mouth every evening.   . Multiple  Vitamin (MULTIVITAMIN WITH MINERALS) TABS tablet Take 1 tablet by mouth every evening.  . NON FORMULARY Diet Type: NCS  . polyethylene glycol (MIRALAX /  GLYCOLAX) packet Take 17 g by mouth every other day.  . promethazine (PHENERGAN) 25 MG suppository Place 25 mg rectally every 6 (six) hours as needed for nausea or vomiting.  . traMADol (ULTRAM) 50 MG tablet Take 1 tablet (50 mg total) by mouth every 6 (six) hours as needed for moderate pain.  Marland Kitchen venlafaxine (EFFEXOR) 75 MG tablet Take 75 mg by mouth 2 (two) times daily.  . vitamin C (ASCORBIC ACID) 500 MG tablet Take 500 mg by mouth 2 (two) times daily.  Marland Kitchen warfarin (COUMADIN) 5 MG tablet Take 7.5 mg by mouth every evening.   . [DISCONTINUED] fentaNYL (DURAGESIC) 25 MCG/HR Place 1 patch onto the skin every 3 (three) days.  . [DISCONTINUED] atorvastatin (LIPITOR) 40 MG tablet Take 1 tablet (40 mg total) by mouth daily at 6 PM. (Patient not taking: Reported on 09/19/2018)  . [DISCONTINUED] gabapentin (NEURONTIN) 300 MG capsule 1 in the am, 1 at lunch and 3 at bedtime (Patient not taking: Reported on 09/19/2018)  . [DISCONTINUED] Polyethyl Glycol-Propyl Glycol (SYSTANE) 0.4-0.3 % SOLN Apply 1 drop to eye as needed (for dry eyes).    No facility-administered encounter medications on file as of 09/19/2018.      SIGNIFICANT DIAGNOSTIC EXAMS       ASSESSMENT/ PLAN:    MD is aware of resident's narcotic use and is in agreement with current plan of care. We will attempt to wean resident as apropriate   Ok Edwards NP [facility]  Contact [phone] Monday through Friday 8am- 5pm  After hours call [phone]

## 2018-09-21 ENCOUNTER — Encounter
Admission: RE | Admit: 2018-09-21 | Discharge: 2018-09-21 | Disposition: A | Discharge status: Home / Self Care | Payer: Medicare Other | Source: Ambulatory Visit | Attending: Internal Medicine | Admitting: Internal Medicine

## 2018-09-26 ENCOUNTER — Encounter: Payer: Self-pay | Admitting: Adult Health

## 2018-09-26 ENCOUNTER — Non-Acute Institutional Stay (SKILLED_NURSING_FACILITY): Payer: Medicare Other | Admitting: Adult Health

## 2018-09-26 DIAGNOSIS — E1059 Type 1 diabetes mellitus with other circulatory complications: Secondary | ICD-10-CM | POA: Diagnosis not present

## 2018-09-26 DIAGNOSIS — F419 Anxiety disorder, unspecified: Secondary | ICD-10-CM

## 2018-09-26 DIAGNOSIS — E103553 Type 1 diabetes mellitus with stable proliferative diabetic retinopathy, bilateral: Secondary | ICD-10-CM

## 2018-09-26 DIAGNOSIS — E1042 Type 1 diabetes mellitus with diabetic polyneuropathy: Secondary | ICD-10-CM

## 2018-09-26 DIAGNOSIS — E785 Hyperlipidemia, unspecified: Secondary | ICD-10-CM

## 2018-09-26 DIAGNOSIS — E1069 Type 1 diabetes mellitus with other specified complication: Secondary | ICD-10-CM | POA: Diagnosis not present

## 2018-09-26 DIAGNOSIS — I10 Essential (primary) hypertension: Secondary | ICD-10-CM

## 2018-09-26 NOTE — Progress Notes (Signed)
Location:   [place] [facility] Room Number: [place] of Service:  SNF (31)   CODE STATUS: DNR  Allergies  Allergen Reactions  . Codeine Nausea Only and Other (See Comments)    Reaction: stress attacks/ headache Can take Vicodin and Percocet   . Meperidine Nausea Only and Other (See Comments)    Reaction: stress attacks/ headache  . Metoclopramide Hcl Nausea Only and Other (See Comments)    Reaction: stress attacks/ headache  . Oxycodone Nausea Only and Other (See Comments)    Reaction: stress attacks/ headache  . Stadol [Butorphanol] Nausea Only and Other (See Comments)    Reaction: stress attacks/ headache  . Tussionex Pennkinetic Er [Hydrocod Polst-Cpm Polst Er] Other (See Comments)    Filled patient with sugar   . Carbocaine  [Mepivacaine Hcl] Palpitations    Chief Complaint  Patient presents with  . 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 Weekly follow up for the first 30 days post hospitalization. Care plan meeting.     HPI:  She is a 73 year old short term rehab patient being seen for the management of her chronic illnesses; hypertension; diabetes; neuropathy; dyslipidemia. She continues to have pain; we have reviewed her pain medication; will extend the time of her pain medications to every 6 hours as needed. No complaints of constipation; no changes in appetite;Marland Kitchen She is having anxiety associated with her fall; which is making her pain worse. She continue to participate in therapy. She has ambulated 15 feet with a walker in therapy. Her goal remains for her to return back home.   Past Medical History:  Diagnosis Date  . At high risk for falls   . Basal cell carcinoma    right upper leg  . Complication of  anesthesia    difficult to arouse  . Diabetes mellitus without complication (HCC)    Type I  . Diabetic neuropathy ([place])   . Diabetic retinopathy ([place])   . Dizziness   . Gastroparesis   . Herniated disc, cervical   . Hypercholesteremia   . Hypertension   . Hypotensive episode   . Hypothyroidism   . Insulin pump in place   . Myofascial muscle pain   . Numbness of arm    left  . Osteoporosis   . Poor vision   . Seizures ([place])   . Slurred speech   . Stroke (cerebrum) ([place])    4/19  . Stroke (HCC)    x5  . Vertigo   . Wears glasses     Past Surgical History:  Procedure Laterality Date  . APPENDECTOMY    . AUGMENTATION MAMMAPLASTY Bilateral 2011  . BILATERAL CARPAL TUNNEL RELEASE    . COLONOSCOPY    . EYE SURGERY    . HEMORROIDECTOMY    . HERNIA REPAIR    . JOINT REPLACEMENT    . OPEN REDUCTION INTERNAL FIXATION (ORIF) DISTAL RADIAL FRACTURE Right 05/29/2015   Procedure: OPEN REDUCTION INTERNAL FIXATION (ORIF) RIGHT DISTAL RADIUS FRACTURE WITH ALLOGRAFT BONE GRAFTING FOR REPAIR AND RECONSTRUCTION;  Surgeon: Roseanne Kaufman, MD;  Location: [place];  Service: Orthopedics;  Laterality: Right;  . SHOULDER ARTHROSCOPY Left   . SHOULDER HEMI-ARTHROPLASTY Left   . SYNOVECTOMY    . TONSILLECTOMY    . TRIGGER  FINGER RELEASE    . TUBAL LIGATION      Social History   Socioeconomic History  . Marital status: Married    Spouse name: Not on file  . Number of children: Not on file  . Years of education: Not on file  . Highest education level: Not on file  Occupational History  . Not on file  Social Needs  . Financial resource strain: Not on file  . Food insecurity:    Worry: Not on file    Inability: Not on file  . Transportation needs:    Medical: Not on file    Non-medical: Not on file  Tobacco Use  . Smoking status: Never Smoker  . Smokeless tobacco: Never Used  Substance and Sexual Activity  . Alcohol use: No  . Drug use: No  . Sexual activity: Not on file    Lifestyle  . Physical activity:    Days per week: Not on file    Minutes per session: Not on file  . Stress: Not on file  Relationships  . Social connections:    Talks on phone: Not on file    Gets together: Not on file    Attends religious service: Not on file    Active member of club or organization: Not on file    Attends meetings of clubs or organizations: Not on file    Relationship status: Not on file  . Intimate partner violence:    Fear of current or ex partner: Not on file    Emotionally abused: Not on file    Physically abused: Not on file    Forced sexual activity: Not on file  Other Topics Concern  . Not on file  Social History Narrative   Lives at home with her husband.  Unsteady gait.  Walks without any assistive device at this time   Family History  Problem Relation Age of Onset  . Hypertension Mother   . Stroke Mother   . Heart failure Father       VITAL SIGNS BP (!) 113/45   Pulse 83   Temp 98.9 F (37.2 C)   Resp 20   Ht 5' 2"  (1.575 m)   Wt 100 lb 9.6 oz (45.6 kg)   SpO2 97%   BMI 18.40 kg/m   Outpatient Encounter Medications as of 09/26/2018  Medication Sig  . acetaminophen (TYLENOL) 325 MG tablet Take 2 tablets (650 mg total) by mouth every 6 (six) hours as needed for headache. For mild pain  . aspirin EC 325 MG tablet Take 325 mg by mouth 2 (two) times daily.   Marland Kitchen atorvastatin (LIPITOR) 40 MG tablet Take 40 mg by mouth daily.  . butalbital-acetaminophen-caffeine (FIORICET, ESGIC) 50-325-40 MG tablet Take 0.5-1 tablets by mouth 2 (two) times daily as needed for headache.  . Calcium Carb-Cholecalciferol (CALCIUM 600 + D) 600-200 MG-UNIT TABS Take 1 tablet by mouth every evening.  . carboxymethylcellulose (REFRESH PLUS) 0.5 % SOLN Place 1 drop into both eyes as needed (for dry eyes).   . cetirizine (ZYRTEC) 10 MG tablet Take 10 mg by mouth daily.  . Cholecalciferol (VITAMIN D-3) 5000 units TABS Take 5,000 Units by mouth daily.  . fentaNYL  (DURAGESIC) 25 MCG/HR Place 1 patch onto the skin every 3 (three) days.  Marland Kitchen gabapentin (NEURONTIN) 300 MG capsule Give 3 tablets (900 mg) by mouth at bedtime  . gabapentin (NEURONTIN) 300 MG capsule Take 300 mg by mouth 2 (two) times daily.  . Garlic  1000 MG CAPS Take 1 capsule by mouth every evening.   Marland Kitchen glucose 4 GM chewable tablet Chew 1 tablet by mouth as needed for low blood sugar.  . HYDROcodone-acetaminophen (NORCO) 10-325 MG tablet Take 1 tablet by mouth every 4 (four) hours as needed for up to 7 days.  . insulin aspart (NOVOLOG FLEXPEN) 100 UNIT/ML FlexPen Inject 0-15 units subcutaneous per sliding scale before meals and at bedtime 0 - 100 units = 0 units 101 - 120 = 1 unit 121 - 150 = 2 units 151 - 200 = 4 units 201 - 250 = 6 units 251 - 300 = 8 units 301 - 350 = 10 units 351 - 400 = 12 units 401 - 450 = 15 units > 450 call MD  . insulin aspart (NOVOLOG FLEXPEN) 100 UNIT/ML FlexPen Inject subcutaneous per sliding scale @@ 3 am 0 - 100 = 0 units 101 - 120 = 1 unit 121 - 150 = 2 units 151 - 200 = 4 units 201 - 250 = 6 units 251 - 300 = 8 units 301 - 350 = 10 units 351 - 400 = 12 units  . Insulin Disposable Pump (OMNIPOD 5 PACK) MISC Inject 1 each into the skin continuous. Uses Novolog. (Pt states she takes 20 units cont through out day.)  . Insulin Glargine (LANTUS SOLOSTAR) 100 UNIT/ML Solostar Pen Inject 12 Units into the skin at bedtime.  Marland Kitchen linaclotide (LINZESS) 72 MCG capsule Take 72 mcg by mouth daily.  Marland Kitchen losartan (COZAAR) 50 MG tablet Take 50 mg by mouth every evening.   . Multiple Vitamins-Minerals (CEROVITE ADVANCED FORMULA PO) Take 1 tablet by mouth daily.  . NON FORMULARY Diet Type: NCS  . polyethylene glycol (MIRALAX / GLYCOLAX) packet Take 17 g by mouth every other day.  . promethazine (PHENERGAN) 25 MG suppository Place 25 mg rectally every 6 (six) hours as needed for nausea or vomiting.  . senna (SENOKOT) 8.6 MG TABS tablet Take 2 tablets by mouth 2 (two) times  daily.  . traMADol (ULTRAM) 50 MG tablet Take 1 tablet (50 mg total) by mouth every 6 (six) hours as needed for moderate pain.  Marland Kitchen venlafaxine (EFFEXOR) 75 MG tablet Take 75 mg by mouth 2 (two) times daily.  . vitamin C (ASCORBIC ACID) 500 MG tablet Take 500 mg by mouth 2 (two) times daily.  Marland Kitchen warfarin (COUMADIN) 5 MG tablet Take 7.5 mg by mouth every evening.   . ferrous sulfate 325 (65 FE) MG tablet Take 325 mg by mouth every evening.  . [DISCONTINUED] insulin aspart (NOVOLOG) 100 UNIT/ML injection Inject 5 Units into the skin 3 (three) times daily with meals for 30 days. (Patient not taking: Reported on 09/26/2018)  . [DISCONTINUED] insulin glargine (LANTUS) 100 UNIT/ML injection Inject 0.08 mLs (8 Units total) into the skin daily. (Patient not taking: Reported on 09/26/2018)  . [DISCONTINUED] Multiple Vitamin (MULTIVITAMIN WITH MINERALS) TABS tablet Take 1 tablet by mouth every evening.   No facility-administered encounter medications on file as of 09/26/2018.      SIGNIFICANT DIAGNOSTIC EXAMS  PREVIOUS:   09-15-18: ct of pelvis:  1. Acute appearing bilateral sacral ala fractures. Linear sclerosis within the mid to distal sacral body on the right side, suspicious for subacute sacral fracture. 2. Acute nondisplaced right transverse process fracture  09-16-18: MRI of lumbar spine:  1. Scoliosis and degenerative lumbar spondylosis. 2. No acute lumbar compression fractures. 3. Bilateral sacral fractures. 4. Mild bilateral lateral recess stenosis and left foraminal  stenosis at L1-2 and L2-3. 5. Moderate to moderately severe multifactorial spinal, bilateral lateral recess and foraminal stenosis at L4-5.  NO NEW EXAMS.   LABS REVIEWED PREVIOUS:   05-29-18: hgb a1c 6.9; chol 148; ldl 46; trig 88; hdl 84  09-15-18: wbc 9.7; hgb 12.0; hct 362 mcv 93.3; plt 299; glucose 352; bun 18; creat 0.55; k+ 4.0; na++ 137; ca 8.9 alk phos 176; albumin 3.7  09-18-18: wbc 7.8; hgb 11.7; hct 35.5; mcv 93.4 ;plt  280  INR 2.79  NO NEW LABS.   Review of Systems  Constitutional: Negative for malaise/fatigue.  Respiratory: Negative for cough and shortness of breath.   Cardiovascular: Negative for chest pain, palpitations and leg swelling.  Gastrointestinal: Negative for abdominal pain, constipation and heartburn.  Musculoskeletal: Positive for joint pain. Negative for back pain and myalgias.       Tail bone pain   Skin: Negative.   Neurological: Negative for dizziness.  Psychiatric/Behavioral: The patient is not nervous/anxious.    Physical Exam Constitutional:      General: She is not in acute distress.    Appearance: She is well-developed. She is not diaphoretic.     Comments: Frail   Eyes:     Comments: Poor vision bilateral   Neck:     Thyroid: No thyromegaly.  Cardiovascular:     Rate and Rhythm: Normal rate and regular rhythm.     Heart sounds: Normal heart sounds.  Pulmonary:     Effort: Pulmonary effort is normal. No respiratory distress.     Breath sounds: Normal breath sounds.  Abdominal:     General: Bowel sounds are normal. There is no distension.     Palpations: Abdomen is soft.     Tenderness: There is no abdominal tenderness.  Musculoskeletal:     Right lower leg: No edema.     Left lower leg: No edema.     Comments: Is able to move all extremities  History of right radial ORIF    Lymphadenopathy:     Cervical: No cervical adenopathy.  Skin:    General: Skin is warm and dry.     Comments: History of bilateral breast implants   Neurological:     Mental Status: She is alert and oriented to person, place, and time.  Psychiatric:        Mood and Affect: Mood normal.      ASSESSMENT/ PLAN:  TODAY:   1. Hypertension associated with type 1 diabetes mellitus: is stable b/p 113/45: will continue asa 325 mg daily and cozar 50 mg daily   2. Diabetic polyneuropathy associated with type 1 diabetes mellitus: is stable will continue gabapentin 300 mg twice daily and 900  mg nightly   3. Dyslipidemia due to type 1 diabetes mellitus: is stable LDL 46 will continue lipitor 40 mg daily   4. 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 Uses pump at home. Will continue lantus to 12 units nightly and  novolog  SSI ac and hs and 3 AM : 0-100: 0 units; 101-120: 1 units; 121-150: 2 units; 151-200: 4 units; 201-250: 6 units; 251-300: 8 units; 301-350: 10 units; 351-400: 12 units; 401-450: 15 units: >450 call MD   4. Acute anxiety: is worse will begin ativan 0.5 mg every 6 hours as needed for 14 days.    PREVIOUS   5. Anemia of chronic disease: is stable hgb 11.7 will continue iron daily   6. Chronic migraine  without aura: is stable will Fioricet 1/2 to 1 tab twice daily as needed  7.  Chronic non-seasonal allergic rhinitis: is stable will continue zyrtec 10 mg daily   8. Major depression recurrent chronic: is stable will continue effexor 75 mg twice daily   9. Diabetic gastroparesis associated with type 1 diabetes mellitus: is stable will continue miralax  Daily  10. Chronic cerebrovascular accident: is stable is on chronic coumadin therapy 7.5 mg daily INR is pending.   11.  Bilateral sacral fracture: will continue therapy as directed and will follow up with orthopedics as indicated will continue fentanyl 25 mcg patch every 3 days; will begin vicodin 10/325 mg every 6 hours as needed; will stop ultram   12.  Chronic pain syndrome with degenerative lumbar spondylosis: is stable will continue her fentanyl 25 mcg patch every 3 days.    Will continue therapy as directed her goal remains to return back home.      MD is aware of resident's narcotic use and is in agreement with current plan of care. We will attempt to wean resident as apropriate   Ok Edwards NP [facility]  Contact [phone] Monday through Friday 8am- 5pm  After hours call [phone]

## 2018-09-29 ENCOUNTER — Other Ambulatory Visit: Payer: Self-pay

## 2018-09-29 ENCOUNTER — Inpatient Hospital Stay
Admission: EM | Admit: 2018-09-29 | Discharge: 2018-09-30 | DRG: 813 | Disposition: A | Discharge status: Skilled Nursing Facility | Payer: Medicare Other | Source: Skilled Nursing Facility | Attending: Internal Medicine | Admitting: Internal Medicine

## 2018-09-29 DIAGNOSIS — F419 Anxiety disorder, unspecified: Secondary | ICD-10-CM | POA: Insufficient documentation

## 2018-09-29 DIAGNOSIS — R58 Hemorrhage, not elsewhere classified: Secondary | ICD-10-CM

## 2018-09-29 DIAGNOSIS — Z8249 Family history of ischemic heart disease and other diseases of the circulatory system: Secondary | ICD-10-CM

## 2018-09-29 DIAGNOSIS — Z8673 Personal history of transient ischemic attack (TIA), and cerebral infarction without residual deficits: Secondary | ICD-10-CM

## 2018-09-29 DIAGNOSIS — Z79891 Long term (current) use of opiate analgesic: Secondary | ICD-10-CM | POA: Diagnosis not present

## 2018-09-29 DIAGNOSIS — Z794 Long term (current) use of insulin: Secondary | ICD-10-CM | POA: Diagnosis not present

## 2018-09-29 DIAGNOSIS — R040 Epistaxis: Secondary | ICD-10-CM | POA: Diagnosis present

## 2018-09-29 DIAGNOSIS — D6832 Hemorrhagic disorder due to extrinsic circulating anticoagulants: Principal | ICD-10-CM | POA: Diagnosis present

## 2018-09-29 DIAGNOSIS — E785 Hyperlipidemia, unspecified: Secondary | ICD-10-CM | POA: Diagnosis present

## 2018-09-29 DIAGNOSIS — Z7901 Long term (current) use of anticoagulants: Secondary | ICD-10-CM

## 2018-09-29 DIAGNOSIS — M810 Age-related osteoporosis without current pathological fracture: Secondary | ICD-10-CM | POA: Diagnosis present

## 2018-09-29 DIAGNOSIS — Z66 Do not resuscitate: Secondary | ICD-10-CM | POA: Diagnosis present

## 2018-09-29 DIAGNOSIS — K625 Hemorrhage of anus and rectum: Secondary | ICD-10-CM

## 2018-09-29 DIAGNOSIS — T45515A Adverse effect of anticoagulants, initial encounter: Secondary | ICD-10-CM | POA: Diagnosis present

## 2018-09-29 DIAGNOSIS — I10 Essential (primary) hypertension: Secondary | ICD-10-CM | POA: Diagnosis present

## 2018-09-29 DIAGNOSIS — E11649 Type 2 diabetes mellitus with hypoglycemia without coma: Secondary | ICD-10-CM | POA: Diagnosis not present

## 2018-09-29 DIAGNOSIS — Z823 Family history of stroke: Secondary | ICD-10-CM

## 2018-09-29 DIAGNOSIS — Z7982 Long term (current) use of aspirin: Secondary | ICD-10-CM | POA: Diagnosis not present

## 2018-09-29 DIAGNOSIS — D6859 Other primary thrombophilia: Secondary | ICD-10-CM | POA: Diagnosis present

## 2018-09-29 LAB — URINALYSIS, COMPLETE (UACMP) WITH MICROSCOPIC
Bilirubin Urine: NEGATIVE
Glucose, UA: NEGATIVE mg/dL
Ketones, ur: NEGATIVE mg/dL
Nitrite: POSITIVE — AB
PROTEIN: 100 mg/dL — AB
RBC / HPF: >50 RBC/hpf — ABNORMAL HIGH (ref 0–5)
Specific Gravity, Urine: 1.006 (ref 1.005–1.030)
Squamous Epithelial / HPF: NONE SEEN (ref 0–5)
WBC, UA: >50 WBC/hpf — ABNORMAL HIGH (ref 0–5)
pH: 7 (ref 5.0–8.0)

## 2018-09-29 LAB — CBC WITH DIFFERENTIAL/PLATELET
Abs Immature Granulocytes: 0.15 10*3/uL — ABNORMAL HIGH (ref 0.00–0.07)
Basophils Absolute: 0 10*3/uL (ref 0.0–0.1)
Basophils Relative: 0 %
Eosinophils Absolute: 0.4 10*3/uL (ref 0.0–0.5)
Eosinophils Relative: 4 %
HCT: 33.2 % — ABNORMAL LOW (ref 36.0–46.0)
Hemoglobin: 10.7 g/dL — ABNORMAL LOW (ref 12.0–15.0)
IMMATURE GRANULOCYTES: 2 %
Lymphocytes Relative: 15 %
Lymphs Abs: 1.4 10*3/uL (ref 0.7–4.0)
MCH: 30.6 pg (ref 26.0–34.0)
MCHC: 32.2 g/dL (ref 30.0–36.0)
MCV: 94.9 fL (ref 80.0–100.0)
Monocytes Absolute: 1 10*3/uL (ref 0.1–1.0)
Monocytes Relative: 10 %
NEUTROS PCT: 69 %
Neutro Abs: 6.6 10*3/uL (ref 1.7–7.7)
Platelets: 378 10*3/uL (ref 150–400)
RBC: 3.5 MIL/uL — ABNORMAL LOW (ref 3.87–5.11)
RDW: 12 % (ref 11.5–15.5)
WBC: 9.7 10*3/uL (ref 4.0–10.5)
nRBC: 0 % (ref 0.0–0.2)

## 2018-09-29 LAB — COMPREHENSIVE METABOLIC PANEL
ALK PHOS: 152 U/L — AB (ref 38–126)
ALT: 28 U/L (ref 0–44)
ANION GAP: 6 (ref 5–15)
AST: 46 U/L — ABNORMAL HIGH (ref 15–41)
Albumin: 3 g/dL — ABNORMAL LOW (ref 3.5–5.0)
BILIRUBIN TOTAL: 0.5 mg/dL (ref 0.3–1.2)
BUN: 17 mg/dL (ref 8–23)
CALCIUM: 8.7 mg/dL — AB (ref 8.9–10.3)
CO2: 31 mmol/L (ref 22–32)
Chloride: 99 mmol/L (ref 98–111)
Creatinine, Ser: 0.5 mg/dL (ref 0.44–1.00)
GFR calc Af Amer: >60 mL/min (ref >60)
GFR calc non Af Amer: >60 mL/min (ref >60)
Glucose, Bld: 99 mg/dL (ref 70–99)
Potassium: 3.9 mmol/L (ref 3.5–5.1)
SODIUM: 136 mmol/L (ref 135–145)
TOTAL PROTEIN: 6.7 g/dL (ref 6.5–8.1)

## 2018-09-29 LAB — PROTIME-INR
INR: >10
Prothrombin Time: >90 seconds — ABNORMAL HIGH (ref 11.4–15.2)

## 2018-09-29 LAB — GLUCOSE, CAPILLARY
Glucose-Capillary: 262 mg/dL — ABNORMAL HIGH (ref 70–99)
Glucose-Capillary: 219 mg/dL — ABNORMAL HIGH (ref 70–99)

## 2018-09-29 LAB — HEMOGLOBIN: Hemoglobin: 10.2 g/dL — ABNORMAL LOW (ref 12.0–15.0)

## 2018-09-29 MED ORDER — GABAPENTIN 300 MG PO CAPS
300.0000 mg | ORAL_CAPSULE | Freq: Two times a day (BID) | ORAL | Status: DC
  Administered 2018-09-29 – 2018-09-30 (×2): 300 mg via ORAL
  Filled 2018-09-29 (×2): qty 1

## 2018-09-29 MED ORDER — VITAMIN D 25 MCG (1000 UNIT) PO TABS
1000.0000 [IU] | ORAL_TABLET | Freq: Every day | ORAL | Status: DC
  Administered 2018-09-29 – 2018-09-30 (×2): 1000 [IU] via ORAL
  Filled 2018-09-29 (×2): qty 1

## 2018-09-29 MED ORDER — FENTANYL 25 MCG/HR TD PT72: 1.0000 | MEDICATED_PATCH | TRANSDERMAL | Status: DC

## 2018-09-29 MED ORDER — INSULIN ASPART 100 UNIT/ML ~~LOC~~ SOLN
0.0000 [IU] | Freq: Every day | SUBCUTANEOUS | Status: DC
  Administered 2018-09-29: 2 [IU] via SUBCUTANEOUS
  Filled 2018-09-29: qty 1

## 2018-09-29 MED ORDER — GABAPENTIN 300 MG PO CAPS
900.0000 mg | ORAL_CAPSULE | Freq: Every day | ORAL | Status: DC
  Administered 2018-09-29: 900 mg via ORAL
  Filled 2018-09-29: qty 3

## 2018-09-29 MED ORDER — LORAZEPAM 0.5 MG PO TABS: 0.5000 mg | ORAL_TABLET | Freq: Four times a day (QID) | ORAL | Status: DC | PRN

## 2018-09-29 MED ORDER — INSULIN GLARGINE 100 UNIT/ML ~~LOC~~ SOLN
12.0000 [IU] | Freq: Every day | SUBCUTANEOUS | Status: DC
  Administered 2018-09-29: 12 [IU] via SUBCUTANEOUS
  Filled 2018-09-29 (×2): qty 0.12

## 2018-09-29 MED ORDER — SENNOSIDES-DOCUSATE SODIUM 8.6-50 MG PO TABS: 1.0000 | ORAL_TABLET | Freq: Every evening | ORAL | Status: DC | PRN

## 2018-09-29 MED ORDER — CYCLOBENZAPRINE HCL 10 MG PO TABS
5.0000 mg | ORAL_TABLET | Freq: Three times a day (TID) | ORAL | Status: DC | PRN
  Administered 2018-09-29: 5 mg via ORAL
  Filled 2018-09-29: qty 1

## 2018-09-29 MED ORDER — LOSARTAN POTASSIUM 50 MG PO TABS
50.0000 mg | ORAL_TABLET | Freq: Every day | ORAL | Status: DC
  Administered 2018-09-29 – 2018-09-30 (×2): 50 mg via ORAL
  Filled 2018-09-29 (×2): qty 1

## 2018-09-29 MED ORDER — BUTALBITAL-APAP-CAFFEINE 50-325-40 MG PO TABS
0.5000 | ORAL_TABLET | Freq: Two times a day (BID) | ORAL | Status: DC | PRN
  Filled 2018-09-29: qty 1

## 2018-09-29 MED ORDER — BISACODYL 5 MG PO TBEC: 5.0000 mg | DELAYED_RELEASE_TABLET | Freq: Every day | ORAL | Status: DC | PRN

## 2018-09-29 MED ORDER — ACETAMINOPHEN 325 MG PO TABS
650.0000 mg | ORAL_TABLET | Freq: Four times a day (QID) | ORAL | Status: DC | PRN
  Administered 2018-09-30: 650 mg via ORAL
  Filled 2018-09-29: qty 2

## 2018-09-29 MED ORDER — POLYETHYLENE GLYCOL 3350 17 G PO PACK
17.0000 g | PACK | Freq: Every day | ORAL | Status: DC
  Administered 2018-09-30: 17 g via ORAL
  Filled 2018-09-29 (×2): qty 1

## 2018-09-29 MED ORDER — POLYVINYL ALCOHOL 1.4 % OP SOLN
1.0000 [drp] | OPHTHALMIC | Status: DC | PRN
  Filled 2018-09-29: qty 15

## 2018-09-29 MED ORDER — VITAMIN K1 10 MG/ML IJ SOLN
10.0000 mg | Freq: Once | INTRAVENOUS | Status: AC
  Administered 2018-09-29: 10 mg via INTRAVENOUS
  Filled 2018-09-29: qty 1

## 2018-09-29 MED ORDER — LORATADINE 10 MG PO TABS
10.0000 mg | ORAL_TABLET | Freq: Every day | ORAL | Status: DC
  Administered 2018-09-29 – 2018-09-30 (×2): 10 mg via ORAL
  Filled 2018-09-29 (×2): qty 1

## 2018-09-29 MED ORDER — VITAMIN K1 1 MG/0.5ML IJ SOLN
10.0000 mg | Freq: Once | INTRAVENOUS | Status: DC
  Filled 2018-09-29: qty 1

## 2018-09-29 MED ORDER — SODIUM CHLORIDE 0.9 % IV SOLN
INTRAVENOUS | Status: DC
  Administered 2018-09-29: 13:00:00 via INTRAVENOUS

## 2018-09-29 MED ORDER — ACETAMINOPHEN 650 MG RE SUPP: 650.0000 mg | Freq: Four times a day (QID) | RECTAL | Status: DC | PRN

## 2018-09-29 MED ORDER — LINACLOTIDE 72 MCG PO CAPS
72.0000 ug | ORAL_CAPSULE | Freq: Every day | ORAL | Status: DC
  Administered 2018-09-30: 72 ug via ORAL
  Filled 2018-09-29 (×2): qty 1

## 2018-09-29 MED ORDER — CALCIUM CARBONATE-VITAMIN D 500-200 MG-UNIT PO TABS
1.0000 | ORAL_TABLET | Freq: Every evening | ORAL | Status: DC
  Administered 2018-09-29: 1 via ORAL
  Filled 2018-09-29: qty 1

## 2018-09-29 MED ORDER — ONDANSETRON HCL 4 MG PO TABS: 4.0000 mg | ORAL_TABLET | Freq: Four times a day (QID) | ORAL | Status: DC | PRN

## 2018-09-29 MED ORDER — OXYMETAZOLINE HCL 0.05 % NA SOLN
1.0000 | Freq: Once | NASAL | Status: AC
  Administered 2018-09-29: 1 via NASAL
  Filled 2018-09-29: qty 30

## 2018-09-29 MED ORDER — INSULIN ASPART 100 UNIT/ML ~~LOC~~ SOLN
0.0000 [IU] | Freq: Three times a day (TID) | SUBCUTANEOUS | Status: DC
  Administered 2018-09-29 – 2018-09-30 (×2): 5 [IU] via SUBCUTANEOUS
  Filled 2018-09-29 (×2): qty 1

## 2018-09-29 MED ORDER — SENNA 8.6 MG PO TABS
2.0000 | ORAL_TABLET | Freq: Two times a day (BID) | ORAL | Status: DC
  Administered 2018-09-29 – 2018-09-30 (×3): 17.2 mg via ORAL
  Filled 2018-09-29 (×3): qty 2

## 2018-09-29 MED ORDER — HYDROCODONE-ACETAMINOPHEN 5-325 MG PO TABS
1.0000 | ORAL_TABLET | Freq: Four times a day (QID) | ORAL | Status: DC | PRN
  Administered 2018-09-29 – 2018-09-30 (×3): 1 via ORAL
  Filled 2018-09-29 (×3): qty 1

## 2018-09-29 MED ORDER — ATORVASTATIN CALCIUM 20 MG PO TABS
40.0000 mg | ORAL_TABLET | Freq: Every day | ORAL | Status: DC
  Administered 2018-09-29: 40 mg via ORAL
  Filled 2018-09-29: qty 2

## 2018-09-29 MED ORDER — ONDANSETRON HCL 4 MG/2ML IJ SOLN: 4.0000 mg | Freq: Four times a day (QID) | INTRAMUSCULAR | Status: DC | PRN

## 2018-09-29 MED ORDER — ALBUTEROL SULFATE (2.5 MG/3ML) 0.083% IN NEBU: 2.5000 mg | INHALATION_SOLUTION | RESPIRATORY_TRACT | Status: DC | PRN

## 2018-09-29 MED ORDER — VENLAFAXINE HCL 37.5 MG PO TABS
75.0000 mg | ORAL_TABLET | Freq: Two times a day (BID) | ORAL | Status: DC
  Administered 2018-09-29 – 2018-09-30 (×3): 75 mg via ORAL
  Filled 2018-09-29 (×5): qty 2

## 2018-09-29 NOTE — H&P (Signed)
[facility] NAME: Erin Daniels    MR#:  [id number]  DATE OF BIRTH:  May 23, 1946  DATE OF ADMISSION:  09/29/2018  PRIMARY CARE PHYSICIAN: Juluis Pitch, MD   REQUESTING/REFERRING PHYSICIAN: Dr. [name].  CHIEF COMPLAINT:   Chief Complaint  Patient presents with  . Epistaxis  . Rectal Bleeding   Epistaxis and rectal bleeding this morning. HISTORY OF PRESENT ILLNESS:  Erin Daniels  is a 73 y.o. female with a known history of multiple medical problems as below.  The patient is sent from nursing home to the ED due to above chief complaints.  She was noticed to have blood from nose and mouth this morning.  She also has some blood in her underwear.  But no active bleeding in the ED.  She was found to have a high INR more than 10 in the ED.  Hemoglobin decreased from 11.7 to 10.7.  She is given vitamin K 10 mg IV in the ED.  Dr. [name] request admission. PAST MEDICAL HISTORY:   Past Medical History:  Diagnosis Date  . At high risk for falls   . Basal cell carcinoma    right upper leg  . Complication of anesthesia    difficult to arouse  . Diabetes mellitus without complication (HCC)    Type I  . Diabetic neuropathy ([place])   . Diabetic retinopathy ([place])   . Dizziness   . Gastroparesis   . Herniated disc, cervical   . Hypercholesteremia   . Hypertension   . Hypotensive episode   . Hypothyroidism   . Insulin pump in place   . Myofascial muscle pain   . Numbness of arm    left  . Osteoporosis   . Poor vision   . Seizures ([place])   . Slurred speech   . 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 Stroke (HCC)    x5  . Vertigo   . Wears glasses     PAST SURGICAL HISTORY:   Past Surgical History:  Procedure Laterality Date  . APPENDECTOMY    . AUGMENTATION MAMMAPLASTY Bilateral 2011  . BILATERAL CARPAL TUNNEL RELEASE    . COLONOSCOPY    . EYE SURGERY    . HEMORROIDECTOMY    . HERNIA REPAIR    . JOINT REPLACEMENT     . OPEN REDUCTION INTERNAL FIXATION (ORIF) DISTAL RADIAL FRACTURE Right 05/29/2015   Procedure: OPEN REDUCTION INTERNAL FIXATION (ORIF) RIGHT DISTAL RADIUS FRACTURE WITH ALLOGRAFT BONE GRAFTING FOR REPAIR AND RECONSTRUCTION;  Surgeon: Roseanne Kaufman, MD;  Location: [facility];  Service: Orthopedics;  Laterality: Right;  . SHOULDER ARTHROSCOPY Left   . SHOULDER HEMI-ARTHROPLASTY Left   . SYNOVECTOMY    . TONSILLECTOMY    . TRIGGER FINGER RELEASE    . TUBAL LIGATION      SOCIAL HISTORY:   Social History   Tobacco Use  . Smoking status: Never Smoker  . Smokeless tobacco: Never Used  Substance Use Topics  . Alcohol use: No    FAMILY HISTORY:   Family History  Problem Relation Age of Onset  . Hypertension Mother   . Stroke Mother   . Heart failure Father     DRUG ALLERGIES:   Allergies  Allergen Reactions  . Codeine Nausea Only and Other (See Comments)    Reaction: stress attacks/ headache Can take Vicodin and Percocet   . Meperidine Nausea Only and Other (See Comments)    Reaction: stress  attacks/ headache  . Metoclopramide Hcl Nausea Only and Other (See Comments)    Reaction: stress attacks/ headache  . Oxycodone Nausea Only and Other (See Comments)    Reaction: stress attacks/ headache  . Stadol [Butorphanol] Nausea Only and Other (See Comments)    Reaction: stress attacks/ headache  . Tussionex Pennkinetic Er [Hydrocod Polst-Cpm Polst Er] Other (See Comments)    Filled patient with sugar   . Carbocaine  [Mepivacaine Hcl] Palpitations    REVIEW OF SYSTEMS:   Review of Systems  Constitutional: Negative for chills, fever and malaise/fatigue.  HENT: Positive for nosebleeds. Negative for sore throat.   Eyes: Negative for blurred vision and double vision.  Respiratory: Negative for cough, hemoptysis, shortness of breath, wheezing and stridor.   Cardiovascular: Negative for chest pain, palpitations, orthopnea and leg swelling.  Gastrointestinal: Negative for abdominal  pain, diarrhea, melena, nausea and vomiting.       Vaginal or rectal bleeding.  Genitourinary: Negative for dysuria, flank pain and hematuria.  Musculoskeletal: Negative for back pain and joint pain.  Neurological: Negative for dizziness, sensory change, focal weakness, seizures, loss of consciousness, weakness and headaches.  Endo/Heme/Allergies: Negative for polydipsia.  Psychiatric/Behavioral: Negative for depression. The patient is not nervous/anxious.     MEDICATIONS AT HOME:   Prior to Admission medications   Medication Sig Start Date End Date Taking? Authorizing Provider  acetaminophen (TYLENOL) 325 MG tablet Take 2 tablets (650 mg total) by mouth every 6 (six) hours as needed for headache. For mild pain 09/18/18  Yes Pyreddy, Reatha Harps, MD  aspirin EC 325 MG tablet Take 325 mg by mouth 2 (two) times daily.  09/19/18  Yes [provider]  atorvastatin (LIPITOR) 40 MG tablet Take 40 mg by mouth daily. 09/18/18  Yes [provider]  butalbital-acetaminophen-caffeine (FIORICET, ESGIC) 50-325-40 MG tablet Take 0.5-1 tablets by mouth 2 (two) times daily as needed for headache. 06/19/18  Yes Gillis Santa, MD  Calcium Carb-Cholecalciferol (CALCIUM 600 + D) 600-200 MG-UNIT TABS Take 1 tablet by mouth every evening.   Yes [provider]  carboxymethylcellulose (REFRESH PLUS) 0.5 % SOLN Place 1 drop into both eyes as needed (for dry eyes).    Yes [provider]  cetirizine (ZYRTEC) 10 MG tablet Take 10 mg by mouth daily.   Yes [provider]  Cholecalciferol (VITAMIN D-3) 5000 units TABS Take 5,000 Units by mouth daily.   Yes [provider]  fentaNYL (DURAGESIC) 25 MCG/HR Place 1 patch onto the skin every 3 (three) days. 09/19/18  Yes Gerlene Fee, NP  gabapentin (NEURONTIN) 300 MG capsule Give 3 tablets (900 mg) by mouth at bedtime 09/19/18  Yes [provider]  gabapentin (NEURONTIN) 300 MG capsule Take 300 mg by mouth 2 (two) times  daily. 09/18/18  Yes [provider]  Garlic 0626 MG CAPS Take 1 capsule by mouth daily.  09/18/18  Yes [provider]  glucose 4 GM chewable tablet Chew 1 tablet by mouth as needed for low blood sugar. <65   Yes [provider]  HYDROcodone-acetaminophen (NORCO/VICODIN) 5-325 MG tablet Take 1 tablet by mouth every 6 (six) hours as needed for moderate pain.   Yes [provider]  insulin aspart (NOVOLOG FLEXPEN) 100 UNIT/ML FlexPen Inject 0-15 units subcutaneous per sliding scale before meals and at bedtime 0 - 100 units = 0 units 101 - 120 = 1 unit 121 - 150 = 2 units 151 - 200 = 4 units 201 - 250 =  6 units 251 - 300 = 8 units 301 - 350 = 10 units 351 - 400 = 12 units 401 - 450 = 15 units > 450 call MD 09/19/18  Yes [provider]  insulin aspart (NOVOLOG FLEXPEN) 100 UNIT/ML FlexPen Inject into the skin daily. Inject subcutaneous per sliding scale 0 - 100 = 0 units 101 - 120 = 1 unit 121 - 150 = 2 units 151 - 200 = 4 units 201 - 250 = 6 units 251 - 300 = 8 units 301 - 350 = 10 units 351 - 400 = 12 units   Yes [provider]  Insulin Glargine (LANTUS SOLOSTAR) 100 UNIT/ML Solostar Pen Inject 12 Units into the skin at bedtime. 09/19/18  Yes [provider]  linaclotide (LINZESS) 72 MCG capsule Take 72 mcg by mouth daily. 09/24/18  Yes [provider]  LORazepam (ATIVAN) 0.5 MG tablet Take 0.5 mg by mouth every 6 (six) hours as needed for anxiety.   Yes [provider]  losartan (COZAAR) 50 MG tablet Take 50 mg by mouth daily.    Yes [provider]  Multiple Vitamins-Minerals (CEROVITE ADVANCED FORMULA PO) Take 1 tablet by mouth daily. 09/18/18  Yes [provider]  polyethylene glycol (MIRALAX / GLYCOLAX) packet Take 17 g by mouth daily.    Yes [provider]  promethazine (PHENERGAN) 25 MG suppository Place 25 mg rectally every 6 (six) hours as needed for nausea or vomiting.   Yes  [provider]  senna (SENOKOT) 8.6 MG TABS tablet Take 2 tablets by mouth 2 (two) times daily. 09/19/18  Yes [provider]  venlafaxine (EFFEXOR) 75 MG tablet Take 75 mg by mouth 2 (two) times daily.   Yes [provider]  vitamin C (ASCORBIC ACID) 500 MG tablet Take 500 mg by mouth 2 (two) times daily.   Yes [provider]  warfarin (COUMADIN) 5 MG tablet Take 7.5 mg by mouth every evening.    Yes [provider]      VITAL SIGNS:  Blood pressure 126/63, pulse 72, temperature 98 F (36.7 C), resp. rate 16, height 5\' 2"  (1.575 m), weight 45 kg, SpO2 100 %.  PHYSICAL EXAMINATION:  Physical Exam  GENERAL:  73 y.o.-year-old patient lying in the bed with no acute distress.  EYES: Pupils equal, round, reactive to light and accommodation. No scleral icterus. Extraocular muscles intact.  HEENT: Head atraumatic, normocephalic.  Dry blood in oropharynx and nasopharynx.  NECK:  Supple, no jugular venous distention. No thyroid enlargement, no tenderness.  LUNGS: Normal breath sounds bilaterally, no wheezing, rales,rhonchi or crepitation. No use of accessory muscles of respiration.  CARDIOVASCULAR: S1, S2 normal. No murmurs, rubs, or gallops.  ABDOMEN: Soft, nontender, nondistended. Bowel sounds present. No organomegaly or mass.  EXTREMITIES: No pedal edema, cyanosis, or clubbing.  NEUROLOGIC: Cranial nerves II through XII are intact. Muscle strength 5/5 in all extremities. Sensation intact. Gait not checked.  PSYCHIATRIC: The patient is alert and oriented x 3.  SKIN: No obvious rash, lesion, or ulcer.   LABORATORY PANEL:   CBC Recent Labs  Lab 09/29/18 0751  WBC 9.7  HGB 10.7*  HCT 33.2*  PLT 378   ------------------------------------------------------------------------------------------------------------------  Chemistries  Recent Labs  Lab 09/29/18 0751  NA 136  K 3.9  CL 99  CO2 31  GLUCOSE 99  BUN 17  CREATININE 0.50    CALCIUM 8.7*  AST 46*  ALT 28  ALKPHOS 152*  BILITOT 0.5   ------------------------------------------------------------------------------------------------------------------  Cardiac Enzymes No results for input(s): TROPONINI in the last 168 hours. ------------------------------------------------------------------------------------------------------------------  RADIOLOGY:  No results found.    IMPRESSION AND PLAN:   Epistaxis and rectal bleeding due to hypercoagulopathy. The patient will be admitted to medical floor. She is given vitamin K 10 mg IV 1 dose in the ED. Hold Coumadin, follow-up INR and hemoglobin.  PRBC transfusion PRN.  Diabetes.  Start sliding scale. Stroke.  Hold Coumadin.  Hold aspirin.  Continue Lipitor. Hypertension.  Continue hypertension medication.  All the records are reviewed and case discussed with ED provider. Management plans discussed with the patient, her husband and they are in agreement.  CODE STATUS: DNR TOTAL TIME TAKING CARE OF THIS PATIENT: 35 minutes.    Demetrios Loll M.D on 09/29/2018 at 10:59 AM  Between 7am to 6pm - Pager - [phone]  After 6pm go to [email or web address] - Proofreader  [facility]  Office  [phone]  CC: Primary care physician; Juluis Pitch, MD   Note: This dictation was prepared with Dragon dictation along with smaller phrase technology. Any transcriptional errors that result from this process are unin

## 2018-09-29 NOTE — Clinical Social Work Note (Signed)
CSW met with the patient's husband outside the room to transition care from [place] to inpatient CSW. The patient plans to return to [facility] when stable. The CSW is following and updated the patient's husband on the transition of care team's role in care.   Santiago Bumpers, MSW, Latanya Presser [phone]

## 2018-09-29 NOTE — ED Notes (Signed)
Date and time results received: 09/29/18 0929   Test: INR  Critical Value: 13.57  Name of Provider Notified: Dr. [name]   Orders Received? Or Actions Taken?: continue with Vit K, possible FFP later. Observation for now.

## 2018-09-29 NOTE — ED Notes (Signed)
Upon inspection with MD [name] pt rectum appears bruised and slight blood noted to depends from rectum. No active bleeding noted.   Nosebleed under control at this time, tissue removed.

## 2018-09-29 NOTE — ED Notes (Signed)
Dr. Chen at bedside.

## 2018-09-29 NOTE — Clinical Social Work Note (Signed)
Clinical Social Work Assessment  Patient Details  Name: Erin Daniels MRN: [id number] Date of Birth: 1946-06-10  Date of referral:  09/29/18               Reason for consult:  Facility Placement, Discharge Planning, Care Management Concerns                Permission sought to share information with:  Case Manager, Facility Sport and exercise psychologist, Family Supports Permission granted to share information::  Yes, Verbal Permission Granted  Name::     Kyliana Standen, husband, [phone] or [phone]  Agency::  [facility] facility rep.   Relationship::  Chevelle Coulson, husband, [phone] or [phone]  Contact Information:  Walterine Amodei, husband, [phone] or [phone]  Housing/Transportation Living arrangements for the past 2 months:  [place], [place] of Information:  Patient, Spouse Patient Interpreter Needed:  None Criminal Activity/Legal Involvement Pertinent to Current Situation/Hospitalization:  No - Comment as needed Significant Relationships:  Spouse Lives with:  Facility Resident(Currently SNF plan to return home after SNF) Do you feel safe going back to the place where you live?  Yes Need for family participation in patient care:  No (Coment)  Care giving concerns:  Patient and her husband stated that pt would return to [facility]  Social Worker assessment / plan:  CSW met with the pt in the ED at pt's bedside. Patient gave verbal permission for assessment w/husban present.  Patient stated that she would like to return to [facility] upon discharge from [facility]. Pt alert and oriented X4.  Employment status:  Retired Forensic scientist:  [employer] PT Recommendations:  [place] / Referral to community resources:  [place]  Patient/Family's Response to care: Pt and her husband are agreeable for pt to re-admission to [facility] SN[facility]   Patient/Family's Understanding of and Emotional  Response to Diagnosis, Current Treatment, and Prognosis:  Pt and her husband demonstrated good understanding with plan and diagnosis.    Emotional Assessment Appearance:  Appears older than stated age Attitude/Demeanor/Rapport:    Affect (typically observed):  Accepting Orientation:  Oriented to Self, Oriented to Place, Oriented to  Time, Oriented to Situation Alcohol / Substance use:  Not Applicable Psych involvement (Current and /or in the community):     Discharge Needs  Concerns to be addressed:  Discharge Planning Concerns, Care Coordination Readmission within the last 30 days:  Yes Current discharge risk:  None Barriers to Discharge:  Continued Medical Work up   [employer], LCSW 09/29/2018, 3:01 PM

## 2018-09-29 NOTE — NC FL2 (Addendum)
[place] LEVEL OF CARE SCREENING TOOL     IDENTIFICATION  Patient Name: Erin Daniels Birthdate: 1946-02-06 Sex: female Admission Date (Current Location): 09/29/2018  [place] and [place] Number:  Engineering geologist and Address:  [facility], [address] [place], [place] [address]      Provider Number: [id number]  Attending Physician Name and Address:  Demetrios Loll, MD  Relative Name and Phone Number:  Kristine Linea, Husband [phone]    Current Level of Care: Hospital Recommended Level of Care: [place] Prior Approval Number:    Date Approved/Denied:   PASRR Number: [id number]  Discharge Plan: SNF    Current Diagnoses: Patient Active Problem List   Diagnosis Date Noted  . Hypercoagulopathy ([place]) 09/29/2018  . Hypertension associated with type 1 diabetes mellitus ([place]) 09/19/2018  . Dyslipidemia due to type 1 diabetes mellitus ([place]) 09/19/2018  . Anemia of chronic disease 09/19/2018  . Chronic migraine without aura 09/19/2018  . Chronic non-seasonal allergic rhinitis 09/19/2018  . Major depression, recurrent, chronic ([place]) 09/19/2018  . Diabetic gastroparesis associated with type 1 diabetes mellitus ([place]) 09/19/2018  . Bilateral sacral insufficiency fracture 09/19/2018  . Constipation 09/15/2018  . Acute urinary retention 09/15/2018  . Intractable pain 09/15/2018  . Chronic cerebrovascular accident (CVA) 05/28/2018  . Diabetic polyneuropathy associated with type 1 diabetes mellitus ([place]) 12/10/2017  . Numbness and tingling of both legs 12/10/2017  . Chronic pain syndrome 12/10/2017  . DKA (diabetic ketoacidoses) ([place]) 11/12/2017  . Confusion 11/11/2017  . Hypoglycemia 11/26/2016  . Lactic acid acidosis   . Sepsis ([place])   . 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 Essential hypertension   . Depression   . Right radial fracture 05/29/2015    Orientation  RESPIRATION BLADDER Height & Weight     Self, Time, Situation, Place  O2(02 2 litter oxigen) Continent Weight: 99 lb 3.3 oz (45 kg) Height:  5\' 2"  (157.5 cm)  BEHAVIORAL SYMPTOMS/MOOD NEUROLOGICAL BOWEL NUTRITION STATUS      Continent Clear liquid diet. Thin fluid consistency   AMBULATORY STATUS COMMUNICATION OF NEEDS Skin   Extensive Assist Verbally Normal                       Personal Care Assistance Level of Assistance  Bathing, Feeding, Dressing Bathing Assistance: Limited assistance Feeding assistance: Independent Dressing Assistance: Limited assistance Total Care Assistance: Limited assistance   Functional Limitations Info  Sight, Hearing, Speech Sight Info: Adequate Hearing Info: Adequate Speech Info: Adequate    SPECIAL CARE FACTORS FREQUENCY        PT Frequency: 5x OT Frequency: 3x            Contractures Contractures Info: Not present    Additional Factors Info  Code Status, Allergies Code Status Info: DNR Allergies Info: CODEINE, MEPERIDINE, METOCLOPRAMIDE HCL, OXYCODONE, Stadol BUTORPHANOL, TUSSIONEX PENNKINETIC ER HYDROCOD POLST-CPM POLST ER, Carbocaine  MEPIVACAINE HCL            Current Medications (09/29/2018):  This is the current hospital active medication list Current Facility-Administered Medications  Medication Dose Route Frequency Provider Last Rate Last Dose  . 0.9 %  sodium chloride infusion   Intravenous Continuous Demetrios Loll, MD 75 mL/hr at 09/29/18 1241    . acetaminophen (TYLENOL) tablet 650 mg  650 mg Oral Q6H PRN Demetrios Loll, MD       Or  . acetaminophen (TYLENOL) suppository 650  mg  650 mg Rectal Q6H PRN Demetrios Loll, MD      . albuterol (PROVENTIL) (2.5 MG/3ML) 0.083% nebulizer solution 2.5 mg  2.5 mg Nebulization Q2H PRN Demetrios Loll, MD      . atorvastatin (LIPITOR) tablet 40 mg  40 mg Oral q1800 Demetrios Loll, MD      . bisacodyl (DULCOLAX) EC tablet 5 mg  5 mg Oral Daily PRN Demetrios Loll, MD      . butalbital-acetaminophen-caffeine  (FIORICET, ESGIC) [phone] MG per tablet 0.5-1 tablet  0.5-1 tablet Oral BID PRN Demetrios Loll, MD      . calcium-vitamin D (OSCAL WITH D) 500-200 MG-UNIT per tablet 1 tablet  1 tablet Oral QPM Demetrios Loll, MD      . cholecalciferol (VITAMIN D3) tablet 1,000 Units  1,000 Units Oral Daily Demetrios Loll, MD   1,000 Units at 09/29/18 1232  . [START ON 10/01/2018] fentaNYL (DURAGESIC) 25 MCG/HR 1 patch  1 patch Transdermal Q72H Demetrios Loll, MD      . gabapentin (NEURONTIN) capsule 300 mg  300 mg Oral BID WC Demetrios Loll, MD      . gabapentin (NEURONTIN) capsule 900 mg  900 mg Oral QHS Demetrios Loll, MD      . HYDROcodone-acetaminophen (NORCO/VICODIN) 5-325 MG per tablet 1 tablet  1 tablet Oral Q6H PRN Demetrios Loll, MD   1 tablet at 09/29/18 1232  . insulin aspart (novoLOG) injection 0-5 Units  0-5 Units Subcutaneous QHS Demetrios Loll, MD      . insulin aspart (novoLOG) injection 0-9 Units  0-9 Units Subcutaneous TID WC Demetrios Loll, MD      . insulin glargine (LANTUS) injection 12 Units  12 Units Subcutaneous QHS Demetrios Loll, MD      . linaclotide [facility]) capsule 72 mcg  72 mcg Oral Daily Demetrios Loll, MD      . loratadine (CLARITIN) tablet 10 mg  10 mg Oral Daily Demetrios Loll, MD   10 mg at 09/29/18 1232  . LORazepam (ATIVAN) tablet 0.5 mg  0.5 mg Oral Q6H PRN Demetrios Loll, MD      . losartan (COZAAR) tablet 50 mg  50 mg Oral Daily Demetrios Loll, MD   50 mg at 09/29/18 1232  . ondansetron (ZOFRAN) tablet 4 mg  4 mg Oral Q6H PRN Demetrios Loll, MD       Or  . ondansetron [facility]) injection 4 mg  4 mg Intravenous Q6H PRN Demetrios Loll, MD      . polyethylene glycol (MIRALAX / GLYCOLAX) packet 17 g  17 g Oral Daily Demetrios Loll, MD      . polyvinyl alcohol (LIQUIFILM TEARS) 1.4 % ophthalmic solution 1 drop  1 drop Both Eyes PRN Demetrios Loll, MD      . [facility]) tablet 17.2 mg  2 tablet Oral BID Demetrios Loll, MD   17.2 mg at 09/29/18 1232  . senna-docusate (Senokot-S) tablet 1 tablet  1 tablet Oral QHS PRN Demetrios Loll, MD      .  venlafaxine [facility]) tablet 75 mg  75 mg Oral BID Demetrios Loll, MD   75 mg at 09/29/18 1232     Discharge Medications: Please see discharge summary for a list of discharge medications.  Relevant Imaging Results:  Relevant Lab Results:   Additional Information SSN [id number]  Berenice Bouton, LCSW

## 2018-09-29 NOTE — ED Notes (Signed)
ED TO INPATIENT HANDOFF REPORT  ED Nurse Name and Phone #: Anderson Malta [phone]  Name/Age/Gender Erin Daniels 73 y.o. female Room/Bed: ED09A/ED09A  Code Status   Code Status: Prior  Home/SNF/Other Home Patient oriented to: self, place, time and situation Is this baseline? Yes   Triage Complete: Triage complete  Chief Complaint nosebleed  Triage Note Pt arrives from [facility] for nosebleed that began this AM. Noted to have tissue inside bilat nostrils and dried blood noted to lips. Also states she noticed blood in her underwear this AM. Has L hip frracture that EMS reports is non-operable. Pt c/o of hip pain upon arrival. Takes coumadin for previous strokes. Denies falling this AM or being hit in face. A&O.    Allergies Allergies  Allergen Reactions  . Codeine Nausea Only and Other (See Comments)    Reaction: stress attacks/ headache Can take Vicodin and Percocet   . Meperidine Nausea Only and Other (See Comments)    Reaction: stress attacks/ headache  . Metoclopramide Hcl Nausea Only and Other (See Comments)    Reaction: stress attacks/ headache  . Oxycodone Nausea Only and Other (See Comments)    Reaction: stress attacks/ headache  . Stadol [Butorphanol] Nausea Only and Other (See Comments)    Reaction: stress attacks/ headache  . Tussionex Pennkinetic Er [Hydrocod Polst-Cpm Polst Er] Other (See Comments)    Filled patient with sugar   . Carbocaine  [Mepivacaine Hcl] Palpitations    Level of Care/Admitting Diagnosis ED Disposition    ED Disposition Condition [facility] Area: [place] [[id number]]  Level of Care: Med-Surg [16]  Diagnosis: Hypercoagulopathy [facility]) [[id number]]  Admitting Physician: Demetrios Loll [[id number]]  Attending Physician: Demetrios Loll [[id number]]  Estimated length of stay: past midnight tomorrow  Certification:: I certify this patient will need inpatient services for at least 2 midnights  PT Class (Do Not Modify):  Inpatient [101]  PT Acc Code (Do Not Modify): Private [1]       Medical/Surgery History Past Medical History:  Diagnosis Date  . At high risk for falls   . Basal cell carcinoma    right upper leg  . Complication of anesthesia    difficult to arouse  . Diabetes mellitus without complication (HCC)    Type I  . Diabetic neuropathy ([place])   . Diabetic retinopathy ([place])   . Dizziness   . Gastroparesis   . Herniated disc, cervical   . Hypercholesteremia   . Hypertension   . Hypotensive episode   . Hypothyroidism   . Insulin pump in place   . Myofascial muscle pain   . Numbness of arm    left  . Osteoporosis   . Poor vision   . Seizures ([place])   . Slurred speech   . Stroke (cerebrum) ([place])    4/19  . Stroke (HCC)    x5  . Vertigo   . Wears glasses    Past Surgical History:  Procedure Laterality Date  . APPENDECTOMY    . AUGMENTATION MAMMAPLASTY Bilateral 2011  . BILATERAL CARPAL TUNNEL RELEASE    . COLONOSCOPY    . EYE SURGERY    . HEMORROIDECTOMY    . HERNIA REPAIR    . JOINT REPLACEMENT    . OPEN REDUCTION INTERNAL FIXATION (ORIF) DISTAL RADIAL FRACTURE Right 05/29/2015   Procedure: OPEN REDUCTION INTERNAL FIXATION (ORIF) RIGHT DISTAL RADIUS FRACTURE WITH ALLOGRAFT BONE GRAFTING FOR REPAIR AND RECONSTRUCTION;  Surgeon: Roseanne Kaufman, MD;  Location: [facility]  OR;  Service: Orthopedics;  Laterality: Right;  . SHOULDER ARTHROSCOPY Left   . SHOULDER HEMI-ARTHROPLASTY Left   . SYNOVECTOMY    . TONSILLECTOMY    . TRIGGER FINGER RELEASE    . TUBAL LIGATION       IV Location/Drains/Wounds Patient Lines/Drains/Airways Status   Active Line/Drains/Airways    Name:   Placement date:   Placement time:   Site:   Days:   Peripheral IV 09/29/18 Left Forearm   09/29/18    0757    Forearm   less than 1          Intake/Output Last 24 hours No intake or output data in the 24 hours ending 09/29/18 1112  Labs/Imaging Results for orders placed or performed during the hospital  encounter of 09/29/18 (from the past 48 hour(s))  CBC with Differential/Platelet     Status: Abnormal   Collection Time: 09/29/18  7:51 AM  Result Value Ref Range   WBC 9.7 4.0 - 10.5 K/uL   RBC 3.50 (L) 3.87 - 5.11 MIL/uL   Hemoglobin 10.7 (L) 12.0 - 15.0 g/dL   HCT 33.2 (L) 36.0 - 46.0 %   MCV 94.9 80.0 - 100.0 fL   MCH 30.6 26.0 - 34.0 pg   MCHC 32.2 30.0 - 36.0 g/dL   RDW 12.0 11.5 - 15.5 %   Platelets 378 150 - 400 K/uL   nRBC 0.0 0.0 - 0.2 %   Neutrophils Relative % 69 %   Neutro Abs 6.6 1.7 - 7.7 K/uL   Lymphocytes Relative 15 %   Lymphs Abs 1.4 0.7 - 4.0 K/uL   Monocytes Relative 10 %   Monocytes Absolute 1.0 0.1 - 1.0 K/uL   Eosinophils Relative 4 %   Eosinophils Absolute 0.4 0.0 - 0.5 K/uL   Basophils Relative 0 %   Basophils Absolute 0.0 0.0 - 0.1 K/uL   Immature Granulocytes 2 %   Abs Immature Granulocytes 0.15 (H) 0.00 - 0.07 K/uL    Comment: Performed at [facility], [address] [place], [place] [address]  Comprehensive metabolic panel     Status: Abnormal   Collection Time: 09/29/18  7:51 AM  Result Value Ref Range   Sodium 136 135 - 145 mmol/L   Potassium 3.9 3.5 - 5.1 mmol/L   Chloride 99 98 - 111 mmol/L   CO2 31 22 - 32 mmol/L   Glucose, Bld 99 70 - 99 mg/dL   BUN 17 8 - 23 mg/dL   Creatinine, Ser 0.50 0.44 - 1.00 mg/dL   Calcium 8.7 (L) 8.9 - 10.3 mg/dL   Total Protein 6.7 6.5 - 8.1 g/dL   Albumin 3.0 (L) 3.5 - 5.0 g/dL   AST 46 (H) 15 - 41 U/L   ALT 28 0 - 44 U/L   Alkaline Phosphatase 152 (H) 38 - 126 U/L   Total Bilirubin 0.5 0.3 - 1.2 mg/dL   GFR calc non Af Amer >60 >60 mL/min   GFR calc Af Amer >60 >60 mL/min   Anion gap 6 5 - 15    Comment: Performed at [facility], [address] [place], [place] [address]  Protime-INR     Status: Abnormal   Collection Time: 09/29/18  7:51 AM  Result Value Ref Range   Prothrombin Time >90.0 (H) 11.4 - 15.2 seconds   INR >10.00 (HH)     Comment: RESULT REPEATED AND  VERIFIED CRITICAL RESULT CALLED TO, READ BACK BY AND VERIFIED WITH: KATE  BUMGARNER AT 0930 ON 09/29/2018 [place]. Performed at [facility], [address] [place], [place] [address]    No results found.  Pending Labs Unresulted Labs (From admission, onward)    Start     Ordered   09/29/18 0757  Urinalysis, Complete w Microscopic  Once,   STAT     09/29/18 0756   Signed and Held  Basic metabolic panel  Tomorrow morning,   R     Signed and Held   Signed and Held  CBC  Tomorrow morning,   R     Signed and Held   Signed and Held  Hemoglobin  Once,   R     Signed and Held   Signed and Held  Protime-INR  Tomorrow morning,   R     Signed and Held          Vitals/Pain Today's Vitals   09/29/18 0930 09/29/18 1000 09/29/18 1030 09/29/18 1100  BP: 128/64 (!) 127/59 126/63 (!) 155/63  Pulse: 73 74 72 80  Resp:      Temp:      SpO2: 96% 96% 100% 97%  Weight:      Height:      PainSc:        Isolation Precautions No active isolations  Medications Medications  oxymetazoline (AFRIN) 0.05 % nasal spray 1 spray (1 spray Each Nare Given 09/29/18 0848)  phytonadione (VITAMIN K) 10 mg in dextrose 5 % 50 mL IVPB (0 mg Intravenous Stopped 09/29/18 0949)    Mobility walks with device Moderate fall risk   Focused Assessments    Recommendations: See Admitting Provider Note  Report given to:   Additional Notes:

## 2018-09-29 NOTE — ED Triage Notes (Signed)
Pt arrives from [facility] for nosebleed that began this AM. Noted to have tissue inside bilat nostrils and dried blood noted to lips. Also states she noticed blood in her underwear this AM. Has L hip frracture that EMS reports is non-operable. Pt c/o of hip pain upon arrival. Takes coumadin for previous strokes. Denies falling this AM or being hit in face. A&O.

## 2018-09-29 NOTE — ED Provider Notes (Signed)
[facility] Emergency Department Provider Note       Time seen: ----------------------------------------- 8:00 AM on 09/29/2018 -----------------------------------------   I have reviewed the triage vital signs and the nursing notes.  HISTORY   Chief Complaint Epistaxis and Vaginal Bleeding    HPI Erin Daniels is a 73 y.o. female with a history of diabetes, dizziness, gastroparesis, hyperlipidemia, hypertension, seizures who presents to the ED for bleeding began this morning.  Patient was noted to have blood and around her nose and mouth.  She does not complain of frequent nosebleeds.  They also noticed some blood in her underwear this morning.  Recently she was noted to have a left hip fracture that it according to EMS was nonoperable.  She takes Coumadin for previous strokes.  Past Medical History:  Diagnosis Date  . At high risk for falls   . Basal cell carcinoma    right upper leg  . Complication of anesthesia    difficult to arouse  . Diabetes mellitus without complication (HCC)    Type I  . Diabetic neuropathy ([place])   . Diabetic retinopathy ([place])   . Dizziness   . Gastroparesis   . Herniated disc, cervical   . Hypercholesteremia   . Hypertension   . Hypotensive episode   . Hypothyroidism   . Insulin pump in place   . Myofascial muscle pain   . Numbness of arm    left  . Osteoporosis   . Poor vision   . Seizures ([place])   . Slurred speech   . Stroke (cerebrum) ([place])    4/19  . Stroke (HCC)    x5  . Vertigo   . Wears glasses     Patient Active Problem List   Diagnosis Date Noted  . Hypertension associated with type 1 diabetes mellitus ([place]) 09/19/2018  . Dyslipidemia due to type 1 diabetes mellitus ([place]) 09/19/2018  . Anemia of chronic disease 09/19/2018  . Chronic migraine without aura 09/19/2018  . Chronic non-seasonal allergic rhinitis 09/19/2018  . Major depression, recurrent, chronic ([place]) 09/19/2018  . Diabetic  gastroparesis associated with type 1 diabetes mellitus ([place]) 09/19/2018  . Bilateral sacral insufficiency fracture 09/19/2018  . Constipation 09/15/2018  . Acute urinary retention 09/15/2018  . Intractable pain 09/15/2018  . Chronic cerebrovascular accident (CVA) 05/28/2018  . Diabetic polyneuropathy associated with type 1 diabetes mellitus ([place]) 12/10/2017  . Numbness and tingling of both legs 12/10/2017  . Chronic pain syndrome 12/10/2017  . DKA (diabetic ketoacidoses) ([place]) 11/12/2017  . Confusion 11/11/2017  . Hypoglycemia 11/26/2016  . Lactic acid acidosis   . Sepsis ([place])   . 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 Essential hypertension   . Depression   . Right radial fracture 05/29/2015    Past Surgical History:  Procedure Laterality Date  . APPENDECTOMY    . AUGMENTATION MAMMAPLASTY Bilateral 2011  . BILATERAL CARPAL TUNNEL RELEASE    . COLONOSCOPY    . EYE SURGERY    . HEMORROIDECTOMY    . HERNIA REPAIR    . JOINT REPLACEMENT    . OPEN REDUCTION INTERNAL FIXATION (ORIF) DISTAL RADIAL FRACTURE Right 05/29/2015   Procedure: OPEN REDUCTION INTERNAL FIXATION (ORIF) RIGHT DISTAL RADIUS FRACTURE WITH ALLOGRAFT BONE GRAFTING FOR REPAIR AND RECONSTRUCTION;  Surgeon: Roseanne Kaufman, MD;  Location: [facility];  Service: Orthopedics;  Laterality: Right;  . SHOULDER ARTHROSCOPY Left   . SHOULDER HEMI-ARTHROPLASTY Left   . SYNOVECTOMY    .  TONSILLECTOMY    . TRIGGER FINGER RELEASE    . TUBAL LIGATION      Allergies Codeine; Meperidine; Metoclopramide hcl; Oxycodone; Stadol [butorphanol]; Tussionex pennkinetic er [employer] polst-cpm polst er]; and Carbocaine  [mepivacaine hcl]  Social History Social History   Tobacco Use  . Smoking status: Never Smoker  . Smokeless tobacco: Never Used  Substance Use Topics  . Alcohol use: No  . Drug use: No   Review of Systems Constitutional: Negative for fever. ENT: Positive for  epistaxis Cardiovascular: Negative for chest pain. Respiratory: Negative for shortness of breath. Gastrointestinal: Negative for abdominal pain, vomiting and diarrhea.  Positive for rectal bleeding Musculoskeletal: Negative for back pain. Skin: Negative for rash. Neurological: Negative for headaches, focal weakness or numbness.  All systems negative/normal/unremarkable except as stated in the HPI  ____________________________________________   PHYSICAL EXAM:  VITAL SIGNS: ED Triage Vitals [09/29/18 0755]  Enc Vitals Group     BP (!) 159/71     Pulse Rate 74     Resp 16     Temp 98 F (36.7 C)     Temp src      SpO2 100 %     Weight 99 lb 3.3 oz (45 kg)     Height 5\' 2"  (1.575 m)     Head Circumference      Peak Flow      Pain Score 10     Pain Loc      Pain Edu?      Excl. in [place]?     Constitutional: Alert and oriented.  No distress Eyes: Conjunctivae are normal. Normal extraocular movements. ENT      Head: Normocephalic and atraumatic.      Nose: Recent bleeding is noted from bilateral naris.  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  No active bleeding      Mouth/Throat: Dried blood in the mouth and oropharynx      Neck: No stridor. Cardiovascular: Normal rate, regular rhythm. No murmurs, rubs, or gallops. Respiratory: Normal respiratory effort without tachypnea nor retractions. Breath sounds are clear and equal bilaterally. No wheezes/rales/rhonchi. Gastrointestinal: Soft and nontender. Normal bowel sounds Rectal: Blood is visualized around the rectum Musculoskeletal: Limited range of motion of the extremities. Neurologic:  Normal speech and language. No gross focal neurologic deficits are appreciated.  Skin: Pallor is noted Psychiatric: Speech and behavior are normal.  ____________________________________________  ED COURSE:  As part of my medical decision making, I reviewed the following data within the [place] History obtained from family if  available, nursing notes, old chart and ekg, as well as notes from prior ED visits. Patient presented for epistaxis with noted rectal bleeding as well, we will assess with labs and imaging as indicated at this time.   Procedures ____________________________________________   LABS (pertinent positives/negatives)  Labs Reviewed  CBC WITH DIFFERENTIAL/PLATELET - Abnormal; Notable for the following components:      Result Value   RBC 3.50 (*)    Hemoglobin 10.7 (*)    HCT 33.2 (*)    Abs Immature Granulocytes 0.15 (*)    All other components within normal limits  COMPREHENSIVE METABOLIC PANEL - Abnormal; Notable for the following components:   Calcium 8.7 (*)    Albumin 3.0 (*)    AST 46 (*)    Alkaline Phosphatase 152 (*)    All other components within normal limits  PROTIME-INR - Abnormal; Notable for the following components:   Prothrombin Time >90.0 (*)  INR >10.00 (*)    All other components within normal limits  URINALYSIS, COMPLETE (UACMP) WITH MICROSCOPIC   CRITICAL CARE Performed by: Laurence Aly   Total critical care time: 30 minutes  Critical care time was exclusive of separately billable procedures and treating other patients.  Critical care was necessary to treat or prevent imminent or life-threatening deterioration.  Critical care was time spent personally by me on the following activities: development of treatment plan with patient and/or surrogate as well as nursing, discussions with consultants, evaluation of patient's response to treatment, examination of patient, obtaining history from patient or surrogate, ordering and performing treatments and interventions, ordering and review of laboratory studies, ordering and review of radiographic studies, pulse oximetry and re-evaluation of patient's condition.  ____________________________________________   DIFFERENTIAL DIAGNOSIS   Medication side effect, anemia, epistaxis, diverticulosis, dehydration,  electrolyte abnormality  FINAL ASSESSMENT AND PLAN  Epistaxis, rectal bleeding, supratherapeutic INR   Plan: The patient had presented for epistaxis and recent noted rectal bleeding. Patient's labs did reveal a drop in her hemoglobin compared to 10 days ago.  She has not had any further bleeding here.  Her INR was markedly elevated, officially came back at around 43.  I did give her IV vitamin K to prevent any further bleeding given that she is having epistaxis and rectal bleeding.  I did not see a need for 4 factor prothrombin complex concentrate.  I will discuss with the hospitalist for admission.   Laurence Aly, MD    Note: This note was generated in part or whole with voice recognition software. Voice recognition is usually quite accurate but there are transcription errors that can and very often do occur. I apologize for any typographical errors that were not detected and corrected.     Earleen Newport, MD 09/29/18 [phone]

## 2018-09-30 ENCOUNTER — Non-Acute Institutional Stay (SKILLED_NURSING_FACILITY): Payer: Medicare Other | Admitting: Adult Health

## 2018-09-30 ENCOUNTER — Other Ambulatory Visit: Payer: Self-pay | Admitting: Adult Health

## 2018-09-30 ENCOUNTER — Encounter: Payer: Self-pay | Admitting: Adult Health

## 2018-09-30 DIAGNOSIS — R040 Epistaxis: Secondary | ICD-10-CM | POA: Diagnosis not present

## 2018-09-30 DIAGNOSIS — E1069 Type 1 diabetes mellitus with other specified complication: Secondary | ICD-10-CM | POA: Diagnosis not present

## 2018-09-30 DIAGNOSIS — K5904 Chronic idiopathic constipation: Secondary | ICD-10-CM

## 2018-09-30 DIAGNOSIS — K3184 Gastroparesis: Secondary | ICD-10-CM

## 2018-09-30 DIAGNOSIS — E1059 Type 1 diabetes mellitus with other circulatory complications: Secondary | ICD-10-CM

## 2018-09-30 DIAGNOSIS — E103553 Type 1 diabetes mellitus with stable proliferative diabetic retinopathy, bilateral: Secondary | ICD-10-CM

## 2018-09-30 DIAGNOSIS — I10 Essential (primary) hypertension: Secondary | ICD-10-CM

## 2018-09-30 DIAGNOSIS — E785 Hyperlipidemia, unspecified: Secondary | ICD-10-CM

## 2018-09-30 DIAGNOSIS — M8448XS Pathological fracture, other site, sequela: Secondary | ICD-10-CM

## 2018-09-30 DIAGNOSIS — E1043 Type 1 diabetes mellitus with diabetic autonomic (poly)neuropathy: Secondary | ICD-10-CM

## 2018-09-30 DIAGNOSIS — G894 Chronic pain syndrome: Secondary | ICD-10-CM

## 2018-09-30 DIAGNOSIS — I6930 Unspecified sequelae of cerebral infarction: Secondary | ICD-10-CM

## 2018-09-30 DIAGNOSIS — D6832 Hemorrhagic disorder due to extrinsic circulating anticoagulants: Secondary | ICD-10-CM | POA: Diagnosis not present

## 2018-09-30 LAB — BASIC METABOLIC PANEL
Anion gap: 6 (ref 5–15)
BUN: 9 mg/dL (ref 8–23)
CO2: 31 mmol/L (ref 22–32)
Calcium: 9 mg/dL (ref 8.9–10.3)
Chloride: 102 mmol/L (ref 98–111)
Creatinine, Ser: 0.47 mg/dL (ref 0.44–1.00)
GFR calc Af Amer: >60 mL/min (ref >60)
GFR calc non Af Amer: >60 mL/min (ref >60)
Glucose, Bld: 47 mg/dL — ABNORMAL LOW (ref 70–99)
Potassium: 4.1 mmol/L (ref 3.5–5.1)
SODIUM: 139 mmol/L (ref 135–145)

## 2018-09-30 LAB — GLUCOSE, CAPILLARY
Glucose-Capillary: 47 mg/dL — ABNORMAL LOW (ref 70–99)
Glucose-Capillary: 48 mg/dL — ABNORMAL LOW (ref 70–99)
Glucose-Capillary: 112 mg/dL — ABNORMAL HIGH (ref 70–99)
Glucose-Capillary: 283 mg/dL — ABNORMAL HIGH (ref 70–99)

## 2018-09-30 LAB — CBC
HCT: 32.1 % — ABNORMAL LOW (ref 36.0–46.0)
Hemoglobin: 10.3 g/dL — ABNORMAL LOW (ref 12.0–15.0)
MCH: 30.7 pg (ref 26.0–34.0)
MCHC: 32.1 g/dL (ref 30.0–36.0)
MCV: 95.5 fL (ref 80.0–100.0)
Platelets: 357 10*3/uL (ref 150–400)
RBC: 3.36 MIL/uL — ABNORMAL LOW (ref 3.87–5.11)
RDW: 11.9 % (ref 11.5–15.5)
WBC: 7.2 10*3/uL (ref 4.0–10.5)
nRBC: 0 % (ref 0.0–0.2)

## 2018-09-30 LAB — PROTIME-INR
INR: 1.15
Prothrombin Time: 14.6 seconds (ref 11.4–15.2)

## 2018-09-30 MED ORDER — PROMETHAZINE HCL 25 MG RE SUPP: 25.0000 mg | Freq: Four times a day (QID) | RECTAL | 0 refills | Status: DC | PRN

## 2018-09-30 MED ORDER — CEPHALEXIN 250 MG PO CAPS
250.0000 mg | ORAL_CAPSULE | Freq: Two times a day (BID) | ORAL | Status: DC
  Administered 2018-09-30: 250 mg via ORAL
  Filled 2018-09-30 (×2): qty 1

## 2018-09-30 MED ORDER — INSULIN ASPART 100 UNIT/ML ~~LOC~~ SOLN
2.0000 [IU] | Freq: Three times a day (TID) | SUBCUTANEOUS | Status: DC
  Administered 2018-09-30: 2 [IU] via SUBCUTANEOUS
  Filled 2018-09-30: qty 1

## 2018-09-30 MED ORDER — INSULIN GLARGINE 100 UNIT/ML ~~LOC~~ SOLN
8.0000 [IU] | Freq: Every day | SUBCUTANEOUS | Status: DC
  Filled 2018-09-30: qty 0.08

## 2018-09-30 MED ORDER — BUTALBITAL-APAP-CAFFEINE 50-325-40 MG PO TABS: 0.5000 | ORAL_TABLET | Freq: Two times a day (BID) | ORAL | 0 refills | Status: AC | PRN

## 2018-09-30 MED ORDER — WARFARIN SODIUM 5 MG PO TABS: 7.5000 mg | ORAL_TABLET | Freq: Every evening | ORAL | 0 refills | Status: DC

## 2018-09-30 MED ORDER — LORAZEPAM 0.5 MG PO TABS: 0.5000 mg | ORAL_TABLET | Freq: Four times a day (QID) | ORAL | 0 refills | Status: DC | PRN

## 2018-09-30 MED ORDER — HYDROCODONE-ACETAMINOPHEN 10-325 MG PO TABS: 1.0000 | ORAL_TABLET | Freq: Four times a day (QID) | ORAL | 0 refills | Status: DC | PRN

## 2018-09-30 NOTE — Progress Notes (Addendum)
Inpatient Diabetes Program Recommendations  AACE/ADA: New Consensus Statement on Inpatient Glycemic Control (2015)  Target Ranges:  Prepandial:   less than 140 mg/dL      Peak postprandial:   less than 180 mg/dL (1-2 hours)      Critically ill patients:  140 - 180 mg/dL   Results for Erin Daniels, Erin Daniels (MRN [id number]) as of 09/30/2018 07:41  Ref. Range 09/29/2018 16:34 09/29/2018 21:07  Glucose-Capillary Latest Ref Range: 70 - 99 mg/dL 262 (H)  5 units NOVOLOG  219 (H)  2 units NOVOLOG +  12 units LANTUS    Results for Erin Daniels, Erin Daniels (MRN [id number]) as of 09/30/2018 07:41  Ref. Range 09/30/2018 04:05  Glucose Latest Ref Range: 70 - 99 mg/dL 47 (L)    Admit with: Epistaxis/ Rectal Bleeding  History: Type 1 DM, CVA  SNF DM Meds: Lantus 12 units QHS     Novolog 0-15 units TID per SSI  Current Orders: Lantus 12 units QHS       Novolog Sensitive Correction Scale/ SSI (0-9 units) TID AC + HS      MD- Patient with Hypoglycemia this AM (lab glucose 47 mg/dl on 4am BMET).  Patient received 12 units Lantus last PM.  Was only getting about 7 units basal insulin on her insulin pump when she was using the pump prior to last hospital admission on 09/15/2018.  Please consider the following in-hospital insulin adjustments:  1. Reduce Lantus to 8 units QHS   2. Start low dose Novolog Meal Coverage: Novolog 2 units TID with meals  (Please add the following Hold Parameters: Hold if pt eats <50% of meal, Hold if pt NPO)     Patient was using Insulin Pump at home prior to discharge to SNF on 09/18/2018.  Was discharged to SNF on Lantus 8 units Daily + Novolog 5 units TID with meals.  Endocrinologist: Dr. Frederik Pear with [facility]--last seen 06/17/2018.  Insulin Pump settings at that visit were as follows: Omnipod Basal: MN 0.25; 5a 0.3; noon 0.4; 4p 0.3; 7p 0.2; I/C: mn 1:25; 7AM 1:10; 10am 1:5 Insulin On Board: 4 ISF: 80 BG target 120 TDD Basal: 6.85    --Will  follow patient during hospitalization--  Wyn Quaker RN, MSN, CDE Diabetes Coordinator Inpatient Glycemic Control Team Team Pager: [phone] (8a-5p)

## 2018-09-30 NOTE — Progress Notes (Signed)
FSBS 47, OJ given per protocol

## 2018-09-30 NOTE — Progress Notes (Signed)
Called report to [place] at [place]. Medical ID bracelet taken by spouse. Answered all questions. EMS called for transport

## 2018-09-30 NOTE — Progress Notes (Signed)
Location:   [facility] Room Number: [place] of Service:  SNF (31)   CODE STATUS: DNR  Allergies  Allergen Reactions  . Codeine Nausea Only and Other (See Comments)    Reaction: stress attacks/ headache Can take Vicodin and Percocet   . Meperidine Nausea Only and Other (See Comments)    Reaction: stress attacks/ headache  . Metoclopramide Hcl Nausea Only and Other (See Comments)    Reaction: stress attacks/ headache  . Oxycodone Nausea Only and Other (See Comments)    Reaction: stress attacks/ headache  . Stadol [Butorphanol] Nausea Only and Other (See Comments)    Reaction: stress attacks/ headache  . Tussionex Pennkinetic Er [Hydrocod Polst-Cpm Polst Er] Other (See Comments)    Filled patient with sugar   . Carbocaine  [Mepivacaine Hcl] Palpitations    Chief Complaint  Patient presents with  . Hospitalization Follow-up    Hospital Follow up    HPI:  She is a 73 year old woman who was originally admitted to this facility for short term rehab after suffered a pelvic fracture. She was taken to the ED due to bleeding from nose and mouth. She was hospitalized due to INR >10. She was given IV vit K in the ED.  Her hgb remained stable. She will need INR monitored daily with a goal of 2-3. Per her discharge note she is on 7.5 mg coumadin daily. Her spouse states he was told that she is to be on 5 mg daily. I cannot find any documentation to support this dose. She was taking 7.5 mg prior to her fracture. At this time I will not be making any changes in her coumadin dose. Her goal remains to return back home. She denies any uncontrolled pain; no changes in appetite; no abnormal bleeding; no anxiety or insomnia. She will continue to be followed for her chronic illnesses including: diabetes; dyslipidemia; gastroparesis.   Past Medical History:  Diagnosis Date  . At high risk for falls   . Basal cell carcinoma    right upper leg  . Complication of  anesthesia    difficult to arouse  . Diabetes mellitus without complication (HCC)    Type I  . Diabetic neuropathy ([place])   . Diabetic retinopathy ([place])   . Dizziness   . Gastroparesis   . Herniated disc, cervical   . Hypercholesteremia   . Hypertension   . Hypotensive episode   . Hypothyroidism   . Insulin pump in place   . Myofascial muscle pain   . Numbness of arm    left  . Osteoporosis   . Poor vision   . Seizures ([place])   . Slurred speech   . Stroke (cerebrum) ([place])    4/19  . Stroke (HCC)    x5  . Vertigo   . Wears glasses     Past Surgical History:  Procedure Laterality Date  . APPENDECTOMY    . AUGMENTATION MAMMAPLASTY Bilateral 2011  . BILATERAL CARPAL TUNNEL RELEASE    . COLONOSCOPY    . EYE SURGERY    . HEMORROIDECTOMY    . HERNIA REPAIR    . JOINT REPLACEMENT    . OPEN REDUCTION INTERNAL FIXATION (ORIF) DISTAL RADIAL FRACTURE Right 05/29/2015   Procedure: OPEN REDUCTION INTERNAL FIXATION (ORIF) RIGHT DISTAL RADIUS FRACTURE WITH ALLOGRAFT BONE GRAFTING FOR REPAIR AND RECONSTRUCTION;  Surgeon: Roseanne Kaufman, MD;  Location: [place];  Service: Orthopedics;  Laterality: Right;  . SHOULDER ARTHROSCOPY  Left   . SHOULDER HEMI-ARTHROPLASTY Left   . SYNOVECTOMY    . TONSILLECTOMY    . TRIGGER FINGER RELEASE    . TUBAL LIGATION      Social History   Socioeconomic History  . Marital status: Married    Spouse name: Not on file  . Number of children: Not on file  . Years of education: Not on file  . Highest education level: Not on file  Occupational History  . Not on file  Social Needs  . Financial resource strain: Not on file  . Food insecurity:    Worry: Not on file    Inability: Not on file  . Transportation needs:    Medical: Not on file    Non-medical: Not on file  Tobacco Use  . Smoking status: Never Smoker  . Smokeless tobacco: Never Used  Substance and Sexual Activity  . Alcohol use: No  . Drug use: No  . Sexual activity: Not on file    Lifestyle  . Physical activity:    Days per week: Not on file    Minutes per session: Not on file  . Stress: Not on file  Relationships  . Social connections:    Talks on phone: Not on file    Gets together: Not on file    Attends religious service: Not on file    Active member of club or organization: Not on file    Attends meetings of clubs or organizations: Not on file    Relationship status: Not on file  . Intimate partner violence:    Fear of current or ex partner: Not on file    Emotionally abused: Not on file    Physically abused: Not on file    Forced sexual activity: Not on file  Other Topics Concern  . Not on file  Social History Narrative   Lives at home with her husband.  Unsteady gait.  Walks without any assistive device at this time   Family History  Problem Relation Age of Onset  . Hypertension Mother   . Stroke Mother   . Heart failure Father       VITAL SIGNS BP (!) 161/70 Comment: Discharge summary  Pulse (!) 102 Comment: discharge summary  Temp 99.4 F (37.4 C) Comment: discharge summary  Resp 16 Comment: Discharge Summary  Ht 5' 2"  (1.575 m) Comment: Epic  Wt 100 lb 10 oz (45.6 kg) Comment: Epic  SpO2 96% Comment: Discharge Summary  BMI 18.40 kg/m   Outpatient Encounter Medications as of 09/30/2018  Medication Sig  . acetaminophen (TYLENOL) 325 MG tablet Take 2 tablets (650 mg total) by mouth every 6 (six) hours as needed for headache. For mild pain  . aspirin EC 325 MG tablet Take 325 mg by mouth 2 (two) times daily.   Marland Kitchen atorvastatin (LIPITOR) 40 MG tablet Take 40 mg by mouth daily.  . butalbital-acetaminophen-caffeine (FIORICET, ESGIC) 50-325-40 MG tablet Take 0.5-1 tablets by mouth 2 (two) times daily as needed for up to 4 days for headache.  . Calcium Carb-Cholecalciferol (CALCIUM 600 + D) 600-200 MG-UNIT TABS Take 1 tablet by mouth every evening.  . carboxymethylcellulose (REFRESH PLUS) 0.5 % SOLN Place 1 drop into both eyes as needed (for  dry eyes).   . cetirizine (ZYRTEC) 10 MG tablet Take 10 mg by mouth daily.  . fentaNYL (DURAGESIC) 25 MCG/HR Place 1 patch onto the skin every 3 (three) days.  Marland Kitchen gabapentin (NEURONTIN) 300 MG capsule Give 3 tablets (  900 mg) by mouth at bedtime  . gabapentin (NEURONTIN) 300 MG capsule Take 300 mg by mouth 2 (two) times daily.  . Garlic 2482 MG CAPS Take 1 capsule by mouth daily.   Marland Kitchen glucose 4 GM chewable tablet Chew 1 tablet by mouth as needed for low blood sugar. <65  . insulin aspart (NOVOLOG FLEXPEN) 100 UNIT/ML FlexPen Inject 0-15 units subcutaneous per sliding scale before meals and at bedtime 0 - 100 units = 0 units 101 - 120 = 1 unit 121 - 150 = 2 units 151 - 200 = 4 units 201 - 250 = 6 units 251 - 300 = 8 units 301 - 350 = 10 units 351 - 400 = 12 units 401 - 450 = 15 units > 450 call MD  . insulin aspart (NOVOLOG FLEXPEN) 100 UNIT/ML FlexPen Inject into the skin daily. Inject subcutaneous per sliding scale 0 - 100 = 0 units 101 - 120 = 1 unit 121 - 150 = 2 units 151 - 200 = 4 units 201 - 250 = 6 units 251 - 300 = 8 units 301 - 350 = 10 units 351 - 400 = 12 units  . Insulin Glargine (LANTUS SOLOSTAR) 100 UNIT/ML Solostar Pen Inject 12 Units into the skin at bedtime.  Marland Kitchen linaclotide (LINZESS) 72 MCG capsule Take 72 mcg by mouth daily.  Marland Kitchen LORazepam (ATIVAN) 0.5 MG tablet Take 1 tablet (0.5 mg total) by mouth every 6 (six) hours as needed for up to 10 days for anxiety.  Marland Kitchen losartan (COZAAR) 50 MG tablet Take 50 mg by mouth daily.   . Multiple Vitamins-Minerals (CEROVITE ADVANCED FORMULA PO) Take 1 tablet by mouth daily.  . polyethylene glycol (MIRALAX / GLYCOLAX) packet Take 17 g by mouth daily.   . promethazine (PHENERGAN) 25 MG suppository Place 1 suppository (25 mg total) rectally every 6 (six) hours as needed for up to 4 days for nausea or vomiting.  . senna (SENOKOT) 8.6 MG TABS tablet Take 2 tablets by mouth 2 (two) times daily.  Marland Kitchen venlafaxine (EFFEXOR) 75 MG tablet Take 75  mg by mouth 2 (two) times daily.  . vitamin C (ASCORBIC ACID) 500 MG tablet Take 500 mg by mouth 2 (two) times daily.  Marland Kitchen warfarin (COUMADIN) 5 MG tablet Take 1.5 tablets (7.5 mg total) by mouth every evening.  . Cholecalciferol (VITAMIN D-3) 5000 units TABS Take 5,000 Units by mouth daily.  Marland Kitchen HYDROcodone-acetaminophen (NORCO) 10-325 MG tablet Take 1 tablet by mouth every 6 (six) hours as needed for up to 4 days.     SIGNIFICANT DIAGNOSTIC EXAMS  PREVIOUS:   09-15-18: ct of pelvis:  1. Acute appearing bilateral sacral ala fractures. Linear sclerosis within the mid to distal sacral body on the right side, suspicious for subacute sacral fracture. 2. Acute nondisplaced right transverse process fracture  09-16-18: MRI of lumbar spine:  1. Scoliosis and degenerative lumbar spondylosis. 2. No acute lumbar compression fractures. 3. Bilateral sacral fractures. 4. Mild bilateral lateral recess stenosis and left foraminal stenosis at L1-2 and L2-3. 5. Moderate to moderately severe multifactorial spinal, bilateral lateral recess and foraminal stenosis at L4-5.  NO NEW EXAMS.   LABS REVIEWED PREVIOUS:   05-29-18: hgb a1c 6.9; chol 148; ldl 46; trig 88; hdl 84  09-15-18: wbc 9.7; hgb 12.0; hct 362 mcv 93.3; plt 299; glucose 352; bun 18; creat 0.55; k+ 4.0; na++ 137; ca 8.9 alk phos 176; albumin 3.7  09-18-18: wbc 7.8; hgb 11.7; hct 35.5; mcv 93.4 ;  plt 280  INR 2.79   TODAY:   09-19-18:  INR 2.37 09-29-18: wbc 9.7; hgb 10.7; hct 33.2; mcv 94.9 plt 378; glucose 99; bun 17; creat 0.50; k+ 3.9; na++ 136; ca 8.7; ast 46; alk phos 152; albumin 3.0 INR >10.0 09-30-18: wbc 7.2; hgb 10.3; hc6 32.1; mcv 95.5; plt  357; glucose 47; bun 9; creat 0.47; k+ 4.1; na++ 139; ca 9.0 INR 1.15    Review of Systems  Constitutional: Negative for malaise/fatigue.  Respiratory: Negative for cough and shortness of breath.   Cardiovascular: Negative for chest pain, palpitations and leg swelling.  Gastrointestinal:  Negative for abdominal pain, constipation and heartburn.  Musculoskeletal: Positive for joint pain. Negative for back pain and myalgias.       Tail bone pain   Skin: Negative.   Neurological: Negative for dizziness.  Psychiatric/Behavioral: The patient is not nervous/anxious.     Physical Exam Constitutional:      General: She is not in acute distress.    Appearance: She is well-developed. She is not diaphoretic.     Comments: Frail   Eyes:     Comments: Poor vision both eyes; diabetic retinopathy  Neck:     Musculoskeletal: Neck supple.     Thyroid: No thyromegaly.  Cardiovascular:     Rate and Rhythm: Normal rate and regular rhythm.     Pulses: Normal pulses.     Heart sounds: Normal heart sounds.  Pulmonary:     Effort: Pulmonary effort is normal. No respiratory distress.     Breath sounds: Normal breath sounds.  Abdominal:     General: Bowel sounds are normal. There is no distension.     Palpations: Abdomen is soft.     Tenderness: There is no abdominal tenderness.  Musculoskeletal:     Right lower leg: No edema.     Left lower leg: No edema.     Comments: Is able to move all extremities  History of right radial ORIF     Lymphadenopathy:     Cervical: No cervical adenopathy.  Skin:    General: Skin is warm and dry.     Comments:  History of bilateral breast implants    Neurological:     Mental Status: She is alert and oriented to person, place, and time.      ASSESSMENT/ PLAN:  TODAY:   1. Hypertension associated with type 1 diabetes mellitus: is stable b/p 161/70: will continue asa 325 mg twice daily and cozar 50 mg daily   2. Diabetic polyneuropathy associated with type 1 diabetes mellitus: is stable will continue gabapentin 300 mg twice daily and 900 mg nightly   3. Dyslipidemia due to type 1 diabetes mellitus: is stable LDL 46 will continue lipitor 40 mg daily   4. 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 Uses pump at home. Will continue lantus to 12 units nightly and  novolog  SSI ac and hs and 3 AM : 0-100: 0 units; 101-120: 1 units; 121-150: 2 units; 151-200: 4 units; 201-250: 6 units; 251-300: 8 units; 301-350: 10 units; 351-400: 12 units; 401-450: 15 units: >450 call MD   4. Acute anxiety: is stable will continue ativan 0.5 mg every 6 hours as needed through 10-10-18.   5. Anemia of chronic disease: is stable hgb 10.3 will continue iron daily   6. Chronic migraine without aura: is stable will Fioricet 1/2 to 1 tab twice daily as needed through 10-04-18  7.  Chronic non-seasonal allergic rhinitis: is stable will continue zyrtec 10 mg daily   8. Major depression recurrent chronic: is stable will continue effexor 75 mg twice daily   9. Diabetic gastroparesis associated with type 1 diabetes mellitus: is stable will monitor  10. Chronic cerebrovascular accident: is stable is on chronic coumadin therapy 7.5 mg daily INR is pending. She has been ordered INR daily will not make changes will continue to monitor her status,   11.  Bilateral sacral fracture: will continue therapy as directed and will follow up with orthopedics as indicated will continue fentanyl 25 mcg patch every 3 days; will begin vicodin 10/325 mg every 6 hours as needed through 10-14-18    12.  Chronic pain syndrome with degenerative lumbar spondylosis: is stable will continue her fentanyl 25 mcg patch every 3 days.   13. Chronic constipation: is stable; she desires to make her medication as needed will make the following changes to: linzezz 72 mcg daily as needed miralax daily as needed and senna 2 tabs twice daily as needed.   14. Chronic CVA: is neurologically stable: is on long term coumadin therapy and asa.    MD is aware of resident's narcotic use and is in agreement with current plan of care. We will attempt to wean resident as apropriate   Ok Edwards NP [facility]  Contact [phone] Monday  through Friday 8am- 5pm  After hours call [phone]

## 2018-09-30 NOTE — Discharge Summary (Addendum)
[facility] [place] [facility] NAME: Erin Daniels    MR#:  [id number]  DATE OF BIRTH:  October 17, 1945  DATE OF ADMISSION:  09/29/2018 ADMITTING PHYSICIAN: Demetrios Loll, MD  DATE OF DISCHARGE: 09/30/2018  PRIMARY CARE PHYSICIAN: Juluis Pitch, MD    ADMISSION DIAGNOSIS:  Epistaxis [R04.0] Rectal bleeding [K62.5] Bleeding on Coumadin [R58, T45.515A]  DISCHARGE DIAGNOSIS:  Active Problems:   Hypercoagulopathy ([place])   SECONDARY DIAGNOSIS:   Past Medical History:  Diagnosis Date  . At high risk for falls   . Basal cell carcinoma    right upper leg  . Complication of anesthesia    difficult to arouse  . Diabetes mellitus without complication (HCC)    Type I  . Diabetic neuropathy ([place])   . Diabetic retinopathy ([place])   . Dizziness   . Gastroparesis   . Herniated disc, cervical   . Hypercholesteremia   . Hypertension   . Hypotensive episode   . Hypothyroidism   . Insulin pump in place   . Myofascial muscle pain   . Numbness of arm    left  . Osteoporosis   . Poor vision   . Seizures ([place])   . Slurred speech   . 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 Stroke (HCC)    x5  . Vertigo   . Wears glasses     HOSPITAL COURSE:  73 year old female on Coumadin for previous strokes and diabetes who presented to the emergency room with epistaxis and found to have an elevated INR.  1.  Epistaxis with rectal bleeding due to epistaxis running through her GI tract: Patient's hemoglobin has been stable.  Patient was given vitamin K in ER.  Epistaxis is resolved.  Patient will have outpatient follow-up with her primary care physician. Patient will continue on Coumadin with close monitoring of her INR with goal 2-3.She should have INR checked daily while at [facility].  2.  Diabetes: Patient had a low blood sugar however she is now eating she will continue outpatient regimen and ADA diet  3.  History of CVA: Patient will continue on outpatient regimen including  Coumadin, aspirin and statin. 4.  Essential hypertension: Continue losartan  DISCHARGE CONDITIONS AND DIET:   Stable for discharge on diabetic heart healthy diet  CONSULTS OBTAINED:    DRUG ALLERGIES:   Allergies  Allergen Reactions  . Codeine Nausea Only and Other (See Comments)    Reaction: stress attacks/ headache Can take Vicodin and Percocet   . Meperidine Nausea Only and Other (See Comments)    Reaction: stress attacks/ headache  . Metoclopramide Hcl Nausea Only and Other (See Comments)    Reaction: stress attacks/ headache  . Oxycodone Nausea Only and Other (See Comments)    Reaction: stress attacks/ headache  . Stadol [Butorphanol] Nausea Only and Other (See Comments)    Reaction: stress attacks/ headache  . Tussionex Pennkinetic Er [Hydrocod Polst-Cpm Polst Er] Other (See Comments)    Filled patient with sugar   . Carbocaine  [Mepivacaine Hcl] Palpitations    DISCHARGE MEDICATIONS:   Allergies as of 09/30/2018      Reactions   Codeine Nausea Only, Other (See Comments)   Reaction: stress attacks/ headache Can take Vicodin and Percocet    Meperidine Nausea Only, Other (See Comments)   Reaction: stress attacks/ headache   Metoclopramide Hcl Nausea Only, Other (See Comments)   Reaction: stress attacks/ headache   Oxycodone Nausea Only, Other (See Comments)  Reaction: stress attacks/ headache   Stadol [butorphanol] Nausea Only, Other (See Comments)   Reaction: stress attacks/ headache   Tussionex Pennkinetic Er [hydrocod Polst-cpm Polst Er] Other (See Comments)   Filled patient with sugar    Carbocaine  [mepivacaine Hcl] Palpitations      Medication List    TAKE these medications   acetaminophen 325 MG tablet Commonly known as:  TYLENOL Take 2 tablets (650 mg total) by mouth every 6 (six) hours as needed for headache. For mild pain   aspirin EC 325 MG tablet Take 325 mg by mouth 2 (two) times daily.   atorvastatin 40 MG tablet Commonly known as:   LIPITOR Take 40 mg by mouth daily.   butalbital-acetaminophen-caffeine 50-325-40 MG tablet Commonly known as:  FIORICET, ESGIC Take 0.5-1 tablets by mouth 2 (two) times daily as needed for headache.   CALCIUM 600 + D 600-200 MG-UNIT Tabs Generic drug:  Calcium Carb-Cholecalciferol Take 1 tablet by mouth every evening.   carboxymethylcellulose 0.5 % Soln Commonly known as:  REFRESH PLUS Place 1 drop into both eyes as needed (for dry eyes).   CEROVITE ADVANCED FORMULA PO Take 1 tablet by mouth daily.   cetirizine 10 MG tablet Commonly known as:  ZYRTEC Take 10 mg by mouth daily.   fentaNYL 25 MCG/HR Commonly known as:  [place] 1 patch onto the skin every 3 (three) days.   gabapentin 300 MG capsule Commonly known as:  NEURONTIN Take 300 mg by mouth 2 (two) times daily.   gabapentin 300 MG capsule Commonly known as:  NEURONTIN Give 3 tablets (900 mg) by mouth at bedtime   Garlic 6213 MG Caps Take 1 capsule by mouth daily.   glucose 4 GM chewable tablet Chew 1 tablet by mouth as needed for low blood sugar. <65   HYDROcodone-acetaminophen 5-325 MG tablet Commonly known as:  NORCO/VICODIN Take 1 tablet by mouth every 6 (six) hours as needed for moderate pain.   LANTUS SOLOSTAR 100 UNIT/ML Solostar Pen Generic drug:  Insulin Glargine Inject 12 Units into the skin at bedtime.   LINZESS 72 MCG capsule Generic drug:  linaclotide Take 72 mcg by mouth daily.   LORazepam 0.5 MG tablet Commonly known as:  ATIVAN Take 0.5 mg by mouth every 6 (six) hours as needed for anxiety.   losartan 50 MG tablet Commonly known as:  COZAAR Take 50 mg by mouth daily.   NOVOLOG FLEXPEN 100 UNIT/ML FlexPen Generic drug:  insulin aspart Inject into the skin daily. Inject subcutaneous per sliding scale 0 - 100 = 0 units 101 - 120 = 1 unit 121 - 150 = 2 units 151 - 200 = 4 units 201 - 250 = 6 units 251 - 300 = 8 units 301 - 350 = 10 units 351 - 400 = 12 units   NOVOLOG  FLEXPEN 100 UNIT/ML FlexPen Generic drug:  insulin aspart Inject 0-15 units subcutaneous per sliding scale before meals and at bedtime 0 - 100 units = 0 units 101 - 120 = 1 unit 121 - 150 = 2 units 151 - 200 = 4 units 201 - 250 = 6 units 251 - 300 = 8 units 301 - 350 = 10 units 351 - 400 = 12 units 401 - 450 = 15 units > 450 call MD   polyethylene glycol packet Commonly known as:  MIRALAX / GLYCOLAX Take 17 g by mouth daily.   promethazine 25 MG suppository Commonly known as:  PHENERGAN Place 25 mg  rectally every 6 (six) hours as needed for nausea or vomiting.   senna 8.6 MG Tabs tablet Commonly known as:  SENOKOT Take 2 tablets by mouth 2 (two) times daily.   venlafaxine 75 MG tablet Commonly known as:  EFFEXOR Take 75 mg by mouth 2 (two) times daily.   vitamin C 500 MG tablet Commonly known as:  ASCORBIC ACID Take 500 mg by mouth 2 (two) times daily.   Vitamin D-3 125 MCG (5000 UT) Tabs Take 5,000 Units by mouth daily.   warfarin 5 MG tablet Commonly known as:  COUMADIN Take 1.5 tablets (7.5 mg total) by mouth every evening.         Today   CHIEF COMPLAINT:  Patient doing better this morning.  No acute issues overnight.  No epistaxis   VITAL SIGNS:  Blood pressure (!) 161/70, pulse (!) 102, temperature 99.4 F (37.4 C), temperature source Oral, resp. rate 16, height 5\' 2"  (1.575 m), weight 45 kg, SpO2 96 %.   REVIEW OF SYSTEMS:  Review of Systems  Constitutional: Negative.  Negative for chills, fever and malaise/fatigue.  HENT: Negative.  Negative for ear discharge, ear pain, hearing loss, nosebleeds and sore throat.   Eyes: Negative.  Negative for blurred vision and pain.  Respiratory: Negative.  Negative for cough, hemoptysis, shortness of breath and wheezing.   Cardiovascular: Negative.  Negative for chest pain, palpitations and leg swelling.  Gastrointestinal: Negative.  Negative for abdominal pain, blood in stool, diarrhea, nausea and vomiting.   Genitourinary: Negative.  Negative for dysuria.  Musculoskeletal: Negative.  Negative for back pain.  Skin: Negative.   Neurological: Negative for dizziness, tremors, speech change, focal weakness, seizures and headaches.  Endo/Heme/Allergies: Negative.  Does not bruise/bleed easily.  Psychiatric/Behavioral: Negative.  Negative for depression, hallucinations and suicidal ideas.     PHYSICAL EXAMINATION:  GENERAL:  73 y.o.-year-old patient lying in the bed with no acute distress.  NECK:  Supple, no jugular venous distention. No thyroid enlargement, no tenderness.  LUNGS: Normal breath sounds bilaterally, no wheezing, rales,rhonchi  No use of accessory muscles of respiration.  CARDIOVASCULAR: S1, S2 normal. No murmurs, rubs, or gallops.  ABDOMEN: Soft, non-tender, non-distended. Bowel sounds present. No organomegaly or mass.  EXTREMITIES: No pedal edema, cyanosis, or clubbing.  PSYCHIATRIC: The patient is alert and oriented x 3.  SKIN: No obvious rash, lesion, or ulcer.   DATA REVIEW:   CBC Recent Labs  Lab 09/30/18 0405  WBC 7.2  HGB 10.3*  HCT 32.1*  PLT 357    Chemistries  Recent Labs  Lab 09/29/18 0751 09/30/18 0405  NA 136 139  K 3.9 4.1  CL 99 102  CO2 31 31  GLUCOSE 99 47*  BUN 17 9  CREATININE 0.50 0.47  CALCIUM 8.7* 9.0  AST 46*  --   ALT 28  --   ALKPHOS 152*  --   BILITOT 0.5  --     Cardiac Enzymes No results for input(s): TROPONINI in the last 168 hours.  Microbiology Results  @MICRORSLT48 @  RADIOLOGY:  No results found.    Allergies as of 09/30/2018      Reactions   Codeine Nausea Only, Other (See Comments)   Reaction: stress attacks/ headache Can take Vicodin and Percocet    Meperidine Nausea Only, Other (See Comments)   Reaction: stress attacks/ headache   Metoclopramide Hcl Nausea Only, Other (See Comments)   Reaction: stress attacks/ headache   Oxycodone Nausea Only, Other (See Comments)  Reaction: stress attacks/ headache    Stadol [butorphanol] Nausea Only, Other (See Comments)   Reaction: stress attacks/ headache   Tussionex Pennkinetic Er [hydrocod Polst-cpm Polst Er] Other (See Comments)   Filled patient with sugar    Carbocaine  [mepivacaine Hcl] Palpitations      Medication List    TAKE these medications   acetaminophen 325 MG tablet Commonly known as:  TYLENOL Take 2 tablets (650 mg total) by mouth every 6 (six) hours as needed for headache. For mild pain   aspirin EC 325 MG tablet Take 325 mg by mouth 2 (two) times daily.   atorvastatin 40 MG tablet Commonly known as:  LIPITOR Take 40 mg by mouth daily.   butalbital-acetaminophen-caffeine 50-325-40 MG tablet Commonly known as:  FIORICET, ESGIC Take 0.5-1 tablets by mouth 2 (two) times daily as needed for headache.   CALCIUM 600 + D 600-200 MG-UNIT Tabs Generic drug:  Calcium Carb-Cholecalciferol Take 1 tablet by mouth every evening.   carboxymethylcellulose 0.5 % Soln Commonly known as:  REFRESH PLUS Place 1 drop into both eyes as needed (for dry eyes).   CEROVITE ADVANCED FORMULA PO Take 1 tablet by mouth daily.   cetirizine 10 MG tablet Commonly known as:  ZYRTEC Take 10 mg by mouth daily.   fentaNYL 25 MCG/HR Commonly known as:  [place] 1 patch onto the skin every 3 (three) days.   gabapentin 300 MG capsule Commonly known as:  NEURONTIN Take 300 mg by mouth 2 (two) times daily.   gabapentin 300 MG capsule Commonly known as:  NEURONTIN Give 3 tablets (900 mg) by mouth at bedtime   Garlic 4270 MG Caps Take 1 capsule by mouth daily.   glucose 4 GM chewable tablet Chew 1 tablet by mouth as needed for low blood sugar. <65   HYDROcodone-acetaminophen 5-325 MG tablet Commonly known as:  NORCO/VICODIN Take 1 tablet by mouth every 6 (six) hours as needed for moderate pain.   LANTUS SOLOSTAR 100 UNIT/ML Solostar Pen Generic drug:  Insulin Glargine Inject 12 Units into the skin at bedtime.   LINZESS 72 MCG  capsule Generic drug:  linaclotide Take 72 mcg by mouth daily.   LORazepam 0.5 MG tablet Commonly known as:  ATIVAN Take 0.5 mg by mouth every 6 (six) hours as needed for anxiety.   losartan 50 MG tablet Commonly known as:  COZAAR Take 50 mg by mouth daily.   NOVOLOG FLEXPEN 100 UNIT/ML FlexPen Generic drug:  insulin aspart Inject into the skin daily. Inject subcutaneous per sliding scale 0 - 100 = 0 units 101 - 120 = 1 unit 121 - 150 = 2 units 151 - 200 = 4 units 201 - 250 = 6 units 251 - 300 = 8 units 301 - 350 = 10 units 351 - 400 = 12 units   NOVOLOG FLEXPEN 100 UNIT/ML FlexPen Generic drug:  insulin aspart Inject 0-15 units subcutaneous per sliding scale before meals and at bedtime 0 - 100 units = 0 units 101 - 120 = 1 unit 121 - 150 = 2 units 151 - 200 = 4 units 201 - 250 = 6 units 251 - 300 = 8 units 301 - 350 = 10 units 351 - 400 = 12 units 401 - 450 = 15 units > 450 call MD   polyethylene glycol packet Commonly known as:  MIRALAX / GLYCOLAX Take 17 g by mouth daily.   promethazine 25 MG suppository Commonly known as:  PHENERGAN Place 25 mg  rectally every 6 (six) hours as needed for nausea or vomiting.   senna 8.6 MG Tabs tablet Commonly known as:  SENOKOT Take 2 tablets by mouth 2 (two) times daily.   venlafaxine 75 MG tablet Commonly known as:  EFFEXOR Take 75 mg by mouth 2 (two) times daily.   vitamin C 500 MG tablet Commonly known as:  ASCORBIC ACID Take 500 mg by mouth 2 (two) times daily.   Vitamin D-3 125 MCG (5000 UT) Tabs Take 5,000 Units by mouth daily.   warfarin 5 MG tablet Commonly known as:  COUMADIN Take 1.5 tablets (7.5 mg total) by mouth every evening.         Management plans discussed with the patient and she is in agreement. Stable for discharge   Patient should follow up with pcp  CODE STATUS:     Code Status Orders  (From admission, onward)         Start     Ordered   09/29/18 1202  Do not attempt  resuscitation (DNR)  Continuous    Question Answer Comment  In the event of cardiac or respiratory ARREST Do not call a "code blue"   In the event of cardiac or respiratory ARREST Do not perform Intubation, CPR, defibrillation or ACLS   In the event of cardiac or respiratory ARREST Use medication by any route, position, wound care, and other measures to relive pain and suffering. May use oxygen, suction and manual treatment of airway obstruction as needed for comfort.      09/29/18 1201        Code Status History    Date Active Date Inactive Code Status Order ID Comments User Context   09/15/2018 2314 09/18/2018 2150 Full Code [id number]  Quintella Baton, MD Inpatient   11/11/2017 0648 11/13/2017 1700 Full Code [id number]  Harrie Foreman, MD Inpatient   11/26/2016 2325 11/30/2016 1850 Full Code [id number]  Sid Falcon, MD ED   05/29/2015 1352 05/31/2015 1522 Full Code [id number]  Roseanne Kaufman, MD Inpatient    Advance Directive Documentation     Most Recent Value  Type of Advance Directive  Healthcare Power of [place], Out of facility DNR (pink MOST or yellow form)  Pre-existing out of facility DNR order (yellow form or pink MOST form)  -  "MOST" Form in Place?  -      TOTAL TIME TAKING CARE OF THIS PATIENT: 38 minutes.    Note: This dictation was prepared with Dragon dictation along with smaller phrase technology. Any transcriptional errors that result from this process are unintentional.  Brieann Osinski M.D on 09/30/2018 at 10:00 AM  Between 7am to 6pm - Pager - [phone] After 6pm go to [email or web address] - password [email or web address] [facility]  Office  [phone]  CC: Primary care physician; Juluis Pitch, MD

## 2018-09-30 NOTE — Clinical Social Work Note (Signed)
Patient is medically ready for discharge today. CSW notified patient's husband Chele Cornell [phone] of discharge to [facility] today. CSW also notified Lovena Le at [place] of discharge today. Patient will be transported by EMS. RN to call report and call for transport.    [place], [place]

## 2018-10-01 ENCOUNTER — Other Ambulatory Visit
Admission: RE | Admit: 2018-10-01 | Discharge: 2018-10-01 | Disposition: A | Discharge status: Home / Self Care | Payer: Medicare Other | Source: Ambulatory Visit | Attending: Adult Health | Admitting: Adult Health

## 2018-10-01 DIAGNOSIS — Z5181 Encounter for therapeutic drug level monitoring: Secondary | ICD-10-CM | POA: Diagnosis present

## 2018-10-01 LAB — PROTIME-INR
INR: 1.11
INR: 1.17
Prothrombin Time: 14.2 seconds (ref 11.4–15.2)
Prothrombin Time: 14.8 seconds (ref 11.4–15.2)

## 2018-10-02 ENCOUNTER — Inpatient Hospital Stay: Admission: RE | Admit: 2018-10-02 | Payer: Self-pay | Source: Ambulatory Visit

## 2018-10-02 ENCOUNTER — Other Ambulatory Visit
Admission: RE | Admit: 2018-10-02 | Discharge: 2018-10-02 | Disposition: A | Discharge status: Home / Self Care | Payer: Medicare Other | Source: Ambulatory Visit | Attending: Internal Medicine | Admitting: Internal Medicine

## 2018-10-02 DIAGNOSIS — Z7901 Long term (current) use of anticoagulants: Secondary | ICD-10-CM | POA: Diagnosis not present

## 2018-10-02 DIAGNOSIS — Z8673 Personal history of transient ischemic attack (TIA), and cerebral infarction without residual deficits: Secondary | ICD-10-CM | POA: Insufficient documentation

## 2018-10-02 LAB — PROTIME-INR
INR: 1.17
Prothrombin Time: 14.8 seconds (ref 11.4–15.2)

## 2018-10-03 ENCOUNTER — Other Ambulatory Visit
Admission: RE | Admit: 2018-10-03 | Discharge: 2018-10-03 | Disposition: A | Discharge status: Home / Self Care | Payer: Medicare Other | Source: Ambulatory Visit | Attending: Adult Health | Admitting: Adult Health

## 2018-10-03 DIAGNOSIS — Z7901 Long term (current) use of anticoagulants: Secondary | ICD-10-CM | POA: Insufficient documentation

## 2018-10-03 LAB — PROTIME-INR
INR: 1.23
Prothrombin Time: 15.4 seconds — ABNORMAL HIGH (ref 11.4–15.2)

## 2018-10-04 ENCOUNTER — Non-Acute Institutional Stay (SKILLED_NURSING_FACILITY): Payer: Medicare Other | Admitting: Adult Health

## 2018-10-04 ENCOUNTER — Other Ambulatory Visit: Payer: Self-pay | Admitting: Adult Health

## 2018-10-04 ENCOUNTER — Encounter: Payer: Self-pay | Admitting: Adult Health

## 2018-10-04 ENCOUNTER — Other Ambulatory Visit
Admission: RE | Admit: 2018-10-04 | Discharge: 2018-10-04 | Disposition: A | Discharge status: Home / Self Care | Payer: Medicare Other | Source: Skilled Nursing Facility | Attending: Internal Medicine | Admitting: Internal Medicine

## 2018-10-04 DIAGNOSIS — G43709 Chronic migraine without aura, not intractable, without status migrainosus: Secondary | ICD-10-CM

## 2018-10-04 DIAGNOSIS — M8448XS Pathological fracture, other site, sequela: Secondary | ICD-10-CM | POA: Diagnosis not present

## 2018-10-04 MED ORDER — HYDROCODONE-ACETAMINOPHEN 10-325 MG PO TABS: 1.0000 | ORAL_TABLET | Freq: Four times a day (QID) | ORAL | 0 refills | Status: AC | PRN

## 2018-10-04 NOTE — Progress Notes (Signed)
Location:   [place] [facility] Room Number: [place] of Service:  SNF (31)   CODE STATUS: DNR  Allergies  Allergen Reactions  . Codeine Nausea Only and Other (See Comments)    Reaction: stress attacks/ headache Can take Vicodin and Percocet   . Meperidine Nausea Only and Other (See Comments)    Reaction: stress attacks/ headache  . Metoclopramide Hcl Nausea Only and Other (See Comments)    Reaction: stress attacks/ headache  . Oxycodone Nausea Only and Other (See Comments)    Reaction: stress attacks/ headache  . Stadol [Butorphanol] Nausea Only and Other (See Comments)    Reaction: stress attacks/ headache  . Tussionex Pennkinetic Er [Hydrocod Polst-Cpm Polst Er] Other (See Comments)    Filled patient with sugar   . Carbocaine  [Mepivacaine Hcl] Palpitations    Chief Complaint  Patient presents with  . Acute Visit    Pain Management    HPI:  She is status post sacral fracture. She does have pain present and is using prn vicodin 10/325 mg. She also has a duragesic patch. We have discussed the use of narcotics; their overall purpose and use. We have discussed addiction issues. She does not feel as though she is getting relief with the use of duragesic. She has not used phenergan. She uses fioricet  For her chronic headaches. She feels as tough her pain is being managed. She denies any changes in appetite; no anxiety no insomnia.   Past Medical History:  Diagnosis Date  . At high risk for falls   . Basal cell carcinoma    right upper leg  . Complication of anesthesia    difficult to arouse  . Diabetes mellitus without complication (HCC)    Type I  . Diabetic neuropathy ([place])   . Diabetic retinopathy ([place])   . Dizziness   . Gastroparesis   . Herniated disc, cervical   . Hypercholesteremia   . Hypertension   . Hypotensive episode   . Hypothyroidism   . Insulin pump in place   . Myofascial muscle pain   . Numbness of arm    left  .  Osteoporosis   . Poor vision   . Seizures ([place])   . Slurred speech   . 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 Stroke (HCC)    x5  . Vertigo   . Wears glasses     Past Surgical History:  Procedure Laterality Date  . APPENDECTOMY    . AUGMENTATION MAMMAPLASTY Bilateral 2011  . BILATERAL CARPAL TUNNEL RELEASE    . COLONOSCOPY    . EYE SURGERY    . HEMORROIDECTOMY    . HERNIA REPAIR    . JOINT REPLACEMENT    . OPEN REDUCTION INTERNAL FIXATION (ORIF) DISTAL RADIAL FRACTURE Right 05/29/2015   Procedure: OPEN REDUCTION INTERNAL FIXATION (ORIF) RIGHT DISTAL RADIUS FRACTURE WITH ALLOGRAFT BONE GRAFTING FOR REPAIR AND RECONSTRUCTION;  Surgeon: Roseanne Kaufman, MD;  Location: [facility];  Service: Orthopedics;  Laterality: Right;  . SHOULDER ARTHROSCOPY Left   . SHOULDER HEMI-ARTHROPLASTY Left   . SYNOVECTOMY    . TONSILLECTOMY    . TRIGGER FINGER RELEASE    . TUBAL LIGATION      Social History   Socioeconomic History  . Marital status: Married    Spouse name: Not on file  . Number of children: Not on file  . Years of education: Not on file  . Highest education level: Not on  file  Occupational History  . Not on file  Social Needs  . Financial resource strain: Not on file  . Food insecurity:    Worry: Not on file    Inability: Not on file  . Transportation needs:    Medical: Not on file    Non-medical: Not on file  Tobacco Use  . Smoking status: Never Smoker  . Smokeless tobacco: Never Used  Substance and Sexual Activity  . Alcohol use: No  . Drug use: No  . Sexual activity: Not on file  Lifestyle  . Physical activity:    Days per week: Not on file    Minutes per session: Not on file  . Stress: Not on file  Relationships  . Social connections:    Talks on phone: Not on file    Gets together: Not on file    Attends religious service: Not on file    Active member of club or organization: Not on file    Attends meetings of clubs or organizations: Not on file    Relationship  status: Not on file  . Intimate partner violence:    Fear of current or ex partner: Not on file    Emotionally abused: Not on file    Physically abused: Not on file    Forced sexual activity: Not on file  Other Topics Concern  . Not on file  Social History Narrative   Lives at home with her husband.  Unsteady gait.  Walks without any assistive device at this time   Family History  Problem Relation Age of Onset  . Hypertension Mother   . Stroke Mother   . Heart failure Father       VITAL SIGNS BP (!) 147/67   Pulse 84   Temp 97.6 F (36.4 C)   Resp 18   Ht _0  (1.575 m)   Wt 99 lb 9.6 oz (45.2 kg)   SpO2 97%   BMI 18.22 kg/m   Outpatient Encounter Medications as of 10/04/2018  Medication Sig  . acetaminophen (TYLENOL) 325 MG tablet Take 2 tablets (650 mg total) by mouth every 6 (six) hours as needed for headache. For mild pain  . atorvastatin (LIPITOR) 40 MG tablet Take 40 mg by mouth daily.  . butalbital-acetaminophen-caffeine (FIORICET, ESGIC) 50-325-40 MG tablet Take 0.5-1 tablets by mouth 2 (two) times daily as needed for up to 4 days for headache.  . Calcium Carb-Cholecalciferol (CALCIUM 600 + D) 600-200 MG-UNIT TABS Take 1 tablet by mouth every evening.  . carboxymethylcellulose (REFRESH PLUS) 0.5 % SOLN Place 1 drop into both eyes as needed (for dry eyes).   . cetirizine (ZYRTEC) 10 MG tablet Take 10 mg by mouth daily.  . Cholecalciferol (VITAMIN D-3) 5000 units TABS Take 5,000 Units by mouth daily.  . fentaNYL (DURAGESIC) 25 MCG/HR Place 1 patch onto the skin every 3 (three) days.  Marland Kitchen gabapentin (NEURONTIN) 300 MG capsule Give 3 tablets (900 mg) by mouth at bedtime  . gabapentin (NEURONTIN) 300 MG capsule Take 300 mg by mouth 2 (two) times daily.  . Garlic 6378 MG CAPS Take 1 capsule by mouth daily.   Marland Kitchen glucose 4 GM chewable tablet Chew 1 tablet by mouth as needed for low blood sugar. <65  . HYDROcodone-acetaminophen (NORCO) 10-325 MG tablet Take 1 tablet by mouth  every 6 (six) hours as needed for up to 4 days.  . insulin aspart (NOVOLOG FLEXPEN) 100 UNIT/ML FlexPen Inject 0-15 units subcutaneous per sliding scale  before meals and at bedtime 0 - 100 units = 0 units 101 - 120 = 1 unit 121 - 150 = 2 units 151 - 200 = 4 units 201 - 250 = 6 units 251 - 300 = 8 units 301 - 350 = 10 units 351 - 400 = 12 units 401 - 450 = 15 units > 450 call MD  . insulin aspart (NOVOLOG FLEXPEN) 100 UNIT/ML FlexPen Inject into the skin daily. Inject subcutaneous per sliding scale 0 - 100 = 0 units 101 - 120 = 1 unit 121 - 150 = 2 units 151 - 200 = 4 units 201 - 250 = 6 units 251 - 300 = 8 units 301 - 350 = 10 units 351 - 400 = 12 units 401 - 415 = 15 units  . Insulin Glargine (LANTUS SOLOSTAR) 100 UNIT/ML Solostar Pen Inject 12 Units into the skin at bedtime.  Marland Kitchen linaclotide (LINZESS) 72 MCG capsule Take 72 mcg by mouth daily.  Marland Kitchen LORazepam (ATIVAN) 0.5 MG tablet Take 1 tablet (0.5 mg total) by mouth every 6 (six) hours as needed for up to 10 days for anxiety.  Marland Kitchen losartan (COZAAR) 50 MG tablet Take 50 mg by mouth daily.   . Multiple Vitamins-Minerals (CEROVITE ADVANCED FORMULA PO) Take 1 tablet by mouth daily.  . NON FORMULARY Diet type:  NCS  . polyethylene glycol (MIRALAX / GLYCOLAX) packet Take 17 g by mouth daily.   . promethazine (PHENERGAN) 25 MG suppository Place 1 suppository (25 mg total) rectally every 6 (six) hours as needed for up to 4 days for nausea or vomiting.  . senna (SENOKOT) 8.6 MG TABS tablet Take 2 tablets by mouth 2 (two) times daily.  Marland Kitchen venlafaxine (EFFEXOR) 75 MG tablet Take 75 mg by mouth 2 (two) times daily.  . vitamin C (ASCORBIC ACID) 500 MG tablet Take 500 mg by mouth 2 (two) times daily.  Marland Kitchen warfarin (COUMADIN) 5 MG tablet Take 5 mg by mouth daily. Managed by Dr. Ouida Sills  . [DISCONTINUED] aspirin EC 325 MG tablet Take 325 mg by mouth 2 (two) times daily.   . [DISCONTINUED] warfarin (COUMADIN) 5 MG tablet Take 1.5 tablets (7.5 mg total)  by mouth every evening. (Patient not taking: Reported on 10/04/2018)   No facility-administered encounter medications on file as of 10/04/2018.      SIGNIFICANT DIAGNOSTIC EXAMS  PREVIOUS:   09-15-18: ct of pelvis:  1. Acute appearing bilateral sacral ala fractures. Linear sclerosis within the mid to distal sacral body on the right side, suspicious for subacute sacral fracture. 2. Acute nondisplaced right transverse process fracture  09-16-18: MRI of lumbar spine:  1. Scoliosis and degenerative lumbar spondylosis. 2. No acute lumbar compression fractures. 3. Bilateral sacral fractures. 4. Mild bilateral lateral recess stenosis and left foraminal stenosis at L1-2 and L2-3. 5. Moderate to moderately severe multifactorial spinal, bilateral lateral recess and foraminal stenosis at L4-5.  NO NEW EXAMS.   LABS REVIEWED PREVIOUS:   05-29-18: hgb a1c 6.9; chol 148; ldl 46; trig 88; hdl 84  09-15-18: wbc 9.7; hgb 12.0; hct 362 mcv 93.3; plt 299; glucose 352; bun 18; creat 0.55; k+ 4.0; na++ 137; ca 8.9 alk phos 176; albumin 3.7  09-18-18: wbc 7.8; hgb 11.7; hct 35.5; mcv 93.4 ;plt 280  INR 2.79 09-19-18:  INR 2.37 09-29-18: wbc 9.7; hgb 10.7; hct 33.2; mcv 94.9 plt 378; glucose 99; bun 17; creat 0.50; k+ 3.9; na++ 136; ca 8.7; ast 46; alk phos 152; albumin 3.0  INR >10.0 09-30-18: wbc 7.2; hgb 10.3; hc6 32.1; mcv 95.5; plt  357; glucose 47; bun 9; creat 0.47; k+ 4.1; na++ 139; ca 9.0 INR 1.15   NO NEW LABS.   Review of Systems  Constitutional: Negative for malaise/fatigue.  Respiratory: Negative for cough and shortness of breath.   Cardiovascular: Negative for chest pain, palpitations and leg swelling.  Gastrointestinal: Negative for abdominal pain, constipation and heartburn.  Musculoskeletal: Negative for back pain, joint pain and myalgias.       Tail bone pain is being managed   Skin: Negative.   Neurological: Negative for dizziness.  Psychiatric/Behavioral: The patient is not  nervous/anxious.     Physical Exam Constitutional:      General: She is not in acute distress.    Appearance: She is well-developed. She is not diaphoretic.     Comments: Frail   Eyes:     Comments: Poor vision both eyes; diabetic retinopathy   Neck:     Musculoskeletal: Neck supple.     Thyroid: No thyromegaly.  Cardiovascular:     Rate and Rhythm: Normal rate and regular rhythm.     Pulses: Normal pulses.     Heart sounds: Normal heart sounds.  Pulmonary:     Effort: Pulmonary effort is normal. No respiratory distress.     Breath sounds: Normal breath sounds.  Abdominal:     General: Bowel sounds are normal. There is no distension.     Palpations: Abdomen is soft.     Tenderness: There is no abdominal tenderness.  Musculoskeletal:     Right lower leg: No edema.     Left lower leg: No edema.     Comments: Is able to move all extremities  History of right radial ORIF      Lymphadenopathy:     Cervical: No cervical adenopathy.  Skin:    General: Skin is warm and dry.     Comments: History of bilateral breast implants     Neurological:     Mental Status: She is alert and oriented to person, place, and time.  Psychiatric:        Mood and Affect: Mood normal.      ASSESSMENT/ PLAN:  TODAY:   1. Bilateral sacral insufficiency fracture; sequela 2. 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 We will attempt to wean resident as apropriate   Ok Edwards NP [facility]  Contact [phone] Monday through Friday 8am- 5pm  After hours call [phone]

## 2018-10-05 ENCOUNTER — Other Ambulatory Visit
Admission: RE | Admit: 2018-10-05 | Discharge: 2018-10-05 | Disposition: A | Discharge status: Home / Self Care | Payer: Medicare Other | Attending: Internal Medicine | Admitting: Internal Medicine

## 2018-10-05 DIAGNOSIS — I639 Cerebral infarction, unspecified: Secondary | ICD-10-CM | POA: Insufficient documentation

## 2018-10-05 LAB — PROTIME-INR
INR: 1.43
Prothrombin Time: 17.3 seconds — ABNORMAL HIGH (ref 11.4–15.2)

## 2018-10-06 ENCOUNTER — Other Ambulatory Visit
Admission: RE | Admit: 2018-10-06 | Discharge: 2018-10-06 | Disposition: A | Discharge status: Home / Self Care | Payer: Medicare Other | Source: Ambulatory Visit | Attending: Internal Medicine | Admitting: Internal Medicine

## 2018-10-06 DIAGNOSIS — I639 Cerebral infarction, unspecified: Secondary | ICD-10-CM | POA: Insufficient documentation

## 2018-10-06 LAB — PROTIME-INR
INR: 1.5
PROTHROMBIN TIME: 17.9 s — AB (ref 11.4–15.2)

## 2018-10-07 ENCOUNTER — Other Ambulatory Visit
Admission: RE | Admit: 2018-10-07 | Discharge: 2018-10-07 | Disposition: A | Discharge status: Home / Self Care | Payer: Medicare Other | Source: Ambulatory Visit | Attending: Internal Medicine | Admitting: Internal Medicine

## 2018-10-07 DIAGNOSIS — I639 Cerebral infarction, unspecified: Secondary | ICD-10-CM | POA: Insufficient documentation

## 2018-10-07 LAB — PROTIME-INR
INR: 1.65
Prothrombin Time: 19.3 seconds — ABNORMAL HIGH (ref 11.4–15.2)

## 2018-10-09 ENCOUNTER — Other Ambulatory Visit
Admission: RE | Admit: 2018-10-09 | Discharge: 2018-10-09 | Disposition: A | Discharge status: Home / Self Care | Payer: Medicare Other | Source: Ambulatory Visit | Attending: Internal Medicine | Admitting: Internal Medicine

## 2018-10-09 ENCOUNTER — Encounter: Payer: Self-pay | Admitting: Adult Health

## 2018-10-09 ENCOUNTER — Non-Acute Institutional Stay (SKILLED_NURSING_FACILITY): Payer: Medicare Other | Admitting: Adult Health

## 2018-10-09 DIAGNOSIS — I639 Cerebral infarction, unspecified: Secondary | ICD-10-CM | POA: Diagnosis present

## 2018-10-09 DIAGNOSIS — F339 Major depressive disorder, recurrent, unspecified: Secondary | ICD-10-CM

## 2018-10-09 DIAGNOSIS — E1140 Type 2 diabetes mellitus with diabetic neuropathy, unspecified: Secondary | ICD-10-CM | POA: Diagnosis not present

## 2018-10-09 DIAGNOSIS — Z794 Long term (current) use of insulin: Secondary | ICD-10-CM

## 2018-10-09 DIAGNOSIS — Z7901 Long term (current) use of anticoagulants: Secondary | ICD-10-CM

## 2018-10-09 DIAGNOSIS — K5909 Other constipation: Secondary | ICD-10-CM

## 2018-10-09 DIAGNOSIS — F419 Anxiety disorder, unspecified: Secondary | ICD-10-CM

## 2018-10-09 DIAGNOSIS — I10 Essential (primary) hypertension: Secondary | ICD-10-CM

## 2018-10-09 DIAGNOSIS — I6930 Unspecified sequelae of cerebral infarction: Secondary | ICD-10-CM

## 2018-10-09 DIAGNOSIS — M8448XS Pathological fracture, other site, sequela: Secondary | ICD-10-CM

## 2018-10-09 DIAGNOSIS — Z8673 Personal history of transient ischemic attack (TIA), and cerebral infarction without residual deficits: Secondary | ICD-10-CM

## 2018-10-09 LAB — PROTIME-INR
INR: 1.62
Prothrombin Time: 19 seconds — ABNORMAL HIGH (ref 11.4–15.2)

## 2018-10-09 NOTE — Progress Notes (Signed)
Location:  [facility] Number: [id number] Place of Service:  SNF [phone]) Provider:  Durenda Age, NP  Patient Care Team: Juluis Pitch, MD as PCP - [facility])  Extended Emergency Contact Information Primary Emergency Contact: Danella Penton Address: [address]          [place], [place] [address] Johnnette Litter of [place] Phone: [phone] Mobile Phone: [phone] Relation: Spouse  Code Status:  DNR  Goals of care: Advanced Directive information Advanced Directives 10/04/2018  Does Patient Have a Medical Advance Directive? Yes  Type of Advance Directive Out of facility DNR (pink MOST or yellow form)  Does patient want to make changes to medical advance directive? No - Patient declined  Copy of [place] in Chart? -  Would patient like information on creating a medical advance directive? No - Patient declined  Pre-existing out of facility DNR order (yellow form or pink MOST form) Yellow form placed in chart (order not valid for inpatient use)     Chief Complaint  Patient presents with  . Acute Visit    Patient seen for an acute visit for medication management - Coumadin and pain medication.    HPI:  Pt is a 73 y.o. female seen today for an acute visit secondary to need for medication management to assess the continued need for pain and antianxiety medications.  She is a long-term resident of [facility]  She has a PMH of diabetes with complications of neuropathy and retinopathy, hypercholesterolemia, HTN, seizures, stroke and osteoporosis. Latest INR 1.65, subtherapeutic. She takes Coumadin for recurrent stroke and followed up by Dr. Gurney Maxin. Her next appointment is on 12/16/18.   She has been admitted to [facility] on 09/18/18 from a recent hospitalization due to bilateral sacral fracture. She has been admitted for a short-term rehabilitation.   Past Medical History:   Diagnosis Date  . At high risk for falls   . Basal cell carcinoma    right upper leg  . Complication of anesthesia    difficult to arouse  . Diabetes mellitus without complication (HCC)    Type I  . Diabetic neuropathy ([place])   . Diabetic retinopathy ([place])   . Dizziness   . Gastroparesis   . Herniated disc, cervical   . Hypercholesteremia   . Hypertension   . Hypotensive episode   . Hypothyroidism   . Insulin pump in place   . Myofascial muscle pain   . Numbness of arm    left  . Osteoporosis   . Poor vision   . Seizures ([place])   . Slurred speech   . Stroke (cerebrum) ([place])    4/19  . Stroke (HCC)    x5  . Vertigo   . Wears glasses    Past Surgical History:  Procedure Laterality Date  . APPENDECTOMY    . AUGMENTATION MAMMAPLASTY Bilateral 2011  . BILATERAL CARPAL TUNNEL RELEASE    . COLONOSCOPY    . EYE SURGERY    . HEMORROIDECTOMY    . HERNIA REPAIR    . JOINT REPLACEMENT    . OPEN REDUCTION INTERNAL FIXATION (ORIF) DISTAL RADIAL FRACTURE Right 05/29/2015   Procedure: OPEN REDUCTION INTERNAL FIXATION (ORIF) RIGHT DISTAL RADIUS FRACTURE WITH ALLOGRAFT BONE GRAFTING FOR REPAIR AND RECONSTRUCTION;  Surgeon: Roseanne Kaufman, MD;  Location: [place];  Service: Orthopedics;  Laterality: Right;  . SHOULDER ARTHROSCOPY Left   . SHOULDER HEMI-ARTHROPLASTY Left   . SYNOVECTOMY    .  TONSILLECTOMY    . TRIGGER FINGER RELEASE    . TUBAL LIGATION      Allergies  Allergen Reactions  . Codeine Nausea Only and Other (See Comments)    Reaction: stress attacks/ headache Can take Vicodin and Percocet   . Meperidine Nausea Only and Other (See Comments)    Reaction: stress attacks/ headache  . Metoclopramide Hcl Nausea Only and Other (See Comments)    Reaction: stress attacks/ headache  . Oxycodone Nausea Only and Other (See Comments)    Reaction: stress attacks/ headache  . Stadol [Butorphanol] Nausea Only and Other (See Comments)    Reaction: stress attacks/ headache  .  Tussionex Pennkinetic Er [Hydrocod Polst-Cpm Polst Er] Other (See Comments)    Filled patient with sugar   . Carbocaine  [Mepivacaine Hcl] Palpitations    Outpatient Encounter Medications as of 10/09/2018  Medication Sig  . acetaminophen (TYLENOL) 325 MG tablet Take 2 tablets (650 mg total) by mouth every 6 (six) hours as needed for headache. For mild pain  . ACETAMINOPHEN-BUTALBITAL 50-325 MG TABS Take 1 tablet by mouth 2 (two) times daily.  Marland Kitchen atorvastatin (LIPITOR) 40 MG tablet Take 40 mg by mouth daily.  . Calcium Carb-Cholecalciferol (CALCIUM 600 + D) 600-200 MG-UNIT TABS Take 1 tablet by mouth every evening.  . carboxymethylcellulose (REFRESH PLUS) 0.5 % SOLN Place 1 drop into both eyes as needed (for dry eyes).   . cetirizine (ZYRTEC) 10 MG tablet Take 10 mg by mouth daily.  . Cholecalciferol (VITAMIN D-3) 5000 units TABS Take 5,000 Units by mouth daily.  Marland Kitchen gabapentin (NEURONTIN) 300 MG capsule Give 3 tablets (900 mg) by mouth at bedtime  . gabapentin (NEURONTIN) 300 MG capsule Take 300 mg by mouth 2 (two) times daily.  . Garlic 9470 MG CAPS Take 1 capsule by mouth daily.   Marland Kitchen glucose 4 GM chewable tablet Chew 1 tablet by mouth as needed for low blood sugar. <65  . HYDROcodone-acetaminophen (NORCO) 10-325 MG tablet Take 1 tablet by mouth every 6 (six) hours as needed for up to 7 days.  . insulin aspart (NOVOLOG FLEXPEN) 100 UNIT/ML FlexPen Inject 0-15 units subcutaneous per sliding scale before meals and at bedtime 0 - 100 units = 0 units 101 - 120 = 1 unit 121 - 150 = 2 units 151 - 200 = 4 units 201 - 250 = 6 units 251 - 300 = 8 units 301 - 350 = 10 units 351 - 400 = 12 units 401 - 450 = 15 units > 450 call MD  . Insulin Glargine (LANTUS SOLOSTAR) 100 UNIT/ML Solostar Pen Inject 12 Units into the skin at bedtime.  Marland Kitchen linaclotide (LINZESS) 72 MCG capsule Take 72 mcg by mouth daily.  Marland Kitchen LORazepam (ATIVAN) 0.5 MG tablet Take 1 tablet (0.5 mg total) by mouth every 6 (six) hours as  needed for up to 10 days for anxiety.  Marland Kitchen losartan (COZAAR) 50 MG tablet Take 50 mg by mouth daily.   . Multiple Vitamins-Minerals (CEROVITE ADVANCED FORMULA PO) Take 1 tablet by mouth daily.  . NON FORMULARY Diet type:  NCS  . polyethylene glycol (MIRALAX / GLYCOLAX) packet Take 17 g by mouth daily.   Marland Kitchen senna (SENOKOT) 8.6 MG TABS tablet Take 2 tablets by mouth 2 (two) times daily.  Marland Kitchen venlafaxine (EFFEXOR) 75 MG tablet Take 75 mg by mouth 2 (two) times daily.  . vitamin C (ASCORBIC ACID) 500 MG tablet Take 500 mg by mouth 2 (two) times daily.  Marland Kitchen  warfarin (COUMADIN) 5 MG tablet Take 5 mg by mouth See admin instructions. Managed by Dr. Merian Capron, Mon, Wed, Fri, Sat  . [DISCONTINUED] fentaNYL (DURAGESIC) 25 MCG/HR Place 1 patch onto the skin every 3 (three) days.  . [DISCONTINUED] insulin aspart (NOVOLOG FLEXPEN) 100 UNIT/ML FlexPen Inject into the skin daily. Inject subcutaneous per sliding scale 0 - 100 = 0 units 101 - 120 = 1 unit 121 - 150 = 2 units 151 - 200 = 4 units 201 - 250 = 6 units 251 - 300 = 8 units 301 - 350 = 10 units 351 - 400 = 12 units 401 - 415 = 15 units  . [DISCONTINUED] promethazine (PHENERGAN) 25 MG suppository Place 1 suppository (25 mg total) rectally every 6 (six) hours as needed for up to 4 days for nausea or vomiting.   No facility-administered encounter medications on file as of 10/09/2018.     Review of Systems  GENERAL: No change in appetite, no fatigue, no weight changes, no fever, chills or weakness MOUTH and THROAT: Denies oral discomfort, gingival pain or bleeding, pain from teeth or hoarseness   RESPIRATORY: no cough, SOB, DOE, wheezing, hemoptysis CARDIAC: No chest pain, edema or palpitations GI: No abdominal pain, diarrhea, constipation, heart burn, nausea or vomiting GU: Denies dysuria, frequency, hematuria, incontinence, or discharge NEUROLOGICAL: Denies dizziness, syncope, numbness, or headache PSYCHIATRIC: Denies feelings of depression or  anxiety. No report of hallucinations, insomnia, paranoia, or agitation    Immunization History  Administered Date(s) Administered  . Influenza Inj Mdck Quad With Preservative 05/25/2017  . Influenza,inj,Quad PF,6+ Mos 05/30/2015  . Influenza-Unspecified 05/14/2012, 05/22/2013, 06/01/2014, 05/30/2016, 06/10/2018  . Pneumococcal Conjugate-13 08/31/2014  . Pneumococcal Polysaccharide-23 07/06/2011  . Zoster Recombinat (Shingrix) 03/12/2018, 05/14/2018   Pertinent  Health Maintenance Due  Topic Date Due  . FOOT EXAM  10/18/2018 (Originally 11/30/1955)  . OPHTHALMOLOGY EXAM  10/18/2018 (Originally 11/30/1955)  . DEXA SCAN  10/18/2018 (Originally 11/30/2008)  . COLONOSCOPY  10/18/2018 (Originally 11/30/1993)  . HEMOGLOBIN A1C  11/28/2018  . MAMMOGRAM  09/14/2019  . INFLUENZA VACCINE  Completed  . PNA vac Low Risk Adult  Completed   Fall Risk  04/11/2018 03/05/2018 12/10/2017 10/30/2017 10/04/2017  Falls in the past year? No No Yes No No  Number falls in past yr: - - 1 - -  Injury with Fall? - - Yes - -  Comment - - fell down steps and hurt right foot, did not go to hospital - -     Vitals:   10/09/18 1730  BP: 133/66  Pulse: 85  Resp: 16  Temp: 98.5 F (36.9 C)  TempSrc: Oral  SpO2: 98%  Weight: 103 lb 6.4 oz (46.9 kg)  Height: 5\' 2"  (1.575 m)   Body mass index is 18.91 kg/m.  Physical Exam  GENERAL APPEARANCE:  In no acute distress. SKIN:  Skin is warm and dry.  MOUTH and THROAT: Lips are without lesions. Oral mucosa is moist and without lesions. Tongue is normal in shape, size, and color and without lesions RESPIRATORY: Breathing is even & unlabored, BS CTAB CARDIAC: RRR, no murmur,no extra heart sounds, no edema GI: Abdomen soft, normal BS, no masses, no tenderness EXTREMITIES:  Able to move X 4 extremities NEUROLOGICAL: There is no tremor. Speech is clear. Alert and oriented X 3. PSYCHIATRIC:  Affect and behavior are appropriate   Labs reviewed: Recent Labs     09/16/18 0348 09/29/18 0751 09/30/18 0405  NA 140 136 139  K  4.0 3.9 4.1  CL 101 99 102  CO2 32 31 31  GLUCOSE 240* 99 47*  BUN 16 17 9   CREATININE 0.50 0.50 0.47  CALCIUM 9.2 8.7* 9.0   Recent Labs    05/28/18 1731 09/15/18 1808 09/29/18 0751  AST 28 28 46*  ALT 20 20 28   ALKPHOS 65 176* 152*  BILITOT 0.4 0.6 0.5  PROT 7.0 6.7 6.7  ALBUMIN 4.6 3.7 3.0*   Recent Labs    05/28/18 1731  09/15/18 1808  09/18/18 0310 09/29/18 0751 09/29/18 1711 09/30/18 0405  WBC 6.1   < > 9.7   < > 7.8 9.7  --  7.2  NEUTROABS 3.3  --  8.1*  --   --  6.6  --   --   HGB 12.1   < > 12.0   < > 11.7* 10.7* 10.2* 10.3*  HCT 36.6   < > 36.2   < > 35.5* 33.2*  --  32.1*  MCV 95.6   < > 93.3   < > 93.4 94.9  --  95.5  PLT 233   < > 299   < > 280 378  --  357   < > = values in this interval not displayed.   Lab Results  Component Value Date   TSH 1.912 11/11/2017   Lab Results  Component Value Date   HGBA1C 6.9 (H) 05/29/2018   Lab Results  Component Value Date   CHOL 148 05/29/2018   HDL 84 05/29/2018   LDLCALC 46 05/29/2018   TRIG 88 05/29/2018   CHOLHDL 1.8 05/29/2018    Significant Diagnostic Results in last 30 days:  Ct Pelvis W Contrast  Result Date: 09/15/2018 CLINICAL DATA:  Severe pain in the coccyx area after a fall EXAM: CT PELVIS WITH CONTRAST TECHNIQUE: Multidetector CT imaging of the pelvis was performed using the standard protocol following the bolus administration of intravenous contrast. CONTRAST:  166mL ISOVUE-300 IOPAMIDOL (ISOVUE-300) INJECTION 61% COMPARISON:  CT 11/13/2017 FINDINGS: Urinary Tract:  No abnormality visualized. Bowel:  Copious stool in the colon.  No bowel wall thickening. Vascular/Lymphatic: No aneurism. Moderate aortic atherosclerosis. No pelvic adenopathy Reproductive:  No mass or other significant abnormality Other:  Presacral edema and soft tissue stranding. Musculoskeletal: Acute nondisplaced right transverse process fracture at L5. Linear  lucency involving the right sacral ala consistent with nondisplaced fracture. Additional acute mildly displaced left sacral ala fracture. Linear sclerosis within the right sacral body at the S3 and S4 level suggesting subacute fracture. No SI joint widening. IMPRESSION: 1. Acute appearing bilateral sacral ala fractures. Linear sclerosis within the mid to distal sacral body on the right side, suspicious for subacute sacral fracture. 2. Acute nondisplaced right transverse process fracture Electronically Signed   By: Donavan Foil M.D.   On: 09/15/2018 20:48   Mr Lumbar Spine Wo Contrast  Result Date: 09/16/2018 CLINICAL DATA:  Back and sacral pain. EXAM: MRI LUMBAR SPINE WITHOUT CONTRAST TECHNIQUE: Multiplanar, multisequence MR imaging of the lumbar spine was performed. No intravenous contrast was administered. COMPARISON:  CT pelvis 09/15/2018 FINDINGS: Segmentation: There are five lumbar type vertebral bodies. The last full intervertebral disc space is labeled L5-S1. Alignment: Severe scoliosis. Normal overall alignment in the sagittal plane. Vertebrae: No lumbar compression fractures are identified. No bone lesions. Endplate reactive changes noted mainly at L4-5. Bilateral sacral fractures are noted as demonstrated on the recent pelvic CT scan. Conus medullaris and cauda equina: Conus extends to the T12-L1 level. Conus and  cauda equina appear normal. Paraspinal and other soft tissues: No significant paraspinal or retroperitoneal findings. Disc levels: L1-2: Mild annular bulge and mild to moderate facet disease. Mild left foraminal stenosis and mild bilateral lateral recess stenosis, left greater than right. L1-2: Advanced disc disease and mild to moderate facet disease. There is a bulging degenerated annulus and osteophytic ridging with flattening of the ventral thecal sac and mild bilateral lateral recess stenosis, left greater than right. Mild left foraminal encroachment also. L3-4: Moderate facet disease but  no disc protrusions, spinal or foraminal stenosis. L4-5: Advanced disc disease and facet disease. There is a bulging degenerated annulus, osteophytic ridging and facet disease all contributing to moderate to moderately severe spinal and bilateral lateral recess stenosis and right greater than left foraminal stenosis. L5-S1: Moderate facet disease but no disc protrusions, spinal or foraminal stenosis. IMPRESSION: 1. Scoliosis and degenerative lumbar spondylosis. 2. No acute lumbar compression fractures. 3. Bilateral sacral fractures. 4. Mild bilateral lateral recess stenosis and left foraminal stenosis at L1-2 and L2-3. 5. Moderate to moderately severe multifactorial spinal, bilateral lateral recess and foraminal stenosis at L4-5. Electronically Signed   By: Marijo Sanes M.D.   On: 09/16/2018 12:43    Assessment/Plan  1. Bilateral sacral insufficiency fracture, sequela - continue PT and OT for therapeutic strengthening exercises, continue Norco 10-325 mg 1 tab every 6 hours PRN for pain, follow-up with orthopedics  2. Essential hypertension -Well-controlled, continue losartan 50 mg 1 tab daily  3. Type 2 diabetes mellitus with diabetic neuropathy, with long-term current use of insulin (HCC) Lab Results  Component Value Date   HGBA1C 6.9 (H) 05/29/2018  -Well-controlled, continue 100 units/mL inject 12 units at bedtime, NovoLog 100 units/mL sliding scale before meals and at bedtime and at 3 AM, gabapentin 300 mg 3 capsules at bedtime   4. Chronic constipation -Stable, continue Linzess 72 mcg 1 tab daily, MiraLAX 17 g daily and senna 8.6 mg 2 tabs twice a day as needed  5. Chronic cerebrovascular accident (CVA) -Currently on Coumadin, continue losartan 50 mg 1 tab daily atorvastatin 40 mg 1 tab daily, has a follow-up appointment with Dr. Gurney Maxin on 12/16/2018  6. Long term current use of anticoagulant -INR 1.65, subtherapeutic, will increase Coumadin to 6 mg daily on Tuesdays and  Thursdays, Coumadin 5 mg 1 tab daily on other days, INR on 10/11/18  7. Anxiety .-Mood is stable, continue Ativan 0.5 mg 1 tab every 6 hours PRN  8.  Major depression  -Continue venlafaxine 75 mg 1 tab twice a day   Family/ staff Communication: Discussed plan of care with resident.  Labs/tests ordered:  None  Goals of care:   Long-term care.   Durenda Age, NP [facility] [phone] (Monday-Friday 8:00 a.m. - 5:00 p.m.) [phone] (after hours)

## 2018-10-10 ENCOUNTER — Encounter: Payer: Self-pay | Admitting: Student in an Organized Health Care Education/Training Program

## 2018-10-10 ENCOUNTER — Non-Acute Institutional Stay (SKILLED_NURSING_FACILITY): Payer: Medicare Other | Admitting: Adult Health

## 2018-10-10 ENCOUNTER — Encounter: Payer: Self-pay | Admitting: Adult Health

## 2018-10-10 ENCOUNTER — Ambulatory Visit
Payer: Medicare Other | Attending: Student in an Organized Health Care Education/Training Program | Admitting: Student in an Organized Health Care Education/Training Program

## 2018-10-10 ENCOUNTER — Other Ambulatory Visit: Payer: Self-pay

## 2018-10-10 VITALS — BP 117/51 | HR 88 | Temp 97.6°F | Resp 18 | Ht 62.5 in | Wt 103.0 lb

## 2018-10-10 DIAGNOSIS — IMO0002 Reserved for concepts with insufficient information to code with codable children: Secondary | ICD-10-CM

## 2018-10-10 DIAGNOSIS — F339 Major depressive disorder, recurrent, unspecified: Secondary | ICD-10-CM

## 2018-10-10 DIAGNOSIS — M8448XS Pathological fracture, other site, sequela: Secondary | ICD-10-CM | POA: Diagnosis not present

## 2018-10-10 DIAGNOSIS — R200 Anesthesia of skin: Secondary | ICD-10-CM

## 2018-10-10 DIAGNOSIS — E1042 Type 1 diabetes mellitus with diabetic polyneuropathy: Secondary | ICD-10-CM

## 2018-10-10 DIAGNOSIS — E108 Type 1 diabetes mellitus with unspecified complications: Secondary | ICD-10-CM

## 2018-10-10 DIAGNOSIS — I6930 Unspecified sequelae of cerebral infarction: Secondary | ICD-10-CM

## 2018-10-10 DIAGNOSIS — G894 Chronic pain syndrome: Secondary | ICD-10-CM

## 2018-10-10 DIAGNOSIS — E1140 Type 2 diabetes mellitus with diabetic neuropathy, unspecified: Secondary | ICD-10-CM

## 2018-10-10 DIAGNOSIS — R202 Paresthesia of skin: Secondary | ICD-10-CM | POA: Diagnosis present

## 2018-10-10 DIAGNOSIS — G43709 Chronic migraine without aura, not intractable, without status migrainosus: Secondary | ICD-10-CM

## 2018-10-10 DIAGNOSIS — Z794 Long term (current) use of insulin: Secondary | ICD-10-CM

## 2018-10-10 DIAGNOSIS — I10 Essential (primary) hypertension: Secondary | ICD-10-CM

## 2018-10-10 DIAGNOSIS — K5904 Chronic idiopathic constipation: Secondary | ICD-10-CM

## 2018-10-10 DIAGNOSIS — F419 Anxiety disorder, unspecified: Secondary | ICD-10-CM

## 2018-10-10 MED ORDER — INSULIN ASPART 100 UNIT/ML FLEXPEN: 0.0000 [IU] | PEN_INJECTOR | Freq: Three times a day (TID) | SUBCUTANEOUS | 0 refills | Status: DC

## 2018-10-10 MED ORDER — CARBOXYMETHYLCELLULOSE SODIUM 0.5 % OP SOLN: 1.0000 [drp] | OPHTHALMIC | 0 refills | Status: DC | PRN

## 2018-10-10 MED ORDER — LINACLOTIDE 72 MCG PO CAPS: 72.0000 ug | ORAL_CAPSULE | Freq: Every day | ORAL | 0 refills | Status: DC

## 2018-10-10 MED ORDER — WARFARIN SODIUM 6 MG PO TABS: 6.0000 mg | ORAL_TABLET | Freq: Every day | ORAL | 0 refills | Status: DC

## 2018-10-10 MED ORDER — GABAPENTIN 300 MG PO CAPS: ORAL_CAPSULE | ORAL | 11 refills | Status: DC

## 2018-10-10 MED ORDER — GABAPENTIN 300 MG PO CAPS: ORAL_CAPSULE | ORAL | 0 refills | Status: AC

## 2018-10-10 MED ORDER — INSULIN GLARGINE 100 UNIT/ML SOLOSTAR PEN: 12.0000 [IU] | PEN_INJECTOR | Freq: Every day | SUBCUTANEOUS | 0 refills | Status: DC

## 2018-10-10 MED ORDER — WARFARIN SODIUM 5 MG PO TABS: 5.0000 mg | ORAL_TABLET | ORAL | 0 refills | Status: DC

## 2018-10-10 MED ORDER — LORAZEPAM 0.5 MG PO TABS: 0.5000 mg | ORAL_TABLET | Freq: Four times a day (QID) | ORAL | 0 refills | Status: AC | PRN

## 2018-10-10 MED ORDER — GLUCOSE 4 G PO CHEW: 1.0000 | CHEWABLE_TABLET | ORAL | 0 refills | Status: AC | PRN

## 2018-10-10 MED ORDER — ATORVASTATIN CALCIUM 40 MG PO TABS: 40.0000 mg | ORAL_TABLET | Freq: Every day | ORAL | 0 refills | Status: DC

## 2018-10-10 MED ORDER — CETIRIZINE HCL 10 MG PO TABS: 10.0000 mg | ORAL_TABLET | Freq: Every day | ORAL | 0 refills | Status: AC

## 2018-10-10 MED ORDER — VENLAFAXINE HCL 75 MG PO TABS: 75.0000 mg | ORAL_TABLET | Freq: Two times a day (BID) | ORAL | 0 refills | Status: AC

## 2018-10-10 MED ORDER — LOSARTAN POTASSIUM 50 MG PO TABS: 50.0000 mg | ORAL_TABLET | Freq: Every day | ORAL | 0 refills | Status: DC

## 2018-10-10 NOTE — Progress Notes (Signed)
Patient's Name: Erin Daniels  MRN: [id number]  Referring Provider: Juluis Pitch, MD  DOB: 09-Aug-1946  PCP: Juluis Pitch, MD  DOS: 10/10/2018  Note by: Gillis Santa, MD  Service setting: Ambulatory outpatient  Specialty: Interventional Pain Management  Location: [facility] (AMB) Pain Management Facility    Patient type: Established   Primary Reason(s) for Visit: Encounter for prescription drug management. (Level of risk: moderate)  CC: Back Pain (lower)  HPI  Erin Daniels is a 73 y.o. year old, female patient, who comes today for a medication management evaluation. 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 ([place]); Essential hypertension; Depression; Hypoglycemia; Lactic acid acidosis; Sepsis ([place]); Confusion; DKA (diabetic ketoacidoses) ([place]); Diabetic polyneuropathy associated with type 1 diabetes mellitus ([place]); Numbness and tingling of both legs; Chronic pain syndrome; Chronic cerebrovascular accident (CVA); Constipation; Acute urinary retention; Intractable pain; Hypertension associated with type 1 diabetes mellitus ([place]); Dyslipidemia due to type 1 diabetes mellitus ([place]); Anemia of chronic disease; Chronic migraine without aura; Chronic non-seasonal allergic rhinitis; Major depression, recurrent, chronic ([place]); Diabetic gastroparesis associated with type 1 diabetes mellitus ([place]); Bilateral sacral insufficiency fracture; Hypercoagulopathy ([place]); and Acute anxiety on their problem list. Her primarily concern today is the Back Pain (lower)  Pain Assessment: Location: Lower Back Radiating:  yes to lower buttocks Onset:  >3 months ago Duration: Chronic pain Quality:  severe, throbbing Severity: 8 /10 (subjective, self-reported pain score)  Note: Reported level is inconsistent with clinical observations.                         When using our objective Pain Scale, levels between 6 and 10/10 are said to belong in an emergency room, as  it progressively worsens from a 6/10, described as severely limiting, requiring emergency care not usually available at an outpatient pain management facility. At a 6/10 level, communication becomes difficult and requires great effort. Assistance to reach the emergency department may be required. Facial flushing and profuse sweating along with potentially dangerous increases in heart rate and blood pressure will be evident. Effect on ADL:   Timing:   Modifying factors:   BP: (!) 117/51  HR: 88  Erin Daniels was last scheduled for an appointment on 06/18/2018 for medication management. During today's appointment we reviewed Erin Daniels's chronic pain status, as well as her outpatient medication regimen.  Here for refill of Gabapentin as below.  Recently discharged from inpatient rehab due to fall that she sustained.  This resulted in bilateral sacral fracture.  Patient endorses chronic constipation and has difficulty with bowel movements.  The patient  reports no history of drug use. Her body mass index is 18.54 kg/m.  Further details on both, my assessment(s), as well as the proposed treatment plan, please see below.   Pharmacologic Plan: No change in therapy, at this time.             Laboratory Chemistry  Inflammation Markers (CRP: Acute Phase) (ESR: Chronic Phase) Lab Results  Component Value Date   LATICACIDVEN 1.2 11/11/2017                         Rheumatology Markers No results found for: RF, ANA, LABURIC, URICUR, LYMEIGGIGMAB, LYMEABIGMQN, HLAB27                      Renal Function Markers Lab Results  Component Value Date   BUN 9  09/30/2018   CREATININE 0.47 09/30/2018   GFRAA >60 09/30/2018   GFRNONAA >60 09/30/2018                             Hepatic Function Markers Lab Results  Component Value Date   AST 46 (H) 09/29/2018   ALT 28 09/29/2018   ALBUMIN 3.0 (L) 09/29/2018   ALKPHOS 152 (H) 09/29/2018   LIPASE 15 11/26/2016                         Electrolytes Lab Results  Component Value Date   NA 139 09/30/2018   K 4.1 09/30/2018   CL 102 09/30/2018   CALCIUM 9.0 09/30/2018                        Neuropathy Markers Lab Results  Component Value Date   HGBA1C 6.9 (H) 05/29/2018                        CNS Tests No results found for: COLORCSF, APPEARCSF, RBCCOUNTCSF, WBCCSF, POLYSCSF, LYMPHSCSF, EOSCSF, PROTEINCSF, GLUCCSF, JCVIRUS, CSFOLI, IGGCSF                      Bone Pathology Markers No results found for: VD25OH, [id number], [id number], [id number], 25OHVITD1, 25OHVITD2, 25OHVITD3, TESTOFREE, TESTOSTERONE                       Coagulation Parameters Lab Results  Component Value Date   INR 1.62 10/09/2018   LABPROT 19.0 (H) 10/09/2018   APTT 37 (H) 05/28/2018   PLT 357 09/30/2018                        Cardiovascular Markers Lab Results  Component Value Date   BNP 23.4 11/26/2016   CKTOTAL 100 11/26/2016   TROPONINI <0.03 05/28/2018   HGB 10.3 (L) 09/30/2018   HCT 32.1 (L) 09/30/2018                         CA Markers No results found for: CEA, CA125, LABCA2                      Endocrine Markers Lab Results  Component Value Date   TSH 1.912 11/11/2017                        Note: Lab results reviewed.  Recent Diagnostic Imaging Results  MR LUMBAR SPINE WO CONTRAST CLINICAL DATA:  Back and sacral pain.  EXAM: MRI LUMBAR SPINE WITHOUT CONTRAST  TECHNIQUE: Multiplanar, multisequence MR imaging of the lumbar spine was performed. No intravenous contrast was administered.  COMPARISON:  CT pelvis 09/15/2018  FINDINGS: Segmentation: There are five lumbar type vertebral bodies. The last full intervertebral disc space is labeled L5-S1.  Alignment: Severe scoliosis. Normal overall alignment in the sagittal plane.  Vertebrae: No lumbar compression fractures are identified. No bone lesions. Endplate reactive changes noted mainly at L4-5.  Bilateral sacral fractures are noted as demonstrated  on the recent pelvic CT scan.  Conus medullaris and cauda equina: Conus extends to the T12-L1 level. Conus and cauda equina appear normal.  Paraspinal and other soft tissues: No significant paraspinal or retroperitoneal findings.  Disc levels:  L1-2: Mild annular bulge  and mild to moderate facet disease. Mild left foraminal stenosis and mild bilateral lateral recess stenosis, left greater than right.  L1-2: Advanced disc disease and mild to moderate facet disease. There is a bulging degenerated annulus and osteophytic ridging with flattening of the ventral thecal sac and mild bilateral lateral recess stenosis, left greater than right. Mild left foraminal encroachment also.  L3-4: Moderate facet disease but no disc protrusions, spinal or foraminal stenosis.  L4-5: Advanced disc disease and facet disease. There is a bulging degenerated annulus, osteophytic ridging and facet disease all contributing to moderate to moderately severe spinal and bilateral lateral recess stenosis and right greater than left foraminal stenosis.  L5-S1: Moderate facet disease but no disc protrusions, spinal or foraminal stenosis.  IMPRESSION: 1. Scoliosis and degenerative lumbar spondylosis. 2. No acute lumbar compression fractures. 3. Bilateral sacral fractures. 4. Mild bilateral lateral recess stenosis and left foraminal stenosis at L1-2 and L2-3. 5. Moderate to moderately severe multifactorial spinal, bilateral lateral recess and foraminal stenosis at L4-5.  Electronically Signed   By: Marijo Sanes M.D.   On: 09/16/2018 12:43  Complexity Note: Imaging results reviewed. Results shared with Erin Daniels, using Layman's terms.                         Meds   Current Outpatient Medications:  .  acetaminophen (TYLENOL) 325 MG tablet, Take 2 tablets (650 mg total) by mouth every 6 (six) hours as needed for headache. For mild pain, Disp: 30 tablet, Rfl: 0 .  ACETAMINOPHEN-BUTALBITAL 50-325 MG  TABS, Take 1 tablet by mouth 2 (two) times daily., Disp: , Rfl:  .  Calcium Carb-Cholecalciferol (CALCIUM 600 + D) 600-200 MG-UNIT TABS, Take 1 tablet by mouth every evening., Disp: , Rfl:  .  Cholecalciferol (VITAMIN D-3) 5000 units TABS, Take 5,000 Units by mouth daily., Disp: , Rfl:  .  gabapentin (NEURONTIN) 300 MG capsule, Give 3 tablets (900 mg) by mouth at bedtime, Disp: , Rfl:  .  Garlic 8315 MG CAPS, Take 1 capsule by mouth daily. , Disp: , Rfl:  .  HYDROcodone-acetaminophen (NORCO) 10-325 MG tablet, Take 1 tablet by mouth every 6 (six) hours as needed for up to 7 days., Disp: 28 tablet, Rfl: 0 .  Multiple Vitamins-Minerals (CEROVITE ADVANCED FORMULA PO), Take 1 tablet by mouth daily., Disp: , Rfl:  .  NON FORMULARY, Diet type:  NCS, Disp: , Rfl:  .  polyethylene glycol (MIRALAX / GLYCOLAX) packet, Take 17 g by mouth daily. , Disp: , Rfl:  .  senna (SENOKOT) 8.6 MG TABS tablet, Take 2 tablets by mouth 2 (two) times daily., Disp: , Rfl:  .  vitamin C (ASCORBIC ACID) 500 MG tablet, Take 500 mg by mouth 2 (two) times daily., Disp: , Rfl:  .  atorvastatin (LIPITOR) 40 MG tablet, Take 1 tablet (40 mg total) by mouth daily., Disp: 30 tablet, Rfl: 0 .  carboxymethylcellulose (REFRESH PLUS) 0.5 % SOLN, Place 1 drop into both eyes as needed (for dry eyes)., Disp: 15 mL, Rfl: 0 .  cetirizine (ZYRTEC) 10 MG tablet, Take 1 tablet (10 mg total) by mouth daily., Disp: 30 tablet, Rfl: 0 .  gabapentin (NEURONTIN) 300 MG capsule, 300 mg in the morning, 300 mg in the afternoon, 900 mg in the evening, Disp: 150 capsule, Rfl: 11 .  glucose 4 GM chewable tablet, Chew 1 tablet (4 g total) by mouth as needed for low blood sugar. <65, Disp: 50 tablet,  Rfl: 0 .  insulin aspart (NOVOLOG FLEXPEN) 100 UNIT/ML FlexPen, Inject 0-15 Units into the skin 3 (three) times daily with meals. Inject 0-15 units subcutaneous per sliding scale before meals and at bedtime 0 - 100 units = 0 units 101 - 120 = 1 unit 121 - 150 = 2  units 151 - 200 = 4 units 201 - 250 = 6 units 251 - 300 = 8 units 301 - 350 = 10 units 351 - 400 = 12 units 401 - 450 = 15 units > 450 call MD, Disp: 15 mL, Rfl: 0 .  Insulin Glargine (LANTUS SOLOSTAR) 100 UNIT/ML Solostar Pen, Inject 12 Units into the skin at bedtime., Disp: 15 mL, Rfl: 0 .  linaclotide (LINZESS) 72 MCG capsule, Take 1 capsule (72 mcg total) by mouth daily for 30 days., Disp: 30 capsule, Rfl: 0 .  LORazepam (ATIVAN) 0.5 MG tablet, Take 1 tablet (0.5 mg total) by mouth every 6 (six) hours as needed for up to 10 days for anxiety., Disp: 30 tablet, Rfl: 0 .  losartan (COZAAR) 50 MG tablet, Take 1 tablet (50 mg total) by mouth daily for 30 days., Disp: 30 tablet, Rfl: 0 .  venlafaxine (EFFEXOR) 75 MG tablet, Take 1 tablet (75 mg total) by mouth 2 (two) times daily., Disp: 60 tablet, Rfl: 0 .  warfarin (COUMADIN) 5 MG tablet, Take 1 tablet (5 mg total) by mouth See admin instructions. Managed by Dr. Merian Capron, Mon, Wed, Fri, Sat, Disp: 10 tablet, Rfl: 0 .  warfarin (COUMADIN) 6 MG tablet, Take 1 tablet (6 mg total) by mouth daily. Once A Day on Tue, Thu, Disp: 4 tablet, Rfl: 0  ROS  Constitutional: Denies any fever or chills Gastrointestinal: No reported hemesis, hematochezia, vomiting, or acute GI distress Musculoskeletal: Denies any acute onset joint swelling, redness, loss of ROM, or weakness Neurological: No reported episodes of acute onset apraxia, aphasia, dysarthria, agnosia, amnesia, paralysis, loss of coordination, or loss of consciousness  Allergies  Erin Daniels is allergic to codeine; meperidine; metoclopramide hcl; oxycodone; stadol [butorphanol]; tussionex pennkinetic er [hydrocod polst-cpm polst er]; and carbocaine  [mepivacaine hcl].  PFSH  Drug: Erin Daniels  reports no history of drug use. Alcohol:  reports no history of alcohol use. Tobacco:  reports that she has never smoked. She has never used smokeless tobacco. Medical:  has a past medical history of At  high risk for falls, Basal cell carcinoma, Complication of anesthesia, Diabetes mellitus without complication ([place]), Diabetic neuropathy ([place]), Diabetic retinopathy ([place]), Dizziness, Gastroparesis, Herniated disc, cervical, Hypercholesteremia, Hypertension, Hypotensive episode, Hypothyroidism, Insulin pump in place, Myofascial muscle pain, Numbness of arm, Osteoporosis, Poor vision, Seizures ([place]), Slurred speech, Stroke (cerebrum) ([place]), Stroke ([place]), Vertigo, and Wears glasses. Surgical: Erin Daniels  has a past surgical history that includes Tonsillectomy; Hemorroidectomy; Hernia repair; Joint replacement; Appendectomy; Tubal ligation; Eye surgery; Trigger finger release; Bilateral carpal tunnel release; Synovectomy; Colonoscopy; Shoulder arthroscopy (Left); Shoulder hemi-arthroplasty (Left); Open reduction internal fixation (orif) distal radial fracture (Right, 05/29/2015); and Augmentation mammaplasty (Bilateral, 2011). Family: family history includes Heart failure in her father; Hypertension in her mother; Stroke in her mother.  Constitutional Exam  General appearance: Well nourished, well developed, and well hydrated. In no apparent acute distress Vitals:   10/10/18 1301  BP: (!) 117/51  Pulse: 88  Resp: 18  Temp: 97.6 F (36.4 C)  TempSrc: Oral  SpO2: 100%  Weight: 103 lb (46.7 kg)  Height: 5' 2.5" (1.588 m)   BMI Assessment: Estimated  body mass index is 18.54 kg/m as calculated from the following:   Height as of this encounter: 5' 2.5" (1.588 m).   Weight as of this encounter: 103 lb (46.7 kg).  BMI interpretation table: BMI level Category Range association with higher incidence of chronic pain  <18 kg/m2 Underweight   18.5-24.9 kg/m2 Ideal body weight   25-29.9 kg/m2 Overweight Increased incidence by 20%  30-34.9 kg/m2 Obese (Class I) Increased incidence by 68%  35-39.9 kg/m2 Severe obesity (Class II) Increased incidence by 136%  >40 kg/m2 Extreme obesity (Class III) Increased  incidence by 254%   Patient's current BMI Ideal Body weight  Body mass index is 18.54 kg/m. Ideal body weight: 51.3 kg (112 lb 15.8 oz)   BMI Readings from Last 4 Encounters:  10/10/18 18.54 kg/m  10/10/18 18.91 kg/m  10/09/18 18.91 kg/m  10/04/18 18.22 kg/m   Wt Readings from Last 4 Encounters:  10/10/18 103 lb (46.7 kg)  10/10/18 103 lb 6.4 oz (46.9 kg)  10/09/18 103 lb 6.4 oz (46.9 kg)  10/04/18 99 lb 9.6 oz (45.2 kg)  Psych/Mental status: Alert, oriented x 3 (person, place, & time)       Eyes: PERLA Respiratory: No evidence of acute respiratory distress  Cervical Spine Area Exam  Skin & Axial Inspection: No masses, redness, edema, swelling, or associated skin lesions Alignment: Symmetrical Functional ROM: Unrestricted ROM      Stability: No instability detected Muscle Tone/Strength: Functionally intact. No obvious neuro-muscular anomalies detected. Sensory (Neurological): Unimpaired Palpation: No palpable anomalies              Upper Extremity (UE) Exam    Side: Right upper extremity  Side: Left upper extremity  Skin & Extremity Inspection: Skin color, temperature, and hair growth are WNL. No peripheral edema or cyanosis. No masses, redness, swelling, asymmetry, or associated skin lesions. No contractures.  Skin & Extremity Inspection: Skin color, temperature, and hair growth are WNL. No peripheral edema or cyanosis. No masses, redness, swelling, asymmetry, or associated skin lesions. No contractures.  Functional ROM: Unrestricted ROM          Functional ROM: Unrestricted ROM          Muscle Tone/Strength: Functionally intact. No obvious neuro-muscular anomalies detected.  Muscle Tone/Strength: Functionally intact. No obvious neuro-muscular anomalies detected.  Sensory (Neurological): Unimpaired          Sensory (Neurological): Unimpaired          Palpation: No palpable anomalies              Palpation: No palpable anomalies              Provocative Test(s):  Phalen's  test: deferred Tinel's test: deferred Apley's scratch test (touch opposite shoulder):  Action 1 (Across chest): deferred Action 2 (Overhead): deferred Action 3 (LB reach): deferred   Provocative Test(s):  Phalen's test: deferred Tinel's test: deferred Apley's scratch test (touch opposite shoulder):  Action 1 (Across chest): deferred Action 2 (Overhead): deferred Action 3 (LB reach): deferred    Thoracic Spine Area Exam  Skin & Axial Inspection: No masses, redness, or swelling Alignment: Symmetrical Functional ROM: Unrestricted ROM Stability: No instability detected Muscle Tone/Strength: Functionally intact. No obvious neuro-muscular anomalies detected. Sensory (Neurological): Unimpaired Muscle strength & Tone: No palpable anomalies  Lumbar Spine Area Exam  Skin & Axial Inspection: No masses, redness, or swelling Alignment: Symmetrical Functional ROM: Decreased ROM affecting both sides Stability: No instability detected Muscle Tone/Strength: Functionally intact. No obvious neuro-muscular anomalies  detected. Sensory (Neurological): Musculoskeletal pain pattern Palpation: Complains of area being tender to palpation       Provocative Tests: Hyperextension/rotation test: (+) bilaterally for facet joint pain. Lumbar quadrant test (Kemp's test): deferred today       Lateral bending test: deferred today       Patrick's Maneuver: deferred today                   FABER* test: deferred today                   S-I anterior distraction/compression test: deferred today         S-I lateral compression test: deferred today         S-I Thigh-thrust test: deferred today         S-I Gaenslen's test: deferred today         *(Flexion, ABduction and External Rotation)  Gait & Posture Assessment  Ambulation: Patient came in today in a wheel chair Gait: Ataxia (Appendicular) Posture: Difficulty standing up straight, due to pain   Lower Extremity Exam    Side: Right lower extremity  Side:  Left lower extremity  Stability: No instability observed          Stability: No instability observed          Skin & Extremity Inspection: Skin color, temperature, and hair growth are WNL. No peripheral edema or cyanosis. No masses, redness, swelling, asymmetry, or associated skin lesions. No contractures.  Skin & Extremity Inspection: Skin color, temperature, and hair growth are WNL. No peripheral edema or cyanosis. No masses, redness, swelling, asymmetry, or associated skin lesions. No contractures.  Functional ROM: Decreased ROM for all joints of the lower extremity          Functional ROM: Decreased ROM for all joints of the lower extremity          Muscle Tone/Strength: Functionally intact. No obvious neuro-muscular anomalies detected.  Muscle Tone/Strength: Functionally intact. No obvious neuro-muscular anomalies detected.  Sensory (Neurological): Neuropathic pain pattern        Sensory (Neurological): Neuropathic pain pattern        DTR: Patellar: 1+: trace Achilles: deferred today Plantar: deferred today  DTR: Patellar: 1+: trace Achilles: deferred today Plantar: deferred today  Palpation: No palpable anomalies  Palpation: No palpable anomalies   Assessment   Status Diagnosis  Persistent Persistent Persistent 1. Diabetic polyneuropathy associated with type 1 diabetes mellitus ([place])   2. Type 1 diabetes mellitus with complication ([facility])   3. Numbness and tingling of both legs   4. Chronic pain syndrome      General Recommendations: The pain condition that the patient suffers from is best treated with a multidisciplinary approach that involves an increase in physical activity to prevent de-conditioning and worsening of the pain cycle, as well as psychological counseling (formal and/or informal) to address the co-morbid psychological affects of pain. Treatment will often involve judicious use of pain medications and interventional procedures to decrease the pain, allowing the patient  to participate in the physical activity that will ultimately produce long-lasting pain reductions. The goal of the multidisciplinary approach is to return the patient to a higher level of overall function and to restore their ability to perform activities of daily living.  73 year old female with a history of prior CVA who presents with lower extremity paresthesias most pronounced in her ankles and feet secondary to diabetic polyneuropathy from type 1 diabetes. Patient has been dealing with neuropathy for  greater than 20 years. She has an insulin pump in place.   Here for refill of Gabapentin as below.  Recently discharged from inpatient rehab due to fall that she sustained.  This resulted in bilateral sacral fracture.  Patient endorses chronic constipation and has difficulty with bowel movements.  Discussed importance of maintaining an appropriate bowel regimen and patient states that she does take MiraLAX as needed.  In regards to chronic opioid therapy, I do not recommend given that this could worsen the patient's constipation, cognitive status and an increased fall risk.  Patient has been on gabapentin for quite some time and finds benefit with this medication.  This is not resulted in constipation and so this is reasonable to continue at its current dose of 300 mg in the morning, 300 mg in the afternoon, 900 mg nightly.  Given that the patient is not a interventional candidate (centralized pain, currently on Coumadin for stroke) and since I am only managing her gabapentin, instructed the patient to follow back up with her primary care physician who should be able to manage her gabapentin at his current dose which has been stable.  Plan of Care  Pharmacotherapy (Medications Ordered): Meds ordered this encounter  Medications  . DISCONTD: gabapentin (NEURONTIN) 300 MG capsule    Sig: 300 mg in the morning, 300 mg in the afternoon, 900 mg in the evening    Dispense:  150 capsule    Refill:   11   Provider-requested follow-up: Return if symptoms worsen or fail to improve.  Time Note: Greater than 50% of the 25 minute(s) of face-to-face time spent with Erin Daniels, was spent in counseling/coordination of care regarding: Erin Daniels's primary cause of pain, the treatment plan, treatment alternatives, the risks and possible complications of proposed treatment, medication side effects, realistic expectations and the goals of pain management (increased in functionality).  Primary Care Physician: Juluis Pitch, MD Location: [facility] Note by: Gillis Santa, M.D Date: 10/10/2018; Time: 2:38 PM  There are no Patient Instructions on file for this visit.

## 2018-10-10 NOTE — Progress Notes (Signed)
Safety precautions to be maintained throughout the outpatient stay will include: orient to surroundings, keep bed in low position, maintain call bell within reach at all times, provide assistance with transfer out of bed and ambulation.  

## 2018-10-10 NOTE — Progress Notes (Signed)
[facility]  [facility] [place] [facility] Number: [place] of Service:  SNF ([phone]) Provider:  Durenda Age, NP  Patient Care Team: Juluis Pitch, MD as PCP - General [facility] Medicine)  Extended Emergency Contact Information Primary Emergency Contact: Danella Penton Address: [address]          [place], [place] [address] Johnnette Litter of [place] Phone: [phone] Mobile Phone: [phone] Relation: Spouse  Code Status:  DNR  Goals of care: Advanced Directive information Advanced Directives 10/10/2018  Does Patient Have a Medical Advance Directive? Yes  Type of Paramedic of [place];Living will  Does patient want to make changes to medical advance directive? -  Copy of [place] in Chart? No - copy requested  Would patient like information on creating a medical advance directive? -  Pre-existing out of facility DNR order (yellow form or pink MOST form) -     Chief Complaint  Patient presents with  . Discharge Note    Discharge from Facility 10/13/2018    HPI:  Pt is a 73 y.o. female seen today for discharge to home on 10/12/18 with Home health PT, OT and Nursing.  She has been admitted to [facility] on 09/18/2018 from a recent hospitalization due to bilateral sacral fracture. Patient was admitted to this facility for short-term rehabilitation after the patient's recent hospitalization.  Patient has completed SNF rehabilitation and therapy has cleared the patient for discharge.   Past Medical History:  Diagnosis Date  . At high risk for falls   . Basal cell carcinoma    right upper leg  . Complication of anesthesia    difficult to arouse  . Diabetes mellitus without complication (HCC)    Type I  . Diabetic neuropathy ([place])   . Diabetic retinopathy ([place])   . Dizziness   . Gastroparesis   . Herniated disc, cervical   . Hypercholesteremia   . Hypertension   . Hypotensive  episode   . Hypothyroidism   . Insulin pump in place   . Myofascial muscle pain   . Numbness of arm    left  . Osteoporosis   . Poor vision   . Seizures ([place])   . Slurred speech   . 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 Stroke (HCC)    x5  . Vertigo   . Wears glasses    Past Surgical History:  Procedure Laterality Date  . APPENDECTOMY    . AUGMENTATION MAMMAPLASTY Bilateral 2011  . BILATERAL CARPAL TUNNEL RELEASE    . COLONOSCOPY    . EYE SURGERY    . HEMORROIDECTOMY    . HERNIA REPAIR    . JOINT REPLACEMENT    . OPEN REDUCTION INTERNAL FIXATION (ORIF) DISTAL RADIAL FRACTURE Right 05/29/2015   Procedure: OPEN REDUCTION INTERNAL FIXATION (ORIF) RIGHT DISTAL RADIUS FRACTURE WITH ALLOGRAFT BONE GRAFTING FOR REPAIR AND RECONSTRUCTION;  Surgeon: Roseanne Kaufman, MD;  Location: [place];  Service: Orthopedics;  Laterality: Right;  . SHOULDER ARTHROSCOPY Left   . SHOULDER HEMI-ARTHROPLASTY Left   . SYNOVECTOMY    . TONSILLECTOMY    . TRIGGER FINGER RELEASE    . TUBAL LIGATION      Allergies  Allergen Reactions  . Codeine Nausea Only and Other (See Comments)    Reaction: stress attacks/ headache Can take Vicodin and Percocet   . Meperidine Nausea Only and Other (See Comments)    Reaction: stress attacks/ headache  .  Metoclopramide Hcl Nausea Only and Other (See Comments)    Reaction: stress attacks/ headache  . Oxycodone Nausea Only and Other (See Comments)    Reaction: stress attacks/ headache  . Stadol [Butorphanol] Nausea Only and Other (See Comments)    Reaction: stress attacks/ headache  . Tussionex Pennkinetic Er [Hydrocod Polst-Cpm Polst Er] Other (See Comments)    Filled patient with sugar   . Carbocaine  [Mepivacaine Hcl] Palpitations    Outpatient Encounter Medications as of 10/10/2018  Medication Sig  . acetaminophen (TYLENOL) 325 MG tablet Take 2 tablets (650 mg total) by mouth every 6 (six) hours as needed for headache. For mild pain  . ACETAMINOPHEN-BUTALBITAL  50-325 MG TABS Take 1 tablet by mouth 2 (two) times daily.  Marland Kitchen atorvastatin (LIPITOR) 40 MG tablet Take 1 tablet (40 mg total) by mouth daily.  . Calcium Carb-Cholecalciferol (CALCIUM 600 + D) 600-200 MG-UNIT TABS Take 1 tablet by mouth every evening.  . carboxymethylcellulose (REFRESH PLUS) 0.5 % SOLN Place 1 drop into both eyes as needed (for dry eyes).  . cetirizine (ZYRTEC) 10 MG tablet Take 1 tablet (10 mg total) by mouth daily.  . Cholecalciferol (VITAMIN D-3) 5000 units TABS Take 5,000 Units by mouth daily.  . Garlic 2263 MG CAPS Take 1 capsule by mouth daily.   Marland Kitchen glucose 4 GM chewable tablet Chew 1 tablet (4 g total) by mouth as needed for low blood sugar. <65  . HYDROcodone-acetaminophen (NORCO) 10-325 MG tablet Take 1 tablet by mouth every 6 (six) hours as needed for up to 7 days.  . insulin aspart (NOVOLOG FLEXPEN) 100 UNIT/ML FlexPen Inject 0-15 Units into the skin 3 (three) times daily with meals. Inject 0-15 units subcutaneous per sliding scale before meals and at bedtime 0 - 100 units = 0 units 101 - 120 = 1 unit 121 - 150 = 2 units 151 - 200 = 4 units 201 - 250 = 6 units 251 - 300 = 8 units 301 - 350 = 10 units 351 - 400 = 12 units 401 - 450 = 15 units > 450 call MD  . Insulin Glargine (LANTUS SOLOSTAR) 100 UNIT/ML Solostar Pen Inject 12 Units into the skin at bedtime.  Marland Kitchen linaclotide (LINZESS) 72 MCG capsule Take 1 capsule (72 mcg total) by mouth daily for 30 days.  Marland Kitchen LORazepam (ATIVAN) 0.5 MG tablet Take 1 tablet (0.5 mg total) by mouth every 6 (six) hours as needed for up to 10 days for anxiety.  Marland Kitchen losartan (COZAAR) 50 MG tablet Take 1 tablet (50 mg total) by mouth daily for 30 days.  . Multiple Vitamins-Minerals (CEROVITE ADVANCED FORMULA PO) Take 1 tablet by mouth daily.  . NON FORMULARY Diet type:  NCS  . polyethylene glycol (MIRALAX / GLYCOLAX) packet Take 17 g by mouth daily.   Marland Kitchen senna (SENOKOT) 8.6 MG TABS tablet Take 2 tablets by mouth 2 (two) times daily.  Marland Kitchen  venlafaxine (EFFEXOR) 75 MG tablet Take 1 tablet (75 mg total) by mouth 2 (two) times daily.  . vitamin C (ASCORBIC ACID) 500 MG tablet Take 500 mg by mouth 2 (two) times daily.  Marland Kitchen warfarin (COUMADIN) 5 MG tablet Take 1 tablet (5 mg total) by mouth See admin instructions. Managed by Dr. Merian Capron, Mon, Wed, Fri, Sat  . warfarin (COUMADIN) 6 MG tablet Take 1 tablet (6 mg total) by mouth daily. Once A Day on Tue, Thu  . [DISCONTINUED] atorvastatin (LIPITOR) 40 MG tablet Take 40 mg by  mouth daily.  . [DISCONTINUED] carboxymethylcellulose (REFRESH PLUS) 0.5 % SOLN Place 1 drop into both eyes as needed (for dry eyes).   . [DISCONTINUED] cetirizine (ZYRTEC) 10 MG tablet Take 10 mg by mouth daily.  . [DISCONTINUED] gabapentin (NEURONTIN) 300 MG capsule Give 3 tablets (900 mg) by mouth at bedtime  . [DISCONTINUED] gabapentin (NEURONTIN) 300 MG capsule Take 300 mg by mouth 2 (two) times daily.  . [DISCONTINUED] glucose 4 GM chewable tablet Chew 1 tablet by mouth as needed for low blood sugar. <65  . [DISCONTINUED] insulin aspart (NOVOLOG FLEXPEN) 100 UNIT/ML FlexPen Inject 0-15 units subcutaneous per sliding scale before meals and at bedtime 0 - 100 units = 0 units 101 - 120 = 1 unit 121 - 150 = 2 units 151 - 200 = 4 units 201 - 250 = 6 units 251 - 300 = 8 units 301 - 350 = 10 units 351 - 400 = 12 units 401 - 450 = 15 units > 450 call MD  . [DISCONTINUED] Insulin Glargine (LANTUS SOLOSTAR) 100 UNIT/ML Solostar Pen Inject 12 Units into the skin at bedtime.  . [DISCONTINUED] linaclotide (LINZESS) 72 MCG capsule Take 72 mcg by mouth daily.  . [DISCONTINUED] LORazepam (ATIVAN) 0.5 MG tablet Take 1 tablet (0.5 mg total) by mouth every 6 (six) hours as needed for up to 10 days for anxiety.  . [DISCONTINUED] losartan (COZAAR) 50 MG tablet Take 50 mg by mouth daily.   . [DISCONTINUED] venlafaxine (EFFEXOR) 75 MG tablet Take 75 mg by mouth 2 (two) times daily.  . [DISCONTINUED] warfarin (COUMADIN) 5 MG  tablet Take 5 mg by mouth See admin instructions. Managed by Dr. Merian Capron, Mon, Wed, Fri, Sat  . [DISCONTINUED] warfarin (COUMADIN) 6 MG tablet Take 6 mg by mouth daily. Once A Day on Tue, Thu  . gabapentin (NEURONTIN) 300 MG capsule 300 mg in the morning, 300 mg in the afternoon, 900 mg in the evening  . [DISCONTINUED] gabapentin (NEURONTIN) 300 MG capsule 300 mg in the morning, 300 mg in the afternoon, 900 mg in the evening   No facility-administered encounter medications on file as of 10/10/2018.     Review of Systems  GENERAL: No change in appetite, no fatigue, no weight changes, no fever, chills or weakness MOUTH and THROAT: Denies oral discomfort, gingival pain or bleeding RESPIRATORY: no cough, SOB, DOE, wheezing, hemoptysis CARDIAC: No chest pain, edema or palpitations GI: No abdominal pain, diarrhea, constipation, heart burn, nausea or vomiting GU: Denies dysuria, frequency, hematuria, incontinence, or discharge NEUROLOGICAL: Denies dizziness, syncope, numbness, or headache PSYCHIATRIC: Denies feelings of depression or anxiety. No report of hallucinations, insomnia, paranoia, or agitation    Immunization History  Administered Date(s) Administered  . Influenza Inj Mdck Quad With Preservative 05/25/2017  . Influenza,inj,Quad PF,6+ Mos 05/30/2015  . Influenza-Unspecified 05/14/2012, 05/22/2013, 06/01/2014, 05/30/2016, 06/10/2018  . Pneumococcal Conjugate-13 08/31/2014  . Pneumococcal Polysaccharide-23 07/06/2011  . Zoster Recombinat (Shingrix) 03/12/2018, 05/14/2018   Pertinent  Health Maintenance Due  Topic Date Due  . FOOT EXAM  10/18/2018 (Originally 11/30/1955)  . OPHTHALMOLOGY EXAM  10/18/2018 (Originally 11/30/1955)  . DEXA SCAN  10/18/2018 (Originally 11/30/2008)  . COLONOSCOPY  10/18/2018 (Originally 11/30/1993)  . HEMOGLOBIN A1C  11/28/2018  . MAMMOGRAM  09/14/2019  . INFLUENZA VACCINE  Completed  . PNA vac Low Risk Adult  Completed   Fall Risk  10/10/2018  04/11/2018 03/05/2018 12/10/2017 10/30/2017  Falls in the past year? 1 No No Yes No  Number falls in  past yr: 0 - - 1 -  Injury with Fall? 1 - - Yes -  Comment - - - fell down steps and hurt right foot, did not go to hospital -     Vitals:   10/10/18 1149  BP: 133/66  Pulse: 85  Resp: 16  Temp: 98.5 F (36.9 C)  TempSrc: Oral  SpO2: 98%  Weight: 103 lb 6.4 oz (46.9 kg)  Height: 5\' 2"  (1.575 m)   Body mass index is 18.91 kg/m.  Physical Exam  GENERAL APPEARANCE:  In no acute distress. SKIN:  Skin is warm and dry.  MOUTH and THROAT: Lips are without lesions. Oral mucosa is moist and without lesions. RESPIRATORY: Breathing is even & unlabored, BS CTAB CARDIAC: RRR, no murmur,no extra heart sounds, no edema GI: Abdomen soft, normal BS, no masses, no tenderness EXTREMITIES:  Able to move X 4 extremities NEUROLOGICAL: There is no tremor. Speech is clear. Alert and oriented X 3 PSYCHIATRIC: . Affect and behavior are appropriate   Labs reviewed: Recent Labs    09/16/18 0348 09/29/18 0751 09/30/18 0405  NA 140 136 139  K 4.0 3.9 4.1  CL 101 99 102  CO2 32 31 31  GLUCOSE 240* 99 47*  BUN 16 17 9   CREATININE 0.50 0.50 0.47  CALCIUM 9.2 8.7* 9.0   Recent Labs    05/28/18 1731 09/15/18 1808 09/29/18 0751  AST 28 28 46*  ALT 20 20 28   ALKPHOS 65 176* 152*  BILITOT 0.4 0.6 0.5  PROT 7.0 6.7 6.7  ALBUMIN 4.6 3.7 3.0*   Recent Labs    05/28/18 1731  09/15/18 1808  09/18/18 0310 09/29/18 0751 09/29/18 1711 09/30/18 0405  WBC 6.1   < > 9.7   < > 7.8 9.7  --  7.2  NEUTROABS 3.3  --  8.1*  --   --  6.6  --   --   HGB 12.1   < > 12.0   < > 11.7* 10.7* 10.2* 10.3*  HCT 36.6   < > 36.2   < > 35.5* 33.2*  --  32.1*  MCV 95.6   < > 93.3   < > 93.4 94.9  --  95.5  PLT 233   < > 299   < > 280 378  --  357   < > = values in this interval not displayed.   Lab Results  Component Value Date   TSH 1.912 11/11/2017   Lab Results  Component Value Date   HGBA1C 6.9 (H)  05/29/2018   Lab Results  Component Value Date   CHOL 148 05/29/2018   HDL 84 05/29/2018   LDLCALC 46 05/29/2018   TRIG 88 05/29/2018   CHOLHDL 1.8 05/29/2018    Significant Diagnostic Results in last 30 days:  Ct Pelvis W Contrast  Result Date: 09/15/2018 CLINICAL DATA:  Severe pain in the coccyx area after a fall EXAM: CT PELVIS WITH CONTRAST TECHNIQUE: Multidetector CT imaging of the pelvis was performed using the standard protocol following the bolus administration of intravenous contrast. CONTRAST:  135mL ISOVUE-300 IOPAMIDOL (ISOVUE-300) INJECTION 61% COMPARISON:  CT 11/13/2017 FINDINGS: Urinary Tract:  No abnormality visualized. Bowel:  Copious stool in the colon.  No bowel wall thickening. Vascular/Lymphatic: No aneurism. Moderate aortic atherosclerosis. No pelvic adenopathy Reproductive:  No mass or other significant abnormality Other:  Presacral edema and soft tissue stranding. Musculoskeletal: Acute nondisplaced right transverse process fracture at L5. Linear lucency involving the right sacral ala  consistent with nondisplaced fracture. Additional acute mildly displaced left sacral ala fracture. Linear sclerosis within the right sacral body at the S3 and S4 level suggesting subacute fracture. No SI joint widening. IMPRESSION: 1. Acute appearing bilateral sacral ala fractures. Linear sclerosis within the mid to distal sacral body on the right side, suspicious for subacute sacral fracture. 2. Acute nondisplaced right transverse process fracture Electronically Signed   By: Donavan Foil M.D.   On: 09/15/2018 20:48   Mr Lumbar Spine Wo Contrast  Result Date: 09/16/2018 CLINICAL DATA:  Back and sacral pain. EXAM: MRI LUMBAR SPINE WITHOUT CONTRAST TECHNIQUE: Multiplanar, multisequence MR imaging of the lumbar spine was performed. No intravenous contrast was administered. COMPARISON:  CT pelvis 09/15/2018 FINDINGS: Segmentation: There are five lumbar type vertebral bodies. The last full  intervertebral disc space is labeled L5-S1. Alignment: Severe scoliosis. Normal overall alignment in the sagittal plane. Vertebrae: No lumbar compression fractures are identified. No bone lesions. Endplate reactive changes noted mainly at L4-5. Bilateral sacral fractures are noted as demonstrated on the recent pelvic CT scan. Conus medullaris and cauda equina: Conus extends to the T12-L1 level. Conus and cauda equina appear normal. Paraspinal and other soft tissues: No significant paraspinal or retroperitoneal findings. Disc levels: L1-2: Mild annular bulge and mild to moderate facet disease. Mild left foraminal stenosis and mild bilateral lateral recess stenosis, left greater than right. L1-2: Advanced disc disease and mild to moderate facet disease. There is a bulging degenerated annulus and osteophytic ridging with flattening of the ventral thecal sac and mild bilateral lateral recess stenosis, left greater than right. Mild left foraminal encroachment also. L3-4: Moderate facet disease but no disc protrusions, spinal or foraminal stenosis. L4-5: Advanced disc disease and facet disease. There is a bulging degenerated annulus, osteophytic ridging and facet disease all contributing to moderate to moderately severe spinal and bilateral lateral recess stenosis and right greater than left foraminal stenosis. L5-S1: Moderate facet disease but no disc protrusions, spinal or foraminal stenosis. IMPRESSION: 1. Scoliosis and degenerative lumbar spondylosis. 2. No acute lumbar compression fractures. 3. Bilateral sacral fractures. 4. Mild bilateral lateral recess stenosis and left foraminal stenosis at L1-2 and L2-3. 5. Moderate to moderately severe multifactorial spinal, bilateral lateral recess and foraminal stenosis at L4-5. Electronically Signed   By: Marijo Sanes M.D.   On: 09/16/2018 12:43    Assessment/Plan  1. Bilateral sacral insufficiency fracture, sequela - will have Home health PT, OT and Nursing  2. Type  2 diabetes mellitus with diabetic neuropathy, with long-term current use of insulin (HCC) Lab Results  Component Value Date   HGBA1C 6.9 (H) 05/29/2018   - gabapentin (NEURONTIN) 300 MG capsule; 300 mg in the morning, 300 mg in the afternoon, 900 mg in the evening  Dispense: 150 capsule; Refill: 0 - insulin aspart (NOVOLOG FLEXPEN) 100 UNIT/ML FlexPen; Inject 0-15 Units into the skin 3 (three) times daily with meals. Inject 0-15 units subcutaneous per sliding scale before meals and at bedtime 0 - 100 units = 0 units 101 - 120 = 1 unit 121 - 150 = 2 units 151 - 200 = 4 units 201 - 250 = 6 units 251 - 300 = 8 units 301 - 350 = 10 units 351 - 400 = 12 units 401 - 450 = 15 units > 450 call MD  Dispense: 15 mL; Refill: 0 - Insulin Glargine (LANTUS SOLOSTAR) 100 UNIT/ML Solostar Pen; Inject 12 Units into the skin at bedtime.  Dispense: 15 mL; Refill: 0 - glucose  4 GM chewable tablet; Chew 1 tablet (4 g total) by mouth as needed for low blood sugar. <65  Dispense: 50 tablet; Refill: 0  3. Chronic cerebrovascular accident (CVA) - atorvastatin (LIPITOR) 40 MG tablet; Take 1 tablet (40 mg total) by mouth daily.  Dispense: 30 tablet; Refill: 0 - losartan (COZAAR) 50 MG tablet; Take 1 tablet (50 mg total) by mouth daily for 30 days.  Dispense: 30 tablet; Refill: 0 - warfarin (COUMADIN) 5 MG tablet; Take 1 tablet (5 mg total) by mouth See admin instructions. Managed by Dr. Merian Capron, Mon, Wed, Fri, Sat  Dispense: 10 tablet; Refill: 0 - warfarin (COUMADIN) 6 MG tablet; Take 1 tablet (6 mg total) by mouth daily. Once A Day on Tue, Thu  Dispense: 4 tablet; Refill: 0  4. Chronic pain syndrome - she had [facility]-up with the [facility] today  5. Essential hypertension - losartan (COZAAR) 50 MG tablet; Take 1 tablet (50 mg total) by mouth daily for 30 days.  Dispense: 30 tablet; Refill: 0  6. Major depression, recurrent, chronic (HCC) - venlafaxine (EFFEXOR) 75 MG tablet; Take 1 tablet (75 mg  total) by mouth 2 (two) times daily.  Dispense: 60 tablet; Refill: 0  7. Anxiety - LORazepam (ATIVAN) 0.5 MG tablet; Take 1 tablet (0.5 mg total) by mouth every 6 (six) hours as needed for up to 10 days for anxiety.  Dispense: 30 tablet; Refill: 0  8. Chronic idiopathic constipation - linaclotide (LINZESS) 72 MCG capsule; Take 1 capsule (72 mcg total) by mouth daily for 30 days.  Dispense: 30 capsule; Refill: 0 - Continue senna 8.6 mg 2 tabs twice a day and MiraLAX 17 g daily as needed  9. Chronic migraine - stable, continue butalbital-acetaminophen-caffeine 50-3 125-40 mg 1/2 - 1 tab twice a day as needed    I have filled out patient's discharge paperwork and e-prescribed medications except Norco which will be prescribed by the Pain Clinic she follow up with.  Patient will receive home health PT, OT, and Nursing.  DME provided: Bedside commode  Total discharge time: Greater than 30 minutes Greater than 50% was spent in counseling and coordination of care.  Discharge time involved coordination of the discharge process with social worker, nursing staff and therapy department. Medical justification for home health services/DME verified.   Durenda Age, NP [facility] [phone] (Monday-Friday 8:00 a.m. - 5:00 p.m.) [phone] (after hours)

## 2018-10-11 ENCOUNTER — Other Ambulatory Visit
Admission: RE | Admit: 2018-10-11 | Discharge: 2018-10-11 | Disposition: A | Discharge status: Home / Self Care | Payer: Medicare Other | Source: Skilled Nursing Facility | Attending: Internal Medicine | Admitting: Internal Medicine

## 2018-10-11 DIAGNOSIS — I639 Cerebral infarction, unspecified: Secondary | ICD-10-CM | POA: Insufficient documentation

## 2018-10-11 LAB — PROTIME-INR
INR: 1.77
Prothrombin Time: 20.4 seconds — ABNORMAL HIGH (ref 11.4–15.2)

## 2018-11-01 ENCOUNTER — Other Ambulatory Visit: Payer: Self-pay

## 2018-11-01 ENCOUNTER — Ambulatory Visit
Admission: RE | Admit: 2018-11-01 | Discharge: 2018-11-01 | Disposition: A | Discharge status: Home / Self Care | Payer: Medicare Other | Source: Ambulatory Visit | Attending: Family Medicine | Admitting: Family Medicine

## 2018-11-01 DIAGNOSIS — Z1231 Encounter for screening mammogram for malignant neoplasm of breast: Secondary | ICD-10-CM | POA: Insufficient documentation

## 2018-11-26 ENCOUNTER — Telehealth: Payer: Self-pay

## 2018-11-26 ENCOUNTER — Other Ambulatory Visit: Payer: Self-pay

## 2018-11-26 ENCOUNTER — Ambulatory Visit
Payer: Medicare Other | Attending: Student in an Organized Health Care Education/Training Program | Admitting: Student in an Organized Health Care Education/Training Program

## 2018-11-26 DIAGNOSIS — E108 Type 1 diabetes mellitus with unspecified complications: Secondary | ICD-10-CM | POA: Diagnosis not present

## 2018-11-26 DIAGNOSIS — E1042 Type 1 diabetes mellitus with diabetic polyneuropathy: Secondary | ICD-10-CM | POA: Diagnosis not present

## 2018-11-26 DIAGNOSIS — G894 Chronic pain syndrome: Secondary | ICD-10-CM

## 2018-11-26 DIAGNOSIS — G44229 Chronic tension-type headache, not intractable: Secondary | ICD-10-CM | POA: Diagnosis not present

## 2018-11-26 MED ORDER — BUTALBITAL-ACETAMINOPHEN 50-325 MG PO TABS: 1.0000 | ORAL_TABLET | Freq: Two times a day (BID) | ORAL | 1 refills | Status: DC | PRN

## 2018-11-26 NOTE — Progress Notes (Signed)
Pain Management Encounter Note - Virtual Visit via Telephone Telehealth (real-time audio visits between healthcare provider and patient).  Patient's Phone No. & Preferred Pharmacy:  [phone] (home); [phone] (mobile); (Preferred) [phone] - [place], [place] [address] Phone: [phone] Fax: [phone]  [place], [place] [address] [place] [address] Phone: [phone] Fax: [id number] [place], [place] [facility] [address] [place] [address] Phone: [phone] Fax: [phone], [place], [place] [address] Phone: [phone] Fax: [phone]   Pre-screening note:  Our staff contacted Erin Daniels and offered her an "in person", "face-to-face" appointment versus a telephone encounter. She indicated preferring the telephone encounter, at this time.  Reason for Virtual Visit: COVID-19*  Social distancing based on CDC and AMA recommendations.   I contacted Erin Daniels on 11/26/2018 at 9:59 AM by telephone and clearly identified myself as Gillis Santa, MD. I verified that I was speaking with the correct person using two identifiers (Name and date of birth: Dec 19, 1945).  Advanced Informed Consent I sought verbal advanced consent from Erin Daniels for telemedicine interactions and virtual visit. I informed Erin Daniels of the security and privacy concerns, risks, and limitations associated with performing an evaluation and management service by telephone. I also informed Erin Daniels of the availability of "in person" appointments and I informed her of the possibility of a patient responsible charge related to this service. Erin Daniels expressed understanding and agreed  to proceed.   Historic Elements   Erin Daniels is a 73 y.o. year old, female patient evaluated today after her last encounter by our practice on 11/26/2018. Erin Daniels  has a past medical history of At high risk for falls, Basal cell carcinoma, Complication of anesthesia, Diabetes mellitus without complication ([place]), Diabetic neuropathy ([place]), Diabetic retinopathy ([place]), Dizziness, Gastroparesis, Herniated disc, cervical, Hypercholesteremia, Hypertension, Hypotensive episode, Hypothyroidism, Insulin pump in place, Myofascial muscle pain, Numbness of arm, Osteoporosis, Poor vision, Seizures ([place]), Slurred speech, Stroke (cerebrum) ([place]), Stroke ([place]), Vertigo, and Wears glasses. She also  has a past surgical history that includes Tonsillectomy; Hemorroidectomy; Hernia repair; Joint replacement; Appendectomy; Tubal ligation; Eye surgery; Trigger finger release; Bilateral carpal tunnel release; Synovectomy; Colonoscopy; Shoulder arthroscopy (Left); Shoulder hemi-arthroplasty (Left); Open reduction internal fixation (orif) distal radial fracture (Right, 05/29/2015); and Augmentation mammaplasty (Bilateral, 2011). Erin Daniels has a current medication list which includes the following prescription(s): acetaminophen, acetaminophen-butalbital, atorvastatin, calcium carb-cholecalciferol, carboxymethylcellulose, cetirizine, vitamin d-3, gabapentin, garlic, glucose, insulin aspart, insulin glargine, linaclotide, losartan, multiple vitamins-minerals, NON FORMULARY, polyethylene glycol, senna, venlafaxine, vitamin c, warfarin, and warfarin. She  reports that she has never smoked. She has never used smokeless tobacco. She reports that she does not drink alcohol or use drugs. Erin Daniels is allergic to codeine; meperidine; metoclopramide hcl; oxycodone; stadol [butorphanol]; tussionex pennkinetic er [hydrocod polst-cpm polst er]; and carbocaine  [mepivacaine hcl].   HPI  I last saw her on 10/10/2018. She is being  evaluated for medication management.   Overall, patient's pain is at baseline.  No changes in her medical history.  Is requesting refill of acetaminophen butalbital for headaches.  Continues to utilize gabapentin as prescribed with noted benefit.   Review of recent tests  MM 3D SCREEN  BREAST W/IMPLANT BILATERAL CLINICAL DATA:  Screening.  EXAM: DIGITAL SCREENING BILATERAL MAMMOGRAM WITH IMPLANTS, CAD AND TOMO  The patient has retroglandular implants. Standard and implant displaced views were performed.  COMPARISON:  Previous exam(s).  ACR Breast Density Category c: The breast tissue is heterogeneously dense, which may obscure small masses.  FINDINGS: There are no findings suspicious for malignancy. Bilateral radiographically intact retroglandular gel implants. Images were processed with CAD.  IMPRESSION: No mammographic evidence of malignancy. A result letter of this screening mammogram will be mailed directly to the patient.  RECOMMENDATION: Screening mammogram in one year. (Code:[id number]-B-01Y)  BI-RADS CATEGORY  1:  Negative.  Electronically Signed   By: Fidela Salisbury M.D.   On: 11/01/2018 16:39   Hospital Outpatient Visit on 10/11/2018  Component Date Value Ref Range Status  . Prothrombin Time 10/11/2018 20.4* 11.4 - 15.2 seconds Final  . INR 10/11/2018 1.77   Final   Performed at [facility], [address] [place], [place] [address]   Assessment  The primary encounter diagnosis was Chronic tension-type headache, not intractable. Diagnoses of Diabetic polyneuropathy associated with type 1 diabetes mellitus ([place]), Type 1 diabetes mellitus with complication ([place]), and Chronic pain syndrome were also pertinent to this visit.  Plan of Care  I have changed Erin Daniels's ACETAMINOPHEN-BUTALBITAL. I am also having her maintain her polyethylene glycol, vitamin C, Calcium Carb-Cholecalciferol, Vitamin D-3, Garlic, acetaminophen, Multiple  Vitamins-Minerals (CEROVITE ADVANCED FORMULA PO), senna, NON FORMULARY, atorvastatin, carboxymethylcellulose, cetirizine, insulin aspart, Insulin Glargine, linaclotide, losartan, venlafaxine, warfarin, warfarin, glucose, and gabapentin.  Pharmacotherapy (Medications Ordered): Meds ordered this encounter  Medications  . ACETAMINOPHEN-BUTALBITAL 50-325 MG TABS    Sig: Take 1 tablet by mouth 2 (two) times daily as needed (headache).    Dispense:  180 each    Refill:  1   Follow-up plan:   Return if symptoms worsen or fail to improve.   I discussed the assessment and treatment plan with the patient. The patient was provided an opportunity to ask questions and all were answered. The patient agreed with the plan and demonstrated an understanding of the instructions.  Patient advised to call back or seek an in-person evaluation if the symptoms or condition worsens.  Total duration of non-face-to-face encounter: 12 minutes.  Note by: Gillis Santa, MD Date: 11/26/2018; Time: 9:59 AM  Disclaimer:  * Given the special circumstances of the COVID-19 pandemic, the federal government has announced that the Office for Civil Rights (OCR) will exercise its enforcement discretion and will not impose penalties on physicians using telehealth in the event of noncompliance with regulatory requirements under the [place] and Accountability Act (HIPAA) in connection with the good faith provision of telehealth during the TTSVX-79 national public health emergency. ([place])

## 2018-12-06 ENCOUNTER — Encounter: Payer: Self-pay | Admitting: Emergency Medicine

## 2018-12-06 ENCOUNTER — Inpatient Hospital Stay: Payer: Medicare Other

## 2018-12-06 ENCOUNTER — Other Ambulatory Visit: Payer: Self-pay

## 2018-12-06 ENCOUNTER — Emergency Department: Payer: Medicare Other

## 2018-12-06 ENCOUNTER — Inpatient Hospital Stay
Admission: EM | Admit: 2018-12-06 | Discharge: 2018-12-10 | DRG: 482 | Disposition: A | Discharge status: Skilled Nursing Facility | Payer: Medicare Other | Attending: Specialist | Admitting: Specialist

## 2018-12-06 DIAGNOSIS — Z9181 History of falling: Secondary | ICD-10-CM | POA: Diagnosis not present

## 2018-12-06 DIAGNOSIS — H409 Unspecified glaucoma: Secondary | ICD-10-CM | POA: Diagnosis present

## 2018-12-06 DIAGNOSIS — E039 Hypothyroidism, unspecified: Secondary | ICD-10-CM | POA: Diagnosis present

## 2018-12-06 DIAGNOSIS — F329 Major depressive disorder, single episode, unspecified: Secondary | ICD-10-CM | POA: Diagnosis present

## 2018-12-06 DIAGNOSIS — Z7901 Long term (current) use of anticoagulants: Secondary | ICD-10-CM | POA: Diagnosis not present

## 2018-12-06 DIAGNOSIS — E1043 Type 1 diabetes mellitus with diabetic autonomic (poly)neuropathy: Secondary | ICD-10-CM | POA: Diagnosis present

## 2018-12-06 DIAGNOSIS — I10 Essential (primary) hypertension: Secondary | ICD-10-CM | POA: Diagnosis present

## 2018-12-06 DIAGNOSIS — M79601 Pain in right arm: Secondary | ICD-10-CM

## 2018-12-06 DIAGNOSIS — Z8249 Family history of ischemic heart disease and other diseases of the circulatory system: Secondary | ICD-10-CM

## 2018-12-06 DIAGNOSIS — K3184 Gastroparesis: Secondary | ICD-10-CM | POA: Diagnosis present

## 2018-12-06 DIAGNOSIS — E1140 Type 2 diabetes mellitus with diabetic neuropathy, unspecified: Secondary | ICD-10-CM

## 2018-12-06 DIAGNOSIS — S72141A Displaced intertrochanteric fracture of right femur, initial encounter for closed fracture: Principal | ICD-10-CM | POA: Diagnosis present

## 2018-12-06 DIAGNOSIS — E10319 Type 1 diabetes mellitus with unspecified diabetic retinopathy without macular edema: Secondary | ICD-10-CM | POA: Diagnosis present

## 2018-12-06 DIAGNOSIS — G894 Chronic pain syndrome: Secondary | ICD-10-CM | POA: Diagnosis present

## 2018-12-06 DIAGNOSIS — Z794 Long term (current) use of insulin: Secondary | ICD-10-CM

## 2018-12-06 DIAGNOSIS — F0390 Unspecified dementia without behavioral disturbance: Secondary | ICD-10-CM | POA: Diagnosis present

## 2018-12-06 DIAGNOSIS — Z79899 Other long term (current) drug therapy: Secondary | ICD-10-CM | POA: Diagnosis not present

## 2018-12-06 DIAGNOSIS — E1042 Type 1 diabetes mellitus with diabetic polyneuropathy: Secondary | ICD-10-CM

## 2018-12-06 DIAGNOSIS — E785 Hyperlipidemia, unspecified: Secondary | ICD-10-CM | POA: Diagnosis present

## 2018-12-06 DIAGNOSIS — Z8673 Personal history of transient ischemic attack (TIA), and cerebral infarction without residual deficits: Secondary | ICD-10-CM

## 2018-12-06 DIAGNOSIS — Z888 Allergy status to other drugs, medicaments and biological substances status: Secondary | ICD-10-CM

## 2018-12-06 DIAGNOSIS — Z9641 Presence of insulin pump (external) (internal): Secondary | ICD-10-CM | POA: Diagnosis present

## 2018-12-06 DIAGNOSIS — W11XXXA Fall on and from ladder, initial encounter: Secondary | ICD-10-CM | POA: Diagnosis present

## 2018-12-06 DIAGNOSIS — S72009A Fracture of unspecified part of neck of unspecified femur, initial encounter for closed fracture: Secondary | ICD-10-CM

## 2018-12-06 DIAGNOSIS — D638 Anemia in other chronic diseases classified elsewhere: Secondary | ICD-10-CM | POA: Diagnosis present

## 2018-12-06 DIAGNOSIS — Z823 Family history of stroke: Secondary | ICD-10-CM | POA: Diagnosis not present

## 2018-12-06 DIAGNOSIS — Z96612 Presence of left artificial shoulder joint: Secondary | ICD-10-CM | POA: Diagnosis present

## 2018-12-06 DIAGNOSIS — M810 Age-related osteoporosis without current pathological fracture: Secondary | ICD-10-CM | POA: Diagnosis present

## 2018-12-06 DIAGNOSIS — F419 Anxiety disorder, unspecified: Secondary | ICD-10-CM | POA: Diagnosis present

## 2018-12-06 DIAGNOSIS — S72001A Fracture of unspecified part of neck of right femur, initial encounter for closed fracture: Secondary | ICD-10-CM

## 2018-12-06 DIAGNOSIS — E7849 Other hyperlipidemia: Secondary | ICD-10-CM | POA: Diagnosis present

## 2018-12-06 DIAGNOSIS — Z85828 Personal history of other malignant neoplasm of skin: Secondary | ICD-10-CM

## 2018-12-06 DIAGNOSIS — W19XXXA Unspecified fall, initial encounter: Secondary | ICD-10-CM

## 2018-12-06 DIAGNOSIS — Z419 Encounter for procedure for purposes other than remedying health state, unspecified: Secondary | ICD-10-CM

## 2018-12-06 DIAGNOSIS — Z885 Allergy status to narcotic agent status: Secondary | ICD-10-CM

## 2018-12-06 LAB — CBC WITH DIFFERENTIAL/PLATELET
Abs Immature Granulocytes: 0.02 10*3/uL (ref 0.00–0.07)
Basophils Absolute: 0 10*3/uL (ref 0.0–0.1)
Basophils Relative: 0 %
Eosinophils Absolute: 0.3 10*3/uL (ref 0.0–0.5)
Eosinophils Relative: 4 %
HCT: 34.6 % — ABNORMAL LOW (ref 36.0–46.0)
Hemoglobin: 11.5 g/dL — ABNORMAL LOW (ref 12.0–15.0)
Immature Granulocytes: 0 %
Lymphocytes Relative: 23 %
Lymphs Abs: 1.6 10*3/uL (ref 0.7–4.0)
MCH: 30.5 pg (ref 26.0–34.0)
MCHC: 33.2 g/dL (ref 30.0–36.0)
MCV: 91.8 fL (ref 80.0–100.0)
Monocytes Absolute: 0.6 10*3/uL (ref 0.1–1.0)
Monocytes Relative: 10 %
Neutro Abs: 4.2 10*3/uL (ref 1.7–7.7)
Neutrophils Relative %: 63 %
Platelets: 191 10*3/uL (ref 150–400)
RBC: 3.77 MIL/uL — ABNORMAL LOW (ref 3.87–5.11)
RDW: 12.5 % (ref 11.5–15.5)
WBC: 6.7 10*3/uL (ref 4.0–10.5)
nRBC: 0 % (ref 0.0–0.2)

## 2018-12-06 LAB — URINE DRUG SCREEN, QUALITATIVE (ARMC ONLY)
Amphetamines, Ur Screen: NOT DETECTED
Barbiturates, Ur Screen: POSITIVE — AB
Benzodiazepine, Ur Scrn: POSITIVE — AB
Cannabinoid 50 Ng, Ur ~~LOC~~: NOT DETECTED
Cocaine Metabolite,Ur ~~LOC~~: NOT DETECTED
MDMA (Ecstasy)Ur Screen: NOT DETECTED
Methadone Scn, Ur: NOT DETECTED
Opiate, Ur Screen: NOT DETECTED
Phencyclidine (PCP) Ur S: NOT DETECTED
Tricyclic, Ur Screen: NOT DETECTED

## 2018-12-06 LAB — BASIC METABOLIC PANEL
Anion gap: 8 (ref 5–15)
BUN: 22 mg/dL (ref 8–23)
CO2: 30 mmol/L (ref 22–32)
Calcium: 9.6 mg/dL (ref 8.9–10.3)
Chloride: 102 mmol/L (ref 98–111)
Creatinine, Ser: 0.53 mg/dL (ref 0.44–1.00)
GFR calc Af Amer: >60 mL/min (ref >60)
GFR calc non Af Amer: >60 mL/min (ref >60)
Glucose, Bld: 80 mg/dL (ref 70–99)
Potassium: 3.7 mmol/L (ref 3.5–5.1)
Sodium: 140 mmol/L (ref 135–145)

## 2018-12-06 LAB — GLUCOSE, CAPILLARY
Glucose-Capillary: 72 mg/dL (ref 70–99)
Glucose-Capillary: 266 mg/dL — ABNORMAL HIGH (ref 70–99)

## 2018-12-06 LAB — ETHANOL: Alcohol, Ethyl (B): <10 mg/dL (ref <10)

## 2018-12-06 LAB — TYPE AND SCREEN
ABO/RH(D): O POS
Antibody Screen: NEGATIVE

## 2018-12-06 LAB — AMMONIA: Ammonia: <9 umol/L — ABNORMAL LOW (ref 9–35)

## 2018-12-06 LAB — PROTIME-INR
INR: 2 — ABNORMAL HIGH (ref 0.8–1.2)
Prothrombin Time: 22.1 seconds — ABNORMAL HIGH (ref 11.4–15.2)

## 2018-12-06 LAB — TSH: TSH: 1.506 u[IU]/mL (ref 0.350–4.500)

## 2018-12-06 MED ORDER — FENTANYL CITRATE (PF) 100 MCG/2ML IJ SOLN
50.0000 ug | Freq: Once | INTRAMUSCULAR | Status: AC
  Administered 2018-12-06: 17:00:00 50 ug via INTRAVENOUS

## 2018-12-06 MED ORDER — ACETAMINOPHEN 325 MG PO TABS: 650.0000 mg | ORAL_TABLET | Freq: Four times a day (QID) | ORAL | Status: DC | PRN

## 2018-12-06 MED ORDER — POLYETHYLENE GLYCOL 3350 17 G PO PACK: 17.0000 g | PACK | Freq: Every day | ORAL | Status: DC | PRN

## 2018-12-06 MED ORDER — ACETAMINOPHEN 650 MG RE SUPP: 650.0000 mg | Freq: Four times a day (QID) | RECTAL | Status: DC | PRN

## 2018-12-06 MED ORDER — METHOCARBAMOL 500 MG PO TABS
500.0000 mg | ORAL_TABLET | Freq: Four times a day (QID) | ORAL | Status: DC | PRN
  Filled 2018-12-06: qty 1

## 2018-12-06 MED ORDER — CARBOXYMETHYLCELLULOSE SODIUM 0.5 % OP SOLN: 1.0000 [drp] | OPHTHALMIC | Status: DC | PRN

## 2018-12-06 MED ORDER — VENLAFAXINE HCL 37.5 MG PO TABS
75.0000 mg | ORAL_TABLET | Freq: Two times a day (BID) | ORAL | Status: DC
  Administered 2018-12-06 – 2018-12-10 (×7): 75 mg via ORAL
  Filled 2018-12-06 (×9): qty 2

## 2018-12-06 MED ORDER — ONDANSETRON HCL 4 MG PO TABS: 4.0000 mg | ORAL_TABLET | Freq: Four times a day (QID) | ORAL | Status: DC | PRN

## 2018-12-06 MED ORDER — ATORVASTATIN CALCIUM 20 MG PO TABS
40.0000 mg | ORAL_TABLET | Freq: Every day | ORAL | Status: DC
  Administered 2018-12-08 – 2018-12-10 (×3): 40 mg via ORAL
  Filled 2018-12-06 (×3): qty 2

## 2018-12-06 MED ORDER — POLYVINYL ALCOHOL 1.4 % OP SOLN
1.0000 [drp] | OPHTHALMIC | Status: DC | PRN
  Administered 2018-12-07: 1 [drp] via OPHTHALMIC
  Filled 2018-12-06: qty 15

## 2018-12-06 MED ORDER — INSULIN ASPART 100 UNIT/ML ~~LOC~~ SOLN: 0.0000 [IU] | SUBCUTANEOUS | Status: DC

## 2018-12-06 MED ORDER — HYDRALAZINE HCL 20 MG/ML IJ SOLN: 5.0000 mg | INTRAMUSCULAR | Status: DC | PRN

## 2018-12-06 MED ORDER — TIMOLOL MALEATE 0.5 % OP SOLN
1.0000 [drp] | Freq: Two times a day (BID) | OPHTHALMIC | Status: DC
  Administered 2018-12-07 – 2018-12-10 (×7): 1 [drp] via OPHTHALMIC
  Filled 2018-12-06 (×2): qty 5

## 2018-12-06 MED ORDER — MORPHINE SULFATE (PF) 2 MG/ML IV SOLN
2.0000 mg | INTRAVENOUS | Status: DC | PRN
  Administered 2018-12-06 – 2018-12-07 (×3): 2 mg via INTRAVENOUS
  Filled 2018-12-06 (×3): qty 1

## 2018-12-06 MED ORDER — LORATADINE 10 MG PO TABS
10.0000 mg | ORAL_TABLET | Freq: Every day | ORAL | Status: DC
  Administered 2018-12-08 – 2018-12-10 (×3): 10 mg via ORAL
  Filled 2018-12-06 (×3): qty 1

## 2018-12-06 MED ORDER — FENTANYL CITRATE (PF) 100 MCG/2ML IJ SOLN
INTRAMUSCULAR | Status: AC
  Administered 2018-12-06: 17:00:00 50 ug via INTRAVENOUS
  Filled 2018-12-06: qty 2

## 2018-12-06 MED ORDER — BRIMONIDINE TARTRATE-TIMOLOL 0.2-0.5 % OP SOLN
1.0000 [drp] | Freq: Two times a day (BID) | OPHTHALMIC | Status: DC
  Filled 2018-12-06: qty 5

## 2018-12-06 MED ORDER — NETARSUDIL-LATANOPROST 0.02-0.005 % OP SOLN
1.0000 [drp] | Freq: Every day | OPHTHALMIC | Status: DC
  Administered 2018-12-08: 23:00:00 1 [drp] via OPHTHALMIC
  Filled 2018-12-06 (×2): qty 2.5

## 2018-12-06 MED ORDER — VITAMIN K1 10 MG/ML IJ SOLN
10.0000 mg | INTRAVENOUS | Status: AC
  Administered 2018-12-06: 10 mg via INTRAVENOUS
  Filled 2018-12-06: qty 1

## 2018-12-06 MED ORDER — METHOCARBAMOL 1000 MG/10ML IJ SOLN
500.0000 mg | Freq: Four times a day (QID) | INTRAVENOUS | Status: DC | PRN
  Filled 2018-12-06: qty 5

## 2018-12-06 MED ORDER — LOSARTAN POTASSIUM 50 MG PO TABS
50.0000 mg | ORAL_TABLET | Freq: Every day | ORAL | Status: DC
  Administered 2018-12-08 – 2018-12-10 (×3): 50 mg via ORAL
  Filled 2018-12-06 (×3): qty 1

## 2018-12-06 MED ORDER — BRIMONIDINE TARTRATE 0.2 % OP SOLN
1.0000 [drp] | Freq: Two times a day (BID) | OPHTHALMIC | Status: DC
  Administered 2018-12-07 – 2018-12-10 (×7): 1 [drp] via OPHTHALMIC
  Filled 2018-12-06 (×2): qty 5

## 2018-12-06 MED ORDER — SODIUM CHLORIDE 0.9 % IV SOLN
INTRAVENOUS | Status: DC
  Administered 2018-12-06 – 2018-12-07 (×3): via INTRAVENOUS

## 2018-12-06 MED ORDER — ONDANSETRON HCL 4 MG/2ML IJ SOLN
4.0000 mg | Freq: Four times a day (QID) | INTRAMUSCULAR | Status: DC | PRN
  Administered 2018-12-06 – 2018-12-07 (×2): 4 mg via INTRAVENOUS
  Filled 2018-12-06 (×2): qty 2

## 2018-12-06 NOTE — Progress Notes (Signed)
Family Meeting Note  Advance Directive:yes  Today a meeting took place with the Patient and spouse.  Patient is unable to participate due to: altered mental status  The following clinical team members were present during this meeting:MD  The following were discussed:Patient's diagnosis: right hip fracture, Patient's progosis: Unable to determine and Goals for treatment: Full Code  Additional follow-up to be provided: prn  Time spent during discussion:20 minutes  Evette Doffing, MD

## 2018-12-06 NOTE — ED Provider Notes (Addendum)
[facility] Emergency Department Provider Note   ____________________________________________   I have reviewed the triage vital signs and the nursing notes.   HISTORY  Chief Complaint Fall and Hip Injury   History limited by and level 5 caveat due to: poor historian   HPI Erin Daniels is a 73 y.o. female who presents to the emergency department today because of concern for right hip pain after a fall. The patient was a couple of steps up a step ladder when she fell. EMS is unable to say if she hit her head. The patient was not able to give any significant history. Unclear if that is due to pain, medication given by EMS or dementia. The patient was complaining of right hip pain.   Records reviewed. Per medical record review patient has a history of high risk for falls, diabetes, HLD, HTN.   Past Medical History:  Diagnosis Date  . At high risk for falls   . Basal cell carcinoma    right upper leg  . Complication of anesthesia    difficult to arouse  . Diabetes mellitus without complication (HCC)    Type I  . Diabetic neuropathy ([place])   . Diabetic retinopathy ([place])   . Dizziness   . Gastroparesis   . Herniated disc, cervical   . Hypercholesteremia   . Hypertension   . Hypotensive episode   . Hypothyroidism   . Insulin pump in place   . Myofascial muscle pain   . Numbness of arm    left  . Osteoporosis   . Poor vision   . Seizures ([place])   . Slurred speech   . Stroke (cerebrum) ([place])    4/19  . Stroke (HCC)    x5  . Vertigo   . Wears glasses     Patient Active Problem List   Diagnosis Date Noted  . Hypercoagulopathy ([place]) 09/29/2018  . Acute anxiety 09/29/2018  . Hypertension associated with type 1 diabetes mellitus ([place]) 09/19/2018  . Dyslipidemia due to type 1 diabetes mellitus ([place]) 09/19/2018  . Anemia of chronic disease 09/19/2018  . Chronic migraine without aura 09/19/2018  . Chronic non-seasonal allergic rhinitis  09/19/2018  . Major depression, recurrent, chronic ([place]) 09/19/2018  . Diabetic gastroparesis associated with type 1 diabetes mellitus ([place]) 09/19/2018  . Bilateral sacral insufficiency fracture 09/19/2018  . Constipation 09/15/2018  . Acute urinary retention 09/15/2018  . Intractable pain 09/15/2018  . Chronic cerebrovascular accident (CVA) 05/28/2018  . Diabetic polyneuropathy associated with type 1 diabetes mellitus ([place]) 12/10/2017  . Numbness and tingling of both legs 12/10/2017  . Chronic pain syndrome 12/10/2017  . DKA (diabetic ketoacidoses) ([place]) 11/12/2017  . Confusion 11/11/2017  . Hypoglycemia 11/26/2016  . Lactic acid acidosis   . Sepsis ([place])   . 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 Essential hypertension   . Depression   . Right radial fracture 05/29/2015    Past Surgical History:  Procedure Laterality Date  . APPENDECTOMY    . AUGMENTATION MAMMAPLASTY Bilateral 2011  . BILATERAL CARPAL TUNNEL RELEASE    . COLONOSCOPY    . EYE SURGERY    . HEMORROIDECTOMY    . HERNIA REPAIR    . JOINT REPLACEMENT    . OPEN REDUCTION INTERNAL FIXATION (ORIF) DISTAL RADIAL FRACTURE Right 05/29/2015   Procedure: OPEN REDUCTION INTERNAL FIXATION (ORIF) RIGHT DISTAL RADIUS FRACTURE WITH ALLOGRAFT BONE GRAFTING FOR REPAIR AND RECONSTRUCTION;  Surgeon: Roseanne Kaufman,  MD;  Location: [facility];  Service: Orthopedics;  Laterality: Right;  . SHOULDER ARTHROSCOPY Left   . SHOULDER HEMI-ARTHROPLASTY Left   . SYNOVECTOMY    . TONSILLECTOMY    . TRIGGER FINGER RELEASE    . TUBAL LIGATION      Prior to Admission medications   Medication Sig Start Date End Date Taking? Authorizing Provider  acetaminophen (TYLENOL) 325 MG tablet Take 2 tablets (650 mg total) by mouth every 6 (six) hours as needed for headache. For mild pain 09/18/18   Pyreddy, Reatha Harps, MD  ACETAMINOPHEN-BUTALBITAL 50-325 MG TABS Take 1 tablet by mouth 2 (two) times daily as needed  (headache). 11/26/18 02/24/19  Gillis Santa, MD  atorvastatin (LIPITOR) 40 MG tablet Take 1 tablet (40 mg total) by mouth daily. 10/10/18   Medina-Vargas, Monina C, NP  Calcium Carb-Cholecalciferol (CALCIUM 600 + D) 600-200 MG-UNIT TABS Take 1 tablet by mouth every evening.    [provider]  carboxymethylcellulose (REFRESH PLUS) 0.5 % SOLN Place 1 drop into both eyes as needed (for dry eyes). 10/10/18   Medina-Vargas, Monina C, NP  cetirizine (ZYRTEC) 10 MG tablet Take 1 tablet (10 mg total) by mouth daily. 10/10/18   Medina-Vargas, Monina C, NP  Cholecalciferol (VITAMIN D-3) 5000 units TABS Take 5,000 Units by mouth daily.    [provider]  gabapentin (NEURONTIN) 300 MG capsule 300 mg in the morning, 300 mg in the afternoon, 900 mg in the evening 10/10/18   Medina-Vargas, Monina C, NP  Garlic 4128 MG CAPS Take 1 capsule by mouth daily.  09/18/18   [provider]  glucose 4 GM chewable tablet Chew 1 tablet (4 g total) by mouth as needed for low blood sugar. <65 10/10/18   Medina-Vargas, Monina C, NP  insulin aspart (NOVOLOG FLEXPEN) 100 UNIT/ML FlexPen Inject 0-15 Units into the skin 3 (three) times daily with meals. Inject 0-15 units subcutaneous per sliding scale before meals and at bedtime 0 - 100 units = 0 units 101 - 120 = 1 unit 121 - 150 = 2 units 151 - 200 = 4 units 201 - 250 = 6 units 251 - 300 = 8 units 301 - 350 = 10 units 351 - 400 = 12 units 401 - 450 = 15 units > 450 call MD 10/10/18   Medina-Vargas, Monina C, NP  Insulin Glargine (LANTUS SOLOSTAR) 100 UNIT/ML Solostar Pen Inject 12 Units into the skin at bedtime. 10/10/18   Medina-Vargas, Monina C, NP  linaclotide (LINZESS) 72 MCG capsule Take 1 capsule (72 mcg total) by mouth daily for 30 days. 10/10/18 11/09/18  Medina-Vargas, Monina C, NP  losartan (COZAAR) 50 MG tablet Take 1 tablet (50 mg total) by mouth daily for 30 days. 10/10/18 11/09/18  Medina-Vargas, Monina C, NP  Multiple Vitamins-Minerals (CEROVITE  ADVANCED FORMULA PO) Take 1 tablet by mouth daily. 09/18/18   [provider]  NON FORMULARY Diet type:  NCS 09/18/18   [provider]  polyethylene glycol (MIRALAX / GLYCOLAX) packet Take 17 g by mouth daily.     [provider]  senna (SENOKOT) 8.6 MG TABS tablet Take 2 tablets by mouth 2 (two) times daily. 09/19/18   [provider]  venlafaxine (EFFEXOR) 75 MG tablet Take 1 tablet (75 mg total) by mouth 2 (two) times daily. 10/10/18   Medina-Vargas, Monina C, NP  vitamin C (ASCORBIC ACID) 500 MG tablet Take 500 mg by mouth 2 (two) times daily.    [provider]  warfarin (COUMADIN) 5 MG tablet Take 1 tablet (5 mg total) by mouth See admin instructions. Managed by Dr. Merian Capron, Mon, Wed, Fri, Sat 10/10/18   Medina-Vargas, Monina C, NP  warfarin (COUMADIN) 6 MG tablet Take 1 tablet (6 mg total) by mouth daily. Once A Day on Tue, Thu 10/10/18   Medina-Vargas, Monina C, NP    Allergies Codeine; Meperidine; Metoclopramide hcl; Oxycodone; Stadol [butorphanol]; Tussionex pennkinetic er [employer] polst-cpm polst er]; and Carbocaine  [mepivacaine hcl]  Family History  Problem Relation Age of Onset  . Hypertension Mother   . Stroke Mother   . Heart failure Father     Social History Social History   Tobacco Use  . Smoking status: Never Smoker  . Smokeless tobacco: Never Used  Substance Use Topics  . Alcohol use: No  . Drug use: No    Unable to obtain reliable ROS.   ____________________________________________   PHYSICAL EXAM:  VITAL SIGNS: ED Triage Vitals [12/06/18 1549]  Enc Vitals Group     BP (!) 185/87     Pulse Rate 90     Resp (!) 8     Temp 98 F (36.7 C)     Temp Source Axillary     SpO2 94 %   Constitutional: Awake and alert, not oriented to place or events.  Eyes: Conjunctivae are normal.  ENT      Head: Normocephalic and atraumatic.      Nose: No congestion/rhinnorhea.      Mouth/Throat: Mucous membranes are  moist.      Neck: No stridor. Hematological/Lymphatic/Immunilogical: No cervical lymphadenopathy. Cardiovascular: Normal rate, regular rhythm.  Systolic murmur Respiratory: Normal respiratory effort without tachypnea nor retractions. Breath sounds are clear and equal bilaterally. No wheezes/rales/rhonchi. Gastrointestinal: Soft and non tender. No rebound. No guarding.  Genitourinary: Deferred Musculoskeletal: Right hip tenderness to palpation and manipulation.  Neurologic: Not oriented to events or location.. No gross focal neurologic deficits are appreciated.  Skin:  Skin is warm, dry and intact. No rash noted. Psychiatric: Mood and affect are normal. Speech and behavior are normal. Patient exhibits appropriate insight and judgment.  ____________________________________________    LABS (pertinent positives/negatives)  BMP wnl CBC wbc 6.7, hgb 11.5, plt 191 INR 2.0 ____________________________________________   EKG  I, Nance Pear, attending physician, personally viewed and interpreted this EKG  EKG Time: 1552 Rate: 86 Rhythm: sinus rhythm with PAC Axis: normal Intervals: qtc 411 QRS: narrow ST changes: no st elevation Impression: abnormal ekg   ____________________________________________    RADIOLOGY  Right hip Intertrochanteric fever  CT head/cervical spine No acute findings  CXR No acute disease  ____________________________________________   PROCEDURES  Procedures  ____________________________________________   INITIAL IMPRESSION / ASSESSMENT AND PLAN / ED COURSE  Pertinent labs & imaging results that were available during my care of the patient were reviewed by me and considered in my medical decision making (see chart for details).   Patient presented to the emergency department today because of concerns for right hip pain after a fall.  Concern for hip fracture which was confirmed with x-rays.  Did obtain CT of the head and cervical spine  since unclear if there is any trauma patient was not able to stay.  These likely did not show any acute findings.  Will plan on admission to the hospitalist service.  ____________________________________________   FINAL CLINICAL IMPRESSION(S) / ED DIAGNOSES  Final diagnoses:  Fall, initial encounter  Closed fracture of right hip, initial encounter ([place])  Note: This dictation was prepared with Dragon dictation. Any transcriptional errors that result from this process are unintentional      Nance Pear, MD 12/06/18 1649    Nance Pear, MD 12/06/18 [phone]

## 2018-12-06 NOTE — ED Notes (Signed)
Erin Daniels, EDT at bedside to sit with patient. Pt became agitated and repeatedly asked what happened and repeatedly stated "no I didn't do that, no it couldn't be". Pt was attempting to get out of bed despite this RN explaining she fell and needed to stay in bed.

## 2018-12-06 NOTE — ED Notes (Signed)
Pt taken to radiology at this time.

## 2018-12-06 NOTE — Consult Note (Signed)
ORTHOPAEDIC CONSULTATION  REQUESTING PHYSICIAN: Mayo, Pete Pelt, MD  Chief Complaint: Right hip pain status post fall  HPI: Erin Daniels is a 73 y.o. female who complains of right hip pain.  Patient is seen in the emergency department in room 5.  Patient is confused and unable to provide a history.  She does not know where she is and cannot remember why she is here in the hospital.  Patient has a history of type 1 diabetes with an insulin pump.  She has a history of stroke and seizures.  Patient takes coumadin at baseline and INR on admission is 2.0  Past Medical History:  Diagnosis Date  . At high risk for falls   . Basal cell carcinoma    right upper leg  . Complication of anesthesia    difficult to arouse  . Diabetes mellitus without complication (HCC)    Type I  . Diabetic neuropathy ([place])   . Diabetic retinopathy ([place])   . Dizziness   . Gastroparesis   . Herniated disc, cervical   . Hypercholesteremia   . Hypertension   . Hypotensive episode   . Hypothyroidism   . Insulin pump in place   . Myofascial muscle pain   . Numbness of arm    left  . Osteoporosis   . Poor vision   . Seizures ([place])   . Slurred speech   . Stroke (cerebrum) ([place])    4/19  . Stroke (HCC)    x5  . Vertigo   . Wears glasses    Past Surgical History:  Procedure Laterality Date  . APPENDECTOMY    . AUGMENTATION MAMMAPLASTY Bilateral 2011  . BILATERAL CARPAL TUNNEL RELEASE    . COLONOSCOPY    . EYE SURGERY    . HEMORROIDECTOMY    . HERNIA REPAIR    . JOINT REPLACEMENT    . OPEN REDUCTION INTERNAL FIXATION (ORIF) DISTAL RADIAL FRACTURE Right 05/29/2015   Procedure: OPEN REDUCTION INTERNAL FIXATION (ORIF) RIGHT DISTAL RADIUS FRACTURE WITH ALLOGRAFT BONE GRAFTING FOR REPAIR AND RECONSTRUCTION;  Surgeon: Roseanne Kaufman, MD;  Location: [facility];  Service: Orthopedics;  Laterality: Right;  . SHOULDER ARTHROSCOPY Left   . SHOULDER HEMI-ARTHROPLASTY Left   . SYNOVECTOMY    . TONSILLECTOMY     . TRIGGER FINGER RELEASE    . TUBAL LIGATION     Social History   Socioeconomic History  . Marital status: Married    Spouse name: Not on file  . Number of children: Not on file  . Years of education: Not on file  . Highest education level: Not on file  Occupational History  . Not on file  Social Needs  . Financial resource strain: Not on file  . Food insecurity:    Worry: Not on file    Inability: Not on file  . Transportation needs:    Medical: Not on file    Non-medical: Not on file  Tobacco Use  . Smoking status: Never Smoker  . Smokeless tobacco: Never Used  Substance and Sexual Activity  . Alcohol use: No  . Drug use: No  . Sexual activity: Not on file  Lifestyle  . Physical activity:    Days per week: Not on file    Minutes per session: Not on file  . Stress: Not on file  Relationships  . Social connections:    Talks on phone: Not on file    Gets together: Not on file    Attends religious  service: Not on file    Active member of club or organization: Not on file    Attends meetings of clubs or organizations: Not on file    Relationship status: Not on file  Other Topics Concern  . Not on file  Social History Narrative   Lives at home with her husband.  Unsteady gait.  Walks without any assistive device at this time   Family History  Problem Relation Age of Onset  . Hypertension Mother   . Stroke Mother   . Heart failure Father    Allergies  Allergen Reactions  . Codeine Nausea Only and Other (See Comments)    Reaction: stress attacks/ headache Can take Vicodin and Percocet   . Meperidine Nausea Only and Other (See Comments)    Reaction: stress attacks/ headache  . Metoclopramide Hcl Nausea Only and Other (See Comments)    Reaction: stress attacks/ headache  . Oxycodone Nausea Only and Other (See Comments)    Reaction: stress attacks/ headache  . Stadol [Butorphanol] Nausea Only and Other (See Comments)    Reaction: stress attacks/ headache  .  Tussionex Pennkinetic Er [Hydrocod Polst-Cpm Polst Er] Other (See Comments)    Filled patient with sugar   . Carbocaine  [Mepivacaine Hcl] Palpitations   Prior to Admission medications   Medication Sig Start Date End Date Taking? Authorizing Provider  acetaminophen (TYLENOL) 325 MG tablet Take 2 tablets (650 mg total) by mouth every 6 (six) hours as needed for headache. For mild pain 09/18/18   Pyreddy, Reatha Harps, MD  ACETAMINOPHEN-BUTALBITAL 50-325 MG TABS Take 1 tablet by mouth 2 (two) times daily as needed (headache). 11/26/18 02/24/19  Gillis Santa, MD  atorvastatin (LIPITOR) 40 MG tablet Take 1 tablet (40 mg total) by mouth daily. 10/10/18   Medina-Vargas, Monina C, NP  butalbital-acetaminophen-caffeine (FIORICET) 50-325-40 MG tablet Take 1 tablet by mouth 2 (two) times daily. 10/25/18   [provider]  Calcium Carb-Cholecalciferol (CALCIUM 600 + D) 600-200 MG-UNIT TABS Take 1 tablet by mouth every evening.    [provider]  carboxymethylcellulose (REFRESH PLUS) 0.5 % SOLN Place 1 drop into both eyes as needed (for dry eyes). 10/10/18   Medina-Vargas, Monina C, NP  cetirizine (ZYRTEC) 10 MG tablet Take 1 tablet (10 mg total) by mouth daily. 10/10/18   Medina-Vargas, Monina C, NP  Cholecalciferol (VITAMIN D-3) 5000 units TABS Take 5,000 Units by mouth daily.    [provider]  gabapentin (NEURONTIN) 300 MG capsule 300 mg in the morning, 300 mg in the afternoon, 900 mg in the evening 10/10/18   Medina-Vargas, Monina C, NP  Garlic 7322 MG CAPS Take 1 capsule by mouth daily.  09/18/18   [provider]  glucose 4 GM chewable tablet Chew 1 tablet (4 g total) by mouth as needed for low blood sugar. <65 10/10/18   Medina-Vargas, Monina C, NP  insulin aspart (NOVOLOG FLEXPEN) 100 UNIT/ML FlexPen Inject 0-15 Units into the skin 3 (three) times daily with meals. Inject 0-15 units subcutaneous per sliding scale before meals and at bedtime 0 - 100 units = 0 units 101 - 120 = 1  unit 121 - 150 = 2 units 151 - 200 = 4 units 201 - 250 = 6 units 251 - 300 = 8 units 301 - 350 = 10 units 351 - 400 = 12 units 401 - 450 = 15 units > 450 call MD 10/10/18   Medina-Vargas, Monina C, NP  Insulin Glargine (LANTUS SOLOSTAR) 100 UNIT/ML  Solostar Pen Inject 12 Units into the skin at bedtime. 10/10/18   Medina-Vargas, Monina C, NP  linaclotide (LINZESS) 72 MCG capsule Take 1 capsule (72 mcg total) by mouth daily for 30 days. 10/10/18 11/09/18  Medina-Vargas, Monina C, NP  losartan (COZAAR) 50 MG tablet Take 1 tablet (50 mg total) by mouth daily for 30 days. 10/10/18 11/09/18  Medina-Vargas, Monina C, NP  Multiple Vitamins-Minerals (CEROVITE ADVANCED FORMULA PO) Take 1 tablet by mouth daily. 09/18/18   [provider]  NON FORMULARY Diet type:  NCS 09/18/18   [provider]  polyethylene glycol (MIRALAX / GLYCOLAX) packet Take 17 g by mouth daily.     [provider]  senna (SENOKOT) 8.6 MG TABS tablet Take 2 tablets by mouth 2 (two) times daily. 09/19/18   [provider]  venlafaxine (EFFEXOR) 75 MG tablet Take 1 tablet (75 mg total) by mouth 2 (two) times daily. 10/10/18   Medina-Vargas, Monina C, NP  vitamin C (ASCORBIC ACID) 500 MG tablet Take 500 mg by mouth 2 (two) times daily.    [provider]  warfarin (COUMADIN) 5 MG tablet Take 1 tablet (5 mg total) by mouth See admin instructions. Managed by Dr. Merian Capron, Mon, Wed, Fri, Sat 10/10/18   Medina-Vargas, Monina C, NP  warfarin (COUMADIN) 6 MG tablet Take 1 tablet (6 mg total) by mouth daily. Once A Day on Tue, Thu 10/10/18   Nickola Major, NP   Dg Chest 1 View  Result Date: 12/06/2018 CLINICAL DATA:  Right hip fracture, fall. EXAM: CHEST  1 VIEW COMPARISON:  Radiographs of November 11, 2017. FINDINGS: The heart size and mediastinal contours are within normal limits. Both lungs are clear. Atherosclerosis of thoracic aorta is noted. No pneumothorax or pleural effusion is noted.  Status post left shoulder arthroplasty. IMPRESSION: No active disease. Aortic Atherosclerosis (ICD10-I70.0). Electronically Signed   By: Marijo Conception M.D.   On: 12/06/2018 16:36   Ct Head Wo Contrast  Result Date: 12/06/2018 CLINICAL DATA:  Fall down stairs. EXAM: CT HEAD WITHOUT CONTRAST CT CERVICAL SPINE WITHOUT CONTRAST TECHNIQUE: Multidetector CT imaging of the head and cervical spine was performed following the standard protocol without intravenous contrast. Multiplanar CT image reconstructions of the cervical spine were also generated. COMPARISON:  CT of the head on 05/28/2018 FINDINGS: CT HEAD FINDINGS Brain: Stable advanced small vessel ischemic disease of the periventricular white matter and multiple old small vessel infarcts, particularly in the region of the left basal ganglia and internal capsule. The brain demonstrates no evidence of hemorrhage, acute infarction, edema, mass effect, extra-axial fluid collection, hydrocephalus or mass lesion. Vascular: No hyperdense vessel or unexpected calcification. Skull: Normal. Negative for fracture or focal lesion. Sinuses/Orbits: No acute finding. Other: None. CT CERVICAL SPINE FINDINGS Alignment: Mild anterolisthesis C3 on C4 of approximately 3 mm. Skull base and vertebrae: No acute fracture identified. Soft tissues and spinal canal: No prevertebral soft tissue swelling or incidental masses. Disc levels: Moderate degenerative disc disease most prominently at C4-5 and C5-6. Milder degenerative disc disease at C3-4 and C6-7. Upper chest: Negative. Other: Calcified plaque present at both carotid bifurcations. IMPRESSION: 1. 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 No evidence of acute brain injury, hemorrhage or skull fracture. 2. No evidence of acute cervical injury. Moderate degenerative disc disease present at C4-5 and C5-6 with milder disc disease at C3-4 and C6-7. Associated mild anterolisthesis of C3 on C4 of  approximately 3 mm.  Electronically Signed   By: Aletta Edouard M.D.   On: 12/06/2018 16:35   Ct Cervical Spine Wo Contrast  Result Date: 12/06/2018 CLINICAL DATA:  Fall down stairs. EXAM: CT HEAD WITHOUT CONTRAST CT CERVICAL SPINE WITHOUT CONTRAST TECHNIQUE: Multidetector CT imaging of the head and cervical spine was performed following the standard protocol without intravenous contrast. Multiplanar CT image reconstructions of the cervical spine were also generated. COMPARISON:  CT of the head on 05/28/2018 FINDINGS: CT HEAD FINDINGS Brain: Stable advanced small vessel ischemic disease of the periventricular white matter and multiple old small vessel infarcts, particularly in the region of the left basal ganglia and internal capsule. The brain demonstrates no evidence of hemorrhage, acute infarction, edema, mass effect, extra-axial fluid collection, hydrocephalus or mass lesion. Vascular: No hyperdense vessel or unexpected calcification. Skull: Normal. Negative for fracture or focal lesion. Sinuses/Orbits: No acute finding. Other: None. CT CERVICAL SPINE FINDINGS Alignment: Mild anterolisthesis C3 on C4 of approximately 3 mm. Skull base and vertebrae: No acute fracture identified. Soft tissues and spinal canal: No prevertebral soft tissue swelling or incidental masses. Disc levels: Moderate degenerative disc disease most prominently at C4-5 and C5-6. Milder degenerative disc disease at C3-4 and C6-7. Upper chest: Negative. Other: Calcified plaque present at both carotid bifurcations. IMPRESSION: 1. 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 No evidence of acute brain injury, hemorrhage or skull fracture. 2. No evidence of acute cervical injury. Moderate degenerative disc disease present at C4-5 and C5-6 with milder disc disease at C3-4 and C6-7. Associated mild anterolisthesis of C3 on C4 of approximately 3 mm. Electronically Signed   By: Aletta Edouard M.D.   On: 12/06/2018  16:35   Dg Hip Unilat W Or Wo Pelvis 2-3 Views Right  Result Date: 12/06/2018 CLINICAL DATA:  Right hip pain after fall. EXAM: DG HIP (WITH OR WITHOUT PELVIS) 2-3V RIGHT COMPARISON:  None. FINDINGS: Mildly displaced fracture is seen involving the intertrochanteric region of the proximal right femur. Vascular calcifications are noted. Left hip is unremarkable. IMPRESSION: Mildly displaced intertrochanteric fracture of the proximal right femur. Electronically Signed   By: Marijo Conception M.D.   On: 12/06/2018 16:34    Positive ROS: All other systems have been reviewed and were otherwise negative with the exception of those mentioned in the HPI and as above.  Physical Exam: General: Awake, no acute distress.  Complains of right hip pain. Skin: No lesions in the area of chief complaint Neurologic: Sensation intact distally Psychiatric: Patient is confused  MUSCULOSKELETAL: Right lower extremity: The patient skin is intact overlying the right hip.  There is no erythema ecchymosis or significant swelling.  The patient's thigh and leg compartments are soft and compressible.  Patient has palpable pedal pulses, intact sensation light touch intact motor function distally.  Right lower extremity is shortened and externally rotated.  Assessment: Displaced right intertrochanteric hip fracture  Plan: The patient was confused in the emergency department.  She is being admitted to the hospital service for preop evaluation.  I have spoken with the patient's husband by phone to explain to him the nature of the fracture.  I recommended intramedullary fixation for the patient's fracture.    I explained the details of the operation as well as the postoperative course to the patient's husband.  He understands the risks include but are not limited to infection, bleeding requiring blood transfusion, nerve or blood vessel injury, right lower extremity shortening or change in lower extremity rotation,  malunion,  nonunion, persistent pain, hardware failure and the need for further surgery.  He also understands the medical risks of surgery include DVT and pulmonary embolism, myocardial infarction, stroke, pneumonia, respiratory failure and death.  He understands the risks of surgery and wished to proceed.    Patient will be given vitamin K 10 mg IV x1 to reverse her Coumadin.  Patient will be n.p.o. after midnight.  Surgery is scheduled for tomorrow morning.  I have discussed this case via [facility] with Dr. Brett Albino the admitting hospitalist.      Thornton [place], MD    12/06/2018 7:39 PM

## 2018-12-06 NOTE — ED Triage Notes (Signed)
Patient arrives from home after witnessed fall from stair step in kitchen. Complains of R hip pain, denies hitting head/LOC. Husband called EMS; 148mcg fentanyl and 1mg  Versed given en route. R hip appears externally rotated.

## 2018-12-06 NOTE — Anesthesia Preprocedure Evaluation (Addendum)
Anesthesia Evaluation  Patient identified by MRN, date of birth, ID band Patient confused    Reviewed: Allergy & Precautions, H&P , NPO status , Patient's Chart, lab work & pertinent test results  History of Anesthesia Complications (+) PROLONGED EMERGENCE and history of anesthetic complications  Airway Mallampati: III  TM Distance: <3 FB Neck ROM: limited    Dental no notable dental hx. (+) Chipped   Pulmonary neg shortness of breath,    Pulmonary exam normal breath sounds clear to auscultation       Cardiovascular hypertension, Pt. on medications + dysrhythmias Atrial Fibrillation  Rhythm:Regular Rate:Normal     Neuro/Psych  Headaches, Seizures -,  PSYCHIATRIC DISORDERS Anxiety Depression  Neuromuscular disease CVA, Residual Symptoms    GI/Hepatic negative GI ROS, Neg liver ROS,   Endo/Other  diabetes, Insulin DependentHypothyroidism   Renal/GU negative Renal ROS  negative genitourinary   Musculoskeletal negative musculoskeletal ROS (+)   Abdominal   Peds negative pediatric ROS (+)  Hematology  (+) Blood dyscrasia, ,   Anesthesia Other Findings Past Medical History: No date: At high risk for falls No date: Basal cell carcinoma     Comment:  right upper leg No date: Complication of anesthesia     Comment:  difficult to arouse No date: Diabetes mellitus without complication (HCC)     Comment:  Type I No date: Diabetic neuropathy (HCC) No date: Diabetic retinopathy ([facility]) No date: Dizziness No date: Gastroparesis No date: Herniated disc, cervical No date: Hypercholesteremia No date: Hypertension No date: Hypotensive episode No date: Hypothyroidism No date: Insulin pump in place No date: Myofascial muscle pain No date: Numbness of arm     Comment:  left No date: Osteoporosis No date: Poor vision No date: Seizures ([place]) No date: Slurred speech No date: Stroke (cerebrum) ([place])     Comment:  4/19 No  date: Stroke [facility])     Comment:  x5 No date: Vertigo No date: Wears glasses  Reproductive/Obstetrics negative OB ROS                         Anesthesia Physical  Anesthesia Plan  ASA: III  Anesthesia Plan: General   Post-op Pain Management:    Induction: Intravenous  PONV Risk Score and Plan: Ondansetron, Dexamethasone, Midazolam and Treatment may vary due to age or medical condition  Airway Management Planned: Oral ETT  Additional Equipment:   Intra-op Plan:   Post-operative Plan: Extubation in OR  Informed Consent: I have reviewed the patients History and Physical, chart, labs and discussed the procedure including the risks, benefits and alternatives for the proposed anesthesia with the patient or authorized representative who has indicated his/her understanding and acceptance.     Dental advisory given  Plan Discussed with: CRNA and Surgeon  Anesthesia Plan Comments: (Phone consent from husband Kym Scannell at [phone]  Patient has been taking warfarin so she is not a candidate for a spinal  Husband informed that patient is higher risk for complications from anesthesia during this procedure due to her medical history and age including but not limited to post operative cognitive dysfunction.  He voiced understanding.  Husband consented for risks of anesthesia including but not limited to:  - adverse reactions to medications - damage to teeth, lips or other oral mucosa - sore throat or hoarseness - Damage to heart, brain, lungs or loss of life  He voiced understanding.)     Anesthesia Quick Evaluation

## 2018-12-06 NOTE — ED Notes (Signed)
This Probation officer in to sit with pt for safety reasons

## 2018-12-06 NOTE — ED Notes (Signed)
Dr. Cindi Carbon here to see pt.

## 2018-12-06 NOTE — ED Notes (Signed)
Per 1C RN pt is to come up after shift change, Marya Amsler, RN made aware.

## 2018-12-06 NOTE — ED Notes (Signed)
This RN to bedside due to patient attempting to sit up and wiggling around in bed. Pt repeatedly calling out, "help me, help me!" and reaching out and attempting to pull off monitor devices. Pt given 2 warm blankets. Consulted with MD regarding patient repeatedly calling out, VORB for 45mcg of Fentanyl. Administered per MD order. PT now resting quietly in bed. Placed on 2L O2 via [place].

## 2018-12-06 NOTE — ED Notes (Signed)
Pt [employer] insulin pump removed from patient and placed in denture cup and placed in patient's purse.

## 2018-12-06 NOTE — Progress Notes (Signed)
Unable to obtain information on pt for admission assessment. Will notify spouse for further information. Pt very confused and unable to state where she is or why she is here. Pdowless,rn 12/06/2018

## 2018-12-06 NOTE — ED Notes (Addendum)
ED TO INPATIENT HANDOFF REPORT  ED Nurse Name and Phone #:  Jinny Blossom [phone]  S Name/Age/Gender Erin Daniels 73 y.o. female Room/Bed: ED05A/ED05A  Code Status   Code Status: Prior  Home/SNF/Other UTA at this time due to patient condition {Patient oriented to: Pt disoriented x 4.  Is this baseline? No   Triage Complete: Triage complete  Chief Complaint fall, hip pain  Triage Note Patient arrives from home after witnessed fall from stair step in kitchen. Complains of R hip pain, denies hitting head/LOC. Husband called EMS; 176mcg fentanyl and 1mg  Versed given en route. R hip appears externally rotated.   Allergies Allergies  Allergen Reactions  . Codeine Nausea Only and Other (See Comments)    Reaction: stress attacks/ headache Can take Vicodin and Percocet   . Meperidine Nausea Only and Other (See Comments)    Reaction: stress attacks/ headache  . Metoclopramide Hcl Nausea Only and Other (See Comments)    Reaction: stress attacks/ headache  . Oxycodone Nausea Only and Other (See Comments)    Reaction: stress attacks/ headache  . Stadol [Butorphanol] Nausea Only and Other (See Comments)    Reaction: stress attacks/ headache  . Tussionex Pennkinetic Er [Hydrocod Polst-Cpm Polst Er] Other (See Comments)    Filled patient with sugar   . Carbocaine  [Mepivacaine Hcl] Palpitations    Level of Care/Admitting Diagnosis ED Disposition    ED Disposition Condition [facility] Area: [place] [[id number]]  Level of Care: Med-Surg [16]  Covid Evaluation: N/A  Diagnosis: Closed right hip fracture [facility]) [[id number]]  Admitting Physician: Hyman Bible DODD [[id number]]  Attending Physician: Hyman Bible DODD [[id number]]  Estimated length of stay: past midnight tomorrow  Certification:: I certify this patient will need inpatient services for at least 2 midnights  PT Class (Do Not Modify): Inpatient [101]  PT Acc Code (Do Not Modify): Private [1]        B Medical/Surgery History Past Medical History:  Diagnosis Date  . At high risk for falls   . Basal cell carcinoma    right upper leg  . Complication of anesthesia    difficult to arouse  . Diabetes mellitus without complication (HCC)    Type I  . Diabetic neuropathy ([place])   . Diabetic retinopathy ([place])   . Dizziness   . Gastroparesis   . Herniated disc, cervical   . Hypercholesteremia   . Hypertension   . Hypotensive episode   . Hypothyroidism   . Insulin pump in place   . Myofascial muscle pain   . Numbness of arm    left  . Osteoporosis   . Poor vision   . Seizures ([place])   . Slurred speech   . 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 Stroke (HCC)    x5  . Vertigo   . Wears glasses    Past Surgical History:  Procedure Laterality Date  . APPENDECTOMY    . AUGMENTATION MAMMAPLASTY Bilateral 2011  . BILATERAL CARPAL TUNNEL RELEASE    . COLONOSCOPY    . EYE SURGERY    . HEMORROIDECTOMY    . HERNIA REPAIR    . JOINT REPLACEMENT    . OPEN REDUCTION INTERNAL FIXATION (ORIF) DISTAL RADIAL FRACTURE Right 05/29/2015   Procedure: OPEN REDUCTION INTERNAL FIXATION (ORIF) RIGHT DISTAL RADIUS FRACTURE WITH ALLOGRAFT BONE GRAFTING FOR REPAIR AND RECONSTRUCTION;  Surgeon: Roseanne Kaufman, MD;  Location: [facility];  Service: Orthopedics;  Laterality: Right;  .  SHOULDER ARTHROSCOPY Left   . SHOULDER HEMI-ARTHROPLASTY Left   . SYNOVECTOMY    . TONSILLECTOMY    . TRIGGER FINGER RELEASE    . TUBAL LIGATION       A IV Location/Drains/Wounds Patient Lines/Drains/Airways Status   Active Line/Drains/Airways    Name:   Placement date:   Placement time:   Site:   Days:   Peripheral IV 12/06/18 Left Antecubital   12/06/18    1545    Antecubital   less than 1   Urethral Catheter Stephanie, EDT Double-lumen 14 Fr.   12/06/18    1750    Double-lumen   less than 1          Intake/Output Last 24 hours No intake or output data in the 24 hours ending 12/06/18 1825  Labs/Imaging Results  for orders placed or performed during the hospital encounter of 12/06/18 (from the past 48 hour(s))  CBC with Differential     Status: Abnormal   Collection Time: 12/06/18  3:46 PM  Result Value Ref Range   WBC 6.7 4.0 - 10.5 K/uL   RBC 3.77 (L) 3.87 - 5.11 MIL/uL   Hemoglobin 11.5 (L) 12.0 - 15.0 g/dL   HCT 34.6 (L) 36.0 - 46.0 %   MCV 91.8 80.0 - 100.0 fL   MCH 30.5 26.0 - 34.0 pg   MCHC 33.2 30.0 - 36.0 g/dL   RDW 12.5 11.5 - 15.5 %   Platelets 191 150 - 400 K/uL   nRBC 0.0 0.0 - 0.2 %   Neutrophils Relative % 63 %   Neutro Abs 4.2 1.7 - 7.7 K/uL   Lymphocytes Relative 23 %   Lymphs Abs 1.6 0.7 - 4.0 K/uL   Monocytes Relative 10 %   Monocytes Absolute 0.6 0.1 - 1.0 K/uL   Eosinophils Relative 4 %   Eosinophils Absolute 0.3 0.0 - 0.5 K/uL   Basophils Relative 0 %   Basophils Absolute 0.0 0.0 - 0.1 K/uL   Immature Granulocytes 0 %   Abs Immature Granulocytes 0.02 0.00 - 0.07 K/uL    Comment: Performed at [facility], [address] [place], [place] [address]  Basic metabolic panel     Status: None   Collection Time: 12/06/18  3:46 PM  Result Value Ref Range   Sodium 140 135 - 145 mmol/L   Potassium 3.7 3.5 - 5.1 mmol/L   Chloride 102 98 - 111 mmol/L   CO2 30 22 - 32 mmol/L   Glucose, Bld 80 70 - 99 mg/dL   BUN 22 8 - 23 mg/dL   Creatinine, Ser 0.53 0.44 - 1.00 mg/dL   Calcium 9.6 8.9 - 10.3 mg/dL   GFR calc non Af Amer >60 >60 mL/min   GFR calc Af Amer >60 >60 mL/min   Anion gap 8 5 - 15    Comment: Performed at [facility], [address] [place], [place] [address]  Protime-INR     Status: Abnormal   Collection Time: 12/06/18  3:46 PM  Result Value Ref Range   Prothrombin Time 22.1 (H) 11.4 - 15.2 seconds   INR 2.0 (H) 0.8 - 1.2    Comment: (NOTE) INR goal varies based on device and disease states. Performed at [facility], [address] [place], [place] [address]   Glucose, capillary     Status: None   Collection Time:  12/06/18  6:04 PM  Result Value Ref Range   Glucose-Capillary 72 70 - 99  mg/dL   Dg Chest 1 View  Result Date: 12/06/2018 CLINICAL DATA:  Right hip fracture, fall. EXAM: CHEST  1 VIEW COMPARISON:  Radiographs of November 11, 2017. FINDINGS: The heart size and mediastinal contours are within normal limits. Both lungs are clear. Atherosclerosis of thoracic aorta is noted. No pneumothorax or pleural effusion is noted. Status post left shoulder arthroplasty. IMPRESSION: No active disease. Aortic Atherosclerosis (ICD10-I70.0). Electronically Signed   By: Marijo Conception M.D.   On: 12/06/2018 16:36   Ct Head Wo Contrast  Result Date: 12/06/2018 CLINICAL DATA:  Fall down stairs. EXAM: CT HEAD WITHOUT CONTRAST CT CERVICAL SPINE WITHOUT CONTRAST TECHNIQUE: Multidetector CT imaging of the head and cervical spine was performed following the standard protocol without intravenous contrast. Multiplanar CT image reconstructions of the cervical spine were also generated. COMPARISON:  CT of the head on 05/28/2018 FINDINGS: CT HEAD FINDINGS Brain: Stable advanced small vessel ischemic disease of the periventricular white matter and multiple old small vessel infarcts, particularly in the region of the left basal ganglia and internal capsule. The brain demonstrates no evidence of hemorrhage, acute infarction, edema, mass effect, extra-axial fluid collection, hydrocephalus or mass lesion. Vascular: No hyperdense vessel or unexpected calcification. Skull: Normal. Negative for fracture or focal lesion. Sinuses/Orbits: No acute finding. Other: None. CT CERVICAL SPINE FINDINGS Alignment: Mild anterolisthesis C3 on C4 of approximately 3 mm. Skull base and vertebrae: No acute fracture identified. Soft tissues and spinal canal: No prevertebral soft tissue swelling or incidental masses. Disc levels: Moderate degenerative disc disease most prominently at C4-5 and C5-6. Milder degenerative disc disease at C3-4 and C6-7. Upper chest:  Negative. Other: Calcified plaque present at both carotid bifurcations. IMPRESSION: 1. 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 No evidence of acute brain injury, hemorrhage or skull fracture. 2. No evidence of acute cervical injury. Moderate degenerative disc disease present at C4-5 and C5-6 with milder disc disease at C3-4 and C6-7. Associated mild anterolisthesis of C3 on C4 of approximately 3 mm. Electronically Signed   By: Aletta Edouard M.D.   On: 12/06/2018 16:35   Ct Cervical Spine Wo Contrast  Result Date: 12/06/2018 CLINICAL DATA:  Fall down stairs. EXAM: CT HEAD WITHOUT CONTRAST CT CERVICAL SPINE WITHOUT CONTRAST TECHNIQUE: Multidetector CT imaging of the head and cervical spine was performed following the standard protocol without intravenous contrast. Multiplanar CT image reconstructions of the cervical spine were also generated. COMPARISON:  CT of the head on 05/28/2018 FINDINGS: CT HEAD FINDINGS Brain: Stable advanced small vessel ischemic disease of the periventricular white matter and multiple old small vessel infarcts, particularly in the region of the left basal ganglia and internal capsule. The brain demonstrates no evidence of hemorrhage, acute infarction, edema, mass effect, extra-axial fluid collection, hydrocephalus or mass lesion. Vascular: No hyperdense vessel or unexpected calcification. Skull: Normal. Negative for fracture or focal lesion. Sinuses/Orbits: No acute finding. Other: None. CT CERVICAL SPINE FINDINGS Alignment: Mild anterolisthesis C3 on C4 of approximately 3 mm. Skull base and vertebrae: No acute fracture identified. Soft tissues and spinal canal: No prevertebral soft tissue swelling or incidental masses. Disc levels: Moderate degenerative disc disease most prominently at C4-5 and C5-6. Milder degenerative disc disease at C3-4 and C6-7. Upper chest: Negative. Other: Calcified plaque present at both carotid bifurcations. IMPRESSION: 1.  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 No evidence of acute brain injury, hemorrhage or skull fracture. 2. No evidence of acute cervical injury.  Moderate degenerative disc disease present at C4-5 and C5-6 with milder disc disease at C3-4 and C6-7. Associated mild anterolisthesis of C3 on C4 of approximately 3 mm. Electronically Signed   By: Aletta Edouard M.D.   On: 12/06/2018 16:35   Dg Hip Unilat W Or Wo Pelvis 2-3 Views Right  Result Date: 12/06/2018 CLINICAL DATA:  Right hip pain after fall. EXAM: DG HIP (WITH OR WITHOUT PELVIS) 2-3V RIGHT COMPARISON:  None. FINDINGS: Mildly displaced fracture is seen involving the intertrochanteric region of the proximal right femur. Vascular calcifications are noted. Left hip is unremarkable. IMPRESSION: Mildly displaced intertrochanteric fracture of the proximal right femur. Electronically Signed   By: Marijo Conception M.D.   On: 12/06/2018 16:34    Pending Labs [employer] (From admission, onward)    Start     Ordered   Signed and Held  Protime-INR  Tomorrow morning,   R     Signed and Occupational psychologist and Occupational hygienist morning,   R     Signed and Held   Visual merchandiser and Held  CBC  Tomorrow morning,   R     Signed and Held   Visual merchandiser and Held  Protime-INR  Tomorrow morning,   R     Signed and Held   Visual merchandiser and Held  Urine Drug Screen, Biomedical engineer ([facility] only)  Once,   R     Signed and Held   Visual merchandiser and Held  Ethanol  Once,   R     Signed and Held   Signed and Held  TSH  Once,   R     Signed and Held   Signed and Held  Ammonia  Once,   R     Signed and Held          Vitals/Pain Today's Vitals   12/06/18 1549 12/06/18 1630 12/06/18 1700 12/06/18 1730  BP: (!) 185/87 (!) 181/87 (!) 199/92 (!) 178/82  Pulse: 90 77 80 89  Resp: (!) 8 16    Temp: 98 F (36.7 C)     TempSrc: Axillary     SpO2: 94% 97% 95% 100%    Isolation Precautions No active isolations  Medications Medications   fentaNYL (SUBLIMAZE) injection 50 mcg (50 mcg Intravenous Given 12/06/18 1650)    Mobility UTA due to patient condition  High fall risk   Focused Assessments See assessments.   R Recommendations: See Admitting Provider Note  Report given to:   Additional Notes:  Insulin pump removed and placed in denture cup with patient label and placed in patients purse due to patient being unable to manage on her own.

## 2018-12-06 NOTE — Progress Notes (Signed)
Speech eval to be ordered. Pt unable to swallow liquids or applesauce for medication. Pdowless, rn 12/06/2018

## 2018-12-06 NOTE — H&P (Signed)
[facility] NAME: Erin Daniels    MR#:  [id number]  DATE OF BIRTH:  1946/04/18  DATE OF ADMISSION:  12/06/2018  PRIMARY CARE PHYSICIAN: Juluis Pitch, MD   REQUESTING/REFERRING PHYSICIAN: Nance Pear, MD  CHIEF COMPLAINT:   Chief Complaint  Patient presents with   Fall   Hip Injury    HISTORY OF PRESENT ILLNESS:  Erin Daniels  is a 73 y.o. female with a known history of hypertension, type 1 diabetes, hypothyroidism, hyperlipidemia, history of stroke, history seizures who presented to the ED after falling and missed her step in the kitchen.  Patient complains of right arm pain and right hip pain.  She denies any numbness or tingling of her right arm or leg.  She denies any head trauma, headache, loss of consciousness, changes in vision.  In the ED, she was hypertensive.  Labs are significant for INR 2.0.  CT head and CT C-spine were negative for acute abnormalities.  Chest x-ray was unremarkable.  Right hip x-ray showed a displaced right hip fracture.  Hospitalists were called for admission.  PAST MEDICAL HISTORY:   Past Medical History:  Diagnosis Date   At high risk for falls    Basal cell carcinoma    right upper leg   Complication of anesthesia    difficult to arouse   Diabetes mellitus without complication (HCC)    Type I   Diabetic neuropathy (HCC)    Diabetic retinopathy (HCC)    Dizziness    Gastroparesis    Herniated disc, cervical    Hypercholesteremia    Hypertension    Hypotensive episode    Hypothyroidism    Insulin pump in place    Myofascial muscle pain    Numbness of arm    left   Osteoporosis    Poor vision    Seizures (HCC)    Slurred speech    Stroke (cerebrum) ([place])    4/19   Stroke ([place])    x5   Vertigo    Wears glasses     PAST SURGICAL HISTORY:   Past Surgical History:  Procedure Laterality Date   APPENDECTOMY     AUGMENTATION  MAMMAPLASTY Bilateral 2011   BILATERAL CARPAL TUNNEL RELEASE     COLONOSCOPY     EYE SURGERY     HEMORROIDECTOMY     HERNIA REPAIR     JOINT REPLACEMENT     OPEN REDUCTION INTERNAL FIXATION (ORIF) DISTAL RADIAL FRACTURE Right 05/29/2015   Procedure: OPEN REDUCTION INTERNAL FIXATION (ORIF) RIGHT DISTAL RADIUS FRACTURE WITH ALLOGRAFT BONE GRAFTING FOR REPAIR AND RECONSTRUCTION;  Surgeon: Roseanne Kaufman, MD;  Location: [facility];  Service: Orthopedics;  Laterality: Right;   SHOULDER ARTHROSCOPY Left    SHOULDER HEMI-ARTHROPLASTY Left    SYNOVECTOMY     TONSILLECTOMY     TRIGGER FINGER RELEASE     TUBAL LIGATION      SOCIAL HISTORY:   Social History   Tobacco Use   Smoking status: Never Smoker   Smokeless tobacco: Never Used  Substance Use Topics   Alcohol use: No    FAMILY HISTORY:   Family History  Problem Relation Age of Onset   Hypertension Mother    Stroke Mother    Heart failure Father     DRUG ALLERGIES:   Allergies  Allergen Reactions   Codeine Nausea Only and Other (See Comments)    Reaction: stress attacks/ headache Can take Vicodin and Percocet  Meperidine Nausea Only and Other (See Comments)    Reaction: stress attacks/ headache   Metoclopramide Hcl Nausea Only and Other (See Comments)    Reaction: stress attacks/ headache   Oxycodone Nausea Only and Other (See Comments)    Reaction: stress attacks/ headache   Stadol [Butorphanol] Nausea Only and Other (See Comments)    Reaction: stress attacks/ headache   Tussionex Pennkinetic Er [Hydrocod Polst-Cpm Polst Er] Other (See Comments)    Filled patient with sugar    Carbocaine  [Mepivacaine Hcl] Palpitations    REVIEW OF SYSTEMS:   ROS-unable to obtain secondary to altered mental status  MEDICATIONS AT HOME:   Prior to Admission medications   Medication Sig Start Date End Date Taking? Authorizing Provider  acetaminophen (TYLENOL) 325 MG tablet Take 2 tablets (650 mg total) by  mouth every 6 (six) hours as needed for headache. For mild pain 09/18/18   Pyreddy, Reatha Harps, MD  ACETAMINOPHEN-BUTALBITAL 50-325 MG TABS Take 1 tablet by mouth 2 (two) times daily as needed (headache). 11/26/18 02/24/19  Gillis Santa, MD  atorvastatin (LIPITOR) 40 MG tablet Take 1 tablet (40 mg total) by mouth daily. 10/10/18   Medina-Vargas, Monina C, NP  Calcium Carb-Cholecalciferol (CALCIUM 600 + D) 600-200 MG-UNIT TABS Take 1 tablet by mouth every evening.    [provider]  carboxymethylcellulose (REFRESH PLUS) 0.5 % SOLN Place 1 drop into both eyes as needed (for dry eyes). 10/10/18   Medina-Vargas, Monina C, NP  cetirizine (ZYRTEC) 10 MG tablet Take 1 tablet (10 mg total) by mouth daily. 10/10/18   Medina-Vargas, Monina C, NP  Cholecalciferol (VITAMIN D-3) 5000 units TABS Take 5,000 Units by mouth daily.    [provider]  gabapentin (NEURONTIN) 300 MG capsule 300 mg in the morning, 300 mg in the afternoon, 900 mg in the evening 10/10/18   Medina-Vargas, Monina C, NP  Garlic 6440 MG CAPS Take 1 capsule by mouth daily.  09/18/18   [provider]  glucose 4 GM chewable tablet Chew 1 tablet (4 g total) by mouth as needed for low blood sugar. <65 10/10/18   Medina-Vargas, Monina C, NP  insulin aspart (NOVOLOG FLEXPEN) 100 UNIT/ML FlexPen Inject 0-15 Units into the skin 3 (three) times daily with meals. Inject 0-15 units subcutaneous per sliding scale before meals and at bedtime 0 - 100 units = 0 units 101 - 120 = 1 unit 121 - 150 = 2 units 151 - 200 = 4 units 201 - 250 = 6 units 251 - 300 = 8 units 301 - 350 = 10 units 351 - 400 = 12 units 401 - 450 = 15 units > 450 call MD 10/10/18   Medina-Vargas, Monina C, NP  Insulin Glargine (LANTUS SOLOSTAR) 100 UNIT/ML Solostar Pen Inject 12 Units into the skin at bedtime. 10/10/18   Medina-Vargas, Monina C, NP  linaclotide (LINZESS) 72 MCG capsule Take 1 capsule (72 mcg total) by mouth daily for 30 days. 10/10/18 11/09/18   Medina-Vargas, Monina C, NP  losartan (COZAAR) 50 MG tablet Take 1 tablet (50 mg total) by mouth daily for 30 days. 10/10/18 11/09/18  Medina-Vargas, Monina C, NP  Multiple Vitamins-Minerals (CEROVITE ADVANCED FORMULA PO) Take 1 tablet by mouth daily. 09/18/18   [provider]  NON FORMULARY Diet type:  NCS 09/18/18   [provider]  polyethylene glycol (MIRALAX / GLYCOLAX) packet Take 17 g by mouth daily.     [provider]  senna (SENOKOT) 8.6 MG TABS tablet  Take 2 tablets by mouth 2 (two) times daily. 09/19/18   [provider]  venlafaxine (EFFEXOR) 75 MG tablet Take 1 tablet (75 mg total) by mouth 2 (two) times daily. 10/10/18   Medina-Vargas, Monina C, NP  vitamin C (ASCORBIC ACID) 500 MG tablet Take 500 mg by mouth 2 (two) times daily.    [provider]  warfarin (COUMADIN) 5 MG tablet Take 1 tablet (5 mg total) by mouth See admin instructions. Managed by Dr. Merian Capron, Mon, Wed, Fri, Sat 10/10/18   Medina-Vargas, Monina C, NP  warfarin (COUMADIN) 6 MG tablet Take 1 tablet (6 mg total) by mouth daily. Once A Day on Tue, Thu 10/10/18   Medina-Vargas, Monina C, NP      VITAL SIGNS:  Blood pressure (!) 185/87, pulse 90, temperature 98 F (36.7 C), temperature source Axillary, resp. rate (!) 8, SpO2 94 %.  PHYSICAL EXAMINATION:  Physical Exam  GENERAL:  73 y.o.-year-old patient lying in the bed with no acute distress. + Confused and mumbling.  Trying to get out of bed. EYES: Pupils equal, round, reactive to light and accommodation. No scleral icterus. Extraocular muscles intact.  HEENT: Head atraumatic, normocephalic. Oropharynx and nasopharynx clear.  NECK:  Supple, no jugular venous distention. No thyroid enlargement, no tenderness.  LUNGS: Normal breath sounds bilaterally, no wheezing, rales,rhonchi or crepitation. No use of accessory muscles of respiration.  CARDIOVASCULAR: RRR, S1, S2 normal. No murmurs, rubs, or gallops.  ABDOMEN: Soft,  nontender, nondistended. Bowel sounds present. No organomegaly or mass.  EXTREMITIES: No pedal edema, cyanosis, or clubbing. + Right leg is shortened and externally rotated. NEUROLOGIC: Moving all extremities.  No obvious focal deficits.  Does not follow commands. PSYCHIATRIC: The patient is alert.  Oriented only to self. SKIN: No obvious rash, lesion, or ulcer.   LABORATORY PANEL:   CBC Recent Labs  Lab 12/06/18 1546  WBC 6.7  HGB 11.5*  HCT 34.6*  PLT 191   ------------------------------------------------------------------------------------------------------------------  Chemistries  Recent Labs  Lab 12/06/18 1546  NA 140  K 3.7  CL 102  CO2 30  GLUCOSE 80  BUN 22  CREATININE 0.53  CALCIUM 9.6   ------------------------------------------------------------------------------------------------------------------  Cardiac Enzymes No results for input(s): TROPONINI in the last 168 hours. ------------------------------------------------------------------------------------------------------------------  RADIOLOGY:  Dg Chest 1 View  Result Date: 12/06/2018 CLINICAL DATA:  Right hip fracture, fall. EXAM: CHEST  1 VIEW COMPARISON:  Radiographs of November 11, 2017. FINDINGS: The heart size and mediastinal contours are within normal limits. Both lungs are clear. Atherosclerosis of thoracic aorta is noted. No pneumothorax or pleural effusion is noted. Status post left shoulder arthroplasty. IMPRESSION: No active disease. Aortic Atherosclerosis (ICD10-I70.0). Electronically Signed   By: Marijo Conception M.D.   On: 12/06/2018 16:36   Ct Head Wo Contrast  Result Date: 12/06/2018 CLINICAL DATA:  Fall down stairs. EXAM: CT HEAD WITHOUT CONTRAST CT CERVICAL SPINE WITHOUT CONTRAST TECHNIQUE: Multidetector CT imaging of the head and cervical spine was performed following the standard protocol without intravenous contrast. Multiplanar CT image reconstructions of the cervical spine were also  generated. COMPARISON:  CT of the head on 05/28/2018 FINDINGS: CT HEAD FINDINGS Brain: Stable advanced small vessel ischemic disease of the periventricular white matter and multiple old small vessel infarcts, particularly in the region of the left basal ganglia and internal capsule. The brain demonstrates no evidence of hemorrhage, acute infarction, edema, mass effect, extra-axial fluid collection, hydrocephalus or mass lesion. Vascular: No hyperdense vessel or unexpected calcification. Skull:  Normal. Negative for fracture or focal lesion. Sinuses/Orbits: No acute finding. Other: None. CT CERVICAL SPINE FINDINGS Alignment: Mild anterolisthesis C3 on C4 of approximately 3 mm. Skull base and vertebrae: No acute fracture identified. Soft tissues and spinal canal: No prevertebral soft tissue swelling or incidental masses. Disc levels: Moderate degenerative disc disease most prominently at C4-5 and C5-6. Milder degenerative disc disease at C3-4 and C6-7. Upper chest: Negative. Other: Calcified plaque present at both carotid bifurcations. IMPRESSION: 1. 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 No evidence of acute brain injury, hemorrhage or skull fracture. 2. No evidence of acute cervical injury. Moderate degenerative disc disease present at C4-5 and C5-6 with milder disc disease at C3-4 and C6-7. Associated mild anterolisthesis of C3 on C4 of approximately 3 mm. Electronically Signed   By: Aletta Edouard M.D.   On: 12/06/2018 16:35   Ct Cervical Spine Wo Contrast  Result Date: 12/06/2018 CLINICAL DATA:  Fall down stairs. EXAM: CT HEAD WITHOUT CONTRAST CT CERVICAL SPINE WITHOUT CONTRAST TECHNIQUE: Multidetector CT imaging of the head and cervical spine was performed following the standard protocol without intravenous contrast. Multiplanar CT image reconstructions of the cervical spine were also generated. COMPARISON:  CT of the head on 05/28/2018 FINDINGS: CT HEAD FINDINGS Brain:  Stable advanced small vessel ischemic disease of the periventricular white matter and multiple old small vessel infarcts, particularly in the region of the left basal ganglia and internal capsule. The brain demonstrates no evidence of hemorrhage, acute infarction, edema, mass effect, extra-axial fluid collection, hydrocephalus or mass lesion. Vascular: No hyperdense vessel or unexpected calcification. Skull: Normal. Negative for fracture or focal lesion. Sinuses/Orbits: No acute finding. Other: None. CT CERVICAL SPINE FINDINGS Alignment: Mild anterolisthesis C3 on C4 of approximately 3 mm. Skull base and vertebrae: No acute fracture identified. Soft tissues and spinal canal: No prevertebral soft tissue swelling or incidental masses. Disc levels: Moderate degenerative disc disease most prominently at C4-5 and C5-6. Milder degenerative disc disease at C3-4 and C6-7. Upper chest: Negative. Other: Calcified plaque present at both carotid bifurcations. IMPRESSION: 1. 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 No evidence of acute brain injury, hemorrhage or skull fracture. 2. No evidence of acute cervical injury. Moderate degenerative disc disease present at C4-5 and C5-6 with milder disc disease at C3-4 and C6-7. Associated mild anterolisthesis of C3 on C4 of approximately 3 mm. Electronically Signed   By: Aletta Edouard M.D.   On: 12/06/2018 16:35   Dg Hip Unilat W Or Wo Pelvis 2-3 Views Right  Result Date: 12/06/2018 CLINICAL DATA:  Right hip pain after fall. EXAM: DG HIP (WITH OR WITHOUT PELVIS) 2-3V RIGHT COMPARISON:  None. FINDINGS: Mildly displaced fracture is seen involving the intertrochanteric region of the proximal right femur. Vascular calcifications are noted. Left hip is unremarkable. IMPRESSION: Mildly displaced intertrochanteric fracture of the proximal right femur. Electronically Signed   By: Marijo Conception M.D.   On: 12/06/2018 16:34      IMPRESSION AND PLAN:    Right hip fracture- after falling off a stair step in the kitchen.  INR 2.0. -Ortho following- plan for surgery tomorrow -Will make patient n.p.o. at midnight -Will give a dose of vitamin K 10 mg IV x1 tonight and recheck INR in the morning. -Holding anticoagulation for now -Pain control  Right arm pain- patient states that her right arm hurts from her shoulder to her wrist after the fall. -Check x-rays to  rule out fracture  Altered mental status- may be delirium secondary to pain or to the pain meds that she has received.  CT head negative. -Check alcohol level, UDS, TSH, ammonia to rule out other causes -Sitter at bedside due to patient continuously trying to get up out of bed  Type 1 diabetes- blood sugar well-controlled in the ED -Holding Lantus while patient is n.p.o. -CBGs and SSI every 4 hours  Hypertension-blood pressures elevated in the ED -Continue home losartan -IV hydralazine PRN  Hyperlipidemia-stable -Continue home Lipitor  Normocytic anemia- chronic issue. Hgb better than baseline. No active bleeding. -Monitor closely in the postop period  All the records are reviewed and case discussed with ED provider. Management plans discussed with the patient, family and they are in agreement.  CODE STATUS: Full  TOTAL TIME TAKING CARE OF THIS PATIENT: 45 minutes.    Berna Spare Karlei Waldo M.D on 12/06/2018 at 5:15 PM  Between 7am to 6pm - Pager - [phone]  After 6pm go to [email or web address] - Proofreader  [facility]  Office  [phone]  CC: Primary care physician; Juluis Pitch, MD   Note: This dictation was prepared with Dragon dictation along with smaller phrase technology. Any transcriptional errors that result from this process are unintentional.

## 2018-12-06 NOTE — ED Notes (Signed)
Pt attempting to get out of bed and asking "what happened to me" this tech redirected pt to lay back down and explained that she was in the hospital

## 2018-12-07 ENCOUNTER — Inpatient Hospital Stay: Payer: Medicare Other | Admitting: Anesthesiology

## 2018-12-07 ENCOUNTER — Encounter: Payer: Self-pay | Admitting: Anesthesiology

## 2018-12-07 ENCOUNTER — Inpatient Hospital Stay: Payer: Medicare Other

## 2018-12-07 ENCOUNTER — Encounter
Admission: EM | Disposition: A | Discharge status: Skilled Nursing Facility | Payer: Self-pay | Source: Home / Self Care | Attending: Specialist

## 2018-12-07 HISTORY — PX: INTRAMEDULLARY (IM) NAIL INTERTROCHANTERIC: SHX5875

## 2018-12-07 LAB — BASIC METABOLIC PANEL
Anion gap: 13 (ref 5–15)
BUN: 22 mg/dL (ref 8–23)
CO2: 23 mmol/L (ref 22–32)
Calcium: 9 mg/dL (ref 8.9–10.3)
Chloride: 99 mmol/L (ref 98–111)
Creatinine, Ser: 0.58 mg/dL (ref 0.44–1.00)
GFR calc Af Amer: >60 mL/min (ref >60)
GFR calc non Af Amer: >60 mL/min (ref >60)
Glucose, Bld: 402 mg/dL — ABNORMAL HIGH (ref 70–99)
Potassium: 4.6 mmol/L (ref 3.5–5.1)
Sodium: 135 mmol/L (ref 135–145)

## 2018-12-07 LAB — GLUCOSE, CAPILLARY
Glucose-Capillary: 140 mg/dL — ABNORMAL HIGH (ref 70–99)
Glucose-Capillary: 196 mg/dL — ABNORMAL HIGH (ref 70–99)
Glucose-Capillary: 196 mg/dL — ABNORMAL HIGH (ref 70–99)
Glucose-Capillary: 213 mg/dL — ABNORMAL HIGH (ref 70–99)
Glucose-Capillary: 286 mg/dL — ABNORMAL HIGH (ref 70–99)
Glucose-Capillary: 353 mg/dL — ABNORMAL HIGH (ref 70–99)
Glucose-Capillary: 391 mg/dL — ABNORMAL HIGH (ref 70–99)
Glucose-Capillary: 70 mg/dL (ref 70–99)

## 2018-12-07 LAB — CBC
HCT: 35.6 % — ABNORMAL LOW (ref 36.0–46.0)
Hemoglobin: 11.6 g/dL — ABNORMAL LOW (ref 12.0–15.0)
MCH: 30.4 pg (ref 26.0–34.0)
MCHC: 32.6 g/dL (ref 30.0–36.0)
MCV: 93.4 fL (ref 80.0–100.0)
Platelets: 173 10*3/uL (ref 150–400)
RBC: 3.81 MIL/uL — ABNORMAL LOW (ref 3.87–5.11)
RDW: 12.2 % (ref 11.5–15.5)
WBC: 9.5 10*3/uL (ref 4.0–10.5)
nRBC: 0 % (ref 0.0–0.2)

## 2018-12-07 LAB — PROTIME-INR
INR: 1.5 — ABNORMAL HIGH (ref 0.8–1.2)
Prothrombin Time: 17.7 seconds — ABNORMAL HIGH (ref 11.4–15.2)

## 2018-12-07 SURGERY — FIXATION, FRACTURE, INTERTROCHANTERIC, WITH INTRAMEDULLARY ROD
Anesthesia: General | Laterality: Right

## 2018-12-07 MED ORDER — ROCURONIUM BROMIDE 50 MG/5ML IV SOLN
INTRAVENOUS | Status: AC
  Filled 2018-12-07: qty 1

## 2018-12-07 MED ORDER — MORPHINE SULFATE (PF) 2 MG/ML IV SOLN
0.5000 mg | INTRAVENOUS | Status: DC | PRN
  Administered 2018-12-07: 1 mg via INTRAVENOUS
  Filled 2018-12-07: qty 1

## 2018-12-07 MED ORDER — POLYETHYLENE GLYCOL 3350 17 G PO PACK: 17.0000 g | PACK | Freq: Every day | ORAL | Status: DC | PRN

## 2018-12-07 MED ORDER — WARFARIN SODIUM 7.5 MG PO TABS
7.5000 mg | ORAL_TABLET | Freq: Every day | ORAL | Status: DC
  Administered 2018-12-07 – 2018-12-08 (×2): 7.5 mg via ORAL
  Filled 2018-12-07 (×2): qty 1

## 2018-12-07 MED ORDER — ACETAMINOPHEN 500 MG PO TABS
500.0000 mg | ORAL_TABLET | Freq: Four times a day (QID) | ORAL | Status: AC
  Administered 2018-12-08 (×3): 500 mg via ORAL
  Filled 2018-12-07 (×4): qty 1

## 2018-12-07 MED ORDER — KETOROLAC TROMETHAMINE 15 MG/ML IJ SOLN
7.5000 mg | Freq: Four times a day (QID) | INTRAMUSCULAR | Status: AC
  Administered 2018-12-07 – 2018-12-08 (×4): 7.5 mg via INTRAVENOUS
  Filled 2018-12-07 (×4): qty 1

## 2018-12-07 MED ORDER — ASPIRIN EC 81 MG PO TBEC
81.0000 mg | DELAYED_RELEASE_TABLET | Freq: Every day | ORAL | Status: DC
  Administered 2018-12-08 – 2018-12-10 (×3): 81 mg via ORAL
  Filled 2018-12-07 (×4): qty 1

## 2018-12-07 MED ORDER — LIDOCAINE HCL (CARDIAC) PF 100 MG/5ML IV SOSY
PREFILLED_SYRINGE | INTRAVENOUS | Status: DC | PRN
  Administered 2018-12-07: 100 mg via INTRAVENOUS

## 2018-12-07 MED ORDER — DEXAMETHASONE SODIUM PHOSPHATE 10 MG/ML IJ SOLN
INTRAMUSCULAR | Status: DC | PRN
  Administered 2018-12-07: 5 mg via INTRAVENOUS

## 2018-12-07 MED ORDER — PHENYLEPHRINE HCL (PRESSORS) 10 MG/ML IV SOLN
INTRAVENOUS | Status: DC | PRN
  Administered 2018-12-07 (×2): 100 ug via INTRAVENOUS
  Administered 2018-12-07: 150 ug via INTRAVENOUS

## 2018-12-07 MED ORDER — ROCURONIUM BROMIDE 100 MG/10ML IV SOLN
INTRAVENOUS | Status: DC | PRN
  Administered 2018-12-07: 40 mg via INTRAVENOUS
  Administered 2018-12-07: 5 mg via INTRAVENOUS

## 2018-12-07 MED ORDER — HYDROCODONE-ACETAMINOPHEN 7.5-325 MG PO TABS
1.0000 | ORAL_TABLET | ORAL | Status: DC | PRN
  Administered 2018-12-08: 1 via ORAL
  Administered 2018-12-09 (×2): 2 via ORAL
  Filled 2018-12-07: qty 2
  Filled 2018-12-07: qty 1
  Filled 2018-12-07: qty 2

## 2018-12-07 MED ORDER — SUGAMMADEX SODIUM 200 MG/2ML IV SOLN
INTRAVENOUS | Status: AC
  Filled 2018-12-07: qty 2

## 2018-12-07 MED ORDER — METHOCARBAMOL 1000 MG/10ML IJ SOLN
500.0000 mg | Freq: Four times a day (QID) | INTRAVENOUS | Status: DC | PRN
  Filled 2018-12-07: qty 5

## 2018-12-07 MED ORDER — FERROUS SULFATE 325 (65 FE) MG PO TABS
325.0000 mg | ORAL_TABLET | Freq: Three times a day (TID) | ORAL | Status: DC
  Administered 2018-12-08 – 2018-12-10 (×8): 325 mg via ORAL
  Filled 2018-12-07 (×9): qty 1

## 2018-12-07 MED ORDER — POTASSIUM CHLORIDE IN NACL 20-0.9 MEQ/L-% IV SOLN
INTRAVENOUS | Status: DC
  Administered 2018-12-07 – 2018-12-08 (×4): via INTRAVENOUS
  Filled 2018-12-07 (×5): qty 1000

## 2018-12-07 MED ORDER — FENTANYL CITRATE (PF) 100 MCG/2ML IJ SOLN
INTRAMUSCULAR | Status: AC
  Filled 2018-12-07: qty 2

## 2018-12-07 MED ORDER — SUGAMMADEX SODIUM 200 MG/2ML IV SOLN
INTRAVENOUS | Status: DC | PRN
  Administered 2018-12-07: 120 mg via INTRAVENOUS

## 2018-12-07 MED ORDER — WARFARIN - PHARMACIST DOSING INPATIENT
Freq: Every day | Status: DC
  Administered 2018-12-09: 17:00:00

## 2018-12-07 MED ORDER — PHENYLEPHRINE HCL (PRESSORS) 10 MG/ML IV SOLN
INTRAVENOUS | Status: AC
  Filled 2018-12-07: qty 1

## 2018-12-07 MED ORDER — FENTANYL CITRATE (PF) 100 MCG/2ML IJ SOLN
INTRAMUSCULAR | Status: DC | PRN
  Administered 2018-12-07 (×2): 50 ug via INTRAVENOUS

## 2018-12-07 MED ORDER — INSULIN ASPART 100 UNIT/ML ~~LOC~~ SOLN
10.0000 [IU] | Freq: Once | SUBCUTANEOUS | Status: AC
  Administered 2018-12-07: 10 [IU] via SUBCUTANEOUS

## 2018-12-07 MED ORDER — ACETAMINOPHEN 10 MG/ML IV SOLN
INTRAVENOUS | Status: AC
  Filled 2018-12-07: qty 100

## 2018-12-07 MED ORDER — ALUM & MAG HYDROXIDE-SIMETH 200-200-20 MG/5ML PO SUSP: 30.0000 mL | ORAL | Status: DC | PRN

## 2018-12-07 MED ORDER — BISACODYL 10 MG RE SUPP: 10.0000 mg | Freq: Every day | RECTAL | Status: DC | PRN

## 2018-12-07 MED ORDER — ACETAMINOPHEN 325 MG PO TABS
325.0000 mg | ORAL_TABLET | Freq: Four times a day (QID) | ORAL | Status: DC | PRN
  Administered 2018-12-10: 12:00:00 650 mg via ORAL
  Filled 2018-12-07: qty 2

## 2018-12-07 MED ORDER — CEFAZOLIN SODIUM-DEXTROSE 1-4 GM/50ML-% IV SOLN
1.0000 g | Freq: Four times a day (QID) | INTRAVENOUS | Status: AC
  Administered 2018-12-07 – 2018-12-08 (×2): 1 g via INTRAVENOUS
  Filled 2018-12-07 (×3): qty 50

## 2018-12-07 MED ORDER — HYDROCODONE-ACETAMINOPHEN 5-325 MG PO TABS
1.0000 | ORAL_TABLET | ORAL | Status: DC | PRN
  Administered 2018-12-08 – 2018-12-09 (×2): 2 via ORAL
  Filled 2018-12-07 (×2): qty 2

## 2018-12-07 MED ORDER — SUCCINYLCHOLINE CHLORIDE 20 MG/ML IJ SOLN
INTRAMUSCULAR | Status: DC | PRN
  Administered 2018-12-07: 80 mg via INTRAVENOUS

## 2018-12-07 MED ORDER — MAGNESIUM CITRATE PO SOLN
1.0000 | Freq: Once | ORAL | Status: DC | PRN
  Filled 2018-12-07: qty 296

## 2018-12-07 MED ORDER — INSULIN ASPART 100 UNIT/ML ~~LOC~~ SOLN
0.0000 [IU] | Freq: Three times a day (TID) | SUBCUTANEOUS | Status: DC
  Administered 2018-12-07: 09:00:00 5 [IU] via SUBCUTANEOUS
  Administered 2018-12-07: 22:00:00 2 [IU] via SUBCUTANEOUS
  Administered 2018-12-07: 3 [IU] via SUBCUTANEOUS
  Administered 2018-12-08: 5 [IU] via SUBCUTANEOUS
  Administered 2018-12-08: 9 [IU] via SUBCUTANEOUS
  Filled 2018-12-07 (×6): qty 1

## 2018-12-07 MED ORDER — ONDANSETRON HCL 4 MG PO TABS: 4.0000 mg | ORAL_TABLET | Freq: Four times a day (QID) | ORAL | Status: DC | PRN

## 2018-12-07 MED ORDER — PROMETHAZINE HCL 25 MG RE SUPP
25.0000 mg | Freq: Four times a day (QID) | RECTAL | Status: DC | PRN
  Filled 2018-12-07: qty 1

## 2018-12-07 MED ORDER — PHENOL 1.4 % MT LIQD
1.0000 | OROMUCOSAL | Status: DC | PRN
  Filled 2018-12-07: qty 177

## 2018-12-07 MED ORDER — DEXAMETHASONE SODIUM PHOSPHATE 10 MG/ML IJ SOLN
INTRAMUSCULAR | Status: AC
  Filled 2018-12-07: qty 1

## 2018-12-07 MED ORDER — DEXTROSE 50 % IV SOLN
1.0000 | Freq: Once | INTRAVENOUS | Status: AC
  Administered 2018-12-07: 12:00:00 50 mL via INTRAVENOUS
  Filled 2018-12-07: qty 50

## 2018-12-07 MED ORDER — ONDANSETRON HCL 4 MG/2ML IJ SOLN
INTRAMUSCULAR | Status: AC
  Filled 2018-12-07: qty 2

## 2018-12-07 MED ORDER — PROPOFOL 10 MG/ML IV BOLUS
INTRAVENOUS | Status: AC
  Filled 2018-12-07: qty 20

## 2018-12-07 MED ORDER — CEFAZOLIN SODIUM-DEXTROSE 2-4 GM/100ML-% IV SOLN
2.0000 g | INTRAVENOUS | Status: DC
  Filled 2018-12-07: qty 100

## 2018-12-07 MED ORDER — GENTAMICIN SULFATE 40 MG/ML IJ SOLN
INTRAMUSCULAR | Status: AC
  Filled 2018-12-07: qty 2

## 2018-12-07 MED ORDER — PROPOFOL 10 MG/ML IV BOLUS
INTRAVENOUS | Status: DC | PRN
  Administered 2018-12-07: 80 mg via INTRAVENOUS

## 2018-12-07 MED ORDER — METHOCARBAMOL 500 MG PO TABS
500.0000 mg | ORAL_TABLET | Freq: Four times a day (QID) | ORAL | Status: DC | PRN
  Administered 2018-12-08 – 2018-12-09 (×2): 500 mg via ORAL
  Filled 2018-12-07 (×3): qty 1

## 2018-12-07 MED ORDER — ACETAMINOPHEN 10 MG/ML IV SOLN
INTRAVENOUS | Status: DC | PRN
  Administered 2018-12-07: 1000 mg via INTRAVENOUS

## 2018-12-07 MED ORDER — DOCUSATE SODIUM 100 MG PO CAPS
100.0000 mg | ORAL_CAPSULE | Freq: Two times a day (BID) | ORAL | Status: DC
  Administered 2018-12-07 – 2018-12-10 (×6): 100 mg via ORAL
  Filled 2018-12-07 (×6): qty 1

## 2018-12-07 MED ORDER — ONDANSETRON HCL 4 MG/2ML IJ SOLN
4.0000 mg | Freq: Four times a day (QID) | INTRAMUSCULAR | Status: DC | PRN
  Administered 2018-12-07: 18:00:00 4 mg via INTRAVENOUS
  Filled 2018-12-07: qty 2

## 2018-12-07 MED ORDER — SUCCINYLCHOLINE CHLORIDE 20 MG/ML IJ SOLN
INTRAMUSCULAR | Status: AC
  Filled 2018-12-07: qty 1

## 2018-12-07 MED ORDER — MENTHOL 3 MG MT LOZG
1.0000 | LOZENGE | OROMUCOSAL | Status: DC | PRN
  Filled 2018-12-07: qty 9

## 2018-12-07 MED ORDER — LIDOCAINE HCL (PF) 2 % IJ SOLN
INTRAMUSCULAR | Status: AC
  Filled 2018-12-07: qty 10

## 2018-12-07 MED ORDER — FENTANYL CITRATE (PF) 100 MCG/2ML IJ SOLN
25.0000 ug | INTRAMUSCULAR | Status: DC | PRN
  Administered 2018-12-07 (×2): 50 ug via INTRAVENOUS

## 2018-12-07 MED ORDER — ONDANSETRON HCL 4 MG/2ML IJ SOLN
INTRAMUSCULAR | Status: DC | PRN
  Administered 2018-12-07: 4 mg via INTRAVENOUS

## 2018-12-07 SURGICAL SUPPLY — 42 items
BIT DRILL CANN LG 4.3MM (BIT) ×1 IMPLANT
BNDG COHESIVE 6X5 TAN STRL LF (GAUZE/BANDAGES/DRESSINGS) ×6 IMPLANT
CANISTER SUCT 1200ML W/VALVE (MISCELLANEOUS) ×3 IMPLANT
COVER WAND RF STERILE (DRAPES) ×3 IMPLANT
CRADLE LAMINECT ARM (MISCELLANEOUS) ×3 IMPLANT
DRAPE SHEET LG 3/4 BI-LAMINATE (DRAPES) ×6 IMPLANT
DRAPE SURG 17X11 SM STRL (DRAPES) ×6 IMPLANT
DRAPE U-SHAPE 47X51 STRL (DRAPES) ×3 IMPLANT
DRILL BIT CANN LG 4.3MM (BIT) ×3
DRSG OPSITE POSTOP 3X4 (GAUZE/BANDAGES/DRESSINGS) ×9 IMPLANT
DRSG OPSITE POSTOP 4X14 (GAUZE/BANDAGES/DRESSINGS) IMPLANT
DRSG OPSITE POSTOP 4X6 (GAUZE/BANDAGES/DRESSINGS) ×3 IMPLANT
DURAPREP 26ML APPLICATOR (WOUND CARE) ×6 IMPLANT
ELECT REM PT RETURN 9FT ADLT (ELECTROSURGICAL) ×3
ELECTRODE REM PT RTRN 9FT ADLT (ELECTROSURGICAL) ×1 IMPLANT
GAUZE SPONGE 4X4 12PLY STRL (GAUZE/BANDAGES/DRESSINGS) ×3 IMPLANT
GLOVE BIOGEL PI IND STRL 9 (GLOVE) ×1 IMPLANT
GLOVE BIOGEL PI INDICATOR 9 (GLOVE) ×2
GLOVE SURG 9.0 ORTHO LTXF (GLOVE) ×6 IMPLANT
GOWN STRL REUS TWL 2XL XL LVL4 (GOWN DISPOSABLE) ×3 IMPLANT
GOWN STRL REUS W/ TWL LRG LVL3 (GOWN DISPOSABLE) ×1 IMPLANT
GOWN STRL REUS W/TWL LRG LVL3 (GOWN DISPOSABLE) ×2
GUIDEPIN VERSANAIL DSP 3.2X444 (ORTHOPEDIC DISPOSABLE SUPPLIES) ×3 IMPLANT
HEMOVAC 400CC 10FR (MISCELLANEOUS) IMPLANT
KIT TURNOVER CYSTO (KITS) ×3 IMPLANT
MAT ABSORB  FLUID 56X50 GRAY (MISCELLANEOUS) ×2
MAT ABSORB FLUID 56X50 GRAY (MISCELLANEOUS) ×1 IMPLANT
NAIL HIP FRACT 130D 11X180 (Screw) ×3 IMPLANT
NS IRRIG 1000ML POUR BTL (IV SOLUTION) ×3 IMPLANT
PACK HIP COMPR (MISCELLANEOUS) ×3 IMPLANT
PAD ABD DERMACEA PRESS 5X9 (GAUZE/BANDAGES/DRESSINGS) ×6 IMPLANT
SCREW BONE CORTICAL 5.0X3 (Screw) ×3 IMPLANT
SCREW LAG HIP NAIL 10.5X95 (Screw) ×3 IMPLANT
STAPLER SKIN PROX 35W (STAPLE) ×3 IMPLANT
SUCTION FRAZIER HANDLE 10FR (MISCELLANEOUS) ×2
SUCTION TUBE FRAZIER 10FR DISP (MISCELLANEOUS) ×1 IMPLANT
SUT VIC AB 0 CT1 36 (SUTURE) ×6 IMPLANT
SUT VIC AB 2-0 CT1 27 (SUTURE) ×2
SUT VIC AB 2-0 CT1 TAPERPNT 27 (SUTURE) ×1 IMPLANT
SUT VICRYL 0 AB UR-6 (SUTURE) ×3 IMPLANT
SYR 30ML LL (SYRINGE) ×3 IMPLANT
TAPE MICROFOAM 4IN (TAPE) ×3 IMPLANT

## 2018-12-07 NOTE — Progress Notes (Signed)
Patient settles down when not stimulated.

## 2018-12-07 NOTE — Progress Notes (Signed)
Patient restless and irritated, repeating "help me" Over and over anytime we move or touch her.  Reassured patient, lights down low.  Pain medication given for agitation.

## 2018-12-07 NOTE — Transfer of Care (Signed)
Immediate Anesthesia Transfer of Care Note  Patient: Erin Daniels  Procedure(s) Performed: INTRAMEDULLARY (IM) NAIL INTERTROCHANTRIC (Right )  Patient Location: PACU  Anesthesia Type:General  Level of Consciousness: awake and confused  Airway & Oxygen Therapy: Patient Spontanous Breathing and Patient connected to face mask oxygen  Post-op Assessment: Report given to RN and Post -op Vital signs reviewed and stable  Post vital signs: Reviewed and stable  Last Vitals:  Vitals Value Taken Time  BP 158/87 12/07/2018  3:02 PM  Temp 36.9 C 12/07/2018  3:02 PM  Pulse 86 12/07/2018  3:04 PM  Resp 10 12/07/2018  3:04 PM  SpO2 100 % 12/07/2018  3:04 PM  Vitals shown include unvalidated device data.  Last Pain:  Vitals:   12/07/18 1200  TempSrc: Oral  PainSc:       Patients Stated Pain Goal: 1 (92/[id number])  Complications: No apparent anesthesia complications

## 2018-12-07 NOTE — Anesthesia Postprocedure Evaluation (Signed)
Anesthesia Post Note  Patient: Erin Daniels  Procedure(s) Performed: INTRAMEDULLARY (IM) NAIL INTERTROCHANTRIC (Right )  Patient location during evaluation: PACU Anesthesia Type: General Level of consciousness: awake and confused Pain management: pain level controlled Vital Signs Assessment: post-procedure vital signs reviewed and stable Respiratory status: spontaneous breathing, nonlabored ventilation, respiratory function stable and patient connected to nasal cannula oxygen Cardiovascular status: blood pressure returned to baseline and stable Postop Assessment: no apparent nausea or vomiting Anesthetic complications: no     Last Vitals:  Vitals:   12/07/18 1532 12/07/18 1545  BP: (!) 149/70   Pulse: 94 (!) 105  Resp: 20 (!) 25  Temp:    SpO2: 95% 96%    Last Pain:  Vitals:   12/07/18 1545  TempSrc:   PainSc: 6                  Precious Haws Piscitello

## 2018-12-07 NOTE — Progress Notes (Signed)
Consent order shows complete in Epic but unable to obtain consent due to patient Dementia/AMS. Pt doesn't understand she is having surgery or why she is in hospital. PDowless, RN 12/07/2018

## 2018-12-07 NOTE — Progress Notes (Signed)
Patient lying on her right side on arrival to PACU.  Patient gets irritated and agitated with any movement or touch. Attempted to reassure patient. Right foot pink in color, warm to touch.  Pedal pulse present good quality. +2.

## 2018-12-07 NOTE — Progress Notes (Signed)
Subjective:  Hospital day #2 for right intertrochanteric hip fracture.  Patient remains confused.  She did not remember that she fell or broke her right hip.  Patient reports right hip pain as mild to moderate at rest.  Objective:   VITALS:   Vitals:   12/06/18 2001 12/07/18 0100 12/07/18 0406 12/07/18 0817  BP:   (!) 161/63 (!) 104/93  Pulse:   62 94  Resp:   16 16  Temp:   99.4 F (37.4 C) 99.1 F (37.3 C)  TempSrc:   Axillary   SpO2:  94% 100% 96%  Weight: 61.7 kg       PHYSICAL EXAM: Right lower extremity: Patient has shortening and external rotation of the right lower extremity. Neurovascular intact Sensation intact distally Intact pulses distally Dorsiflexion/Plantar flexion intact Compartment soft  LABS  Results for orders placed or performed during the hospital encounter of 12/06/18 (from the past 24 hour(s))  CBC with Differential     Status: Abnormal   Collection Time: 12/06/18  3:46 PM  Result Value Ref Range   WBC 6.7 4.0 - 10.5 K/uL   RBC 3.77 (L) 3.87 - 5.11 MIL/uL   Hemoglobin 11.5 (L) 12.0 - 15.0 g/dL   HCT 34.6 (L) 36.0 - 46.0 %   MCV 91.8 80.0 - 100.0 fL   MCH 30.5 26.0 - 34.0 pg   MCHC 33.2 30.0 - 36.0 g/dL   RDW 12.5 11.5 - 15.5 %   Platelets 191 150 - 400 K/uL   nRBC 0.0 0.0 - 0.2 %   Neutrophils Relative % 63 %   Neutro Abs 4.2 1.7 - 7.7 K/uL   Lymphocytes Relative 23 %   Lymphs Abs 1.6 0.7 - 4.0 K/uL   Monocytes Relative 10 %   Monocytes Absolute 0.6 0.1 - 1.0 K/uL   Eosinophils Relative 4 %   Eosinophils Absolute 0.3 0.0 - 0.5 K/uL   Basophils Relative 0 %   Basophils Absolute 0.0 0.0 - 0.1 K/uL   Immature Granulocytes 0 %   Abs Immature Granulocytes 0.02 0.00 - 0.07 K/uL  Basic metabolic panel     Status: None   Collection Time: 12/06/18  3:46 PM  Result Value Ref Range   Sodium 140 135 - 145 mmol/L   Potassium 3.7 3.5 - 5.1 mmol/L   Chloride 102 98 - 111 mmol/L   CO2 30 22 - 32 mmol/L   Glucose, Bld 80 70 - 99 mg/dL   BUN 22  8 - 23 mg/dL   Creatinine, Ser 0.53 0.44 - 1.00 mg/dL   Calcium 9.6 8.9 - 10.3 mg/dL   GFR calc non Af Amer >60 >60 mL/min   GFR calc Af Amer >60 >60 mL/min   Anion gap 8 5 - 15  Protime-INR     Status: Abnormal   Collection Time: 12/06/18  3:46 PM  Result Value Ref Range   Prothrombin Time 22.1 (H) 11.4 - 15.2 seconds   INR 2.0 (H) 0.8 - 1.2  Type and screen If not already done in ED     Status: None   Collection Time: 12/06/18  3:54 PM  Result Value Ref Range   ABO/RH(D) O POS    Antibody Screen NEG    Sample Expiration      12/09/2018 Performed at [facility], [address] [place], [place] [address]   Glucose, capillary     Status: None   Collection Time: 12/06/18  6:04 PM  Result Value Ref Range  Glucose-Capillary 72 70 - 99 mg/dL  Ethanol     Status: None   Collection Time: 12/06/18  8:31 PM  Result Value Ref Range   Alcohol, Ethyl (B) <10 <10 mg/dL  TSH     Status: None   Collection Time: 12/06/18  8:31 PM  Result Value Ref Range   TSH 1.506 0.350 - 4.500 uIU/mL  Ammonia     Status: Abnormal   Collection Time: 12/06/18  8:31 PM  Result Value Ref Range   Ammonia <9 (L) 9 - 35 umol/L  Glucose, capillary     Status: Abnormal   Collection Time: 12/06/18  9:25 PM  Result Value Ref Range   Glucose-Capillary 266 (H) 70 - 99 mg/dL  Urine Drug Screen, Qualitative ([facility] only)     Status: Abnormal   Collection Time: 12/06/18 10:42 PM  Result Value Ref Range   Tricyclic, Ur Screen NONE DETECTED NONE DETECTED   Amphetamines, Ur Screen NONE DETECTED NONE DETECTED   MDMA (Ecstasy)Ur Screen NONE DETECTED NONE DETECTED   Cocaine Metabolite,Ur [place] NONE DETECTED NONE DETECTED   Opiate, Ur Screen NONE DETECTED NONE DETECTED   Phencyclidine (PCP) Ur S NONE DETECTED NONE DETECTED   Cannabinoid 50 Ng, Ur [place] NONE DETECTED NONE DETECTED   Barbiturates, Ur Screen POSITIVE (A) NONE DETECTED   Benzodiazepine, Ur Scrn POSITIVE (A) NONE DETECTED   Methadone Scn, Ur NONE  DETECTED NONE DETECTED  Glucose, capillary     Status: Abnormal   Collection Time: 12/07/18 12:56 AM  Result Value Ref Range   Glucose-Capillary 353 (H) 70 - 99 mg/dL  Basic metabolic panel     Status: Abnormal   Collection Time: 12/07/18  5:29 AM  Result Value Ref Range   Sodium 135 135 - 145 mmol/L   Potassium 4.6 3.5 - 5.1 mmol/L   Chloride 99 98 - 111 mmol/L   CO2 23 22 - 32 mmol/L   Glucose, Bld 402 (H) 70 - 99 mg/dL   BUN 22 8 - 23 mg/dL   Creatinine, Ser 0.58 0.44 - 1.00 mg/dL   Calcium 9.0 8.9 - 10.3 mg/dL   GFR calc non Af Amer >60 >60 mL/min   GFR calc Af Amer >60 >60 mL/min   Anion gap 13 5 - 15  CBC     Status: Abnormal   Collection Time: 12/07/18  5:29 AM  Result Value Ref Range   WBC 9.5 4.0 - 10.5 K/uL   RBC 3.81 (L) 3.87 - 5.11 MIL/uL   Hemoglobin 11.6 (L) 12.0 - 15.0 g/dL   HCT 35.6 (L) 36.0 - 46.0 %   MCV 93.4 80.0 - 100.0 fL   MCH 30.4 26.0 - 34.0 pg   MCHC 32.6 30.0 - 36.0 g/dL   RDW 12.2 11.5 - 15.5 %   Platelets 173 150 - 400 K/uL   nRBC 0.0 0.0 - 0.2 %  Protime-INR     Status: Abnormal   Collection Time: 12/07/18  5:29 AM  Result Value Ref Range   Prothrombin Time 17.7 (H) 11.4 - 15.2 seconds   INR 1.5 (H) 0.8 - 1.2  Glucose, capillary     Status: Abnormal   Collection Time: 12/07/18  6:25 AM  Result Value Ref Range   Glucose-Capillary 391 (H) 70 - 99 mg/dL  Glucose, capillary     Status: Abnormal   Collection Time: 12/07/18  8:01 AM  Result Value Ref Range   Glucose-Capillary 286 (H) 70 - 99 mg/dL  Glucose, capillary  Status: None   Collection Time: 12/07/18 11:35 AM  Result Value Ref Range   Glucose-Capillary 70 70 - 99 mg/dL    Dg Chest 1 View  Result Date: 12/06/2018 CLINICAL DATA:  Right hip fracture, fall. EXAM: CHEST  1 VIEW COMPARISON:  Radiographs of November 11, 2017. FINDINGS: The heart size and mediastinal contours are within normal limits. Both lungs are clear. Atherosclerosis of thoracic aorta is noted. No pneumothorax or  pleural effusion is noted. Status post left shoulder arthroplasty. IMPRESSION: No active disease. Aortic Atherosclerosis (ICD10-I70.0). Electronically Signed   By: Marijo Conception M.D.   On: 12/06/2018 16:36   Dg Shoulder Right  Result Date: 12/06/2018 CLINICAL DATA:  Right arm pain EXAM: RIGHT SHOULDER - 2+ VIEW COMPARISON:  None. FINDINGS: No acute fracture or dislocation is noted. No soft tissue changes are seen. IMPRESSION: No acute abnormality noted. Electronically Signed   By: Inez Catalina M.D.   On: 12/06/2018 23:01   Dg Elbow 2 Views Right  Result Date: 12/06/2018 CLINICAL DATA:  Right arm pain EXAM: RIGHT ELBOW - 2 VIEW COMPARISON:  None. FINDINGS: No acute fracture or dislocation is noted. No joint effusion is seen. No other focal abnormality is noted. IMPRESSION: No acute abnormality seen. Electronically Signed   By: Inez Catalina M.D.   On: 12/06/2018 23:02   Dg Wrist 2 Views Right  Result Date: 12/06/2018 CLINICAL DATA:  Right arm pain EXAM: RIGHT WRIST - 2 VIEW COMPARISON:  05/25/2015 FINDINGS: Fixation sideplate is noted along the distal right radius with multiple fixation screws. Mild degenerative changes of the radiocarpal joint are seen. No acute fracture or dislocation is noted. Vascular calcifications are noted. IMPRESSION: Status post ORIF of distal right radial fracture. No acute bony abnormality is seen. Electronically Signed   By: Inez Catalina M.D.   On: 12/06/2018 23:02   Ct Head Wo Contrast  Result Date: 12/06/2018 CLINICAL DATA:  Fall down stairs. EXAM: CT HEAD WITHOUT CONTRAST CT CERVICAL SPINE WITHOUT CONTRAST TECHNIQUE: Multidetector CT imaging of the head and cervical spine was performed following the standard protocol without intravenous contrast. Multiplanar CT image reconstructions of the cervical spine were also generated. COMPARISON:  CT of the head on 05/28/2018 FINDINGS: CT HEAD FINDINGS Brain: Stable advanced small vessel ischemic disease of the periventricular  white matter and multiple old small vessel infarcts, particularly in the region of the left basal ganglia and internal capsule. The brain demonstrates no evidence of hemorrhage, acute infarction, edema, mass effect, extra-axial fluid collection, hydrocephalus or mass lesion. Vascular: No hyperdense vessel or unexpected calcification. Skull: Normal. Negative for fracture or focal lesion. Sinuses/Orbits: No acute finding. Other: None. CT CERVICAL SPINE FINDINGS Alignment: Mild anterolisthesis C3 on C4 of approximately 3 mm. Skull base and vertebrae: No acute fracture identified. Soft tissues and spinal canal: No prevertebral soft tissue swelling or incidental masses. Disc levels: Moderate degenerative disc disease most prominently at C4-5 and C5-6. Milder degenerative disc disease at C3-4 and C6-7. Upper chest: Negative. Other: Calcified plaque present at both carotid bifurcations. IMPRESSION: 1. 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 No evidence of acute brain injury, hemorrhage or skull fracture. 2. No evidence of acute cervical injury. Moderate degenerative disc disease present at C4-5 and C5-6 with milder disc disease at C3-4 and C6-7. Associated mild anterolisthesis of C3 on C4 of approximately 3 mm. Electronically Signed   By: Aletta Edouard M.D.   On: 12/06/2018 16:35   Ct Cervical  Spine Wo Contrast  Result Date: 12/06/2018 CLINICAL DATA:  Fall down stairs. EXAM: CT HEAD WITHOUT CONTRAST CT CERVICAL SPINE WITHOUT CONTRAST TECHNIQUE: Multidetector CT imaging of the head and cervical spine was performed following the standard protocol without intravenous contrast. Multiplanar CT image reconstructions of the cervical spine were also generated. COMPARISON:  CT of the head on 05/28/2018 FINDINGS: CT HEAD FINDINGS Brain: Stable advanced small vessel ischemic disease of the periventricular white matter and multiple old small vessel infarcts, particularly in the region of the  left basal ganglia and internal capsule. The brain demonstrates no evidence of hemorrhage, acute infarction, edema, mass effect, extra-axial fluid collection, hydrocephalus or mass lesion. Vascular: No hyperdense vessel or unexpected calcification. Skull: Normal. Negative for fracture or focal lesion. Sinuses/Orbits: No acute finding. Other: None. CT CERVICAL SPINE FINDINGS Alignment: Mild anterolisthesis C3 on C4 of approximately 3 mm. Skull base and vertebrae: No acute fracture identified. Soft tissues and spinal canal: No prevertebral soft tissue swelling or incidental masses. Disc levels: Moderate degenerative disc disease most prominently at C4-5 and C5-6. Milder degenerative disc disease at C3-4 and C6-7. Upper chest: Negative. Other: Calcified plaque present at both carotid bifurcations. IMPRESSION: 1. 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 No evidence of acute brain injury, hemorrhage or skull fracture. 2. No evidence of acute cervical injury. Moderate degenerative disc disease present at C4-5 and C5-6 with milder disc disease at C3-4 and C6-7. Associated mild anterolisthesis of C3 on C4 of approximately 3 mm. Electronically Signed   By: Aletta Edouard M.D.   On: 12/06/2018 16:35   Dg Hip Unilat W Or Wo Pelvis 2-3 Views Right  Result Date: 12/06/2018 CLINICAL DATA:  Right hip pain after fall. EXAM: DG HIP (WITH OR WITHOUT PELVIS) 2-3V RIGHT COMPARISON:  None. FINDINGS: Mildly displaced fracture is seen involving the intertrochanteric region of the proximal right femur. Vascular calcifications are noted. Left hip is unremarkable. IMPRESSION: Mildly displaced intertrochanteric fracture of the proximal right femur. Electronically Signed   By: Marijo Conception M.D.   On: 12/06/2018 16:34    Assessment/Plan: Day of Surgery   Active Problems:   * No active hospital problems. *   Patient has been n.p.o.  She has been cleared for surgery by the hospitalist service.   Patient to have intramedullary fixation for right intertrochanteric hip fracture today.   Thornton Park , MD 12/07/2018, 11:53 AM

## 2018-12-07 NOTE — Progress Notes (Signed)
Notified Dr. Jannifer Franklin of pt bs [phone].

## 2018-12-07 NOTE — TOC Initial Note (Signed)
Transition of Care [facility]) - Initial/Assessment Note    Patient Details  Name: Erin Daniels MRN: [id number] Date of Birth: 1946-01-12  Transition of Care [facility]) CM/SW Contact:    Latanya Maudlin, RN Phone Number: 12/07/2018, 9:49 AM  Clinical Narrative:   [facility] team consulted to complete assessment as patient is high risk admission. Spoke primarily with spouse Erin Daniels as patient is confused. Patient lives with her spouse and per Erin Daniels has had increased weakness and he fears she will not do well going back home. He says that she has been to rehab recently at [facility] and did very well. He fears if she goes home with home health she will refuse. She uses a rolling walker and bedside commode at home. [name] is agreeable to whatever PT recommends. Patient is planned to go to the OR this weekend for hip surgery. PCP is [facility]. Uses [facility] without issue.                 Expected Discharge Plan: Skilled Nursing Facility Barriers to Discharge: Continued Medical Work up   Patient Goals and CMS Choice   CMS Medicare.gov Compare Post Acute Care list provided to:: Patient Represenative (must comment)(spouse)    Expected Discharge Plan and Services Expected Discharge Plan: [place] arrangements for the past 2 months: Single Family Home                          Prior Living Arrangements/Services Living arrangements for the past 2 months: Single Family Home Lives with:: Spouse Patient language and need for interpreter reviewed:: No Do you feel safe going back to the place where you live?: Yes      Need for Family Participation in Patient Care: No (Comment)   Current home services: DME Criminal Activity/Legal Involvement Pertinent to Current Situation/Hospitalization: No - Comment as needed  Activities of Daily Living Home Assistive Devices/Equipment: None ADL Screening (condition at time of admission) Patient's cognitive ability adequate to  safely complete daily activities?: No Is the patient deaf or have difficulty hearing?: No Does the patient have difficulty seeing, even when wearing glasses/contacts?: No Does the patient have difficulty concentrating, remembering, or making decisions?: Yes Patient able to express need for assistance with ADLs?: Yes Does the patient have difficulty dressing or bathing?: (unable to determine) Independently performs ADLs?: (unable to determine at this time) Does the patient have difficulty walking or climbing stairs?: Yes Weakness of Legs: Right Weakness of Arms/Hands: None  Permission Sought/Granted Permission sought to share information with : Case Manager                Emotional Assessment Appearance:: Developmentally appropriate Attitude/Demeanor/Rapport: Ambitious, Gracious Affect (typically observed): Accepting Orientation: : Oriented to Self      Admission diagnosis:  Fall, initial encounter [W19.XXXA] Closed fracture of right hip, initial encounter [facility]) [S72.001A] Patient Active Problem List   Diagnosis Date Noted  . Hypercoagulopathy ([place]) 09/29/2018  . Acute anxiety 09/29/2018  . Hypertension associated with type 1 diabetes mellitus ([place]) 09/19/2018  . Dyslipidemia due to type 1 diabetes mellitus ([place]) 09/19/2018  . Anemia of chronic disease 09/19/2018  . Chronic migraine without aura 09/19/2018  . Chronic non-seasonal allergic rhinitis 09/19/2018  . Major depression, recurrent, chronic ([place]) 09/19/2018  . Diabetic gastroparesis associated with type 1 diabetes mellitus ([place]) 09/19/2018  . Bilateral sacral insufficiency fracture 09/19/2018  . Constipation 09/15/2018  . Acute urinary retention  09/15/2018  . Intractable pain 09/15/2018  . Chronic cerebrovascular accident (CVA) 05/28/2018  . Diabetic polyneuropathy associated with type 1 diabetes mellitus ([place]) 12/10/2017  . Numbness and tingling of both legs 12/10/2017  . Chronic pain syndrome 12/10/2017  . DKA  (diabetic ketoacidoses) ([place]) 11/12/2017  . Confusion 11/11/2017  . Hypoglycemia 11/26/2016  . Lactic acid acidosis   . Sepsis ([place])   . 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 Essential hypertension   . Depression   . Right radial fracture 05/29/2015   PCP:  Juluis Pitch, MD Pharmacy:   [facility], [place] [address] Phone: [phone] Fax: [phone]  [facility], [place] [address] [place] [address] Phone: [phone] Fax: [id number] [place], [place] - [place] AT [place] [address] [place] [address] Phone: [phone] Fax: [phone] [place], [place] [address] Phone: [phone] Fax: [phone]     Social Determinants of Health (SDOH) Interventions    Readmission Risk Interventions Readmission Risk Prevention Plan 12/07/2018  Transportation Screening Complete  HRI or Home Care Consult Complete  Social Work Consult for [place] Planning/Counseling Complete  Medication Review Press photographer) Complete  Some recent data might be hidden

## 2018-12-07 NOTE — Op Note (Signed)
DATE OF SURGERY:  12/07/2018  TIME: 3:30 PM  PATIENT NAME:  Erin Daniels  AGE: 73 y.o.  PRE-OPERATIVE DIAGNOSIS:  Right intertrochanteric hip fracture  POST-OPERATIVE DIAGNOSIS:  SAME  PROCEDURE:  INTRAMEDULLARY FIXATION FOR INTERTROCHANTERIC HIP FRACTURE  SURGEON:  Thornton Park, MD  OPERATIVE IMPLANTS: Biomet short Affixus nail 11 x 180 mm, 95 mm lag screw with a 34 mm distal interlocking screw  PREOPERATIVE INDICATIONS:  Erin Daniels is a 73 y.o. year old who fell and suffered a hip fracture. She was brought into the ER and then admitted and medically cleared for surgical intervention.    The risks, benefits and alternatives were discussed with the patient and their family.  The risks include but are not limited to infection, bleeding, nerve or blood vessel injury, malunion, nonunion, hardware prominence, hardware failure, change in leg lengths or lower extremity rotation need for further surgery including hardware removal with conversion to a total hip arthroplasty. Medical risks include but are not limited to DVT and pulmonary embolism, myocardial infarction, stroke, pneumonia, respiratory failure and death. The patient and their family understood these risks and wished to proceed with surgery.  OPERATIVE PROCEDURE:  The patient was brought to the operating room and placed in the supine position on the fracture table. General anesthesia was administered due to the patient being on coumadin with an INR of 1.5.  A time out was performed to verify the patient's name, date of birth, medical record number, correct site of surgery correct procedure to be performed. The timeout was also used to verify the patient received antibiotics and all appropriate instruments, implants and radiographic studies were available in the room. Once all in attendance were in agreement, the case began.  A closed reduction was performed under C-arm guidance.  The fracture reduction was confirmed on both  AP and lateral views. The patient was prepped and draped in a sterile fashion. She received preoperative antibiotics with 2 grams of ancef.  An incision was made proximal to the greater trochanter in line with the femur. A guidewire was placed over the tip of the greater trochanter and advanced into the proximal femur to the level of the lesser trochanter.  Confirmation of the drill pin position was made on AP and lateral C-arm images.  The threaded guidepin was then overdrilled with the proximal femoral drill.  The nail was then inserted into the proximal femur, across the fracture site and into the femoral shaft. Its position was confirmed on AP and lateral C-arm images.   Once the nail was completely seated, the drill guide for the lag screw was placed through the guide arm for the Affixus nail. A guidepin was then placed through this drill guide and advanced through the lateral cortex of the femur, across the fracture site and into the femoral head achieving a tip apex distance of less than 25 mm. The depth of the drill pin was measured to be 65mm, and then the drill for the lag screw was advanced through the lateral cortex, across the fracture site and up into the femoral head to the depth of the lag screw.  The lag screw was then advanced by hand into position across the fracture site into the femoral head. Its final position was confirmed on AP and lateral C-arm images. Compression was applied as traction was carefully released. The set screw in the top of the intramedullary rod was tightened by hand using a screwdriver. It was backed off a quarter turn to  allow for compression at the fracture site.  The drill sleeve for the distal interlocking screw was then placed through the Affixus guide arm. A small stab incision was made to allow the drill guide to approximate the lateral cortex of the femur. The drill for the distal interlocking screw was then advanced bicortically. The depth of this drill was  measured to be 34 mm. A distal interlocking screw with the length measured was then inserted by hand through the guide arm. Final C-arm images of the entire intramedullary construct were taken in both the AP and lateral planes.   The wounds were irrigated copiously and closed with 0 Vicryl for closure of the deep fascia and 2-0 Vicryl for subcutaneous closure. The skin was approximated with staples. A dry sterile dressing was applied. I was scrubbed and present the entire case and all sharp and instrument counts were correct at the conclusion of the case. Patient was transferred to hospital bed and brought to PACU in stable condition. I spoke with the patient's husband by phone from the PACU postop to let him know the case had been performed without complication and the patient was stable in the recovery room.  Family members are restricted from coming to the hospital at this time due to COVID-19.    Timoteo Gaul, MD

## 2018-12-07 NOTE — Progress Notes (Signed)
15 minute call to floor. 

## 2018-12-07 NOTE — Progress Notes (Signed)
ANTICOAGULATION CONSULT NOTE - Initial Consult  Pharmacy Consult for  Warfarin  Indication: stroke prophylaxis  Allergies  Allergen Reactions  . Codeine Nausea Only and Other (See Comments)    Reaction: stress attacks/ headache Can take Vicodin and Percocet   . Meperidine Nausea Only and Other (See Comments)    Reaction: stress attacks/ headache  . Metoclopramide Hcl Nausea Only and Other (See Comments)    Reaction: stress attacks/ headache  . Oxycodone Nausea Only and Other (See Comments)    Reaction: stress attacks/ headache  . Stadol [Butorphanol] Nausea Only and Other (See Comments)    Reaction: stress attacks/ headache  . Tussionex Pennkinetic Er [Hydrocod Polst-Cpm Polst Er] Other (See Comments)    Filled patient with sugar   . Carbocaine  [Mepivacaine Hcl] Palpitations    Patient Measurements: Weight: 136 lb (61.7 kg) Heparin Dosing Weight:   Vital Signs: Temp: 98.4 F (36.9 C) (04/18 1502) Temp Source: Oral (04/18 1200) BP: 149/70 (04/18 1532) Pulse Rate: 105 (04/18 1545)  Labs: Recent Labs    12/06/18 1546 12/07/18 0529  HGB 11.5* 11.6*  HCT 34.6* 35.6*  PLT 191 173  LABPROT 22.1* 17.7*  INR 2.0* 1.5*  CREATININE 0.53 0.58    Estimated Creatinine Clearance: 53.2 mL/min (by C-G formula based on SCr of 0.58 mg/dL).   Medical History: Past Medical History:  Diagnosis Date  . At high risk for falls   . Basal cell carcinoma    right upper leg  . Complication of anesthesia    difficult to arouse  . Diabetes mellitus without complication (HCC)    Type I  . Diabetic neuropathy ([place])   . Diabetic retinopathy ([place])   . Dizziness   . Gastroparesis   . Herniated disc, cervical   . Hypercholesteremia   . Hypertension   . Hypotensive episode   . Hypothyroidism   . Insulin pump in place   . Myofascial muscle pain   . Numbness of arm    left  . Osteoporosis   . Poor vision   . Seizures ([place])   . Slurred speech   . 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 Stroke (HCC)    x5  . Vertigo   . Wears glasses     Medications:  Medications Prior to Admission  Medication Sig Dispense Refill Last Dose  . aspirin EC 81 MG tablet Take 81 mg by mouth daily.   12/06/2018 at 0800  . atorvastatin (LIPITOR) 40 MG tablet Take 1 tablet (40 mg total) by mouth daily. 30 tablet 0 12/05/2018 at 1800  . brimonidine-timolol (COMBIGAN) 0.2-0.5 % ophthalmic solution Place 1 drop into both eyes 3 (three) times daily. Instill one drop in right eye morning, noon and bedtime   12/06/2018 at 1300  . Calcium Carb-Cholecalciferol (CALCIUM 600 + D) 600-200 MG-UNIT TABS Take 1 tablet by mouth every evening.   12/06/2018 at 0800  . Cholecalciferol (VITAMIN D-3) 5000 units TABS Take 5,000 Units by mouth daily.   12/06/2018 at 0800  . ferrous sulfate 325 (65 FE) MG tablet Take 325 mg by mouth daily with breakfast.   12/06/2018 at 0800  . gabapentin (NEURONTIN) 300 MG capsule 300 mg in the morning, 300 mg in the afternoon, 900 mg in the evening 150 capsule 0 12/06/2018 at 1200  . Garlic 5784 MG CAPS Take 1 capsule by mouth daily.    12/06/2018 at 0800  . losartan (COZAAR) 50 MG tablet Take 1 tablet (50 mg  total) by mouth daily for 30 days. 30 tablet 0 12/06/2018 at 0800  . Multiple Vitamins-Minerals (CEROVITE ADVANCED FORMULA PO) Take 1 tablet by mouth daily.   12/06/2018 at 0800  . Netarsudil-Latanoprost (ROCKLATAN) 0.02-0.005 % SOLN Apply 1 drop to eye at bedtime. Instill one drop in right eye at bedtime   12/05/2018 at 2000  . promethazine (PHENERGAN) 25 MG suppository Place 25 mg rectally every 6 (six) hours as needed for nausea or vomiting.   prn at prn  . venlafaxine (EFFEXOR) 75 MG tablet Take 1 tablet (75 mg total) by mouth 2 (two) times daily. 60 tablet 0 12/06/2018 at 0800  . vitamin C (ASCORBIC ACID) 500 MG tablet Take 500 mg by mouth 2 (two) times daily.   12/06/2018 at 0800  . warfarin (COUMADIN) 5 MG tablet Take 1 tablet (5 mg total) by mouth See admin instructions. Managed by  Dr. Merian Capron, Mon, Wed, Fri, Sat (Patient taking differently: Take 7.5 mg by mouth See admin instructions. Take one and one-half tablets (7.5 mg) by mouth daily. Managed by Dr. Ouida Sills) 10 tablet 0 12/05/2018 at 1800  . acetaminophen (TYLENOL) 325 MG tablet Take 2 tablets (650 mg total) by mouth every 6 (six) hours as needed for headache. For mild pain 30 tablet 0 prn at prn  . ACETAMINOPHEN-BUTALBITAL 50-325 MG TABS Take 1 tablet by mouth 2 (two) times daily as needed (headache). (Patient not taking: Reported on 12/06/2018) 180 each 1 Not Taking at prn  . butalbital-acetaminophen-caffeine (FIORICET) 50-325-40 MG tablet Take 1 tablet by mouth 2 (two) times daily.   prn at prn  . carboxymethylcellulose (REFRESH PLUS) 0.5 % SOLN Place 1 drop into both eyes as needed (for dry eyes). 15 mL 0 prn at prn  . cetirizine (ZYRTEC) 10 MG tablet Take 1 tablet (10 mg total) by mouth daily. 30 tablet 0 prn at prn  . glucose 4 GM chewable tablet Chew 1 tablet (4 g total) by mouth as needed for low blood sugar. <65 50 tablet 0 prn at prn  . insulin aspart (NOVOLOG FLEXPEN) 100 UNIT/ML FlexPen Inject 0-15 Units into the skin 3 (three) times daily with meals. Inject 0-15 units subcutaneous per sliding scale before meals and at bedtime 0 - 100 units = 0 units 101 - 120 = 1 unit 121 - 150 = 2 units 151 - 200 = 4 units 201 - 250 = 6 units 251 - 300 = 8 units 301 - 350 = 10 units 351 - 400 = 12 units 401 - 450 = 15 units > 450 call MD 15 mL 0   . Insulin Glargine (LANTUS SOLOSTAR) 100 UNIT/ML Solostar Pen Inject 12 Units into the skin at bedtime. (Patient not taking: Reported on 12/06/2018) 15 mL 0 Not Taking at Unknown time  . linaclotide (LINZESS) 72 MCG capsule Take 1 capsule (72 mcg total) by mouth daily for 30 days. 30 capsule 0   . NON FORMULARY Diet type:  NCS   Taking  . polyethylene glycol (MIRALAX / GLYCOLAX) packet Take 17 g by mouth daily.    prn at prn  . senna (SENOKOT) 8.6 MG TABS tablet Take 2  tablets by mouth 2 (two) times daily.   prn at prn  . warfarin (COUMADIN) 6 MG tablet Take 1 tablet (6 mg total) by mouth daily. Once A Day on Tue, Thu (Patient not taking: Reported on 12/06/2018) 4 tablet 0 Not Taking at Unknown time    Assessment: Pharmacy consulted to  dose warfarin for stroke prophylaxis in this 73 year old female admitted for surgical repair of right radial fracture.   4/17:  INR = 2.0 4/18:  INR = 1.5   Pt was on warfarin 7.5 mg PO daily at home.  Goal of Therapy:  INR 2-3  Plan:  Will resume pt's home dose of warfarin 7.5 mg PO daily on 4/18 and recheck INR on 4/19 with AM labs.   Nusaybah Ivie D 12/07/2018,3:58 PM

## 2018-12-07 NOTE — Progress Notes (Signed)
[facility] NAME: Erin Daniels    MR#:  [id number]  DATE OF BIRTH:  February 24, 1946  SUBJECTIVE:   Here after a mechanical fall noted to have a right hip fracture.  Plan for surgery later today.  Patient is demented and nonverbal at baseline.   REVIEW OF SYSTEMS:    Review of Systems  Unable to perform ROS: Dementia    Nutrition: NPO Tolerating Diet: yes Tolerating PT: Await Eval post-op  DRUG ALLERGIES:   Allergies  Allergen Reactions  . Codeine Nausea Only and Other (See Comments)    Reaction: stress attacks/ headache Can take Vicodin and Percocet   . Meperidine Nausea Only and Other (See Comments)    Reaction: stress attacks/ headache  . Metoclopramide Hcl Nausea Only and Other (See Comments)    Reaction: stress attacks/ headache  . Oxycodone Nausea Only and Other (See Comments)    Reaction: stress attacks/ headache  . Stadol [Butorphanol] Nausea Only and Other (See Comments)    Reaction: stress attacks/ headache  . Tussionex Pennkinetic Er [Hydrocod Polst-Cpm Polst Er] Other (See Comments)    Filled patient with sugar   . Carbocaine  [Mepivacaine Hcl] Palpitations    VITALS:  Blood pressure (!) 126/56, pulse 84, temperature 98.3 F (36.8 C), temperature source Oral, resp. rate 17, weight 61.7 kg, SpO2 97 %.  PHYSICAL EXAMINATION:   Physical Exam  GENERAL:  73 y.o.-year-old patient lying in bed in NAD.  EYES: Pupils equal, round, reactive to light and accommodation. No scleral icterus. Extraocular muscles intact.  HEENT: Head atraumatic, normocephalic. Oropharynx and nasopharynx clear.  NECK:  Supple, no jugular venous distention. No thyroid enlargement, no tenderness.  LUNGS: Normal breath sounds bilaterally, no wheezing, rales, rhonchi. No use of accessory muscles of respiration.  CARDIOVASCULAR: S1, S2 normal. No murmurs, rubs, or gallops.  ABDOMEN: Soft, nontender, nondistended. Bowel sounds present. No  organomegaly or mass.  EXTREMITIES: No cyanosis, clubbing or edema b/l.  RLE shortened and ext. Rotated due to fracture.   NEUROLOGIC: Cranial nerves II through XII are intact. No focal Motor or sensory deficits b/l. Globally weak.   PSYCHIATRIC: The patient is alert and oriented x 1.  SKIN: No obvious rash, lesion, or ulcer.    LABORATORY PANEL:   CBC Recent Labs  Lab 12/07/18 0529  WBC 9.5  HGB 11.6*  HCT 35.6*  PLT 173   ------------------------------------------------------------------------------------------------------------------  Chemistries  Recent Labs  Lab 12/07/18 0529  NA 135  K 4.6  CL 99  CO2 23  GLUCOSE 402*  BUN 22  CREATININE 0.58  CALCIUM 9.0   ------------------------------------------------------------------------------------------------------------------  Cardiac Enzymes No results for input(s): TROPONINI in the last 168 hours. ------------------------------------------------------------------------------------------------------------------  RADIOLOGY:  Dg Chest 1 View  Result Date: 12/06/2018 CLINICAL DATA:  Right hip fracture, fall. EXAM: CHEST  1 VIEW COMPARISON:  Radiographs of November 11, 2017. FINDINGS: The heart size and mediastinal contours are within normal limits. Both lungs are clear. Atherosclerosis of thoracic aorta is noted. No pneumothorax or pleural effusion is noted. Status post left shoulder arthroplasty. IMPRESSION: No active disease. Aortic Atherosclerosis (ICD10-I70.0). Electronically Signed   By: Marijo Conception M.D.   On: 12/06/2018 16:36   Dg Shoulder Right  Result Date: 12/06/2018 CLINICAL DATA:  Right arm pain EXAM: RIGHT SHOULDER - 2+ VIEW COMPARISON:  None. FINDINGS: No acute fracture or dislocation is noted. No soft tissue changes are seen. IMPRESSION: No acute abnormality noted. Electronically Signed  By: Inez Catalina M.D.   On: 12/06/2018 23:01   Dg Elbow 2 Views Right  Result Date: 12/06/2018 CLINICAL DATA:  Right  arm pain EXAM: RIGHT ELBOW - 2 VIEW COMPARISON:  None. FINDINGS: No acute fracture or dislocation is noted. No joint effusion is seen. No other focal abnormality is noted. IMPRESSION: No acute abnormality seen. Electronically Signed   By: Inez Catalina M.D.   On: 12/06/2018 23:02   Dg Wrist 2 Views Right  Result Date: 12/06/2018 CLINICAL DATA:  Right arm pain EXAM: RIGHT WRIST - 2 VIEW COMPARISON:  05/25/2015 FINDINGS: Fixation sideplate is noted along the distal right radius with multiple fixation screws. Mild degenerative changes of the radiocarpal joint are seen. No acute fracture or dislocation is noted. Vascular calcifications are noted. IMPRESSION: Status post ORIF of distal right radial fracture. No acute bony abnormality is seen. Electronically Signed   By: Inez Catalina M.D.   On: 12/06/2018 23:02   Ct Head Wo Contrast  Result Date: 12/06/2018 CLINICAL DATA:  Fall down stairs. EXAM: CT HEAD WITHOUT CONTRAST CT CERVICAL SPINE WITHOUT CONTRAST TECHNIQUE: Multidetector CT imaging of the head and cervical spine was performed following the standard protocol without intravenous contrast. Multiplanar CT image reconstructions of the cervical spine were also generated. COMPARISON:  CT of the head on 05/28/2018 FINDINGS: CT HEAD FINDINGS Brain: Stable advanced small vessel ischemic disease of the periventricular white matter and multiple old small vessel infarcts, particularly in the region of the left basal ganglia and internal capsule. The brain demonstrates no evidence of hemorrhage, acute infarction, edema, mass effect, extra-axial fluid collection, hydrocephalus or mass lesion. Vascular: No hyperdense vessel or unexpected calcification. Skull: Normal. Negative for fracture or focal lesion. Sinuses/Orbits: No acute finding. Other: None. CT CERVICAL SPINE FINDINGS Alignment: Mild anterolisthesis C3 on C4 of approximately 3 mm. Skull base and vertebrae: No acute fracture identified. Soft tissues and spinal  canal: No prevertebral soft tissue swelling or incidental masses. Disc levels: Moderate degenerative disc disease most prominently at C4-5 and C5-6. Milder degenerative disc disease at C3-4 and C6-7. Upper chest: Negative. Other: Calcified plaque present at both carotid bifurcations. IMPRESSION: 1. 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 No evidence of acute brain injury, hemorrhage or skull fracture. 2. No evidence of acute cervical injury. Moderate degenerative disc disease present at C4-5 and C5-6 with milder disc disease at C3-4 and C6-7. Associated mild anterolisthesis of C3 on C4 of approximately 3 mm. Electronically Signed   By: Aletta Edouard M.D.   On: 12/06/2018 16:35   Ct Cervical Spine Wo Contrast  Result Date: 12/06/2018 CLINICAL DATA:  Fall down stairs. EXAM: CT HEAD WITHOUT CONTRAST CT CERVICAL SPINE WITHOUT CONTRAST TECHNIQUE: Multidetector CT imaging of the head and cervical spine was performed following the standard protocol without intravenous contrast. Multiplanar CT image reconstructions of the cervical spine were also generated. COMPARISON:  CT of the head on 05/28/2018 FINDINGS: CT HEAD FINDINGS Brain: Stable advanced small vessel ischemic disease of the periventricular white matter and multiple old small vessel infarcts, particularly in the region of the left basal ganglia and internal capsule. The brain demonstrates no evidence of hemorrhage, acute infarction, edema, mass effect, extra-axial fluid collection, hydrocephalus or mass lesion. Vascular: No hyperdense vessel or unexpected calcification. Skull: Normal. Negative for fracture or focal lesion. Sinuses/Orbits: No acute finding. Other: None. CT CERVICAL SPINE FINDINGS Alignment: Mild anterolisthesis C3 on C4 of approximately 3 mm. Skull base and vertebrae: No  acute fracture identified. Soft tissues and spinal canal: No prevertebral soft tissue swelling or incidental masses. Disc levels: Moderate  degenerative disc disease most prominently at C4-5 and C5-6. Milder degenerative disc disease at C3-4 and C6-7. Upper chest: Negative. Other: Calcified plaque present at both carotid bifurcations. IMPRESSION: 1. 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 No evidence of acute brain injury, hemorrhage or skull fracture. 2. No evidence of acute cervical injury. Moderate degenerative disc disease present at C4-5 and C5-6 with milder disc disease at C3-4 and C6-7. Associated mild anterolisthesis of C3 on C4 of approximately 3 mm. Electronically Signed   By: Aletta Edouard M.D.   On: 12/06/2018 16:35   Dg Hip Unilat W Or Wo Pelvis 2-3 Views Right  Result Date: 12/06/2018 CLINICAL DATA:  Right hip pain after fall. EXAM: DG HIP (WITH OR WITHOUT PELVIS) 2-3V RIGHT COMPARISON:  None. FINDINGS: Mildly displaced fracture is seen involving the intertrochanteric region of the proximal right femur. Vascular calcifications are noted. Left hip is unremarkable. IMPRESSION: Mildly displaced intertrochanteric fracture of the proximal right femur. Electronically Signed   By: Marijo Conception M.D.   On: 12/06/2018 16:34     ASSESSMENT AND PLAN:   73 year old female with past medical history of diabetes, dementia, history of previous CVA, seizures, hypertension, hyperlipidemia, history of gastroparesis, diabetic neuropathy who presents to the hospital after a mechanical fall noted to have right hip fracture.  1.  Status post fall and right hip fracture- patient has been cleared from a medical perspective for surgery. -Plan for right hip intramedullary pinning as per orthopedics today. -Continue pain control and postoperative care as per orthopedics.  2.  Diabetes type 2 without complication-continue sliding scale insulin.  3.  Essential hypertension-continue losartan hydralazine as needed.  4.  Glaucoma-continue latanoprost-Timoptic/brimonidine.  Eyedrops.  5.   Hyperlipidemia-continue atorvastatin.  6.  Depression-continue Effexor.    All the records are reviewed and case discussed with Care Management/Social Worker. Management plans discussed with the patient, family and they are in agreement.  CODE STATUS: Full code  DVT Prophylaxis: coumadin  TOTAL TIME TAKING CARE OF THIS PATIENT: 30 minutes.   POSSIBLE D/C IN 2-3 DAYS, DEPENDING ON CLINICAL CONDITION.   Henreitta Leber M.D on 12/07/2018 at 1:47 PM  Between 7am to 6pm - Pager - [phone]  After 6pm go to [email or web address] - Proofreader  [facility]  Office  [phone]  CC: Primary care physician; Juluis Pitch, MD

## 2018-12-07 NOTE — Anesthesia Post-op Follow-up Note (Signed)
Anesthesia QCDR form completed.        

## 2018-12-07 NOTE — Anesthesia Procedure Notes (Signed)
Procedure Name: Intubation Date/Time: 12/07/2018 1:41 PM Performed by: Dionne Bucy, CRNA Pre-anesthesia Checklist: Patient identified, Patient being monitored, Timeout performed, Emergency Drugs available and Suction available Patient Re-evaluated:Patient Re-evaluated prior to induction Oxygen Delivery Method: Circle system utilized Preoxygenation: Pre-oxygenation with 100% oxygen Induction Type: IV induction and Rapid sequence Ventilation: Mask ventilation without difficulty Laryngoscope Size: 3 and McGraph Grade View: Grade I Tube type: Oral Tube size: 7.0 mm Number of attempts: 1 Airway Equipment and Method: Stylet and Video-laryngoscopy Placement Confirmation: ETT inserted through vocal cords under direct vision,  positive ETCO2 and breath sounds checked- equal and bilateral Secured at: 21 cm Tube secured with: Tape Dental Injury: Teeth and Oropharynx as per pre-operative assessment

## 2018-12-07 NOTE — Progress Notes (Signed)
Capillary glucose was 70mg /dL at 1149. Pt was asymptomatic.  Attending informed. New orders received. Pt received 1amp dextrose 50% IV. Capillary glucose was 196mg /dL at 1227.

## 2018-12-08 DIAGNOSIS — S72141A Displaced intertrochanteric fracture of right femur, initial encounter for closed fracture: Secondary | ICD-10-CM | POA: Diagnosis present

## 2018-12-08 LAB — GLUCOSE, CAPILLARY
Glucose-Capillary: 365 mg/dL — ABNORMAL HIGH (ref 70–99)
Glucose-Capillary: 266 mg/dL — ABNORMAL HIGH (ref 70–99)
Glucose-Capillary: 313 mg/dL — ABNORMAL HIGH (ref 70–99)
Glucose-Capillary: 234 mg/dL — ABNORMAL HIGH (ref 70–99)

## 2018-12-08 LAB — BASIC METABOLIC PANEL
Anion gap: 14 (ref 5–15)
BUN: 26 mg/dL — ABNORMAL HIGH (ref 8–23)
CO2: 18 mmol/L — ABNORMAL LOW (ref 22–32)
Calcium: 8.3 mg/dL — ABNORMAL LOW (ref 8.9–10.3)
Chloride: 105 mmol/L (ref 98–111)
Creatinine, Ser: 0.66 mg/dL (ref 0.44–1.00)
GFR calc Af Amer: >60 mL/min (ref >60)
GFR calc non Af Amer: >60 mL/min (ref >60)
Glucose, Bld: 394 mg/dL — ABNORMAL HIGH (ref 70–99)
Potassium: 5.1 mmol/L (ref 3.5–5.1)
Sodium: 137 mmol/L (ref 135–145)

## 2018-12-08 LAB — CBC
HCT: 27 % — ABNORMAL LOW (ref 36.0–46.0)
Hemoglobin: 8.8 g/dL — ABNORMAL LOW (ref 12.0–15.0)
MCH: 30.9 pg (ref 26.0–34.0)
MCHC: 32.6 g/dL (ref 30.0–36.0)
MCV: 94.7 fL (ref 80.0–100.0)
Platelets: 159 10*3/uL (ref 150–400)
RBC: 2.85 MIL/uL — ABNORMAL LOW (ref 3.87–5.11)
RDW: 12.6 % (ref 11.5–15.5)
WBC: 8.8 10*3/uL (ref 4.0–10.5)
nRBC: 0 % (ref 0.0–0.2)

## 2018-12-08 LAB — PROTIME-INR
INR: 1.1 (ref 0.8–1.2)
Prothrombin Time: 14.5 seconds (ref 11.4–15.2)

## 2018-12-08 MED ORDER — INSULIN ASPART 100 UNIT/ML ~~LOC~~ SOLN
0.0000 [IU] | Freq: Every day | SUBCUTANEOUS | Status: DC
  Administered 2018-12-08: 23:00:00 2 [IU] via SUBCUTANEOUS
  Filled 2018-12-08: qty 1

## 2018-12-08 MED ORDER — INSULIN ASPART 100 UNIT/ML ~~LOC~~ SOLN
0.0000 [IU] | Freq: Three times a day (TID) | SUBCUTANEOUS | Status: DC
  Administered 2018-12-08: 11 [IU] via SUBCUTANEOUS
  Administered 2018-12-09: 08:00:00 15 [IU] via SUBCUTANEOUS
  Filled 2018-12-08 (×2): qty 1

## 2018-12-08 NOTE — Evaluation (Addendum)
Physical Therapy Evaluation Patient Details Name: Erin Daniels MRN: [id number] DOB: [birth year] Today's Date: 12/08/2018   History of Present Illness  Pt is a 73 yo female patient with history of osteoporosis, herniated disks in the cervical area, hypertension, hypothyroidism, type 1 diabetes mellitus on insulin pump, CVA in the past currently under hospitalist service for lower lumbar sacral pain.  Assessment includes: R hip fracture with internal fixation.  Clinical Impression  Patient resting in bed with alarm on receiving 2L O2 via [place] upon PT arrival. Patient was lethargic and confused but was amenable to therapy. Patient grimaces with any touch even to non-operative side and requires constant reorientation to place, context, and task. Patient was able to follow one-step commands inconsistently and frequently yells "help me, help me" immediately after agreeing to participate in a task. Patient required Max Assist to transfer from supine<>sit and Max/Mod Assist to maintain sitting EOB with all limbs supported. Patient able to participate in some cross body reaching in sitting, but requires Max/Mod Assist to maintain balance while performing. Patient attempted a sit to stand with RW, Max Assist, and elevated surface, but was unable due to confusion midway through task. This occurred on multiple trials and the patient ultimately resisted any further activity, but did have greater participation in transferring from sit to supine at the end of the session. Due to patient's cognitive baseline, she was unable to provide insight in to her discharge intentions and home equipment available, nor could she provide any information regarding her home set-up. Information noted in this evaluation regarding the aforementioned were provided via nursing or based on her previous evaluation performed 08/2018.   Based on patient's current level of confusion and limited mobility level, she is most appropriate for  discharge to a SNF. Patient will benefit from continued skilled therapeutic intervention to address deficits in strength, balance, mobility, and safety in order to decrease risk of falls.    Patient left supine in bed with feet elevated, B SCDs applied, and on the phone with her family member. Bed alarm was activated at PT departure.   Follow Up Recommendations SNF    Equipment Recommendations  Other (comment)(Equipment TBD at next venue)    Recommendations for Other Services       Precautions / Restrictions Precautions Precautions: Fall Restrictions Weight Bearing Restrictions: Yes RLE Weight Bearing: Weight bearing as tolerated Other Position/Activity Restrictions: RLE      Mobility  Bed Mobility Overal bed mobility: Needs Assistance Bed Mobility: Supine to Sit;Sit to Supine     Supine to sit: Max assist Sit to supine: Max assist   General bed mobility comments: Patient's current assistance level may be reliant on her state of confusion and pain experience. Patient was able to help minimally in moments of lucidity and cooperation.   Transfers Overall transfer level: Needs assistance Equipment used: Rolling walker (2 wheeled) Transfers: Sit to/from Stand Sit to Stand: Total assist;From elevated surface         General transfer comment: Patient unable to stand completely and although starting cooperatively began resisting the task midway. Patient's state of confusion and pain experience likely large contributing factors to level of assistance.  Ambulation/Gait             General Gait Details: Unable to assess at this time due to patient's cognition and pain status.  Stairs            Wheelchair Mobility    Modified Rankin (Stroke Patients Only)  Balance Overall balance assessment: History of Falls;Needs assistance Sitting-balance support: Bilateral upper extremity supported;Feet supported Sitting balance-Leahy Scale: Poor Sitting balance -  Comments: Patient requires max VCs and TCs to maintain sitting balance and can handle no challenge even with support. Postural control: Posterior lean;Left lateral lean                                   Pertinent Vitals/Pain Pain Assessment: Faces Faces Pain Scale: Hurts little more Pain Location: R hip Pain Intervention(s): Limited activity within patient's tolerance;Premedicated before session;Monitored during session;Repositioned    Home Living Family/patient expects to be discharged to:: Private residence Living Arrangements: Spouse/significant other Available Help at Discharge: Family;Available 24 hours/day Type of Home: House Home Access: Stairs to enter Entrance Stairs-Rails: Can reach both;Left;Right Entrance Stairs-Number of Steps: 3 Home Layout: One level        Prior Function Level of Independence: Independent(Patient not a reliable historian.)         Comments: Patient unable to provide clear information due to state of confusion and distress.     Hand Dominance   Dominant Hand: Right    Extremity/Trunk Assessment   Upper Extremity Assessment Upper Extremity Assessment: Generalized weakness    Lower Extremity Assessment Lower Extremity Assessment: Generalized weakness       Communication   Communication: No difficulties  Cognition Arousal/Alertness: Lethargic Behavior During Therapy: Agitated;Anxious Overall Cognitive Status: History of cognitive impairments - at baseline                                 General Comments: Patient vacillated between confused and distressed to cooperative. Patient not consistently able to follow one step commands and needs reorientation to task, place, and context frequently.      General Comments      Exercises Other Exercises Other Exercises: Bed mobility: supine to sit/sit to supine Max Assist with constant reorientation to task and VCs for cooperation. Other Exercises: Sitting  EOB, BUE BLE supported, Max/Mod Assist to maintain balance and reorient to task. Other Exercises: Sitting EOB with reaching cross body: x3 each side. Max/Mod Assist to prevent LOB. Heavy posterior and left lateral leans.   Assessment/Plan    PT Assessment Patient needs continued PT services  PT Problem List Decreased strength;Decreased mobility;Decreased safety awareness;Decreased knowledge of precautions;Decreased range of motion;Decreased activity tolerance;Decreased cognition;Pain;Decreased balance       PT Treatment Interventions DME instruction;Functional mobility training;Balance training;Patient/family education;Gait training;Therapeutic activities;Neuromuscular re-education;Therapeutic exercise;Stair training    PT Goals (Current goals can be found in the Care Plan section)  Acute Rehab PT Goals PT Goal Formulation: Patient unable to participate in goal setting    Frequency BID   Barriers to discharge Other (comment) Patient's current functional level and need for skilled assistance may be beyond what her spouse is able to provide at home.    Co-evaluation               AM-PAC PT "6 Clicks" Mobility  Outcome Measure Help needed turning from your back to your side while in a flat bed without using bedrails?: Total Help needed moving from lying on your back to sitting on the side of a flat bed without using bedrails?: Total Help needed moving to and from a bed to a chair (including a wheelchair)?: Total Help needed standing up from a chair using your arms (  e.g., wheelchair or bedside chair)?: Total Help needed to walk in hospital room?: Total Help needed climbing 3-5 steps with a railing? : Total 6 Click Score: 6    End of Session Equipment Utilized During Treatment: Gait belt;Oxygen Activity Tolerance: Patient limited by pain;Treatment limited secondary to agitation Patient left: in bed;with bed alarm set;with call bell/phone within reach Nurse Communication:  Mobility status PT Visit Diagnosis: Unsteadiness on feet (R26.81);History of falling (Z91.81);Muscle weakness (generalized) (M62.81);Difficulty in walking, not elsewhere classified (R26.2)    Time: 9826-4158 PT Time Calculation (min) (ACUTE ONLY): 40 min   Charges:   PT Evaluation $PT Eval Moderate Complexity: 1 Mod PT Treatments $Therapeutic Activity: 8-22 mins       Myles Gip PT, DPT [phone]   12/08/2018, 11:31 AM

## 2018-12-08 NOTE — NC FL2 (Signed)
[place] LEVEL OF CARE SCREENING TOOL     IDENTIFICATION  Patient Name: Erin Daniels Birthdate: 01/09/1946 Sex: female Admission Date (Current Location): 12/06/2018  [place] and [place] Number:  Engineering geologist and Address:  [facility], [address], [place], [place] [address]      Provider Number: [id number]  Attending Physician Name and Address:  Henreitta Leber, MD  Relative Name and Phone Number:  Mykal Kirchman [phone]    Current Level of Care: Hospital Recommended Level of Care: [place] Prior Approval Number:    Date Approved/Denied:   PASRR Number: [id number]  Discharge Plan: SNF    Current Diagnoses: Patient Active Problem List   Diagnosis Date Noted  . Intertrochanteric fracture of right hip ([place]) 12/08/2018  . Hypercoagulopathy ([place]) 09/29/2018  . Acute anxiety 09/29/2018  . Hypertension associated with type 1 diabetes mellitus ([place]) 09/19/2018  . Dyslipidemia due to type 1 diabetes mellitus ([place]) 09/19/2018  . Anemia of chronic disease 09/19/2018  . Chronic migraine without aura 09/19/2018  . Chronic non-seasonal allergic rhinitis 09/19/2018  . Major depression, recurrent, chronic ([place]) 09/19/2018  . Diabetic gastroparesis associated with type 1 diabetes mellitus ([place]) 09/19/2018  . Bilateral sacral insufficiency fracture 09/19/2018  . Constipation 09/15/2018  . Acute urinary retention 09/15/2018  . Intractable pain 09/15/2018  . Chronic cerebrovascular accident (CVA) 05/28/2018  . Diabetic polyneuropathy associated with type 1 diabetes mellitus ([place]) 12/10/2017  . Numbness and tingling of both legs 12/10/2017  . Chronic pain syndrome 12/10/2017  . DKA (diabetic ketoacidoses) ([place]) 11/12/2017  . Confusion 11/11/2017  . Hypoglycemia 11/26/2016  . Lactic acid acidosis   . Sepsis ([place])   . 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  Essential hypertension   . Depression   . Right radial fracture 05/29/2015    Orientation RESPIRATION BLADDER Height & Weight     Self  Normal Continent Weight: 61.7 kg Height:     BEHAVIORAL SYMPTOMS/MOOD NEUROLOGICAL BOWEL NUTRITION STATUS      Continent Diet  AMBULATORY STATUS COMMUNICATION OF NEEDS Skin   Extensive Assist Verbally Surgical wounds                       Personal Care Assistance Level of Assistance  Bathing, Feeding, Dressing, Total care Bathing Assistance: Maximum assistance Feeding assistance: Independent Dressing Assistance: Limited assistance Total Care Assistance: Limited assistance   Functional Limitations Info  Sight, Hearing Sight Info: Adequate Hearing Info: Adequate Speech Info: Adequate    SPECIAL CARE FACTORS FREQUENCY  OT (By licensed OT), PT (By licensed PT)     PT Frequency: 5x per week OT Frequency: 5x per week            Contractures Contractures Info: Not present    Additional Factors Info                  Current Medications (12/08/2018):  This is the current hospital active medication list Current Facility-Administered Medications  Medication Dose Route Frequency Provider Last Rate Last Dose  . 0.9 % NaCl with KCl 20 mEq/ L  infusion   Intravenous Continuous Thornton Park, MD 75 mL/hr at 12/08/18 0601    . acetaminophen (TYLENOL) tablet 325-650 mg  325-650 mg Oral Q6H PRN Thornton Park, MD      . acetaminophen (TYLENOL) tablet 500 mg  500 mg Oral Q6H Thornton Park, MD   500  mg at 12/08/18 1205  . alum & mag hydroxide-simeth (MAALOX/MYLANTA) 200-200-20 MG/5ML suspension 30 mL  30 mL Oral Q4H PRN Thornton Park, MD      . aspirin EC tablet 81 mg  81 mg Oral Daily Thornton Park, MD   81 mg at 12/08/18 0831  . atorvastatin (LIPITOR) tablet 40 mg  40 mg Oral Daily Thornton Park, MD   40 mg at 12/08/18 0831  . bisacodyl (DULCOLAX) suppository 10 mg  10 mg Rectal Daily PRN Thornton Park, MD      .  timolol (TIMOPTIC) 0.5 % ophthalmic solution 1 drop  1 drop Right Eye BID Thornton Park, MD   1 drop at 12/08/18 0833   And  . brimonidine (ALPHAGAN) 0.2 % ophthalmic solution 1 drop  1 drop Right Eye BID Thornton Park, MD   1 drop at 12/08/18 0834  . docusate sodium (COLACE) capsule 100 mg  100 mg Oral BID Thornton Park, MD   100 mg at 12/08/18 0831  . ferrous sulfate tablet 325 mg  325 mg Oral TID Lorre Munroe, MD   325 mg at 12/08/18 1222  . hydrALAZINE (APRESOLINE) injection 5 mg  5 mg Intravenous Q4H PRN Thornton Park, MD      . HYDROcodone-acetaminophen [facility]) 7.5-325 MG per tablet 1-2 tablet  1-2 tablet Oral Q4H PRN Thornton Park, MD   1 tablet at 12/08/18 [phone]  . HYDROcodone-acetaminophen (NORCO/VICODIN) 5-325 MG per tablet 1-2 tablet  1-2 tablet Oral Q4H PRN Thornton Park, MD      . insulin aspart (novoLOG) injection 0-15 Units  0-15 Units Subcutaneous TID WC Sainani, Vivek J, MD      . insulin aspart (novoLOG) injection 0-5 Units  0-5 Units Subcutaneous QHS Henreitta Leber, MD      . loratadine (CLARITIN) tablet 10 mg  10 mg Oral Daily Thornton Park, MD   10 mg at 12/08/18 0240  . losartan (COZAAR) tablet 50 mg  50 mg Oral Daily Thornton Park, MD   50 mg at 12/08/18 9735  . magnesium citrate solution 1 Bottle  1 Bottle Oral Once PRN Thornton Park, MD      . menthol-cetylpyridinium (CEPACOL) lozenge 3 mg  1 lozenge Oral PRN Thornton Park, MD       Or  . phenol (CHLORASEPTIC) mouth spray 1 spray  1 spray Mouth/Throat PRN Thornton Park, MD      . methocarbamol (ROBAXIN) tablet 500 mg  500 mg Oral Q6H PRN Thornton Park, MD   500 mg at 12/08/18 0835   Or  . methocarbamol (ROBAXIN) 500 mg in dextrose 5 % 50 mL IVPB  500 mg Intravenous Q6H PRN Thornton Park, MD      . morphine 2 MG/ML injection 0.5-1 mg  0.5-1 mg Intravenous Q2H PRN Thornton Park, MD   1 mg at 12/07/18 2147  . Netarsudil-Latanoprost 0.02-0.005 % SOLN 1 drop  1 drop Right Eye  QHS Thornton Park, MD      . ondansetron [facility]) tablet 4 mg  4 mg Oral Q6H PRN Thornton Park, MD       Or  . ondansetron [facility]) injection 4 mg  4 mg Intravenous Q6H PRN Thornton Park, MD   4 mg at 12/07/18 1815  . polyethylene glycol (MIRALAX / GLYCOLAX) packet 17 g  17 g Oral Daily PRN Thornton Park, MD      . polyvinyl alcohol (LIQUIFILM TEARS) 1.4 % ophthalmic solution 1 drop  1 drop Both Eyes PRN Thornton Park,  MD   1 drop at 12/07/18 2149  . promethazine (PHENERGAN) suppository 25 mg  25 mg Rectal Q6H PRN Thornton Park, MD      . venlafaxine [facility]) tablet 75 mg  75 mg Oral BID Thornton Park, MD   75 mg at 12/08/18 2023  . warfarin (COUMADIN) tablet 7.5 mg  7.5 mg Oral q1800 Orene Desanctis, RPH   7.5 mg at 12/07/18 1816  . Warfarin - Pharmacist Dosing Inpatient   Does not apply q1800 Orene Desanctis, [facility]         Discharge Medications: Please see discharge summary for a list of discharge medications.  Relevant Imaging Results:  Relevant Lab Results:   Additional Information SSN [id number]  Latanya Maudlin, RN

## 2018-12-08 NOTE — Progress Notes (Signed)
[place] [facility] [place] NAME: Israa Caban    MR#:  [id number]  DATE OF BIRTH:  02-27-1946  SUBJECTIVE:   Here after a mechanical fall noted to have a right hip fracture.  S/p hip intramedullary fixation postop day #1 today.  Remains confused.  REVIEW OF SYSTEMS:    Review of Systems  Unable to perform ROS: Dementia    Nutrition: Regular Tolerating Diet: yes Tolerating PT: eval noted.   DRUG ALLERGIES:   Allergies  Allergen Reactions  . Codeine Nausea Only and Other (See Comments)    Reaction: stress attacks/ headache Can take Vicodin and Percocet   . Meperidine Nausea Only and Other (See Comments)    Reaction: stress attacks/ headache  . Metoclopramide Hcl Nausea Only and Other (See Comments)    Reaction: stress attacks/ headache  . Oxycodone Nausea Only and Other (See Comments)    Reaction: stress attacks/ headache  . Stadol [Butorphanol] Nausea Only and Other (See Comments)    Reaction: stress attacks/ headache  . Tussionex Pennkinetic Er [Hydrocod Polst-Cpm Polst Er] Other (See Comments)    Filled patient with sugar   . Carbocaine  [Mepivacaine Hcl] Palpitations    VITALS:  Blood pressure (!) 136/57, pulse 100, temperature 98.6 F (37 C), temperature source Oral, resp. rate 16, weight 61.7 kg, SpO2 100 %.  PHYSICAL EXAMINATION:   Physical Exam  GENERAL:  73 y.o.-year-old patient lying in bed in NAD.  EYES: Pupils equal, round, reactive to light and accommodation. No scleral icterus. Extraocular muscles intact.  HEENT: Head atraumatic, normocephalic. Oropharynx and nasopharynx clear.  NECK:  Supple, no jugular venous distention. No thyroid enlargement, no tenderness.  LUNGS: Normal breath sounds bilaterally, no wheezing, rales, rhonchi. No use of accessory muscles of respiration.  CARDIOVASCULAR: S1, S2 normal. No murmurs, rubs, or gallops.  ABDOMEN: Soft, nontender, nondistended. Bowel sounds present. No  organomegaly or mass.  EXTREMITIES: No cyanosis, clubbing or edema b/l.  Right hip dressing in place from surgery yesterday NEUROLOGIC: Cranial nerves II through XII are intact. No focal Motor or sensory deficits b/l. Globally weak.   PSYCHIATRIC: The patient is alert and oriented x 1.  SKIN: No obvious rash, lesion, or ulcer.    LABORATORY PANEL:   CBC Recent Labs  Lab 12/08/18 0607  WBC 8.8  HGB 8.8*  HCT 27.0*  PLT 159   ------------------------------------------------------------------------------------------------------------------  Chemistries  Recent Labs  Lab 12/08/18 0607  NA 137  K 5.1  CL 105  CO2 18*  GLUCOSE 394*  BUN 26*  CREATININE 0.66  CALCIUM 8.3*   ------------------------------------------------------------------------------------------------------------------  Cardiac Enzymes No results for input(s): TROPONINI in the last 168 hours. ------------------------------------------------------------------------------------------------------------------  RADIOLOGY:  Dg Chest 1 View  Result Date: 12/06/2018 CLINICAL DATA:  Right hip fracture, fall. EXAM: CHEST  1 VIEW COMPARISON:  Radiographs of November 11, 2017. FINDINGS: The heart size and mediastinal contours are within normal limits. Both lungs are clear. Atherosclerosis of thoracic aorta is noted. No pneumothorax or pleural effusion is noted. Status post left shoulder arthroplasty. IMPRESSION: No active disease. Aortic Atherosclerosis (ICD10-I70.0). Electronically Signed   By: Marijo Conception M.D.   On: 12/06/2018 16:36   Dg Shoulder Right  Result Date: 12/06/2018 CLINICAL DATA:  Right arm pain EXAM: RIGHT SHOULDER - 2+ VIEW COMPARISON:  None. FINDINGS: No acute fracture or dislocation is noted. No soft tissue changes are seen. IMPRESSION: No acute abnormality noted. Electronically Signed   By: Elta Guadeloupe  Lukens M.D.   On: 12/06/2018 23:01   Dg Elbow 2 Views Right  Result Date: 12/06/2018 CLINICAL DATA:   Right arm pain EXAM: RIGHT ELBOW - 2 VIEW COMPARISON:  None. FINDINGS: No acute fracture or dislocation is noted. No joint effusion is seen. No other focal abnormality is noted. IMPRESSION: No acute abnormality seen. Electronically Signed   By: Inez Catalina M.D.   On: 12/06/2018 23:02   Dg Wrist 2 Views Right  Result Date: 12/06/2018 CLINICAL DATA:  Right arm pain EXAM: RIGHT WRIST - 2 VIEW COMPARISON:  05/25/2015 FINDINGS: Fixation sideplate is noted along the distal right radius with multiple fixation screws. Mild degenerative changes of the radiocarpal joint are seen. No acute fracture or dislocation is noted. Vascular calcifications are noted. IMPRESSION: Status post ORIF of distal right radial fracture. No acute bony abnormality is seen. Electronically Signed   By: Inez Catalina M.D.   On: 12/06/2018 23:02   Ct Head Wo Contrast  Result Date: 12/06/2018 CLINICAL DATA:  Fall down stairs. EXAM: CT HEAD WITHOUT CONTRAST CT CERVICAL SPINE WITHOUT CONTRAST TECHNIQUE: Multidetector CT imaging of the head and cervical spine was performed following the standard protocol without intravenous contrast. Multiplanar CT image reconstructions of the cervical spine were also generated. COMPARISON:  CT of the head on 05/28/2018 FINDINGS: CT HEAD FINDINGS Brain: Stable advanced small vessel ischemic disease of the periventricular white matter and multiple old small vessel infarcts, particularly in the region of the left basal ganglia and internal capsule. The brain demonstrates no evidence of hemorrhage, acute infarction, edema, mass effect, extra-axial fluid collection, hydrocephalus or mass lesion. Vascular: No hyperdense vessel or unexpected calcification. Skull: Normal. Negative for fracture or focal lesion. Sinuses/Orbits: No acute finding. Other: None. CT CERVICAL SPINE FINDINGS Alignment: Mild anterolisthesis C3 on C4 of approximately 3 mm. Skull base and vertebrae: No acute fracture identified. Soft tissues and  spinal canal: No prevertebral soft tissue swelling or incidental masses. Disc levels: Moderate degenerative disc disease most prominently at C4-5 and C5-6. Milder degenerative disc disease at C3-4 and C6-7. Upper chest: Negative. Other: Calcified plaque present at both carotid bifurcations. IMPRESSION: 1. 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 No evidence of acute brain injury, hemorrhage or skull fracture. 2. No evidence of acute cervical injury. Moderate degenerative disc disease present at C4-5 and C5-6 with milder disc disease at C3-4 and C6-7. Associated mild anterolisthesis of C3 on C4 of approximately 3 mm. Electronically Signed   By: Aletta Edouard M.D.   On: 12/06/2018 16:35   Ct Cervical Spine Wo Contrast  Result Date: 12/06/2018 CLINICAL DATA:  Fall down stairs. EXAM: CT HEAD WITHOUT CONTRAST CT CERVICAL SPINE WITHOUT CONTRAST TECHNIQUE: Multidetector CT imaging of the head and cervical spine was performed following the standard protocol without intravenous contrast. Multiplanar CT image reconstructions of the cervical spine were also generated. COMPARISON:  CT of the head on 05/28/2018 FINDINGS: CT HEAD FINDINGS Brain: Stable advanced small vessel ischemic disease of the periventricular white matter and multiple old small vessel infarcts, particularly in the region of the left basal ganglia and internal capsule. The brain demonstrates no evidence of hemorrhage, acute infarction, edema, mass effect, extra-axial fluid collection, hydrocephalus or mass lesion. Vascular: No hyperdense vessel or unexpected calcification. Skull: Normal. Negative for fracture or focal lesion. Sinuses/Orbits: No acute finding. Other: None. CT CERVICAL SPINE FINDINGS Alignment: Mild anterolisthesis C3 on C4 of approximately 3 mm. Skull base and vertebrae: No acute fracture identified.  Soft tissues and spinal canal: No prevertebral soft tissue swelling or incidental masses. Disc levels:  Moderate degenerative disc disease most prominently at C4-5 and C5-6. Milder degenerative disc disease at C3-4 and C6-7. Upper chest: Negative. Other: Calcified plaque present at both carotid bifurcations. IMPRESSION: 1. 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 No evidence of acute brain injury, hemorrhage or skull fracture. 2. No evidence of acute cervical injury. Moderate degenerative disc disease present at C4-5 and C5-6 with milder disc disease at C3-4 and C6-7. Associated mild anterolisthesis of C3 on C4 of approximately 3 mm. Electronically Signed   By: Aletta Edouard M.D.   On: 12/06/2018 16:35   Dg Hip Port Unilat With Pelvis 1v Right  Result Date: 12/07/2018 CLINICAL DATA:  Status post right hip replacement. EXAM: DG HIP (WITH OR WITHOUT PELVIS) 1V PORT RIGHT COMPARISON:  None. FINDINGS: A gamma nail and intramedullary rod cross the intertrochanteric fracture of the right hip. A distal interlocking screw is noted. Hardware is in good position. Skin staples and postoperative soft tissue air noted. IMPRESSION: Right hip repair as above.  Hardware is in good position. Electronically Signed   By: Dorise Bullion III M.D   On: 12/07/2018 16:19   Dg Hip Operative Unilat W Or W/o Pelvis Right  Result Date: 12/07/2018 CLINICAL DATA:  Right hip fracture repair. FLUOROSCOPY TIME:  1 minutes 29 seconds. Images: 6 EXAM: OPERATIVE RIGHT HIP (WITH PELVIS IF PERFORMED) 6 VIEWS TECHNIQUE: Fluoroscopic spot image(s) were submitted for interpretation post-operatively. COMPARISON:  None. FINDINGS: By the end of the study, a gamma nail and intramedullary rod have been placed across the right hip fracture with a distal interlocking screw. IMPRESSION: Right hip fracture repair as above. Electronically Signed   By: Dorise Bullion III M.D   On: 12/07/2018 16:20   Dg Hip Unilat W Or Wo Pelvis 2-3 Views Right  Result Date: 12/06/2018 CLINICAL DATA:  Right hip pain after fall. EXAM:  DG HIP (WITH OR WITHOUT PELVIS) 2-3V RIGHT COMPARISON:  None. FINDINGS: Mildly displaced fracture is seen involving the intertrochanteric region of the proximal right femur. Vascular calcifications are noted. Left hip is unremarkable. IMPRESSION: Mildly displaced intertrochanteric fracture of the proximal right femur. Electronically Signed   By: Marijo Conception M.D.   On: 12/06/2018 16:34     ASSESSMENT AND PLAN:   73 year old female with past medical history of diabetes, dementia, history of previous CVA, seizures, hypertension, hyperlipidemia, history of gastroparesis, diabetic neuropathy who presents to the hospital after a mechanical fall noted to have right hip fracture.  1.  Status post fall and right hip fracture- patient has been cleared from a medical perspective for surgery. -Status post right hip intramedullary pinning postop day #1 today.  Continue further care as per orthopedics. -Seen by physical therapy and they recommend short-term rehab.  2.  Diabetes type 2 without complication-continue sliding scale insulin. - BS stable.   3.  Essential hypertension-continue losartan, hydralazine as needed.  4.  Glaucoma-continue latanoprost-Timoptic/brimonidine.  Eyedrops.  5.  Hyperlipidemia-continue atorvastatin.  6.  Depression-continue Effexor.  7.  History of previous CVA-will resume Coumadin now postoperatively.  All the records are reviewed and case discussed with Care Management/Social Worker. Management plans discussed with the patient, family and they are in agreement.  CODE STATUS: Full code  DVT Prophylaxis: coumadin  TOTAL TIME TAKING CARE OF THIS PATIENT: 25 minutes.   POSSIBLE D/C IN 2-3 DAYS, DEPENDING ON CLINICAL CONDITION.  Henreitta Leber M.D on 12/08/2018 at 1:23 PM  Between 7am to 6pm - Pager - [phone]  After 6pm go to [email or web address] - Proofreader  [facility]  Office  [phone]  CC: Primary care  physician; Juluis Pitch, MD

## 2018-12-08 NOTE — Progress Notes (Signed)
Subjective:  POD #1 s/p intramedullary fixation of right intertrochanteric hip fracture.   Patient reports right hip pain as mild.  Patient is in no acute distress laying in bed.  She is more alert and oriented today.  Objective:   VITALS:   Vitals:   12/08/18 0145 12/08/18 0502 12/08/18 0804 12/08/18 1225  BP: (!) 124/56 (!) 142/58 134/61 (!) 136/57  Pulse: 88 (!) 101 (!) 113 100  Resp: 16 16    Temp: 98.4 F (36.9 C) (!) 97.5 F (36.4 C)  98.6 F (37 C)  TempSrc: Axillary Oral  Oral  SpO2: 99% 98% 100% 100%  Weight:        PHYSICAL EXAM: Right lower extremity: I personally change the patient's right hip dressings today which had scant dried blood on them.  She is otherwise Neurovascular intact Sensation intact distally Intact pulses distally Dorsiflexion/Plantar flexion intact Incision: no drainage No cellulitis present Compartment soft  LABS  Results for orders placed or performed during the hospital encounter of 12/06/18 (from the past 24 hour(s))  Glucose, capillary     Status: Abnormal   Collection Time: 12/07/18  3:09 PM  Result Value Ref Range   Glucose-Capillary 140 (H) 70 - 99 mg/dL  Glucose, capillary     Status: Abnormal   Collection Time: 12/07/18  4:34 PM  Result Value Ref Range   Glucose-Capillary 213 (H) 70 - 99 mg/dL  Glucose, capillary     Status: Abnormal   Collection Time: 12/07/18  8:54 PM  Result Value Ref Range   Glucose-Capillary 196 (H) 70 - 99 mg/dL  CBC     Status: Abnormal   Collection Time: 12/08/18  6:07 AM  Result Value Ref Range   WBC 8.8 4.0 - 10.5 K/uL   RBC 2.85 (L) 3.87 - 5.11 MIL/uL   Hemoglobin 8.8 (L) 12.0 - 15.0 g/dL   HCT 27.0 (L) 36.0 - 46.0 %   MCV 94.7 80.0 - 100.0 fL   MCH 30.9 26.0 - 34.0 pg   MCHC 32.6 30.0 - 36.0 g/dL   RDW 12.6 11.5 - 15.5 %   Platelets 159 150 - 400 K/uL   nRBC 0.0 0.0 - 0.2 %  Basic metabolic panel     Status: Abnormal   Collection Time: 12/08/18  6:07 AM  Result Value Ref Range    Sodium 137 135 - 145 mmol/L   Potassium 5.1 3.5 - 5.1 mmol/L   Chloride 105 98 - 111 mmol/L   CO2 18 (L) 22 - 32 mmol/L   Glucose, Bld 394 (H) 70 - 99 mg/dL   BUN 26 (H) 8 - 23 mg/dL   Creatinine, Ser 0.66 0.44 - 1.00 mg/dL   Calcium 8.3 (L) 8.9 - 10.3 mg/dL   GFR calc non Af Amer >60 >60 mL/min   GFR calc Af Amer >60 >60 mL/min   Anion gap 14 5 - 15  Protime-INR     Status: None   Collection Time: 12/08/18  6:07 AM  Result Value Ref Range   Prothrombin Time 14.5 11.4 - 15.2 seconds   INR 1.1 0.8 - 1.2  Glucose, capillary     Status: Abnormal   Collection Time: 12/08/18  7:50 AM  Result Value Ref Range   Glucose-Capillary 365 (H) 70 - 99 mg/dL   Comment 1 Notify RN   Glucose, capillary     Status: Abnormal   Collection Time: 12/08/18 12:04 PM  Result Value Ref Range   Glucose-Capillary 266 (H)  70 - 99 mg/dL   Comment 1 Notify RN     Dg Chest 1 View  Result Date: 12/06/2018 CLINICAL DATA:  Right hip fracture, fall. EXAM: CHEST  1 VIEW COMPARISON:  Radiographs of November 11, 2017. FINDINGS: The heart size and mediastinal contours are within normal limits. Both lungs are clear. Atherosclerosis of thoracic aorta is noted. No pneumothorax or pleural effusion is noted. Status post left shoulder arthroplasty. IMPRESSION: No active disease. Aortic Atherosclerosis (ICD10-I70.0). Electronically Signed   By: Marijo Conception M.D.   On: 12/06/2018 16:36   Dg Shoulder Right  Result Date: 12/06/2018 CLINICAL DATA:  Right arm pain EXAM: RIGHT SHOULDER - 2+ VIEW COMPARISON:  None. FINDINGS: No acute fracture or dislocation is noted. No soft tissue changes are seen. IMPRESSION: No acute abnormality noted. Electronically Signed   By: Inez Catalina M.D.   On: 12/06/2018 23:01   Dg Elbow 2 Views Right  Result Date: 12/06/2018 CLINICAL DATA:  Right arm pain EXAM: RIGHT ELBOW - 2 VIEW COMPARISON:  None. FINDINGS: No acute fracture or dislocation is noted. No joint effusion is seen. No other focal  abnormality is noted. IMPRESSION: No acute abnormality seen. Electronically Signed   By: Inez Catalina M.D.   On: 12/06/2018 23:02   Dg Wrist 2 Views Right  Result Date: 12/06/2018 CLINICAL DATA:  Right arm pain EXAM: RIGHT WRIST - 2 VIEW COMPARISON:  05/25/2015 FINDINGS: Fixation sideplate is noted along the distal right radius with multiple fixation screws. Mild degenerative changes of the radiocarpal joint are seen. No acute fracture or dislocation is noted. Vascular calcifications are noted. IMPRESSION: Status post ORIF of distal right radial fracture. No acute bony abnormality is seen. Electronically Signed   By: Inez Catalina M.D.   On: 12/06/2018 23:02   Ct Head Wo Contrast  Result Date: 12/06/2018 CLINICAL DATA:  Fall down stairs. EXAM: CT HEAD WITHOUT CONTRAST CT CERVICAL SPINE WITHOUT CONTRAST TECHNIQUE: Multidetector CT imaging of the head and cervical spine was performed following the standard protocol without intravenous contrast. Multiplanar CT image reconstructions of the cervical spine were also generated. COMPARISON:  CT of the head on 05/28/2018 FINDINGS: CT HEAD FINDINGS Brain: Stable advanced small vessel ischemic disease of the periventricular white matter and multiple old small vessel infarcts, particularly in the region of the left basal ganglia and internal capsule. The brain demonstrates no evidence of hemorrhage, acute infarction, edema, mass effect, extra-axial fluid collection, hydrocephalus or mass lesion. Vascular: No hyperdense vessel or unexpected calcification. Skull: Normal. Negative for fracture or focal lesion. Sinuses/Orbits: No acute finding. Other: None. CT CERVICAL SPINE FINDINGS Alignment: Mild anterolisthesis C3 on C4 of approximately 3 mm. Skull base and vertebrae: No acute fracture identified. Soft tissues and spinal canal: No prevertebral soft tissue swelling or incidental masses. Disc levels: Moderate degenerative disc disease most prominently at C4-5 and C5-6.  Milder degenerative disc disease at C3-4 and C6-7. Upper chest: Negative. Other: Calcified plaque present at both carotid bifurcations. IMPRESSION: 1. 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 No evidence of acute brain injury, hemorrhage or skull fracture. 2. No evidence of acute cervical injury. Moderate degenerative disc disease present at C4-5 and C5-6 with milder disc disease at C3-4 and C6-7. Associated mild anterolisthesis of C3 on C4 of approximately 3 mm. Electronically Signed   By: Aletta Edouard M.D.   On: 12/06/2018 16:35   Ct Cervical Spine Wo Contrast  Result Date: 12/06/2018 CLINICAL DATA:  Fall  down stairs. EXAM: CT HEAD WITHOUT CONTRAST CT CERVICAL SPINE WITHOUT CONTRAST TECHNIQUE: Multidetector CT imaging of the head and cervical spine was performed following the standard protocol without intravenous contrast. Multiplanar CT image reconstructions of the cervical spine were also generated. COMPARISON:  CT of the head on 05/28/2018 FINDINGS: CT HEAD FINDINGS Brain: Stable advanced small vessel ischemic disease of the periventricular white matter and multiple old small vessel infarcts, particularly in the region of the left basal ganglia and internal capsule. The brain demonstrates no evidence of hemorrhage, acute infarction, edema, mass effect, extra-axial fluid collection, hydrocephalus or mass lesion. Vascular: No hyperdense vessel or unexpected calcification. Skull: Normal. Negative for fracture or focal lesion. Sinuses/Orbits: No acute finding. Other: None. CT CERVICAL SPINE FINDINGS Alignment: Mild anterolisthesis C3 on C4 of approximately 3 mm. Skull base and vertebrae: No acute fracture identified. Soft tissues and spinal canal: No prevertebral soft tissue swelling or incidental masses. Disc levels: Moderate degenerative disc disease most prominently at C4-5 and C5-6. Milder degenerative disc disease at C3-4 and C6-7. Upper chest: Negative. Other:  Calcified plaque present at both carotid bifurcations. IMPRESSION: 1. 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 No evidence of acute brain injury, hemorrhage or skull fracture. 2. No evidence of acute cervical injury. Moderate degenerative disc disease present at C4-5 and C5-6 with milder disc disease at C3-4 and C6-7. Associated mild anterolisthesis of C3 on C4 of approximately 3 mm. Electronically Signed   By: Aletta Edouard M.D.   On: 12/06/2018 16:35   Dg Hip Port Unilat With Pelvis 1v Right  Result Date: 12/07/2018 CLINICAL DATA:  Status post right hip replacement. EXAM: DG HIP (WITH OR WITHOUT PELVIS) 1V PORT RIGHT COMPARISON:  None. FINDINGS: A gamma nail and intramedullary rod cross the intertrochanteric fracture of the right hip. A distal interlocking screw is noted. Hardware is in good position. Skin staples and postoperative soft tissue air noted. IMPRESSION: Right hip repair as above.  Hardware is in good position. Electronically Signed   By: Dorise Bullion III M.D   On: 12/07/2018 16:19   Dg Hip Operative Unilat W Or W/o Pelvis Right  Result Date: 12/07/2018 CLINICAL DATA:  Right hip fracture repair. FLUOROSCOPY TIME:  1 minutes 29 seconds. Images: 6 EXAM: OPERATIVE RIGHT HIP (WITH PELVIS IF PERFORMED) 6 VIEWS TECHNIQUE: Fluoroscopic spot image(s) were submitted for interpretation post-operatively. COMPARISON:  None. FINDINGS: By the end of the study, a gamma nail and intramedullary rod have been placed across the right hip fracture with a distal interlocking screw. IMPRESSION: Right hip fracture repair as above. Electronically Signed   By: Dorise Bullion III M.D   On: 12/07/2018 16:20   Dg Hip Unilat W Or Wo Pelvis 2-3 Views Right  Result Date: 12/06/2018 CLINICAL DATA:  Right hip pain after fall. EXAM: DG HIP (WITH OR WITHOUT PELVIS) 2-3V RIGHT COMPARISON:  None. FINDINGS: Mildly displaced fracture is seen involving the intertrochanteric region of the  proximal right femur. Vascular calcifications are noted. Left hip is unremarkable. IMPRESSION: Mildly displaced intertrochanteric fracture of the proximal right femur. Electronically Signed   By: Marijo Conception M.D.   On: 12/06/2018 16:34    Assessment/Plan: 1 Day Post-Op   Active Problems:   Intertrochanteric fracture of right hip ([place])  Patient is stable postop.  Her hemoglobin and hematocrit remain at acceptable levels and she does not require transfusion today.  Has an elevated serum glucose level on this a.m.'s labs.  INR is  1.1.  Patient is been restarted on her warfarin with dosing per the pharmacy.  She has completed 24 hours of postop antibiotics.  She is weightbearing as tolerated on the right lower extremity.  Continue physical therapy as the patient can tolerate.  Foley catheter is been removed.  Labs will be rechecked in the morning.    Th[place] , MD 12/08/2018, 1:32 PM

## 2018-12-08 NOTE — Progress Notes (Signed)
ANTICOAGULATION CONSULT NOTE - Initial Consult  Pharmacy Consult for  Warfarin  Indication: stroke prophylaxis  Allergies  Allergen Reactions  . Codeine Nausea Only and Other (See Comments)    Reaction: stress attacks/ headache Can take Vicodin and Percocet   . Meperidine Nausea Only and Other (See Comments)    Reaction: stress attacks/ headache  . Metoclopramide Hcl Nausea Only and Other (See Comments)    Reaction: stress attacks/ headache  . Oxycodone Nausea Only and Other (See Comments)    Reaction: stress attacks/ headache  . Stadol [Butorphanol] Nausea Only and Other (See Comments)    Reaction: stress attacks/ headache  . Tussionex Pennkinetic Er [Hydrocod Polst-Cpm Polst Er] Other (See Comments)    Filled patient with sugar   . Carbocaine  [Mepivacaine Hcl] Palpitations    Patient Measurements: Weight: 136 lb (61.7 kg) Heparin Dosing Weight:   Vital Signs: Temp: 97.5 F (36.4 C) (04/19 0502) Temp Source: Oral (04/19 0502) BP: 134/61 (04/19 0804) Pulse Rate: 113 (04/19 0804)  Labs: Recent Labs    12/06/18 1546 12/07/18 0529 12/08/18 0607  HGB 11.5* 11.6* 8.8*  HCT 34.6* 35.6* 27.0*  PLT 191 173 159  LABPROT 22.1* 17.7* 14.5  INR 2.0* 1.5* 1.1  CREATININE 0.53 0.58 0.66    Estimated Creatinine Clearance: 53.2 mL/min (by C-G formula based on SCr of 0.66 mg/dL).   Medical History: Past Medical History:  Diagnosis Date  . At high risk for falls   . Basal cell carcinoma    right upper leg  . Complication of anesthesia    difficult to arouse  . Diabetes mellitus without complication (HCC)    Type I  . Diabetic neuropathy ([place])   . Diabetic retinopathy ([place])   . Dizziness   . Gastroparesis   . Herniated disc, cervical   . Hypercholesteremia   . Hypertension   . Hypotensive episode   . Hypothyroidism   . Insulin pump in place   . Myofascial muscle pain   . Numbness of arm    left  . Osteoporosis   . Poor vision   . Seizures ([place])   . Slurred  speech   . 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 Stroke (HCC)    x5  . Vertigo   . Wears glasses     Medications:  Medications Prior to Admission  Medication Sig Dispense Refill Last Dose  . aspirin EC 81 MG tablet Take 81 mg by mouth daily.   12/06/2018 at 0800  . atorvastatin (LIPITOR) 40 MG tablet Take 1 tablet (40 mg total) by mouth daily. 30 tablet 0 12/05/2018 at 1800  . brimonidine-timolol (COMBIGAN) 0.2-0.5 % ophthalmic solution Place 1 drop into both eyes 3 (three) times daily. Instill one drop in right eye morning, noon and bedtime   12/06/2018 at 1300  . Calcium Carb-Cholecalciferol (CALCIUM 600 + D) 600-200 MG-UNIT TABS Take 1 tablet by mouth every evening.   12/06/2018 at 0800  . Cholecalciferol (VITAMIN D-3) 5000 units TABS Take 5,000 Units by mouth daily.   12/06/2018 at 0800  . ferrous sulfate 325 (65 FE) MG tablet Take 325 mg by mouth daily with breakfast.   12/06/2018 at 0800  . gabapentin (NEURONTIN) 300 MG capsule 300 mg in the morning, 300 mg in the afternoon, 900 mg in the evening 150 capsule 0 12/06/2018 at 1200  . Garlic 7654 MG CAPS Take 1 capsule by mouth daily.    12/06/2018 at 0800  . losartan (COZAAR)  50 MG tablet Take 1 tablet (50 mg total) by mouth daily for 30 days. 30 tablet 0 12/06/2018 at 0800  . Multiple Vitamins-Minerals (CEROVITE ADVANCED FORMULA PO) Take 1 tablet by mouth daily.   12/06/2018 at 0800  . Netarsudil-Latanoprost (ROCKLATAN) 0.02-0.005 % SOLN Apply 1 drop to eye at bedtime. Instill one drop in right eye at bedtime   12/05/2018 at 2000  . promethazine (PHENERGAN) 25 MG suppository Place 25 mg rectally every 6 (six) hours as needed for nausea or vomiting.   prn at prn  . venlafaxine (EFFEXOR) 75 MG tablet Take 1 tablet (75 mg total) by mouth 2 (two) times daily. 60 tablet 0 12/06/2018 at 0800  . vitamin C (ASCORBIC ACID) 500 MG tablet Take 500 mg by mouth 2 (two) times daily.   12/06/2018 at 0800  . warfarin (COUMADIN) 5 MG tablet Take 1 tablet (5 mg total)  by mouth See admin instructions. Managed by Dr. Merian Capron, Mon, Wed, Fri, Sat (Patient taking differently: Take 7.5 mg by mouth See admin instructions. Take one and one-half tablets (7.5 mg) by mouth daily. Managed by Dr. Ouida Sills) 10 tablet 0 12/05/2018 at 1800  . acetaminophen (TYLENOL) 325 MG tablet Take 2 tablets (650 mg total) by mouth every 6 (six) hours as needed for headache. For mild pain 30 tablet 0 prn at prn  . ACETAMINOPHEN-BUTALBITAL 50-325 MG TABS Take 1 tablet by mouth 2 (two) times daily as needed (headache). (Patient not taking: Reported on 12/06/2018) 180 each 1 Not Taking at prn  . butalbital-acetaminophen-caffeine (FIORICET) 50-325-40 MG tablet Take 1 tablet by mouth 2 (two) times daily.   prn at prn  . carboxymethylcellulose (REFRESH PLUS) 0.5 % SOLN Place 1 drop into both eyes as needed (for dry eyes). 15 mL 0 prn at prn  . cetirizine (ZYRTEC) 10 MG tablet Take 1 tablet (10 mg total) by mouth daily. 30 tablet 0 prn at prn  . glucose 4 GM chewable tablet Chew 1 tablet (4 g total) by mouth as needed for low blood sugar. <65 50 tablet 0 prn at prn  . insulin aspart (NOVOLOG FLEXPEN) 100 UNIT/ML FlexPen Inject 0-15 Units into the skin 3 (three) times daily with meals. Inject 0-15 units subcutaneous per sliding scale before meals and at bedtime 0 - 100 units = 0 units 101 - 120 = 1 unit 121 - 150 = 2 units 151 - 200 = 4 units 201 - 250 = 6 units 251 - 300 = 8 units 301 - 350 = 10 units 351 - 400 = 12 units 401 - 450 = 15 units > 450 call MD 15 mL 0   . Insulin Glargine (LANTUS SOLOSTAR) 100 UNIT/ML Solostar Pen Inject 12 Units into the skin at bedtime. (Patient not taking: Reported on 12/06/2018) 15 mL 0 Not Taking at Unknown time  . linaclotide (LINZESS) 72 MCG capsule Take 1 capsule (72 mcg total) by mouth daily for 30 days. 30 capsule 0   . NON FORMULARY Diet type:  NCS   Taking  . polyethylene glycol (MIRALAX / GLYCOLAX) packet Take 17 g by mouth daily.    prn at prn  .  senna (SENOKOT) 8.6 MG TABS tablet Take 2 tablets by mouth 2 (two) times daily.   prn at prn  . warfarin (COUMADIN) 6 MG tablet Take 1 tablet (6 mg total) by mouth daily. Once A Day on Tue, Thu (Patient not taking: Reported on 12/06/2018) 4 tablet 0 Not Taking at Unknown  time    Assessment: Pharmacy consulted to dose warfarin for stroke prophylaxis in this 73 year old female admitted for surgical repair of right radial fracture.   4/17:  INR = 2.0 4/18:  INR = 1.5  4/19:  INR = 1.1  Pt was on warfarin 7.5 mg PO daily at home.  Goal of Therapy:  INR 2-3  Plan:  Will continue pt's home dose of warfarin 7.5 mg PO daily and recheck INR with AM labs.   Paulina Fusi, PharmD, BCPS 12/08/2018 10:37 AM

## 2018-12-09 ENCOUNTER — Encounter: Payer: Self-pay | Admitting: Orthopedic Surgery

## 2018-12-09 LAB — CBC
HCT: 24.6 % — ABNORMAL LOW (ref 36.0–46.0)
Hemoglobin: 7.9 g/dL — ABNORMAL LOW (ref 12.0–15.0)
MCH: 30.4 pg (ref 26.0–34.0)
MCHC: 32.1 g/dL (ref 30.0–36.0)
MCV: 94.6 fL (ref 80.0–100.0)
Platelets: 166 10*3/uL (ref 150–400)
RBC: 2.6 MIL/uL — ABNORMAL LOW (ref 3.87–5.11)
RDW: 12.8 % (ref 11.5–15.5)
WBC: 8.2 10*3/uL (ref 4.0–10.5)
nRBC: 0 % (ref 0.0–0.2)

## 2018-12-09 LAB — BASIC METABOLIC PANEL
Anion gap: 11 (ref 5–15)
BUN: 24 mg/dL — ABNORMAL HIGH (ref 8–23)
CO2: 20 mmol/L — ABNORMAL LOW (ref 22–32)
Calcium: 8.3 mg/dL — ABNORMAL LOW (ref 8.9–10.3)
Chloride: 108 mmol/L (ref 98–111)
Creatinine, Ser: 0.54 mg/dL (ref 0.44–1.00)
GFR calc Af Amer: >60 mL/min (ref >60)
GFR calc non Af Amer: >60 mL/min (ref >60)
Glucose, Bld: 335 mg/dL — ABNORMAL HIGH (ref 70–99)
Potassium: 4.6 mmol/L (ref 3.5–5.1)
Sodium: 139 mmol/L (ref 135–145)

## 2018-12-09 LAB — GLUCOSE, CAPILLARY
Glucose-Capillary: 404 mg/dL — ABNORMAL HIGH (ref 70–99)
Glucose-Capillary: 211 mg/dL — ABNORMAL HIGH (ref 70–99)
Glucose-Capillary: 176 mg/dL — ABNORMAL HIGH (ref 70–99)
Glucose-Capillary: 110 mg/dL — ABNORMAL HIGH (ref 70–99)

## 2018-12-09 LAB — PROTIME-INR
INR: 1.3 — ABNORMAL HIGH (ref 0.8–1.2)
Prothrombin Time: 16.2 seconds — ABNORMAL HIGH (ref 11.4–15.2)

## 2018-12-09 MED ORDER — INSULIN GLARGINE 100 UNIT/ML ~~LOC~~ SOLN
10.0000 [IU] | Freq: Every day | SUBCUTANEOUS | Status: DC
  Administered 2018-12-09 – 2018-12-10 (×2): 10 [IU] via SUBCUTANEOUS
  Filled 2018-12-09 (×2): qty 0.1

## 2018-12-09 MED ORDER — INSULIN ASPART 100 UNIT/ML ~~LOC~~ SOLN: 0.0000 [IU] | Freq: Every day | SUBCUTANEOUS | Status: DC

## 2018-12-09 MED ORDER — INSULIN ASPART 100 UNIT/ML ~~LOC~~ SOLN
3.0000 [IU] | Freq: Three times a day (TID) | SUBCUTANEOUS | Status: DC
  Administered 2018-12-09 – 2018-12-10 (×4): 3 [IU] via SUBCUTANEOUS
  Filled 2018-12-09 (×4): qty 1

## 2018-12-09 MED ORDER — INSULIN ASPART 100 UNIT/ML ~~LOC~~ SOLN
0.0000 [IU] | Freq: Three times a day (TID) | SUBCUTANEOUS | Status: DC
  Administered 2018-12-09: 17:00:00 2 [IU] via SUBCUTANEOUS
  Administered 2018-12-09: 13:00:00 3 [IU] via SUBCUTANEOUS
  Administered 2018-12-10: 1 [IU] via SUBCUTANEOUS
  Administered 2018-12-10: 3 [IU] via SUBCUTANEOUS
  Filled 2018-12-09 (×4): qty 1

## 2018-12-09 MED ORDER — WARFARIN SODIUM 10 MG PO TABS
10.0000 mg | ORAL_TABLET | Freq: Once | ORAL | Status: AC
  Administered 2018-12-09: 17:00:00 10 mg via ORAL
  Filled 2018-12-09: qty 1

## 2018-12-09 NOTE — TOC Initial Note (Signed)
Transition of Care [facility]) - Initial/Assessment Note    Patient Details  Name: Erin Daniels MRN: [id number] Date of Birth: Apr 02, 1946  Transition [facility]) CM/SW Contact:    Su Hilt, RN Phone Number: 12/09/2018, 3:26 PM  Clinical Narrative:                 Met with the patient to discuss DC plan and needs She stated that she lives in a single story home with her husband, she has a walker, a BSC and a raised toilet seat, she has used Home Health services with [facility] in the past She gives permission to do a bed search for a SNF bed for rehab.   Expected Discharge Plan: Skilled Nursing Facility Barriers to Discharge: Continued Medical Work up   Patient Goals and CMS Choice Patient states their goals for this hospitalization and ongoing recovery are:: to get better CMS Medicare.gov Compare Post Acute Care list provided to:: Patient Represenative (must comment)(spouse)    Expected Discharge Plan and Services Expected Discharge Plan: [place]   Discharge Planning Services: CM Consult   Living arrangements for the past 2 months: Single Family Home                          Prior Living Arrangements/Services Living arrangements for the past 2 months: Single Family Home Lives with:: Spouse Patient language and need for interpreter reviewed:: No Do you feel safe going back to the place where you live?: Yes      Need for Family Participation in Patient Care: No (Comment) Care giver support system in place?: Yes (comment) Current home services: DME(has a walker a BSC and a raised toilet  seat at home) Criminal Activity/Legal Involvement Pertinent to Current Situation/Hospitalization: No - Comment as needed  Activities of Daily Living Home Assistive Devices/Equipment: None ADL Screening (condition at time of admission) Patient's cognitive ability adequate to safely complete daily activities?: No Is the patient deaf or have  difficulty hearing?: No Does the patient have difficulty seeing, even when wearing glasses/contacts?: No Does the patient have difficulty concentrating, remembering, or making decisions?: Yes Patient able to express need for assistance with ADLs?: Yes Does the patient have difficulty dressing or bathing?: (unable to determine) Independently performs ADLs?: (unable to determine at this time) Does the patient have difficulty walking or climbing stairs?: Yes Weakness of Legs: Right Weakness of Arms/Hands: None  Permission Sought/Granted Permission sought to share information with : Facility Art therapist granted to share information with : Yes, Verbal Permission Granted     Permission granted to share info w AGENCY: any SNF agency        Emotional Assessment Appearance:: Appears stated age Attitude/Demeanor/Rapport: Engaged Affect (typically observed): Accepting Orientation: : Oriented to Self, Oriented to Place, Oriented to  Time, Oriented to Situation Alcohol / Substance Use: Never Used Psych Involvement: No (comment)  Admission diagnosis:  Fall, initial encounter [W19.XXXA] Closed fracture of right hip, initial encounter [facility]) [S72.001A] Patient Active Problem List   Diagnosis Date Noted  . Intertrochanteric fracture of right hip ([place]) 12/08/2018  . Hypercoagulopathy ([place]) 09/29/2018  . Acute anxiety 09/29/2018  . Hypertension associated with type 1 diabetes mellitus ([place]) 09/19/2018  . Dyslipidemia due to type 1 diabetes mellitus ([place]) 09/19/2018  . Anemia of chronic disease 09/19/2018  . Chronic migraine without aura 09/19/2018  . Chronic non-seasonal allergic rhinitis 09/19/2018  . Major depression, recurrent, chronic ([place]) 09/19/2018  .  Diabetic gastroparesis associated with type 1 diabetes mellitus ([place]) 09/19/2018  . Bilateral sacral insufficiency fracture 09/19/2018  . Constipation 09/15/2018  . Acute urinary retention 09/15/2018  . Intractable  pain 09/15/2018  . Chronic cerebrovascular accident (CVA) 05/28/2018  . Diabetic polyneuropathy associated with type 1 diabetes mellitus ([place]) 12/10/2017  . Numbness and tingling of both legs 12/10/2017  . Chronic pain syndrome 12/10/2017  . DKA (diabetic ketoacidoses) ([place]) 11/12/2017  . Confusion 11/11/2017  . Hypoglycemia 11/26/2016  . Lactic acid acidosis   . Sepsis ([place])   . 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 Essential hypertension   . Depression   . Right radial fracture 05/29/2015   PCP:  Juluis Pitch, MD Pharmacy:   [place], [address] [place] [address] Phone: [phone] Fax: [phone]  [facility] [place], [place] [address] [place] [address] Phone: [phone] Fax: [phone] [place], [place] - [place] [facility] [place] [address] [place] [address] Phone: [phone] Fax: [phone] [place], [place] [address] Phone: [phone] Fax: [phone]     Social Determinants of Health (SDOH) Interventions    Readmission Risk Interventions Readmission Risk Prevention Plan 12/07/2018  Transportation Screening Complete  HRI or Home Care Consult Complete  Social Work Consult for [place] Planning/Counseling Complete  Medication Review Press [employer]) Complete  Some recent data might be hidden

## 2018-12-09 NOTE — Progress Notes (Signed)
Physical Therapy Treatment Patient Details Name: Erin Daniels MRN: [id number] DOB: [birth year] Today's Date: 12/09/2018    History of Present Illness Pt is a 73 yo female patient with history of osteoporosis, herniated disks in the cervical area, hypertension, hypothyroidism, type 1 diabetes mellitus on insulin pump, CVA in the past currently under hospitalist service for lower lumbar sacral pain.  Assessment includes: R hip fracture with internal fixation.    PT Comments    Pt noted to be incontinent of bowel and NT notified and came to assist with clean-up in bed prior to OOB mobility (mod assist for logrolling L and R in bed with pillow between pt's knees and vc's/tactile cues for use of bedrail and technique).  2 assist provided semi-supine to sit d/t pt's reports of R hip pain.  Pt able to stand with min assist x2 and then take steps bed to recliner with RW with CGA to min assist x2.  Pt incontinent of bowel taking steps to chair requiring clean-up prior to sitting down in chair.  Bowel appearing darker in color (nurse notified).  Will continue to progress pt with strengthening and progressive functional mobility per pt tolerance.    Follow Up Recommendations  SNF     Equipment Recommendations  Rolling walker with 5" wheels;3in1 (PT)    Recommendations for Other Services OT consult     Precautions / Restrictions Precautions Precautions: Fall Restrictions Weight Bearing Restrictions: Yes RLE Weight Bearing: Weight bearing as tolerated    Mobility  Bed Mobility Overal bed mobility: Needs Assistance Bed Mobility: Supine to Sit     Supine to sit: +2 for physical assistance     General bed mobility comments: 2 assist semi-supine to sit d/t pt's concerns of R LE pain  Transfers Overall transfer level: Needs assistance Equipment used: Rolling walker (2 wheeled) Transfers: Sit to/from Stand Sit to Stand: Min assist;+2 physical assistance         General transfer  comment: assist to initiate and come to full stand; vc's for UE/LE placement  Ambulation/Gait Ambulation/Gait assistance: Min guard;Min assist;+2 physical assistance Gait Distance (Feet): 3 Feet(bed to recliner) Assistive device: Rolling walker (2 wheeled)   Gait velocity: decreased   General Gait Details: antalgic; decreased stance time R LE; vc's for walker use and to increase UE support through [employer]    Modified Rankin (Stroke Patients Only)       Balance Overall balance assessment: History of Falls;Needs assistance Sitting-balance support: Bilateral upper extremity supported;Feet supported Sitting balance-Leahy Scale: Fair Sitting balance - Comments: steady static sitting   Standing balance support: Bilateral upper extremity supported Standing balance-Leahy Scale: Poor Standing balance comment: pt requiring B UE support on RW for static standing balance                            Cognition Arousal/Alertness: Awake/alert Behavior During Therapy: WFL for tasks assessed/performed Overall Cognitive Status: No family/caregiver present to determine baseline cognitive functioning                                 General Comments: Oriented to self; did not remember having surgery but did remember fall      Exercises Total Joint Exercises Ankle Circles/Pumps: AROM;Strengthening;Both;10 reps;Supine Short Arc Quad: AAROM;Strengthening;Right;10 reps;Supine Heel Slides: AAROM;Strengthening;Right;10 reps;Supine Hip  ABduction/ADduction: AAROM;Strengthening;Right;10 reps;Supine    General Comments   Nursing cleared pt for participation in physical therapy.  Pt agreeable to PT session.      Pertinent Vitals/Pain Pain Assessment: Faces Faces Pain Scale: Hurts a little bit(6/10 with activity; 2/10 at rest) Pain Location: R hip Pain Descriptors / Indicators: Sore;Tender;Grimacing;Guarding Pain  Intervention(s): Limited activity within patient's tolerance;Monitored during session;Premedicated before session;Repositioned  Vitals (HR and O2 on room air) stable and WFL throughout treatment session.    Home Living                      Prior Function            PT Goals (current goals can now be found in the care plan section) Acute Rehab PT Goals PT Goal Formulation: With patient Progress towards PT goals: Progressing toward goals    Frequency    BID      PT Plan Current plan remains appropriate    Co-evaluation              AM-PAC PT "6 Clicks" Mobility   Outcome Measure  Help needed turning from your back to your side while in a flat bed without using bedrails?: A Lot Help needed moving from lying on your back to sitting on the side of a flat bed without using bedrails?: A Lot Help needed moving to and from a bed to a chair (including a wheelchair)?: A Lot Help needed standing up from a chair using your arms (e.g., wheelchair or bedside chair)?: A Lot Help needed to walk in hospital room?: A Lot Help needed climbing 3-5 steps with a railing? : Total 6 Click Score: 11    End of Session Equipment Utilized During Treatment: Gait belt;Oxygen Activity Tolerance: Patient tolerated treatment well Patient left: in chair;with call bell/phone within reach;with chair alarm set;with SCD's reapplied;Other (comment)(B heels elevated via pillow) Nurse Communication: Mobility status;Precautions;Weight bearing status;Other (comment)(pt stool appearing dark; purewick removed) PT Visit Diagnosis: Unsteadiness on feet (R26.81);History of falling (Z91.81);Muscle weakness (generalized) (M62.81);Difficulty in walking, not elsewhere classified (R26.2);Pain Pain - Right/Left: Right Pain - part of body: Hip     Time: 8309-4076 PT Time Calculation (min) (ACUTE ONLY): 43 min  Charges:  $Therapeutic Exercise: 8-22 mins $Therapeutic Activity: 23-37 mins                      Leitha Bleak, PT 12/09/18, 12:48 PM [phone]

## 2018-12-09 NOTE — NC FL2 (Signed)
[place] LEVEL OF CARE SCREENING TOOL     IDENTIFICATION  Patient Name: Erin Daniels Birthdate: [birth year] Sex: female Admission Date (Current Location): 12/06/2018  [place] and [place] Number:  Engineering geologist and Address:  [facility], [address], [place],  [address]      Provider Number: [id number]  Attending Physician Name and Address:  Henreitta Leber, MD  Relative Name and Phone Number:  Darthy Manganelli Husband [phone]    Current Level of Care: Hospital Recommended Level of Care: [place] Prior Approval Number:    Date Approved/Denied: 09/16/18 PASRR Number: [id number]  Discharge Plan: SNF    Current Diagnoses: Patient Active Problem List   Diagnosis Date Noted  . Intertrochanteric fracture of right hip ([place]) 12/08/2018  . Hypercoagulopathy ([place]) 09/29/2018  . Acute anxiety 09/29/2018  . Hypertension associated with type 1 diabetes mellitus ([place]) 09/19/2018  . Dyslipidemia due to type 1 diabetes mellitus ([place]) 09/19/2018  . Anemia of chronic disease 09/19/2018  . Chronic migraine without aura 09/19/2018  . Chronic non-seasonal allergic rhinitis 09/19/2018  . Major depression, recurrent, chronic ([place]) 09/19/2018  . Diabetic gastroparesis associated with type 1 diabetes mellitus ([place]) 09/19/2018  . Bilateral sacral insufficiency fracture 09/19/2018  . Constipation 09/15/2018  . Acute urinary retention 09/15/2018  . Intractable pain 09/15/2018  . Chronic cerebrovascular accident (CVA) 05/28/2018  . Diabetic polyneuropathy associated with type 1 diabetes mellitus ([place]) 12/10/2017  . Numbness and tingling of both legs 12/10/2017  . Chronic pain syndrome 12/10/2017  . DKA (diabetic ketoacidoses) ([place]) 11/12/2017  . Confusion 11/11/2017  . Hypoglycemia 11/26/2016  . Lactic acid acidosis   . Sepsis ([place])   . 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 Essential hypertension   . Depression   . Right radial fracture 05/29/2015    Orientation RESPIRATION BLADDER Height & Weight     Self, Time, Situation, Place  Normal Continent Weight: 61.7 kg Height:     BEHAVIORAL SYMPTOMS/MOOD NEUROLOGICAL BOWEL NUTRITION STATUS      Continent Diet(normal)  AMBULATORY STATUS COMMUNICATION OF NEEDS Skin   Extensive Assist Verbally Normal, Surgical wounds                       Personal Care Assistance Level of Assistance  Dressing, Bathing Bathing Assistance: Limited assistance Feeding assistance: Independent Dressing Assistance: Limited assistance Total Care Assistance: Limited assistance   Functional Limitations Info  Sight, Hearing, Speech Sight Info: Adequate Hearing Info: Adequate Speech Info: Adequate    SPECIAL CARE FACTORS FREQUENCY  PT (By licensed PT)     PT Frequency: 5 times per week OT Frequency: 5x per week            Contractures Contractures Info: Not present    Additional Factors Info  Code Status, Allergies Code Status Info: full code Allergies Info: Codeine, Meperidine, Metoclopramide Hcl, Oxycodone, Stadol Butorphanol, Tussionex Pennkinetic Er Hydrocod Polst-cpm Polst Er, Carbocaine  Mepivacaine Hcl           Current Medications (12/09/2018):  This is the current hospital active medication list Current Facility-Administered Medications  Medication Dose Route Frequency Provider Last Rate Last Dose  . acetaminophen (TYLENOL) tablet 325-650 mg  325-650 mg Oral Q6H PRN Thornton Park, MD      . alum & mag hydroxide-simeth (MAALOX/MYLANTA) 200-200-20 MG/5ML suspension 30 mL  30 mL Oral Q4H PRN Thornton Park, MD      .  aspirin EC tablet 81 mg  81 mg Oral Daily Thornton Park, MD   81 mg at 12/09/18 0827  . atorvastatin (LIPITOR) tablet 40 mg  40 mg Oral Daily Thornton Park, MD   40 mg at 12/09/18 0813  . bisacodyl (DULCOLAX) suppository 10 mg  10 mg Rectal Daily PRN Thornton Park, MD       . timolol (TIMOPTIC) 0.5 % ophthalmic solution 1 drop  1 drop Right Eye BID Thornton Park, MD   1 drop at 12/09/18 0813   And  . brimonidine (ALPHAGAN) 0.2 % ophthalmic solution 1 drop  1 drop Right Eye BID Thornton Park, MD   1 drop at 12/09/18 0813  . docusate sodium (COLACE) capsule 100 mg  100 mg Oral BID Thornton Park, MD   100 mg at 12/09/18 0813  . ferrous sulfate tablet 325 mg  325 mg Oral TID Lorre Munroe, MD   325 mg at 12/09/18 1236  . hydrALAZINE (APRESOLINE) injection 5 mg  5 mg Intravenous Q4H PRN Thornton Park, MD      . HYDROcodone-acetaminophen (NORCO) 7.5-325 MG per tablet 1-2 tablet  1-2 tablet Oral Q4H PRN Thornton Park, MD   2 tablet at 12/09/18 1051  . HYDROcodone-acetaminophen (NORCO/VICODIN) 5-325 MG per tablet 1-2 tablet  1-2 tablet Oral Q4H PRN Thornton Park, MD   2 tablet at 12/09/18 0441  . insulin aspart (novoLOG) injection 0-5 Units  0-5 Units Subcutaneous QHS Sainani, Vivek J, MD      . insulin aspart (novoLOG) injection 0-9 Units  0-9 Units Subcutaneous TID WC Henreitta Leber, MD   3 Units at 12/09/18 1235  . insulin aspart (novoLOG) injection 3 Units  3 Units Subcutaneous TID WC Henreitta Leber, MD   3 Units at 12/09/18 1235  . insulin glargine (LANTUS) injection 10 Units  10 Units Subcutaneous Daily Henreitta Leber, MD   10 Units at 12/09/18 1235  . loratadine (CLARITIN) tablet 10 mg  10 mg Oral Daily Thornton Park, MD   10 mg at 12/09/18 2951  . losartan (COZAAR) tablet 50 mg  50 mg Oral Daily Thornton Park, MD   50 mg at 12/09/18 8841  . magnesium citrate solution 1 Bottle  1 Bottle Oral Once PRN Thornton Park, MD      . menthol-cetylpyridinium (CEPACOL) lozenge 3 mg  1 lozenge Oral PRN Thornton Park, MD       Or  . phenol (CHLORASEPTIC) mouth spray 1 spray  1 spray Mouth/Throat PRN Thornton Park, MD      . methocarbamol (ROBAXIN) tablet 500 mg  500 mg Oral Q6H PRN Thornton Park, MD   500 mg at 12/08/18 0835    Or  . methocarbamol (ROBAXIN) 500 mg in dextrose 5 % 50 mL IVPB  500 mg Intravenous Q6H PRN Thornton Park, MD      . morphine 2 MG/ML injection 0.5-1 mg  0.5-1 mg Intravenous Q2H PRN Thornton Park, MD   1 mg at 12/07/18 2147  . Netarsudil-Latanoprost 0.02-0.005 % SOLN 1 drop  1 drop Right Eye QHS Thornton Park, MD   1 drop at 12/08/18 2249  . ondansetron (ZOFRAN) tablet 4 mg  4 mg Oral Q6H PRN Thornton Park, MD       Or  . ondansetron [facility]) injection 4 mg  4 mg Intravenous Q6H PRN Thornton Park, MD   4 mg at 12/07/18 1815  . polyethylene glycol (MIRALAX / GLYCOLAX) packet 17 g  17 g Oral Daily PRN  Thornton Park, MD      . polyvinyl alcohol (LIQUIFILM TEARS) 1.4 % ophthalmic solution 1 drop  1 drop Both Eyes PRN Thornton Park, MD   1 drop at 12/07/18 2149  . promethazine (PHENERGAN) suppository 25 mg  25 mg Rectal Q6H PRN Thornton Park, MD      . venlafaxine [facility]) tablet 75 mg  75 mg Oral BID Thornton Park, MD   75 mg at 12/09/18 8307  . warfarin (COUMADIN) tablet 10 mg  10 mg Oral ONCE-1800 Rito Ehrlich A, RPH      . Warfarin - Pharmacist Dosing Inpatient   Does not apply q1800 Orene Desanctis, [facility]         Discharge Medications: Please see discharge summary for a list of discharge medications.  Relevant Imaging Results:  Relevant Lab Results:   Additional Information [id number]  Su Hilt, RN

## 2018-12-09 NOTE — Evaluation (Signed)
Occupational Therapy Evaluation Patient Details Name: Erin Daniels MRN: [id number] DOB: Jan 28, 1946 Today's Date: 12/09/2018    History of Present Illness Pt is a 73 yo female patient with history of osteoporosis, herniated disks in the cervical area, hypertension, hypothyroidism, type 1 diabetes mellitus on insulin pump, CVA in the past currently under hospitalist service for lower lumbar sacral pain.  Assessment includes: R hip fracture with internal fixation.   Clinical Impression   Pt seen for OT evaluation this date, POD#1 from above surgery. Pt pleasantly confused t/o assessment, however had difficulty providing information on PLOF and may be unreliable historian. Per pt, she was independent in all ADLs prior to surgery. She was using a RW for mobility within her home, but was unable to state why. Pt husband assists with all ADLs including driving. Pt denies falls hx in past six months aside from the fall in which she broke her hip. She stated she was using step-ladder to fix her smoke alarm when she lost her balance and fell. Pt is eager to return to PLOF with less pain and improved safety and independence. Pt currently requires mod assist for LB dressing and bathing while in seated position due to pain and limited AROM of R hip. Minimal of supervision assist recommended for other ADLs d/t level of cognition at time of evaluation. Pt instructed in self care skills, falls prevention strategies, home/routines modifications, DME/AE for LB bathing and dressing tasks, and compression stocking mgt strategies. Pt would benefit from additional instruction in self care skills and techniques to help maintain precautions with or without assistive devices to support recall and carryover prior to discharge. Recommend STR upon discharge.      Follow Up Recommendations  SNF;Supervision/Assistance - 24 hour    Equipment Recommendations  (TBD)    Recommendations for Other Services       Precautions  / Restrictions Precautions Precautions: Fall Restrictions Weight Bearing Restrictions: Yes RLE Weight Bearing: Weight bearing as tolerated      Mobility Bed Mobility Overal bed mobility: Needs Assistance Bed Mobility: Sit to Supine     Supine to sit: +2 for physical assistance Sit to supine: Mod assist   General bed mobility comments: Pt required mod assist for mgt of LEs into bed with sit>sup. Per PT note pt required max assist for sup>sit +2 prior to OT session.   Transfers Overall transfer level: Needs assistance Equipment used: Rolling walker (2 wheeled) Transfers: Sit to/from Stand Sit to Stand: Min assist;Mod assist         General transfer comment: assist to initiate and come to full stand; vc's for UE/LE placement    Balance Overall balance assessment: History of Falls;Needs assistance Sitting-balance support: Bilateral upper extremity supported;Feet supported Sitting balance-Leahy Scale: Fair Sitting balance - Comments: steady static sitting   Standing balance support: Bilateral upper extremity supported Standing balance-Leahy Scale: Poor Standing balance comment: pt requiring B UE support on RW for static standing balance                           ADL either performed or assessed with clinical judgement   ADL Overall ADL's : Needs assistance/impaired Eating/Feeding: Set up;Independent   Grooming: Set up;Minimal assistance   Upper Body Bathing: Set up;Minimal assistance   Lower Body Bathing: Set up;Moderate assistance   Upper Body Dressing : Set up;Cueing for sequencing;With adaptive equipment Upper Body Dressing Details (indicate cue type and reason): Pt  Lower Body Dressing:  Set up;Moderate assistance;Cueing for sequencing Lower Body Dressing Details (indicate cue type and reason): Pt able to demo use of sock-aid on non-operative leg on this date. Required mod assist for foot placement and heavy VCs for sequencing use of sock aid. Level of  cognition likely impacting ADL function.  Toilet Transfer: Set up;Moderate assistance;BSC;Ambulation;RW;+2 for safety/equipment Toilet Transfer Details (indicate cue type and reason): +2 for mgt IV pole. Pt likely +1 assist w/o IV.  Toileting- Clothing Manipulation and Hygiene: Set up;Minimal assistance;Sit to/from stand       Functional mobility during ADLs: Min guard;Rolling walker;+2 for safety/equipment;Cueing for sequencing;Cueing for safety       Vision Baseline Vision/History: Wears glasses Wears Glasses: At all times Patient Visual Report: No change from baseline Additional Comments: Pt had difficulty stating if she wore her glasses all the time or only for reading. She went back and forth, but ultimately stated she wears them at all times. Denies changes to vision.      Perception     Praxis      Pertinent Vitals/Pain Pain Assessment: Faces Faces Pain Scale: Hurts a little bit Pain Location: R hip Pain Descriptors / Indicators: Sore;Tender;Grimacing;Guarding Pain Intervention(s): Limited activity within patient's tolerance;Monitored during session;Premedicated before session;Repositioned     Hand Dominance Right   Extremity/Trunk Assessment Upper Extremity Assessment Upper Extremity Assessment: Overall WFL for tasks assessed   Lower Extremity Assessment Lower Extremity Assessment: Generalized weakness;RLE deficits/detail RLE Deficits / Details: Pt s/p fall and R IM fixation. RLE: Unable to fully assess due to pain RLE Sensation: WNL RLE Coordination: decreased gross motor       Communication Communication Communication: No difficulties   Cognition Arousal/Alertness: Awake/alert Behavior During Therapy: WFL for tasks assessed/performed Overall Cognitive Status: No family/caregiver present to determine baseline cognitive functioning                                 General Comments: Oriented to person, place, situation, and year (not month).  Occasionally appeared to lose focus and required re-direction to task.    General Comments       Exercises  Other Exercises: Pt educated in falls prevention strategies, pet mgt strategies, safe use of AE for mobility LB ADLs, routines modification, and compression stocking mgt on this date. Assisted with LB dressing task (trial of sock-aid on non-operative side) had difficulty with sequencing this task.    Shoulder Instructions      Home Living Family/patient expects to be discharged to:: Private residence Living Arrangements: Spouse/significant other Available Help at Discharge: Family;Available 24 hours/day   Home Access: Stairs to enter [employer] of Steps: 3 Entrance Stairs-Rails: Can reach both;Left;Right Home Layout: One level     Bathroom Shower/Tub: Occupational psychologist: Standard Bathroom Accessibility: Yes   Home Equipment: Walker - 2 wheels   Additional Comments: Pt pleasently confused. Unable to provide much information on PLOF or home set-up. Endorsed having RW.       Prior Functioning/Environment Level of Independence: Needs assistance  Gait / Transfers Assistance Needed: Endorses using RW for ambulation. States she does not leave the house much d/t fatigue. ADL's / Homemaking Assistance Needed: Pt endorses being independent with bathing, dressing, and medication mgt. Endorses husband assists with all IADLs including cleaning, cooking, shopping, and driving.    Comments: Patient unable to provide clear information due to state of confusion.        OT  Problem List: Decreased strength;Impaired balance (sitting and/or standing);Decreased cognition;Pain;Decreased range of motion;Decreased safety awareness;Decreased activity tolerance;Decreased coordination;Decreased knowledge of use of DME or AE      OT Treatment/Interventions: Self-care/ADL training    OT Goals(Current goals can be found in the care plan section) Acute Rehab OT  Goals Patient Stated Goal: To go home OT Goal Formulation: With patient Time For Goal Achievement: 12/23/18 Potential to Achieve Goals: Good ADL Goals Pt Will Perform Grooming: sitting;with adaptive equipment;with set-up(With LRAD for safety and improved functional independence.) Pt Will Perform Lower Body Bathing: with adaptive equipment;sit to/from stand;with supervision(With LRAD for safety and improved functional independence.) Pt Will Perform Lower Body Dressing: with supervision;with adaptive equipment;sit to/from stand(With LRAD for safety and improved functional independence.) Pt Will Transfer to Toilet: with set-up;ambulating;bedside commode(With LRAD for safety and improved functional independence.)  OT Frequency: Min 1X/week   Barriers to D/C:            Co-evaluation              AM-PAC OT "6 Clicks" Daily Activity     Outcome Measure Help from another person eating meals?: None Help from another person taking care of personal grooming?: A Little Help from another person toileting, which includes using toliet, bedpan, or urinal?: A Lot Help from another person bathing (including washing, rinsing, drying)?: A Lot Help from another person to put on and taking off regular upper body clothing?: A Little Help from another person to put on and taking off regular lower body clothing?: A Lot 6 Click Score: 16   End of Session Equipment Utilized During Treatment: Gait belt;Rolling walker Nurse Communication: Other (comment)(Pt looking for cell phone at end of session, unable to find in room. RN notified. )  Activity Tolerance: Patient tolerated treatment well Patient left: in bed;with call bell/phone within reach;with bed alarm set;with SCD's reapplied  OT Visit Diagnosis: Other abnormalities of gait and mobility (R26.89);Muscle weakness (generalized) (M62.81);Pain Pain - Right/Left: Right Pain - part of body: Hip                Time: 1440-1515 OT Time Calculation  (min): 35 min Charges:  OT General Charges $OT Visit: 1 Visit OT Evaluation $OT Eval Low Complexity: 1 Low OT Treatments $Self Care/Home Management : 23-37 mins  Shara Blazing, M.S., OTR/L Ascom: [phone] 12/09/18, 4:00 PM

## 2018-12-09 NOTE — Progress Notes (Signed)
[place] at [place] NAME: Erin Daniels    MR#:  [id number]  DATE OF BIRTH:  [birth year]  SUBJECTIVE:   Here after a mechanical fall noted to have a right hip fracture.  S/p hip intramedullary fixation postop day #2 today.  More awake and alert today.  Complaining of some right hip pain.    REVIEW OF SYSTEMS:    Review of Systems  Unable to perform ROS: Dementia    Nutrition: Regular Tolerating Diet: yes Tolerating PT: eval noted.   DRUG ALLERGIES:   Allergies  Allergen Reactions  . Codeine Nausea Only and Other (See Comments)    Reaction: stress attacks/ headache Can take Vicodin and Percocet   . Meperidine Nausea Only and Other (See Comments)    Reaction: stress attacks/ headache  . Metoclopramide Hcl Nausea Only and Other (See Comments)    Reaction: stress attacks/ headache  . Oxycodone Nausea Only and Other (See Comments)    Reaction: stress attacks/ headache  . Stadol [Butorphanol] Nausea Only and Other (See Comments)    Reaction: stress attacks/ headache  . Tussionex Pennkinetic Er [Hydrocod Polst-Cpm Polst Er] Other (See Comments)    Filled patient with sugar   . Carbocaine  [Mepivacaine Hcl] Palpitations    VITALS:  Blood pressure (!) 141/56, pulse 89, temperature 99.3 F (37.4 C), resp. rate 16, weight 61.7 kg, SpO2 100 %.  PHYSICAL EXAMINATION:   Physical Exam  GENERAL:  73 y.o.-year-old patient lying in bed in NAD.  EYES: Pupils equal, round, reactive to light and accommodation. No scleral icterus. Extraocular muscles intact.  HEENT: Head atraumatic, normocephalic. Oropharynx and nasopharynx clear.  NECK:  Supple, no jugular venous distention. No thyroid enlargement, no tenderness.  LUNGS: Normal breath sounds bilaterally, no wheezing, rales, rhonchi. No use of accessory muscles of respiration.  CARDIOVASCULAR: S1, S2 normal. No murmurs, rubs, or gallops.  ABDOMEN: Soft, nontender, nondistended. Bowel  sounds present. No organomegaly or mass.  EXTREMITIES: No cyanosis, clubbing or edema b/l.  Right hip dressing in place from surgery yesterday NEUROLOGIC: Cranial nerves II through XII are intact. No focal Motor or sensory deficits b/l. Globally weak.   PSYCHIATRIC: The patient is alert and oriented x 3.  SKIN: No obvious rash, lesion, or ulcer.    LABORATORY PANEL:   CBC Recent Labs  Lab 12/09/18 0447  WBC 8.2  HGB 7.9*  HCT 24.6*  PLT 166   ------------------------------------------------------------------------------------------------------------------  Chemistries  Recent Labs  Lab 12/09/18 0447  NA 139  K 4.6  CL 108  CO2 20*  GLUCOSE 335*  BUN 24*  CREATININE 0.54  CALCIUM 8.3*   ------------------------------------------------------------------------------------------------------------------  Cardiac Enzymes No results for input(s): TROPONINI in the last 168 hours. ------------------------------------------------------------------------------------------------------------------  RADIOLOGY:  Dg Hip Port Unilat With Pelvis 1v Right  Result Date: 12/07/2018 CLINICAL DATA:  Status post right hip replacement. EXAM: DG HIP (WITH OR WITHOUT PELVIS) 1V PORT RIGHT COMPARISON:  None. FINDINGS: A gamma nail and intramedullary rod cross the intertrochanteric fracture of the right hip. A distal interlocking screw is noted. Hardware is in good position. Skin staples and postoperative soft tissue air noted. IMPRESSION: Right hip repair as above.  Hardware is in good position. Electronically Signed   By: Dorise Bullion III M.D   On: 12/07/2018 16:19   Dg Hip Operative Unilat W Or W/o Pelvis Right  Result Date: 12/07/2018 CLINICAL DATA:  Right hip fracture repair. FLUOROSCOPY TIME:  1 minutes 29 seconds.  Images: 6 EXAM: OPERATIVE RIGHT HIP (WITH PELVIS IF PERFORMED) 6 VIEWS TECHNIQUE: Fluoroscopic spot image(s) were submitted for interpretation post-operatively. COMPARISON:   None. FINDINGS: By the end of the study, a gamma nail and intramedullary rod have been placed across the right hip fracture with a distal interlocking screw. IMPRESSION: Right hip fracture repair as above. Electronically Signed   By: Dorise Bullion III M.D   On: 12/07/2018 16:20     ASSESSMENT AND PLAN:   73 year old female with past medical history of diabetes, dementia, history of previous CVA, seizures, hypertension, hyperlipidemia, history of gastroparesis, diabetic neuropathy who presents to the hospital after a mechanical fall noted to have right hip fracture.  1.  Status post fall and right hip fracture-Status post right hip intramedullary pinning postop day #2 today.  Continue further care as per orthopedics. Pain is well controlled on oral oxycodone.  Seen by physical therapy and likely will need short-term rehab. -Continue Coumadin for DVT prophylaxis.  Appreciate orthopedics input.   2.  Diabetes type 2 without complication- blood sugars are somewhat labile.  Appreciate diabetes coordinator input. - Started on some Lantus, NovoLog with meals.  Continue carb controlled diet.  Follow blood sugars.  3.  Essential hypertension-continue losartan, hydralazine as needed.  4.  Glaucoma-continue latanoprost-Timoptic/brimonidine Eyedrops.  5.  Hyperlipidemia-continue atorvastatin.  6.  Depression-continue Effexor.  7.  History of previous CVA- cont. Coumadin.   All the records are reviewed and case discussed with Care Management/Social Worker. Management plans discussed with the patient, family and they are in agreement.  CODE STATUS: Full code  DVT Prophylaxis: coumadin  TOTAL TIME TAKING CARE OF THIS PATIENT: 30 minutes.   POSSIBLE D/C IN 1-2 DAYS, DEPENDING ON CLINICAL CONDITION.   Henreitta Leber M.D on 12/09/2018 at 2:07 PM  Between 7am to 6pm - Pager - [phone]  After 6pm go to [email or web address] - Proofreader  [facility]  Office   [phone]  CC: Primary care physician; Juluis Pitch, MD

## 2018-12-09 NOTE — Progress Notes (Signed)
Subjective:  POD #2 s/p medullary fixation for right intertrochanteric hip fracture.   Patient reports right hip pain as mild.  She has restarted her Coumadin.  Patient is up out of bed to a chair and is more alert today.  Objective:   VITALS:   Vitals:   12/08/18 1225 12/08/18 1610 12/08/18 1936 12/09/18 0506  BP: (!) 136/57 (!) 131/59 (!) 129/54 (!) 141/56  Pulse: 100 97 (!) 105 89  Resp:   19 16  Temp: 98.6 F (37 C) 98 F (36.7 C) 98.5 F (36.9 C) 99.3 F (37.4 C)  TempSrc: Oral Oral    SpO2: 100% 99% 95% 100%  Weight:        PHYSICAL EXAM: Right lower extremity Neurovascular intact Sensation intact distally Intact pulses distally Dorsiflexion/Plantar flexion intact Incision: dressing C/D/I No cellulitis present Compartment soft  LABS  Results for orders placed or performed during the hospital encounter of 12/06/18 (from the past 24 hour(s))  Glucose, capillary     Status: Abnormal   Collection Time: 12/08/18  4:40 PM  Result Value Ref Range   Glucose-Capillary 313 (H) 70 - 99 mg/dL   Comment 1 Notify RN   Glucose, capillary     Status: Abnormal   Collection Time: 12/08/18  8:05 PM  Result Value Ref Range   Glucose-Capillary 234 (H) 70 - 99 mg/dL   Comment 1 Notify RN   CBC     Status: Abnormal   Collection Time: 12/09/18  4:47 AM  Result Value Ref Range   WBC 8.2 4.0 - 10.5 K/uL   RBC 2.60 (L) 3.87 - 5.11 MIL/uL   Hemoglobin 7.9 (L) 12.0 - 15.0 g/dL   HCT 24.6 (L) 36.0 - 46.0 %   MCV 94.6 80.0 - 100.0 fL   MCH 30.4 26.0 - 34.0 pg   MCHC 32.1 30.0 - 36.0 g/dL   RDW 12.8 11.5 - 15.5 %   Platelets 166 150 - 400 K/uL   nRBC 0.0 0.0 - 0.2 %  Basic metabolic panel     Status: Abnormal   Collection Time: 12/09/18  4:47 AM  Result Value Ref Range   Sodium 139 135 - 145 mmol/L   Potassium 4.6 3.5 - 5.1 mmol/L   Chloride 108 98 - 111 mmol/L   CO2 20 (L) 22 - 32 mmol/L   Glucose, Bld 335 (H) 70 - 99 mg/dL   BUN 24 (H) 8 - 23 mg/dL   Creatinine, Ser 0.54  0.44 - 1.00 mg/dL   Calcium 8.3 (L) 8.9 - 10.3 mg/dL   GFR calc non Af Amer >60 >60 mL/min   GFR calc Af Amer >60 >60 mL/min   Anion gap 11 5 - 15  Protime-INR     Status: Abnormal   Collection Time: 12/09/18  4:47 AM  Result Value Ref Range   Prothrombin Time 16.2 (H) 11.4 - 15.2 seconds   INR 1.3 (H) 0.8 - 1.2  Glucose, capillary     Status: Abnormal   Collection Time: 12/09/18  7:51 AM  Result Value Ref Range   Glucose-Capillary 404 (H) 70 - 99 mg/dL  Glucose, capillary     Status: Abnormal   Collection Time: 12/09/18 11:56 AM  Result Value Ref Range   Glucose-Capillary 211 (H) 70 - 99 mg/dL    Dg Hip Port Unilat With Pelvis 1v Right  Result Date: 12/07/2018 CLINICAL DATA:  Status post right hip replacement. EXAM: DG HIP (WITH OR WITHOUT PELVIS) 1V PORT RIGHT  COMPARISON:  None. FINDINGS: A gamma nail and intramedullary rod cross the intertrochanteric fracture of the right hip. A distal interlocking screw is noted. Hardware is in good position. Skin staples and postoperative soft tissue air noted. IMPRESSION: Right hip repair as above.  Hardware is in good position. Electronically Signed   By: Dorise Bullion III M.D   On: 12/07/2018 16:19   Dg Hip Operative Unilat W Or W/o Pelvis Right  Result Date: 12/07/2018 CLINICAL DATA:  Right hip fracture repair. FLUOROSCOPY TIME:  1 minutes 29 seconds. Images: 6 EXAM: OPERATIVE RIGHT HIP (WITH PELVIS IF PERFORMED) 6 VIEWS TECHNIQUE: Fluoroscopic spot image(s) were submitted for interpretation post-operatively. COMPARISON:  None. FINDINGS: By the end of the study, a gamma nail and intramedullary rod have been placed across the right hip fracture with a distal interlocking screw. IMPRESSION: Right hip fracture repair as above. Electronically Signed   By: Dorise Bullion III M.D   On: 12/07/2018 16:20    Assessment/Plan: 2 Days Post-Op   Active Problems:   Intertrochanteric fracture of right hip [facility])  Patient doing well from orthopedic  standpoint.  She is on Coumadin chronically which will cover her for DVT prophylaxis.  Continue physical therapy as the patient can tolerate.  Patient will follow-up in my office in 2 weeks following discharge from the hospital.  She would benefit from placement in a skilled nursing facility following discharge from the hospital.    Thornton Park , MD 12/09/2018, 1:45 PM

## 2018-12-09 NOTE — Progress Notes (Signed)
Physical Therapy Treatment Patient Details Name: Erin Daniels MRN: [id number] DOB: 1946/08/14 Today's Date: 12/09/2018    History of Present Illness Pt is a 73 yo female patient with history of osteoporosis, herniated disks in the cervical area, hypertension, hypothyroidism, type 1 diabetes mellitus on insulin pump, CVA in the past currently under hospitalist service for lower lumbar sacral pain.  Assessment includes: R hip fracture with internal fixation.    PT Comments    Pt agreeable to PT session and reporting minimal pain sitting in recliner.  Able to stand with min to mod assist x1 and then walk 24 feet with RW with CGA to min assist x1.  Pt incontinent of darker bowel again when taking steps so brief donned for ambulation (OT present end of session and reported she would remove brief end of her session; nurse notified); antalgic gait noted.  Pt appearing more oriented this afternoon (except not to month) but appearing with general confusion and required cueing for tasks (pt did better with simple one step commands than multi-step commands).  Will continue to progress pt with strengthening and progressive ambulation next session.   Follow Up Recommendations  SNF     Equipment Recommendations  Rolling walker with 5" wheels;3in1 (PT)    Recommendations for Other Services OT consult     Precautions / Restrictions Precautions Precautions: Fall Restrictions Weight Bearing Restrictions: Yes RLE Weight Bearing: Weight bearing as tolerated    Mobility  Bed Mobility         General bed mobility comments: Deferred (pt sitting on edge of bed end of session with OT)  Transfers Overall transfer level: Needs assistance Equipment used: Rolling walker (2 wheeled) Transfers: Sit to/from Stand Sit to Stand: Min assist;Mod assist         General transfer comment: assist to initiate and come to full stand; vc's for UE/LE placement  Ambulation/Gait Ambulation/Gait assistance:  Min guard;Min assist;+2 safety/equipment Gait Distance (Feet): 24 Feet Assistive device: Rolling walker (2 wheeled)   Gait velocity: decreased   General Gait Details: antalgic; decreased stance time R LE; vc's for walker use and to increase UE support through RW; decreased B LE step length   Stairs             Wheelchair Mobility    Modified Rankin (Stroke Patients Only)       Balance Overall balance assessment: History of Falls;Needs assistance Sitting-balance support: Bilateral upper extremity supported;Feet supported Sitting balance-Leahy Scale: Fair Sitting balance - Comments: steady static sitting   Standing balance support: Bilateral upper extremity supported Standing balance-Leahy Scale: Poor Standing balance comment: pt requiring B UE support on RW for static standing balance                            Cognition Arousal/Alertness: Awake/alert Behavior During Therapy: WFL for tasks assessed/performed Overall Cognitive Status: No family/caregiver present to determine baseline cognitive functioning                                 General Comments: Oriented to person, place, situation, and year (not month).      Exercises     General Comments   Nursing cleared pt for participation in physical therapy and to trial pt on room air.  Pt agreeable to PT session.      Pertinent Vitals/Pain Pain Assessment: Faces Faces Pain Scale: Hurts a little  bit(2/10 at rest; 6/10 with activity) Pain Location: R hip Pain Descriptors / Indicators: Sore;Tender;Grimacing;Guarding Pain Intervention(s): Limited activity within patient's tolerance;Monitored during session;Premedicated before session;Repositioned    Home Living                      Prior Function            PT Goals (current goals can now be found in the care plan section) Acute Rehab PT Goals PT Goal Formulation: With patient Progress towards PT goals: Progressing toward  goals    Frequency    BID      PT Plan Current plan remains appropriate    Co-evaluation              AM-PAC PT "6 Clicks" Mobility   Outcome Measure  Help needed turning from your back to your side while in a flat bed without using bedrails?: A Lot Help needed moving from lying on your back to sitting on the side of a flat bed without using bedrails?: A Lot Help needed moving to and from a bed to a chair (including a wheelchair)?: A Lot Help needed standing up from a chair using your arms (e.g., wheelchair or bedside chair)?: A Lot Help needed to walk in hospital room?: A Little Help needed climbing 3-5 steps with a railing? : Total 6 Click Score: 12    End of Session Equipment Utilized During Treatment: Gait belt;Oxygen Activity Tolerance: Patient tolerated treatment well Patient left: (sitting on edge of bed with OT present.) Nurse Communication: Mobility status;Precautions;Weight bearing status;Other (comment) PT Visit Diagnosis: Unsteadiness on feet (R26.81);History of falling (Z91.81);Muscle weakness (generalized) (M62.81);Difficulty in walking, not elsewhere classified (R26.2);Pain Pain - Right/Left: Right Pain - part of body: Hip     Time: 1417-1440 PT Time Calculation (min) (ACUTE ONLY): 23 min  Charges:  $Gait Training: 8-22 mins $Therapeutic Activity: 8-22 mins                    Leitha Bleak, PT 12/09/18, 3:27 PM [phone]

## 2018-12-09 NOTE — Progress Notes (Signed)
Inpatient Diabetes Program Recommendations  AACE/ADA: New Consensus Statement on Inpatient Glycemic Control   Target Ranges:  Prepandial:   less than 140 mg/dL      Peak postprandial:   less than 180 mg/dL (1-2 hours)      Critically ill patients:  140 - 180 mg/dL   Results for Erin Daniels, Erin Daniels (MRN [id number]) as of 12/09/2018 07:57  Ref. Range 12/08/2018 07:50 12/08/2018 12:04 12/08/2018 16:40 12/08/2018 20:05 12/09/2018 07:51  Glucose-Capillary Latest Ref Range: 70 - 99 mg/dL 365 (H) 266 (H) 313 (H) 234 (H) 404 (H)   Review of Glycemic Control  Diabetes history: DM1 (makes NO insulin; requires basal, correction, and carbohydrate coverage insulin; dx in 1968) Outpatient Diabetes medications: Novolog 0-15 units TID with meals, Lantus 12 units QHS Current orders for Inpatient glycemic control: Novolog 0-15 units TID with meals, Novolog 0-5 units QHS  Inpatient Diabetes Program Recommendations:   Insulin - Basal: Please consider ordering Lantus 10 units Q24H starting now.  Correction (SSI): Please consider decreasing Novolog correction to Novolog 0-9 units TID with meals and continue Novolog 0-5 units QHS.  Insulin - Meal Coverage: Please consider ordering Novolog 3 units TID with meals for meal coverage if patient eats at least 50% of meals.  NOTE: In reviewing chart, noted patient has Type 1 DM dx in 1968 and patient is followed by Dr. Frederik Pear with [facility]. Patient use to be on an insulin pump in the past. Since patient has DM1, she will need basal, correction, and meal coverage insulin ordered as she makes NO insulin at all. Fasting glucose 404 mg/dl this morning.   Thanks, Barnie Alderman, RN, MSN, CDE Diabetes Coordinator Inpatient Diabetes Program [phone] (Team Pager from 8am to 5pm)

## 2018-12-09 NOTE — Plan of Care (Signed)
Dr. Text to inform of CBG 404.  Will give 15U max Novolog on sliding scale.  No further orders at this time. Will assess further meds/needs on rounds.

## 2018-12-09 NOTE — Care Management Important Message (Signed)
Important Message  Patient Details  Name: Erin Daniels MRN: [id number] Date of Birth: 1946-06-21   Medicare Important Message Given:  Yes    Erin Daniels 12/09/2018, 12:04 PM

## 2018-12-10 ENCOUNTER — Other Ambulatory Visit: Payer: Self-pay

## 2018-12-10 LAB — GLUCOSE, CAPILLARY
Glucose-Capillary: 137 mg/dL — ABNORMAL HIGH (ref 70–99)
Glucose-Capillary: 228 mg/dL — ABNORMAL HIGH (ref 70–99)

## 2018-12-10 LAB — PROTIME-INR
INR: 2.3 — ABNORMAL HIGH (ref 0.8–1.2)
Prothrombin Time: 24.9 seconds — ABNORMAL HIGH (ref 11.4–15.2)

## 2018-12-10 LAB — CBC
HCT: 24.5 % — ABNORMAL LOW (ref 36.0–46.0)
Hemoglobin: 8 g/dL — ABNORMAL LOW (ref 12.0–15.0)
MCH: 30.9 pg (ref 26.0–34.0)
MCHC: 32.7 g/dL (ref 30.0–36.0)
MCV: 94.6 fL (ref 80.0–100.0)
Platelets: 183 10*3/uL (ref 150–400)
RBC: 2.59 MIL/uL — ABNORMAL LOW (ref 3.87–5.11)
RDW: 13 % (ref 11.5–15.5)
WBC: 8 10*3/uL (ref 4.0–10.5)
nRBC: 0 % (ref 0.0–0.2)

## 2018-12-10 MED ORDER — INSULIN GLARGINE 100 UNIT/ML SOLOSTAR PEN: 10.0000 [IU] | PEN_INJECTOR | Freq: Every day | SUBCUTANEOUS | 0 refills | Status: DC

## 2018-12-10 MED ORDER — WARFARIN SODIUM 5 MG PO TABS
5.0000 mg | ORAL_TABLET | Freq: Once | ORAL | Status: DC
  Filled 2018-12-10: qty 1

## 2018-12-10 MED ORDER — HYDROCODONE-ACETAMINOPHEN 5-325 MG PO TABS: 1.0000 | ORAL_TABLET | ORAL | 0 refills | Status: DC | PRN

## 2018-12-10 NOTE — Progress Notes (Signed)
ANTICOAGULATION CONSULT NOTE - Initial Consult  Pharmacy Consult for  Warfarin  Indication: stroke prophylaxis  Allergies  Allergen Reactions  . Codeine Nausea Only and Other (See Comments)    Reaction: stress attacks/ headache Can take Vicodin and Percocet   . Meperidine Nausea Only and Other (See Comments)    Reaction: stress attacks/ headache  . Metoclopramide Hcl Nausea Only and Other (See Comments)    Reaction: stress attacks/ headache  . Oxycodone Nausea Only and Other (See Comments)    Reaction: stress attacks/ headache  . Stadol [Butorphanol] Nausea Only and Other (See Comments)    Reaction: stress attacks/ headache  . Tussionex Pennkinetic Er [Hydrocod Polst-Cpm Polst Er] Other (See Comments)    Filled patient with sugar   . Carbocaine  [Mepivacaine Hcl] Palpitations    Patient Measurements: Weight: 136 lb (61.7 kg) Heparin Dosing Weight:   Vital Signs: Temp: 98.6 F (37 C) (04/21 0352) Temp Source: Oral (04/21 0352) BP: 149/83 (04/21 0352) Pulse Rate: 105 (04/21 0352)  Labs: Recent Labs    12/08/18 0607 12/09/18 0447 12/10/18 0526  HGB 8.8* 7.9* 8.0*  HCT 27.0* 24.6* 24.5*  PLT 159 166 183  LABPROT 14.5 16.2* 24.9*  INR 1.1 1.3* 2.3*  CREATININE 0.66 0.54  --     Estimated Creatinine Clearance: 53.2 mL/min (by C-G formula based on SCr of 0.54 mg/dL).   Medical History: Past Medical History:  Diagnosis Date  . At high risk for falls   . Basal cell carcinoma    right upper leg  . Complication of anesthesia    difficult to arouse  . Diabetes mellitus without complication (HCC)    Type I  . Diabetic neuropathy ([place])   . Diabetic retinopathy ([place])   . Dizziness   . Gastroparesis   . Herniated disc, cervical   . Hypercholesteremia   . Hypertension   . Hypotensive episode   . Hypothyroidism   . Insulin pump in place   . Myofascial muscle pain   . Numbness of arm    left  . Osteoporosis   . Poor vision   . Seizures ([place])   . Slurred  speech   . Stroke (cerebrum) ([place])    4/19  . Stroke (HCC)    x5  . Vertigo   . Wears glasses     Medications:  Medications Prior to Admission  Medication Sig Dispense Refill Last Dose  . aspirin EC 81 MG tablet Take 81 mg by mouth daily.   12/06/2018 at 0800  . atorvastatin (LIPITOR) 40 MG tablet Take 1 tablet (40 mg total) by mouth daily. 30 tablet 0 12/05/2018 at 1800  . brimonidine-timolol (COMBIGAN) 0.2-0.5 % ophthalmic solution Place 1 drop into both eyes 3 (three) times daily. Instill one drop in right eye morning, noon and bedtime   12/06/2018 at 1300  . Calcium Carb-Cholecalciferol (CALCIUM 600 + D) 600-200 MG-UNIT TABS Take 1 tablet by mouth every evening.   12/06/2018 at 0800  . Cholecalciferol (VITAMIN D-3) 5000 units TABS Take 5,000 Units by mouth daily.   12/06/2018 at 0800  . ferrous sulfate 325 (65 FE) MG tablet Take 325 mg by mouth daily with breakfast.   12/06/2018 at 0800  . gabapentin (NEURONTIN) 300 MG capsule 300 mg in the morning, 300 mg in the afternoon, 900 mg in the evening 150 capsule 0 12/06/2018 at 1200  . Garlic 3818 MG CAPS Take 1 capsule by mouth daily.    12/06/2018 at 0800  .  losartan (COZAAR) 50 MG tablet Take 1 tablet (50 mg total) by mouth daily for 30 days. 30 tablet 0 12/06/2018 at 0800  . Multiple Vitamins-Minerals (CEROVITE ADVANCED FORMULA PO) Take 1 tablet by mouth daily.   12/06/2018 at 0800  . Netarsudil-Latanoprost (ROCKLATAN) 0.02-0.005 % SOLN Apply 1 drop to eye at bedtime. Instill one drop in right eye at bedtime   12/05/2018 at 2000  . promethazine (PHENERGAN) 25 MG suppository Place 25 mg rectally every 6 (six) hours as needed for nausea or vomiting.   prn at prn  . venlafaxine (EFFEXOR) 75 MG tablet Take 1 tablet (75 mg total) by mouth 2 (two) times daily. 60 tablet 0 12/06/2018 at 0800  . vitamin C (ASCORBIC ACID) 500 MG tablet Take 500 mg by mouth 2 (two) times daily.   12/06/2018 at 0800  . warfarin (COUMADIN) 5 MG tablet Take 1 tablet (5 mg total)  by mouth See admin instructions. Managed by Dr. Merian Capron, Mon, Wed, Fri, Sat (Patient taking differently: Take 7.5 mg by mouth See admin instructions. Take one and one-half tablets (7.5 mg) by mouth daily. Managed by Dr. Ouida Sills) 10 tablet 0 12/05/2018 at 1800  . acetaminophen (TYLENOL) 325 MG tablet Take 2 tablets (650 mg total) by mouth every 6 (six) hours as needed for headache. For mild pain 30 tablet 0 prn at prn  . ACETAMINOPHEN-BUTALBITAL 50-325 MG TABS Take 1 tablet by mouth 2 (two) times daily as needed (headache). (Patient not taking: Reported on 12/06/2018) 180 each 1 Not Taking at prn  . butalbital-acetaminophen-caffeine (FIORICET) 50-325-40 MG tablet Take 1 tablet by mouth 2 (two) times daily.   prn at prn  . carboxymethylcellulose (REFRESH PLUS) 0.5 % SOLN Place 1 drop into both eyes as needed (for dry eyes). 15 mL 0 prn at prn  . cetirizine (ZYRTEC) 10 MG tablet Take 1 tablet (10 mg total) by mouth daily. 30 tablet 0 prn at prn  . glucose 4 GM chewable tablet Chew 1 tablet (4 g total) by mouth as needed for low blood sugar. <65 50 tablet 0 prn at prn  . insulin aspart (NOVOLOG FLEXPEN) 100 UNIT/ML FlexPen Inject 0-15 Units into the skin 3 (three) times daily with meals. Inject 0-15 units subcutaneous per sliding scale before meals and at bedtime 0 - 100 units = 0 units 101 - 120 = 1 unit 121 - 150 = 2 units 151 - 200 = 4 units 201 - 250 = 6 units 251 - 300 = 8 units 301 - 350 = 10 units 351 - 400 = 12 units 401 - 450 = 15 units > 450 call MD 15 mL 0   . Insulin Glargine (LANTUS SOLOSTAR) 100 UNIT/ML Solostar Pen Inject 12 Units into the skin at bedtime. (Patient not taking: Reported on 12/06/2018) 15 mL 0 Not Taking at Unknown time  . linaclotide (LINZESS) 72 MCG capsule Take 1 capsule (72 mcg total) by mouth daily for 30 days. 30 capsule 0   . NON FORMULARY Diet type:  NCS   Taking  . polyethylene glycol (MIRALAX / GLYCOLAX) packet Take 17 g by mouth daily.    prn at prn  .  senna (SENOKOT) 8.6 MG TABS tablet Take 2 tablets by mouth 2 (two) times daily.   prn at prn  . warfarin (COUMADIN) 6 MG tablet Take 1 tablet (6 mg total) by mouth daily. Once A Day on Tue, Thu (Patient not taking: Reported on 12/06/2018) 4 tablet 0 Not Taking  at Unknown time    Assessment: Pharmacy consulted to dose warfarin for stroke prophylaxis in this 73 year old female admitted for surgical repair of right radial fracture.   4/17:  INR = 2.0 4/18:  INR = 1.5  4/19:  INR = 1.1 4/20:  INR = 1.3 4/21:  INR = 2.3 therapeutic  Pt was on warfarin 7.5 mg PO daily at home.  Goal of Therapy:  INR 2-3  Plan:  Patient was given a 10mg  dose yesterday. Will give 5 mg PO dose today and recheck INR with AM labs.   Pearla Dubonnet, PharmD Clinical Pharmacist 12/10/2018 7:20 AM

## 2018-12-10 NOTE — Discharge Summary (Signed)
[facility] [place] NAME: Erin Daniels    MR#:  [id number]  DATE OF BIRTH:  03/03/1946  DATE OF ADMISSION:  12/06/2018 ADMITTING PHYSICIAN: Sela Hua, MD  DATE OF DISCHARGE: 12/10/2018   PRIMARY CARE PHYSICIAN: Juluis Pitch, MD    ADMISSION DIAGNOSIS:  Fall, initial encounter [phone].[id number]] Closed fracture of right hip, initial encounter ([place]) [S72.001A]  DISCHARGE DIAGNOSIS:  Active Problems:   Intertrochanteric fracture of right hip ([place])   SECONDARY DIAGNOSIS:   Past Medical History:  Diagnosis Date  . At high risk for falls   . Basal cell carcinoma    right upper leg  . Complication of anesthesia    difficult to arouse  . Diabetes mellitus without complication (HCC)    Type I  . Diabetic neuropathy ([place])   . Diabetic retinopathy ([place])   . Dizziness   . Gastroparesis   . Herniated disc, cervical   . Hypercholesteremia   . Hypertension   . Hypotensive episode   . Hypothyroidism   . Insulin pump in place   . Myofascial muscle pain   . Numbness of arm    left  . Osteoporosis   . Poor vision   . Seizures ([place])   . Slurred speech   . 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 Stroke (HCC)    x5  . Vertigo   . Wears glasses     HOSPITAL COURSE:   73 year old female with past medical history of diabetes, dementia, history of previous CVA, seizures, hypertension, hyperlipidemia, history of gastroparesis, diabetic neuropathy who presents to the hospital after a mechanical fall noted to have right hip fracture.  1.  Status post fall and right hip fracture- admitted to the hospital and seen by orthopedics and is now status post right hip intramedullary pinning postop day #3 today.   -Patient's pain is controlled with oral Norco.  She is tolerating physical therapy well.  She will be discharged to a skilled nursing facility for ongoing rehab.  Follow-up with orthopedics in the next 1 to 2 weeks. - Continue Coumadin for  DVT prophylaxis.  2.  Diabetes type 2 without complication-  blood sugars were somewhat labile and therefore diabetes coordinator consult was obtained in the hospital.  Patient's insulin has been adjusted.  Blood sugars are now stable.  She will be discharged on Lantus and her NovoLog sliding scale.  3.  Essential hypertension- she will cont. Her Losartan.   4.  Glaucoma-continue latanoprost, Timoptic/brimonidine Eyedrops.  5.  Hyperlipidemia- she will continue atorvastatin.  6.  Depression- she will continue Effexor.  7.  History of previous CVA- she will cont. Coumadin.   DISCHARGE CONDITIONS:   Stable.   CONSULTS OBTAINED:  Treatment Team:  Thornton Park, MD  DRUG ALLERGIES:   Allergies  Allergen Reactions  . Codeine Nausea Only and Other (See Comments)    Reaction: stress attacks/ headache Can take Vicodin and Percocet   . Meperidine Nausea Only and Other (See Comments)    Reaction: stress attacks/ headache  . Metoclopramide Hcl Nausea Only and Other (See Comments)    Reaction: stress attacks/ headache  . Oxycodone Nausea Only and Other (See Comments)    Reaction: stress attacks/ headache  . Stadol [Butorphanol] Nausea Only and Other (See Comments)    Reaction: stress attacks/ headache  . Tussionex Pennkinetic Er [Hydrocod Polst-Cpm Polst Er] Other (See Comments)    Filled patient with sugar   . Carbocaine  [  Mepivacaine Hcl] Palpitations    DISCHARGE MEDICATIONS:   Allergies as of 12/10/2018      Reactions   Codeine Nausea Only, Other (See Comments)   Reaction: stress attacks/ headache Can take Vicodin and Percocet    Meperidine Nausea Only, Other (See Comments)   Reaction: stress attacks/ headache   Metoclopramide Hcl Nausea Only, Other (See Comments)   Reaction: stress attacks/ headache   Oxycodone Nausea Only, Other (See Comments)   Reaction: stress attacks/ headache   Stadol [butorphanol] Nausea Only, Other (See Comments)   Reaction: stress  attacks/ headache   Tussionex Pennkinetic Er [hydrocod Polst-cpm Polst Er] Other (See Comments)   Filled patient with sugar    Carbocaine  [mepivacaine Hcl] Palpitations      Medication List    STOP taking these medications   ACETAMINOPHEN-BUTALBITAL 50-325 MG Tabs   linaclotide 72 MCG capsule Commonly known as:  Linzess     TAKE these medications   acetaminophen 325 MG tablet Commonly known as:  TYLENOL Take 2 tablets (650 mg total) by mouth every 6 (six) hours as needed for headache. For mild pain   aspirin EC 81 MG tablet Take 81 mg by mouth daily.   atorvastatin 40 MG tablet Commonly known as:  LIPITOR Take 1 tablet (40 mg total) by mouth daily.   butalbital-acetaminophen-caffeine 50-325-40 MG tablet Commonly known as:  FIORICET Take 1 tablet by mouth 2 (two) times daily.   Calcium 600 + D 600-200 MG-UNIT Tabs Generic drug:  Calcium Carb-Cholecalciferol Take 1 tablet by mouth every evening.   carboxymethylcellulose 0.5 % Soln Commonly known as:  REFRESH PLUS Place 1 drop into both eyes as needed (for dry eyes).   CEROVITE ADVANCED FORMULA PO Take 1 tablet by mouth daily.   cetirizine 10 MG tablet Commonly known as:  ZYRTEC Take 1 tablet (10 mg total) by mouth daily.   Combigan 0.2-0.5 % ophthalmic solution Generic drug:  brimonidine-timolol Place 1 drop into both eyes 3 (three) times daily. Instill one drop in right eye morning, noon and bedtime   ferrous sulfate 325 (65 FE) MG tablet Take 325 mg by mouth daily with breakfast.   gabapentin 300 MG capsule Commonly known as:  NEURONTIN 300 mg in the morning, 300 mg in the afternoon, 900 mg in the evening   Garlic 9485 MG Caps Take 1 capsule by mouth daily.   glucose 4 GM chewable tablet Chew 1 tablet (4 g total) by mouth as needed for low blood sugar. <65   HYDROcodone-acetaminophen 5-325 MG tablet Commonly known as:  NORCO/VICODIN Take 1-2 tablets by mouth every 4 (four) hours as needed for moderate  pain (pain score 4-6).   insulin aspart 100 UNIT/ML FlexPen Commonly known as:  NovoLOG FlexPen Inject 0-15 Units into the skin 3 (three) times daily with meals. Inject 0-15 units subcutaneous per sliding scale before meals and at bedtime 0 - 100 units = 0 units 101 - 120 = 1 unit 121 - 150 = 2 units 151 - 200 = 4 units 201 - 250 = 6 units 251 - 300 = 8 units 301 - 350 = 10 units 351 - 400 = 12 units 401 - 450 = 15 units > 450 call MD   Insulin Glargine 100 UNIT/ML Solostar Pen Commonly known as:  Lantus SoloStar Inject 10 Units into the skin at bedtime. What changed:  how much to take   losartan 50 MG tablet Commonly known as:  COZAAR Take 1 tablet (50 mg  total) by mouth daily for 30 days.   NON FORMULARY Diet type:  NCS   polyethylene glycol 17 g packet Commonly known as:  MIRALAX / GLYCOLAX Take 17 g by mouth daily.   promethazine 25 MG suppository Commonly known as:  PHENERGAN Place 25 mg rectally every 6 (six) hours as needed for nausea or vomiting.   Rocklatan 0.02-0.005 % Soln Generic drug:  Netarsudil-Latanoprost Apply 1 drop to eye at bedtime. Instill one drop in right eye at bedtime   senna 8.6 MG Tabs tablet Commonly known as:  SENOKOT Take 2 tablets by mouth 2 (two) times daily.   venlafaxine 75 MG tablet Commonly known as:  EFFEXOR Take 1 tablet (75 mg total) by mouth 2 (two) times daily.   vitamin C 500 MG tablet Commonly known as:  ASCORBIC ACID Take 500 mg by mouth 2 (two) times daily.   Vitamin D-3 125 MCG (5000 UT) Tabs Take 5,000 Units by mouth daily.   warfarin 5 MG tablet Commonly known as:  COUMADIN Take 1 tablet (5 mg total) by mouth See admin instructions. Managed by Dr. Merian Capron, Mon, Wed, Fri, Sat What changed:    how much to take  additional instructions   warfarin 6 MG tablet Commonly known as:  COUMADIN Take 1 tablet (6 mg total) by mouth daily. Once A Day on Tue, Thu What changed:  Another medication with the same  name was changed. Make sure you understand how and when to take each.         DISCHARGE INSTRUCTIONS:   DIET:  Cardiac diet and Diabetic diet  DISCHARGE CONDITION:  Stable  ACTIVITY:  Activity as tolerated  OXYGEN:  Home Oxygen: No.   Oxygen Delivery: room air  DISCHARGE LOCATION:  nursing home   If you experience worsening of your admission symptoms, develop shortness of breath, life threatening emergency, suicidal or homicidal thoughts you must seek medical attention immediately by calling 911 or calling your MD immediately  if symptoms less severe.  You Must read complete instructions/literature along with all the possible adverse reactions/side effects for all the Medicines you take and that have been prescribed to you. Take any new Medicines after you have completely understood and accpet all the possible adverse reactions/side effects.   Please note  You were cared for by a hospitalist during your hospital stay. If you have any questions about your discharge medications or the care you received while you were in the hospital after you are discharged, you can call the unit and asked to speak with the hospitalist on call if the hospitalist that took care of you is not available. Once you are discharged, your primary care physician will handle any further medical issues. Please note that NO REFILLS for any discharge medications will be authorized once you are discharged, as it is imperative that you return to your primary care physician (or establish a relationship with a primary care physician if you do not have one) for your aftercare needs so that they can reassess your need for medications and monitor your lab values.     Today   Acute events overnight.  Still having some pain in that right hip but improved since surgery.  Had a BM this morning.  No other acute events or complaints presently.  Will discharge to short-term rehab today.  VITAL SIGNS:  Blood pressure (!)  163/69, pulse 94, temperature 98.8 F (37.1 C), temperature source Oral, resp. rate 16, weight 61.7 kg, SpO2  98 %.  I/O:    Intake/Output Summary (Last 24 hours) at 12/10/2018 1038 Last data filed at 12/10/2018 0900 Gross per 24 hour  Intake 480 ml  Output 300 ml  Net 180 ml    PHYSICAL EXAMINATION:   GENERAL:  73 y.o.-year-old patient lying in bed in NAD.  EYES: Pupils equal, round, reactive to light and accommodation. No scleral icterus. Extraocular muscles intact.  HEENT: Head atraumatic, normocephalic. Oropharynx and nasopharynx clear.  NECK:  Supple, no jugular venous distention. No thyroid enlargement, no tenderness.  LUNGS: Normal breath sounds bilaterally, no wheezing, rales, rhonchi. No use of accessory muscles of respiration.  CARDIOVASCULAR: S1, S2 normal. No murmurs, rubs, or gallops.  ABDOMEN: Soft, nontender, nondistended. Bowel sounds present. No organomegaly or mass.  EXTREMITIES: No cyanosis, clubbing or edema b/l.  Right hip dressing in place from surgery yesterday NEUROLOGIC: Cranial nerves II through XII are intact. No focal Motor or sensory deficits b/l. Globally weak.   PSYCHIATRIC: The patient is alert and oriented x 3.  SKIN: No obvious rash, lesion, or ulcer.   DATA REVIEW:   CBC Recent Labs  Lab 12/10/18 0526  WBC 8.0  HGB 8.0*  HCT 24.5*  PLT 183    Chemistries  Recent Labs  Lab 12/09/18 0447  NA 139  K 4.6  CL 108  CO2 20*  GLUCOSE 335*  BUN 24*  CREATININE 0.54  CALCIUM 8.3*    Cardiac Enzymes No results for input(s): TROPONINI in the last 168 hours.   RADIOLOGY:  No results found.    Management plans discussed with the patient, family and they are in agreement.  CODE STATUS:     Code Status Orders  (From admission, onward)         Start     Ordered   12/06/18 2253  Full code  Continuous     12/06/18 2300       TOTAL TIME TAKING CARE OF THIS PATIENT: 40 minutes.    Henreitta Leber M.D on 12/10/2018 at 10:38  AM  Between 7am to 6pm - Pager - [phone]  After 6pm go to [email or web address] - Proofreader  [facility]  Office  [phone]  CC: Primary care physician; Juluis Pitch, MD

## 2018-12-10 NOTE — Progress Notes (Signed)
Physical Therapy Treatment Patient Details Name: ALEXANDRA LIPPS MRN: [id number] DOB: Nov 14, 1945 Today's Date: 12/10/2018    History of Present Illness Pt is a 73 yo female patient with history of osteoporosis, herniated disks in the cervical area, hypertension, hypothyroidism, type 1 diabetes mellitus on insulin pump, CVA in the past currently under hospitalist service for lower lumbar sacral pain.  Assessment includes: R hip fracture with internal fixation.    PT Comments    Pt requesting to use toilet but noted to be incontinent of stool in bed so requiring clean-up prior to getting OOB (brief then donned in bed).  Pt able to walk to bathroom with CGA to min assist x1 using RW (with vc's for technique) but limited distance ambulating d/t R LE pain.  Pt noted to be incontinent of stool in brief upon arriving to toilet and pt sat on toilet to finish toileting (brief removed and placed in trash).  Deferred ambulation back d/t pt's R LE pain; nurse present assisting pt with clean-up and then pt transferred to recliner and wheeled back.  Nurse present and aware of pt's pain.  Will continue to progress pt with strengthening and progressive ambulation per pt tolerance.   Follow Up Recommendations  SNF     Equipment Recommendations  Rolling walker with 5" wheels;3in1 (PT)    Recommendations for Other Services OT consult     Precautions / Restrictions Precautions Precautions: Fall Restrictions Weight Bearing Restrictions: Yes RLE Weight Bearing: Weight bearing as tolerated    Mobility  Bed Mobility Overal bed mobility: Needs Assistance Bed Mobility: Supine to Sit;Rolling Rolling: Mod assist(logrolling L and R in bed for clean-up and to donn brief)   Supine to sit: Mod assist     General bed mobility comments: assist for trunk and R>L LE; vc's for technique and use of bed rail  Transfers Overall transfer level: Needs assistance Equipment used: Rolling walker (2  wheeled) Transfers: Sit to/from [facility] Sit to Stand: Min assist;Mod assist Stand pivot transfers: Min guard;Min assist(stand step turn toilet to recliner in bathroom with RW)       General transfer comment: assist to initiate and come to full stand from bed and from toilet; vc's for UE/LE placement  Ambulation/Gait Ambulation/Gait assistance: Min guard;Min assist Gait Distance (Feet): 25 Feet Assistive device: Rolling walker (2 wheeled)   Gait velocity: decreased   General Gait Details: antalgic; decreased stance time R LE; vc's for walker use and to increase UE support through RW; decreased B LE step length   Stairs             Wheelchair Mobility    Modified Rankin (Stroke Patients Only)       Balance Overall balance assessment: History of Falls;Needs assistance Sitting-balance support: Bilateral upper extremity supported;Feet supported Sitting balance-Leahy Scale: Fair Sitting balance - Comments: steady static sitting   Standing balance support: Bilateral upper extremity supported Standing balance-Leahy Scale: Poor Standing balance comment: pt requiring B UE support on RW for static standing balance                            Cognition Arousal/Alertness: Awake/alert Behavior During Therapy: WFL for tasks assessed/performed Overall Cognitive Status: No family/caregiver present to determine baseline cognitive functioning                                 General Comments:  Oriented to person, place, situation, and year (not month). General confusion noted.      Exercises      General Comments  Pt agreeable to PT session.      Pertinent Vitals/Pain Pain Assessment: Faces Faces Pain Scale: Hurts little more Pain Descriptors / Indicators: Sore;Tender;Grimacing;Guarding Pain Intervention(s): Limited activity within patient's tolerance;Monitored during session;Repositioned;Patient requesting pain meds-RN notified   Vitals (HR and O2 on room air) stable and WFL throughout treatment session.    Home Living Family/patient expects to be discharged to:: Skilled nursing facility Living Arrangements: Spouse/significant other                  Prior Function            PT Goals (current goals can now be found in the care plan section) Acute Rehab PT Goals Patient Stated Goal: To go home PT Goal Formulation: With patient Progress towards PT goals: Progressing toward goals    Frequency    BID      PT Plan Current plan remains appropriate    Co-evaluation              AM-PAC PT "6 Clicks" Mobility   Outcome Measure  Help needed turning from your back to your side while in a flat bed without using bedrails?: A Lot Help needed moving from lying on your back to sitting on the side of a flat bed without using bedrails?: A Lot Help needed moving to and from a bed to a chair (including a wheelchair)?: A Lot Help needed standing up from a chair using your arms (e.g., wheelchair or bedside chair)?: A Lot Help needed to walk in hospital room?: A Little Help needed climbing 3-5 steps with a railing? : Total 6 Click Score: 12    End of Session Equipment Utilized During Treatment: Gait belt Activity Tolerance: Patient limited by pain Patient left: in chair;with call bell/phone within reach;with chair alarm set;with nursing/sitter in room;with SCD's reapplied(Nurse present and reported she would finish setting pt up) Nurse Communication: Mobility status;Precautions;Patient requests pain meds;Weight bearing status PT Visit Diagnosis: Unsteadiness on feet (R26.81);History of falling (Z91.81);Muscle weakness (generalized) (M62.81);Difficulty in walking, not elsewhere classified (R26.2);Pain Pain - Right/Left: Right Pain - part of body: Hip     Time: 7673-4193 PT Time Calculation (min) (ACUTE ONLY): 39 min  Charges:  $Gait Training: 8-22 mins $Therapeutic Activity: 23-37 mins                      [facility], PT 12/10/18, 1:43 PM [phone]

## 2018-12-10 NOTE — TOC Transition Note (Signed)
Transition of Care [facility]) - CM/SW Discharge Note   Patient Details  Name: Erin Daniels MRN: [id number] Date of Birth: 1945/11/18  [facility] CM/SW Contact:  Annamaria Boots, [place] Phone Number: 12/10/2018, 10:50 AM   Clinical Narrative:  Patient is medically ready for discharge today. CSW notified patient of discharge today. Patient chose bed at [facility]. CSW notified Magda Paganini at [facility] of discharge today. CSW also notified patient's husband of discharge today. Patient will be transported by EMS. RN to call report and call for transport.      Final next level of care: Skilled Nursing Facility Barriers to Discharge: No Barriers Identified   Patient Goals and CMS Choice Patient states their goals for this hospitalization and ongoing recovery are:: to get better CMS Medicare.gov Compare Post Acute Care list provided to:: Patient Choice offered to / list presented to : Patient  Discharge Placement   Existing PASRR number confirmed : 12/09/18          Patient chooses bed at: [facility] Patient to be transferred to facility by: EMS Name of family member notified: Erin Daniels- husband  Patient and family notified of of transfer: 12/10/18  Discharge Plan and Services   Discharge Planning Services: CM Consult                      Social Determinants of Health ([place]) Interventions     Readmission Risk Interventions Readmission Risk Prevention Plan 12/07/2018  Transportation Screening Complete  HRI or [place] Complete  Social Work Consult for [place] Planning/Counseling Complete  Medication Review [employer]) Complete  Some recent data might be hidden

## 2018-12-19 ENCOUNTER — Emergency Department
Admission: EM | Admit: 2018-12-19 | Discharge: 2018-12-19 | Disposition: A | Discharge status: Home / Self Care | Payer: Medicare Other | Attending: Emergency Medicine | Admitting: Emergency Medicine

## 2018-12-19 ENCOUNTER — Other Ambulatory Visit: Payer: Self-pay

## 2018-12-19 DIAGNOSIS — I10 Essential (primary) hypertension: Secondary | ICD-10-CM | POA: Diagnosis not present

## 2018-12-19 DIAGNOSIS — Z79899 Other long term (current) drug therapy: Secondary | ICD-10-CM | POA: Insufficient documentation

## 2018-12-19 DIAGNOSIS — N3001 Acute cystitis with hematuria: Secondary | ICD-10-CM | POA: Insufficient documentation

## 2018-12-19 DIAGNOSIS — E1065 Type 1 diabetes mellitus with hyperglycemia: Secondary | ICD-10-CM | POA: Insufficient documentation

## 2018-12-19 DIAGNOSIS — R739 Hyperglycemia, unspecified: Secondary | ICD-10-CM

## 2018-12-19 DIAGNOSIS — Z7901 Long term (current) use of anticoagulants: Secondary | ICD-10-CM | POA: Insufficient documentation

## 2018-12-19 DIAGNOSIS — E785 Hyperlipidemia, unspecified: Secondary | ICD-10-CM | POA: Diagnosis not present

## 2018-12-19 LAB — LACTIC ACID, PLASMA
Lactic Acid, Venous: 2.7 mmol/L (ref 0.5–1.9)
Lactic Acid, Venous: 1.5 mmol/L (ref 0.5–1.9)

## 2018-12-19 LAB — COMPREHENSIVE METABOLIC PANEL
ALT: 19 U/L (ref 0–44)
AST: 27 U/L (ref 15–41)
Albumin: 3.4 g/dL — ABNORMAL LOW (ref 3.5–5.0)
Alkaline Phosphatase: 122 U/L (ref 38–126)
Anion gap: 11 (ref 5–15)
BUN: 32 mg/dL — ABNORMAL HIGH (ref 8–23)
CO2: 28 mmol/L (ref 22–32)
Calcium: 9.4 mg/dL (ref 8.9–10.3)
Chloride: 98 mmol/L (ref 98–111)
Creatinine, Ser: 0.83 mg/dL (ref 0.44–1.00)
GFR calc Af Amer: >60 mL/min (ref >60)
GFR calc non Af Amer: >60 mL/min (ref >60)
Glucose, Bld: 221 mg/dL — ABNORMAL HIGH (ref 70–99)
Potassium: 4.5 mmol/L (ref 3.5–5.1)
Sodium: 137 mmol/L (ref 135–145)
Total Bilirubin: 0.7 mg/dL (ref 0.3–1.2)
Total Protein: 6.2 g/dL — ABNORMAL LOW (ref 6.5–8.1)

## 2018-12-19 LAB — URINALYSIS, COMPLETE (UACMP) WITH MICROSCOPIC
Bilirubin Urine: NEGATIVE
Glucose, UA: >=500 mg/dL — AB
Hgb urine dipstick: NEGATIVE
Ketones, ur: 5 mg/dL — AB
Nitrite: NEGATIVE
Protein, ur: NEGATIVE mg/dL
Specific Gravity, Urine: 1.02 (ref 1.005–1.030)
Squamous Epithelial / HPF: NONE SEEN (ref 0–5)
pH: 5 (ref 5.0–8.0)

## 2018-12-19 LAB — CBC WITH DIFFERENTIAL/PLATELET
Abs Immature Granulocytes: 0.06 10*3/uL (ref 0.00–0.07)
Basophils Absolute: 0 10*3/uL (ref 0.0–0.1)
Basophils Relative: 0 %
Eosinophils Absolute: 0.2 10*3/uL (ref 0.0–0.5)
Eosinophils Relative: 2 %
HCT: 27.2 % — ABNORMAL LOW (ref 36.0–46.0)
Hemoglobin: 8.9 g/dL — ABNORMAL LOW (ref 12.0–15.0)
Immature Granulocytes: 1 %
Lymphocytes Relative: 19 %
Lymphs Abs: 1.9 10*3/uL (ref 0.7–4.0)
MCH: 31.8 pg (ref 26.0–34.0)
MCHC: 32.7 g/dL (ref 30.0–36.0)
MCV: 97.1 fL (ref 80.0–100.0)
Monocytes Absolute: 1.3 10*3/uL — ABNORMAL HIGH (ref 0.1–1.0)
Monocytes Relative: 12 %
Neutro Abs: 6.7 10*3/uL (ref 1.7–7.7)
Neutrophils Relative %: 66 %
Platelets: 445 10*3/uL — ABNORMAL HIGH (ref 150–400)
RBC: 2.8 MIL/uL — ABNORMAL LOW (ref 3.87–5.11)
RDW: 16 % — ABNORMAL HIGH (ref 11.5–15.5)
WBC: 10.1 10*3/uL (ref 4.0–10.5)
nRBC: 0 % (ref 0.0–0.2)

## 2018-12-19 LAB — BLOOD GAS, VENOUS
Acid-Base Excess: 3.4 mmol/L — ABNORMAL HIGH (ref 0.0–2.0)
Bicarbonate: 29.6 mmol/L — ABNORMAL HIGH (ref 20.0–28.0)
O2 Saturation: 70.8 %
Patient temperature: 37
pCO2, Ven: 50 mmHg (ref 44.0–60.0)
pH, Ven: 7.38 (ref 7.250–7.430)
pO2, Ven: 38 mmHg (ref 32.0–45.0)

## 2018-12-19 LAB — GLUCOSE, CAPILLARY
Glucose-Capillary: 214 mg/dL — ABNORMAL HIGH (ref 70–99)
Glucose-Capillary: 300 mg/dL — ABNORMAL HIGH (ref 70–99)
Glucose-Capillary: 270 mg/dL — ABNORMAL HIGH (ref 70–99)

## 2018-12-19 LAB — PROTIME-INR
INR: 1.3 — ABNORMAL HIGH (ref 0.8–1.2)
Prothrombin Time: 15.8 seconds — ABNORMAL HIGH (ref 11.4–15.2)

## 2018-12-19 MED ORDER — SODIUM CHLORIDE 0.9 % IV BOLUS
1000.0000 mL | Freq: Once | INTRAVENOUS | Status: AC
  Administered 2018-12-19: 19:00:00 1000 mL via INTRAVENOUS

## 2018-12-19 MED ORDER — SODIUM CHLORIDE 0.9 % IV SOLN
1.0000 g | Freq: Once | INTRAVENOUS | Status: AC
  Administered 2018-12-19: 20:00:00 1 g via INTRAVENOUS
  Filled 2018-12-19: qty 10

## 2018-12-19 MED ORDER — INSULIN ASPART 100 UNIT/ML ~~LOC~~ SOLN
10.0000 [IU] | Freq: Once | SUBCUTANEOUS | Status: AC
  Administered 2018-12-19: 10 [IU] via INTRAVENOUS
  Filled 2018-12-19: qty 1

## 2018-12-19 MED ORDER — CEPHALEXIN 500 MG PO CAPS: 500.0000 mg | ORAL_CAPSULE | Freq: Three times a day (TID) | ORAL | 0 refills | Status: AC

## 2018-12-19 NOTE — ED Notes (Signed)
Report called to shahborn at [facility] regarding pt d/c and medication changes. Instruction read to staff member and instructed to refer to d/c papers for changes to medications doses. Staff denied having any transportation for pt, states pt will have to return via ems

## 2018-12-19 NOTE — Discharge Instructions (Addendum)
Please change patient's sliding scale to the one below for meals and bedtime:  CBG 101 - 120: 1 units  CBG 121 - 150: 3 units  CBG 151 - 200: 5 units  CBG 201 - 250: 7 units  CBG 251 - 300: 11 units  CBG 301 - 350: 15 units  CBG 351 - 400: 20 units  CBG > 400: go to ER  Increase Lantus to 25U at bedtime. Please make sure to follow her sugars closely, as they should improve once her UTI is treated. As sugars go down, you may return to original scale.   Increase her warfarin to 7.5mg  3 times a week (Monday, Wednesday, Saturday) and continue 5mg  on Friday and Sunday. Re-check her INR in 48 hours.   Give keflex as prescribed for UTI.  Return to the emergency room for fever, abdominal pain, back pain, blood glucose greater than 400, nausea or vomiting.

## 2018-12-19 NOTE — ED Provider Notes (Signed)
[facility] Emergency Department Provider Note  ____________________________________________  Time seen: Approximately 6:56 PM  I have reviewed the triage vital signs and the nursing notes.   HISTORY  Chief Complaint Abnormal Lab   HPI Erin Daniels is a 73 y.o. female with a history of type 1 diabetes, POD 12 of a right intertrochanteric hip fracture repair, hypertension, hyperlipidemia, CVA on Coumadin who presents for evaluation of elevated blood glucose and lactic acid.  Patient is currently in a nursing home.  When they checked her sugar this evening it was greater than 600.  They check labs and her lactic was elevated.  They gave her insulin and called 911. CBG per EMS 147 after insulin given.  Patient denies headache, fever, chills, nausea, vomiting, diarrhea, dysuria, chest pain, cough, shortness of breath, abdominal pain.  She reports that she feels at baseline.  She reports mild pain in her right hip which has been improving since the surgery.  Past Medical History:  Diagnosis Date  . At high risk for falls   . Basal cell carcinoma    right upper leg  . Complication of anesthesia    difficult to arouse  . Diabetes mellitus without complication (HCC)    Type I  . Diabetic neuropathy ([place])   . Diabetic retinopathy ([place])   . Dizziness   . Gastroparesis   . Herniated disc, cervical   . Hypercholesteremia   . Hypertension   . Hypotensive episode   . Hypothyroidism   . Insulin pump in place   . Myofascial muscle pain   . Numbness of arm    left  . Osteoporosis   . Poor vision   . Seizures ([place])   . Slurred speech   . Stroke (cerebrum) ([place])    4/19  . Stroke (HCC)    x5  . Vertigo   . Wears glasses     Patient Active Problem List   Diagnosis Date Noted  . Intertrochanteric fracture of right hip ([place]) 12/08/2018  . Hypercoagulopathy ([place]) 09/29/2018  . Acute anxiety 09/29/2018  . Hypertension associated with type 1 diabetes  mellitus ([place]) 09/19/2018  . Dyslipidemia due to type 1 diabetes mellitus ([place]) 09/19/2018  . Anemia of chronic disease 09/19/2018  . Chronic migraine without aura 09/19/2018  . Chronic non-seasonal allergic rhinitis 09/19/2018  . Major depression, recurrent, chronic ([place]) 09/19/2018  . Diabetic gastroparesis associated with type 1 diabetes mellitus ([place]) 09/19/2018  . Bilateral sacral insufficiency fracture 09/19/2018  . Constipation 09/15/2018  . Acute urinary retention 09/15/2018  . Intractable pain 09/15/2018  . Chronic cerebrovascular accident (CVA) 05/28/2018  . Diabetic polyneuropathy associated with type 1 diabetes mellitus ([place]) 12/10/2017  . Numbness and tingling of both legs 12/10/2017  . Chronic pain syndrome 12/10/2017  . DKA (diabetic ketoacidoses) ([place]) 11/12/2017  . Confusion 11/11/2017  . Hypoglycemia 11/26/2016  . Lactic acid acidosis   . Sepsis ([place])   . 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 Essential hypertension   . Depression   . Right radial fracture 05/29/2015    Past Surgical History:  Procedure Laterality Date  . APPENDECTOMY    . AUGMENTATION MAMMAPLASTY Bilateral 2011  . BILATERAL CARPAL TUNNEL RELEASE    . COLONOSCOPY    . EYE SURGERY    . HEMORROIDECTOMY    . HERNIA REPAIR    . INTRAMEDULLARY (IM) NAIL INTERTROCHANTERIC Right 12/07/2018   Procedure: INTRAMEDULLARY (IM) NAIL INTERTROCHANTRIC;  Surgeon:  Thornton Park, MD;  Location: [facility] ORS;  Service: Orthopedics;  Laterality: Right;  . JOINT REPLACEMENT    . OPEN REDUCTION INTERNAL FIXATION (ORIF) DISTAL RADIAL FRACTURE Right 05/29/2015   Procedure: OPEN REDUCTION INTERNAL FIXATION (ORIF) RIGHT DISTAL RADIUS FRACTURE WITH ALLOGRAFT BONE GRAFTING FOR REPAIR AND RECONSTRUCTION;  Surgeon: Roseanne Kaufman, MD;  Location: [facility];  Service: Orthopedics;  Laterality: Right;  . SHOULDER ARTHROSCOPY Left   . SHOULDER HEMI-ARTHROPLASTY Left   . SYNOVECTOMY    .  TONSILLECTOMY    . TRIGGER FINGER RELEASE    . TUBAL LIGATION      Prior to Admission medications   Medication Sig Start Date End Date Taking? Authorizing Provider  aspirin EC 81 MG tablet Take 81 mg by mouth daily.   Yes [provider]  brimonidine-timolol (COMBIGAN) 0.2-0.5 % ophthalmic solution Place 1 drop into both eyes 3 (three) times daily. Instill one drop in right eye morning, noon and bedtime   Yes [provider]  Calcium Carb-Cholecalciferol (CALCIUM 600 + D) 600-200 MG-UNIT TABS Take 1 tablet by mouth every evening.   Yes [provider]  Cholecalciferol (VITAMIN D-3) 5000 units TABS Take 5,000 Units by mouth daily.   Yes [provider]  ferrous sulfate 325 (65 FE) MG tablet Take 325 mg by mouth daily with breakfast.   Yes [provider]  gabapentin (NEURONTIN) 300 MG capsule 300 mg in the morning, 300 mg in the afternoon, 900 mg in the evening Patient taking differently: Take 300-900 mg by mouth 3 (three) times daily. 300 mg in the morning, 300 mg in the afternoon, 900 mg in the evening 10/10/18  Yes Medina-Vargas, Monina C, NP  Garlic 4315 MG CAPS Take 1 capsule by mouth daily.  09/18/18  Yes [provider]  HYDROcodone-acetaminophen (NORCO/VICODIN) 5-325 MG tablet Take 1-2 tablets by mouth every 4 (four) hours as needed for moderate pain (pain score 4-6). 12/10/18  Yes Sainani, Belia Heman, MD  insulin aspart (NOVOLOG FLEXPEN) 100 UNIT/ML FlexPen Inject 0-15 Units into the skin 3 (three) times daily with meals. Inject 0-15 units subcutaneous per sliding scale before meals and at bedtime 0 - 100 units = 0 units 101 - 120 = 1 unit 121 - 150 = 2 units 151 - 200 = 4 units 201 - 250 = 6 units 251 - 300 = 8 units 301 - 350 = 10 units 351 - 400 = 12 units 401 - 450 = 15 units > 450 call MD 10/10/18  Yes Medina-Vargas, Monina C, NP  Insulin Glargine (LANTUS SOLOSTAR) 100 UNIT/ML Solostar Pen Inject 10 Units into the skin at bedtime.  12/10/18  Yes Sainani, Belia Heman, MD  losartan (COZAAR) 50 MG tablet Take 1 tablet (50 mg total) by mouth daily for 30 days. 10/10/18 12/19/18 Yes Medina-Vargas, Monina C, NP  Multiple Vitamins-Minerals (CEROVITE ADVANCED FORMULA PO) Take 1 tablet by mouth daily. 09/18/18  Yes [provider]  Netarsudil-Latanoprost (ROCKLATAN) 0.02-0.005 % SOLN Apply 1 drop to eye at bedtime. Instill one drop in right eye at bedtime   Yes [provider]  venlafaxine (EFFEXOR) 75 MG tablet Take 1 tablet (75 mg total) by mouth 2 (two) times daily. 10/10/18  Yes Medina-Vargas, Monina C, NP  vitamin C (ASCORBIC ACID) 500 MG tablet Take 500 mg by mouth 2 (two) times daily.   Yes [provider]  warfarin (COUMADIN) 5 MG tablet Take 1 tablet (5 mg total) by mouth See admin instructions. Managed by Dr.  Merian Capron, Mon, Wed, Fri, Sat Patient taking differently: Take 5 mg by mouth See admin instructions. Take one tablet (5 mg) by mouth Monday, Wednesday, Friday, Saturday Sunday . Managed by Dr. Ouida Sills 10/10/18  Yes Medina-Vargas, Monina C, NP  warfarin (COUMADIN) 6 MG tablet Take 1 tablet (6 mg total) by mouth daily. Once A Day on Tue, Thu 10/10/18  Yes Medina-Vargas, Monina C, NP  acetaminophen (TYLENOL) 325 MG tablet Take 2 tablets (650 mg total) by mouth every 6 (six) hours as needed for headache. For mild pain 09/18/18   Saundra Shelling, MD  atorvastatin (LIPITOR) 40 MG tablet Take 1 tablet (40 mg total) by mouth daily. Patient not taking: Reported on 12/19/2018 10/10/18   Medina-Vargas, Monina C, NP  butalbital-acetaminophen-caffeine (FIORICET) 50-325-40 MG tablet Take 1 tablet by mouth 2 (two) times daily. 10/25/18   [provider]  carboxymethylcellulose (REFRESH PLUS) 0.5 % SOLN Place 1 drop into both eyes as needed (for dry eyes). 10/10/18   Medina-Vargas, Monina C, NP  cetirizine (ZYRTEC) 10 MG tablet Take 1 tablet (10 mg total) by mouth daily. 10/10/18   Medina-Vargas, Monina C, NP   glucose 4 GM chewable tablet Chew 1 tablet (4 g total) by mouth as needed for low blood sugar. <65 10/10/18   Medina-Vargas, Monina C, NP  NON FORMULARY Diet type:  NCS 09/18/18   [provider]  polyethylene glycol (MIRALAX / GLYCOLAX) packet Take 17 g by mouth daily.     [provider]  promethazine (PHENERGAN) 25 MG suppository Place 25 mg rectally every 6 (six) hours as needed for nausea or vomiting.    [provider]  senna (SENOKOT) 8.6 MG TABS tablet Take 2 tablets by mouth 2 (two) times daily. 09/19/18   [provider]    Allergies Codeine; Meperidine; Metoclopramide hcl; Oxycodone; Stadol [butorphanol]; Tussionex pennkinetic er [employer] polst-cpm polst er]; and Carbocaine  [mepivacaine hcl]  Family History  Problem Relation Age of Onset  . Hypertension Mother   . Stroke Mother   . Heart failure Father     Social History Social History   Tobacco Use  . Smoking status: Never Smoker  . Smokeless tobacco: Never Used  Substance Use Topics  . Alcohol use: No  . Drug use: No    Review of Systems  Constitutional: Negative for fever. Eyes: Negative for visual changes. ENT: Negative for sore throat. Neck: No neck pain  Cardiovascular: Negative for chest pain. Respiratory: Negative for shortness of breath. Gastrointestinal: Negative for abdominal pain, vomiting or diarrhea. Genitourinary: Negative for dysuria. Musculoskeletal: Negative for back pain. Skin: Negative for rash. Neurological: Negative for headaches, weakness or numbness. Psych: No SI or HI  ____________________________________________   PHYSICAL EXAM:  VITAL SIGNS: ED Triage Vitals  Enc Vitals Group     BP 12/19/18 1732 132/69     Pulse Rate 12/19/18 1739 88     Resp 12/19/18 1739 15     Temp 12/19/18 1739 98.2 F (36.8 C)     Temp src --      SpO2 12/19/18 1732 98 %     Weight 12/19/18 1735 108 lb (49 kg)     Height 12/19/18 1735 5\' 2"  (1.575 m)     Head  Circumference --      Peak Flow --      Pain Score 12/19/18 1752 0     Pain Loc --      Pain Edu? --      Excl.  in [place]? --     Constitutional: Alert and oriented. Well appearing and in no apparent distress. HEENT:      Head: Normocephalic and atraumatic.         Eyes: Conjunctivae are normal. Sclera is non-icteric.       Mouth/Throat: Mucous membranes are moist.       Neck: Supple with no signs of meningismus. Cardiovascular: Regular rate and rhythm. No murmurs, gallops, or rubs. 2+ symmetrical distal pulses are present in all extremities. No JVD. Respiratory: Normal respiratory effort. Lungs are clear to auscultation bilaterally. No wheezes, crackles, or rhonchi.  Gastrointestinal: Soft, non tender, and non distended with positive bowel sounds. No rebound or guarding. Musculoskeletal: Surgical site is well-healing with no evidence of cellulitis, patient has great range of motion of her hip with no pain.   Neurologic: Normal speech and language. Face is symmetric. Moving all extremities. No gross focal neurologic deficits are appreciated. Skin: Skin is warm, dry and intact. No rash noted. Psychiatric: Mood and affect are normal. Speech and behavior are normal.  ____________________________________________   LABS (all labs ordered are listed, but only abnormal results are displayed)  Labs Reviewed  CBC WITH DIFFERENTIAL/PLATELET - Abnormal; Notable for the following components:      Result Value   RBC 2.80 (*)    Hemoglobin 8.9 (*)    HCT 27.2 (*)    RDW 16.0 (*)    Platelets 445 (*)    Monocytes Absolute 1.3 (*)    All other components within normal limits  COMPREHENSIVE METABOLIC PANEL - Abnormal; Notable for the following components:   Glucose, Bld 221 (*)    BUN 32 (*)    Total Protein 6.2 (*)    Albumin 3.4 (*)    All other components within normal limits  LACTIC ACID, PLASMA - Abnormal; Notable for the following components:   Lactic Acid, Venous 2.7 (*)    All other  components within normal limits  BLOOD GAS, VENOUS - Abnormal; Notable for the following components:   Bicarbonate 29.6 (*)    Acid-Base Excess 3.4 (*)    All other components within normal limits  GLUCOSE, CAPILLARY - Abnormal; Notable for the following components:   Glucose-Capillary 214 (*)    All other components within normal limits  URINALYSIS, COMPLETE (UACMP) WITH MICROSCOPIC - Abnormal; Notable for the following components:   Color, Urine YELLOW (*)    APPearance CLOUDY (*)    Glucose, UA >=500 (*)    Ketones, ur 5 (*)    Leukocytes,Ua MODERATE (*)    Bacteria, UA MANY (*)    All other components within normal limits  PROTIME-INR - Abnormal; Notable for the following components:   Prothrombin Time 15.8 (*)    INR 1.3 (*)    All other components within normal limits  GLUCOSE, CAPILLARY - Abnormal; Notable for the following components:   Glucose-Capillary 300 (*)    All other components within normal limits  GLUCOSE, CAPILLARY - Abnormal; Notable for the following components:   Glucose-Capillary 270 (*)    All other components within normal limits  URINE CULTURE  LACTIC ACID, PLASMA  CBG MONITORING, ED  CBG MONITORING, ED   ____________________________________________  EKG  ED ECG REPORT I, Rudene Re, the attending physician, personally viewed and interpreted this ECG.  Normal sinus rhythm, rate of 81, normal intervals, normal axis, no ST elevations or depressions. ____________________________________________  RADIOLOGY  none  ____________________________________________   PROCEDURES  Procedure(s) performed: None Procedures Critical  Care performed:  None ____________________________________________   INITIAL IMPRESSION / ASSESSMENT AND PLAN / ED COURSE  73 y.o. female with a history of type 1 diabetes, POD 12 of a right intertrochanteric hip fracture repair, hypertension, hyperlipidemia, CVA on Coumadin who presents for evaluation of elevated  blood glucose and lactic acid.  Patient with blood glucose of 600 in the nursing home earlier today.  Received insulin.  EMS was called a few hours later when basic labs returned with an elevated lactic.  When EMS arrived patient's blood glucose was 147.  She has no complaints at this time.  Patient is well-appearing, well-healing scar from her hip fracture repair with great range of motion and no clinical signs of septic joint or postop infection.  Lungs are clear, abdomen is soft.  Patient has no chest pain or shortness of breath, no tachycardia, normal work of breathing, no hypoxia and she is currently on Coumadin therefore low suspicion for PE.  No signs of pneumonia based on history and physical exam.  Elevated lactic's most likely due to hyperglycemia.  Repeat labs here shows glucose of 221, no anion gap, normal bicarb, and a lactic acid of 2.7.  No white count, no fever, no tachycardia, no signs of infection anywhere.  UA is pending to rule out possible UTI causing patient's hyperglycemia.  At this time lactic acidosis is most likely due to hyperglycemia.  Will give a liter fluid and repeat lactic acid.    _________________________ 10:21 PM on 12/19/2018 -----------------------------------------  UA positive for UTI. Patient given Rocephin. Patient remains with no signs of sepsis with no fever or tachycardia.  Blood glucose trended hour after IV fluids and insulin.  Initially had discussed with Dr. Jannifer Franklin for admission for better management of her blood glucose.  However since patient is not in DKA and she is currently on a mild sliding scale, he recommended increasing her sliding scale, increasing her Lantus, and treating her UTI as an outpatient at the nursing home.  I discussed these recommendations with the receiving nurse at the facility.  Also recommended increasing her Coumadin to 7.5 mg daily 3 days a week and maintaining 5 mg 2 days a week with recheck of INR in 48 hours.  Discussed strict  return precautions and close follow-up.   As part of my medical decision making, I reviewed the following data within the [place] notes reviewed and incorporated, Labs reviewed , EKG interpreted , Old EKG reviewed, Old chart reviewed, A consult was requested and obtained from this/these consultant(s) Hospitalist, Notes from prior ED visits and [place] Controlled Substance Database    Pertinent labs & imaging results that were available during my care of the patient were reviewed by me and considered in my medical decision making (see chart for details).    ____________________________________________   FINAL CLINICAL IMPRESSION(S) / ED DIAGNOSES  Final diagnoses:  Hyperglycemia  Acute cystitis with hematuria      NEW MEDICATIONS STARTED DURING THIS VISIT:  ED Discharge Orders    None       Note:  This document was prepared using Dragon voice recognition software and may include unintentional dictation errors.    Rudene Re, MD 12/19/18 2225

## 2018-12-19 NOTE — ED Triage Notes (Signed)
Pt arrives via ems from [facility]. Ems reports pt has elevated lactic acid. CBG earlier today was 600, pt given insulin at facility and decreased to to 147. Pt a&o x 4 on arrival with NAD noted at this time.

## 2018-12-22 LAB — URINE CULTURE: Culture: >=100000 — AB

## 2019-01-03 ENCOUNTER — Other Ambulatory Visit: Payer: Self-pay

## 2019-01-03 ENCOUNTER — Emergency Department: Payer: Medicare Other

## 2019-01-03 ENCOUNTER — Emergency Department
Admission: EM | Admit: 2019-01-03 | Discharge: 2019-01-03 | Disposition: A | Discharge status: Home / Self Care | Payer: Medicare Other | Attending: Emergency Medicine | Admitting: Emergency Medicine

## 2019-01-03 ENCOUNTER — Encounter: Payer: Self-pay | Admitting: Emergency Medicine

## 2019-01-03 DIAGNOSIS — E1042 Type 1 diabetes mellitus with diabetic polyneuropathy: Secondary | ICD-10-CM | POA: Insufficient documentation

## 2019-01-03 DIAGNOSIS — I10 Essential (primary) hypertension: Secondary | ICD-10-CM | POA: Insufficient documentation

## 2019-01-03 DIAGNOSIS — S6991XA Unspecified injury of right wrist, hand and finger(s), initial encounter: Secondary | ICD-10-CM | POA: Diagnosis present

## 2019-01-03 DIAGNOSIS — Z7982 Long term (current) use of aspirin: Secondary | ICD-10-CM | POA: Diagnosis not present

## 2019-01-03 DIAGNOSIS — Z79899 Other long term (current) drug therapy: Secondary | ICD-10-CM | POA: Insufficient documentation

## 2019-01-03 DIAGNOSIS — Y92009 Unspecified place in unspecified non-institutional (private) residence as the place of occurrence of the external cause: Secondary | ICD-10-CM | POA: Diagnosis not present

## 2019-01-03 DIAGNOSIS — W010XXA Fall on same level from slipping, tripping and stumbling without subsequent striking against object, initial encounter: Secondary | ICD-10-CM | POA: Insufficient documentation

## 2019-01-03 DIAGNOSIS — E10319 Type 1 diabetes mellitus with unspecified diabetic retinopathy without macular edema: Secondary | ICD-10-CM | POA: Diagnosis not present

## 2019-01-03 DIAGNOSIS — Y939 Activity, unspecified: Secondary | ICD-10-CM | POA: Insufficient documentation

## 2019-01-03 DIAGNOSIS — Y999 Unspecified external cause status: Secondary | ICD-10-CM | POA: Insufficient documentation

## 2019-01-03 DIAGNOSIS — E039 Hypothyroidism, unspecified: Secondary | ICD-10-CM | POA: Diagnosis not present

## 2019-01-03 DIAGNOSIS — Z794 Long term (current) use of insulin: Secondary | ICD-10-CM | POA: Insufficient documentation

## 2019-01-03 DIAGNOSIS — S62350A Nondisplaced fracture of shaft of second metacarpal bone, right hand, initial encounter for closed fracture: Secondary | ICD-10-CM | POA: Diagnosis not present

## 2019-01-03 LAB — GLUCOSE, CAPILLARY: Glucose-Capillary: 245 mg/dL — ABNORMAL HIGH (ref 70–99)

## 2019-01-03 MED ORDER — HYDROCODONE-ACETAMINOPHEN 5-325 MG PO TABS
1.0000 | ORAL_TABLET | Freq: Once | ORAL | Status: AC
  Administered 2019-01-03: 1 via ORAL
  Filled 2019-01-03: qty 1

## 2019-01-03 NOTE — Discharge Instructions (Addendum)
Keep the splint clean and dry. Take your home pain medicine as needed. Follow-up with the ortho provider on Monday, for further fracture care.

## 2019-01-03 NOTE — ED Provider Notes (Signed)
[facility] Emergency Department Provider Note ____________________________________________  Time seen: 1820  I have reviewed the triage vital signs and the nursing notes.  HISTORY  Chief Complaint  Arm Pain  HPI Erin Daniels is a 73 y.o. female presents to the ED from home, for evaluation of injury sustained following mechanical fall.  Patient describes she tripped over yesterday while she was bending over to feed her dog.  She fell on an outstretched right hand, and has had increasing pain, swelling, and bruising since that time.  She presents today for further evaluation of continued swelling and bruising to the right hand.  Patient has a history of diabetes mellitus, hyper tension, hypothyroidism, and osteoporosis.  She is on anticoagulation therapy with Coumadin daily.  It is also about 4 weeks status post a right hip fracture with intramedullary repair.  Past Medical History:  Diagnosis Date  . At high risk for falls   . Basal cell carcinoma    right upper leg  . Complication of anesthesia    difficult to arouse  . Diabetes mellitus without complication (HCC)    Type I  . Diabetic neuropathy ([place])   . Diabetic retinopathy ([place])   . Dizziness   . Gastroparesis   . Herniated disc, cervical   . Hypercholesteremia   . Hypertension   . Hypotensive episode   . Hypothyroidism   . Insulin pump in place   . Myofascial muscle pain   . Numbness of arm    left  . Osteoporosis   . Poor vision   . Seizures ([place])   . Slurred speech   . Stroke (cerebrum) ([place])    4/19  . Stroke (HCC)    x5  . Vertigo   . Wears glasses     Patient Active Problem List   Diagnosis Date Noted  . Intertrochanteric fracture of right hip ([place]) 12/08/2018  . Hypercoagulopathy ([place]) 09/29/2018  . Acute anxiety 09/29/2018  . Hypertension associated with type 1 diabetes mellitus ([place]) 09/19/2018  . Dyslipidemia due to type 1 diabetes mellitus ([place]) 09/19/2018  . Anemia of  chronic disease 09/19/2018  . Chronic migraine without aura 09/19/2018  . Chronic non-seasonal allergic rhinitis 09/19/2018  . Major depression, recurrent, chronic ([place]) 09/19/2018  . Diabetic gastroparesis associated with type 1 diabetes mellitus ([place]) 09/19/2018  . Bilateral sacral insufficiency fracture 09/19/2018  . Constipation 09/15/2018  . Acute urinary retention 09/15/2018  . Intractable pain 09/15/2018  . Chronic cerebrovascular accident (CVA) 05/28/2018  . Diabetic polyneuropathy associated with type 1 diabetes mellitus ([place]) 12/10/2017  . Numbness and tingling of both legs 12/10/2017  . Chronic pain syndrome 12/10/2017  . DKA (diabetic ketoacidoses) ([place]) 11/12/2017  . Confusion 11/11/2017  . Hypoglycemia 11/26/2016  . Lactic acid acidosis   . Sepsis ([place])   . 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 Essential hypertension   . Depression   . Right radial fracture 05/29/2015    Past Surgical History:  Procedure Laterality Date  . APPENDECTOMY    . AUGMENTATION MAMMAPLASTY Bilateral 2011  . BILATERAL CARPAL TUNNEL RELEASE    . COLONOSCOPY    . EYE SURGERY    . HEMORROIDECTOMY    . HERNIA REPAIR    . INTRAMEDULLARY (IM) NAIL INTERTROCHANTERIC Right 12/07/2018   Procedure: INTRAMEDULLARY (IM) NAIL INTERTROCHANTRIC;  Surgeon: Thornton Park, MD;  Location: [facility] ORS;  Service: Orthopedics;  Laterality: Right;  . JOINT REPLACEMENT    .  OPEN REDUCTION INTERNAL FIXATION (ORIF) DISTAL RADIAL FRACTURE Right 05/29/2015   Procedure: OPEN REDUCTION INTERNAL FIXATION (ORIF) RIGHT DISTAL RADIUS FRACTURE WITH ALLOGRAFT BONE GRAFTING FOR REPAIR AND RECONSTRUCTION;  Surgeon: Roseanne Kaufman, MD;  Location: [facility];  Service: Orthopedics;  Laterality: Right;  . SHOULDER ARTHROSCOPY Left   . SHOULDER HEMI-ARTHROPLASTY Left   . SYNOVECTOMY    . TONSILLECTOMY    . TRIGGER FINGER RELEASE    . TUBAL LIGATION      Prior to Admission medications    Medication Sig Start Date End Date Taking? Authorizing Provider  acetaminophen (TYLENOL) 325 MG tablet Take 2 tablets (650 mg total) by mouth every 6 (six) hours as needed for headache. For mild pain 09/18/18   Saundra Shelling, MD  aspirin EC 81 MG tablet Take 81 mg by mouth daily.    [provider]  atorvastatin (LIPITOR) 40 MG tablet Take 1 tablet (40 mg total) by mouth daily. Patient not taking: Reported on 12/19/2018 10/10/18   Medina-Vargas, Monina C, NP  brimonidine-timolol (COMBIGAN) 0.2-0.5 % ophthalmic solution Place 1 drop into both eyes 3 (three) times daily. Instill one drop in right eye morning, noon and bedtime    [provider]  butalbital-acetaminophen-caffeine (FIORICET) 50-325-40 MG tablet Take 1 tablet by mouth 2 (two) times daily. 10/25/18   [provider]  Calcium Carb-Cholecalciferol (CALCIUM 600 + D) 600-200 MG-UNIT TABS Take 1 tablet by mouth every evening.    [provider]  carboxymethylcellulose (REFRESH PLUS) 0.5 % SOLN Place 1 drop into both eyes as needed (for dry eyes). 10/10/18   Medina-Vargas, Monina C, NP  cetirizine (ZYRTEC) 10 MG tablet Take 1 tablet (10 mg total) by mouth daily. 10/10/18   Medina-Vargas, Monina C, NP  Cholecalciferol (VITAMIN D-3) 5000 units TABS Take 5,000 Units by mouth daily.    [provider]  ferrous sulfate 325 (65 FE) MG tablet Take 325 mg by mouth daily with breakfast.    [provider]  gabapentin (NEURONTIN) 300 MG capsule 300 mg in the morning, 300 mg in the afternoon, 900 mg in the evening Patient taking differently: Take 300-900 mg by mouth 3 (three) times daily. 300 mg in the morning, 300 mg in the afternoon, 900 mg in the evening 10/10/18   Medina-Vargas, Monina C, NP  Garlic 2671 MG CAPS Take 1 capsule by mouth daily.  09/18/18   [provider]  glucose 4 GM chewable tablet Chew 1 tablet (4 g total) by mouth as needed for low blood sugar. <65 10/10/18   Medina-Vargas,  Monina C, NP  HYDROcodone-acetaminophen (NORCO/VICODIN) 5-325 MG tablet Take 1-2 tablets by mouth every 4 (four) hours as needed for moderate pain (pain score 4-6). 12/10/18   Sainani, Belia Heman, MD  insulin aspart (NOVOLOG FLEXPEN) 100 UNIT/ML FlexPen Inject 0-15 Units into the skin 3 (three) times daily with meals. Inject 0-15 units subcutaneous per sliding scale before meals and at bedtime 0 - 100 units = 0 units 101 - 120 = 1 unit 121 - 150 = 2 units 151 - 200 = 4 units 201 - 250 = 6 units 251 - 300 = 8 units 301 - 350 = 10 units 351 - 400 = 12 units 401 - 450 = 15 units > 450 call MD 10/10/18   Medina-Vargas, Monina C, NP  Insulin Glargine (LANTUS SOLOSTAR) 100 UNIT/ML Solostar Pen Inject 10 Units into the skin at bedtime. 12/10/18   Henreitta Leber, MD  losartan (COZAAR) 50 MG  tablet Take 1 tablet (50 mg total) by mouth daily for 30 days. 10/10/18 12/19/18  Medina-Vargas, Monina C, NP  Multiple Vitamins-Minerals (CEROVITE ADVANCED FORMULA PO) Take 1 tablet by mouth daily. 09/18/18   [provider]  Netarsudil-Latanoprost (ROCKLATAN) 0.02-0.005 % SOLN Apply 1 drop to eye at bedtime. Instill one drop in right eye at bedtime    [provider]  NON FORMULARY Diet type:  NCS 09/18/18   [provider]  polyethylene glycol (MIRALAX / GLYCOLAX) packet Take 17 g by mouth daily.     [provider]  promethazine (PHENERGAN) 25 MG suppository Place 25 mg rectally every 6 (six) hours as needed for nausea or vomiting.    [provider]  senna (SENOKOT) 8.6 MG TABS tablet Take 2 tablets by mouth 2 (two) times daily. 09/19/18   [provider]  venlafaxine (EFFEXOR) 75 MG tablet Take 1 tablet (75 mg total) by mouth 2 (two) times daily. 10/10/18   Medina-Vargas, Monina C, NP  vitamin C (ASCORBIC ACID) 500 MG tablet Take 500 mg by mouth 2 (two) times daily.    [provider]  warfarin (COUMADIN) 5 MG tablet Take 1 tablet (5 mg total) by mouth See  admin instructions. Managed by Dr. Merian Capron, Mon, Wed, Fri, Sat Patient taking differently: Take 5 mg by mouth See admin instructions. Take one tablet (5 mg) by mouth Monday, Wednesday, Friday, Saturday Sunday . Managed by Dr. Ouida Sills 10/10/18   Medina-Vargas, Monina C, NP  warfarin (COUMADIN) 6 MG tablet Take 1 tablet (6 mg total) by mouth daily. Once A Day on Tue, Thu 10/10/18   Medina-Vargas, Monina C, NP    Allergies Codeine; Meperidine; Metoclopramide hcl; Oxycodone; Stadol [butorphanol]; Tussionex pennkinetic er [employer] polst-cpm polst er]; and Carbocaine  [mepivacaine hcl]  Family History  Problem Relation Age of Onset  . Hypertension Mother   . Stroke Mother   . Heart failure Father     Social History Social History   Tobacco Use  . Smoking status: Never Smoker  . Smokeless tobacco: Never Used  Substance Use Topics  . Alcohol use: No  . Drug use: No    Review of Systems  Constitutional: Negative for fever. Eyes: Negative for visual changes. ENT: Negative for sore throat. Cardiovascular: Negative for chest pain. Respiratory: Negative for shortness of breath. Gastrointestinal: Negative for abdominal pain, vomiting and diarrhea. Genitourinary: Negative for dysuria. Musculoskeletal: Negative for back pain.  Hand pain as above. Skin: Negative for rash. Neurological: Negative for headaches, focal weakness or numbness. ____________________________________________  PHYSICAL EXAM:  VITAL SIGNS: ED Triage Vitals  Enc Vitals Group     BP 01/03/19 1620 138/63     Pulse Rate 01/03/19 1620 95     Resp 01/03/19 1620 16     Temp 01/03/19 1620 98.4 F (36.9 C)     Temp Source 01/03/19 1620 Oral     SpO2 01/03/19 1620 95 %     Weight 01/03/19 1632 102 lb (46.3 kg)     Height 01/03/19 1632 5\' 2"  (1.575 m)     Head Circumference --      Peak Flow --      Pain Score 01/03/19 1701 7     Pain Loc --      Pain Edu? --      Excl. in [place]? --     Constitutional: Alert  and oriented. Well appearing and in no distress. Head: Normocephalic and atraumatic. Eyes: Conjunctivae are normal. Normal  extraocular movements Cardiovascular: Normal rate, regular rhythm. Normal distal pulses and capillary refill noted. Respiratory: Normal respiratory effort. No wheezes/rales/rhonchi. Musculoskeletal: Right hand without obvious deformity, but subtle soft tissue swelling and bruising noted to the dorsum and the palm of the hand.  Patient is able demonstrate normal flexion and extension range of the fingers.  Nontender with normal range of motion in all extremities.  Neurologic: Normal gross sensation. Normal speech and language. No gross focal neurologic deficits are appreciated. Skin:  Skin is warm, dry and intact. No rash noted. Psychiatric: Mood and affect are normal. Patient exhibits appropriate insight and judgment. ____________________________________________  LABORATORY  Labs Reviewed  GLUCOSE, CAPILLARY - Abnormal; Notable for the following components:      Result Value   Glucose-Capillary 245 (*)    All other components within normal limits  CBG MONITORING, ED   ____________________________________________  RADIOLOGY  Right Hand  Non-displaced oblique fracture of the metacarpal of the 2nd digit.   I, Melvenia Needles, personally viewed and evaluated these images (plain radiographs) as part of my medical decision making, as well as reviewing the written report by the radiologist. ____________________________________________  PROCEDURES Norco 5-325 mg PO  .Splint Application Date/Time: 5/68/1275 8:17 PM Performed by: Melvenia Needles, PA-C Authorized by: Melvenia Needles, PA-C   Consent:    Consent obtained:  Verbal   Consent given by:  Patient   Risks discussed:  Pain and swelling   Alternatives discussed:  Alternative treatment Pre-procedure details:    Sensation:  Normal   Skin color:  Pink & ecchymotic (bruised) Procedure  details:    Laterality:  Right   Location:  Hand   Hand:  R hand   Splint type:  Finger   Supplies:  Elastic bandage, Ortho-Glass and cotton padding Post-procedure details:    Pain:  Improved   Sensation:  Normal   Skin color:  Pink & eechymotic (bruised)   Patient tolerance of procedure:  Tolerated well, no immediate complications   ____________________________________________  INITIAL IMPRESSION / ASSESSMENT AND PLAN / ED COURSE  Erin Daniels was evaluated in Emergency Department on 01/03/2019 for the symptoms described in the history of present illness. She was evaluated in the context of the global COVID-19 pandemic, which necessitated consideration that the patient might be at risk for infection with the SARS-CoV-2 virus that causes COVID-19. Institutional protocols and algorithms that pertain to the evaluation of patients at risk for COVID-19 are in a state of rapid change based on information released by regulatory bodies including the CDC and federal and state organizations. These policies and algorithms were followed during the patient's care in the ED.  Patient with initial fracture management of a right metacarpal fracture sustained following mechanical fall 1 day prior.  Patient is placed in appropriate splint to the palm of the hand.  She is referred to Dr. Posey Pronto at this time for further fracture management.  She will take her home medications including Tylenol as needed for pain relief.  She will follow-up as directed return to the ED as needed. ____________________________________________  FINAL CLINICAL IMPRESSION(S) / ED DIAGNOSES  Final diagnoses:  Closed nondisplaced fracture of shaft of second metacarpal bone of right hand, initial encounter      Melvenia Needles, PA-C 01/03/19 2022    Schuyler Amor, MD 01/03/19 2203

## 2019-01-03 NOTE — ED Triage Notes (Signed)
Pt via ems from home with swelling and discoloration to her right arm/wrist/hand. Pt fell yesterday. Pt states it was swollen "a little bit" after the fall yesterday and that it has increased today. Pt alert & oriented with NAD noted.

## 2019-01-03 NOTE — ED Notes (Signed)
Pt verbalizes understand  of discharge instructions

## 2019-01-08 ENCOUNTER — Encounter
Admission: RE | Admit: 2019-01-08 | Discharge: 2019-01-08 | Disposition: A | Discharge status: Home / Self Care | Payer: Medicare Other | Source: Ambulatory Visit | Attending: Surgery | Admitting: Surgery

## 2019-01-08 ENCOUNTER — Other Ambulatory Visit: Payer: Self-pay

## 2019-01-08 DIAGNOSIS — W19XXXA Unspecified fall, initial encounter: Secondary | ICD-10-CM | POA: Diagnosis not present

## 2019-01-08 DIAGNOSIS — Y93K9 Activity, other involving animal care: Secondary | ICD-10-CM | POA: Diagnosis not present

## 2019-01-08 DIAGNOSIS — Z7982 Long term (current) use of aspirin: Secondary | ICD-10-CM | POA: Diagnosis not present

## 2019-01-08 DIAGNOSIS — E1140 Type 2 diabetes mellitus with diabetic neuropathy, unspecified: Secondary | ICD-10-CM | POA: Diagnosis not present

## 2019-01-08 DIAGNOSIS — E1143 Type 2 diabetes mellitus with diabetic autonomic (poly)neuropathy: Secondary | ICD-10-CM | POA: Diagnosis not present

## 2019-01-08 DIAGNOSIS — S62320A Displaced fracture of shaft of second metacarpal bone, right hand, initial encounter for closed fracture: Secondary | ICD-10-CM | POA: Diagnosis not present

## 2019-01-08 DIAGNOSIS — Z7902 Long term (current) use of antithrombotics/antiplatelets: Secondary | ICD-10-CM | POA: Diagnosis not present

## 2019-01-08 DIAGNOSIS — Z794 Long term (current) use of insulin: Secondary | ICD-10-CM | POA: Diagnosis not present

## 2019-01-08 DIAGNOSIS — Z7901 Long term (current) use of anticoagulants: Secondary | ICD-10-CM | POA: Diagnosis not present

## 2019-01-08 DIAGNOSIS — E113591 Type 2 diabetes mellitus with proliferative diabetic retinopathy without macular edema, right eye: Secondary | ICD-10-CM | POA: Diagnosis not present

## 2019-01-08 DIAGNOSIS — E7800 Pure hypercholesterolemia, unspecified: Secondary | ICD-10-CM | POA: Diagnosis not present

## 2019-01-08 DIAGNOSIS — Z79899 Other long term (current) drug therapy: Secondary | ICD-10-CM | POA: Diagnosis not present

## 2019-01-08 DIAGNOSIS — K3184 Gastroparesis: Secondary | ICD-10-CM | POA: Diagnosis not present

## 2019-01-08 DIAGNOSIS — I10 Essential (primary) hypertension: Secondary | ICD-10-CM | POA: Diagnosis not present

## 2019-01-08 DIAGNOSIS — Z8673 Personal history of transient ischemic attack (TIA), and cerebral infarction without residual deficits: Secondary | ICD-10-CM | POA: Diagnosis not present

## 2019-01-08 DIAGNOSIS — Z1159 Encounter for screening for other viral diseases: Secondary | ICD-10-CM | POA: Insufficient documentation

## 2019-01-08 LAB — SARS CORONAVIRUS 2 BY RT PCR (HOSPITAL ORDER, PERFORMED IN ~~LOC~~ HOSPITAL LAB): SARS Coronavirus 2: NEGATIVE

## 2019-01-08 MED ORDER — CEFAZOLIN SODIUM-DEXTROSE 2-4 GM/100ML-% IV SOLN
2.0000 g | Freq: Once | INTRAVENOUS | Status: AC
  Administered 2019-01-09: 14:00:00 2 g via INTRAVENOUS

## 2019-01-08 NOTE — Patient Instructions (Signed)
Your procedure is scheduled [id number] Report to Day Surgery. [facility] at 11:45  Remember: Instructions that are not followed completely may result in serious medical risk,  up to and including death, or upon the discretion of your surgeon and anesthesiologist your  surgery may need to be rescheduled.     _X__ 1. Do not eat food after midnight the night before your procedure.                 No gum chewing or hard candies. You may drink clear liquids up to 2 hours                 before you are scheduled to arrive for your surgery- DO not drink clear                 liquids within 2 hours of the start of your surgery.                 Clear Liquids include:  Water,  __X__2.  On the morning of surgery brush your teeth with toothpaste and water, you                may rinse your mouth with mouthwash if you wish.  Do not swallow any toothpaste of mouthwash.     _X__ 3.  No Alcohol for 24 hours before or after surgery.   _X__ 4.  Do Not Smoke or use e-cigarettes For 24 Hours Prior to Your Surgery.                 Do not use any chewable tobacco products for at least 6 hours prior to                 surgery.  ____  5.  Bring all medications with you on the day of surgery if instructed.   __x__  6.  Notify your doctor if there is any change in your medical condition      (cold, fever, infections).     Do not wear jewelry, make-up, hairpins, clips or nail polish. Do not wear lotions, powders, or perfumes. You may wear deodorant. Do not shave 48 hours prior to surgery. Men may shave face and neck. Do not bring valuables to the hospital.    [facility] is not responsible for any belongings or valuables.  Contacts, dentures or bridgework may not be worn into surgery. Leave your suitcase in the car. After surgery it may be brought to your room. For patients admitted to the hospital, discharge time is determined by your treatment team.   Patients discharged the  day of surgery will not be allowed to drive home.   Please read over the following fact sheets that you were given:   _x__ Take these medicines the morning of surgery with A SIP OF WATER:    1. Pain medication if neeed  2. brimonidine-timolol (COMBIGAN) 0.2-0.5 % ophthalmic solution  3. gabapentin (NEURONTIN) 300 MG capsule  4.venlafaxine (EFFEXOR) 75 MG tablet  5.  6.  ____ Fleet Enema (as directed)   __x__ Use CHG Soap as directed  ____ Use inhalers on the day of surgery  ____ Stop metformin 2 days prior to surgery    __x__ Basal rate only on insulin pump.  No extra dosing in the morning.  Half dose tonight if your pump prompts you to administer a dose  __x__ Already stopped Plavix on 5/17   ____ Stop Anti-inflammatories on  __x__ Stop supplements until after surgery.    ____ Bring C-Pap to the hospital.

## 2019-01-09 ENCOUNTER — Ambulatory Visit: Payer: Medicare Other | Admitting: Certified Registered"

## 2019-01-09 ENCOUNTER — Encounter: Payer: Self-pay | Admitting: *Deleted

## 2019-01-09 ENCOUNTER — Ambulatory Visit
Admission: RE | Admit: 2019-01-09 | Discharge: 2019-01-09 | Disposition: A | Discharge status: Home / Self Care | Payer: Medicare Other | Attending: Surgery | Admitting: Surgery

## 2019-01-09 ENCOUNTER — Other Ambulatory Visit: Payer: Self-pay

## 2019-01-09 ENCOUNTER — Encounter
Admission: RE | Disposition: A | Discharge status: Home / Self Care | Payer: Self-pay | Source: Home / Self Care | Attending: Surgery

## 2019-01-09 DIAGNOSIS — Z8673 Personal history of transient ischemic attack (TIA), and cerebral infarction without residual deficits: Secondary | ICD-10-CM | POA: Insufficient documentation

## 2019-01-09 DIAGNOSIS — Z7902 Long term (current) use of antithrombotics/antiplatelets: Secondary | ICD-10-CM | POA: Insufficient documentation

## 2019-01-09 DIAGNOSIS — Z7982 Long term (current) use of aspirin: Secondary | ICD-10-CM | POA: Insufficient documentation

## 2019-01-09 DIAGNOSIS — I10 Essential (primary) hypertension: Secondary | ICD-10-CM | POA: Insufficient documentation

## 2019-01-09 DIAGNOSIS — S62320A Displaced fracture of shaft of second metacarpal bone, right hand, initial encounter for closed fracture: Secondary | ICD-10-CM | POA: Insufficient documentation

## 2019-01-09 DIAGNOSIS — W19XXXA Unspecified fall, initial encounter: Secondary | ICD-10-CM | POA: Insufficient documentation

## 2019-01-09 DIAGNOSIS — Z79899 Other long term (current) drug therapy: Secondary | ICD-10-CM | POA: Insufficient documentation

## 2019-01-09 DIAGNOSIS — E1143 Type 2 diabetes mellitus with diabetic autonomic (poly)neuropathy: Secondary | ICD-10-CM | POA: Insufficient documentation

## 2019-01-09 DIAGNOSIS — K3184 Gastroparesis: Secondary | ICD-10-CM | POA: Insufficient documentation

## 2019-01-09 DIAGNOSIS — Z794 Long term (current) use of insulin: Secondary | ICD-10-CM | POA: Insufficient documentation

## 2019-01-09 DIAGNOSIS — Z1159 Encounter for screening for other viral diseases: Secondary | ICD-10-CM | POA: Insufficient documentation

## 2019-01-09 DIAGNOSIS — E7800 Pure hypercholesterolemia, unspecified: Secondary | ICD-10-CM | POA: Insufficient documentation

## 2019-01-09 DIAGNOSIS — E113591 Type 2 diabetes mellitus with proliferative diabetic retinopathy without macular edema, right eye: Secondary | ICD-10-CM | POA: Insufficient documentation

## 2019-01-09 DIAGNOSIS — Y93K9 Activity, other involving animal care: Secondary | ICD-10-CM | POA: Insufficient documentation

## 2019-01-09 DIAGNOSIS — E1140 Type 2 diabetes mellitus with diabetic neuropathy, unspecified: Secondary | ICD-10-CM | POA: Insufficient documentation

## 2019-01-09 DIAGNOSIS — Z7901 Long term (current) use of anticoagulants: Secondary | ICD-10-CM | POA: Insufficient documentation

## 2019-01-09 HISTORY — PX: OPEN REDUCTION INTERNAL FIXATION (ORIF) METACARPAL: SHX6234

## 2019-01-09 LAB — GLUCOSE, CAPILLARY
Glucose-Capillary: 167 mg/dL — ABNORMAL HIGH (ref 70–99)
Glucose-Capillary: 131 mg/dL — ABNORMAL HIGH (ref 70–99)

## 2019-01-09 SURGERY — OPEN REDUCTION INTERNAL FIXATION (ORIF) METACARPAL
Anesthesia: Choice | Laterality: Right

## 2019-01-09 MED ORDER — LIDOCAINE HCL (CARDIAC) PF 100 MG/5ML IV SOSY
PREFILLED_SYRINGE | INTRAVENOUS | Status: DC | PRN
  Administered 2019-01-09: 50 mg via INTRAVENOUS

## 2019-01-09 MED ORDER — POTASSIUM CHLORIDE IN NACL 20-0.9 MEQ/L-% IV SOLN: INTRAVENOUS | Status: DC

## 2019-01-09 MED ORDER — FENTANYL CITRATE (PF) 100 MCG/2ML IJ SOLN
INTRAMUSCULAR | Status: AC
  Filled 2019-01-09: qty 2

## 2019-01-09 MED ORDER — ONDANSETRON HCL 4 MG/2ML IJ SOLN
INTRAMUSCULAR | Status: DC | PRN
  Administered 2019-01-09: 4 mg via INTRAVENOUS

## 2019-01-09 MED ORDER — FAMOTIDINE 20 MG PO TABS
20.0000 mg | ORAL_TABLET | Freq: Once | ORAL | Status: AC
  Administered 2019-01-09: 20 mg via ORAL

## 2019-01-09 MED ORDER — EPHEDRINE SULFATE 50 MG/ML IJ SOLN
INTRAMUSCULAR | Status: DC | PRN
  Administered 2019-01-09: 5 mg via INTRAVENOUS

## 2019-01-09 MED ORDER — METOCLOPRAMIDE HCL 10 MG PO TABS: 5.0000 mg | ORAL_TABLET | Freq: Three times a day (TID) | ORAL | Status: DC | PRN

## 2019-01-09 MED ORDER — SODIUM CHLORIDE 0.9 % IV SOLN
INTRAVENOUS | Status: DC
  Administered 2019-01-09: 12:00:00 via INTRAVENOUS

## 2019-01-09 MED ORDER — OXYCODONE HCL 5 MG PO TABS: 5.0000 mg | ORAL_TABLET | ORAL | Status: DC | PRN

## 2019-01-09 MED ORDER — FAMOTIDINE 20 MG PO TABS
ORAL_TABLET | ORAL | Status: AC
  Filled 2019-01-09: qty 1

## 2019-01-09 MED ORDER — CEFAZOLIN SODIUM-DEXTROSE 2-4 GM/100ML-% IV SOLN
INTRAVENOUS | Status: AC
  Filled 2019-01-09: qty 100

## 2019-01-09 MED ORDER — LACTATED RINGERS IV SOLN: INTRAVENOUS | Status: DC | PRN

## 2019-01-09 MED ORDER — BUPIVACAINE HCL (PF) 0.5 % IJ SOLN
INTRAMUSCULAR | Status: DC | PRN
  Administered 2019-01-09: 10 mL

## 2019-01-09 MED ORDER — FENTANYL CITRATE (PF) 100 MCG/2ML IJ SOLN
INTRAMUSCULAR | Status: DC | PRN
  Administered 2019-01-09 (×2): 50 ug via INTRAVENOUS

## 2019-01-09 MED ORDER — METOCLOPRAMIDE HCL 5 MG/ML IJ SOLN: 5.0000 mg | Freq: Three times a day (TID) | INTRAMUSCULAR | Status: DC | PRN

## 2019-01-09 MED ORDER — FENTANYL CITRATE (PF) 100 MCG/2ML IJ SOLN
12.5000 ug | INTRAMUSCULAR | Status: AC
  Administered 2019-01-09 (×2): 12.5 ug via INTRAVENOUS

## 2019-01-09 MED ORDER — PHENYLEPHRINE HCL (PRESSORS) 10 MG/ML IV SOLN
INTRAVENOUS | Status: DC | PRN
  Administered 2019-01-09: 100 ug via INTRAVENOUS

## 2019-01-09 MED ORDER — PROPOFOL 10 MG/ML IV BOLUS
INTRAVENOUS | Status: DC | PRN
  Administered 2019-01-09: 120 mg via INTRAVENOUS

## 2019-01-09 MED ORDER — ONDANSETRON HCL 4 MG PO TABS: 4.0000 mg | ORAL_TABLET | Freq: Four times a day (QID) | ORAL | Status: DC | PRN

## 2019-01-09 MED ORDER — ONDANSETRON HCL 4 MG/2ML IJ SOLN: 4.0000 mg | Freq: Four times a day (QID) | INTRAMUSCULAR | Status: DC | PRN

## 2019-01-09 MED ORDER — OXYCODONE HCL 5 MG PO TABS: 5.0000 mg | ORAL_TABLET | ORAL | 0 refills | Status: DC | PRN

## 2019-01-09 MED ORDER — PROPOFOL 10 MG/ML IV BOLUS
INTRAVENOUS | Status: AC
  Filled 2019-01-09: qty 20

## 2019-01-09 SURGICAL SUPPLY — 52 items
BANDAGE ACE 3X5.8 VEL STRL LF (GAUZE/BANDAGES/DRESSINGS) ×3 IMPLANT
BANDAGE ACE 4X5 VEL STRL LF (GAUZE/BANDAGES/DRESSINGS) ×3 IMPLANT
BIT DRILL 1.1 (BIT) ×1
BIT DRILL 1.1MM (BIT) ×1
BIT DRILL 60X20X1.1XQC TMX (BIT) IMPLANT
BIT DRL 60X20X1.1XQC TMX (BIT) ×1
BNDG COHESIVE 4X5 TAN STRL (GAUZE/BANDAGES/DRESSINGS) ×2 IMPLANT
BNDG CONFORM 3 STRL LF (GAUZE/BANDAGES/DRESSINGS) ×3 IMPLANT
BNDG ESMARK 4X12 TAN STRL LF (GAUZE/BANDAGES/DRESSINGS) ×3 IMPLANT
CANISTER SUCT 1200ML W/VALVE (MISCELLANEOUS) ×3 IMPLANT
CAST PADDING 3X4FT ST 30246 (SOFTGOODS) ×6
CHLORAPREP W/TINT 26 (MISCELLANEOUS) ×3 IMPLANT
CLOSURE WOUND 1/4X4 (GAUZE/BANDAGES/DRESSINGS) ×1
CORD BIP STRL DISP 12FT (MISCELLANEOUS) ×3 IMPLANT
COVER WAND RF STERILE (DRAPES) ×3 IMPLANT
CUFF TOURN DUAL PL 12 NO SLV (MISCELLANEOUS) ×2 IMPLANT
CUFF TOURN SGL QUICK 18X4 (TOURNIQUET CUFF) IMPLANT
DRAPE FLUOR MINI C-ARM 54X84 (DRAPES) ×3 IMPLANT
DRIVER BIT 1.5 (TRAUMA) ×4 IMPLANT
FORCEPS JEWEL BIP 4-3/4 STR (INSTRUMENTS) ×3 IMPLANT
GAUZE SPONGE 4X4 12PLY STRL (GAUZE/BANDAGES/DRESSINGS) ×3 IMPLANT
GAUZE XEROFORM 1X8 LF (GAUZE/BANDAGES/DRESSINGS) ×3 IMPLANT
GLOVE BIO SURGEON STRL SZ8 (GLOVE) ×6 IMPLANT
GLOVE INDICATOR 8.0 STRL GRN (GLOVE) ×3 IMPLANT
GOWN STRL REUS W/ TWL LRG LVL3 (GOWN DISPOSABLE) ×1 IMPLANT
GOWN STRL REUS W/ TWL XL LVL3 (GOWN DISPOSABLE) ×1 IMPLANT
GOWN STRL REUS W/TWL LRG LVL3 (GOWN DISPOSABLE) ×2
GOWN STRL REUS W/TWL XL LVL3 (GOWN DISPOSABLE) ×2
KIT TURNOVER KIT A (KITS) ×3 IMPLANT
NDL HYPO 25X1 1.5 SAFETY (NEEDLE) ×1 IMPLANT
NEEDLE HYPO 25X1 1.5 SAFETY (NEEDLE) ×3 IMPLANT
NS IRRIG 500ML POUR BTL (IV SOLUTION) ×3 IMPLANT
PACK EXTREMITY ARMC (MISCELLANEOUS) ×3 IMPLANT
PAD CAST CTTN 3X4 STRL (SOFTGOODS) ×3 IMPLANT
PASSER SUT SWANSON 36MM LOOP (INSTRUMENTS) ×3 IMPLANT
PLATE STRAIGHT LOCK 1.5 (Plate) ×2 IMPLANT
SCREW L 1.5X12 (Screw) ×4 IMPLANT
SCREW LOCKING 1.5X11MM (Screw) ×2 IMPLANT
SCREW LOCKING 1.5X13MM (Screw) ×2 IMPLANT
SCREW NL 1.5X12 (Screw) ×2 IMPLANT
SCREW NONIOC 1.5 10M (Screw) ×2 IMPLANT
SPLINT CAST 1 STEP 3X12 (MISCELLANEOUS) ×2 IMPLANT
STOCKINETTE 48X4 2 PLY STRL (GAUZE/BANDAGES/DRESSINGS) ×1 IMPLANT
STOCKINETTE IMPERVIOUS 9X36 MD (GAUZE/BANDAGES/DRESSINGS) ×2 IMPLANT
STOCKINETTE STRL 4IN 9604848 (GAUZE/BANDAGES/DRESSINGS) ×3 IMPLANT
STRIP CLOSURE SKIN 1/4X4 (GAUZE/BANDAGES/DRESSINGS) ×2 IMPLANT
SUT PROLENE 4 0 PS 2 18 (SUTURE) ×3 IMPLANT
SUT VIC AB 4-0 SH 27 (SUTURE) ×2
SUT VIC AB 4-0 SH 27XANBCTRL (SUTURE) ×1 IMPLANT
SWABSTK COMLB BENZOIN TINCTURE (MISCELLANEOUS) ×3 IMPLANT
SYR 10ML LL (SYRINGE) ×3 IMPLANT
WIRE Z .062 C-WIRE SPADE TIP (WIRE) ×6 IMPLANT

## 2019-01-09 NOTE — Discharge Instructions (Addendum)
Orthopedic discharge instructions: Keep splint dry and intact. Keep hand elevated above heart level. Apply ice to affected area frequently. Take ibuprofen 600 mg TID with meals for 7-10 days, then as necessary. Take pain medication as prescribed or ES Tylenol when needed.  Return for follow-up in 10-14 days or as scheduled.  AMBULATORY SURGERY  DISCHARGE INSTRUCTIONS   1) The drugs that you were given will stay in your system until tomorrow so for the next 24 hours you should not:  A) Drive an automobile B) Make any legal decisions C) Drink any alcoholic beverage   2) You may resume regular meals tomorrow.  Today it is better to start with liquids and gradually work up to solid foods.  You may eat anything you prefer, but it is better to start with liquids, then soup and crackers, and gradually work up to solid foods.   3) Please notify your doctor immediately if you have any unusual bleeding, trouble breathing, redness and pain at the surgery site, drainage, fever, or pain not relieved by medication.    4) Additional Instructions:        Please contact your physician with any problems or Same Day Surgery at [phone], Monday through Friday 6 am to 4 pm, or [place] [facility] [place] Main number at [phone]. 

## 2019-01-09 NOTE — Anesthesia Procedure Notes (Signed)
Procedure Name: LMA Insertion Date/Time: 01/09/2019 1:40 PM Performed by: Chanetta Marshall, CRNA Pre-anesthesia Checklist: Patient identified, Emergency Drugs available and Suction available Patient Re-evaluated:Patient Re-evaluated prior to induction Oxygen Delivery Method: Circle system utilized Preoxygenation: Pre-oxygenation with 100% oxygen Induction Type: IV induction LMA: LMA inserted LMA Size: 3.0 Number of attempts: 1 Placement Confirmation: positive ETCO2,  CO2 detector and breath sounds checked- equal and bilateral Tube secured with: Tape Dental Injury: Teeth and Oropharynx as per pre-operative assessment

## 2019-01-09 NOTE — Anesthesia Post-op Follow-up Note (Signed)
Anesthesia QCDR form completed.        

## 2019-01-09 NOTE — Op Note (Signed)
01/09/2019  3:10 PM  Patient:   Erin Daniels  Pre-Op Diagnosis:   Closed displaced oblique midshaft fracture, right index metacarpal.  Post-Op Diagnosis:   Same  Procedure:   Open reduction and internal fixation of right index metacarpal shaft fracture.  Surgeon:   Pascal Lux, MD  Assistant:   None  Anesthesia:   General LMA  Findings:   As above.  Complications:   None  Fluids:   900 cc crystalloid  EBL:   None  UOP:   None  TT:   56 minutes at 250 mmHg  Drains:   None  Closure:   4-0 Prolene interrupted sutures  Implants:   Biomet 1.5 mm straight plate with two nonlocking screws and four locking screws  Brief Clinical Note:   The patient is a 73 year old female who sustained the above-noted injury 6 days ago when she apparently fell in her home.  X-rays in the emergency room demonstrated the above-noted injury.  She presents at this time for definitive management of this injury.  Procedure:   The patient was brought into the operating room and lain in the supine position.  After adequate general laryngeal mask anesthesia was obtained, the patient's right hand and upper extremity were prepped with ChloraPrep solution before being draped sterilely.  Preoperative antibiotics were administered.  A timeout was performed to verify the appropriate surgical site before the limb was exsanguinated with an Esmarch and the tourniquet inflated to 250 mmHg.  An approximately 3 to 3.5 cm incision was made along the radial aspect of the index metacarpal.  The incision was carried down through the subcutaneous tissues to visualize the fracture.  The soft tissues were elevated off the dorsal and volar surfaces of the metacarpal shaft to better visualize the fracture site.    The fracture was reduced and temporarily secured using a K wire.  A straight 1.5 mm plate was selected and cut to the appropriate length.  It was then secured to the shaft temporarily using 0.035 K wires.  The  adequacy of plate position was verified using FluoroScan imaging in AP and lateral projections.  A nonlocking screw was placed proximal to the fracture and a second nonlocking screw placed distal to the fracture to pull the plate to the bone.  Two additional locking screws were placed proximally and two additional locking screws placed distally to complete fixation of the fracture.  The adequacy of fracture reduction and hardware position was verified using FluoroScan imaging in AP, lateral, and oblique projections and found to be satisfactory.  The wound was copiously irrigated with sterile saline solution using bulb irrigation before the deeper fascial tissues were reapproximated using 4-0 Vicryl interrupted sutures.  The skin was closed using 4-0 Prolene interrupted sutures.  A total of 10 cc of 0.5% plain Sensorcaine was injected in and around the incision to help with postoperative analgesia before a sterile bulky dressing was applied to the hand, buddy taping the index and long fingers.  A volar splint was applied, maintaining the hand in a position of function.  The patient was then awakened, extubated, and returned to the recovery room in satisfactory condition after tolerating the procedure well.

## 2019-01-09 NOTE — Transfer of Care (Signed)
Immediate Anesthesia Transfer of Care Note  Patient: Erin Daniels  Procedure(s) Performed: OPEN REDUCTION INTERNAL FIXATION (ORIF) RIGHT SECOND METACARPAL FRACTURE (Right )  Patient Location: PACU  Anesthesia Type:General  Level of Consciousness: awake, alert  and oriented  Airway & Oxygen Therapy: Patient Spontanous Breathing and Patient connected to face mask oxygen  Post-op Assessment: Report given to RN and Post -op Vital signs reviewed and stable  Post vital signs: Reviewed and stable  Last Vitals:  Vitals Value Taken Time  BP    Temp    Pulse    Resp 16 01/09/2019  3:04 PM  SpO2    Vitals shown include unvalidated device data.  Last Pain:  Vitals:   01/09/19 1214  TempSrc: Oral  PainSc: 7          Complications: No apparent anesthesia complications

## 2019-01-09 NOTE — H&P (Signed)
Paper H&P to be scanned into permanent record. H&P reviewed and patient re-examined. No changes. 

## 2019-01-10 ENCOUNTER — Encounter: Payer: Self-pay | Admitting: Surgery

## 2019-01-17 ENCOUNTER — Other Ambulatory Visit: Payer: Self-pay | Admitting: Student in an Organized Health Care Education/Training Program

## 2019-01-20 NOTE — Anesthesia Postprocedure Evaluation (Signed)
Anesthesia Post Note  Patient: Erin Daniels  Procedure(s) Performed: OPEN REDUCTION INTERNAL FIXATION (ORIF) RIGHT SECOND METACARPAL FRACTURE (Right )  Patient location during evaluation: PACU Anesthesia Type: General Level of consciousness: awake and alert Pain management: pain level controlled Vital Signs Assessment: post-procedure vital signs reviewed and stable Respiratory status: spontaneous breathing, nonlabored ventilation, respiratory function stable and patient connected to nasal cannula oxygen Cardiovascular status: blood pressure returned to baseline and stable Postop Assessment: no apparent nausea or vomiting Anesthetic complications: no     Last Vitals:  Vitals:   01/09/19 1603 01/09/19 1620  BP: 128/62 (!) 121/56  Pulse: 76 78  Resp: 16 16  Temp:  36.7 C  SpO2: 96% 97%    Last Pain:  Vitals:   01/10/19 1644  TempSrc:   PainSc: 4                  Molli Barrows

## 2019-01-20 NOTE — Anesthesia Preprocedure Evaluation (Signed)
Anesthesia Evaluation  Patient identified by MRN, date of birth, ID band Patient awake    Reviewed: Allergy & Precautions, H&P , NPO status , Patient's Chart, lab work & pertinent test results, reviewed documented beta blocker date and time   History of Anesthesia Complications (+) history of anesthetic complications  Airway Mallampati: II   Neck ROM: full    Dental  (+) Poor Dentition   Pulmonary neg pulmonary ROS,    Pulmonary exam normal        Cardiovascular Exercise Tolerance: Poor hypertension, On Medications negative cardio ROS Normal cardiovascular exam Rhythm:regular Rate:Normal     Neuro/Psych  Headaches, Seizures -,  PSYCHIATRIC DISORDERS Anxiety Depression  Neuromuscular disease CVA    GI/Hepatic negative GI ROS, Neg liver ROS,   Endo/Other  diabetesHypothyroidism   Renal/GU negative Renal ROS  negative genitourinary   Musculoskeletal   Abdominal   Peds  Hematology  (+) Blood dyscrasia, anemia ,   Anesthesia Other Findings Past Medical History: No date: At high risk for falls No date: Basal cell carcinoma     Comment:  right upper leg No date: Complication of anesthesia     Comment:  difficult to arouse No date: Diabetes mellitus without complication (HCC)     Comment:  Type I No date: Diabetic neuropathy (HCC) No date: Diabetic retinopathy (HCC) No date: Dizziness No date: Gastroparesis No date: Herniated disc, cervical No date: Hypercholesteremia No date: Hypertension No date: Hypotensive episode No date: Hypothyroidism No date: Insulin pump in place No date: Myofascial muscle pain No date: Numbness of arm     Comment:  left No date: Osteoporosis No date: Poor vision No date: Seizures ([place]) No date: Slurred speech No date: 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 No date: Stroke [facility])     Comment:  x5 No date: Vertigo No date: Wears glasses Past Surgical History: No date:  APPENDECTOMY 2011: AUGMENTATION MAMMAPLASTY; Bilateral No date: BILATERAL CARPAL TUNNEL RELEASE No date: COLONOSCOPY No date: EYE SURGERY; Bilateral     Comment:  cataract No date: HEMORROIDECTOMY No date: HERNIA REPAIR     Comment:  umbilical 0/99/8338: INTRAMEDULLARY (IM) NAIL INTERTROCHANTERIC; Right     Comment:  Procedure: INTRAMEDULLARY (IM) NAIL INTERTROCHANTRIC;                Surgeon: Thornton Park, MD;  Location: [facility];                Service: Orthopedics;  Laterality: Right; No date: JOINT REPLACEMENT     Comment:  right hip 05/29/2015: OPEN REDUCTION INTERNAL FIXATION (ORIF) DISTAL RADIAL  FRACTURE; Right     Comment:  Procedure: OPEN REDUCTION INTERNAL FIXATION (ORIF) RIGHT              DISTAL RADIUS FRACTURE WITH ALLOGRAFT BONE GRAFTING FOR               REPAIR AND RECONSTRUCTION;  Surgeon: Roseanne Kaufman, MD;               Location: [place];  Service: Orthopedics;  Laterality:               Right; 01/09/2019: OPEN REDUCTION INTERNAL FIXATION (ORIF) METACARPAL; Right     Comment:  Procedure: OPEN REDUCTION INTERNAL FIXATION (ORIF) RIGHT              SECOND METACARPAL FRACTURE;  Surgeon: Corky Mull, MD;  Location: [facility];  Service: Orthopedics;  Laterality:               Right; No date: SHOULDER ARTHROSCOPY; Left No date: SHOULDER HEMI-ARTHROPLASTY; Left No date: SYNOVECTOMY No date: TONSILLECTOMY No date: TRIGGER FINGER RELEASE No date: TUBAL LIGATION BMI    Body Mass Index:  18.50 kg/m     Reproductive/Obstetrics negative OB ROS                             Anesthesia Physical Anesthesia Plan  ASA: III  Anesthesia Plan: General   Post-op Pain Management:    Induction:   PONV Risk Score and Plan:   Airway Management Planned:   Additional Equipment:   Intra-op Plan:   Post-operative Plan:   Informed Consent: I have reviewed the patients History and Physical, chart, labs and discussed the procedure  including the risks, benefits and alternatives for the proposed anesthesia with the patient or authorized representative who has indicated his/her understanding and acceptance.     Dental Advisory Given  Plan Discussed with: CRNA  Anesthesia Plan Comments:         Anesthesia Quick Evaluation

## 2019-02-13 ENCOUNTER — Encounter: Payer: Self-pay | Admitting: Student in an Organized Health Care Education/Training Program

## 2019-02-13 ENCOUNTER — Ambulatory Visit
Payer: Medicare Other | Attending: Student in an Organized Health Care Education/Training Program | Admitting: Student in an Organized Health Care Education/Training Program

## 2019-02-13 ENCOUNTER — Other Ambulatory Visit: Payer: Self-pay

## 2019-02-13 DIAGNOSIS — G894 Chronic pain syndrome: Secondary | ICD-10-CM | POA: Diagnosis not present

## 2019-02-13 DIAGNOSIS — E1042 Type 1 diabetes mellitus with diabetic polyneuropathy: Secondary | ICD-10-CM

## 2019-02-13 DIAGNOSIS — E108 Type 1 diabetes mellitus with unspecified complications: Secondary | ICD-10-CM

## 2019-02-13 DIAGNOSIS — G44229 Chronic tension-type headache, not intractable: Secondary | ICD-10-CM | POA: Diagnosis not present

## 2019-02-13 MED ORDER — BUTALBITAL-APAP-CAFFEINE 50-325-40 MG PO TABS: 1.0000 | ORAL_TABLET | Freq: Two times a day (BID) | ORAL | 4 refills | Status: DC | PRN

## 2019-02-13 NOTE — Progress Notes (Signed)
Pain Management Virtual Encounter Note - Virtual Visit via Telephone Telehealth (real-time audio visits between healthcare provider and patient).   Patient's Phone No. & Preferred Pharmacy:  [phone] (home); [phone] (mobile); (Preferred) [phone] [email or web address]  [place], [place] [address] Phone: [phone] Fax: [phone]  [facility] [place], [place] [address] [place] [address] Phone: [phone] Fax: [place] [id number], [place] [address] [place] [address] Phone: [phone] Fax: [phone], [place], [place] [address] Phone: [phone] Fax: [phone]  [facility] [place], [place] - [place] [address] [place] [address] [place] [address] Phone: [phone] Fax: [phone]    Pre-screening note:  Our staff contacted Ms. Rafuse and offered her an "in person", "face-to-face" appointment versus a telephone encounter. She indicated preferring the telephone encounter, at this time.   Reason for Virtual Visit: COVID-19*  Social distancing based on CDC and AMA recommendations.   I contacted Nariah Morgano Nimmons on 02/13/2019 via telephone.      I clearly identified myself as Gillis Santa, MD. I verified that I was speaking with the correct person using two identifiers (Name: CHARIE PINKUS, and date of birth: 1945-10-01).  Advanced Informed Consent I sought verbal advanced consent from [place] for virtual visit interactions. I informed Ms. Floren of possible security and privacy concerns, risks, and limitations associated with providing "not-in-person" medical  evaluation and management services. I also informed Ms. Connors of the availability of "in-person" appointments. Finally, I informed her that there would be a charge for the virtual visit and that she could be  personally, fully or partially, financially responsible for it. Ms. Allie expressed understanding and agreed to proceed.   Historic Elements   Ms. Aniston Christman Stoiber is a 73 y.o. year old, female patient evaluated today after her last encounter by our practice on 01/17/2019. Ms. Rieke  has a past medical history of At high risk for falls, Basal cell carcinoma, Complication of anesthesia, Diabetes mellitus without complication ([place]), Diabetic neuropathy ([place]), Diabetic retinopathy ([place]), Dizziness, Gastroparesis, Herniated disc, cervical, Hypercholesteremia, Hypertension, Hypotensive episode, Hypothyroidism, Insulin pump in place, Myofascial muscle pain, Numbness of arm, Osteoporosis, Poor vision, Seizures ([place]), Slurred speech, Stroke (cerebrum) ([place]), Stroke ([place]), Vertigo, and Wears glasses. She also  has a past surgical history that includes Tonsillectomy; Hemorroidectomy; Appendectomy; Tubal ligation; Trigger finger release; Bilateral carpal tunnel release; Synovectomy; Colonoscopy; Shoulder arthroscopy (Left); Shoulder hemi-arthroplasty (Left); Open reduction internal fixation (orif) distal radial fracture (Right, 05/29/2015); Augmentation mammaplasty (Bilateral, 2011); Intramedullary (im) nail intertrochanteric (Right, 12/07/2018); Eye surgery (Bilateral); Hernia repair; Joint replacement; and Open reduction internal fixation (orif) metacarpal (Right, 01/09/2019). Ms. Merkey has a current medication list which includes the following prescription(s): acetaminophen, aspirin ec, atorvastatin, brimonidine-timolol, butalbital-acetaminophen-caffeine, calcium carb-cholecalciferol, carboxymethylcellulose, cetirizine, vitamin d-3, clopidogrel, ferrous sulfate, gabapentin, glucose, insulin aspart,  multiple vitamins-minerals, netarsudil-latanoprost, NON FORMULARY, oxycodone, polyethylene glycol, promethazine, senna, venlafaxine, vitamin c, and losartan. She  reports that she has never smoked. She has never used smokeless tobacco. She reports that she does not drink alcohol or use drugs. Ms. Conerly is allergic to carbocaine [mepivacaine hcl]; tussionex pennkinetic er [hydrocod polst-cpm polst er]; codeine; meperidine;  metoclopramide hcl; and stadol [butorphanol].   HPI  Today, she is being contacted for medication management.  Patient is requesting refill of her Fioricet.  Since her last visit with me, patient has undergone open reduction and internal fixation of right index metacarpal shaft fracture.   She states that she is recovering from that.     Pertinent Labs   SAFETY SCREENING Profile Lab Results  Component Value Date   SARSCOV2NAA NEGATIVE 01/08/2019   MRSAPCR NEGATIVE 11/27/2016   Renal Function Lab Results  Component Value Date   BUN 32 (H) 12/19/2018   CREATININE 0.83 12/19/2018   GFRAA >60 12/19/2018   GFRNONAA >60 12/19/2018   Hepatic Function Lab Results  Component Value Date   AST 27 12/19/2018   ALT 19 12/19/2018   ALBUMIN 3.4 (L) 12/19/2018   UDS No results found for: SUMMARY Note: Above Lab results reviewed.  Recent imaging  DG Hand Complete Right CLINICAL DATA:  Right hand pain after a fall today. Initial encounter.  EXAM: RIGHT HAND - COMPLETE 3+ VIEW  COMPARISON:  Plain films right wrist 12/06/2018.  FINDINGS: The patient has an acute spiral fracture of the diaphysis of the second metacarpal. Distal fracture fragment demonstrates 1/2 shaft width medial displacement and approximately 1/2 shaft width volar displacement. No other acute bony abnormality is seen. Bones appear osteopenic. Plate and screw fixation of a remote distal radius fracture noted.  IMPRESSION: Acute mid diaphyseal fracture right second metacarpal as  described above.  Electronically Signed   By: Inge Rise M.D.   On: 01/03/2019 18:45  Assessment  The primary encounter diagnosis was Chronic tension-type headache, not intractable. Diagnoses of Diabetic polyneuropathy associated with type 1 diabetes mellitus ([place]), Type 1 diabetes mellitus with complication ([place]), and Chronic pain syndrome were also pertinent to this visit.  Plan of Care  I have changed Ersel Wadleigh. Countess's butalbital-acetaminophen-caffeine. I am also having her maintain her polyethylene glycol, vitamin C, Calcium Carb-Cholecalciferol, Vitamin D-3, acetaminophen, Multiple Vitamins-Minerals (CEROVITE ADVANCED FORMULA PO), senna, NON FORMULARY, atorvastatin, carboxymethylcellulose, cetirizine, insulin aspart, losartan, venlafaxine, glucose, gabapentin, Netarsudil-Latanoprost, brimonidine-timolol, ferrous sulfate, aspirin EC, promethazine, clopidogrel, and oxyCODONE.  Pharmacotherapy (Medications Ordered): Meds ordered this encounter  Medications  . butalbital-acetaminophen-caffeine (FIORICET) 50-325-40 MG tablet    Sig: Take 1 tablet by mouth 2 (two) times daily as needed for headache or migraine.    Dispense:  30 tablet    Refill:  4   Orders:  No orders of the defined types were placed in this encounter.  Follow-up plan:   Return if symptoms worsen or fail to improve.    Recent Visits Date Type Provider Dept  11/26/18 Office Visit Gillis Santa, MD [facility]  Showing recent visits within past 90 days and meeting all other requirements   Today's Visits Date Type Provider Dept  02/13/19 Office Visit Gillis Santa, MD [facility]  Showing today's visits and meeting all other requirements   Future Appointments No visits were found meeting these conditions.  Showing future appointments within next 90 days and meeting all other requirements   I discussed the assessment and treatment plan with the patient. The patient was provided an  opportunity to ask questions and all were answered. The patient agreed with the plan and demonstrated an understanding of the instructions.  Patient advised to call back or seek an in-person evaluation if the symptoms or condition worsens.  Total duration of non-face-to-face encounter: 15 minutes.  Note by: Gillis Santa, MD Date: 02/13/2019; Time:  11:24 AM  Note: This dictation was prepared with Dragon dictation. Any transcriptional errors that may result from this process are unintentional.  Disclaimer:  * Given the special circumstances of the COVID-19 pandemic, the federal government has announced that the Office for Civil Rights (OCR) will exercise its enforcement discretion and will not impose penalties on physicians using telehealth in the event of noncompliance with regulatory requirements under the [place] (HIPAA) in connection with the good faith provision of telehealth during the ZXYDS-89 national public health emergency. ([place])

## 2019-04-30 ENCOUNTER — Other Ambulatory Visit: Payer: Self-pay

## 2019-04-30 ENCOUNTER — Inpatient Hospital Stay: Payer: Medicare Other

## 2019-04-30 ENCOUNTER — Emergency Department: Payer: Medicare Other

## 2019-04-30 ENCOUNTER — Inpatient Hospital Stay
Admission: EM | Admit: 2019-04-30 | Discharge: 2019-05-01 | DRG: 087 | Disposition: A | Discharge status: Home / Self Care | Payer: Medicare Other | Attending: Internal Medicine | Admitting: Internal Medicine

## 2019-04-30 ENCOUNTER — Other Ambulatory Visit: Payer: Medicare Other

## 2019-04-30 ENCOUNTER — Encounter: Payer: Self-pay | Admitting: Emergency Medicine

## 2019-04-30 DIAGNOSIS — Z8673 Personal history of transient ischemic attack (TIA), and cerebral infarction without residual deficits: Secondary | ICD-10-CM

## 2019-04-30 DIAGNOSIS — Z7982 Long term (current) use of aspirin: Secondary | ICD-10-CM

## 2019-04-30 DIAGNOSIS — D638 Anemia in other chronic diseases classified elsewhere: Secondary | ICD-10-CM | POA: Diagnosis present

## 2019-04-30 DIAGNOSIS — I10 Essential (primary) hypertension: Secondary | ICD-10-CM | POA: Diagnosis present

## 2019-04-30 DIAGNOSIS — H548 Legal blindness, as defined in USA: Secondary | ICD-10-CM | POA: Diagnosis present

## 2019-04-30 DIAGNOSIS — G894 Chronic pain syndrome: Secondary | ICD-10-CM | POA: Diagnosis present

## 2019-04-30 DIAGNOSIS — I609 Nontraumatic subarachnoid hemorrhage, unspecified: Secondary | ICD-10-CM | POA: Diagnosis not present

## 2019-04-30 DIAGNOSIS — E1042 Type 1 diabetes mellitus with diabetic polyneuropathy: Secondary | ICD-10-CM | POA: Diagnosis present

## 2019-04-30 DIAGNOSIS — K3184 Gastroparesis: Secondary | ICD-10-CM | POA: Diagnosis present

## 2019-04-30 DIAGNOSIS — S066X9A Traumatic subarachnoid hemorrhage with loss of consciousness of unspecified duration, initial encounter: Secondary | ICD-10-CM | POA: Diagnosis present

## 2019-04-30 DIAGNOSIS — M810 Age-related osteoporosis without current pathological fracture: Secondary | ICD-10-CM | POA: Diagnosis present

## 2019-04-30 DIAGNOSIS — S0101XA Laceration without foreign body of scalp, initial encounter: Secondary | ICD-10-CM

## 2019-04-30 DIAGNOSIS — Z96612 Presence of left artificial shoulder joint: Secondary | ICD-10-CM | POA: Diagnosis present

## 2019-04-30 DIAGNOSIS — E103593 Type 1 diabetes mellitus with proliferative diabetic retinopathy without macular edema, bilateral: Secondary | ICD-10-CM | POA: Diagnosis present

## 2019-04-30 DIAGNOSIS — Z9641 Presence of insulin pump (external) (internal): Secondary | ICD-10-CM | POA: Diagnosis present

## 2019-04-30 DIAGNOSIS — W1830XA Fall on same level, unspecified, initial encounter: Secondary | ICD-10-CM | POA: Diagnosis present

## 2019-04-30 DIAGNOSIS — J302 Other seasonal allergic rhinitis: Secondary | ICD-10-CM | POA: Diagnosis present

## 2019-04-30 DIAGNOSIS — S066X0A Traumatic subarachnoid hemorrhage without loss of consciousness, initial encounter: Principal | ICD-10-CM | POA: Diagnosis present

## 2019-04-30 DIAGNOSIS — E1043 Type 1 diabetes mellitus with diabetic autonomic (poly)neuropathy: Secondary | ICD-10-CM | POA: Diagnosis present

## 2019-04-30 DIAGNOSIS — Z79899 Other long term (current) drug therapy: Secondary | ICD-10-CM

## 2019-04-30 DIAGNOSIS — Z888 Allergy status to other drugs, medicaments and biological substances status: Secondary | ICD-10-CM

## 2019-04-30 DIAGNOSIS — Z8249 Family history of ischemic heart disease and other diseases of the circulatory system: Secondary | ICD-10-CM

## 2019-04-30 DIAGNOSIS — E785 Hyperlipidemia, unspecified: Secondary | ICD-10-CM | POA: Diagnosis present

## 2019-04-30 DIAGNOSIS — Z96641 Presence of right artificial hip joint: Secondary | ICD-10-CM | POA: Diagnosis present

## 2019-04-30 DIAGNOSIS — E039 Hypothyroidism, unspecified: Secondary | ICD-10-CM | POA: Diagnosis present

## 2019-04-30 DIAGNOSIS — Z7902 Long term (current) use of antithrombotics/antiplatelets: Secondary | ICD-10-CM

## 2019-04-30 DIAGNOSIS — E7800 Pure hypercholesterolemia, unspecified: Secondary | ICD-10-CM | POA: Diagnosis present

## 2019-04-30 DIAGNOSIS — Z794 Long term (current) use of insulin: Secondary | ICD-10-CM | POA: Diagnosis not present

## 2019-04-30 DIAGNOSIS — W19XXXA Unspecified fall, initial encounter: Secondary | ICD-10-CM

## 2019-04-30 DIAGNOSIS — Z885 Allergy status to narcotic agent status: Secondary | ICD-10-CM

## 2019-04-30 DIAGNOSIS — Z20828 Contact with and (suspected) exposure to other viral communicable diseases: Secondary | ICD-10-CM | POA: Diagnosis present

## 2019-04-30 DIAGNOSIS — Z85828 Personal history of other malignant neoplasm of skin: Secondary | ICD-10-CM | POA: Diagnosis not present

## 2019-04-30 DIAGNOSIS — S066XAA Traumatic subarachnoid hemorrhage with loss of consciousness status unknown, initial encounter: Secondary | ICD-10-CM | POA: Diagnosis present

## 2019-04-30 LAB — URINALYSIS, COMPLETE (UACMP) WITH MICROSCOPIC
Bacteria, UA: NONE SEEN
Bilirubin Urine: NEGATIVE
Glucose, UA: NEGATIVE mg/dL
Hgb urine dipstick: NEGATIVE
Ketones, ur: NEGATIVE mg/dL
Nitrite: NEGATIVE
Protein, ur: NEGATIVE mg/dL
Specific Gravity, Urine: 1.018 (ref 1.005–1.030)
pH: 5 (ref 5.0–8.0)

## 2019-04-30 LAB — CBC WITH DIFFERENTIAL/PLATELET
Abs Immature Granulocytes: 0.07 10*3/uL (ref 0.00–0.07)
Basophils Absolute: 0 10*3/uL (ref 0.0–0.1)
Basophils Relative: 0 %
Eosinophils Absolute: 0.2 10*3/uL (ref 0.0–0.5)
Eosinophils Relative: 2 %
HCT: 33.4 % — ABNORMAL LOW (ref 36.0–46.0)
Hemoglobin: 10.9 g/dL — ABNORMAL LOW (ref 12.0–15.0)
Immature Granulocytes: 1 %
Lymphocytes Relative: 8 %
Lymphs Abs: 1 10*3/uL (ref 0.7–4.0)
MCH: 30.5 pg (ref 26.0–34.0)
MCHC: 32.6 g/dL (ref 30.0–36.0)
MCV: 93.6 fL (ref 80.0–100.0)
Monocytes Absolute: 1.6 10*3/uL — ABNORMAL HIGH (ref 0.1–1.0)
Monocytes Relative: 13 %
Neutro Abs: 9.7 10*3/uL — ABNORMAL HIGH (ref 1.7–7.7)
Neutrophils Relative %: 76 %
Platelets: 268 10*3/uL (ref 150–400)
RBC: 3.57 MIL/uL — ABNORMAL LOW (ref 3.87–5.11)
RDW: 12.7 % (ref 11.5–15.5)
WBC: 12.6 10*3/uL — ABNORMAL HIGH (ref 4.0–10.5)
nRBC: 0 % (ref 0.0–0.2)

## 2019-04-30 LAB — PREPARE PLATELET PHERESIS: Unit division: 0

## 2019-04-30 LAB — GLUCOSE, CAPILLARY
Glucose-Capillary: 182 mg/dL — ABNORMAL HIGH (ref 70–99)
Glucose-Capillary: 170 mg/dL — ABNORMAL HIGH (ref 70–99)
Glucose-Capillary: 361 mg/dL — ABNORMAL HIGH (ref 70–99)
Glucose-Capillary: 185 mg/dL — ABNORMAL HIGH (ref 70–99)

## 2019-04-30 LAB — COMPREHENSIVE METABOLIC PANEL
ALT: 16 U/L (ref 0–44)
AST: 22 U/L (ref 15–41)
Albumin: 4.3 g/dL (ref 3.5–5.0)
Alkaline Phosphatase: 80 U/L (ref 38–126)
Anion gap: 12 (ref 5–15)
BUN: 30 mg/dL — ABNORMAL HIGH (ref 8–23)
CO2: 27 mmol/L (ref 22–32)
Calcium: 9.8 mg/dL (ref 8.9–10.3)
Chloride: 102 mmol/L (ref 98–111)
Creatinine, Ser: 0.7 mg/dL (ref 0.44–1.00)
GFR calc Af Amer: >60 mL/min (ref >60)
GFR calc non Af Amer: >60 mL/min (ref >60)
Glucose, Bld: 178 mg/dL — ABNORMAL HIGH (ref 70–99)
Potassium: 3.9 mmol/L (ref 3.5–5.1)
Sodium: 141 mmol/L (ref 135–145)
Total Bilirubin: 0.4 mg/dL (ref 0.3–1.2)
Total Protein: 7.4 g/dL (ref 6.5–8.1)

## 2019-04-30 LAB — BPAM PLATELET PHERESIS
Blood Product Expiration Date: 202009112359
Unit Type and Rh: 5100

## 2019-04-30 LAB — TYPE AND SCREEN
ABO/RH(D): O POS
Antibody Screen: NEGATIVE

## 2019-04-30 LAB — APTT: aPTT: 31 seconds (ref 24–36)

## 2019-04-30 LAB — MAGNESIUM: Magnesium: 2.5 mg/dL — ABNORMAL HIGH (ref 1.7–2.4)

## 2019-04-30 LAB — PROTIME-INR
INR: 1 (ref 0.8–1.2)
Prothrombin Time: 12.9 seconds (ref 11.4–15.2)

## 2019-04-30 LAB — SARS CORONAVIRUS 2 BY RT PCR (HOSPITAL ORDER, PERFORMED IN ~~LOC~~ HOSPITAL LAB): SARS Coronavirus 2: NEGATIVE

## 2019-04-30 MED ORDER — IOHEXOL 350 MG/ML SOLN
75.0000 mL | Freq: Once | INTRAVENOUS | Status: AC | PRN
  Administered 2019-04-30: 75 mL via INTRAVENOUS

## 2019-04-30 MED ORDER — GABAPENTIN 300 MG PO CAPS
900.0000 mg | ORAL_CAPSULE | Freq: Every day | ORAL | Status: DC
  Administered 2019-04-30: 21:00:00 900 mg via ORAL
  Filled 2019-04-30: qty 3

## 2019-04-30 MED ORDER — LIDOCAINE-EPINEPHRINE 2 %-1:100000 IJ SOLN
20.0000 mL | Freq: Once | INTRAMUSCULAR | Status: AC
  Administered 2019-04-30: 20 mL
  Filled 2019-04-30: qty 1

## 2019-04-30 MED ORDER — POLYVINYL ALCOHOL 1.4 % OP SOLN
1.0000 [drp] | OPHTHALMIC | Status: DC | PRN
  Filled 2019-04-30: qty 15

## 2019-04-30 MED ORDER — ACETAMINOPHEN 325 MG PO TABS
650.0000 mg | ORAL_TABLET | Freq: Four times a day (QID) | ORAL | Status: DC | PRN
  Filled 2019-04-30: qty 2

## 2019-04-30 MED ORDER — VITAMIN D 25 MCG (1000 UNIT) PO TABS
5000.0000 [IU] | ORAL_TABLET | Freq: Every day | ORAL | Status: DC
  Administered 2019-04-30 – 2019-05-01 (×2): 5000 [IU] via ORAL
  Filled 2019-04-30 (×2): qty 5

## 2019-04-30 MED ORDER — ADULT MULTIVITAMIN W/MINERALS CH
1.0000 | ORAL_TABLET | Freq: Every day | ORAL | Status: DC
  Administered 2019-04-30 – 2019-05-01 (×2): 1 via ORAL
  Filled 2019-04-30 (×2): qty 1

## 2019-04-30 MED ORDER — GABAPENTIN 300 MG PO CAPS
300.0000 mg | ORAL_CAPSULE | Freq: Two times a day (BID) | ORAL | Status: DC
  Administered 2019-04-30 – 2019-05-01 (×3): 300 mg via ORAL
  Filled 2019-04-30 (×3): qty 1

## 2019-04-30 MED ORDER — INSULIN ASPART 100 UNIT/ML ~~LOC~~ SOLN: 0.0000 [IU] | Freq: Every day | SUBCUTANEOUS | Status: DC

## 2019-04-30 MED ORDER — VENLAFAXINE HCL 37.5 MG PO TABS
75.0000 mg | ORAL_TABLET | Freq: Two times a day (BID) | ORAL | Status: DC
  Administered 2019-04-30 – 2019-05-01 (×3): 75 mg via ORAL
  Filled 2019-04-30 (×4): qty 2

## 2019-04-30 MED ORDER — ACETAMINOPHEN 325 MG PO TABS: 650.0000 mg | ORAL_TABLET | Freq: Four times a day (QID) | ORAL | Status: DC | PRN

## 2019-04-30 MED ORDER — ONDANSETRON HCL 4 MG/2ML IJ SOLN: 4.0000 mg | Freq: Four times a day (QID) | INTRAMUSCULAR | Status: DC | PRN

## 2019-04-30 MED ORDER — LORATADINE 10 MG PO TABS
10.0000 mg | ORAL_TABLET | Freq: Every day | ORAL | Status: DC
  Administered 2019-04-30 – 2019-05-01 (×2): 10 mg via ORAL
  Filled 2019-04-30 (×2): qty 1

## 2019-04-30 MED ORDER — ATORVASTATIN CALCIUM 20 MG PO TABS
40.0000 mg | ORAL_TABLET | Freq: Every day | ORAL | Status: DC
  Administered 2019-04-30 – 2019-05-01 (×2): 40 mg via ORAL
  Filled 2019-04-30 (×2): qty 2

## 2019-04-30 MED ORDER — INSULIN ASPART 100 UNIT/ML ~~LOC~~ SOLN
0.0000 [IU] | Freq: Three times a day (TID) | SUBCUTANEOUS | Status: DC
  Administered 2019-04-30: 2 [IU] via SUBCUTANEOUS
  Filled 2019-04-30: qty 1

## 2019-04-30 MED ORDER — POLYETHYLENE GLYCOL 3350 17 G PO PACK: 17.0000 g | PACK | Freq: Every day | ORAL | Status: DC | PRN

## 2019-04-30 MED ORDER — BRIMONIDINE TARTRATE 0.2 % OP SOLN
1.0000 [drp] | Freq: Three times a day (TID) | OPHTHALMIC | Status: DC
  Administered 2019-04-30 – 2019-05-01 (×3): 1 [drp] via OPHTHALMIC
  Filled 2019-04-30: qty 5

## 2019-04-30 MED ORDER — SODIUM CHLORIDE 0.9 % IV BOLUS
500.0000 mL | Freq: Once | INTRAVENOUS | Status: AC
  Administered 2019-04-30: 08:00:00 500 mL via INTRAVENOUS

## 2019-04-30 MED ORDER — LOSARTAN POTASSIUM 50 MG PO TABS
50.0000 mg | ORAL_TABLET | Freq: Every day | ORAL | Status: DC
  Administered 2019-04-30 – 2019-05-01 (×2): 50 mg via ORAL
  Filled 2019-04-30 (×2): qty 1

## 2019-04-30 MED ORDER — ACETAMINOPHEN 650 MG RE SUPP: 650.0000 mg | Freq: Four times a day (QID) | RECTAL | Status: DC | PRN

## 2019-04-30 MED ORDER — NETARSUDIL-LATANOPROST 0.02-0.005 % OP SOLN: 1.0000 [drp] | Freq: Every day | OPHTHALMIC | Status: DC

## 2019-04-30 MED ORDER — BRIMONIDINE TARTRATE-TIMOLOL 0.2-0.5 % OP SOLN: 1.0000 [drp] | Freq: Two times a day (BID) | OPHTHALMIC | Status: DC

## 2019-04-30 MED ORDER — FENTANYL CITRATE (PF) 100 MCG/2ML IJ SOLN
25.0000 ug | Freq: Once | INTRAMUSCULAR | Status: AC
  Administered 2019-04-30: 08:00:00 25 ug via INTRAVENOUS
  Filled 2019-04-30: qty 2

## 2019-04-30 MED ORDER — INSULIN PUMP
Freq: Three times a day (TID) | SUBCUTANEOUS | Status: DC
  Administered 2019-04-30: 18:00:00 via SUBCUTANEOUS
  Filled 2019-04-30: qty 1

## 2019-04-30 MED ORDER — FERROUS SULFATE 325 (65 FE) MG PO TABS
325.0000 mg | ORAL_TABLET | Freq: Every day | ORAL | Status: DC
  Administered 2019-05-01: 325 mg via ORAL
  Filled 2019-04-30: qty 1

## 2019-04-30 MED ORDER — OXYCODONE HCL 5 MG PO TABS
5.0000 mg | ORAL_TABLET | ORAL | Status: DC | PRN
  Administered 2019-04-30 – 2019-05-01 (×5): 5 mg via ORAL
  Filled 2019-04-30 (×5): qty 1

## 2019-04-30 MED ORDER — GABAPENTIN 300 MG PO CAPS: 300.0000 mg | ORAL_CAPSULE | Freq: Three times a day (TID) | ORAL | Status: DC

## 2019-04-30 MED ORDER — ONDANSETRON HCL 4 MG/2ML IJ SOLN
4.0000 mg | Freq: Once | INTRAMUSCULAR | Status: AC
  Administered 2019-04-30: 4 mg via INTRAVENOUS
  Filled 2019-04-30: qty 2

## 2019-04-30 MED ORDER — ONDANSETRON HCL 4 MG PO TABS: 4.0000 mg | ORAL_TABLET | Freq: Four times a day (QID) | ORAL | Status: DC | PRN

## 2019-04-30 MED ORDER — TIMOLOL MALEATE 0.5 % OP SOLN
1.0000 [drp] | Freq: Three times a day (TID) | OPHTHALMIC | Status: DC
  Administered 2019-04-30 – 2019-05-01 (×3): 1 [drp] via OPHTHALMIC
  Filled 2019-04-30: qty 5

## 2019-04-30 NOTE — ED Notes (Signed)
Husband to go home and bring back insulin pump for patient to use per diabetes coordinator. Admitting MD aware of insulin pump orders needed.

## 2019-04-30 NOTE — ED Notes (Signed)
Pt typically has dexcom and CBG sensor on person. Pt removed these for CT. Will manage CBG/insulin while in hospital. Pt CBG was 185 per personal sensor before disconnection.

## 2019-04-30 NOTE — ED Triage Notes (Addendum)
Pt to triage via w/c, mask in place; Husband st pt fell backwards hitting head, st not witnessed but "heard it" 76min PTA; pt reports she just fell, denies LOC or dizziness but c/o HA, denies cervical tenderness with palpation and denies any other c/o or injuries; lac noted to rt side of scalp with min bleeding

## 2019-04-30 NOTE — ED Notes (Signed)
Pt walked to bathroom x2 assist but was unable to urinate.

## 2019-04-30 NOTE — ED Notes (Signed)
Patient transported to CT 

## 2019-04-30 NOTE — Progress Notes (Signed)
eeg completed ° °

## 2019-04-30 NOTE — H&P (Signed)
[facility] NAME: Erin Daniels    MR#:  [id number]  DATE OF BIRTH:  Oct 13, 1945  DATE OF ADMISSION:  04/30/2019  PRIMARY CARE PHYSICIAN: Juluis Pitch, MD   REQUESTING/REFERRING PHYSICIAN: Dr. Derrell Lolling  CHIEF COMPLAINT:   Chief Complaint  Patient presents with   Head Injury    HISTORY OF PRESENT ILLNESS:  Erin Daniels  is a 73 y.o. Daniels with a known history of type 1 diabetes mellitus on insulin pump, hypertension, hypothyroidism, diabetic retinopathy presents to hospital after a fall and bleeding from right side of her scalp. Patient received pain medication and was resting at the time of my exam.  Most of the history is obtained from her husband at bedside.  Patient has been having a few falls lately at home.  She ambulates with a walker.  This morning she went to the bathroom because she has loose stools.  Her husband heard a thump and went to the bathroom and saw that she slipped on the rug and hit her head.  She was awake, did not lose her consciousness but was delirious for a few minutes before she was oriented.  Patient complains of headache and some neck pain.  Denies any nausea or vomiting.  No chest pain, no fevers or chills, no respiratory symptoms. Labs were noted to be normal, CT of the head showing acute subarachnoid hemorrhage on the right.  Chronic atrophy quite moderate changes, CT of the C-spine with degenerative changes.  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   Stroke ([place])    x5   Vertigo    Wears glasses     PAST SURGICAL HISTORY:   Past Surgical History:  Procedure Laterality Date   APPENDECTOMY     AUGMENTATION MAMMAPLASTY Bilateral 2011   BILATERAL CARPAL TUNNEL RELEASE     COLONOSCOPY     EYE SURGERY Bilateral    cataract   HEMORROIDECTOMY     HERNIA REPAIR     umbilical   INTRAMEDULLARY (IM) NAIL INTERTROCHANTERIC Right 12/07/2018   Procedure: INTRAMEDULLARY (IM) NAIL INTERTROCHANTRIC;  Surgeon: Thornton Park, MD;  Location: [facility];  Service: Orthopedics;  Laterality: Right;   JOINT REPLACEMENT     right hip   OPEN REDUCTION INTERNAL FIXATION (ORIF) DISTAL RADIAL FRACTURE Right 05/29/2015   Procedure: OPEN REDUCTION INTERNAL FIXATION (ORIF) RIGHT DISTAL RADIUS FRACTURE WITH ALLOGRAFT BONE GRAFTING FOR REPAIR AND RECONSTRUCTION;  Surgeon: Roseanne Kaufman, MD;  Location: [place];  Service: Orthopedics;  Laterality: Right;   OPEN REDUCTION INTERNAL FIXATION (ORIF) METACARPAL Right 01/09/2019   Procedure: OPEN REDUCTION INTERNAL FIXATION (ORIF) RIGHT SECOND METACARPAL FRACTURE;  Surgeon: Corky Mull, MD;  Location: [facility];  Service: Orthopedics;  Laterality: Right;   SHOULDER ARTHROSCOPY Left    SHOULDER HEMI-ARTHROPLASTY Left    SYNOVECTOMY     TONSILLECTOMY     TRIGGER FINGER RELEASE     TUBAL LIGATION      SOCIAL HISTORY:   Social History   Tobacco Use   Smoking status: Never Smoker   Smokeless tobacco: Never Used  Substance Use Topics   Alcohol use: No    FAMILY HISTORY:   Family History  Problem Relation Age of Onset   Hypertension Mother    Stroke Mother    Heart failure Father     DRUG ALLERGIES:   Allergies  Allergen Reactions   Carbocaine [Mepivacaine Hcl] Palpitations    Low bp   Hydrocodone Other  (See Comments)    Diabetic/  High sugars   Tussionex Pennkinetic Er [Hydrocod Polst-Cpm Polst Er] Other (See Comments)    Diabetic/  High sugars   Codeine Nausea Only and Other (See Comments)    Reaction: stress attacks/ headache and nausea Can take Vicodin and Percocet    Meperidine Nausea Only and Other (See Comments)    Reaction: stress attacks/ headache and nausea   Metoclopramide Hcl Nausea Only and Other (See Comments)    Reaction: stress attacks/ headache    Stadol [Butorphanol] Nausea Only and Other (See Comments)    Reaction: stress attacks/ headache    REVIEW OF SYSTEMS:   Review of Systems  Constitutional: Positive for malaise/fatigue. Negative for chills, fever and weight loss.  HENT: Negative for ear discharge, ear pain, hearing loss and nosebleeds.   Eyes: Negative for blurred vision, double vision and photophobia.  Respiratory: Negative for cough, hemoptysis, shortness of breath and wheezing.   Cardiovascular: Negative for chest pain, palpitations, orthopnea and leg swelling.  Gastrointestinal: Negative for abdominal pain, constipation, diarrhea, heartburn, melena, nausea and vomiting.  Genitourinary: Negative for dysuria, frequency, hematuria and urgency.  Musculoskeletal: Negative for back pain, myalgias and neck pain.  Skin: Negative for rash.  Neurological: Positive for headaches. Negative for dizziness, tingling, tremors, sensory change, speech change and focal weakness.  Endo/Heme/Allergies: Does not bruise/bleed easily.  Psychiatric/Behavioral: Negative for depression.    MEDICATIONS AT HOME:   Prior to Admission medications   Medication Sig Start Date End Date Taking? Authorizing Provider  atorvastatin (LIPITOR) 40 MG tablet Take 1 tablet (40 mg total) by mouth daily. Patient taking differently: Take 40 mg by mouth daily at 6 PM.  10/10/18  Yes Medina-Vargas, Monina C, NP  brimonidine-timolol (COMBIGAN) 0.2-0.5 % ophthalmic solution Place 1 drop into  both eyes every 12 (twelve) hours. Instill one drop in right eye morning, noon and bedtime    Yes [provider]  Calcium Carb-Cholecalciferol (CALCIUM 600 + D) 600-200 MG-UNIT TABS Take 1 tablet by mouth every evening.   Yes [provider]  cetirizine (ZYRTEC) 10 MG tablet Take 1 tablet (10 mg total) by mouth daily. 10/10/18  Yes Medina-Vargas, Monina C, NP  Cholecalciferol (VITAMIN D-3) 5000 units TABS Take 5,000 Units by mouth daily.   Yes [provider]  dorzolamide-timolol (COSOPT) 22.3-6.8 MG/ML ophthalmic solution Place 1 drop into both eyes daily. 04/14/19  Yes [provider]  ferrous sulfate 325 (65 FE) MG tablet Take 325 mg by mouth daily with breakfast.   Yes [provider]  gabapentin (NEURONTIN) 300 MG capsule 300 mg in the morning, 300 mg in the afternoon, 900 mg in the  evening Patient taking differently: Take 300-900 mg by mouth 3 (three) times daily. 300 mg in the morning, 300 mg in the afternoon, 900 mg in the evening 10/10/18  Yes Medina-Vargas, Monina C, NP  insulin aspart (NOVOLOG FLEXPEN) 100 UNIT/ML FlexPen Inject 0-15 Units into the skin 3 (three) times daily with meals. Inject 0-15 units subcutaneous per sliding scale before meals and at bedtime 0 - 100 units = 0 units 101 - 120 = 1 unit 121 - 150 = 2 units 151 - 200 = 4 units 201 - 250 = 6 units 251 - 300 = 8 units 301 - 350 = 10 units 351 - 400 = 12 units 401 - 450 = 15 units > 450 call MD 10/10/18  Yes Medina-Vargas, Monina C, NP  losartan (COZAAR) 50 MG tablet Take 1 tablet (50 mg total) by mouth daily for 30 days. 10/10/18 04/30/19 Yes Medina-Vargas, Monina C, NP  Netarsudil-Latanoprost (ROCKLATAN) 0.02-0.005 % SOLN Apply 1 drop to eye at bedtime. Instill one drop in right eye at bedtime   Yes [provider]  olmesartan (BENICAR) 20 MG tablet Take 20 mg by mouth daily. 02/28/19  Yes [provider]  venlafaxine (EFFEXOR) 75 MG tablet Take 1 tablet (75 mg  total) by mouth 2 (two) times daily. 10/10/18  Yes Medina-Vargas, Monina C, NP  vitamin C (ASCORBIC ACID) 500 MG tablet Take 500 mg by mouth daily.    Yes [provider]  acetaminophen (TYLENOL) 325 MG tablet Take 2 tablets (650 mg total) by mouth every 6 (six) hours as needed for headache. For mild pain 09/18/18   Saundra Shelling, MD  aspirin EC 81 MG tablet Take 81 mg by mouth daily.    [provider]  butalbital-acetaminophen-caffeine (FIORICET) 50-325-40 MG tablet Take 1 tablet by mouth 2 (two) times daily as needed for headache or migraine. 02/13/19   Gillis Santa, MD  carboxymethylcellulose (REFRESH PLUS) 0.5 % SOLN Place 1 drop into both eyes as needed (for dry eyes). 10/10/18   Medina-Vargas, Monina C, NP  clopidogrel (PLAVIX) 75 MG tablet Take 75 mg by mouth daily. On hold for surgery    [provider]  GLUCAGON EMERGENCY 1 MG injection Inject 1 mg into the muscle once. 01/24/19   [provider]  glucose 4 GM chewable tablet Chew 1 tablet (4 g total) by mouth as needed for low blood sugar. <65 10/10/18   Medina-Vargas, Monina C, NP  Multiple Vitamins-Minerals (CEROVITE ADVANCED FORMULA PO) Take 1 tablet by mouth daily. 09/18/18   [provider]  NON FORMULARY Diet type:  NCS 09/18/18   [provider]  oxyCODONE (ROXICODONE) 5 MG immediate release tablet Take 1-2 tablets (5-10 mg total) by mouth every 4 (four) hours as needed for moderate pain. 01/09/19   Poggi, Marshall Cork, MD  polyethylene glycol (MIRALAX / Floria Raveling) packet Take 17 g by mouth daily as needed.     [provider]  promethazine (PHENERGAN) 25 MG suppository Place 25 mg rectally every 6 (six) hours as needed for nausea or vomiting.    [provider]  senna (SENOKOT) 8.6 MG TABS tablet Take 2 tablets by mouth daily as needed.  09/19/18   [provider]  warfarin (JANTOVEN) 5 MG tablet Take 5 mg by mouth daily.    [provider]  warfarin  (JANTOVEN) 6 MG tablet Take 6 mg by mouth daily.    [provider]  warfarin (JANTOVEN) 7.5 MG tablet Take 7.5 mg  by mouth daily.    [provider]      VITAL SIGNS:  Blood pressure (!) 164/44, pulse 63, temperature (!) 97.3 F (36.3 C), temperature source Oral, resp. rate 13, height 5\' 1"  (1.549 m), weight 45.4 kg, SpO2 94 %.  PHYSICAL EXAMINATION:  Physical Exam  GENERAL:  73 y.o.-year-old patient lying in the bed with no acute distress.  EYES: Pupils  round, reactive to light and accommodation.  Mildly increased dilation of the right pupil, no scleral icterus. Extraocular muscles intact.  HEENT: Head  normocephalic. Right temporal bleed with 2 staples in. Oropharynx and nasopharynx clear.  NECK:  Supple, no jugular venous distention. No thyroid enlargement, no tenderness.  LUNGS: Normal breath sounds bilaterally, no wheezing, rales,rhonchi or crepitation. No use of accessory muscles of respiration.  CARDIOVASCULAR: S1, S2 normal. No  rubs, or gallops. 2/6 systolic murmur present ABDOMEN: Soft, nontender, nondistended. Bowel sounds present. No organomegaly or mass.  EXTREMITIES: No pedal edema, cyanosis, or clubbing.  NEUROLOGIC: Cranial nerves II through XII are intact. Muscle strength 5/5 in all extremities. Sensation intact. Gait not checked.  PSYCHIATRIC: The patient is alert and oriented x 3.  SKIN: No obvious rash, lesion, or ulcer.   LABORATORY PANEL:   CBC Recent Labs  Lab 04/30/19 0701  WBC 12.6*  HGB 10.9*  HCT 33.4*  PLT 268   ------------------------------------------------------------------------------------------------------------------  Chemistries  Recent Labs  Lab 04/30/19 0701  NA 141  K 3.9  CL 102  CO2 27  GLUCOSE 178*  BUN 30*  CREATININE 0.70  CALCIUM 9.8  MG 2.5*  AST 22  ALT 16  ALKPHOS 80  BILITOT 0.4    ------------------------------------------------------------------------------------------------------------------  Cardiac Enzymes No results for input(s): TROPONINI in the last 168 hours. ------------------------------------------------------------------------------------------------------------------  RADIOLOGY:  Ct Angio Head W Or Wo Contrast  Result Date: 04/30/2019 CLINICAL DATA:  73 year old Daniels with recent fall and headaches. Right sylvian fissure subarachnoid hemorrhage on head CT at 0708 hours today. EXAM: CT ANGIOGRAPHY HEAD TECHNIQUE: Multidetector CT imaging of the head was performed using the standard protocol during bolus administration of intravenous contrast. Multiplanar CT image reconstructions and MIPs were obtained to evaluate the vascular anatomy. CONTRAST:  33mL OMNIPAQUE IOHEXOL 350 MG/ML SOLN COMPARISON:  Head and cervical spine CT earlier today. CTA head and neck 05/28/2018, and earlier. FINDINGS: Posterior circulation: Dominant left vertebral artery, the right terminates in PICA as before. Mild left V4 calcified plaque without stenosis. Normal left PICA origin. Patent and mildly tortuous basilar artery without stenosis. Normal SCA and PCA origins. Small left posterior communicating artery is present, the right is diminutive or absent. Bilateral PCA branches are stable and within normal limits. Anterior circulation: Stable visible distal cervical ICAs, the left appears mildly dominant. Both ICA siphons remain patent. Chronic heavy calcification of both siphons. On the left mild to moderate supraclinoid segment stenosis appears increased since 2019 on series 11, image 91. On the right there is severe supraclinoid stenosis due to calcified plaque on series 7, image 56 which appears chronic but may be mildly increased since 2019 (previous series 6, image 254). Both carotid termini remain patent. The left is dominant along with the left ACA A1 segment. The right A1 is diminutive  or absent. Normal MCA and left ACA origins. Anterior communicating artery is within normal limits, although the left A2 remains dominant simulating azygos ACA anatomy. Left MCA M1 segment and trifurcation are patent without stenosis. Left MCA branches are stable. There is minimal left M2 irregularity on  series 12, image 25. Right M1 and right MCA bifurcation are patent without irregularity or stenosis. Right MCA branches are stable aside from minimal irregularity of the dominant posterior M2 division which might reflect mild vasospasm in this clinical setting (series 12, image 9). Negative for right MCA aneurysm. Venous sinuses: Early contrast timing, the superior sagittal sinus is patent. Anatomic variants: Dominant left and diminutive or absent right ACA A1 segments. Dominant left A2. Dominant left ICA. Dominant left vertebral artery, the right terminates in PICA. Review of the MIP images confirms the above findings IMPRESSION: 1. Negative for intracranial aneurysm or large vessel occlusion. 2. 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 3. Possible minimal vasospasm of the right MCA. 4. Otherwise stable intracranial CTA since 2019, with no other intracranial stenosis. Electronically Signed   By: Genevie Ann M.D.   On: 04/30/2019 10:00   Ct Head Wo Contrast  Result Date: 04/30/2019 CLINICAL DATA:  Recent fall with headaches and neck pain, initial encounter EXAM: CT HEAD WITHOUT CONTRAST CT CERVICAL SPINE WITHOUT CONTRAST TECHNIQUE: Multidetector CT imaging of the head and cervical spine was performed following the standard protocol without intravenous contrast. Multiplanar CT image reconstructions of the cervical spine were also generated. COMPARISON:  12/06/2018 FINDINGS: CT HEAD FINDINGS Brain: Mild subarachnoid hemorrhage is noted on the right involving the sylvian fissure and also in the posterior  temporoparietal region on the right. Generalized atrophic changes are noted. Chronic white matter ischemic change is seen. Vascular: No hyperdense vessel or unexpected calcification. Skull: Normal. Negative for fracture or focal lesion. Sinuses/Orbits: No acute finding. Other: None. CT CERVICAL SPINE FINDINGS Alignment: Loss of normal cervical lordosis is noted. Anterolisthesis of C3 on C4 is noted of a degenerative nature. Skull base and vertebrae: 7 cervical segments are well visualized. Vertebral body height is well maintained. Multilevel facet hypertrophic changes are seen. Disc space narrowing is noted throughout the cervical spine with associated osteophytic changes. No acute fracture or acute facet abnormality is noted. Soft tissues and spinal canal: Surrounding soft tissues are stable in appearance. Upper chest: Visualized lung apices are within normal limits Other: None IMPRESSION: CT of the head: Acute subarachnoid hemorrhage involving the sylvian fissure and temporoparietal region on the right. Chronic atrophic and white matter ischemic changes are noted as well. CT of the cervical spine: Multilevel degenerative change relatively stable from the prior study. Critical Value/emergent results were called by telephone at the time of interpretation on 04/30/2019 at 7:54 am to Dr. Nickolas Madrid , who verbally acknowledged these results. Electronically Signed   By: Inez Catalina M.D.   On: 04/30/2019 07:56   Ct Cervical Spine Wo Contrast  Result Date: 04/30/2019 CLINICAL DATA:  Recent fall with headaches and neck pain, initial encounter EXAM: CT HEAD WITHOUT CONTRAST CT CERVICAL SPINE WITHOUT CONTRAST TECHNIQUE: Multidetector CT imaging of the head and cervical spine was performed following the standard protocol without intravenous contrast. Multiplanar CT image reconstructions of the cervical spine were also generated. COMPARISON:  12/06/2018 FINDINGS: CT HEAD FINDINGS Brain: Mild subarachnoid hemorrhage is noted on the  right involving the sylvian fissure and also in the posterior temporoparietal region on the right. Generalized atrophic changes are noted. Chronic white matter ischemic change is seen. Vascular: No hyperdense vessel or unexpected calcification. Skull: Normal. Negative for fracture or focal lesion. Sinuses/Orbits: No acute finding. Other: None. CT CERVICAL SPINE FINDINGS Alignment: Loss of normal cervical lordosis is  noted. Anterolisthesis of C3 on C4 is noted of a degenerative nature. Skull base and vertebrae: 7 cervical segments are well visualized. Vertebral body height is well maintained. Multilevel facet hypertrophic changes are seen. Disc space narrowing is noted throughout the cervical spine with associated osteophytic changes. No acute fracture or acute facet abnormality is noted. Soft tissues and spinal canal: Surrounding soft tissues are stable in appearance. Upper chest: Visualized lung apices are within normal limits Other: None IMPRESSION: CT of the head: Acute subarachnoid hemorrhage involving the sylvian fissure and temporoparietal region on the right. Chronic atrophic and white matter ischemic changes are noted as well. CT of the cervical spine: Multilevel degenerative change relatively stable from the prior study. Critical Value/emergent results were called by telephone at the time of interpretation on 04/30/2019 at 7:54 am to Dr. Nickolas Madrid , who verbally acknowledged these results. Electronically Signed   By: Inez Catalina M.D.   On: 04/30/2019 07:56      IMPRESSION AND PLAN:   Zalaya Guster  is a 73 y.o. Daniels with a known history of type 1 diabetes mellitus on insulin pump, hypertension, hypothyroidism, diabetic retinopathy presents to hospital after a fall and bleeding from right side of her scalp.  1.  Fall and subarachnoid hemorrhage-mechanical fall at home. -Acute subarachnoid hemorrhage extending into the sylvian fissure and right temporoparietal lobe. -No neuro deficits at this time.   Neurosurgery was contacted from the emergency room and they have recommended observation at this time.  No midline shift noted on CT. -Neurology consulted. -Repeat CT 6 hours later and again tomorrow. -Continue neuro checks on the floor. -Hold her aspirin and Plavix.  Not on any other anticoagulation.  2.  Hypertension-on losartan  3.  Type 1 diabetes mellitus-restart insulin pump, sliding scale insulin.  Appreciate diabetes coordinator input.  4.  Macular degeneration-patient is legally blind in her left eye, has minimal vision in her right eye.  Continue eyedrops  5.  Peripheral neuropathy-on gabapentin  6.  DVT prophylaxis-teds and SCDs only   All the records are reviewed and case discussed with ED provider. Management plans discussed with the patient, family and they are in agreement.  CODE STATUS: Full Code  TOTAL TIME TAKING CARE OF THIS PATIENT: 52 minutes.    Gladstone Lighter M.D on 04/30/2019 at 1:30 PM  Between 7am to 6pm - Pager - [phone]  After 6pm go to [email or web address] - Proofreader  [facility]  Office  [phone]  CC: Primary care physician; Juluis Pitch, MD   Note: This dictation was prepared with Dragon dictation along with smaller phrase technology. Any transcriptional errors that result from this process are unintentional.

## 2019-04-30 NOTE — Progress Notes (Signed)
   [facility] Day: 0 days Erin Daniels is a 73 y.o. female presenting with Head Injury .   Advance care planning discussed with patient and her husband who is her HCPOA at bedside.  -All questions in regards to overall condition and expected prognosis answered.   -Patient has mild cognitive impairment at baseline, independent other than the falls and uses a walker. -She has known history of type 1 diabetes mellitus, hypertension, macular degeneration and diabetic complications. -Current admission diagnosis are acute subarachnoid hemorrhage after a fall, diabetes. -After explaining the current prognosis and overall condition, the decision was made to continue current code status  CODE STATUS: Full code Time spent: 18 minutes

## 2019-04-30 NOTE — Consult Note (Signed)
Reason for Consult:SAH Referring Physician: Tressia Miners  CC: Fall  HPI: Erin Daniels is an 73 y.o. female with a history of multiple medical problems including stroke on ASA and Plavix who presents after a fall.  Patient is unable to provide much history about the fall.  She does not report any premonitory symptoms or that she hit/tripped. Does report falling backward and hitting her head.    Past Medical History:  Diagnosis Date  . At high risk for falls   . Basal cell carcinoma    right upper leg  . Complication of anesthesia    difficult to arouse  . Diabetes mellitus without complication (HCC)    Type I  . Diabetic neuropathy ([place])   . Diabetic retinopathy ([place])   . Dizziness   . Gastroparesis   . Herniated disc, cervical   . Hypercholesteremia   . Hypertension   . Hypotensive episode   . Hypothyroidism   . Insulin pump in place   . Myofascial muscle pain   . Numbness of arm    left  . Osteoporosis   . Poor vision   . Seizures ([place])   . Slurred speech   . 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 Stroke (HCC)    x5  . Vertigo   . Wears glasses     Past Surgical History:  Procedure Laterality Date  . APPENDECTOMY    . AUGMENTATION MAMMAPLASTY Bilateral 2011  . BILATERAL CARPAL TUNNEL RELEASE    . COLONOSCOPY    . EYE SURGERY Bilateral    cataract  . HEMORROIDECTOMY    . HERNIA REPAIR     umbilical  . INTRAMEDULLARY (IM) NAIL INTERTROCHANTERIC Right 12/07/2018   Procedure: INTRAMEDULLARY (IM) NAIL INTERTROCHANTRIC;  Surgeon: Thornton Park, MD;  Location: [facility];  Service: Orthopedics;  Laterality: Right;  . JOINT REPLACEMENT     right hip  . OPEN REDUCTION INTERNAL FIXATION (ORIF) DISTAL RADIAL FRACTURE Right 05/29/2015   Procedure: OPEN REDUCTION INTERNAL FIXATION (ORIF) RIGHT DISTAL RADIUS FRACTURE WITH ALLOGRAFT BONE GRAFTING FOR REPAIR AND RECONSTRUCTION;  Surgeon: Roseanne Kaufman, MD;  Location: [place];  Service: Orthopedics;  Laterality: Right;  . OPEN  REDUCTION INTERNAL FIXATION (ORIF) METACARPAL Right 01/09/2019   Procedure: OPEN REDUCTION INTERNAL FIXATION (ORIF) RIGHT SECOND METACARPAL FRACTURE;  Surgeon: Corky Mull, MD;  Location: [facility];  Service: Orthopedics;  Laterality: Right;  . SHOULDER ARTHROSCOPY Left   . SHOULDER HEMI-ARTHROPLASTY Left   . SYNOVECTOMY    . TONSILLECTOMY    . TRIGGER FINGER RELEASE    . TUBAL LIGATION      Family History  Problem Relation Age of Onset  . Hypertension Mother   . Stroke Mother   . Heart failure Father     Social History:  reports that she has never smoked. She has never used smokeless tobacco. She reports that she does not drink alcohol or use drugs.  Allergies  Allergen Reactions  . Carbocaine [Mepivacaine Hcl] Palpitations    Low bp  . Hydrocodone Other (See Comments)    Diabetic/  High sugars  . Tussionex Pennkinetic Er [Hydrocod Polst-Cpm Polst Er] Other (See Comments)    Diabetic/  High sugars  . Codeine Nausea Only and Other (See Comments)    Reaction: stress attacks/ headache and nausea Can take Vicodin and Percocet   . Meperidine Nausea Only and Other (See Comments)    Reaction: stress attacks/ headache and nausea  . Metoclopramide Hcl Nausea Only and  Other (See Comments)    Reaction: stress attacks/ headache   . Stadol [Butorphanol] Nausea Only and Other (See Comments)    Reaction: stress attacks/ headache    Medications:  I have reviewed the patient's current medications. Prior to Admission:  Medications Prior to Admission  Medication Sig Dispense Refill Last Dose  . atorvastatin (LIPITOR) 40 MG tablet Take 1 tablet (40 mg total) by mouth daily. (Patient taking differently: Take 40 mg by mouth daily at 6 PM. ) 30 tablet 0 04/29/2019 at 1800  . brimonidine-timolol (COMBIGAN) 0.2-0.5 % ophthalmic solution Place 1 drop into both eyes every 12 (twelve) hours. Instill one drop in right eye morning, noon and bedtime    04/29/2019 at 2000  . Calcium Carb-Cholecalciferol  (CALCIUM 600 + D) 600-200 MG-UNIT TABS Take 1 tablet by mouth every evening.   04/29/2019 at 2000  . cetirizine (ZYRTEC) 10 MG tablet Take 1 tablet (10 mg total) by mouth daily. 30 tablet 0 04/29/2019 at 0800  . Cholecalciferol (VITAMIN D-3) 5000 units TABS Take 5,000 Units by mouth daily.   04/29/2019 at 0800  . dorzolamide-timolol (COSOPT) 22.3-6.8 MG/ML ophthalmic solution Place 1 drop into both eyes daily.   04/29/2019 at 0800  . ferrous sulfate 325 (65 FE) MG tablet Take 325 mg by mouth daily with breakfast.   04/29/2019 at 0800  . gabapentin (NEURONTIN) 300 MG capsule 300 mg in the morning, 300 mg in the afternoon, 900 mg in the evening (Patient taking differently: Take 300-900 mg by mouth 3 (three) times daily. 300 mg in the morning, 300 mg in the afternoon, 900 mg in the evening) 150 capsule 0 04/29/2019 at 2000  . insulin aspart (NOVOLOG FLEXPEN) 100 UNIT/ML FlexPen Inject 0-15 Units into the skin 3 (three) times daily with meals. Inject 0-15 units subcutaneous per sliding scale before meals and at bedtime 0 - 100 units = 0 units 101 - 120 = 1 unit 121 - 150 = 2 units 151 - 200 = 4 units 201 - 250 = 6 units 251 - 300 = 8 units 301 - 350 = 10 units 351 - 400 = 12 units 401 - 450 = 15 units > 450 call MD 15 mL 0 04/29/2019 at Unknown time  . losartan (COZAAR) 50 MG tablet Take 1 tablet (50 mg total) by mouth daily for 30 days. 30 tablet 0 04/29/2019 at 0800  . Netarsudil-Latanoprost (ROCKLATAN) 0.02-0.005 % SOLN Apply 1 drop to eye at bedtime. Instill one drop in right eye at bedtime   04/29/2019 at 2000  . olmesartan (BENICAR) 20 MG tablet Take 20 mg by mouth daily.     Marland Kitchen venlafaxine (EFFEXOR) 75 MG tablet Take 1 tablet (75 mg total) by mouth 2 (two) times daily. 60 tablet 0 04/29/2019 at 2000  . vitamin C (ASCORBIC ACID) 500 MG tablet Take 500 mg by mouth daily.    04/29/2019 at 0800  . acetaminophen (TYLENOL) 325 MG tablet Take 2 tablets (650 mg total) by mouth every 6 (six) hours as needed for headache.  For mild pain 30 tablet 0 prn at prn  . aspirin EC 81 MG tablet Take 81 mg by mouth daily.   Not Taking at Unknown time  . butalbital-acetaminophen-caffeine (FIORICET) 50-325-40 MG tablet Take 1 tablet by mouth 2 (two) times daily as needed for headache or migraine. 30 tablet 4 prn at prn  . carboxymethylcellulose (REFRESH PLUS) 0.5 % SOLN Place 1 drop into both eyes as needed (for dry eyes). 15  mL 0 prn at prn  . clopidogrel (PLAVIX) 75 MG tablet Take 75 mg by mouth daily. On hold for surgery     . GLUCAGON EMERGENCY 1 MG injection Inject 1 mg into the muscle once.   prn at prn  . glucose 4 GM chewable tablet Chew 1 tablet (4 g total) by mouth as needed for low blood sugar. <65 50 tablet 0 prn at prn  . Multiple Vitamins-Minerals (CEROVITE ADVANCED FORMULA PO) Take 1 tablet by mouth daily.     . NON FORMULARY Diet type:  NCS     . oxyCODONE (ROXICODONE) 5 MG immediate release tablet Take 1-2 tablets (5-10 mg total) by mouth every 4 (four) hours as needed for moderate pain. 30 tablet 0   . polyethylene glycol (MIRALAX / GLYCOLAX) packet Take 17 g by mouth daily as needed.    prn at prn  . promethazine (PHENERGAN) 25 MG suppository Place 25 mg rectally every 6 (six) hours as needed for nausea or vomiting.   prn at prn  . senna (SENOKOT) 8.6 MG TABS tablet Take 2 tablets by mouth daily as needed.    prn at prn  . warfarin (JANTOVEN) 5 MG tablet Take 5 mg by mouth daily.     Marland Kitchen warfarin (JANTOVEN) 6 MG tablet Take 6 mg by mouth daily.     Marland Kitchen warfarin (JANTOVEN) 7.5 MG tablet Take 7.5 mg by mouth daily.      Scheduled: . atorvastatin  40 mg Oral q1800  . brimonidine  1 drop Right Eye TID   And  . timolol  1 drop Right Eye TID  . cholecalciferol  5,000 Units Oral Daily  . [START ON 05/01/2019] ferrous sulfate  325 mg Oral Q breakfast  . gabapentin  300 mg Oral BID   And  . gabapentin  900 mg Oral QHS  . insulin aspart  0-5 Units Subcutaneous QHS  . insulin aspart  0-9 Units Subcutaneous TID WC   . insulin pump   Subcutaneous TID AC, HS, 0200  . loratadine  10 mg Oral Daily  . losartan  50 mg Oral Daily  . multivitamin with minerals  1 tablet Oral Daily  . Netarsudil-Latanoprost  1 drop Ophthalmic QHS  . venlafaxine  75 mg Oral BID    ROS: History obtained from the patient  General ROS: negative for - chills, fatigue, fever, night sweats, weight gain or weight loss Psychological ROS: negative for - behavioral disorder, hallucinations, memory difficulties, mood swings or suicidal ideation Ophthalmic ROS: negative for - blurry vision, double vision, eye pain or loss of vision ENT ROS: negative for - epistaxis, nasal discharge, oral lesions, sore throat, tinnitus or vertigo Allergy and Immunology ROS: negative for - hives or itchy/watery eyes Hematological and Lymphatic ROS: negative for - bleeding problems, bruising or swollen lymph nodes Endocrine ROS: negative for - galactorrhea, hair pattern changes, polydipsia/polyuria or temperature intolerance Respiratory ROS: negative for - cough, hemoptysis, shortness of breath or wheezing Cardiovascular ROS: negative for - chest pain, dyspnea on exertion, edema or irregular heartbeat Gastrointestinal ROS: negative for - abdominal pain, diarrhea, hematemesis, nausea/vomiting or stool incontinence Genito-Urinary ROS: negative for - dysuria, hematuria, incontinence or urinary frequency/urgency Musculoskeletal ROS: negative for - joint swelling or muscular weakness Neurological ROS: as noted in HPI, headache Dermatological ROS: negative for rash and skin lesion changes  Physical Examination: Blood pressure (!) 164/44, pulse 63, temperature (!) 97.3 F (36.3 C), temperature source Oral, resp. rate 13, height 5\' 1"  (  1.549 m), weight 45.4 kg, SpO2 94 %.  HEENT-  Normocephalic, no lesions, without obvious abnormality.  Normal external eye and conjunctiva.  Normal TM's bilaterally.  Normal auditory canals and external ears. Normal external nose,  mucus membranes and septum.  Normal pharynx. Cardiovascular- S1, S2 normal, pulses palpable throughout   Lungs- chest clear, no wheezing, rales, normal symmetric air entry Abdomen- soft, non-tender; bowel sounds normal; no masses,  no organomegaly Extremities- no edema Lymph-no adenopathy palpable Musculoskeletal-no joint tenderness, deformity or swelling Skin-warm and dry, no hyperpigmentation, vitiligo, or suspicious lesions  Neurological Examination   Mental Status: Lethargic but oriented, thought content appropriate.  Speech fluent without evidence of aphasia but dysarhtric.  Able to follow 3 step commands without difficulty. Cranial Nerves: II: Visual fields grossly normal, pupils irregular and poorly reactive bilaterally III,IV, VI: ptosis not present, extra-ocular motions intact bilaterally V,VII: right facial droop, facial light touch sensation normal bilaterally VIII: hearing normal bilaterally IX,X: gag reflex present XI: bilateral shoulder shrug XII: midline tongue extension Motor: Right : Upper extremity   5/5    Left:     Upper extremity   5/5  Lower extremity   5/5     Lower extremity   5/5 Tone and bulk:normal tone throughout; no atrophy noted Sensory: Pinprick and light touch intact throughout, bilaterally Deep Tendon Reflexes: Symmetric throughout Plantars: Right: mute   Left: mute Cerebellar: Normal finger-to-nose and normal heel-to-shin testing bilaterally Gait: not tested due to safety concerns    Laboratory Studies:   Basic Metabolic Panel: Recent Labs  Lab 04/30/19 0701  NA 141  K 3.9  CL 102  CO2 27  GLUCOSE 178*  BUN 30*  CREATININE 0.70  CALCIUM 9.8  MG 2.5*    Liver Function Tests: Recent Labs  Lab 04/30/19 0701  AST 22  ALT 16  ALKPHOS 80  BILITOT 0.4  PROT 7.4  ALBUMIN 4.3   No results for input(s): LIPASE, AMYLASE in the last 168 hours. No results for input(s): AMMONIA in the last 168 hours.  CBC: Recent Labs  Lab  04/30/19 0701  WBC 12.6*  NEUTROABS 9.7*  HGB 10.9*  HCT 33.4*  MCV 93.6  PLT 268    Cardiac Enzymes: No results for input(s): CKTOTAL, CKMB, CKMBINDEX, TROPONINI in the last 168 hours.  BNP: Invalid input(s): POCBNP  CBG: Recent Labs  Lab 04/30/19 1142 04/30/19 1241  GLUCAP 182* 170*    Microbiology: Results for orders placed or performed during the hospital encounter of 04/30/19  SARS Coronavirus 2 [facility] order, Performed in [facility] hospital lab) Nasopharyngeal     Status: None   Collection Time: 04/30/19  9:42 AM   Specimen: Nasopharyngeal  Result Value Ref Range Status   SARS Coronavirus 2 NEGATIVE NEGATIVE Final    Comment: (NOTE) If result is NEGATIVE SARS-CoV-2 target nucleic acids are NOT DETECTED. The SARS-CoV-2 RNA is generally detectable in upper and lower  respiratory specimens during the acute phase of infection. The lowest  concentration of SARS-CoV-2 viral copies this assay can detect is 250  copies / mL. A negative result does not preclude SARS-CoV-2 infection  and should not be used as the sole basis for treatment or other  patient management decisions.  A negative result may occur with  improper specimen collection / handling, submission of specimen other  than nasopharyngeal swab, presence of viral mutation(s) within the  areas targeted by this assay, and inadequate number of viral copies  (<250 copies / mL). A negative  result must be combined with clinical  observations, patient history, and epidemiological information. If result is POSITIVE SARS-CoV-2 target nucleic acids are DETECTED. The SARS-CoV-2 RNA is generally detectable in upper and lower  respiratory specimens dur ing the acute phase of infection.  Positive  results are indicative of active infection with SARS-CoV-2.  Clinical  correlation with patient history and other diagnostic information is  necessary to determine patient infection status.  Positive results do  not rule  out bacterial infection or co-infection with other viruses. If result is PRESUMPTIVE POSTIVE SARS-CoV-2 nucleic acids MAY BE PRESENT.   A presumptive positive result was obtained on the submitted specimen  and confirmed on repeat testing.  While 2019 novel coronavirus  (SARS-CoV-2) nucleic acids may be present in the submitted sample  additional confirmatory testing may be necessary for epidemiological  and / or clinical management purposes  to differentiate between  SARS-CoV-2 and other Sarbecovirus currently known to infect humans.  If clinically indicated additional testing with an alternate test  methodology [phone]) is advised. The SARS-CoV-2 RNA is generally  detectable in upper and lower respiratory sp ecimens during the acute  phase of infection. The expected result is Negative. Fact Sheet for Patients:  StrictlyIdeas.no Fact Sheet for Healthcare Providers: BankingDealers.co.za This test is not yet approved or cleared by the [place] FDA and has been authorized for detection and/or diagnosis of SARS-CoV-2 by FDA under an Emergency Use Authorization (EUA).  This EUA will remain in effect (meaning this test can be used) for the duration of the COVID-19 declaration under Section 564(b)(1) of the Act, 21 U.S.C. section 360bbb-3(b)(1), unless the authorization is terminated or revoked sooner. Performed at [facility], [address] [place], [place] [address]     Coagulation Studies: Recent Labs    04/30/19 0701  LABPROT 12.9  INR 1.0    Urinalysis:  Recent Labs  Lab 04/30/19 0901  COLORURINE YELLOW*  LABSPEC 1.018  PHURINE 5.0  GLUCOSEU NEGATIVE  HGBUR NEGATIVE  BILIRUBINUR NEGATIVE  KETONESUR NEGATIVE  PROTEINUR NEGATIVE  NITRITE NEGATIVE  LEUKOCYTESUR SMALL*    Lipid Panel:     Component Value Date/Time   CHOL 148 05/29/2018 0556   TRIG 88 05/29/2018 0556   HDL 84 05/29/2018 0556    CHOLHDL 1.8 05/29/2018 0556   VLDL 18 05/29/2018 0556   LDLCALC 46 05/29/2018 0556    HgbA1C:  Lab Results  Component Value Date   HGBA1C 6.9 (H) 05/29/2018    Urine Drug Screen:      Component Value Date/Time   LABOPIA NONE DETECTED 12/06/2018 2242   COCAINSCRNUR NONE DETECTED 12/06/2018 2242   LABBENZ POSITIVE (A) 12/06/2018 2242   AMPHETMU NONE DETECTED 12/06/2018 2242   THCU NONE DETECTED 12/06/2018 2242   LABBARB POSITIVE (A) 12/06/2018 2242    Alcohol Level: No results for input(s): ETH in the last 168 hours.  Other results: EKG: sinus rhythm at 65 bpm.  Imaging: Ct Angio Head W Or Wo Contrast  Result Date: 04/30/2019 CLINICAL DATA:  73 year old female with recent fall and headaches. Right sylvian fissure subarachnoid hemorrhage on head CT at 0708 hours today. EXAM: CT ANGIOGRAPHY HEAD TECHNIQUE: Multidetector CT imaging of the head was performed using the standard protocol during bolus administration of intravenous contrast. Multiplanar CT image reconstructions and MIPs were obtained to evaluate the vascular anatomy. CONTRAST:  68mL OMNIPAQUE IOHEXOL 350 MG/ML SOLN COMPARISON:  Head and cervical spine CT earlier today. CTA head and neck 05/28/2018, and earlier. FINDINGS:  Posterior circulation: Dominant left vertebral artery, the right terminates in PICA as before. Mild left V4 calcified plaque without stenosis. Normal left PICA origin. Patent and mildly tortuous basilar artery without stenosis. Normal SCA and PCA origins. Small left posterior communicating artery is present, the right is diminutive or absent. Bilateral PCA branches are stable and within normal limits. Anterior circulation: Stable visible distal cervical ICAs, the left appears mildly dominant. Both ICA siphons remain patent. Chronic heavy calcification of both siphons. On the left mild to moderate supraclinoid segment stenosis appears increased since 2019 on series 11, image 91. On the right there is severe  supraclinoid stenosis due to calcified plaque on series 7, image 56 which appears chronic but may be mildly increased since 2019 (previous series 6, image 254). Both carotid termini remain patent. The left is dominant along with the left ACA A1 segment. The right A1 is diminutive or absent. Normal MCA and left ACA origins. Anterior communicating artery is within normal limits, although the left A2 remains dominant simulating azygos ACA anatomy. Left MCA M1 segment and trifurcation are patent without stenosis. Left MCA branches are stable. There is minimal left M2 irregularity on series 12, image 25. Right M1 and right MCA bifurcation are patent without irregularity or stenosis. Right MCA branches are stable aside from minimal irregularity of the dominant posterior M2 division which might reflect mild vasospasm in this clinical setting (series 12, image 9). Negative for right MCA aneurysm. Venous sinuses: Early contrast timing, the superior sagittal sinus is patent. Anatomic variants: Dominant left and diminutive or absent right ACA A1 segments. Dominant left A2. Dominant left ICA. Dominant left vertebral artery, the right terminates in PICA. Review of the MIP images confirms the above findings IMPRESSION: 1. Negative for intracranial aneurysm or large vessel occlusion. 2. 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 3. Possible minimal vasospasm of the right MCA. 4. Otherwise stable intracranial CTA since 2019, with no other intracranial stenosis. Electronically Signed   By: Genevie Ann M.D.   On: 04/30/2019 10:00   Ct Head Wo Contrast  Result Date: 04/30/2019 CLINICAL DATA:  Recent fall with headaches and neck pain, initial encounter EXAM: CT HEAD WITHOUT CONTRAST CT CERVICAL SPINE WITHOUT CONTRAST TECHNIQUE: Multidetector CT imaging of the head and cervical spine was performed following the standard protocol  without intravenous contrast. Multiplanar CT image reconstructions of the cervical spine were also generated. COMPARISON:  12/06/2018 FINDINGS: CT HEAD FINDINGS Brain: Mild subarachnoid hemorrhage is noted on the right involving the sylvian fissure and also in the posterior temporoparietal region on the right. Generalized atrophic changes are noted. Chronic white matter ischemic change is seen. Vascular: No hyperdense vessel or unexpected calcification. Skull: Normal. Negative for fracture or focal lesion. Sinuses/Orbits: No acute finding. Other: None. CT CERVICAL SPINE FINDINGS Alignment: Loss of normal cervical lordosis is noted. Anterolisthesis of C3 on C4 is noted of a degenerative nature. Skull base and vertebrae: 7 cervical segments are well visualized. Vertebral body height is well maintained. Multilevel facet hypertrophic changes are seen. Disc space narrowing is noted throughout the cervical spine with associated osteophytic changes. No acute fracture or acute facet abnormality is noted. Soft tissues and spinal canal: Surrounding soft tissues are stable in appearance. Upper chest: Visualized lung apices are within normal limits Other: None IMPRESSION: CT of the head: Acute subarachnoid hemorrhage involving the sylvian fissure and temporoparietal region on the right. Chronic atrophic  and white matter ischemic changes are noted as well. CT of the cervical spine: Multilevel degenerative change relatively stable from the prior study. Critical Value/emergent results were called by telephone at the time of interpretation on 04/30/2019 at 7:54 am to Dr. Nickolas Madrid , who verbally acknowledged these results. Electronically Signed   By: Inez Catalina M.D.   On: 04/30/2019 07:56   Ct Cervical Spine Wo Contrast  Result Date: 04/30/2019 CLINICAL DATA:  Recent fall with headaches and neck pain, initial encounter EXAM: CT HEAD WITHOUT CONTRAST CT CERVICAL SPINE WITHOUT CONTRAST TECHNIQUE: Multidetector CT imaging of the head  and cervical spine was performed following the standard protocol without intravenous contrast. Multiplanar CT image reconstructions of the cervical spine were also generated. COMPARISON:  12/06/2018 FINDINGS: CT HEAD FINDINGS Brain: Mild subarachnoid hemorrhage is noted on the right involving the sylvian fissure and also in the posterior temporoparietal region on the right. Generalized atrophic changes are noted. Chronic white matter ischemic change is seen. Vascular: No hyperdense vessel or unexpected calcification. Skull: Normal. Negative for fracture or focal lesion. Sinuses/Orbits: No acute finding. Other: None. CT CERVICAL SPINE FINDINGS Alignment: Loss of normal cervical lordosis is noted. Anterolisthesis of C3 on C4 is noted of a degenerative nature. Skull base and vertebrae: 7 cervical segments are well visualized. Vertebral body height is well maintained. Multilevel facet hypertrophic changes are seen. Disc space narrowing is noted throughout the cervical spine with associated osteophytic changes. No acute fracture or acute facet abnormality is noted. Soft tissues and spinal canal: Surrounding soft tissues are stable in appearance. Upper chest: Visualized lung apices are within normal limits Other: None IMPRESSION: CT of the head: Acute subarachnoid hemorrhage involving the sylvian fissure and temporoparietal region on the right. Chronic atrophic and white matter ischemic changes are noted as well. CT of the cervical spine: Multilevel degenerative change relatively stable from the prior study. Critical Value/emergent results were called by telephone at the time of interpretation on 04/30/2019 at 7:54 am to Dr. Nickolas Madrid , who verbally acknowledged these results. Electronically Signed   By: Inez Catalina M.D.   On: 04/30/2019 07:56     Assessment/Plan: 73 year old female with a history of multiple medical problems including stroke on ASA and Plavix who presents after a fall.  Circumstances surrounding fall are  unclear.  Head CT reviewed and shows a right sylvian fissure/posterior temporal parietal SAH.  Likely traumatic in etiology.  CTA shows no evidence of aneurysm although severe right ICA and moderate left ICA stenosis noted.    Recommendations: 1. With no etiology for fall and history of stroke would perform EEG 2. MRI of the brain without contrast 3. Orthostatic vitals 4. Vascular to follow carotid stenosis.  May be performed as an outpatient unless MRI indicative of an acute infarct.    Alexis Goodell, MD Neurology [phone] 04/30/2019, 1:21 PM

## 2019-04-30 NOTE — ED Provider Notes (Signed)
[facility] Emergency Department Provider Note  ____________________________________________   None    (approximate)  I have reviewed the triage vital signs and the nursing notes.  History  Chief Complaint Head Injury    HPI Erin Daniels is a 73 y.o. female with history of type 1 diabetes, multiple CVAs, hypertension, hyperlipidemia who presents emergency department for a fall.  The patient states she fell backwards hitting her head.  She is unsure as to why she fell, but does state that she felt lightheaded before hand. She denies any loss of consciousness.  No chest pain, palpitations, vision changes, or spinning sensation.  She does have a small laceration to the back of her head as well as an associated moderate headache.  She denies any neck pain, chest pain, abdominal pain, hip pain, or extremity pain.  She is on aspirin and Plavix.  No other blood thinners.         Past Medical Hx Past Medical History:  Diagnosis Date  . At high risk for falls   . Basal cell carcinoma    right upper leg  . Complication of anesthesia    difficult to arouse  . Diabetes mellitus without complication (HCC)    Type I  . Diabetic neuropathy ([place])   . Diabetic retinopathy ([place])   . Dizziness   . Gastroparesis   . Herniated disc, cervical   . Hypercholesteremia   . Hypertension   . Hypotensive episode   . Hypothyroidism   . Insulin pump in place   . Myofascial muscle pain   . Numbness of arm    left  . Osteoporosis   . Poor vision   . Seizures ([place])   . Slurred speech   . Stroke (cerebrum) ([place])    4/19  . Stroke (HCC)    x5  . Vertigo   . Wears glasses     Problem List Patient Active Problem List   Diagnosis Date Noted  . Intertrochanteric fracture of right hip ([place]) 12/08/2018  . Hypercoagulopathy ([place]) 09/29/2018  . Acute anxiety 09/29/2018  . Hypertension associated with type 1 diabetes mellitus ([place]) 09/19/2018  . Dyslipidemia due to  type 1 diabetes mellitus ([place]) 09/19/2018  . Anemia of chronic disease 09/19/2018  . Chronic migraine without aura 09/19/2018  . Chronic non-seasonal allergic rhinitis 09/19/2018  . Major depression, recurrent, chronic ([place]) 09/19/2018  . Diabetic gastroparesis associated with type 1 diabetes mellitus ([place]) 09/19/2018  . Bilateral sacral insufficiency fracture 09/19/2018  . Constipation 09/15/2018  . Acute urinary retention 09/15/2018  . Intractable pain 09/15/2018  . Chronic cerebrovascular accident (CVA) 05/28/2018  . Diabetic polyneuropathy associated with type 1 diabetes mellitus ([place]) 12/10/2017  . Numbness and tingling of both legs 12/10/2017  . Chronic pain syndrome 12/10/2017  . DKA (diabetic ketoacidoses) ([place]) 11/12/2017  . Confusion 11/11/2017  . Hypoglycemia 11/26/2016  . Lactic acid acidosis   . Sepsis ([place])   . 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 Essential hypertension   . Depression   . Right radial fracture 05/29/2015    Past Surgical Hx Past Surgical History:  Procedure Laterality Date  . APPENDECTOMY    . AUGMENTATION MAMMAPLASTY Bilateral 2011  . BILATERAL CARPAL TUNNEL RELEASE    . COLONOSCOPY    . EYE SURGERY Bilateral    cataract  . HEMORROIDECTOMY    . HERNIA REPAIR     umbilical  . INTRAMEDULLARY (IM) NAIL INTERTROCHANTERIC  Right 12/07/2018   Procedure: INTRAMEDULLARY (IM) NAIL INTERTROCHANTRIC;  Surgeon: Thornton Park, MD;  Location: [facility] ORS;  Service: Orthopedics;  Laterality: Right;  . JOINT REPLACEMENT     right hip  . OPEN REDUCTION INTERNAL FIXATION (ORIF) DISTAL RADIAL FRACTURE Right 05/29/2015   Procedure: OPEN REDUCTION INTERNAL FIXATION (ORIF) RIGHT DISTAL RADIUS FRACTURE WITH ALLOGRAFT BONE GRAFTING FOR REPAIR AND RECONSTRUCTION;  Surgeon: Roseanne Kaufman, MD;  Location: [facility];  Service: Orthopedics;  Laterality: Right;  . OPEN REDUCTION INTERNAL FIXATION (ORIF) METACARPAL Right 01/09/2019    Procedure: OPEN REDUCTION INTERNAL FIXATION (ORIF) RIGHT SECOND METACARPAL FRACTURE;  Surgeon: Corky Mull, MD;  Location: [facility] ORS;  Service: Orthopedics;  Laterality: Right;  . SHOULDER ARTHROSCOPY Left   . SHOULDER HEMI-ARTHROPLASTY Left   . SYNOVECTOMY    . TONSILLECTOMY    . TRIGGER FINGER RELEASE    . TUBAL LIGATION      Medications Prior to Admission medications   Medication Sig Start Date End Date Taking? Authorizing Provider  acetaminophen (TYLENOL) 325 MG tablet Take 2 tablets (650 mg total) by mouth every 6 (six) hours as needed for headache. For mild pain 09/18/18   Saundra Shelling, MD  aspirin EC 81 MG tablet Take 81 mg by mouth daily.    [provider]  atorvastatin (LIPITOR) 40 MG tablet Take 1 tablet (40 mg total) by mouth daily. Patient taking differently: Take 40 mg by mouth daily at 6 PM.  10/10/18   Medina-Vargas, Monina C, NP  brimonidine-timolol (COMBIGAN) 0.2-0.5 % ophthalmic solution Place 1 drop into both eyes every 12 (twelve) hours. Instill one drop in right eye morning, noon and bedtime     [provider]  butalbital-acetaminophen-caffeine (FIORICET) 50-325-40 MG tablet Take 1 tablet by mouth 2 (two) times daily as needed for headache or migraine. 02/13/19   Gillis Santa, MD  Calcium Carb-Cholecalciferol (CALCIUM 600 + D) 600-200 MG-UNIT TABS Take 1 tablet by mouth every evening.    [provider]  carboxymethylcellulose (REFRESH PLUS) 0.5 % SOLN Place 1 drop into both eyes as needed (for dry eyes). 10/10/18   Medina-Vargas, Monina C, NP  cetirizine (ZYRTEC) 10 MG tablet Take 1 tablet (10 mg total) by mouth daily. 10/10/18   Medina-Vargas, Monina C, NP  Cholecalciferol (VITAMIN D-3) 5000 units TABS Take 5,000 Units by mouth daily.    [provider]  clopidogrel (PLAVIX) 75 MG tablet Take 75 mg by mouth daily. On hold for surgery    [provider]  ferrous sulfate 325 (65 FE) MG tablet Take 325 mg by mouth daily with  breakfast.    [provider]  gabapentin (NEURONTIN) 300 MG capsule 300 mg in the morning, 300 mg in the afternoon, 900 mg in the evening Patient taking differently: Take 300-900 mg by mouth 3 (three) times daily. 300 mg in the morning, 300 mg in the afternoon, 900 mg in the evening 10/10/18   Medina-Vargas, Monina C, NP  glucose 4 GM chewable tablet Chew 1 tablet (4 g total) by mouth as needed for low blood sugar. <65 10/10/18   Medina-Vargas, Monina C, NP  insulin aspart (NOVOLOG FLEXPEN) 100 UNIT/ML FlexPen Inject 0-15 Units into the skin 3 (three) times daily with meals. Inject 0-15 units subcutaneous per sliding scale before meals and at bedtime 0 - 100 units = 0 units 101 - 120 = 1 unit 121 - 150 = 2 units 151 - 200 = 4 units 201 - 250 = 6 units 251 -  300 = 8 units 301 - 350 = 10 units 351 - 400 = 12 units 401 - 450 = 15 units > 450 call MD 10/10/18   Medina-Vargas, Monina C, NP  losartan (COZAAR) 50 MG tablet Take 1 tablet (50 mg total) by mouth daily for 30 days. 10/10/18 01/08/19  Medina-Vargas, Monina C, NP  Multiple Vitamins-Minerals (CEROVITE ADVANCED FORMULA PO) Take 1 tablet by mouth daily. 09/18/18   [provider]  Netarsudil-Latanoprost (ROCKLATAN) 0.02-0.005 % SOLN Apply 1 drop to eye at bedtime. Instill one drop in right eye at bedtime    [provider]  NON FORMULARY Diet type:  NCS 09/18/18   [provider]  oxyCODONE (ROXICODONE) 5 MG immediate release tablet Take 1-2 tablets (5-10 mg total) by mouth every 4 (four) hours as needed for moderate pain. 01/09/19   Poggi, Marshall Cork, MD  polyethylene glycol (MIRALAX / Floria Raveling) packet Take 17 g by mouth daily as needed.     [provider]  promethazine (PHENERGAN) 25 MG suppository Place 25 mg rectally every 6 (six) hours as needed for nausea or vomiting.    [provider]  senna (SENOKOT) 8.6 MG TABS tablet Take 2 tablets by mouth daily as needed.  09/19/18   [provider]  venlafaxine (EFFEXOR) 75 MG tablet Take 1 tablet (75 mg total) by mouth 2 (two) times daily. 10/10/18   Medina-Vargas, Monina C, NP  vitamin C (ASCORBIC ACID) 500 MG tablet Take 500 mg by mouth daily.     [provider]    Allergies Carbocaine [mepivacaine hcl], Tussionex pennkinetic er [hydrocod polst-cpm polst er], Codeine, Meperidine, Metoclopramide hcl, and Stadol [butorphanol]  Family Hx Family History  Problem Relation Age of Onset  . Hypertension Mother   . Stroke Mother   . Heart failure Father     Social Hx Social History   Tobacco Use  . Smoking status: Never Smoker  . Smokeless tobacco: Never Used  Substance Use Topics  . Alcohol use: No  . Drug use: No     Review of Systems  Constitutional: Negative for fever. Negative for chills. Eyes: Negative for visual changes. ENT: Negative for sore throat. Cardiovascular: Negative for chest pain. Respiratory: Negative for shortness of breath. Gastrointestinal: Negative for abdominal pain. Negative for nausea. Negative for vomiting. Genitourinary: Negative for dysuria. Musculoskeletal: Negative for leg swelling. Skin: Negative for rash. + laceration to scalp. Neurological: + for for headaches.   Physical Exam  Vital Signs: ED Triage Vitals  Enc Vitals Group     BP 04/30/19 0650 (!) 110/52     Pulse Rate 04/30/19 0653 83     Resp 04/30/19 0650 18     Temp 04/30/19 0653 97.6 F (36.4 C)     Temp Source 04/30/19 0653 Oral     SpO2 04/30/19 0653 98 %     Weight 04/30/19 0647 100 lb (45.4 kg)     Height 04/30/19 0647 5\' 1"  (1.549 m)     Head Circumference --      Peak Flow --      Pain Score 04/30/19 0647 8     Pain Loc --      Pain Edu? --      Excl. in [place]? --     Constitutional: Alert and oriented.  Eyes: Conjunctivae clear. Sclera anicteric. Head: Normocephalic. Atraumatic. Nose: No congestion. No rhinorrhea. Mouth/Throat: Mucous membranes are moist. No intraoral or dental trauma. Neck:  No stridor.  Full range of  motion.  No midline C-spine tenderness. Cardiovascular: Normal rate, regular rhythm. No murmurs. Extremities well perfused. Respiratory: Normal respiratory effort.  Lungs CTAB.  Chest wall is stable and nontender.  No crepitance. Gastrointestinal: Soft and non-tender. No distention.  Pelvis: Stable and nontender with AP and lateral compression. Musculoskeletal: No lower extremity edema.  No deformities.  Full range of motion to upper and lower extremities.  No tenderness. Neurologic:  Normal speech and language. No gross focal neurologic deficits are appreciated.  Skin: 2 cm laceration to the right posterior scalp, hemostatic. Psychiatric: Mood and affect are appropriate for situation.  EKG  Personally reviewed.   Rate: 65 Rhythm: sinus Axis: normal Intervals: WNL No acute ischemic changes No STEMI    Radiology  CT head: - Acute subarachnoid hemorrhage involving the sylvianfissure and temporoparietal region on the right. - Chronic atrophic and white matter ischemic changes are noted as well.  CT CS: - CT of the cervical spine: Multilevel degenerative change relatively stable from the prior study.  CTA: IMPRESSION: 1. Negative for intracranial aneurysm or large vessel occlusion. 2. 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 3. Possible minimal vasospasm of the right MCA. 4. Otherwise stable intracranial CTA since 2019, with no other intracranial stenosis.     Procedures  Procedure(s) performed (including critical care):  Marland KitchenMarland KitchenLaceration Repair  Date/Time: 04/30/2019 7:52 AM Performed by: Lilia Pro., MD Authorized by: Lilia Pro., MD   Consent:    Consent obtained:  Verbal   Consent given by:  Patient   Risks discussed:  Infection, pain and poor wound healing Anesthesia (see MAR for exact dosages):    Anesthesia method:   Local infiltration   Local anesthetic:  Lidocaine 1% WITH epi Laceration details:    Location:  Scalp   Scalp location:  Occipital   Length (cm):  2 Repair type:    Repair type:  Simple Exploration:    Hemostasis achieved with:  Direct pressure and epinephrine   Wound exploration: wound explored through full range of motion     Wound extent: no foreign bodies/material noted     Contaminated: no   Treatment:    Area cleansed with:  Soap and water (chloraprep)   Amount of cleaning:  Standard   Irrigation solution:  Sterile saline Skin repair:    Repair method:  Staples   Number of staples:  2 Post-procedure details:    Dressing:  Open (no dressing)   Patient tolerance of procedure:  Tolerated well, no immediate complications  .Critical Care Performed by: Lilia Pro., MD Authorized by: Lilia Pro., MD   Critical care provider statement:    Critical care time (minutes):  45   Critical care was necessary to treat or prevent imminent or life-threatening deterioration of the following conditions:  CNS failure or compromise   Critical care was time spent personally by me on the following activities:  Discussions with consultants, evaluation of patient's response to treatment, examination of patient, ordering and performing treatments and interventions, ordering and review of laboratory studies, ordering and review of radiographic studies, pulse oximetry, re-evaluation of patient's condition, obtaining history from patient or surrogate and review of old charts     Initial Impression / Assessment and Plan / ED Course  73 y.o. female who presents to the ED for a fall with resultant scalp laceration. Reports episode of lightheadedness preceding her fall.   Plan: will  obtain CT head/CS imaging. Basic labs, urine, EKG to evaluate for underlying precipitating etiology for her fall.  Laceration repair.  CTH reveals SAH.  Will discuss with Neurosurgery.  9:00 AM Discussed with Dr.  Cari Caraway of Neurosurgery.  SAH is likely traumatic in etiology, but will obtain a CTA to evaluate for underlying aneurysm.  If aneurysm is present, patient will require transfer, however if there is no aneurysm, subarachnoid is likely to be traumatic in etiology, and therefore patient may be admitted here with neurosurgery to follow.  10:00 AM CTA is negative for aneurysm.  As such, will plan for admission here.  Updated neurosurgery on results.  Discussed with hospitalist for admission.  Final Clinical Impression(s) / ED Diagnosis  Final diagnoses:  Fall, initial encounter  Laceration of scalp, initial encounter  SAH (subarachnoid hemorrhage) ([place])       Note:  This document was prepared using Dragon voice recognition software and may include unintentional dictation errors.   Lilia Pro., MD 04/30/19 [phone]

## 2019-04-30 NOTE — Progress Notes (Signed)
Inpatient Diabetes Program Recommendations  AACE/ADA: New Consensus Statement on Inpatient Glycemic Control (2015)  Target Ranges:  Prepandial:   less than 140 mg/dL      Peak postprandial:   less than 180 mg/dL (1-2 hours)      Critically ill patients:  140 - 180 mg/dL   Lab Results  Component Value Date   GLUCAP 131 (H) 01/09/2019   HGBA1C 6.9 (H) 05/29/2018    Review of Glycemic Control  Diabetes history: : DM1 (makes NO insulin; requires basal, correction, and carbohydrate coverage insulin; dx in 1968) Outpatient Diabetes medications: Insulin pump Current orders for Inpatient glycemic control: Novolog sensitive correction tid + hs 0-5  Inpatient Diabetes Program Recommendations:   Spoke with RN Judson Roch Janett Billow) [place] regarding patient currently has insulin pump off due to she went for a scan. Reminded RN patient is a type 1 patient and RN to request patient's husband to bring back supplies for insulin pump. Otherwise patient will need to be started on a basal insulin. Patient had Dexcom CGM teaching with sensor placed on 03/20/19 @ Dr. Joycie Peek office (endocrinologist). Next endocrinology appt is for 05/23/19 with Dr. Gabriel Carina.  Thank you, Nani Gasser. Shanin Szymanowski, RN, MSN, CDE  Diabetes Coordinator Inpatient Glycemic Control Team Team Pager [phone] (8am-5pm) 04/30/2019 11:44 AM

## 2019-04-30 NOTE — Consult Note (Signed)
Referring Physician:  No referring provider defined for this encounter.  Primary Physician:  Juluis Pitch, MD  Chief Complaint: Fall  History of Present Illness: Erin Daniels is a 73 y.o. female on anticoagulation therapy (Plavix) who presents as a consult for subarachnoid hemorrhage sustained after a fall today.  Patient states she fell earlier today and hit the back of her head.  Denies LOC.  Patient found by husband and was brought to ED for evaluation.  Complains of headache right occipital head, where she sustained laceration.  Denies any vision, speech, cognitive changes since the fall.    Review of Systems:  A 10 point review of systems is negative, except for the pertinent positives and negatives detailed in the HPI.  Past Medical History: Past Medical History:  Diagnosis Date  . At high risk for falls   . Basal cell carcinoma    right upper leg  . Complication of anesthesia    difficult to arouse  . Diabetes mellitus without complication (HCC)    Type I  . Diabetic neuropathy ([place])   . Diabetic retinopathy ([place])   . Dizziness   . Gastroparesis   . Herniated disc, cervical   . Hypercholesteremia   . Hypertension   . Hypotensive episode   . Hypothyroidism   . Insulin pump in place   . Myofascial muscle pain   . Numbness of arm    left  . Osteoporosis   . Poor vision   . Seizures ([place])   . Slurred speech   . 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 Stroke (HCC)    x5  . Vertigo   . Wears glasses     Past Surgical History: Past Surgical History:  Procedure Laterality Date  . APPENDECTOMY    . AUGMENTATION MAMMAPLASTY Bilateral 2011  . BILATERAL CARPAL TUNNEL RELEASE    . COLONOSCOPY    . EYE SURGERY Bilateral    cataract  . HEMORROIDECTOMY    . HERNIA REPAIR     umbilical  . INTRAMEDULLARY (IM) NAIL INTERTROCHANTERIC Right 12/07/2018   Procedure: INTRAMEDULLARY (IM) NAIL INTERTROCHANTRIC;  Surgeon: Thornton Park, MD;  Location: [facility];   Service: Orthopedics;  Laterality: Right;  . JOINT REPLACEMENT     right hip  . OPEN REDUCTION INTERNAL FIXATION (ORIF) DISTAL RADIAL FRACTURE Right 05/29/2015   Procedure: OPEN REDUCTION INTERNAL FIXATION (ORIF) RIGHT DISTAL RADIUS FRACTURE WITH ALLOGRAFT BONE GRAFTING FOR REPAIR AND RECONSTRUCTION;  Surgeon: Roseanne Kaufman, MD;  Location: [facility];  Service: Orthopedics;  Laterality: Right;  . OPEN REDUCTION INTERNAL FIXATION (ORIF) METACARPAL Right 01/09/2019   Procedure: OPEN REDUCTION INTERNAL FIXATION (ORIF) RIGHT SECOND METACARPAL FRACTURE;  Surgeon: Corky Mull, MD;  Location: [facility];  Service: Orthopedics;  Laterality: Right;  . SHOULDER ARTHROSCOPY Left   . SHOULDER HEMI-ARTHROPLASTY Left   . SYNOVECTOMY    . TONSILLECTOMY    . TRIGGER FINGER RELEASE    . TUBAL LIGATION      Allergies: Allergies as of 04/30/2019 - Review Complete 04/30/2019  Allergen Reaction Noted  . Carbocaine [mepivacaine hcl] Palpitations 05/25/2015  . Hydrocodone Other (See Comments) 05/25/2015  . Tussionex pennkinetic er [hydrocod polst-cpm polst er] Other (See Comments) 05/25/2015  . Codeine Nausea Only and Other (See Comments) 05/25/2015  . Meperidine Nausea Only and Other (See Comments) 05/25/2015  . Metoclopramide hcl Nausea Only and Other (See Comments) 05/25/2015  . Stadol [butorphanol] Nausea Only and Other (See Comments) 05/25/2015    Medications:  Current Facility-Administered Medications:  .  acetaminophen (TYLENOL) tablet 650 mg, 650 mg, Oral, Q6H PRN **OR** acetaminophen (TYLENOL) suppository 650 mg, 650 mg, Rectal, Q6H PRN, Gladstone Lighter, MD .  acetaminophen (TYLENOL) tablet 650 mg, 650 mg, Oral, Q6H PRN, Gladstone Lighter, MD .  atorvastatin (LIPITOR) tablet 40 mg, 40 mg, Oral, q1800, Gladstone Lighter, MD .  brimonidine (ALPHAGAN) 0.2 % ophthalmic solution 1 drop, 1 drop, Right Eye, TID **AND** timolol (TIMOPTIC) 0.5 % ophthalmic solution 1 drop, 1 drop, Right Eye, TID, Gladstone Lighter, MD .  carboxymethylcellulose (REFRESH PLUS) 0.5 % ophthalmic solution 1 drop, 1 drop, Both Eyes, PRN, Gladstone Lighter, MD .  cholecalciferol (VITAMIN D3) tablet 5,000 Units, 5,000 Units, Oral, Daily, Gladstone Lighter, MD .  Derrill Memo ON 05/01/2019] ferrous sulfate tablet 325 mg, 325 mg, Oral, Q breakfast, Tressia Miners, Radhika, MD .  gabapentin (NEURONTIN) capsule 300 mg, 300 mg, Oral, BID **AND** gabapentin (NEURONTIN) capsule 900 mg, 900 mg, Oral, QHS, Kalisetti, Radhika, MD .  insulin aspart (novoLOG) injection 0-5 Units, 0-5 Units, Subcutaneous, QHS, Kalisetti, Radhika, MD .  insulin aspart (novoLOG) injection 0-9 Units, 0-9 Units, Subcutaneous, TID WC, Kalisetti, Radhika, MD .  insulin pump, , Subcutaneous, TID AC, HS, 0200, Kalisetti, Radhika, MD .  loratadine (CLARITIN) tablet 10 mg, 10 mg, Oral, Daily, Tressia Miners, Radhika, MD .  losartan (COZAAR) tablet 50 mg, 50 mg, Oral, Daily, Tressia Miners, Radhika, MD .  multivitamin with minerals tablet 1 tablet, 1 tablet, Oral, Daily, Tressia Miners, Radhika, MD .  Netarsudil-Latanoprost 0.02-0.005 % SOLN 1 drop, 1 drop, Ophthalmic, QHS, Kalisetti, Radhika, MD .  ondansetron (ZOFRAN) tablet 4 mg, 4 mg, Oral, Q6H PRN **OR** ondansetron (ZOFRAN) injection 4 mg, 4 mg, Intravenous, Q6H PRN, Tressia Miners, Radhika, MD .  oxyCODONE (Oxy IR/ROXICODONE) immediate release tablet 5-10 mg, 5-10 mg, Oral, Q4H PRN, Tressia Miners, Radhika, MD .  polyethylene glycol (MIRALAX / GLYCOLAX) packet 17 g, 17 g, Oral, Daily PRN, Gladstone Lighter, MD .  venlafaxine [facility]) tablet 75 mg, 75 mg, Oral, BID, Gladstone Lighter, MD   Social History: Social History   Tobacco Use  . Smoking status: Never Smoker  . Smokeless tobacco: Never Used  Substance Use Topics  . Alcohol use: No  . Drug use: No    Family Medical History: Family History  Problem Relation Age of Onset  . Hypertension Mother   . Stroke Mother   . Heart failure Father     Physical  Examination: Vitals:   04/30/19 1212 04/30/19 1215  BP: (!) 164/44   Pulse: 63   Resp:    Temp:  (!) 97.3 F (36.3 C)  SpO2: 94%      General: Patient is well developed, well nourished, calm, collected, and in no apparent distress.  Psychiatric: Patient is non-anxious.  Head:  Staple laceration right posterior skull  ENT:  Oral mucosa appears well hydrated.  Neck:   Supple.  Full range of motion.  Respiratory: Patient is breathing without any difficulty.  Extremities: No edema.  Skin:   On exposed skin, there are no abnormal skin lesions.  NEUROLOGICAL:  General: In no acute distress.   Awake, alert, oriented to person, place, and time.  Facial tone and sensation is symmetric.  Tongue protrusion is midline.  There is no pronator drift.  Able to correctly identify 5 objects.  Speech is fluent and clear.   Strength: Side Biceps Deltoid Grip  R 5 5 5   L 5 5 5    Side Iliopsoas Quads Hamstring PF DF  R  5 5 5 5 5   L 5 5 5 5 5    Reflexes are 2+ and symmetric at the biceps, triceps, brachioradialis, patella and achilles.   Bilateral upper and lower extremity sensation is intact to light touch.   Hoffman's is absent.  Imaging: EXAM: CT HEAD WITHOUT CONTRAST  CT CERVICAL SPINE WITHOUT CONTRAST  TECHNIQUE: Multidetector CT imaging of the head and cervical spine was performed following the standard protocol without intravenous contrast. Multiplanar CT image reconstructions of the cervical spine were also generated.  COMPARISON:  12/06/2018  FINDINGS: CT HEAD FINDINGS  Brain: Mild subarachnoid hemorrhage is noted on the right involving the sylvian fissure and also in the posterior temporoparietal region on the right. Generalized atrophic changes are noted. Chronic white matter ischemic change is seen.  Vascular: No hyperdense vessel or unexpected calcification.  Skull: Normal. Negative for fracture or focal lesion.  Sinuses/Orbits: No acute  finding.  Other: None.  CT CERVICAL SPINE FINDINGS  Alignment: Loss of normal cervical lordosis is noted. Anterolisthesis of C3 on C4 is noted of a degenerative nature.  Skull base and vertebrae: 7 cervical segments are well visualized. Vertebral body height is well maintained. Multilevel facet hypertrophic changes are seen. Disc space narrowing is noted throughout the cervical spine with associated osteophytic changes. No acute fracture or acute facet abnormality is noted.  Soft tissues and spinal canal: Surrounding soft tissues are stable in appearance.  Upper chest: Visualized lung apices are within normal limits  Other: None  IMPRESSION: CT of the head: Acute subarachnoid hemorrhage involving the sylvian fissure and temporoparietal region on the right.  Chronic atrophic and white matter ischemic changes are noted as well.  CT of the cervical spine: Multilevel degenerative change relatively stable from the prior study.   Assessment and Plan: Erin Daniels is a pleasant 73 y.o. female with subarachnoid hemorrhage after a fall today.  No neurological deficits noted on physical exam.  Recommend repeat CT head 6 hours from initial imaging to determine stability of hemorrhage.  Please contact neurosurgery if any new neurological deficits are noted on physical exam.  Marin Olp, PA-C Dept. of Neurosurgery

## 2019-05-01 ENCOUNTER — Inpatient Hospital Stay: Payer: Medicare Other

## 2019-05-01 DIAGNOSIS — I609 Nontraumatic subarachnoid hemorrhage, unspecified: Secondary | ICD-10-CM | POA: Diagnosis not present

## 2019-05-01 LAB — GLUCOSE, CAPILLARY
Glucose-Capillary: 320 mg/dL — ABNORMAL HIGH (ref 70–99)
Glucose-Capillary: 331 mg/dL — ABNORMAL HIGH (ref 70–99)
Glucose-Capillary: 141 mg/dL — ABNORMAL HIGH (ref 70–99)
Glucose-Capillary: 320 mg/dL — ABNORMAL HIGH (ref 70–99)
Glucose-Capillary: 303 mg/dL — ABNORMAL HIGH (ref 70–99)
Glucose-Capillary: 245 mg/dL — ABNORMAL HIGH (ref 70–99)
Glucose-Capillary: 109 mg/dL — ABNORMAL HIGH (ref 70–99)

## 2019-05-01 LAB — BASIC METABOLIC PANEL
Anion gap: 8 (ref 5–15)
BUN: 27 mg/dL — ABNORMAL HIGH (ref 8–23)
CO2: 26 mmol/L (ref 22–32)
Calcium: 8.8 mg/dL — ABNORMAL LOW (ref 8.9–10.3)
Chloride: 104 mmol/L (ref 98–111)
Creatinine, Ser: 0.63 mg/dL (ref 0.44–1.00)
GFR calc Af Amer: >60 mL/min (ref >60)
GFR calc non Af Amer: >60 mL/min (ref >60)
Glucose, Bld: 150 mg/dL — ABNORMAL HIGH (ref 70–99)
Potassium: 4 mmol/L (ref 3.5–5.1)
Sodium: 138 mmol/L (ref 135–145)

## 2019-05-01 LAB — URINE CULTURE: Culture: NO GROWTH

## 2019-05-01 LAB — CBC
HCT: 28.5 % — ABNORMAL LOW (ref 36.0–46.0)
Hemoglobin: 9.4 g/dL — ABNORMAL LOW (ref 12.0–15.0)
MCH: 30.8 pg (ref 26.0–34.0)
MCHC: 33 g/dL (ref 30.0–36.0)
MCV: 93.4 fL (ref 80.0–100.0)
Platelets: 234 10*3/uL (ref 150–400)
RBC: 3.05 MIL/uL — ABNORMAL LOW (ref 3.87–5.11)
RDW: 13 % (ref 11.5–15.5)
WBC: 6.4 10*3/uL (ref 4.0–10.5)
nRBC: 0 % (ref 0.0–0.2)

## 2019-05-01 MED ORDER — INSULIN ASPART 100 UNIT/ML ~~LOC~~ SOLN: 0.0000 [IU] | Freq: Every day | SUBCUTANEOUS | Status: DC

## 2019-05-01 MED ORDER — INSULIN GLARGINE 100 UNIT/ML ~~LOC~~ SOLN
10.0000 [IU] | Freq: Every day | SUBCUTANEOUS | Status: DC
  Administered 2019-05-01: 10 [IU] via SUBCUTANEOUS
  Filled 2019-05-01 (×3): qty 0.1

## 2019-05-01 MED ORDER — INSULIN ASPART 100 UNIT/ML ~~LOC~~ SOLN
8.0000 [IU] | Freq: Once | SUBCUTANEOUS | Status: AC
  Administered 2019-05-01: 8 [IU] via SUBCUTANEOUS
  Filled 2019-05-01: qty 1

## 2019-05-01 MED ORDER — INSULIN ASPART 100 UNIT/ML ~~LOC~~ SOLN
3.0000 [IU] | Freq: Three times a day (TID) | SUBCUTANEOUS | Status: DC
  Administered 2019-05-01 (×2): 3 [IU] via SUBCUTANEOUS
  Filled 2019-05-01 (×2): qty 1

## 2019-05-01 MED ORDER — INSULIN ASPART 100 UNIT/ML ~~LOC~~ SOLN
7.0000 [IU] | Freq: Once | SUBCUTANEOUS | Status: AC
  Administered 2019-05-01: 7 [IU] via SUBCUTANEOUS

## 2019-05-01 MED ORDER — INSULIN ASPART 100 UNIT/ML ~~LOC~~ SOLN
0.0000 [IU] | Freq: Three times a day (TID) | SUBCUTANEOUS | Status: DC
  Administered 2019-05-01: 7 [IU] via SUBCUTANEOUS
  Filled 2019-05-01 (×2): qty 1

## 2019-05-01 NOTE — Progress Notes (Signed)
Discharge instructions given and went over with patient at bedside. All questions answered. Patient discharged home. Oak Dorey S, RN  

## 2019-05-01 NOTE — TOC Initial Note (Signed)
Transition of Care [facility]) - Initial/Assessment Note    Patient Details  Name: Erin Daniels MRN: [id number] Date of Birth: [birth year]  [facility] CM/SW Contact:    Shelbie Hutching, RN Phone Number: 05/01/2019, 2:52 PM  Clinical Narrative:                 Patient is from home with husband admitted after a fall in her bathroom with subarachnoid hemorrhage.  Patient at baseline is very independent and can walk with a walker.  Patient has had home health in the past with [place] but does not want home health set up after this hospitalization.   Husband is at the bedside and they are waiting on the MD to let them know the results from today's head CT and weather or not the patient can go home today.  No other discharge needs identified.   Expected Discharge Plan: Home/Self Care Barriers to Discharge: Continued Medical Work up   Patient Goals and CMS Choice Patient states their goals for this hospitalization and ongoing recovery are:: Want to know the results from the CT and then be able to go home      Expected Discharge Plan and Services Expected Discharge Plan: Home/Self Care   Discharge Planning Services: CM Consult   Living arrangements for the past 2 months: Single Family Home                                      Prior Living Arrangements/Services Living arrangements for the past 2 months: Single Family Home Lives with:: Spouse Patient language and need for interpreter reviewed:: No Do you feel safe going back to the place where you live?: Yes      Need for Family Participation in Patient Care: Yes (Comment)(fall with subarachnoid) Care giver support system in place?: Yes (comment)(husband) Current home services: DME(Walker, 3 in 1, safety rails, PERS) Criminal Activity/Legal Involvement Pertinent to Current Situation/Hospitalization: No - Comment as needed  Activities of Daily Living Home Assistive Devices/Equipment: CBG Meter,  Walker (specify type), Raised toilet seat with rails ADL Screening (condition at time of admission) Patient's cognitive ability adequate to safely complete daily activities?: Yes Is the patient deaf or have difficulty hearing?: No Does the patient have difficulty seeing, even when wearing glasses/contacts?: No Does the patient have difficulty concentrating, remembering, or making decisions?: No Patient able to express need for assistance with ADLs?: Yes Does the patient have difficulty dressing or bathing?: No Independently performs ADLs?: Yes (appropriate for developmental age) Does the patient have difficulty walking or climbing stairs?: Yes Weakness of Legs: Right Weakness of Arms/Hands: None  Permission Sought/Granted Permission sought to share information with : Case Manager, Family Supports Permission granted to share information with : Yes, Verbal Permission Granted        Permission granted to share info w Relationship: husband Bill     Emotional Assessment Appearance:: Appears stated age Attitude/Demeanor/Rapport: Engaged Affect (typically observed): Accepting Orientation: : Oriented to Self, Oriented to Place, Oriented to  Time, Oriented to Situation Alcohol / Substance Use: Not Applicable Psych Involvement: No (comment)  Admission diagnosis:  SAH (subarachnoid hemorrhage) ([place]) [I60.9] Fall, initial encounter [W19.XXXA] Laceration of scalp, initial encounter [S01.01XA] Patient Active Problem List   Diagnosis Date Noted  . Subarachnoid hematoma ([place]) 04/30/2019  . Intertrochanteric fracture of right hip ([place]) 12/08/2018  . Hypercoagulopathy ([place]) 09/29/2018  . Acute  anxiety 09/29/2018  . Hypertension associated with type 1 diabetes mellitus ([place]) 09/19/2018  . Dyslipidemia due to type 1 diabetes mellitus ([place]) 09/19/2018  . Anemia of chronic disease 09/19/2018  . Chronic migraine without aura 09/19/2018  . Chronic non-seasonal allergic rhinitis 09/19/2018  . Major  depression, recurrent, chronic ([place]) 09/19/2018  . Diabetic gastroparesis associated with type 1 diabetes mellitus ([place]) 09/19/2018  . Bilateral sacral insufficiency fracture 09/19/2018  . Constipation 09/15/2018  . Acute urinary retention 09/15/2018  . Intractable pain 09/15/2018  . Chronic cerebrovascular accident (CVA) 05/28/2018  . Diabetic polyneuropathy associated with type 1 diabetes mellitus ([place]) 12/10/2017  . Numbness and tingling of both legs 12/10/2017  . Chronic pain syndrome 12/10/2017  . DKA (diabetic ketoacidoses) ([place]) 11/12/2017  . Confusion 11/11/2017  . Hypoglycemia 11/26/2016  . Lactic acid acidosis   . Sepsis ([place])   . 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 Essential hypertension   . Depression   . Right radial fracture 05/29/2015   PCP:  Juluis Pitch, MD Pharmacy:   [facility], [place] [address] Phone: [phone] Fax: [phone]  [facility], [place] [address] [place] [address] Phone: [phone] Fax: [place] [phone] [address], [place] [address] [place] [address] Phone: [phone] Fax: [phone]  [facility] - [address] [place], [place] [address] Phone: [phone] Fax: [phone]  [facility] [place], [place] - [place] [address] [place] [address] [place] [address] Phone: [phone] Fax: [phone]     Social Determinants of Health ([place]) Interventions    Readmission Risk Interventions Readmission Risk Prevention Plan 12/07/2018  Transportation Screening Complete  HRI or Home Care Consult Complete  Social Work Consult for [place]  Planning/Counseling Complete  Medication Review Press photographer) Complete  Some recent data might be hidden

## 2019-05-01 NOTE — Progress Notes (Signed)
Insulin pump was not provided by husband. MD made aware of husbands concerns regarding blood sugars.  MD consulting with patient and husband at this time.   Diabetes coordinator paged. Madlyn Frankel, RN

## 2019-05-01 NOTE — Progress Notes (Addendum)
Inpatient Diabetes Program Recommendations  AACE/ADA: New Consensus Statement on Inpatient Glycemic Control (2015)  Target Ranges:  Prepandial:   less than 140 mg/dL      Peak postprandial:   less than 180 mg/dL (1-2 hours)      Critically ill patients:  140 - 180 mg/dL   Lab Results  Component Value Date   GLUCAP 141 (H) 05/01/2019   HGBA1C 6.9 (H) 05/29/2018    Review of Glycemic Control  Diabetes history: :DM1 (makes NO insulin; requires basal, correction, and carbohydrate coverage insulin; dx in 1968) Outpatient Diabetes medications: Insulin pump Current orders for Inpatient glycemic control: Novolog sensitive correction tid + hs 0-5  Inpatient Diabetes Program Recommendations:   Spoke with patient and patient states she is unsure if husband had more insulin pump supplies to bring to insert insulin pump again or not since had to be removed due to the MRI. Reviewed with patient and RN Clance Boll that patient is type 1 and will need basal insulin if husband does not have another set. RN attempting to call husband to inquire about supplies.  If patient does not have insulin pump supplies: Consider: Lantus 10 units daily Novolog 3 units tid meal coverage if eats 50% Continue Novolog sensitive correction scale tid + hs 0-5 units Will follow during hospitalization. Spoke with patient, husband and RN @ bedside. Patient's husband shared that patient's insulin pump transmitter and sensor was discarded and disposed of in MRI last pm. DM coordinator spoke with unit director who went to MRI and located transmitter and took to patient. Patient and husband prefers patient to do subcutaneous insulin today and transition back to insulin pump in the morning. Spoke with Dr. Tressia Miners and she placed orders for Lantus 10 units + Novolog 3 units tid meal coverage + Novolog correction. Explained to patient and husband.  Thank you, Nani Gasser. Zakarie Sturdivant, RN, MSN, CDE  Diabetes  Coordinator Inpatient Glycemic Control Team Team Pager [phone] (8am-5pm) 05/01/2019 9:40 AM

## 2019-05-01 NOTE — Progress Notes (Signed)
PT Cancellation Note  Patient Details Name: Erin Daniels MRN: [id number] DOB: 1946/01/04   Cancelled Treatment:    Reason Eval/Treat Not Completed: Medical issues which prohibited therapy(Consult received and chart reviewed. Patient pending repeat head CT to evaluate stability of SAH.  Will hold PT evaluation until test complete, results received and patient cleared for exertional activity.)   Sayra Frisby H. Owens Shark, PT, DPT, NCS 05/01/19, 10:22 AM [phone]

## 2019-05-01 NOTE — Progress Notes (Signed)
Patient insulin pump removed by MRI, will be replaced this morning when husband arrives. Patient blood sugar at 0200 320, MD notified, novolog 8 units given per MD order, will continue to monitor.

## 2019-05-01 NOTE — Discharge Summary (Signed)
Erin Daniels at [place] NAME: Erin Daniels    MR#:  [id number]  DATE OF BIRTH:  Jan 19, 1946  DATE OF ADMISSION:  04/30/2019   ADMITTING PHYSICIAN: Erin Lighter, MD  DATE OF DISCHARGE:  05/01/19  PRIMARY CARE PHYSICIAN: Erin Pitch, MD   ADMISSION DIAGNOSIS:   SAH (subarachnoid hemorrhage) ([place]) [I60.9] Fall, initial encounter [W19.XXXA] Laceration of scalp, initial encounter [S01.01XA]  DISCHARGE DIAGNOSIS:   Active Problems:   Subarachnoid hematoma (HCC)   SECONDARY DIAGNOSIS:   Past Medical History:  Diagnosis Date   At high risk for falls    Basal cell carcinoma    right upper leg   Complication of anesthesia    difficult to arouse   Diabetes mellitus without complication (HCC)    Type I   Diabetic neuropathy (HCC)    Diabetic retinopathy (HCC)    Dizziness    Gastroparesis    Herniated disc, cervical    Hypercholesteremia    Hypertension    Hypotensive episode    Hypothyroidism    Insulin pump in place    Myofascial muscle pain    Numbness of arm    left   Osteoporosis    Poor vision    Seizures (HCC)    Slurred speech    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 female with a known history of type 1 diabetes mellitus on insulin pump, hypertension, hypothyroidism, diabetic retinopathy presents to hospital after a fall and bleeding from right side of her scalp.  1.  Fall and subarachnoid hemorrhage-mechanical fall at home. -Acute subarachnoid hemorrhage extending into the sylvian fissure and right temporoparietal lobe. -No neuro deficits at this time.   - Neurosurgery  recommended observation at this time.  No midline shift noted on CT head on admission. -Neurology consulted.  Patient had an MRI done to rule out any acute infarcts causing the fall as CT angiogram showed  significant severe right ICA stenosis and moderate left ICA stenosis which is increased since 2019.  No intracranial aneurysm or large vessel occlusion noted. -MRI of the brain did not show any acute strokes. -Repeat CT head after 24 hours from the initial CT showing stable mild right subarachnoid hemorrhage with extending into the right anterior sylvian fissure. -Hold her aspirin and Plavix at discharge -Follow-up scheduled with neurosurgery in 4 weeks for repeat CT.  At which point if stable, likely can resume her aspirin.  2.  Hypertension-on losartan  3.  Type 1 diabetes mellitus-restart insulin pump.  Appreciate diabetes coordinator input. -Because the pump was taken off for her imaging studies today, basal Lantus given today and patient can restart her pump tomorrow.  Continue to   give NovoLog today for meals.  4.  Macular degeneration-patient is legally blind in her left eye, has minimal vision in her right eye.  Continue eyedrops  5.  Peripheral neuropathy-on gabapentin  Ambulated with physical therapy.  Discharge home today   DISCHARGE CONDITIONS:   Guarded  CONSULTS OBTAINED:   Treatment Team:  Catarina Hartshorn, MD  Neuro surgery -[facility]  DRUG ALLERGIES:   Allergies  Allergen Reactions   Carbocaine [Mepivacaine Hcl] Palpitations    Low bp   Hydrocodone Other (See Comments)    Diabetic/  High sugars   Tussionex Pennkinetic Er [Hydrocod  Polst-Cpm Polst Er] Other (See Comments)    Diabetic/  High sugars   Codeine Nausea Only and Other (See Comments)    Reaction: stress attacks/ headache and nausea Can take Vicodin and Percocet    Meperidine Nausea Only and Other (See Comments)    Reaction: stress attacks/ headache and nausea   Metoclopramide Hcl Nausea Only and Other (See Comments)    Reaction: stress attacks/ headache    Stadol [Butorphanol] Nausea Only and Other (See Comments)    Reaction: stress attacks/ headache   DISCHARGE MEDICATIONS:    Allergies as of 05/01/2019      Reactions   Carbocaine [mepivacaine Hcl] Palpitations   Low bp   Hydrocodone Other (See Comments)   Diabetic/  High sugars   Tussionex Pennkinetic Er [hydrocod Polst-cpm Polst Er] Other (See Comments)   Diabetic/  High sugars   Codeine Nausea Only, Other (See Comments)   Reaction: stress attacks/ headache and nausea Can take Vicodin and Percocet    Meperidine Nausea Only, Other (See Comments)   Reaction: stress attacks/ headache and nausea   Metoclopramide Hcl Nausea Only, Other (See Comments)   Reaction: stress attacks/ headache    Stadol [butorphanol] Nausea Only, Other (See Comments)   Reaction: stress attacks/ headache      Medication List    STOP taking these medications   aspirin EC 81 MG tablet   butalbital-acetaminophen-caffeine 50-325-40 MG tablet Commonly known as: FIORICET   clopidogrel 75 MG tablet Commonly known as: PLAVIX   Jantoven 5 MG tablet Generic drug: warfarin   Jantoven 6 MG tablet Generic drug: warfarin   Jantoven 7.5 MG tablet Generic drug: warfarin   oxyCODONE 5 MG immediate release tablet Commonly known as: Roxicodone     TAKE these medications   acetaminophen 325 MG tablet Commonly known as: TYLENOL Take 2 tablets (650 mg total) by mouth every 6 (six) hours as needed for headache. For mild pain   atorvastatin 40 MG tablet Commonly known as: LIPITOR Take 1 tablet (40 mg total) by mouth daily. What changed: when to take this   Calcium 600 + D 600-200 MG-UNIT Tabs Generic drug: Calcium Carb-Cholecalciferol Take 1 tablet by mouth every evening.   carboxymethylcellulose 0.5 % Soln Commonly known as: REFRESH PLUS Place 1 drop into both eyes as needed (for dry eyes).   CEROVITE ADVANCED FORMULA PO Take 1 tablet by mouth daily.   cetirizine 10 MG tablet Commonly known as: ZYRTEC Take 1 tablet (10 mg total) by mouth daily.   Combigan 0.2-0.5 % ophthalmic solution Generic drug:  brimonidine-timolol Place 1 drop into both eyes every 12 (twelve) hours. Instill one drop in right eye morning, noon and bedtime   dorzolamide-timolol 22.3-6.8 MG/ML ophthalmic solution Commonly known as: COSOPT Place 1 drop into both eyes daily.   ferrous sulfate 325 (65 FE) MG tablet Take 325 mg by mouth daily with breakfast.   gabapentin 300 MG capsule Commonly known as: NEURONTIN 300 mg in the morning, 300 mg in the afternoon, 900 mg in the evening What changed:   how much to take  how to take this  when to take this   Glucagon Emergency 1 MG injection Generic drug: glucagon Inject 1 mg into the muscle once.   glucose 4 GM chewable tablet Chew 1 tablet (4 g total) by mouth as needed for low blood sugar. <65   insulin aspart 100 UNIT/ML FlexPen Commonly known as: NovoLOG FlexPen Inject 0-15 Units into the skin 3 (three) times daily  with meals. Inject 0-15 units subcutaneous per sliding scale before meals and at bedtime 0 - 100 units = 0 units 101 - 120 = 1 unit 121 - 150 = 2 units 151 - 200 = 4 units 201 - 250 = 6 units 251 - 300 = 8 units 301 - 350 = 10 units 351 - 400 = 12 units 401 - 450 = 15 units > 450 call MD   losartan 50 MG tablet Commonly known as: COZAAR Take 1 tablet (50 mg total) by mouth daily for 30 days.   NON FORMULARY Diet type:  NCS   olmesartan 20 MG tablet Commonly known as: BENICAR Take 20 mg by mouth daily.   polyethylene glycol 17 g packet Commonly known as: MIRALAX / GLYCOLAX Take 17 g by mouth daily as needed.   promethazine 25 MG suppository Commonly known as: PHENERGAN Place 25 mg rectally every 6 (six) hours as needed for nausea or vomiting.   Rocklatan 0.02-0.005 % Soln Generic drug: Netarsudil-Latanoprost Apply 1 drop to eye at bedtime. Instill one drop in right eye at bedtime   senna 8.6 MG Tabs tablet Commonly known as: SENOKOT Take 2 tablets by mouth daily as needed.   venlafaxine 75 MG tablet Commonly known as:  EFFEXOR Take 1 tablet (75 mg total) by mouth 2 (two) times daily.   vitamin C 500 MG tablet Commonly known as: ASCORBIC ACID Take 500 mg by mouth daily.   Vitamin D-3 125 MCG (5000 UT) Tabs Take 5,000 Units by mouth daily.        DISCHARGE INSTRUCTIONS:   1.  PCP follow-up in 1 to 2 weeks 2.  Neurosurgery follow-up in 4 weeks for repeat CT head  DIET:   Cardiac diet  ACTIVITY:   Activity as tolerated  OXYGEN:   Home Oxygen: No.  Oxygen Delivery: room air  DISCHARGE LOCATION:   home   If you experience worsening of your admission symptoms, develop shortness of breath, life threatening emergency, suicidal or homicidal thoughts you must seek medical attention immediately by calling 911 or calling your MD immediately  if symptoms less severe.  You Must read complete instructions/literature along with all the possible adverse reactions/side effects for all the Medicines you take and that have been prescribed to you. Take any new Medicines after you have completely understood and accpet all the possible adverse reactions/side effects.   Please note  You were cared for by a hospitalist during your hospital stay. If you have any questions about your discharge medications or the care you received while you were in the hospital after you are discharged, you can call the unit and asked to speak with the hospitalist on call if the hospitalist that took care of you is not available. Once you are discharged, your primary care physician will handle any further medical issues. Please note that NO REFILLS for any discharge medications will be authorized once you are discharged, as it is imperative that you return to your primary care physician (or establish a relationship with a primary care physician if you do not have one) for your aftercare needs so that they can reassess your need for medications and monitor your lab values.    On the day of Discharge:  VITAL SIGNS:   Blood  pressure (!) 101/50, pulse 78, temperature 98.4 F (36.9 C), temperature source Oral, resp. rate 16, height 5\' 1"  (1.549 m), weight 45.4 kg, SpO2 97 %.  PHYSICAL EXAMINATION:    GENERAL:  73  y.o.-year-old patient lying in the bed with no acute distress.  EYES: Pupils  round, reactive to light and accommodation.  Mildly increased dilation of the right pupil, no scleral icterus. Extraocular muscles intact.  HEENT: Head  normocephalic. Right temporal bleed with 2 staples in. Oropharynx and nasopharynx clear.  NECK:  Supple, no jugular venous distention. No thyroid enlargement, no tenderness.  LUNGS: Normal breath sounds bilaterally, no wheezing, rales,rhonchi or crepitation. No use of accessory muscles of respiration.  CARDIOVASCULAR: S1, S2 normal. No  rubs, or gallops. 2/6 systolic murmur present ABDOMEN: Soft, nontender, nondistended. Bowel sounds present. No organomegaly or mass.  EXTREMITIES: No pedal edema, cyanosis, or clubbing.  NEUROLOGIC: Cranial nerves II through XII are intact. Muscle strength 5/5 in all extremities. Sensation intact. Gait not checked.  PSYCHIATRIC: The patient is alert and oriented x 3.  SKIN: No obvious rash, lesion, or ulcer.   DATA REVIEW:   CBC Recent Labs  Lab 05/01/19 0517  WBC 6.4  HGB 9.4*  HCT 28.5*  PLT 234    Chemistries  Recent Labs  Lab 04/30/19 0701 05/01/19 0517  NA 141 138  K 3.9 4.0  CL 102 104  CO2 27 26  GLUCOSE 178* 150*  BUN 30* 27*  CREATININE 0.70 0.63  CALCIUM 9.8 8.8*  MG 2.5*  --   AST 22  --   ALT 16  --   ALKPHOS 80  --   BILITOT 0.4  --      Microbiology Results  Results for orders placed or performed during the hospital encounter of 04/30/19  Urine culture     Status: None   Collection Time: 04/30/19  9:01 AM   Specimen: Urine, Random  Result Value Ref Range Status   Specimen Description   Final    URINE, RANDOM Performed at [facility], [address], [place], [place] [address]     Special Requests   Final    NONE Performed at [facility], [address], [place], [place] [address]    Culture   Final    NO GROWTH Performed at [facility], [place] [address] [place], [place] [address]    Report Status 05/01/2019 FINAL  Final  SARS Coronavirus 2 [facility] order, Performed in [facility] hospital lab) Nasopharyngeal     Status: None   Collection Time: 04/30/19  9:42 AM   Specimen: Nasopharyngeal  Result Value Ref Range Status   SARS Coronavirus 2 NEGATIVE NEGATIVE Final    Comment: (NOTE) If result is NEGATIVE SARS-CoV-2 target nucleic acids are NOT DETECTED. The SARS-CoV-2 RNA is generally detectable in upper and lower  respiratory specimens during the acute phase of infection. The lowest  concentration of SARS-CoV-2 viral copies this assay can detect is 250  copies / mL. A negative result does not preclude SARS-CoV-2 infection  and should not be used as the sole basis for treatment or other  patient management decisions.  A negative result may occur with  improper specimen collection / handling, submission of specimen other  than nasopharyngeal swab, presence of viral mutation(s) within the  areas targeted by this assay, and inadequate number of viral copies  (<250 copies / mL). A negative result must be combined with clinical  observations, patient history, and epidemiological information. If result is POSITIVE SARS-CoV-2 target nucleic acids are DETECTED. The SARS-CoV-2 RNA is generally detectable in upper and lower  respiratory specimens dur ing the acute phase of infection.  Positive  results are  indicative of active infection with SARS-CoV-2.  Clinical  correlation with patient history and other diagnostic information is  necessary to determine patient infection status.  Positive results do  not rule out bacterial infection or co-infection with other viruses. If result is PRESUMPTIVE POSTIVE SARS-CoV-2 nucleic acids MAY BE PRESENT.   A  presumptive positive result was obtained on the submitted specimen  and confirmed on repeat testing.  While 2019 novel coronavirus  (SARS-CoV-2) nucleic acids may be present in the submitted sample  additional confirmatory testing may be necessary for epidemiological  and / or clinical management purposes  to differentiate between  SARS-CoV-2 and other Sarbecovirus currently known to infect humans.  If clinically indicated additional testing with an alternate test  methodology [phone]) is advised. The SARS-CoV-2 RNA is generally  detectable in upper and lower respiratory sp ecimens during the acute  phase of infection. The expected result is Negative. Fact Sheet for Patients:  StrictlyIdeas.no Fact Sheet for Healthcare Providers: BankingDealers.co.za This test is not yet approved or cleared by the [place] FDA and has been authorized for detection and/or diagnosis of SARS-CoV-2 by FDA under an Emergency Use Authorization (EUA).  This EUA will remain in effect (meaning this test can be used) for the duration of the COVID-19 declaration under Section 564(b)(1) of the Act, 21 U.S.C. section 360bbb-3(b)(1), unless the authorization is terminated or revoked sooner. Performed at [facility], [address] [place], [place] [address]     RADIOLOGY:  Ct Head Wo Contrast  Result Date: 05/01/2019 CLINICAL DATA:  Follow-up subarachnoid hemorrhage. EXAM: CT HEAD WITHOUT CONTRAST TECHNIQUE: Contiguous axial images were obtained from the base of the skull through the vertex without intravenous contrast. COMPARISON:  CT head 04/30/2019 FINDINGS: Brain: Hyperdense hemorrhage in the right sylvian fissure anteriorly is unchanged. No new hemorrhage. No ventricular hemorrhage or hydrocephalus. Generalized atrophy with chronic white matter changes. No acute infarct or mass. Vascular: Atherosclerotic calcification distal left vertebral artery  and bilateral cavernous carotid arteries. Negative for hyperdense vessel Skull: Negative for fracture. Right parietal scalp laceration with staples Sinuses/Orbits: Paranasal sinuses clear.  Bilateral ocular surgery Other: None IMPRESSION: Stable mild subarachnoid hemorrhage right anterior sylvian fissure. No new area of hemorrhage or hydrocephalus. Atrophy and chronic microvascular ischemic change in the white matter Electronically Signed   By: Franchot Gallo M.D.   On: 05/01/2019 14:55   Mr Brain Wo Contrast  Result Date: 04/30/2019 CLINICAL DATA:  Subarachnoid hemorrhage follow up EXAM: MRI HEAD WITHOUT CONTRAST TECHNIQUE: Multiplanar, multiecho pulse sequences of the brain and surrounding structures were obtained without intravenous contrast. COMPARISON:  CTA head 04/30/2019 Brain MRI 05/29/2018 FINDINGS: BRAIN: Small volume subarachnoid hemorrhage along the right insula and frontal operculum. Early confluent hyperintense T2-weighted signal of the periventricular and deep white matter, most commonly due to chronic ischemic microangiopathy. There is generalized atrophy without lobar predilection. The midline structures are normal. VASCULAR: The major intracranial arterial and venous sinus flow voids are normal. Multiple chronic microhemmorhages in a predominantly central distribution. SKULL AND UPPER CERVICAL SPINE: Mild spinal canal narrowing at the C3-4 levels. SINUSES/ORBITS: There are no fluid levels or advanced mucosal thickening. The mastoid air cells and middle ear cavities are free of fluid. The orbits are normal. IMPRESSION: 1. Small volume subarachnoid hemorrhage along the right sylvian fissure, unchanged. 2. No acute ischemia. 3. Chronic small vessel ischemia and multiple chronic microhemorrhages most consistent with chronic hypertensive angiopathy. Electronically Signed   By: Ulyses Jarred M.D.   On: 04/30/2019  22:47     Management plans discussed with the patient, family and they are in  agreement.  CODE STATUS:     Code Status Orders  (From admission, onward)         Start     Ordered   04/30/19 1234  Full code  Continuous     04/30/19 1233        Code Status History    Date Active Date Inactive Code Status Order ID Comments User Context   01/09/2019 1535 01/09/2019 1944 Full Code [id number]  Corky Mull, MD Inpatient   12/06/2018 2300 12/10/2018 1558 Full Code [id number]  Thornton Park, MD Inpatient   12/06/2018 2017 12/06/2018 2300 Full Code [id number]  Sela Hua, MD Inpatient   09/29/2018 1201 09/30/2018 1823 DNR [id number]  Demetrios Loll, MD Inpatient   09/15/2018 2314 09/18/2018 2150 Full Code [id number]  Quintella Baton, MD Inpatient   11/11/2017 0648 11/13/2017 1700 Full Code [id number]  Harrie Foreman, MD Inpatient   11/26/2016 2325 11/30/2016 1850 Full Code [id number]  Sid Falcon, MD ED   05/29/2015 1352 05/31/2015 1522 Full Code [id number]  Roseanne Kaufman, MD Inpatient   Advance Care Planning Activity      TOTAL TIME TAKING CARE OF THIS PATIENT: 38 minutes.    Erin Daniels M.D on 05/01/2019 at 3:49 PM  Between 7am to 6pm - Pager - [phone]  After 6pm go to [email or web address] - Proofreader  [employer] [facility]  Office  [phone]  CC: Primary care physician; Erin Pitch, MD   Note: This dictation was prepared with Dragon dictation along with smaller phrase technology. Any transcriptional errors that result from this process are unintentional.

## 2019-05-01 NOTE — Progress Notes (Signed)
Subjective: Patient awake and alert today, eating breakfast.  Reports feeling better.    Objective: Current vital signs: BP 117/63 (BP Location: Right Arm)   Pulse 83   Temp (!) 97.5 F (36.4 C) (Oral)   Resp 17   Ht 5\' 1"  (1.549 m)   Wt 45.4 kg   SpO2 97%   BMI 18.89 kg/m  Vital signs in last 24 hours: Temp:  [97.3 F (36.3 C)-98 F (36.7 C)] 97.5 F (36.4 C) (09/10 0815) Pulse Rate:  [63-83] 83 (09/10 0815) Resp:  [16-17] 17 (09/10 0815) BP: (110-164)/(44-63) 117/63 (09/10 0815) SpO2:  [94 %-98 %] 97 % (09/10 0815)  Intake/Output from previous day: No intake/output data recorded. Intake/Output this shift: Total I/O In: 120 [P.O.:120] Out: -  Nutritional status:  Diet Order            Diet heart healthy/carb modified Room service appropriate? Yes; Fluid consistency: Thin  Diet effective now              Neurologic Exam: Mental Status: Awake and alert.  Speech fluent without evidence of aphasia.  Able to follow 3 step commands without difficulty. Cranial Nerves: II: Visual fields grossly normal, pupils irregular and poorly reactive bilaterally III,IV, VI: ptosis not present, extra-ocular motions intact bilaterally V,VII: smile symmetric, facial light touch sensation normal bilaterally VIII: hearing normal bilaterally IX,X: gag reflex present XI: bilateral shoulder shrug XII: midline tongue extension Motor: Right :  Upper extremity   5/5                                      Left:     Upper extremity   5/5             Lower extremity   5/5                                                  Lower extremity   5/5 Tone and bulk:normal tone throughout; no atrophy noted Sensory: Pinprick and light touch intact throughout, bilaterally   Lab Results: Basic Metabolic Panel: Recent Labs  Lab 04/30/19 0701 05/01/19 0517  NA 141 138  K 3.9 4.0  CL 102 104  CO2 27 26  GLUCOSE 178* 150*  BUN 30* 27*  CREATININE 0.70 0.63  CALCIUM 9.8 8.8*  MG 2.5*  --      Liver Function Tests: Recent Labs  Lab 04/30/19 0701  AST 22  ALT 16  ALKPHOS 80  BILITOT 0.4  PROT 7.4  ALBUMIN 4.3   No results for input(s): LIPASE, AMYLASE in the last 168 hours. No results for input(s): AMMONIA in the last 168 hours.  CBC: Recent Labs  Lab 04/30/19 0701 05/01/19 0517  WBC 12.6* 6.4  NEUTROABS 9.7*  --   HGB 10.9* 9.4*  HCT 33.4* 28.5*  MCV 93.6 93.4  PLT 268 234    Cardiac Enzymes: No results for input(s): CKTOTAL, CKMB, CKMBINDEX, TROPONINI in the last 168 hours.  Lipid Panel: No results for input(s): CHOL, TRIG, HDL, CHOLHDL, VLDL, LDLCALC in the last 168 hours.  CBG: Recent Labs  Lab 04/30/19 2104 05/01/19 0210 05/01/19 0212 05/01/19 0724 05/01/19 1011  GLUCAP 185* 331* 320* 141* 320*    Microbiology: Results for orders placed or  performed during the hospital encounter of 04/30/19  Urine culture     Status: None   Collection Time: 04/30/19  9:01 AM   Specimen: Urine, Random  Result Value Ref Range Status   Specimen Description   Final    URINE, RANDOM Performed at [facility], [address] [place], [place] [address]    Special Requests   Final    NONE Performed at [facility], [address], [place], [place] [address]    Culture   Final    NO GROWTH Performed at [facility], [place] [address], [place], [place] [address]    Report Status 05/01/2019 FINAL  Final  SARS Coronavirus 2 [facility] order, Performed in [facility] hospital lab) Nasopharyngeal     Status: None   Collection Time: 04/30/19  9:42 AM   Specimen: Nasopharyngeal  Result Value Ref Range Status   SARS Coronavirus 2 NEGATIVE NEGATIVE Final    Comment: (NOTE) If result is NEGATIVE SARS-CoV-2 target nucleic acids are NOT DETECTED. The SARS-CoV-2 RNA is generally detectable in upper and lower  respiratory specimens during the acute phase of infection. The lowest  concentration of SARS-CoV-2 viral copies this assay can  detect is 250  copies / mL. A negative result does not preclude SARS-CoV-2 infection  and should not be used as the sole basis for treatment or other  patient management decisions.  A negative result may occur with  improper specimen collection / handling, submission of specimen other  than nasopharyngeal swab, presence of viral mutation(s) within the  areas targeted by this assay, and inadequate number of viral copies  (<250 copies / mL). A negative result must be combined with clinical  observations, patient history, and epidemiological information. If result is POSITIVE SARS-CoV-2 target nucleic acids are DETECTED. The SARS-CoV-2 RNA is generally detectable in upper and lower  respiratory specimens dur ing the acute phase of infection.  Positive  results are indicative of active infection with SARS-CoV-2.  Clinical  correlation with patient history and other diagnostic information is  necessary to determine patient infection status.  Positive results do  not rule out bacterial infection or co-infection with other viruses. If result is PRESUMPTIVE POSTIVE SARS-CoV-2 nucleic acids MAY BE PRESENT.   A presumptive positive result was obtained on the submitted specimen  and confirmed on repeat testing.  While 2019 novel coronavirus  (SARS-CoV-2) nucleic acids may be present in the submitted sample  additional confirmatory testing may be necessary for epidemiological  and / or clinical management purposes  to differentiate between  SARS-CoV-2 and other Sarbecovirus currently known to infect humans.  If clinically indicated additional testing with an alternate test  methodology [id number]) is advised. The SARS-CoV-2 RNA is generally  detectable in upper and lower respiratory sp ecimens during the acute  phase of infection. The expected result is Negative. Fact Sheet for Patients:  StrictlyIdeas.no Fact Sheet for Healthcare  Providers: BankingDealers.co.za This test is not yet approved or cleared by the [place] FDA and has been authorized for detection and/or diagnosis of SARS-CoV-2 by FDA under an Emergency Use Authorization (EUA).  This EUA will remain in effect (meaning this test can be used) for the duration of the COVID-19 declaration under Section 564(b)(1) of the Act, 21 U.S.C. section 360bbb-3(b)(1), unless the authorization is terminated or revoked sooner. Performed at [facility], [address] [place], [place] [address]     Coagulation Studies: Recent Labs    04/30/19 [phone]  LABPROT 12.9  INR 1.0    Imaging: Ct Angio Head W Or Wo Contrast  Result Date: 04/30/2019 CLINICAL DATA:  73 year old female with recent fall and headaches. Right sylvian fissure subarachnoid hemorrhage on head CT at 0708 hours today. EXAM: CT ANGIOGRAPHY HEAD TECHNIQUE: Multidetector CT imaging of the head was performed using the standard protocol during bolus administration of intravenous contrast. Multiplanar CT image reconstructions and MIPs were obtained to evaluate the vascular anatomy. CONTRAST:  90mL OMNIPAQUE IOHEXOL 350 MG/ML SOLN COMPARISON:  Head and cervical spine CT earlier today. CTA head and neck 05/28/2018, and earlier. FINDINGS: Posterior circulation: Dominant left vertebral artery, the right terminates in PICA as before. Mild left V4 calcified plaque without stenosis. Normal left PICA origin. Patent and mildly tortuous basilar artery without stenosis. Normal SCA and PCA origins. Small left posterior communicating artery is present, the right is diminutive or absent. Bilateral PCA branches are stable and within normal limits. Anterior circulation: Stable visible distal cervical ICAs, the left appears mildly dominant. Both ICA siphons remain patent. Chronic heavy calcification of both siphons. On the left mild to moderate supraclinoid segment stenosis appears increased since  2019 on series 11, image 91. On the right there is severe supraclinoid stenosis due to calcified plaque on series 7, image 56 which appears chronic but may be mildly increased since 2019 (previous series 6, image 254). Both carotid termini remain patent. The left is dominant along with the left ACA A1 segment. The right A1 is diminutive or absent. Normal MCA and left ACA origins. Anterior communicating artery is within normal limits, although the left A2 remains dominant simulating azygos ACA anatomy. Left MCA M1 segment and trifurcation are patent without stenosis. Left MCA branches are stable. There is minimal left M2 irregularity on series 12, image 25. Right M1 and right MCA bifurcation are patent without irregularity or stenosis. Right MCA branches are stable aside from minimal irregularity of the dominant posterior M2 division which might reflect mild vasospasm in this clinical setting (series 12, image 9). Negative for right MCA aneurysm. Venous sinuses: Early contrast timing, the superior sagittal sinus is patent. Anatomic variants: Dominant left and diminutive or absent right ACA A1 segments. Dominant left A2. Dominant left ICA. Dominant left vertebral artery, the right terminates in PICA. Review of the MIP images confirms the above findings IMPRESSION: 1. Negative for intracranial aneurysm or large vessel occlusion. 2. 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 3. Possible minimal vasospasm of the right MCA. 4. Otherwise stable intracranial CTA since 2019, with no other intracranial stenosis. Electronically Signed   By: Genevie Ann M.D.   On: 04/30/2019 10:00   Ct Head Wo Contrast  Result Date: 04/30/2019 CLINICAL DATA:  Recent fall with headaches and neck pain, initial encounter EXAM: CT HEAD WITHOUT CONTRAST CT CERVICAL SPINE WITHOUT CONTRAST TECHNIQUE: Multidetector CT imaging of the head and cervical  spine was performed following the standard protocol without intravenous contrast. Multiplanar CT image reconstructions of the cervical spine were also generated. COMPARISON:  12/06/2018 FINDINGS: CT HEAD FINDINGS Brain: Mild subarachnoid hemorrhage is noted on the right involving the sylvian fissure and also in the posterior temporoparietal region on the right. Generalized atrophic changes are noted. Chronic white matter ischemic change is seen. Vascular: No hyperdense vessel or unexpected calcification. Skull: Normal. Negative for fracture or focal lesion. Sinuses/Orbits: No acute finding. Other: None. CT CERVICAL SPINE FINDINGS Alignment: Loss of  normal cervical lordosis is noted. Anterolisthesis of C3 on C4 is noted of a degenerative nature. Skull base and vertebrae: 7 cervical segments are well visualized. Vertebral body height is well maintained. Multilevel facet hypertrophic changes are seen. Disc space narrowing is noted throughout the cervical spine with associated osteophytic changes. No acute fracture or acute facet abnormality is noted. Soft tissues and spinal canal: Surrounding soft tissues are stable in appearance. Upper chest: Visualized lung apices are within normal limits Other: None IMPRESSION: CT of the head: Acute subarachnoid hemorrhage involving the sylvian fissure and temporoparietal region on the right. Chronic atrophic and white matter ischemic changes are noted as well. CT of the cervical spine: Multilevel degenerative change relatively stable from the prior study. Critical Value/emergent results were called by telephone at the time of interpretation on 04/30/2019 at 7:54 am to Dr. Nickolas Madrid , who verbally acknowledged these results. Electronically Signed   By: Inez Catalina M.D.   On: 04/30/2019 07:56   Ct Cervical Spine Wo Contrast  Result Date: 04/30/2019 CLINICAL DATA:  Recent fall with headaches and neck pain, initial encounter EXAM: CT HEAD WITHOUT CONTRAST CT CERVICAL SPINE WITHOUT  CONTRAST TECHNIQUE: Multidetector CT imaging of the head and cervical spine was performed following the standard protocol without intravenous contrast. Multiplanar CT image reconstructions of the cervical spine were also generated. COMPARISON:  12/06/2018 FINDINGS: CT HEAD FINDINGS Brain: Mild subarachnoid hemorrhage is noted on the right involving the sylvian fissure and also in the posterior temporoparietal region on the right. Generalized atrophic changes are noted. Chronic white matter ischemic change is seen. Vascular: No hyperdense vessel or unexpected calcification. Skull: Normal. Negative for fracture or focal lesion. Sinuses/Orbits: No acute finding. Other: None. CT CERVICAL SPINE FINDINGS Alignment: Loss of normal cervical lordosis is noted. Anterolisthesis of C3 on C4 is noted of a degenerative nature. Skull base and vertebrae: 7 cervical segments are well visualized. Vertebral body height is well maintained. Multilevel facet hypertrophic changes are seen. Disc space narrowing is noted throughout the cervical spine with associated osteophytic changes. No acute fracture or acute facet abnormality is noted. Soft tissues and spinal canal: Surrounding soft tissues are stable in appearance. Upper chest: Visualized lung apices are within normal limits Other: None IMPRESSION: CT of the head: Acute subarachnoid hemorrhage involving the sylvian fissure and temporoparietal region on the right. Chronic atrophic and white matter ischemic changes are noted as well. CT of the cervical spine: Multilevel degenerative change relatively stable from the prior study. Critical Value/emergent results were called by telephone at the time of interpretation on 04/30/2019 at 7:54 am to Dr. Nickolas Madrid , who verbally acknowledged these results. Electronically Signed   By: Inez Catalina M.D.   On: 04/30/2019 07:56   Mr Brain Wo Contrast  Result Date: 04/30/2019 CLINICAL DATA:  Subarachnoid hemorrhage follow up EXAM: MRI HEAD WITHOUT  CONTRAST TECHNIQUE: Multiplanar, multiecho pulse sequences of the brain and surrounding structures were obtained without intravenous contrast. COMPARISON:  CTA head 04/30/2019 Brain MRI 05/29/2018 FINDINGS: BRAIN: Small volume subarachnoid hemorrhage along the right insula and frontal operculum. Early confluent hyperintense T2-weighted signal of the periventricular and deep white matter, most commonly due to chronic ischemic microangiopathy. There is generalized atrophy without lobar predilection. The midline structures are normal. VASCULAR: The major intracranial arterial and venous sinus flow voids are normal. Multiple chronic microhemmorhages in a predominantly central distribution. SKULL AND UPPER CERVICAL SPINE: Mild spinal canal narrowing at the C3-4 levels. SINUSES/ORBITS: There are no fluid levels or advanced mucosal thickening.  The mastoid air cells and middle ear cavities are free of fluid. The orbits are normal. IMPRESSION: 1. Small volume subarachnoid hemorrhage along the right sylvian fissure, unchanged. 2. No acute ischemia. 3. Chronic small vessel ischemia and multiple chronic microhemorrhages most consistent with chronic hypertensive angiopathy. Electronically Signed   By: Ulyses Jarred M.D.   On: 04/30/2019 22:47    Medications:  I have reviewed the patient's current medications. Scheduled: . atorvastatin  40 mg Oral q1800  . brimonidine  1 drop Right Eye TID   And  . timolol  1 drop Right Eye TID  . cholecalciferol  5,000 Units Oral Daily  . ferrous sulfate  325 mg Oral Q breakfast  . gabapentin  300 mg Oral BID   And  . gabapentin  900 mg Oral QHS  . insulin aspart  0-5 Units Subcutaneous QHS  . insulin aspart  0-9 Units Subcutaneous TID WC  . insulin pump   Subcutaneous TID AC, HS, 0200  . loratadine  10 mg Oral Daily  . losartan  50 mg Oral Daily  . multivitamin with minerals  1 tablet Oral Daily  . Netarsudil-Latanoprost  1 drop Ophthalmic QHS  . venlafaxine  75 mg Oral  BID    Assessment/Plan: Patient improved today.  Lethargy yesterday likely secondary to medications.  MRI of the brain reviewed and shows no evidence of acute infarct.  SAH noted.  EEG unremarkable.  Patient to have f/u head CT today.    Recommendations: 1. If repeat head CT shows no worsening of SAH, no further neurologic intervention is recommended at this time.  If further questions arise, please call or page at that time.  Thank you for allowing neurology to participate in the care of this patient.   LOS: 1 day   Alexis Goodell, MD Neurology [phone] 05/01/2019  10:42 AM

## 2019-05-01 NOTE — Evaluation (Signed)
Physical Therapy Evaluation Patient Details Name: Erin Daniels MRN: [id number] DOB: 1946-05-13 Today's Date: 05/01/2019   History of Present Illness  presented to ER secondary to fall with R scalp laceration; admitted for management of small-volume SAH (R sylvian fissure).  Repeat CT showing good stability, unchanged since initial image.  Clinical Impression  Upon evaluation, patient alert and oriented to basic information; follows commands and demonstrates fair insight/safety awareness into mobility.  Rates headache 6/10 (meds requested per RN); otherwise, denies pain at this time.  Bilat UE/LE strength and ROM grossly symmetrical and WFL; no focal weakness, sensory or coordination deficit.  Able to complete bed mobility with mod indep; sit/stand, basic transfers and gait (200') with RW, cga/min assist.  Demonstrates mixed step to vs step through gait pattern; mildly antalgic (baseline wears R heel lift); veers left at times, min cuing for correction; mild instability with dynamic gait components (head turns, start stop, changes of direction).  Recommend continued use of RW and +1 for optimal safety/indep Would benefit from skilled PT to address above deficits and promote optimal return to PLOF; Recommend transition to [place] upon discharge from acute hospitalization.     Follow Up Recommendations Home health PT    Equipment Recommendations       Recommendations for Other Services       Precautions / Restrictions Precautions Precautions: Fall Restrictions Weight Bearing Restrictions: No      Mobility  Bed Mobility Overal bed mobility: Modified Independent                Transfers Overall transfer level: Needs assistance Equipment used: Rolling walker (2 wheeled) Transfers: Sit to/from Stand Sit to Stand: Supervision;Min guard         General transfer comment: cuing for hand placement  Ambulation/Gait Ambulation/Gait assistance: Min guard;Min assist Gait  Distance (Feet): 200 Feet Assistive device: Rolling walker (2 wheeled)       General Gait Details: mixed step to vs step through gait pattern; mildly antalgic (baseline wears R heel lift); veers left at times, min cuing for correction; mild instability with dynamic gait components (head turns, start stop, changes of direction).  Recommend continued use of RW and +1 for optimal safety/indep  Stairs            Wheelchair Mobility    Modified Rankin (Stroke Patients Only)       Balance Overall balance assessment: Needs assistance Sitting-balance support: No upper extremity supported;Feet supported Sitting balance-Leahy Scale: Good     Standing balance support: No upper extremity supported Standing balance-Leahy Scale: Poor Standing balance comment: absent functional reach outside immediate BOS without single UE support                             Pertinent Vitals/Pain Pain Assessment: Faces Faces Pain Scale: Hurts even more Pain Location: headache Pain Descriptors / Indicators: Aching;Guarding;Grimacing Pain Intervention(s): Limited activity within patient's tolerance;Monitored during session;Repositioned    Home Living Family/patient expects to be discharged to:: Private residence Living Arrangements: Spouse/significant other Available Help at Discharge: Family;Available 24 hours/day Type of Home: House Home Access: Stairs to enter Entrance Stairs-Rails: Can reach both;Left;Right Entrance Stairs-Number of Steps: 2 Home Layout: One level Home Equipment: Walker - 2 wheels      Prior Function Level of Independence: Independent with assistive device(s)         Comments: Mod indep with RW for ADLs, household and limited community mobilization; husband assists with household  chores, driving and community errands as needed.  Does endorse multiple fall history, at least 3-4 in previous year.     Hand Dominance        Extremity/Trunk Assessment    Upper Extremity Assessment Upper Extremity Assessment: Overall WFL for tasks assessed(grossly at least 4/5 throughout; no focal weakness, sensory or coordination deficit noted)    Lower Extremity Assessment Lower Extremity Assessment: Overall WFL for tasks assessed(grossly at least 4/5 throughout; no focal weakness, sensory or coordination deficit noted)       Communication   Communication: (mildly dysarthric (baseline per patient/husband))  Cognition Arousal/Alertness: Awake/alert Behavior During Therapy: WFL for tasks assessed/performed Overall Cognitive Status: Within Functional Limits for tasks assessed                                        General Comments      Exercises Other Exercises Other Exercises: Toilet transfer, ambulatory with RW, cga/close sup.  Sit/stand from standard toilet height, cga/close sup.  Hand hygiene at sink, close sup; single UE support for any movement outside immediate BOS   Assessment/Plan    PT Assessment Patient needs continued PT services  PT Problem List Decreased activity tolerance;Decreased balance;Decreased mobility;Decreased coordination;Decreased cognition;Decreased knowledge of use of DME;Decreased safety awareness       PT Treatment Interventions DME instruction;Gait training;Stair training;Functional mobility training;Therapeutic activities;Therapeutic exercise;Balance training;Neuromuscular re-education;Cognitive remediation;Patient/family education    PT Goals (Current goals can be found in the Care Plan section)  Acute Rehab PT Goals Patient Stated Goal: to return home today PT Goal Formulation: With patient/family Time For Goal Achievement: 05/15/19 Potential to Achieve Goals: Fair    Frequency Min 2X/week   Barriers to discharge        Co-evaluation               AM-PAC PT "6 Clicks" Mobility  Outcome Measure Help needed turning from your back to your side while in a flat bed without using  bedrails?: None Help needed moving from lying on your back to sitting on the side of a flat bed without using bedrails?: None Help needed moving to and from a bed to a chair (including a wheelchair)?: None Help needed standing up from a chair using your arms (e.g., wheelchair or bedside chair)?: A Little Help needed to walk in hospital room?: A Little Help needed climbing 3-5 steps with a railing? : A Little 6 Click Score: 21    End of Session Equipment Utilized During Treatment: Gait belt Activity Tolerance: Patient tolerated treatment well Patient left: in bed;with call bell/phone within reach;with bed alarm set;with family/visitor present Nurse Communication: Mobility status PT Visit Diagnosis: Muscle weakness (generalized) (M62.81);Difficulty in walking, not elsewhere classified (R26.2)    Time: [id number] PT Time Calculation (min) (ACUTE ONLY): 21 min   Charges:   PT Evaluation $PT Eval Moderate Complexity: 1 Mod PT Treatments $Therapeutic Activity: 8-22 mins        Mouhamed Glassco H. Owens Shark, PT, DPT, NCS 05/01/19, 4:36 PM [phone]

## 2019-05-01 NOTE — Procedures (Signed)
ELECTROENCEPHALOGRAM REPORT   Patient: Erin Daniels       Room #: 113A-AA EEG No. ID: [id number] Age: 73 y.o.        Sex: female Referring Physician:  Tressia Miners Report Date:  05/01/2019        Interpreting Physician: Alexis Goodell  History: Erin Daniels is an 73 y.o. female with unexplained fall  Medications:  Lipitor, Neurontin, Cozaar, Claritin, MVI, Effexor  Conditions of Recording:  This is a 21 channel routine scalp EEG performed with bipolar and monopolar montages arranged in accordance to the international 10/20 system of electrode placement. One channel was dedicated to EKG recording.  The patient is in the awake, drowsy and asleep states.  Description:  The waking background activity consists of a low voltage, symmetrical, fairly well organized, 7-8 Hz theta activity, seen from the parieto-occipital and posterior temporal regions.  Low voltage fast activity, poorly organized, is seen anteriorly and is at times superimposed on more posterior regions.  A mixture of theta and alpha rhythms are seen from the central and temporal regions. The patient drowses with slowing to irregular, low voltage theta and beta activity.   The patient goes in to a light sleep with symmetrical sleep spindles, vertex central sharp transients and irregular slow activity.  No epileptiform activity is noted.   Hyperventilation was not performed.  Intermittent photic stimulation was performed but failed to illicit any change in the tracing.    IMPRESSION: This is a mildly abnormal EEG secondary to mild posterior background slowing.  This finding may be seen with a diffuse gray matter disturbance that is etiologically nonspecific, but may include a dementia, among other possibilities.  No epileptiform activity is noted.     Alexis Goodell, MD Neurology [phone] 05/01/2019, 8:52 AM

## 2019-05-02 ENCOUNTER — Other Ambulatory Visit: Payer: Self-pay | Admitting: Neurosurgery

## 2019-05-02 DIAGNOSIS — I609 Nontraumatic subarachnoid hemorrhage, unspecified: Secondary | ICD-10-CM

## 2019-05-02 LAB — HEMOGLOBIN A1C
Hgb A1c MFr Bld: 7 % — ABNORMAL HIGH (ref 4.8–5.6)
Mean Plasma Glucose: 154 mg/dL

## 2019-05-27 ENCOUNTER — Ambulatory Visit
Admission: RE | Admit: 2019-05-27 | Discharge: 2019-05-27 | Disposition: A | Discharge status: Home / Self Care | Payer: Medicare Other | Source: Ambulatory Visit | Attending: Neurosurgery | Admitting: Neurosurgery

## 2019-05-27 ENCOUNTER — Other Ambulatory Visit: Payer: Self-pay

## 2019-05-27 DIAGNOSIS — I609 Nontraumatic subarachnoid hemorrhage, unspecified: Secondary | ICD-10-CM | POA: Insufficient documentation

## 2019-08-11 ENCOUNTER — Emergency Department: Payer: Medicare Other

## 2019-08-11 ENCOUNTER — Observation Stay: Payer: Medicare Other

## 2019-08-11 ENCOUNTER — Encounter: Payer: Self-pay | Admitting: Emergency Medicine

## 2019-08-11 ENCOUNTER — Other Ambulatory Visit: Payer: Self-pay

## 2019-08-11 ENCOUNTER — Observation Stay
Admission: EM | Admit: 2019-08-11 | Discharge: 2019-08-12 | Disposition: A | Discharge status: Home / Self Care | Payer: Medicare Other | Attending: Internal Medicine | Admitting: Internal Medicine

## 2019-08-11 DIAGNOSIS — E103593 Type 1 diabetes mellitus with proliferative diabetic retinopathy without macular edema, bilateral: Secondary | ICD-10-CM | POA: Diagnosis not present

## 2019-08-11 DIAGNOSIS — H5461 Unqualified visual loss, right eye, normal vision left eye: Secondary | ICD-10-CM | POA: Diagnosis not present

## 2019-08-11 DIAGNOSIS — K5900 Constipation, unspecified: Secondary | ICD-10-CM | POA: Diagnosis not present

## 2019-08-11 DIAGNOSIS — Z79899 Other long term (current) drug therapy: Secondary | ICD-10-CM | POA: Diagnosis not present

## 2019-08-11 DIAGNOSIS — G894 Chronic pain syndrome: Secondary | ICD-10-CM | POA: Diagnosis not present

## 2019-08-11 DIAGNOSIS — R42 Dizziness and giddiness: Secondary | ICD-10-CM | POA: Insufficient documentation

## 2019-08-11 DIAGNOSIS — G43709 Chronic migraine without aura, not intractable, without status migrainosus: Secondary | ICD-10-CM | POA: Diagnosis not present

## 2019-08-11 DIAGNOSIS — L89159 Pressure ulcer of sacral region, unspecified stage: Secondary | ICD-10-CM | POA: Insufficient documentation

## 2019-08-11 DIAGNOSIS — E785 Hyperlipidemia, unspecified: Secondary | ICD-10-CM | POA: Diagnosis not present

## 2019-08-11 DIAGNOSIS — D509 Iron deficiency anemia, unspecified: Secondary | ICD-10-CM | POA: Insufficient documentation

## 2019-08-11 DIAGNOSIS — Y929 Unspecified place or not applicable: Secondary | ICD-10-CM | POA: Insufficient documentation

## 2019-08-11 DIAGNOSIS — E039 Hypothyroidism, unspecified: Secondary | ICD-10-CM | POA: Diagnosis not present

## 2019-08-11 DIAGNOSIS — E7800 Pure hypercholesterolemia, unspecified: Secondary | ICD-10-CM | POA: Insufficient documentation

## 2019-08-11 DIAGNOSIS — X58XXXA Exposure to other specified factors, initial encounter: Secondary | ICD-10-CM | POA: Diagnosis not present

## 2019-08-11 DIAGNOSIS — Z8673 Personal history of transient ischemic attack (TIA), and cerebral infarction without residual deficits: Secondary | ICD-10-CM | POA: Insufficient documentation

## 2019-08-11 DIAGNOSIS — I700 Atherosclerosis of aorta: Secondary | ICD-10-CM | POA: Diagnosis not present

## 2019-08-11 DIAGNOSIS — D638 Anemia in other chronic diseases classified elsewhere: Secondary | ICD-10-CM | POA: Insufficient documentation

## 2019-08-11 DIAGNOSIS — Z8249 Family history of ischemic heart disease and other diseases of the circulatory system: Secondary | ICD-10-CM | POA: Insufficient documentation

## 2019-08-11 DIAGNOSIS — I10 Essential (primary) hypertension: Secondary | ICD-10-CM | POA: Insufficient documentation

## 2019-08-11 DIAGNOSIS — Z888 Allergy status to other drugs, medicaments and biological substances status: Secondary | ICD-10-CM | POA: Insufficient documentation

## 2019-08-11 DIAGNOSIS — S066X9A Traumatic subarachnoid hemorrhage with loss of consciousness of unspecified duration, initial encounter: Secondary | ICD-10-CM | POA: Diagnosis present

## 2019-08-11 DIAGNOSIS — Z96641 Presence of right artificial hip joint: Secondary | ICD-10-CM | POA: Insufficient documentation

## 2019-08-11 DIAGNOSIS — Z794 Long term (current) use of insulin: Secondary | ICD-10-CM | POA: Diagnosis not present

## 2019-08-11 DIAGNOSIS — K3184 Gastroparesis: Secondary | ICD-10-CM | POA: Diagnosis not present

## 2019-08-11 DIAGNOSIS — S066XAA Traumatic subarachnoid hemorrhage with loss of consciousness status unknown, initial encounter: Secondary | ICD-10-CM | POA: Diagnosis present

## 2019-08-11 DIAGNOSIS — R410 Disorientation, unspecified: Secondary | ICD-10-CM | POA: Diagnosis present

## 2019-08-11 DIAGNOSIS — M810 Age-related osteoporosis without current pathological fracture: Secondary | ICD-10-CM | POA: Insufficient documentation

## 2019-08-11 DIAGNOSIS — R4182 Altered mental status, unspecified: Secondary | ICD-10-CM

## 2019-08-11 DIAGNOSIS — L8990 Pressure ulcer of unspecified site, unspecified stage: Secondary | ICD-10-CM | POA: Insufficient documentation

## 2019-08-11 DIAGNOSIS — Z9641 Presence of insulin pump (external) (internal): Secondary | ICD-10-CM | POA: Insufficient documentation

## 2019-08-11 DIAGNOSIS — M4802 Spinal stenosis, cervical region: Secondary | ICD-10-CM | POA: Diagnosis not present

## 2019-08-11 DIAGNOSIS — E103553 Type 1 diabetes mellitus with stable proliferative diabetic retinopathy, bilateral: Secondary | ICD-10-CM

## 2019-08-11 DIAGNOSIS — E1043 Type 1 diabetes mellitus with diabetic autonomic (poly)neuropathy: Secondary | ICD-10-CM | POA: Insufficient documentation

## 2019-08-11 DIAGNOSIS — I639 Cerebral infarction, unspecified: Secondary | ICD-10-CM | POA: Diagnosis present

## 2019-08-11 DIAGNOSIS — G9341 Metabolic encephalopathy: Secondary | ICD-10-CM | POA: Diagnosis present

## 2019-08-11 DIAGNOSIS — Z20828 Contact with and (suspected) exposure to other viral communicable diseases: Secondary | ICD-10-CM | POA: Diagnosis not present

## 2019-08-11 DIAGNOSIS — E1042 Type 1 diabetes mellitus with diabetic polyneuropathy: Secondary | ICD-10-CM | POA: Diagnosis not present

## 2019-08-11 DIAGNOSIS — Z7982 Long term (current) use of aspirin: Secondary | ICD-10-CM | POA: Insufficient documentation

## 2019-08-11 DIAGNOSIS — Z7902 Long term (current) use of antithrombotics/antiplatelets: Secondary | ICD-10-CM | POA: Insufficient documentation

## 2019-08-11 DIAGNOSIS — Z885 Allergy status to narcotic agent status: Secondary | ICD-10-CM | POA: Insufficient documentation

## 2019-08-11 DIAGNOSIS — Z823 Family history of stroke: Secondary | ICD-10-CM | POA: Insufficient documentation

## 2019-08-11 HISTORY — DX: Malignant (primary) neoplasm, unspecified: C80.1

## 2019-08-11 LAB — URINALYSIS, COMPLETE (UACMP) WITH MICROSCOPIC
Bacteria, UA: NONE SEEN
Bilirubin Urine: NEGATIVE
Glucose, UA: >=500 mg/dL — AB
Hgb urine dipstick: NEGATIVE
Ketones, ur: 5 mg/dL — AB
Leukocytes,Ua: NEGATIVE
Nitrite: NEGATIVE
Protein, ur: NEGATIVE mg/dL
Specific Gravity, Urine: 1.017 (ref 1.005–1.030)
Squamous Epithelial / HPF: NONE SEEN (ref 0–5)
pH: 5 (ref 5.0–8.0)

## 2019-08-11 LAB — TROPONIN I (HIGH SENSITIVITY)
Troponin I (High Sensitivity): 3 ng/L (ref <18)
Troponin I (High Sensitivity): 4 ng/L (ref <18)

## 2019-08-11 LAB — CBC
HCT: 33.5 % — ABNORMAL LOW (ref 36.0–46.0)
Hemoglobin: 11.1 g/dL — ABNORMAL LOW (ref 12.0–15.0)
MCH: 30.7 pg (ref 26.0–34.0)
MCHC: 33.1 g/dL (ref 30.0–36.0)
MCV: 92.5 fL (ref 80.0–100.0)
Platelets: 245 10*3/uL (ref 150–400)
RBC: 3.62 MIL/uL — ABNORMAL LOW (ref 3.87–5.11)
RDW: 11.9 % (ref 11.5–15.5)
WBC: 7.1 10*3/uL (ref 4.0–10.5)
nRBC: 0 % (ref 0.0–0.2)

## 2019-08-11 LAB — COMPREHENSIVE METABOLIC PANEL
ALT: 17 U/L (ref 0–44)
AST: 20 U/L (ref 15–41)
Albumin: 3.9 g/dL (ref 3.5–5.0)
Alkaline Phosphatase: 74 U/L (ref 38–126)
Anion gap: 12 (ref 5–15)
BUN: 21 mg/dL (ref 8–23)
CO2: 23 mmol/L (ref 22–32)
Calcium: 9.3 mg/dL (ref 8.9–10.3)
Chloride: 107 mmol/L (ref 98–111)
Creatinine, Ser: 0.55 mg/dL (ref 0.44–1.00)
GFR calc Af Amer: >60 mL/min (ref >60)
GFR calc non Af Amer: >60 mL/min (ref >60)
Glucose, Bld: 172 mg/dL — ABNORMAL HIGH (ref 70–99)
Potassium: 3.7 mmol/L (ref 3.5–5.1)
Sodium: 142 mmol/L (ref 135–145)
Total Bilirubin: 0.7 mg/dL (ref 0.3–1.2)
Total Protein: 7 g/dL (ref 6.5–8.1)

## 2019-08-11 LAB — PROTIME-INR
INR: 1 (ref 0.8–1.2)
Prothrombin Time: 13 seconds (ref 11.4–15.2)

## 2019-08-11 LAB — GLUCOSE, CAPILLARY
Glucose-Capillary: 157 mg/dL — ABNORMAL HIGH (ref 70–99)
Glucose-Capillary: 160 mg/dL — ABNORMAL HIGH (ref 70–99)
Glucose-Capillary: 179 mg/dL — ABNORMAL HIGH (ref 70–99)
Glucose-Capillary: 204 mg/dL — ABNORMAL HIGH (ref 70–99)
Glucose-Capillary: 351 mg/dL — ABNORMAL HIGH (ref 70–99)
Glucose-Capillary: 405 mg/dL — ABNORMAL HIGH (ref 70–99)

## 2019-08-11 LAB — BASIC METABOLIC PANEL
Anion gap: 11 (ref 5–15)
BUN: 26 mg/dL — ABNORMAL HIGH (ref 8–23)
CO2: 23 mmol/L (ref 22–32)
Calcium: 9.2 mg/dL (ref 8.9–10.3)
Chloride: 104 mmol/L (ref 98–111)
Creatinine, Ser: 0.65 mg/dL (ref 0.44–1.00)
GFR calc Af Amer: >60 mL/min (ref >60)
GFR calc non Af Amer: >60 mL/min (ref >60)
Glucose, Bld: 383 mg/dL — ABNORMAL HIGH (ref 70–99)
Potassium: 4.1 mmol/L (ref 3.5–5.1)
Sodium: 138 mmol/L (ref 135–145)

## 2019-08-11 LAB — SARS CORONAVIRUS 2 (TAT 6-24 HRS): SARS Coronavirus 2: NEGATIVE

## 2019-08-11 MED ORDER — INSULIN ASPART 100 UNIT/ML ~~LOC~~ SOLN
2.0000 [IU] | Freq: Three times a day (TID) | SUBCUTANEOUS | Status: DC
  Administered 2019-08-11 – 2019-08-12 (×3): 2 [IU] via SUBCUTANEOUS
  Filled 2019-08-11 (×3): qty 1

## 2019-08-11 MED ORDER — INSULIN ASPART 100 UNIT/ML ~~LOC~~ SOLN: 0.0000 [IU] | Freq: Three times a day (TID) | SUBCUTANEOUS | Status: DC

## 2019-08-11 MED ORDER — INSULIN ASPART 100 UNIT/ML ~~LOC~~ SOLN
0.0000 [IU] | SUBCUTANEOUS | Status: DC
  Administered 2019-08-11: 17:00:00 2 [IU] via SUBCUTANEOUS
  Administered 2019-08-11 – 2019-08-12 (×2): 1 [IU] via SUBCUTANEOUS
  Filled 2019-08-11 (×4): qty 1

## 2019-08-11 MED ORDER — POLYVINYL ALCOHOL 1.4 % OP SOLN
1.0000 [drp] | Freq: Every day | OPHTHALMIC | Status: DC | PRN
  Filled 2019-08-11: qty 15

## 2019-08-11 MED ORDER — ACETAMINOPHEN 325 MG PO TABS: 650.0000 mg | ORAL_TABLET | Freq: Four times a day (QID) | ORAL | Status: DC | PRN

## 2019-08-11 MED ORDER — GABAPENTIN 300 MG PO CAPS: 300.0000 mg | ORAL_CAPSULE | ORAL | Status: DC

## 2019-08-11 MED ORDER — VENLAFAXINE HCL 37.5 MG PO TABS
75.0000 mg | ORAL_TABLET | Freq: Two times a day (BID) | ORAL | Status: DC
  Administered 2019-08-11 – 2019-08-12 (×2): 75 mg via ORAL
  Filled 2019-08-11 (×4): qty 2

## 2019-08-11 MED ORDER — ONDANSETRON HCL 4 MG/2ML IJ SOLN
4.0000 mg | Freq: Three times a day (TID) | INTRAMUSCULAR | Status: DC | PRN
  Administered 2019-08-11: 4 mg via INTRAVENOUS
  Filled 2019-08-11: qty 2

## 2019-08-11 MED ORDER — BRIMONIDINE TARTRATE-TIMOLOL 0.2-0.5 % OP SOLN
1.0000 [drp] | Freq: Three times a day (TID) | OPHTHALMIC | Status: DC
  Filled 2019-08-11: qty 5

## 2019-08-11 MED ORDER — INSULIN ASPART 100 UNIT/ML ~~LOC~~ SOLN
6.0000 [IU] | Freq: Once | SUBCUTANEOUS | Status: AC
  Administered 2019-08-11: 6 [IU] via SUBCUTANEOUS
  Filled 2019-08-11: qty 1

## 2019-08-11 MED ORDER — GABAPENTIN 300 MG PO CAPS
900.0000 mg | ORAL_CAPSULE | Freq: Every day | ORAL | Status: DC
  Administered 2019-08-11: 900 mg via ORAL
  Filled 2019-08-11 (×2): qty 3

## 2019-08-11 MED ORDER — INSULIN ASPART 100 UNIT/ML ~~LOC~~ SOLN: 0.0000 [IU] | Freq: Every day | SUBCUTANEOUS | Status: DC

## 2019-08-11 MED ORDER — ATORVASTATIN CALCIUM 20 MG PO TABS
40.0000 mg | ORAL_TABLET | Freq: Every day | ORAL | Status: DC
  Administered 2019-08-11: 40 mg via ORAL
  Filled 2019-08-11 (×2): qty 2

## 2019-08-11 MED ORDER — IRBESARTAN 150 MG PO TABS
150.0000 mg | ORAL_TABLET | Freq: Every day | ORAL | Status: DC
  Administered 2019-08-11 – 2019-08-12 (×2): 150 mg via ORAL
  Filled 2019-08-11 (×3): qty 1

## 2019-08-11 MED ORDER — TIMOLOL MALEATE 0.5 % OP SOLN
1.0000 [drp] | Freq: Three times a day (TID) | OPHTHALMIC | Status: DC
  Administered 2019-08-11 – 2019-08-12 (×3): 1 [drp] via OPHTHALMIC
  Filled 2019-08-11: qty 5

## 2019-08-11 MED ORDER — LORATADINE 10 MG PO TABS
10.0000 mg | ORAL_TABLET | Freq: Every day | ORAL | Status: DC | PRN
  Administered 2019-08-12: 10 mg via ORAL
  Filled 2019-08-11: qty 1

## 2019-08-11 MED ORDER — ADULT MULTIVITAMIN W/MINERALS CH
1.0000 | ORAL_TABLET | Freq: Every day | ORAL | Status: DC
  Administered 2019-08-11 – 2019-08-12 (×2): 1 via ORAL
  Filled 2019-08-11 (×3): qty 1

## 2019-08-11 MED ORDER — NETARSUDIL-LATANOPROST 0.02-0.005 % OP SOLN: 1.0000 [drp] | Freq: Every day | OPHTHALMIC | Status: DC

## 2019-08-11 MED ORDER — CALCIUM CARBONATE-VITAMIN D 500-200 MG-UNIT PO TABS
1.0000 | ORAL_TABLET | Freq: Every day | ORAL | Status: DC
  Administered 2019-08-11: 1 via ORAL
  Filled 2019-08-11 (×2): qty 1

## 2019-08-11 MED ORDER — GABAPENTIN 300 MG PO CAPS
300.0000 mg | ORAL_CAPSULE | Freq: Every day | ORAL | Status: DC
  Administered 2019-08-11 – 2019-08-12 (×2): 300 mg via ORAL
  Filled 2019-08-11 (×2): qty 1

## 2019-08-11 MED ORDER — GABAPENTIN 300 MG PO CAPS
300.0000 mg | ORAL_CAPSULE | Freq: Every day | ORAL | Status: DC
  Administered 2019-08-12: 300 mg via ORAL
  Filled 2019-08-11: qty 1

## 2019-08-11 MED ORDER — POLYETHYLENE GLYCOL 3350 17 G PO PACK
17.0000 g | PACK | Freq: Every day | ORAL | Status: DC | PRN
  Filled 2019-08-11: qty 1

## 2019-08-11 MED ORDER — BRIMONIDINE TARTRATE 0.2 % OP SOLN
1.0000 [drp] | Freq: Three times a day (TID) | OPHTHALMIC | Status: DC
  Administered 2019-08-11 – 2019-08-12 (×3): 1 [drp] via OPHTHALMIC
  Filled 2019-08-11: qty 5

## 2019-08-11 MED ORDER — FERROUS SULFATE 325 (65 FE) MG PO TABS
325.0000 mg | ORAL_TABLET | Freq: Every day | ORAL | Status: DC
  Administered 2019-08-11: 325 mg via ORAL
  Filled 2019-08-11 (×2): qty 1

## 2019-08-11 MED ORDER — SODIUM CHLORIDE 0.9 % IV SOLN: INTRAVENOUS | Status: DC

## 2019-08-11 MED ORDER — VITAMIN D3 25 MCG (1000 UNIT) PO TABS
5000.0000 [IU] | ORAL_TABLET | Freq: Every day | ORAL | Status: DC
  Administered 2019-08-11 – 2019-08-12 (×2): 5000 [IU] via ORAL
  Filled 2019-08-11 (×4): qty 5

## 2019-08-11 MED ORDER — HYDRALAZINE HCL 50 MG PO TABS: 25.0000 mg | ORAL_TABLET | Freq: Three times a day (TID) | ORAL | Status: DC | PRN

## 2019-08-11 MED ORDER — INSULIN GLARGINE 100 UNIT/ML ~~LOC~~ SOLN
6.0000 [IU] | Freq: Every day | SUBCUTANEOUS | Status: DC
  Administered 2019-08-11: 17:00:00 6 [IU] via SUBCUTANEOUS
  Filled 2019-08-11 (×2): qty 0.06

## 2019-08-11 MED ORDER — ASCORBIC ACID 500 MG PO TABS
500.0000 mg | ORAL_TABLET | Freq: Two times a day (BID) | ORAL | Status: DC
  Administered 2019-08-11 – 2019-08-12 (×2): 500 mg via ORAL
  Filled 2019-08-11 (×2): qty 1

## 2019-08-11 MED ORDER — ACETAMINOPHEN 650 MG RE SUPP: 650.0000 mg | Freq: Four times a day (QID) | RECTAL | Status: DC | PRN

## 2019-08-11 MED ORDER — ASPIRIN EC 81 MG PO TBEC
81.0000 mg | DELAYED_RELEASE_TABLET | Freq: Every day | ORAL | Status: DC
  Administered 2019-08-12: 08:00:00 81 mg via ORAL
  Filled 2019-08-11: qty 1

## 2019-08-11 NOTE — ED Notes (Signed)
Pt dry heaving at this time.

## 2019-08-11 NOTE — ED Triage Notes (Signed)
Pt in with co confusion since yesterday. Husband states worse this am, does not know her name and was not able to do her normal activities. Pt has hx of multiple strokes.

## 2019-08-11 NOTE — ED Provider Notes (Signed)
[facility] Emergency Department Provider Note   ____________________________________________    I have reviewed the triage vital signs and the nursing notes.   HISTORY  Chief Complaint Altered Mental Status     HPI Erin Daniels is a 73 y.o. female with extensive past medical history as noted below, history of diabetes, CVA, subarachnoid hemorrhage status post fall, recent hip injury who presents today with confusion/altered mental status.  History as per husband.  He reports yesterday evening patient was talking to her daughter and seemed slightly confused, this morning she did not know where she was and required total assistance getting dressed because of her confusion.  He reports that her baseline she is able to take care of herself and "sharp ".  No reports of cough or fevers, has had very strong smelling urine recently.  No falls.  Past Medical History:  Diagnosis Date  . At high risk for falls   . Cancer ([place])   . Complication of anesthesia    difficult to arouse  . Diabetes mellitus without complication (HCC)    Type I  . Diabetic neuropathy ([place])   . Diabetic retinopathy ([place])   . Dizziness   . Gastroparesis   . Herniated disc, cervical   . Hypercholesteremia   . Hypertension   . Hypotensive episode   . Hypothyroidism   . Insulin pump in place   . Myofascial muscle pain   . Numbness of arm    left  . Osteoporosis   . Poor vision   . Seizures ([place])   . Slurred speech   . Stroke (cerebrum) ([place])    4/19  . Stroke (HCC)    x5  . Vertigo   . Wears glasses     Patient Active Problem List   Diagnosis Date Noted  . Acute metabolic encephalopathy [id number]  . HLD (hyperlipidemia) 08/11/2019  . Stroke ([place]) 08/11/2019  . Iron deficiency anemia 08/11/2019  . Subarachnoid hematoma ([place]) 04/30/2019  . Intertrochanteric fracture of right hip ([place]) 12/08/2018  . Hypercoagulopathy ([place]) 09/29/2018  . Acute anxiety 09/29/2018    . Hypertension associated with type 1 diabetes mellitus ([place]) 09/19/2018  . Dyslipidemia due to type 1 diabetes mellitus ([place]) 09/19/2018  . Anemia of chronic disease 09/19/2018  . Chronic migraine without aura 09/19/2018  . Chronic non-seasonal allergic rhinitis 09/19/2018  . Major depression, recurrent, chronic ([place]) 09/19/2018  . Diabetic gastroparesis associated with type 1 diabetes mellitus ([place]) 09/19/2018  . Bilateral sacral insufficiency fracture 09/19/2018  . Constipation 09/15/2018  . Acute urinary retention 09/15/2018  . Intractable pain 09/15/2018  . Chronic cerebrovascular accident (CVA) 05/28/2018  . Diabetic polyneuropathy associated with type 1 diabetes mellitus ([place]) 12/10/2017  . Numbness and tingling of both legs 12/10/2017  . Chronic pain syndrome 12/10/2017  . DKA (diabetic ketoacidoses) ([place]) 11/12/2017  . Confusion 11/11/2017  . Hypoglycemia 11/26/2016  . Lactic acid acidosis   . Sepsis ([place])   . 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 Essential hypertension   . Depression   . Right radial fracture 05/29/2015    Past Surgical History:  Procedure Laterality Date  . APPENDECTOMY    . AUGMENTATION MAMMAPLASTY Bilateral 2011  . BILATERAL CARPAL TUNNEL RELEASE    . COLONOSCOPY    . EYE SURGERY Bilateral    cataract  . HEMORROIDECTOMY    . HERNIA REPAIR     umbilical  . INTRAMEDULLARY (IM) NAIL INTERTROCHANTERIC  Right 12/07/2018   Procedure: INTRAMEDULLARY (IM) NAIL INTERTROCHANTRIC;  Surgeon: Thornton Park, MD;  Location: [facility] ORS;  Service: Orthopedics;  Laterality: Right;  . JOINT REPLACEMENT     right hip  . OPEN REDUCTION INTERNAL FIXATION (ORIF) DISTAL RADIAL FRACTURE Right 05/29/2015   Procedure: OPEN REDUCTION INTERNAL FIXATION (ORIF) RIGHT DISTAL RADIUS FRACTURE WITH ALLOGRAFT BONE GRAFTING FOR REPAIR AND RECONSTRUCTION;  Surgeon: Roseanne Kaufman, MD;  Location: [place];  Service: Orthopedics;  Laterality:  Right;  . OPEN REDUCTION INTERNAL FIXATION (ORIF) METACARPAL Right 01/09/2019   Procedure: OPEN REDUCTION INTERNAL FIXATION (ORIF) RIGHT SECOND METACARPAL FRACTURE;  Surgeon: Corky Mull, MD;  Location: [facility] ORS;  Service: Orthopedics;  Laterality: Right;  . SHOULDER ARTHROSCOPY Left   . SHOULDER HEMI-ARTHROPLASTY Left   . SYNOVECTOMY    . TONSILLECTOMY    . TRIGGER FINGER RELEASE    . TUBAL LIGATION      Prior to Admission medications   Medication Sig Start Date End Date Taking? Authorizing Provider  aspirin EC 81 MG tablet Take 81 mg by mouth daily.   Yes [provider]  atorvastatin (LIPITOR) 40 MG tablet Take 1 tablet (40 mg total) by mouth daily. Patient taking differently: Take 40 mg by mouth daily at 6 PM.  10/10/18  Yes Medina-Vargas, Monina C, NP  brimonidine-timolol (COMBIGAN) 0.2-0.5 % ophthalmic solution Place 1 drop into the right eye 3 (three) times daily. Instill one drop in right eye morning, noon and bedtime    Yes [provider]  Calcium Carb-Cholecalciferol (CALCIUM 600 + D) 600-200 MG-UNIT TABS Take 1 tablet by mouth daily with supper.    Yes [provider]  cetirizine (ZYRTEC) 10 MG tablet Take 1 tablet (10 mg total) by mouth daily. 10/10/18  Yes Medina-Vargas, Monina C, NP  Cholecalciferol (VITAMIN D-3) 5000 units TABS Take 5,000 Units by mouth daily.   Yes [provider]  clopidogrel (PLAVIX) 75 MG tablet Take 75 mg by mouth daily with supper.   Yes [provider]  ferrous sulfate 325 (65 FE) MG tablet Take 325 mg by mouth daily with supper.    Yes [provider]  gabapentin (NEURONTIN) 300 MG capsule 300 mg in the morning, 300 mg in the afternoon, 900 mg in the evening Patient taking differently: Take 300-900 mg by mouth See admin instructions. Take 1 capsule (300mg ) by mouth every morning, 1 capsule (300mg ) by mouth at lunch and take 3 capsules (900mg ) by mouth every night at bedtime 10/10/18  Yes Medina-Vargas,  Monina C, NP  GLUCAGON EMERGENCY 1 MG injection Inject 1 mg into the muscle once. 01/24/19  Yes [provider]  insulin aspart (NOVOLOG) 100 UNIT/ML injection Inject into the skin continuous. (insulin pump averages about 20u over 24 hours)   Yes [provider]  Multiple Vitamins-Minerals (CEROVITE ADVANCED FORMULA PO) Take 1 tablet by mouth daily. 09/18/18  Yes [provider]  Netarsudil-Latanoprost (ROCKLATAN) 0.02-0.005 % SOLN Place 1 drop into the right eye at bedtime.    Yes [provider]  olmesartan (BENICAR) 20 MG tablet Take 20 mg by mouth every evening.  02/28/19  Yes [provider]  polyethylene glycol (MIRALAX / GLYCOLAX) packet Take 17 g by mouth every other day.    Yes [provider]  venlafaxine (EFFEXOR) 75 MG tablet Take 1 tablet (75 mg total) by mouth 2 (two) times daily. 10/10/18  Yes Medina-Vargas, Monina C, NP  vitamin C (ASCORBIC ACID) 500 MG tablet Take 500 mg  by mouth 2 (two) times daily.    Yes [provider]  acetaminophen (TYLENOL) 325 MG tablet Take 2 tablets (650 mg total) by mouth every 6 (six) hours as needed for headache. For mild pain 09/18/18   Saundra Shelling, MD  butalbital-acetaminophen-caffeine (FIORICET) 50-325-40 MG tablet Take 0.5 tablets by mouth 3 (three) times daily as needed for headache.    [provider]  dorzolamide-timolol (COSOPT) 22.3-6.8 MG/ML ophthalmic solution Place 1 drop into both eyes daily. 04/14/19   [provider]  glucose 4 GM chewable tablet Chew 1 tablet (4 g total) by mouth as needed for low blood sugar. <65 10/10/18   Medina-Vargas, Monina C, NP  Polyethyl Glycol-Propyl Glycol (SYSTANE ULTRA) 0.4-0.3 % SOLN Place 1-2 drops into both eyes daily as needed (dry eyes).    [provider]  promethazine (PHENERGAN) 25 MG suppository Place 25 mg rectally every 6 (six) hours as needed for nausea or vomiting.    [provider]      Allergies Carbocaine [mepivacaine hcl], Hydrocodone, Tussionex pennkinetic er [hydrocod polst-cpm polst er], Codeine, Meperidine, Metoclopramide hcl, and Stadol [butorphanol]  Family History  Problem Relation Age of Onset  . Hypertension Mother   . Stroke Mother   . Heart failure Father     Social History Social History   Tobacco Use  . Smoking status: Never Smoker  . Smokeless tobacco: Never Used  Substance Use Topics  . Alcohol use: No  . Drug use: No    Review of Systems limited per altered mental status, per husband  Constitutional: No reports of fevers  ENT: No congestion Cardiovascular: Denies chest pain. Respiratory: Denies shortness of breath. Gastrointestinal: No vomiting or diarrhea Genitourinary: As above Musculoskeletal: Negative for joint swelling Skin: Negative for injury Neurological: Negative for new weakness   ____________________________________________   PHYSICAL EXAM:  VITAL SIGNS: ED Triage Vitals [08/11/19 0646]  Enc Vitals Group     BP (!) 165/72     Pulse Rate 73     Resp 20     Temp 97.7 F (36.5 C)     Temp Source Oral     SpO2 99 %     Weight 47.2 kg (104 lb)     Height      Head Circumference      Peak Flow      Pain Score 0     Pain Loc      Pain Edu?      Excl. in [place]?     Constitutional: Alert, knows her name, knows that she is in the hospital however does not know how she got here Eyes: Conjunctivae are normal.  Head: Atraumatic. Nose: No congestion/rhinnorhea. Mouth/Throat: Mucous membranes are moist.   Neck:  Painless ROM Cardiovascular: Normal rate, regular rhythm.   Good peripheral circulation.  No chest wall tenderness palpation Respiratory: Normal respiratory effort.  No retractions. Lungs CTAB. Gastrointestinal: Soft and nontender. No distention.   Genitourinary: deferred Musculoskeletal: Normal strength in all extremities.  No evidence of injuries.  Warm and well perfused Neurologic:  Normal speech and  language. No gross focal neurologic deficits are appreciated.  Cranial nerves II to XII are normal, normal strength in all extremities Skin:  Skin is warm, dry and intact. No rash noted. Psychiatric: Mood and affect are normal. Speech and behavior are normal.  ____________________________________________   LABS (all labs ordered are listed, but only abnormal results are displayed)  Labs Reviewed  GLUCOSE, CAPILLARY - Abnormal; Notable for the  following components:      Result Value   Glucose-Capillary 157 (*)    All other components within normal limits  CBC - Abnormal; Notable for the following components:   RBC 3.62 (*)    Hemoglobin 11.1 (*)    HCT 33.5 (*)    All other components within normal limits  COMPREHENSIVE METABOLIC PANEL - Abnormal; Notable for the following components:   Glucose, Bld 172 (*)    All other components within normal limits  URINALYSIS, COMPLETE (UACMP) WITH MICROSCOPIC - Abnormal; Notable for the following components:   Color, Urine YELLOW (*)    APPearance CLEAR (*)    Glucose, UA >=500 (*)    Ketones, ur 5 (*)    All other components within normal limits  URINE CULTURE  SARS CORONAVIRUS 2 (TAT 6-24 HRS)  PROTIME-INR  TROPONIN I (HIGH SENSITIVITY)  TROPONIN I (HIGH SENSITIVITY)   ____________________________________________  EKG  ED ECG REPORT I, Lavonia Drafts, the attending physician, personally viewed and interpreted this ECG.  Date: 08/11/2019  Rhythm: normal sinus rhythm QRS Axis: normal Intervals: normal ST/T Wave abnormalities: normal Narrative Interpretation: no evidence of acute ischemia  ____________________________________________  RADIOLOGY  Chest x-ray, CT head unremarkable ____________________________________________   PROCEDURES  Procedure(s) performed: No  Procedures   Critical Care performed: No ____________________________________________   INITIAL IMPRESSION / ASSESSMENT AND PLAN / ED  COURSE  Pertinent labs & imaging results that were available during my care of the patient were reviewed by me and considered in my medical decision making (see chart for details).  Patient presents with significant confusion, suspect metabolic encephalopathy given reassuring neurological exam otherwise.  Husband reports strong smelling urine, pending urinalysis, labs, imaging  Lab work is overall unremarkable, urinalysis without evidence of infection CT head, chest x-ray normal.  Unclear cause of altered mental status, will require admission for further evaluation, MRI possible neuro evaluation    ____________________________________________   FINAL CLINICAL IMPRESSION(S) / ED DIAGNOSES  Final diagnoses:  Altered mental status, unspecified altered mental status type  Confusion        Note:  This document was prepared using Dragon voice recognition software and may include unintentional dictation errors.   Lavonia Drafts, MD 08/11/19 1255

## 2019-08-11 NOTE — Progress Notes (Addendum)
Inpatient Diabetes Program Recommendations  AACE/ADA: New Consensus Statement on Inpatient Glycemic Control   Target Ranges:  Prepandial:   less than 140 mg/dL      Peak postprandial:   less than 180 mg/dL (1-2 hours)      Critically ill patients:  140 - 180 mg/dL   Results for SHAKEIA, LAMPKIN (MRN [id number]) as of 08/11/2019 13:50  Ref. Range 08/11/2019 06:42 08/11/2019 12:58  Glucose-Capillary Latest Ref Range: 70 - 99 mg/dL 157 (H) 405 (H)  Novolog 6 units @13 :27    Review of Glycemic Control  Diabetes history: DM1 (makes NO insulin; requires basal, correction, and carb coverage insulin) Outpatient Diabetes medications: Omni Pod insulin pump with Novolog Current orders for Inpatient glycemic control: Novolog 0-9 units TID with meals, Novolog 0-5 units QHS  Inpatient Diabetes Program Recommendations:   Labs: Would recommend ordering stat BMET to see if patient is in DKA.   Insulin: If patient is in DKA per labs, please order IV insulin drip per DKA order set. If patient is not in DKA, recommend ordering Lantus 6 units Q24H, CBGs AC&HS, Novolog 0-6 units TID with meals, Novolog 0-5 units QHS, and Novolog 2 units TID with meals for meal coverage if patient eats at lest 50% of meals.   NOTE:  Per chart, patient's husband removed insulin pump earlier this morning prior to patient going to Radiology department at 7:08 am. Initial glucose 157 mg/dl at 6:42 am and current glucose 405 mg/dl at 12:58. Patient received Novolog 6 units at 13:27 today. Per RN notes at 11:16 today, patient dry heaving. Question if patient is in DKA if insulin pump has been removed since 7:08 this morning. Patient has DM1 and is followed by Dr. Gabriel Carina (Endocrinologist) for DM management. Patient is very sensitive to insulin and does not require much insulin. However, if patient goes without insulin she is very likely to go into DKA.  Patient seen Dr. Gabriel Carina on 05/30/19 and per office note the following should be  patient's insulin pump settings:  Basal 12 am 0.3 units/hr 4 am 0.35 units/hr 9:30 am 0.25 units/hr 12 pm 0.4 units/hr 3:30 pm 0.3 units/hr 6:30 pm 0.25 units/hr 24-hr basal = 7.425 units  Bolus I:C ratio 12 AM 25 (1 unit covers 25 grams), 7 am 10 (1 unit covers 10 grams), 10 am 15 (1 unit covers 15 grams) Sensitivity 80 (1 unit drops glucose 80 mg/dl) Target 120 - 150 mg/dl Active insulin time 4 hrs  Sent chat message to Jarvis Morgan, RN to ask if patient still has insulin pump off. RN reports patient has moved to different area in the Emergency Room. Spoke with Gertie Exon, RN regarding patient and concern for DKA. Patient does NOT have insulin pump on and her husband did not bring it back to the hospital. Patient's husband helps her manage her insulin pump outpatient. Since patient's husband does not stay with her constantly at the hospital, would recommend using IV and/or SQ insulin while inpatient and have patient resume her insulin pump once discharged. Recommend ordering stat BMET to check for DKA. If patient is in DKA, she will need IV insulin drip. If patient is not in DKA, she will need low dose basal insulin, Novolog correction, and Novolog meal coverage (as noted above).  Addendum 08/11/19@16 :26-Amanda, RN sent chat message regarding patient's husband insisting for patient to resume insulin pump. Due to patient confusion, would not recommend resuming insulin pump as patient's husband is not with her in the hospital  around the clock.  Dr. Blaine Hamper agrees that insulin pump is not safe at this time. Spoke with patient's husband over the phone regarding glycemic control and concerns with his wife using an insulin pump while inpatient. Patient's husband expressed frustration with not being able to resume his wife's insulin pump and he is upset about how glucose went up to 405 mg/dl today. Explained that since her insulin pump was removed around 7:10 am today (her Dexcom CGM was also removed at  that time) and she was not getting any insulin via pump or SQ her glucose went up. Labs were checked and not indicative of DKA so SQ insulin has been ordered. Explained that I am able to see Dr. Joycie Peek note from 05/30/19 and the SQ insulin recommendations are based on insulin pump settings. Discussed current SQ insulin orders (Lantus 6 units daily, Novolog 0-9 units TID with meals, Novolog 0-5 units QHS, and Novolog 2 units TID with meals for meal coverage). Patient's husband states he feels better knowing that we are using information from Dr. Joycie Peek notes for SQ insulin recommendations and that patient is receiving basal, meal coverage, and correction insulin. Patient's husband states he would feel better if CBGs were checked more frequently and that nursing be sure patient eats at least 50% of meal before giving Novolog 2 units for meal coverage. Informed patient's husband that it would be requested to change CBGs and Novolog to very sensitive scale and change to Q4H. Informed patient's husband that our team will be following patient while inpatient and that we will review her glucose trends daily and make recommendations if needed. Patient's husband verbalized understanding of information discussed and he expressed that he feels better after talking with me and being updated on current insulin orders. Sent secure chat to MD and RN to ask if CBGs and Novolog could be changed to 0-6 units Q4H and asked RN to be sure patient eats at least 50% of meals before giving meal coverage insulin.  Thanks, Barnie Alderman, RN, MSN, CDE Diabetes Coordinator Inpatient Diabetes Program [phone] (Team Pager from 8am to 5pm)

## 2019-08-11 NOTE — ED Notes (Signed)
Patient transported to X-ray 

## 2019-08-11 NOTE — H&P (Signed)
History and Physical    Erin Daniels [id number] DOB: 07/30/1946 DOA: 08/11/2019  Referring MD/NP/PA:   PCP: Juluis Pitch, MD   Patient coming from:  The patient is coming from home.  At baseline, pt is independent for most of ADL.        Chief Complaint: AMS  HPI: Erin Daniels is a 73 y.o. female with medical history significant of type 1 diabetes on insulin pump, hypertension, hyperlipidemia, stroke, hypothyroidism, seizure, gastroparesis, iron deficiency anemia, SAH, who presents with altered mental status.  Per her husband, pt became confused since yesterday evening when fever was talking to her daughter. She has been persistently confused and seems to be worse this morning. She does not know her name and was not able to do her normal activities. He reports that her baseline she is able to take care of herself and "sharp ". No falls. When I saw pt in ED, her mental status has improved, she is still mildly confused, but is orientated x3.  She moves all extremities.  No facial droop or slurred speech.  Patient denies any chest pain, shortness of breath, cough.  No nausea, vomiting, diarrhea, abdominal pain, symptoms of UTI.  No seizure activity noted.  ED Course: pt was found to have WBC 7.1, INR 1.0, troponin IV, pending COVID-19 test, electrolytes renal function okay, temperature normal, blood pressure 172/65, heart rate 89, oxygen saturation 99% on room air.  Chest x-ray negative.  CT head is negative for acute intracranial abnormalities.  MRI is negative for stroke, showed improvement of previously noted subarachnoid hemorrhage in the right sylvian fissure. Pt is placed on MedSurg bed for observation.  Neurology,  Dr. Irish Elders was consulted.  Review of Systems:   General: no fevers, chills, no body weight gain, has fatigue HEENT: no blurry vision, hearing changes or sore throat Respiratory: no dyspnea, coughing, wheezing CV: no chest pain, no palpitations GI: no  nausea, vomiting, abdominal pain, diarrhea, constipation GU: no dysuria, burning on urination, increased urinary frequency, hematuria  Ext: no leg edema Neuro: no unilateral weakness, numbness, or tingling, no vision change or hearing loss. Has confusion. Skin: no rash, no skin tear. MSK: No muscle spasm, no deformity, no limitation of range of movement in spin Heme: No easy bruising.  Travel history: No recent long distant travel.  Allergy:  Allergies  Allergen Reactions  . Carbocaine [Mepivacaine Hcl] Palpitations    Low bp  . Hydrocodone Other (See Comments)    Diabetic/  High sugars  . Tussionex Pennkinetic Er [Hydrocod Polst-Cpm Polst Er] Other (See Comments)    Diabetic/  High sugars  . Codeine Nausea Only and Other (See Comments)    Reaction: stress attacks/ headache and nausea Can take Vicodin and Percocet   . Meperidine Nausea Only and Other (See Comments)    Reaction: stress attacks/ headache and nausea  . Metoclopramide Hcl Nausea Only and Other (See Comments)    Reaction: stress attacks/ headache   . Stadol [Butorphanol] Nausea Only and Other (See Comments)    Reaction: stress attacks/ headache    Past Medical History:  Diagnosis Date  . At high risk for falls   . Cancer ([place])   . Complication of anesthesia    difficult to arouse  . Diabetes mellitus without complication (HCC)    Type I  . Diabetic neuropathy ([place])   . Diabetic retinopathy ([place])   . Dizziness   . Gastroparesis   . Herniated disc, cervical   . Hypercholesteremia   .  Hypertension   . Hypotensive episode   . Hypothyroidism   . Insulin pump in place   . Myofascial muscle pain   . Numbness of arm    left  . Osteoporosis   . Poor vision   . Seizures ([place])   . Slurred speech   . Stroke (cerebrum) ([place])    4/19  . Stroke (HCC)    x5  . Vertigo   . Wears glasses     Past Surgical History:  Procedure Laterality Date  . APPENDECTOMY    . AUGMENTATION MAMMAPLASTY Bilateral 2011  .  BILATERAL CARPAL TUNNEL RELEASE    . COLONOSCOPY    . EYE SURGERY Bilateral    cataract  . HEMORROIDECTOMY    . HERNIA REPAIR     umbilical  . INTRAMEDULLARY (IM) NAIL INTERTROCHANTERIC Right 12/07/2018   Procedure: INTRAMEDULLARY (IM) NAIL INTERTROCHANTRIC;  Surgeon: Thornton Park, MD;  Location: [facility];  Service: Orthopedics;  Laterality: Right;  . JOINT REPLACEMENT     right hip  . OPEN REDUCTION INTERNAL FIXATION (ORIF) DISTAL RADIAL FRACTURE Right 05/29/2015   Procedure: OPEN REDUCTION INTERNAL FIXATION (ORIF) RIGHT DISTAL RADIUS FRACTURE WITH ALLOGRAFT BONE GRAFTING FOR REPAIR AND RECONSTRUCTION;  Surgeon: Roseanne Kaufman, MD;  Location: [place];  Service: Orthopedics;  Laterality: Right;  . OPEN REDUCTION INTERNAL FIXATION (ORIF) METACARPAL Right 01/09/2019   Procedure: OPEN REDUCTION INTERNAL FIXATION (ORIF) RIGHT SECOND METACARPAL FRACTURE;  Surgeon: Corky Mull, MD;  Location: [facility];  Service: Orthopedics;  Laterality: Right;  . SHOULDER ARTHROSCOPY Left   . SHOULDER HEMI-ARTHROPLASTY Left   . SYNOVECTOMY    . TONSILLECTOMY    . TRIGGER FINGER RELEASE    . TUBAL LIGATION      Social History:  reports that she has never smoked. She has never used smokeless tobacco. She reports that she does not drink alcohol or use drugs.  Family History:  Family History  Problem Relation Age of Onset  . Hypertension Mother   . Stroke Mother   . Heart failure Father      Prior to Admission medications   Medication Sig Start Date End Date Taking? Authorizing Provider  acetaminophen (TYLENOL) 325 MG tablet Take 2 tablets (650 mg total) by mouth every 6 (six) hours as needed for headache. For mild pain 09/18/18   Saundra Shelling, MD  atorvastatin (LIPITOR) 40 MG tablet Take 1 tablet (40 mg total) by mouth daily. Patient taking differently: Take 40 mg by mouth daily at 6 PM.  10/10/18   Medina-Vargas, Monina C, NP  brimonidine-timolol (COMBIGAN) 0.2-0.5 % ophthalmic solution Place 1 drop  into both eyes every 12 (twelve) hours. Instill one drop in right eye morning, noon and bedtime     [provider]  Calcium Carb-Cholecalciferol (CALCIUM 600 + D) 600-200 MG-UNIT TABS Take 1 tablet by mouth every evening.    [provider]  carboxymethylcellulose (REFRESH PLUS) 0.5 % SOLN Place 1 drop into both eyes as needed (for dry eyes). 10/10/18   Medina-Vargas, Monina C, NP  cetirizine (ZYRTEC) 10 MG tablet Take 1 tablet (10 mg total) by mouth daily. 10/10/18   Medina-Vargas, Monina C, NP  Cholecalciferol (VITAMIN D-3) 5000 units TABS Take 5,000 Units by mouth daily.    [provider]  dorzolamide-timolol (COSOPT) 22.3-6.8 MG/ML ophthalmic solution Place 1 drop into both eyes daily. 04/14/19   [provider]  ferrous sulfate 325 (65 FE) MG tablet Take 325 mg by mouth daily with breakfast.  [provider]  gabapentin (NEURONTIN) 300 MG capsule 300 mg in the morning, 300 mg in the afternoon, 900 mg in the evening Patient taking differently: Take 300-900 mg by mouth 3 (three) times daily. 300 mg in the morning, 300 mg in the afternoon, 900 mg in the evening 10/10/18   Medina-Vargas, Monina C, NP  GLUCAGON EMERGENCY 1 MG injection Inject 1 mg into the muscle once. 01/24/19   [provider]  glucose 4 GM chewable tablet Chew 1 tablet (4 g total) by mouth as needed for low blood sugar. <65 10/10/18   Medina-Vargas, Monina C, NP  insulin aspart (NOVOLOG FLEXPEN) 100 UNIT/ML FlexPen Inject 0-15 Units into the skin 3 (three) times daily with meals. Inject 0-15 units subcutaneous per sliding scale before meals and at bedtime 0 - 100 units = 0 units 101 - 120 = 1 unit 121 - 150 = 2 units 151 - 200 = 4 units 201 - 250 = 6 units 251 - 300 = 8 units 301 - 350 = 10 units 351 - 400 = 12 units 401 - 450 = 15 units > 450 call MD 10/10/18   Medina-Vargas, Monina C, NP  losartan (COZAAR) 50 MG tablet Take 1 tablet (50 mg total) by mouth daily for 30 days.  10/10/18 04/30/19  Medina-Vargas, Monina C, NP  Multiple Vitamins-Minerals (CEROVITE ADVANCED FORMULA PO) Take 1 tablet by mouth daily. 09/18/18   [provider]  Netarsudil-Latanoprost (ROCKLATAN) 0.02-0.005 % SOLN Apply 1 drop to eye at bedtime. Instill one drop in right eye at bedtime    [provider]  NON FORMULARY Diet type:  NCS 09/18/18   [provider]  olmesartan (BENICAR) 20 MG tablet Take 20 mg by mouth daily. 02/28/19   [provider]  polyethylene glycol (MIRALAX / GLYCOLAX) packet Take 17 g by mouth daily as needed.     [provider]  promethazine (PHENERGAN) 25 MG suppository Place 25 mg rectally every 6 (six) hours as needed for nausea or vomiting.    [provider]  senna (SENOKOT) 8.6 MG TABS tablet Take 2 tablets by mouth daily as needed.  09/19/18   [provider]  venlafaxine (EFFEXOR) 75 MG tablet Take 1 tablet (75 mg total) by mouth 2 (two) times daily. 10/10/18   Medina-Vargas, Monina C, NP  vitamin C (ASCORBIC ACID) 500 MG tablet Take 500 mg by mouth daily.     [provider]    Physical Exam: Vitals:   08/11/19 0900 08/11/19 0930 08/11/19 1000 08/11/19 1030  BP: (!) 161/77 (!) 152/68 (!) 155/73 (!) 164/80  Pulse: 89 94 89 89  Resp: 17 20 15 17   Temp:      TempSrc:      SpO2: 100% 98% 98% 98%  Weight:       General: Not in acute distress HEENT:       Eyes: PERRL, EOMI, no scleral icterus.       ENT: No discharge from the ears and nose, no pharynx injection, no tonsillar enlargement.        Neck: No JVD, no bruit, no mass felt. Heme: No neck lymph node enlargement. Cardiac: S1/S2, RRR, No murmurs, No gallops or rubs. Respiratory: No rales, wheezing, rhonchi or rubs. GI: Soft, nondistended, nontender, no rebound pain, no organomegaly, BS present. GU: No hematuria Ext: No pitting leg edema bilaterally. 2+DP/PT pulse bilaterally. Musculoskeletal: No joint deformities, No joint redness or  warmth, no limitation of ROM in spin.  Skin: No rashes.  Neuro: currently pt is mildly confused but is oriented X3, cranial nerves II-XII grossly intact, moves all extremities  Psych: Patient is not psychotic, no suicidal or hemocidal ideation.  Labs on Admission: I have personally reviewed following labs and imaging studies  CBC: Recent Labs  Lab 08/11/19 0651  WBC 7.1  HGB 11.1*  HCT 33.5*  MCV 92.5  PLT [id number]   Basic Metabolic Panel: Recent Labs  Lab 08/11/19 0651  NA 142  K 3.7  CL 107  CO2 23  GLUCOSE 172*  BUN 21  CREATININE 0.55  CALCIUM 9.3   GFR: Estimated Creatinine Clearance: 45.3 mL/min (by C-G formula based on SCr of 0.55 mg/dL). Liver Function Tests: Recent Labs  Lab 08/11/19 0651  AST 20  ALT 17  ALKPHOS 74  BILITOT 0.7  PROT 7.0  ALBUMIN 3.9   No results for input(s): LIPASE, AMYLASE in the last 168 hours. No results for input(s): AMMONIA in the last 168 hours. Coagulation Profile: Recent Labs  Lab 08/11/19 0651  INR 1.0   Cardiac Enzymes: No results for input(s): CKTOTAL, CKMB, CKMBINDEX, TROPONINI in the last 168 hours. BNP (last 3 results) No results for input(s): PROBNP in the last 8760 hours. HbA1C: No results for input(s): HGBA1C in the last 72 hours. CBG: Recent Labs  Lab 08/11/19 0642 08/11/19 1258  GLUCAP 157* 405*   Lipid Profile: No results for input(s): CHOL, HDL, LDLCALC, TRIG, CHOLHDL, LDLDIRECT in the last 72 hours. Thyroid Function Tests: No results for input(s): TSH, T4TOTAL, FREET4, T3FREE, THYROIDAB in the last 72 hours. Anemia Panel: No results for input(s): VITAMINB12, FOLATE, FERRITIN, TIBC, IRON, RETICCTPCT in the last 72 hours. Urine analysis:    Component Value Date/Time   COLORURINE YELLOW (A) 08/11/2019 0856   APPEARANCEUR CLEAR (A) 08/11/2019 0856   APPEARANCEUR Clear 10/19/2012 1628   LABSPEC 1.017 08/11/2019 0856   LABSPEC 1.017 10/19/2012 1628   PHURINE 5.0 08/11/2019 0856   GLUCOSEU >=500 (A)  08/11/2019 0856   GLUCOSEU >=500 10/19/2012 1628   HGBUR NEGATIVE 08/11/2019 0856   BILIRUBINUR NEGATIVE 08/11/2019 0856   BILIRUBINUR Negative 10/19/2012 1628   KETONESUR 5 (A) 08/11/2019 0856   PROTEINUR NEGATIVE 08/11/2019 0856   NITRITE NEGATIVE 08/11/2019 0856   LEUKOCYTESUR NEGATIVE 08/11/2019 0856   LEUKOCYTESUR Negative 10/19/2012 1628   Sepsis Labs: @LABRCNTIP (procalcitonin:4,lacticidven:4) )No results found for this or any previous visit (from the past 240 hour(s)).   Radiological Exams on Admission: DG Chest 2 View  Result Date: 08/11/2019 CLINICAL DATA:  Confusion EXAM: CHEST - 2 VIEW COMPARISON:  December 06, 2018 FINDINGS: There is no edema or consolidation. Heart size and pulmonary vascularity are normal. No adenopathy. 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 There are foci of carotid artery calcification bilaterally. There is aortic atherosclerosis. IMPRESSION: No edema or consolidation. Cardiac silhouette within normal limits. There are foci of carotid artery calcification bilaterally. Aortic Atherosclerosis (ICD10-I70.0). Electronically Signed   By: Lowella Grip III M.D.   On: 08/11/2019 07:23   CT Head Wo Contrast  Result Date: 08/11/2019 CLINICAL DATA:  Confusion.  History of multiple strokes EXAM: CT HEAD WITHOUT CONTRAST TECHNIQUE: Contiguous axial images were obtained from the base of the skull through the vertex without intravenous contrast. COMPARISON:  05/27/2019 FINDINGS: Brain: No evidence of acute infarction, hemorrhage, hydrocephalus, extra-axial collection or mass lesion/mass effect. Remote lacunar infarcts at the genu of the left internal capsule, bilateral pons, and at the bilateral medial thalamus. Small vessel  ischemic gliosis in the deep cerebral white matter. Overall mild volume loss. Vascular: Confluent arterial calcification at the siphons. Skull: Negative Sinuses/Orbits: Bilateral cataract resection IMPRESSION: 1. No acute finding. 2.  Chronic small vessel ischemia with multiple remote lacunar infarcts Electronically Signed   By: Monte Fantasia M.D.   On: 08/11/2019 07:27   MR BRAIN WO CONTRAST  Result Date: 08/11/2019 CLINICAL DATA:  Encephalopathy.  History of stroke EXAM: MRI HEAD WITHOUT CONTRAST TECHNIQUE: Multiplanar, multiecho pulse sequences of the brain and surrounding structures were obtained without intravenous contrast. COMPARISON:  CT head 08/11/2019.  MRI head 04/30/2019 FINDINGS: Brain: Negative for acute infarct. Mild to moderate atrophy. Moderate to advanced chronic microvascular ischemic changes in the white matter bilaterally. Chronic ischemic change in the thalamus and pons bilaterally. Chronic infarct left basal ganglia. Small chronic infarct left cerebellum. Previously noted subarachnoid hemorrhage in the right sylvian fissure has improved in the interval with a small amount of hemosiderin staining remaining. Small areas of chronic microhemorrhage in the right parietal lobe. No new hemorrhage. Negative for mass or midline shift. Vascular: Normal arterial flow voids. Skull and upper cervical spine: No focal skull lesion. Disc degeneration and spondylosis at C3-4 causing spinal stenosis. Sinuses/Orbits: Paranasal sinuses clear.  Bilateral cataract surgery Other: None IMPRESSION: Negative for acute infarct Atrophy and moderate to advanced chronic ischemic change. Improvement in previously noted subarachnoid hemorrhage in the right sylvian fissure. Electronically Signed   By: Franchot Gallo M.D.   On: 08/11/2019 11:16     EKG: Independently reviewed.  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  CT head negative.  MRI is negative for stroke, but showed improvement of previously noted subarachnoid hemorrhage in the right sylvian fissure.  Currently orientated x3.  Neurology, Dr. Irish Elders was consulted. Since her mental status has already improved. Dr. Irish Elders recommended no further imaging test.  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 on insulin pump -switched to lantus 6 unit daily -SSI -2 units of novolog tid with meal  HTN: not taking meds at home -hydralazine prn  Subarachnoid hematoma ([place]): -hold plavix  HLD (hyperlipidemia): -lipitor  Hx of Stroke [facility]): -hold plavix -continue ASA 81 mg daily -lipitor  Iron deficiency anemia: Hgb 11.1 -Continue iron supplement        DVT ppx: SCD Code Status: Full code Family Communication:  Yes, patient's  husband  at bed side Disposition Plan:  Anticipate discharge back to previous home environment Consults called:  Dr. Irish Elders of neuro Admission status: Med-surg bed for obs    Date of Service 08/11/2019    [place]   If 7PM-7AM, please contact night-coverage [email or web address] Password [email or web address] 08/11/2019, 1:35 PM

## 2019-08-11 NOTE — ED Notes (Signed)
Pt husband removed pt insulin pump prior to going to xray and CT.Marland Kitchen

## 2019-08-11 NOTE — ED Notes (Signed)
Pt returned from xray and CT.

## 2019-08-11 NOTE — ED Notes (Signed)
Report given to Stephanie RN, care transferred.

## 2019-08-11 NOTE — Consult Note (Signed)
Reason for Consult:AMS Referring Physician: Dr. Blaine Hamper  CC: AMS  HPI: ELLISHA Daniels is an 73 y.o. female with medical history significant of type 1 diabetes, hypertension, hyperlipidemia, stroke, hypothyroidism, seizure, gastroparesis, iron deficiency anemia, SAH, who presents with altered mental status.  Pt with transient confusion since yeterday evening when fever was talking to her daughterNo facial droop or slurred speech.  Patient denies any chest pain, shortness of breath, cough.  Upon my examination mentation seems to have improved as patient is able to tell me her name and location.   Past Medical History:  Diagnosis Date  . At high risk for falls   . Cancer ([place])   . Complication of anesthesia    difficult to arouse  . Diabetes mellitus without complication (HCC)    Type I  . Diabetic neuropathy ([place])   . Diabetic retinopathy ([place])   . Dizziness   . Gastroparesis   . Herniated disc, cervical   . Hypercholesteremia   . Hypertension   . Hypotensive episode   . Hypothyroidism   . Insulin pump in place   . Myofascial muscle pain   . Numbness of arm    left  . Osteoporosis   . Poor vision   . Seizures ([place])   . Slurred speech   . Stroke (cerebrum) ([place])    4/19  . Stroke (HCC)    x5  . Vertigo   . Wears glasses     Past Surgical History:  Procedure Laterality Date  . APPENDECTOMY    . AUGMENTATION MAMMAPLASTY Bilateral 2011  . BILATERAL CARPAL TUNNEL RELEASE    . COLONOSCOPY    . EYE SURGERY Bilateral    cataract  . HEMORROIDECTOMY    . HERNIA REPAIR     umbilical  . INTRAMEDULLARY (IM) NAIL INTERTROCHANTERIC Right 12/07/2018   Procedure: INTRAMEDULLARY (IM) NAIL INTERTROCHANTRIC;  Surgeon: Thornton Park, MD;  Location: [facility];  Service: Orthopedics;  Laterality: Right;  . JOINT REPLACEMENT     right hip  . OPEN REDUCTION INTERNAL FIXATION (ORIF) DISTAL RADIAL FRACTURE Right 05/29/2015   Procedure: OPEN REDUCTION INTERNAL FIXATION (ORIF) RIGHT DISTAL  RADIUS FRACTURE WITH ALLOGRAFT BONE GRAFTING FOR REPAIR AND RECONSTRUCTION;  Surgeon: Roseanne Kaufman, MD;  Location: [place];  Service: Orthopedics;  Laterality: Right;  . OPEN REDUCTION INTERNAL FIXATION (ORIF) METACARPAL Right 01/09/2019   Procedure: OPEN REDUCTION INTERNAL FIXATION (ORIF) RIGHT SECOND METACARPAL FRACTURE;  Surgeon: Corky Mull, MD;  Location: [facility];  Service: Orthopedics;  Laterality: Right;  . SHOULDER ARTHROSCOPY Left   . SHOULDER HEMI-ARTHROPLASTY Left   . SYNOVECTOMY    . TONSILLECTOMY    . TRIGGER FINGER RELEASE    . TUBAL LIGATION      Family History  Problem Relation Age of Onset  . Hypertension Mother   . Stroke Mother   . Heart failure Father     Social History:  reports that she has never smoked. She has never used smokeless tobacco. She reports that she does not drink alcohol or use drugs.  Allergies  Allergen Reactions  . Carbocaine [Mepivacaine Hcl] Palpitations    Low bp  . Hydrocodone Other (See Comments)    Diabetic/  High sugars  . Tussionex Pennkinetic Er [Hydrocod Polst-Cpm Polst Er] Other (See Comments)    Diabetic/  High sugars  . Codeine Nausea Only and Other (See Comments)    Reaction: stress attacks/ headache and nausea Can take Vicodin and Percocet   . Meperidine Nausea Only and  Other (See Comments)    Reaction: stress attacks/ headache and nausea  . Metoclopramide Hcl Nausea Only and Other (See Comments)    Reaction: stress attacks/ headache   . Stadol [Butorphanol] Nausea Only and Other (See Comments)    Reaction: stress attacks/ headache    Medications: I have reviewed the patient's current medications.  ROS: Unable to obtain  Physical Examination: Blood pressure (!) 168/76, pulse 91, temperature 98.4 F (36.9 C), temperature source Oral, resp. rate 16, weight 47.2 kg, SpO2 98 %.    Neurological Examination   Mental Status: Alert, oriented follows commands.  Cranial Nerves: II: Discs flat bilaterally; Visual  fields grossly normal, pupils equal, round, reactive to light and accommodation III,IV, VI: ptosis not present, extra-ocular motions intact bilaterally V,VII: smile symmetric, facial light touch sensation normal bilaterally VIII: hearing normal bilaterally IX,X: gag reflex present XI: bilateral shoulder shrug XII: midline tongue extension Motor: Generalized weakness  Tone and bulk:normal tone throughout; no atrophy noted Sensory: Pinprick and light touch intact throughout, bilaterally Deep Tendon Reflexes: 1+ and symmetric throughout Plantars: Right: downgoing   Left: downgoing Cerebellar: normal finger-to-nose    Laboratory Studies:   Basic Metabolic Panel: Recent Labs  Lab 08/11/19 0651 08/11/19 1428  NA 142 138  K 3.7 4.1  CL 107 104  CO2 23 23  GLUCOSE 172* 383*  BUN 21 26*  CREATININE 0.55 0.65  CALCIUM 9.3 9.2    Liver Function Tests: Recent Labs  Lab 08/11/19 0651  AST 20  ALT 17  ALKPHOS 74  BILITOT 0.7  PROT 7.0  ALBUMIN 3.9   No results for input(s): LIPASE, AMYLASE in the last 168 hours. No results for input(s): AMMONIA in the last 168 hours.  CBC: Recent Labs  Lab 08/11/19 0651  WBC 7.1  HGB 11.1*  HCT 33.5*  MCV 92.5  PLT 245    Cardiac Enzymes: No results for input(s): CKTOTAL, CKMB, CKMBINDEX, TROPONINI in the last 168 hours.  BNP: Invalid input(s): POCBNP  CBG: Recent Labs  Lab 08/11/19 0642 08/11/19 1258 08/11/19 1426  GLUCAP 157* 405* 351*    Microbiology: Results for orders placed or performed during the hospital encounter of 04/30/19  Urine culture     Status: None   Collection Time: 04/30/19  9:01 AM   Specimen: Urine, Random  Result Value Ref Range Status   Specimen Description   Final    URINE, RANDOM Performed at [facility], [address], [place], [place] [address]    Special Requests   Final    NONE Performed at [facility], [address] [place], [place] [address]    Culture    Final    NO GROWTH Performed at [facility], [place] [address] [place],  [address]    Report Status 05/01/2019 FINAL  Final  SARS Coronavirus 2 [facility] order, Performed in [facility] hospital lab) Nasopharyngeal     Status: None   Collection Time: 04/30/19  9:42 AM   Specimen: Nasopharyngeal  Result Value Ref Range Status   SARS Coronavirus 2 NEGATIVE NEGATIVE Final    Comment: (NOTE) If result is NEGATIVE SARS-CoV-2 target nucleic acids are NOT DETECTED. The SARS-CoV-2 RNA is generally detectable in upper and lower  respiratory specimens during the acute phase of infection. The lowest  concentration of SARS-CoV-2 viral copies this assay can detect is 250  copies / mL. A negative result does not preclude SARS-CoV-2 infection  and should not be used as the  sole basis for treatment or other  patient management decisions.  A negative result may occur with  improper specimen collection / handling, submission of specimen other  than nasopharyngeal swab, presence of viral mutation(s) within the  areas targeted by this assay, and inadequate number of viral copies  (<250 copies / mL). A negative result must be combined with clinical  observations, patient history, and epidemiological information. If result is POSITIVE SARS-CoV-2 target nucleic acids are DETECTED. The SARS-CoV-2 RNA is generally detectable in upper and lower  respiratory specimens dur ing the acute phase of infection.  Positive  results are indicative of active infection with SARS-CoV-2.  Clinical  correlation with patient history and other diagnostic information is  necessary to determine patient infection status.  Positive results do  not rule out bacterial infection or co-infection with other viruses. If result is PRESUMPTIVE POSTIVE SARS-CoV-2 nucleic acids MAY BE PRESENT.   A presumptive positive result was obtained on the submitted specimen  and confirmed on repeat testing.  While 2019 novel coronavirus   (SARS-CoV-2) nucleic acids may be present in the submitted sample  additional confirmatory testing may be necessary for epidemiological  and / or clinical management purposes  to differentiate between  SARS-CoV-2 and other Sarbecovirus currently known to infect humans.  If clinically indicated additional testing with an alternate test  methodology [phone]) is advised. The SARS-CoV-2 RNA is generally  detectable in upper and lower respiratory sp ecimens during the acute  phase of infection. The expected result is Negative. Fact Sheet for Patients:  StrictlyIdeas.no Fact Sheet for Healthcare Providers: BankingDealers.co.za This test is not yet approved or cleared by the [place] FDA and has been authorized for detection and/or diagnosis of SARS-CoV-2 by FDA under an Emergency Use Authorization (EUA).  This EUA will remain in effect (meaning this test can be used) for the duration of the COVID-19 declaration under Section 564(b)(1) of the Act, 21 U.S.C. section 360bbb-3(b)(1), unless the authorization is terminated or revoked sooner. Performed at [facility], [address] [place], [place] [address]     Coagulation Studies: Recent Labs    08/11/19 0651  LABPROT 13.0  INR 1.0    Urinalysis:  Recent Labs  Lab 08/11/19 0856  COLORURINE YELLOW*  LABSPEC 1.017  PHURINE 5.0  GLUCOSEU >=500*  HGBUR NEGATIVE  BILIRUBINUR NEGATIVE  KETONESUR 5*  PROTEINUR NEGATIVE  NITRITE NEGATIVE  LEUKOCYTESUR NEGATIVE    Lipid Panel:     Component Value Date/Time   CHOL 148 05/29/2018 0556   TRIG 88 05/29/2018 0556   HDL 84 05/29/2018 0556   CHOLHDL 1.8 05/29/2018 0556   VLDL 18 05/29/2018 0556   LDLCALC 46 05/29/2018 0556    HgbA1C:  Lab Results  Component Value Date   HGBA1C 7.0 (H) 05/01/2019    Urine Drug Screen:      Component Value Date/Time   LABOPIA NONE DETECTED 12/06/2018 2242   COCAINSCRNUR NONE  DETECTED 12/06/2018 2242   LABBENZ POSITIVE (A) 12/06/2018 2242   AMPHETMU NONE DETECTED 12/06/2018 2242   THCU NONE DETECTED 12/06/2018 2242   LABBARB POSITIVE (A) 12/06/2018 2242    Alcohol Level: No results for input(s): ETH in the last 168 hours.  Other results: EKG: normal EKG, normal sinus rhythm, unchanged from previous tracings.  Imaging: DG Chest 2 View  Result Date: 08/11/2019 CLINICAL DATA:  Confusion EXAM: CHEST - 2 VIEW COMPARISON:  December 06, 2018 FINDINGS: There is no edema or consolidation. Heart size and pulmonary vascularity  are normal. No adenopathy. 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 There are foci of carotid artery calcification bilaterally. There is aortic atherosclerosis. IMPRESSION: No edema or consolidation. Cardiac silhouette within normal limits. There are foci of carotid artery calcification bilaterally. Aortic Atherosclerosis (ICD10-I70.0). Electronically Signed   By: Lowella Grip III M.D.   On: 08/11/2019 07:23   CT Head Wo Contrast  Result Date: 08/11/2019 CLINICAL DATA:  Confusion.  History of multiple strokes EXAM: CT HEAD WITHOUT CONTRAST TECHNIQUE: Contiguous axial images were obtained from the base of the skull through the vertex without intravenous contrast. COMPARISON:  05/27/2019 FINDINGS: Brain: No evidence of acute infarction, hemorrhage, hydrocephalus, extra-axial collection or mass lesion/mass effect. Remote lacunar infarcts at the genu of the left internal capsule, bilateral pons, and at the bilateral medial thalamus. Small vessel ischemic gliosis in the deep cerebral white matter. Overall mild volume loss. Vascular: Confluent arterial calcification at the siphons. Skull: Negative Sinuses/Orbits: Bilateral cataract resection IMPRESSION: 1. No acute finding. 2. Chronic small vessel ischemia with multiple remote lacunar infarcts Electronically Signed   By: Monte Fantasia M.D.   On: 08/11/2019 07:27   MR BRAIN WO CONTRAST  Result  Date: 08/11/2019 CLINICAL DATA:  Encephalopathy.  History of stroke EXAM: MRI HEAD WITHOUT CONTRAST TECHNIQUE: Multiplanar, multiecho pulse sequences of the brain and surrounding structures were obtained without intravenous contrast. COMPARISON:  CT head 08/11/2019.  MRI head 04/30/2019 FINDINGS: Brain: Negative for acute infarct. Mild to moderate atrophy. Moderate to advanced chronic microvascular ischemic changes in the white matter bilaterally. Chronic ischemic change in the thalamus and pons bilaterally. Chronic infarct left basal ganglia. Small chronic infarct left cerebellum. Previously noted subarachnoid hemorrhage in the right sylvian fissure has improved in the interval with a small amount of hemosiderin staining remaining. Small areas of chronic microhemorrhage in the right parietal lobe. No new hemorrhage. Negative for mass or midline shift. Vascular: Normal arterial flow voids. Skull and upper cervical spine: No focal skull lesion. Disc degeneration and spondylosis at C3-4 causing spinal stenosis. Sinuses/Orbits: Paranasal sinuses clear.  Bilateral cataract surgery Other: None IMPRESSION: Negative for acute infarct Atrophy and moderate to advanced chronic ischemic change. Improvement in previously noted subarachnoid hemorrhage in the right sylvian fissure. Electronically Signed   By: Franchot Gallo M.D.   On: 08/11/2019 11:16     Assessment/Plan:  73 y.o. female with medical history significant of type 1 diabetes, hypertension, hyperlipidemia, stroke, hypothyroidism, seizure, gastroparesis, iron deficiency anemia, SAH, who presents with altered mental status.  Pt with transient confusion since yeterday evening when fever was talking to her daughterNo facial droop or slurred speech.  Patient denies any chest pain, shortness of breath, cough.  Upon my examination mentation seems to have improved as patient is able to tell me her name and location.   - Mentation much improved since described - I  don't see any focality on exam - CTH no acute abnormalities.   - Would observe to see how she is doing and hydration - Would hold off any further imaging from neurological stand point at this time unless something changes.  08/11/2019, 3:12 PM

## 2019-08-11 NOTE — ED Notes (Signed)
MD Ivor Costa  at bedside.

## 2019-08-11 NOTE — ED Notes (Signed)
Pt in with co confusion that started yesterday per husband. Pt has history of multiple strokes. Pt alert and oriented to time and place only. Pt states she is normally a&Ox4. Pt denies any pain, but per husband was not able to dress herself properly this morning and did not know her name. Pt has hx of strokes but states symptoms were not the same. Husband states she has not had any recent illness or complaints.

## 2019-08-12 DIAGNOSIS — G9341 Metabolic encephalopathy: Secondary | ICD-10-CM

## 2019-08-12 DIAGNOSIS — L8990 Pressure ulcer of unspecified site, unspecified stage: Secondary | ICD-10-CM | POA: Insufficient documentation

## 2019-08-12 LAB — BASIC METABOLIC PANEL
Anion gap: 5 (ref 5–15)
BUN: 20 mg/dL (ref 8–23)
CO2: 29 mmol/L (ref 22–32)
Calcium: 9.5 mg/dL (ref 8.9–10.3)
Chloride: 107 mmol/L (ref 98–111)
Creatinine, Ser: 0.49 mg/dL (ref 0.44–1.00)
GFR calc Af Amer: >60 mL/min (ref >60)
GFR calc non Af Amer: >60 mL/min (ref >60)
Glucose, Bld: 62 mg/dL — ABNORMAL LOW (ref 70–99)
Potassium: 3.7 mmol/L (ref 3.5–5.1)
Sodium: 141 mmol/L (ref 135–145)

## 2019-08-12 LAB — GLUCOSE, CAPILLARY
Glucose-Capillary: 43 mg/dL — CL (ref 70–99)
Glucose-Capillary: 73 mg/dL (ref 70–99)
Glucose-Capillary: 241 mg/dL — ABNORMAL HIGH (ref 70–99)
Glucose-Capillary: 238 mg/dL — ABNORMAL HIGH (ref 70–99)

## 2019-08-12 LAB — URINE CULTURE: Culture: NO GROWTH

## 2019-08-12 LAB — CBC
HCT: 32.8 % — ABNORMAL LOW (ref 36.0–46.0)
Hemoglobin: 10.8 g/dL — ABNORMAL LOW (ref 12.0–15.0)
MCH: 30.4 pg (ref 26.0–34.0)
MCHC: 32.9 g/dL (ref 30.0–36.0)
MCV: 92.4 fL (ref 80.0–100.0)
Platelets: 249 10*3/uL (ref 150–400)
RBC: 3.55 MIL/uL — ABNORMAL LOW (ref 3.87–5.11)
RDW: 12 % (ref 11.5–15.5)
WBC: 7.6 10*3/uL (ref 4.0–10.5)
nRBC: 0 % (ref 0.0–0.2)

## 2019-08-12 MED ORDER — INSULIN GLARGINE 100 UNIT/ML ~~LOC~~ SOLN
6.0000 [IU] | Freq: Every evening | SUBCUTANEOUS | Status: DC
  Filled 2019-08-12: qty 0.06

## 2019-08-12 MED ORDER — LATANOPROST 0.005 % OP SOLN
1.0000 [drp] | Freq: Every day | OPHTHALMIC | Status: DC
  Filled 2019-08-12: qty 2.5

## 2019-08-12 MED ORDER — INSULIN ASPART 100 UNIT/ML ~~LOC~~ SOLN
0.0000 [IU] | Freq: Three times a day (TID) | SUBCUTANEOUS | Status: DC
  Administered 2019-08-12 (×2): 2 [IU] via SUBCUTANEOUS
  Filled 2019-08-12: qty 1

## 2019-08-12 NOTE — Progress Notes (Signed)
Pt being discharged home, discharge instructions reviewed with pt and husband, states understanding, pt with no complaints

## 2019-08-12 NOTE — Progress Notes (Signed)
Dr Marlowe Sax made aware that pt states that during the night she noticed that she wasn't able to see out of her right eye, pt states that it is her "bad eye" anyway and that she has been seeing an eye doctor and getting monthly injections in the eye, MD acknowledged

## 2019-08-12 NOTE — Progress Notes (Signed)
Inpatient Diabetes Program Recommendations  AACE/ADA: New Consensus Statement on Inpatient Glycemic Control   Target Ranges:  Prepandial:   less than 140 mg/dL      Peak postprandial:   less than 180 mg/dL (1-2 hours)      Critically ill patients:  140 - 180 mg/dL   Results for Erin Daniels, Erin Daniels (MRN [id number]) as of 08/12/2019 06:31  Ref. Range 08/11/2019 06:42 08/11/2019 12:58 08/11/2019 14:26 08/11/2019 16:45 08/11/2019 18:20 08/11/2019 19:48 08/11/2019 23:50 08/12/2019 04:08 08/12/2019 04:27  Glucose-Capillary Latest Ref Range: 70 - 99 mg/dL 157 (H) 405 (H)  Novolog 6 units 351 (H) 204 (H)  Novolog 2 units  Lantus 6 units @17 :09   Novolog 2 units for meal cov 160 (H)  Novolog 1 unit 179 (H)  Novolog 1 unit 43 (LL) 73   Review of Glycemic Control Diabetes history: DM1 (makes NO insulin; requires basal, correction, and carb coverage insulin) Outpatient Diabetes medications: Omni Pod insulin pump with Novolog Current orders for Inpatient glycemic control: Lantus 6 units daily, Novolog 0-6 units Q4H, Novolog 2 units TID with meals for meal coverage  Inpatient Diabetes Program Recommendations:   Insulin-Basal: Please consider changing timing of Lantus to Q24H to start at 17:00 since patient received Lantus around that time yesterday and patient is very sensitive to insulin.  Insulin-Correction: Please consider changing frequency of CBGs and Novolog to 0-6 units TID with meals and Novolog 0-5 units at bedtime.  Thanks, Barnie Alderman, RN, MSN, CDE Diabetes Coordinator Inpatient Diabetes Program [phone] (Team Pager from 8am to 5pm)

## 2019-08-14 NOTE — Discharge Summary (Signed)
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 PCP: Follow-up with PCP in 1 week 2. Follow up LABS/TEST: None  Follow-up Information    Juluis Pitch, MD. Schedule an appointment as soon as possible for a visit in 1 week.   Specialty: Family Medicine Contact information: [place] [address] [phone]        Birder Robson, MD. Go on 08/13/2019.   Specialty: Ophthalmology Contact information: [place] [address] [phone]          Diet recommendation: Cardiac diet  Activity: The patient is advised to gradually reintroduce usual activities,as tolerated  Discharge Condition: good  Code Status: Full code   History of present illness: As per the H and P dictated on admission, "Erin Daniels is a 73 y.o. female with medical history significant of type 1 diabetes on insulin pump, hypertension, hyperlipidemia, stroke, hypothyroidism, seizure, gastroparesis, iron deficiency anemia, SAH, who presents with altered mental status.  Per her husband, pt became confused since yesterday evening when fever was talking to her daughter. She has been persistently confused and seems to be worse this morning. She does not know her name and was not able to do her normal activities.He reports that her baseline she is able to take care of herself and "sharp ". No falls. When I saw pt in ED,  her mental status has improved, she is still mildly confused, but is orientated x3.  She moves all extremities.  No facial droop or slurred speech.  Patient denies any chest pain, shortness of breath, cough.  No nausea, vomiting, diarrhea, abdominal pain, symptoms of UTI.  No seizure activity noted."  Hospital Course:  Summary of her active problems in the hospital is as following. Acute metabolic encephalopathy:  Likely from polypharmacy. CT head negative.  MRI is negative for stroke, but showed improvement of previously noted subarachnoid hemorrhage in the right sylvian fissure.  Currently orientated x3.  Neurology, Dr. Irish Elders was consulted. Since her mental status has already improved. Dr. Irish Elders recommended no further imaging test. Per neurology patient can be discharged from their side. Resuming home regimen. No further work-up. Suspecting this is in the setting of baclofen which was recently started 2 weeks ago for the patient. Recommended to reduce the dose of the baclofen.   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 on insulin pump Home regimen resumed  HTN:  not taking meds at home Monitor.  Subarachnoid hematoma ([place]): Resume home regimen Improvement in previously noted subarachnoid hemorrhage in the right sylvian fissure.  HLD (hyperlipidemia): -lipitor  Hx of Stroke [facility]): Resume home medication  Iron deficiency anemia: Hgb 11.1 -Continue iron supplement  Right eye painless visual loss  patient reported that the night after the admission patient started having visual loss in her right eye and is unable to see anything through that eye. Patient has chronic problems with that eye.  Discussed with ophthalmology on call.  Patient has painless right eye vision loss. Patient does have history of prior retinal injuries. Ophthalmology suspect that is the etiology. Patient to follow-up with ophthalmology tomorrow in the  clinic.  Pressure injury on coccyx. Stage II. POA. Medial. Foam dressing  Pressure Injury 08/11/19 Coccyx Mid Stage 2 -  Partial thickness loss of dermis presenting as a shallow open injury with a red, pink wound bed without slough. (Active)  08/11/19 1600  Location: Coccyx  Location Orientation: Mid  Staging: Stage 2 -  Partial thickness loss of dermis presenting as a shallow open injury with a red, pink wound bed without slough.  Wound Description (Comments):   Present on Admission: Yes   Patient was ambulatory without any assistance. On the day of the discharge the patient's vitals were stable, and no other acute medical condition were reported by patient. the patient was felt safe to be discharge at Home with no therapy needed on discharge.  Consultants: Neurology, phone consultation with ophthalmology Procedures: NONE  DISCHARGE MEDICATION: Allergies as of 08/12/2019      Reactions   Carbocaine [mepivacaine Hcl] Palpitations   Low bp   Hydrocodone Other (See Comments)   Diabetic/  High sugars   Tussionex Pennkinetic Er [hydrocod Polst-cpm Polst Er] Other (See Comments)   Diabetic/  High sugars   Baclofen    confusion   Codeine Nausea Only, Other (See Comments)   Reaction: stress attacks/ headache and nausea Can take Vicodin and Percocet    Meperidine Nausea Only, Other (See Comments)   Reaction: stress attacks/ headache and nausea   Metoclopramide Hcl Nausea Only, Other (See Comments)   Reaction: stress attacks/ headache    Stadol [butorphanol] Nausea Only, Other (See Comments)   Reaction: stress attacks/ headache      Medication List    STOP taking these medications   baclofen 10 MG tablet Commonly known as: LIORESAL     TAKE these medications   acetaminophen 325 MG tablet Commonly known as: TYLENOL Take 2 tablets (650 mg total) by mouth every 6 (six) hours as needed for headache. For mild pain   aspirin EC 81 MG tablet Take 81 mg by mouth daily.     atorvastatin 40 MG tablet Commonly known as: LIPITOR Take 1 tablet (40 mg total) by mouth daily. What changed: when to take this   butalbital-acetaminophen-caffeine 50-325-40 MG tablet Commonly known as: FIORICET Take 0.5 tablets by mouth 3 (three) times daily as needed for headache.   Calcium 600 + D 600-200 MG-UNIT Tabs Generic drug: Calcium Carb-Cholecalciferol Take 1 tablet by mouth daily with supper.   CEROVITE ADVANCED FORMULA PO Take 1 tablet by mouth daily.   cetirizine 10 MG tablet Commonly known as: ZYRTEC Take 1 tablet (10 mg total) by mouth daily.   clopidogrel 75 MG tablet Commonly known as: PLAVIX Take 75 mg by mouth daily with supper.   Combigan 0.2-0.5 % ophthalmic solution Generic drug: brimonidine-timolol Place 1 drop into the right eye 3 (three) times daily. Instill one drop in right eye morning, noon and bedtime   dorzolamide-timolol 22.3-6.8 MG/ML ophthalmic solution Commonly known as: COSOPT Place 1 drop into both eyes daily.   ferrous sulfate 325 (65 FE) MG tablet Take 325 mg by mouth daily with supper.   gabapentin 300 MG capsule Commonly known as: NEURONTIN 300 mg in the morning, 300 mg in the afternoon, 900 mg in the evening What changed:   how much to take  how  to take this  when to take this  additional instructions   Glucagon Emergency 1 MG Kit Inject 1 mg into the muscle once.   glucose 4 GM chewable tablet Chew 1 tablet (4 g total) by mouth as needed for low blood sugar. <65   insulin aspart 100 UNIT/ML injection Commonly known as: novoLOG Inject into the skin continuous. (insulin pump averages about 20u over 24 hours)   olmesartan 20 MG tablet Commonly known as: BENICAR Take 20 mg by mouth every evening.   polyethylene glycol 17 g packet Commonly known as: MIRALAX / GLYCOLAX Take 17 g by mouth every other day.   promethazine 25 MG suppository Commonly known as: PHENERGAN Place 25 mg rectally every 6 (six) hours as  needed for nausea or vomiting.   Rocklatan 0.02-0.005 % Soln Generic drug: Netarsudil-Latanoprost Place 1 drop into the right eye at bedtime.   Systane Ultra 0.4-0.3 % Soln Generic drug: Polyethyl Glycol-Propyl Glycol Place 1-2 drops into both eyes daily as needed (dry eyes).   venlafaxine 75 MG tablet Commonly known as: EFFEXOR Take 1 tablet (75 mg total) by mouth 2 (two) times daily.   vitamin C 500 MG tablet Commonly known as: ASCORBIC ACID Take 500 mg by mouth 2 (two) times daily.   Vitamin D-3 125 MCG (5000 UT) Tabs Take 5,000 Units by mouth daily.      Allergies  Allergen Reactions  . Carbocaine [Mepivacaine Hcl] Palpitations    Low bp  . Hydrocodone Other (See Comments)    Diabetic/  High sugars  . Tussionex Pennkinetic Er [Hydrocod Polst-Cpm Polst Er] Other (See Comments)    Diabetic/  High sugars  . Baclofen     confusion  . Codeine Nausea Only and Other (See Comments)    Reaction: stress attacks/ headache and nausea Can take Vicodin and Percocet   . Meperidine Nausea Only and Other (See Comments)    Reaction: stress attacks/ headache and nausea  . Metoclopramide Hcl Nausea Only and Other (See Comments)    Reaction: stress attacks/ headache   . Stadol [Butorphanol] Nausea Only and Other (See Comments)    Reaction: stress attacks/ headache   Discharge Instructions    Diet - low sodium heart healthy   Complete by: As directed    Increase activity slowly   Complete by: As directed      Discharge Exam: Filed Weights   08/11/19 0646 08/11/19 1759  Weight: 47.2 kg 47.2 kg   Vitals:   08/12/19 0410 08/12/19 0743  BP: (!) 171/72 (!) 150/65  Pulse: 72 80  Resp: 17 17  Temp: 98 F (36.7 C) (!) 97.5 F (36.4 C)  SpO2: 100% 97%   General: Appear in mild distress, no Rash; Oral Mucosa Clear, moist. no Abnormal Mass Or lumps Cardiovascular: S1 and S2 Present, no Murmur, Respiratory: normal respiratory effort, Bilateral Air entry present and Clear to  Auscultation, n Crackles, no wheezes Abdomen: Bowel Sound present, Soft and no tenderness, no hernia Extremities: no Pedal edema, no calf tenderness Neurology: alert and oriented to time, place, and person affect appropriate.,  Right eye visual loss painless  The results of significant diagnostics from this hospitalization (including imaging, microbiology, ancillary and laboratory) are listed below for reference.    Significant Diagnostic Studies: DG Chest 2 View  Result Date: 08/11/2019 CLINICAL DATA:  Confusion EXAM: CHEST - 2 VIEW COMPARISON:  December 06, 2018 FINDINGS: There is no edema or consolidation. Heart size and pulmonary vascularity are normal. No adenopathy.  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 There are foci of carotid artery calcification bilaterally. There is aortic atherosclerosis. IMPRESSION: No edema or consolidation. Cardiac silhouette within normal limits. There are foci of carotid artery calcification bilaterally. Aortic Atherosclerosis (ICD10-I70.0). Electronically Signed   By: Lowella Grip III M.D.   On: 08/11/2019 07:23   CT Head Wo Contrast  Result Date: 08/11/2019 CLINICAL DATA:  Confusion.  History of multiple strokes EXAM: CT HEAD WITHOUT CONTRAST TECHNIQUE: Contiguous axial images were obtained from the base of the skull through the vertex without intravenous contrast. COMPARISON:  05/27/2019 FINDINGS: Brain: No evidence of acute infarction, hemorrhage, hydrocephalus, extra-axial collection or mass lesion/mass effect. Remote lacunar infarcts at the genu of the left internal capsule, bilateral pons, and at the bilateral medial thalamus. Small vessel ischemic gliosis in the deep cerebral white matter. Overall mild volume loss. Vascular: Confluent arterial calcification at the siphons. Skull: Negative Sinuses/Orbits: Bilateral cataract resection IMPRESSION: 1. No acute finding. 2. Chronic small vessel ischemia with multiple remote lacunar infarcts  Electronically Signed   By: Monte Fantasia M.D.   On: 08/11/2019 07:27   MR BRAIN WO CONTRAST  Result Date: 08/11/2019 CLINICAL DATA:  Encephalopathy.  History of stroke EXAM: MRI HEAD WITHOUT CONTRAST TECHNIQUE: Multiplanar, multiecho pulse sequences of the brain and surrounding structures were obtained without intravenous contrast. COMPARISON:  CT head 08/11/2019.  MRI head 04/30/2019 FINDINGS: Brain: Negative for acute infarct. Mild to moderate atrophy. Moderate to advanced chronic microvascular ischemic changes in the white matter bilaterally. Chronic ischemic change in the thalamus and pons bilaterally. Chronic infarct left basal ganglia. Small chronic infarct left cerebellum. Previously noted subarachnoid hemorrhage in the right sylvian fissure has improved in the interval with a small amount of hemosiderin staining remaining. Small areas of chronic microhemorrhage in the right parietal lobe. No new hemorrhage. Negative for mass or midline shift. Vascular: Normal arterial flow voids. Skull and upper cervical spine: No focal skull lesion. Disc degeneration and spondylosis at C3-4 causing spinal stenosis. Sinuses/Orbits: Paranasal sinuses clear.  Bilateral cataract surgery Other: None IMPRESSION: Negative for acute infarct Atrophy and moderate to advanced chronic ischemic change. Improvement in previously noted subarachnoid hemorrhage in the right sylvian fissure. Electronically Signed   By: Franchot Gallo M.D.   On: 08/11/2019 11:16    Microbiology: Recent Results (from the past 240 hour(s))  Urine culture     Status: None   Collection Time: 08/11/19  8:56 AM   Specimen: Urine, Random  Result Value Ref Range Status   Specimen Description   Final    URINE, RANDOM Performed at [facility], [address] [place], [place] [address]    Special Requests   Final    NONE Performed at [facility], [address], [place], [place] [address]    Culture   Final    NO  GROWTH Performed at [facility], [place] [address] [place], [place] [address]    Report Status 08/12/2019 FINAL  Final  SARS CORONAVIRUS 2 (TAT 6-24 HRS) Nasopharyngeal Nasopharyngeal Swab     Status: None   Collection Time: 08/11/19 10:35 AM   Specimen: Nasopharyngeal Swab  Result Value Ref Range Status   SARS Coronavirus 2 NEGATIVE NEGATIVE Final    Comment: (NOTE) SARS-CoV-2 target nucleic acids are NOT DETECTED. The SARS-CoV-2 RNA is generally detectable in upper and lower respiratory specimens during the acute phase of infection. Negative results do not preclude SARS-CoV-2 infection, do not rule out co-infections  with other pathogens, and should not be used as the sole basis for treatment or other patient management decisions. Negative results must be combined with clinical observations, patient history, and epidemiological information. The expected result is Negative. Fact Sheet for Patients: SugarRoll.be Fact Sheet for Healthcare Providers: https://www.woods-mathews.com/ This test is not yet approved or cleared by the [place] FDA and  has been authorized for detection and/or diagnosis of SARS-CoV-2 by FDA under an Emergency Use Authorization (EUA). This EUA will remain  in effect (meaning this test can be used) for the duration of the COVID-19 declaration under Section 56 4(b)(1) of the Act, 21 U.S.C. section 360bbb-3(b)(1), unless the authorization is terminated or revoked sooner. Performed at [facility], [place] [address] [place], [place] [address]      Labs: CBC: Recent Labs  Lab 08/11/19 0651 08/12/19 0350  WBC 7.1 7.6  HGB 11.1* 10.8*  HCT 33.5* 32.8*  MCV 92.5 92.4  PLT 245 031   Basic Metabolic Panel: Recent Labs  Lab 08/11/19 0651 08/11/19 1428 08/12/19 0350  NA 142 138 141  K 3.7 4.1 3.7  CL 107 104 107  CO2 23 23 29   GLUCOSE 172* 383* 62*  BUN 21 26* 20  CREATININE 0.55 0.65 0.49   CALCIUM 9.3 9.2 9.5   Liver Function Tests: Recent Labs  Lab 08/11/19 0651  AST 20  ALT 17  ALKPHOS 74  BILITOT 0.7  PROT 7.0  ALBUMIN 3.9   No results for input(s): LIPASE, AMYLASE in the last 168 hours. No results for input(s): AMMONIA in the last 168 hours. Cardiac Enzymes: No results for input(s): CKTOTAL, CKMB, CKMBINDEX, TROPONINI in the last 168 hours. BNP (last 3 results) No results for input(s): BNP in the last 8760 hours. CBG: Recent Labs  Lab 08/11/19 2350 08/12/19 0408 08/12/19 0427 08/12/19 0742 08/12/19 1130  GLUCAP 179* 43* 73 241* 238*    Time spent: 35 minutes  Signed:  Berle Mull  [facility] 08/12/2019 6:12 PM

## 2019-08-21 ENCOUNTER — Emergency Department
Admission: EM | Admit: 2019-08-21 | Discharge: 2019-08-21 | Disposition: A | Discharge status: Home / Self Care | Payer: Medicare Other | Attending: Emergency Medicine | Admitting: Emergency Medicine

## 2019-08-21 ENCOUNTER — Other Ambulatory Visit: Payer: Self-pay

## 2019-08-21 ENCOUNTER — Encounter: Payer: Self-pay | Admitting: Emergency Medicine

## 2019-08-21 ENCOUNTER — Emergency Department: Payer: Medicare Other

## 2019-08-21 DIAGNOSIS — E109 Type 1 diabetes mellitus without complications: Secondary | ICD-10-CM | POA: Insufficient documentation

## 2019-08-21 DIAGNOSIS — Z7901 Long term (current) use of anticoagulants: Secondary | ICD-10-CM | POA: Diagnosis not present

## 2019-08-21 DIAGNOSIS — Z79899 Other long term (current) drug therapy: Secondary | ICD-10-CM | POA: Insufficient documentation

## 2019-08-21 DIAGNOSIS — S299XXA Unspecified injury of thorax, initial encounter: Secondary | ICD-10-CM | POA: Diagnosis present

## 2019-08-21 DIAGNOSIS — S2232XA Fracture of one rib, left side, initial encounter for closed fracture: Secondary | ICD-10-CM | POA: Diagnosis not present

## 2019-08-21 DIAGNOSIS — Z96641 Presence of right artificial hip joint: Secondary | ICD-10-CM | POA: Insufficient documentation

## 2019-08-21 DIAGNOSIS — Z7982 Long term (current) use of aspirin: Secondary | ICD-10-CM | POA: Diagnosis not present

## 2019-08-21 DIAGNOSIS — Y999 Unspecified external cause status: Secondary | ICD-10-CM | POA: Diagnosis not present

## 2019-08-21 DIAGNOSIS — I10 Essential (primary) hypertension: Secondary | ICD-10-CM | POA: Insufficient documentation

## 2019-08-21 DIAGNOSIS — Y9389 Activity, other specified: Secondary | ICD-10-CM | POA: Insufficient documentation

## 2019-08-21 DIAGNOSIS — Y92000 Kitchen of unspecified non-institutional (private) residence as  the place of occurrence of the external cause: Secondary | ICD-10-CM | POA: Diagnosis not present

## 2019-08-21 DIAGNOSIS — Z8673 Personal history of transient ischemic attack (TIA), and cerebral infarction without residual deficits: Secondary | ICD-10-CM | POA: Insufficient documentation

## 2019-08-21 DIAGNOSIS — W010XXA Fall on same level from slipping, tripping and stumbling without subsequent striking against object, initial encounter: Secondary | ICD-10-CM | POA: Diagnosis not present

## 2019-08-21 MED ORDER — LIDOCAINE 5 % EX PTCH: 1.0000 | MEDICATED_PATCH | CUTANEOUS | 0 refills | Status: DC

## 2019-08-21 MED ORDER — OXYCODONE-ACETAMINOPHEN 5-325 MG PO TABS: 0.5000 | ORAL_TABLET | Freq: Three times a day (TID) | ORAL | 0 refills | Status: DC | PRN

## 2019-08-21 NOTE — ED Triage Notes (Signed)
Pt here for left rib pain after mechaincal fall 2 days ago.  Tried to turn and see something and walker turned over.  Hit left ribs on side of bed. Did not hit head. Is on plavix. No bruising or hematoma seen.  Unlabored. VSS. No increased WOB

## 2019-08-21 NOTE — ED Provider Notes (Signed)
[facility] Emergency Department Provider Note  ____________________________________________  Time seen: Approximately 3:49 PM  I have reviewed the triage vital signs and the nursing notes.   HISTORY  Chief Complaint Rib Injury    HPI Erin Daniels is a 73 y.o. female presents to the emergency department for evaluation of left-sided rib pain after a fall 2 to 3 days ago.  Patient states that her husband dropped something in the kitchen and she was using her walker and turned too quickly to see, and fell.  She just lost her balance trying to see what her husband had dropped.  She hit her left rib cage on the floor.  She did not hit her head or lose consciousness.  She has been walking since the fall.  No hip pain.  Pain to her rib cage has not improved so she came to the emergency department to see if she has broken anything.  Patient was in the hospital 10 days ago for altered mental status.  Patient states that she feels completely improved since this hospital stay.  She has not had any more confusion since her previous hospital stay.  No headaches, dizziness.  She has had no recent illness.  No fevers.  No shortness of breath, chest pain, vomiting, abdominal pain.   Past Medical History:  Diagnosis Date  . At high risk for falls   . Cancer ([place])   . Complication of anesthesia    difficult to arouse  . Diabetes mellitus without complication (HCC)    Type I  . Diabetic neuropathy ([place])   . Diabetic retinopathy ([place])   . Dizziness   . Gastroparesis   . Herniated disc, cervical   . Hypercholesteremia   . Hypertension   . Hypotensive episode   . Hypothyroidism   . Insulin pump in place   . Myofascial muscle pain   . Numbness of arm    left  . Osteoporosis   . Poor vision   . Seizures ([place])   . Slurred speech   . Stroke (cerebrum) ([place])    4/19  . Stroke (HCC)    x5  . Vertigo   . Wears glasses     Patient Active Problem List   Diagnosis  Date Noted  . Pressure injury of skin 08/12/2019  . Acute metabolic encephalopathy [id number]  . HLD (hyperlipidemia) 08/11/2019  . Stroke ([place]) 08/11/2019  . Iron deficiency anemia 08/11/2019  . Subarachnoid hematoma ([place]) 04/30/2019  . Intertrochanteric fracture of right hip ([place]) 12/08/2018  . Hypercoagulopathy ([place]) 09/29/2018  . Acute anxiety 09/29/2018  . Hypertension associated with type 1 diabetes mellitus ([place]) 09/19/2018  . Dyslipidemia due to type 1 diabetes mellitus ([place]) 09/19/2018  . Anemia of chronic disease 09/19/2018  . Chronic migraine without aura 09/19/2018  . Chronic non-seasonal allergic rhinitis 09/19/2018  . Major depression, recurrent, chronic ([place]) 09/19/2018  . Diabetic gastroparesis associated with type 1 diabetes mellitus ([place]) 09/19/2018  . Bilateral sacral insufficiency fracture 09/19/2018  . Constipation 09/15/2018  . Acute urinary retention 09/15/2018  . Intractable pain 09/15/2018  . Chronic cerebrovascular accident (CVA) 05/28/2018  . Diabetic polyneuropathy associated with type 1 diabetes mellitus ([place]) 12/10/2017  . Numbness and tingling of both legs 12/10/2017  . Chronic pain syndrome 12/10/2017  . DKA (diabetic ketoacidoses) ([place]) 11/12/2017  . Confusion 11/11/2017  . Hypoglycemia 11/26/2016  . Lactic acid acidosis   . Sepsis ([place])   . 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 Essential hypertension   . Depression   . Right radial fracture 05/29/2015    Past Surgical History:  Procedure Laterality Date  . APPENDECTOMY    . AUGMENTATION MAMMAPLASTY Bilateral 2011  . BILATERAL CARPAL TUNNEL RELEASE    . COLONOSCOPY    . EYE SURGERY Bilateral    cataract  . HEMORROIDECTOMY    . HERNIA REPAIR     umbilical  . INTRAMEDULLARY (IM) NAIL INTERTROCHANTERIC Right 12/07/2018   Procedure: INTRAMEDULLARY (IM) NAIL INTERTROCHANTRIC;  Surgeon: Thornton Park, MD;  Location: [facility] ORS;  Service: Orthopedics;   Laterality: Right;  . JOINT REPLACEMENT     right hip  . OPEN REDUCTION INTERNAL FIXATION (ORIF) DISTAL RADIAL FRACTURE Right 05/29/2015   Procedure: OPEN REDUCTION INTERNAL FIXATION (ORIF) RIGHT DISTAL RADIUS FRACTURE WITH ALLOGRAFT BONE GRAFTING FOR REPAIR AND RECONSTRUCTION;  Surgeon: Roseanne Kaufman, MD;  Location: [facility];  Service: Orthopedics;  Laterality: Right;  . OPEN REDUCTION INTERNAL FIXATION (ORIF) METACARPAL Right 01/09/2019   Procedure: OPEN REDUCTION INTERNAL FIXATION (ORIF) RIGHT SECOND METACARPAL FRACTURE;  Surgeon: Corky Mull, MD;  Location: [facility] ORS;  Service: Orthopedics;  Laterality: Right;  . SHOULDER ARTHROSCOPY Left   . SHOULDER HEMI-ARTHROPLASTY Left   . SYNOVECTOMY    . TONSILLECTOMY    . TRIGGER FINGER RELEASE    . TUBAL LIGATION      Prior to Admission medications   Medication Sig Start Date End Date Taking? Authorizing Provider  acetaminophen (TYLENOL) 325 MG tablet Take 2 tablets (650 mg total) by mouth every 6 (six) hours as needed for headache. For mild pain 09/18/18   Saundra Shelling, MD  aspirin EC 81 MG tablet Take 81 mg by mouth daily.    [provider]  atorvastatin (LIPITOR) 40 MG tablet Take 1 tablet (40 mg total) by mouth daily. Patient taking differently: Take 40 mg by mouth daily at 6 PM.  10/10/18   Medina-Vargas, Monina C, NP  brimonidine-timolol (COMBIGAN) 0.2-0.5 % ophthalmic solution Place 1 drop into the right eye 3 (three) times daily. Instill one drop in right eye morning, noon and bedtime     [provider]  butalbital-acetaminophen-caffeine (FIORICET) 50-325-40 MG tablet Take 0.5 tablets by mouth 3 (three) times daily as needed for headache.    [provider]  Calcium Carb-Cholecalciferol (CALCIUM 600 + D) 600-200 MG-UNIT TABS Take 1 tablet by mouth daily with supper.     [provider]  cetirizine (ZYRTEC) 10 MG tablet Take 1 tablet (10 mg total) by mouth daily. 10/10/18   Medina-Vargas, Monina C, NP   Cholecalciferol (VITAMIN D-3) 5000 units TABS Take 5,000 Units by mouth daily.    [provider]  clopidogrel (PLAVIX) 75 MG tablet Take 75 mg by mouth daily with supper.    [provider]  dorzolamide-timolol (COSOPT) 22.3-6.8 MG/ML ophthalmic solution Place 1 drop into both eyes daily. 04/14/19   [provider]  ferrous sulfate 325 (65 FE) MG tablet Take 325 mg by mouth daily with supper.     [provider]  gabapentin (NEURONTIN) 300 MG capsule 300 mg in the morning, 300 mg in the afternoon, 900 mg in the evening Patient taking differently: Take 300-900 mg by mouth See admin instructions. Take 1 capsule (300mg ) by mouth every morning, 1 capsule (300mg ) by mouth at lunch and take 3 capsules (900mg ) by mouth every night at bedtime 10/10/18   Medina-Vargas, Monina C, NP  GLUCAGON EMERGENCY 1 MG injection Inject  1 mg into the muscle once. 01/24/19   [provider]  glucose 4 GM chewable tablet Chew 1 tablet (4 g total) by mouth as needed for low blood sugar. <65 10/10/18   Medina-Vargas, Monina C, NP  insulin aspart (NOVOLOG) 100 UNIT/ML injection Inject into the skin continuous. (insulin pump averages about 20u over 24 hours)    [provider]  lidocaine (LIDODERM) 5 % Place 1 patch onto the skin daily. Remove & Discard patch within 12 hours or as directed by MD 08/21/19   Laban Emperor, PA-C  Multiple Vitamins-Minerals (CEROVITE ADVANCED FORMULA PO) Take 1 tablet by mouth daily. 09/18/18   [provider]  Netarsudil-Latanoprost (ROCKLATAN) 0.02-0.005 % SOLN Place 1 drop into the right eye at bedtime.     [provider]  olmesartan (BENICAR) 20 MG tablet Take 20 mg by mouth every evening.  02/28/19   [provider]  oxyCODONE-acetaminophen (PERCOCET) 5-325 MG tablet Take 0.5 tablets by mouth every 8 (eight) hours as needed. 08/21/19 08/20/20  Laban Emperor, PA-C  Polyethyl Glycol-Propyl Glycol (SYSTANE ULTRA) 0.4-0.3  % SOLN Place 1-2 drops into both eyes daily as needed (dry eyes).    [provider]  polyethylene glycol (MIRALAX / GLYCOLAX) packet Take 17 g by mouth every other day.     [provider]  promethazine (PHENERGAN) 25 MG suppository Place 25 mg rectally every 6 (six) hours as needed for nausea or vomiting.    [provider]  venlafaxine (EFFEXOR) 75 MG tablet Take 1 tablet (75 mg total) by mouth 2 (two) times daily. 10/10/18   Medina-Vargas, Monina C, NP  vitamin C (ASCORBIC ACID) 500 MG tablet Take 500 mg by mouth 2 (two) times daily.     [provider]    Allergies Carbocaine [mepivacaine hcl], Hydrocodone, Tussionex pennkinetic er [hydrocod polst-cpm polst er], Baclofen, Codeine, Meperidine, Metoclopramide hcl, and Stadol [butorphanol]  Family History  Problem Relation Age of Onset  . Hypertension Mother   . Stroke Mother   . Heart failure Father     Social History Social History   Tobacco Use  . Smoking status: Never Smoker  . Smokeless tobacco: Never Used  Substance Use Topics  . Alcohol use: No  . Drug use: No     Review of Systems  Constitutional: No fever/chills ENT: No upper respiratory complaints. Cardiovascular: No chest pain. Respiratory: No cough. No SOB. Gastrointestinal: No abdominal pain.  No nausea, no vomiting.  Musculoskeletal: Positive for rib pain. Skin: Negative for rash, abrasions, lacerations, ecchymosis. Neurological: Negative for headaches, numbness or tingling   ____________________________________________   PHYSICAL EXAM:  VITAL SIGNS: ED Triage Vitals  Enc Vitals Group     BP 08/21/19 1457 122/70     Pulse Rate 08/21/19 1457 87     Resp 08/21/19 1457 16     Temp 08/21/19 1457 98.2 F (36.8 C)     Temp Source 08/21/19 1457 Oral     SpO2 08/21/19 1457 96 %     Weight 08/21/19 1456 103 lb 15.9 oz (47.2 kg)     Height --      Head Circumference --      Peak Flow --      Pain Score 08/21/19 1456 5      Pain Loc --      Pain Edu? --      Excl. in [place]? --      Constitutional: Alert and oriented. Well appearing and in no acute distress.  Eyes: Conjunctivae are normal. PERRL. EOMI. Head: Atraumatic. ENT:      Ears:      Nose: No congestion/rhinnorhea.      Mouth/Throat: Mucous membranes are moist.  Neck: No stridor.  No cervical spine tenderness to palpation. Cardiovascular: Normal rate, regular rhythm.  Good peripheral circulation. Respiratory: Normal respiratory effort without tachypnea or retractions. Lungs CTAB. Good air entry to the bases with no decreased or absent breath sounds. Gastrointestinal:  Soft and nontender to palpation. No guarding or rigidity. No palpable masses. No distention.  Musculoskeletal: Full range of motion to all extremities. No gross deformities appreciated.  Pinpoint tenderness to palpation to left anterior lateral inferior rib cage, around the 10th rib.  No overlying ecchymosis. Neurologic:  Normal speech and language. No gross focal neurologic deficits are appreciated.  Skin:  Skin is warm, dry and intact. No rash noted. Psychiatric: Mood and affect are normal. Speech and behavior are normal. Patient exhibits appropriate insight and judgement.   ____________________________________________   LABS (all labs ordered are listed, but only abnormal results are displayed)  Labs Reviewed - No data to display ____________________________________________  EKG   ____________________________________________  RADIOLOGY Robinette Haines, personally viewed and evaluated these images (plain radiographs) as part of my medical decision making, as well as reviewing the written report by the radiologist.  DG Ribs Unilateral W/Chest Left  Result Date: 08/21/2019 CLINICAL DATA:  Pt here for left rib pain after mechaincal fall 2 days ago. Tried to turn and see something and walker turned over. Hit left ribs on side of bed. Did not hit head. Is on plavix. No  bruising or hematoma seen. EXAM: LEFT RIBS AND CHEST - 3+ VIEW COMPARISON:  Chest radiograph 08/11/2019 FINDINGS: There is a mildly displaced fracture of the anterolateral approximate tenth rib, which corresponds to the patient's site of pain. Stable cardiomediastinal contours. Minimal opacities at the left base likely reflect atelectasis or scarring. No pneumothorax. IMPRESSION: 1. Mildly displaced fracture of the anterolateral left approximate tenth rib. No pneumothorax. 2. Minimal atelectasis or scarring at the left base. Electronically Signed   By: Audie Pinto M.D.   On: 08/21/2019 15:39    ____________________________________________    PROCEDURES  Procedure(s) performed:    Procedures    Medications - No data to display   ____________________________________________   INITIAL IMPRESSION / ASSESSMENT AND PLAN / ED COURSE  Pertinent labs & imaging results that were available during my care of the patient were reviewed by me and considered in my medical decision making (see chart for details).  Review of the [place] CSRS was performed in accordance of the [place] prior to dispensing any controlled drugs.     Patient's diagnosis is consistent with rib fracture following mechanical fall.  Vital signs and exam are reassuring.  Exam is consistent with a rib fracture.  Patient denies any additional symptoms or concerns.  She is overall well-appearing.  She declines any pain medicine in the emergency department.  She has been ambulating with her walker since the fall.  She lives with her husband.  Patient will be discharged home with prescriptions for tramadol. Patient is to follow up with primary care as directed.  She states that she has a follow-up appointment in a couple of days.  Patient is given ED precautions to return to the ED for any worsening or new symptoms.  Ebonnie Staheli Esham was evaluated in Emergency Department on 08/21/2019 for the symptoms described in the history of  present illness. She  was evaluated in the context of the global COVID-19 pandemic, which necessitated consideration that the patient might be at risk for infection with the SARS-CoV-2 virus that causes COVID-19. Institutional protocols and algorithms that pertain to the evaluation of patients at risk for COVID-19 are in a state of rapid change based on information released by regulatory bodies including the CDC and federal and state organizations. These policies and algorithms were followed during the patient's care in the ED.   ____________________________________________  FINAL CLINICAL IMPRESSION(S) / ED DIAGNOSES  Final diagnoses:  Closed fracture of one rib of left side, initial encounter      NEW MEDICATIONS STARTED DURING THIS VISIT:  ED Discharge Orders         Ordered    oxyCODONE-acetaminophen (PERCOCET) 5-325 MG tablet  Every 8 hours PRN,   Status:  Discontinued     08/21/19 1615    lidocaine (LIDODERM) 5 %  Every 24 hours,   Status:  Discontinued     08/21/19 1615    lidocaine (LIDODERM) 5 %  Every 24 hours     08/21/19 1623    oxyCODONE-acetaminophen (PERCOCET) 5-325 MG tablet  Every 8 hours PRN     08/21/19 1623              This chart was dictated using voice recognition software/Dragon. Despite best efforts to proofread, errors can occur which can change the meaning. Any change was purely unintentional.    Laban Emperor, PA-C 08/21/19 2250    Harvest Dark, MD 08/21/19 2329

## 2019-08-21 NOTE — Discharge Instructions (Addendum)
You have broken your 10th rib.  You can ibuprofen for pain.  You may take oxycodone for extreme pain.  Discontinue the tramadol if it makes you drowsy.  Use Lidoderm patches for pain as well.  Please continue to use your walker.  Please follow-up with primary care early next week.

## 2019-08-25 ENCOUNTER — Other Ambulatory Visit: Payer: Self-pay | Admitting: Student in an Organized Health Care Education/Training Program

## 2019-08-26 ENCOUNTER — Encounter: Payer: Self-pay | Admitting: Student in an Organized Health Care Education/Training Program

## 2019-08-27 ENCOUNTER — Ambulatory Visit
Payer: Medicare Other | Attending: Student in an Organized Health Care Education/Training Program | Admitting: Student in an Organized Health Care Education/Training Program

## 2019-08-27 ENCOUNTER — Encounter: Payer: Self-pay | Admitting: Student in an Organized Health Care Education/Training Program

## 2019-08-27 ENCOUNTER — Other Ambulatory Visit: Payer: Self-pay

## 2019-08-27 DIAGNOSIS — G44229 Chronic tension-type headache, not intractable: Secondary | ICD-10-CM | POA: Diagnosis not present

## 2019-08-27 MED ORDER — BUTALBITAL-APAP-CAFFEINE 50-325-40 MG PO TABS: 1.0000 | ORAL_TABLET | Freq: Two times a day (BID) | ORAL | 2 refills | Status: DC | PRN

## 2019-08-27 NOTE — Progress Notes (Signed)
Virtual Encounter - Pain Management     Contact & Pharmacy Preferred: [phone] Home: [phone] (home) Mobile: [phone] (mobile) E-mail: [email or web address]  [place], [place] - [place] [address] [place] [address] Phone: [phone] Fax: [phone]  [facility] [place], [place] [address] [place] [address] Phone: [phone] Fax: [place] [id number], [place] [address] [place] [address] Phone: [phone] Fax: [phone]  [facility] - [address] [place], [place] [address] Phone: [phone] Fax: [phone]  [facility] [place], [place] - [place] [address] [place] [address] [place] [address] Phone: [phone] Fax: [phone]   Pre-screening  Erin Daniels offered "in-person" vs "virtual" encounter. She indicated preferring virtual for this encounter.   Reason COVID-19*  Social distancing based on CDC and AMA recommendations.   I contacted Erin Daniels on 08/27/2019 via telephone.      I clearly identified myself as Gillis Santa, MD. I verified that I was speaking with the correct person using two identifiers (Name: Erin Daniels, and date of birth: 10/02/1945).  This visit was completed via telephone due to the restrictions of the COVID-19 pandemic. All issues as above were discussed and addressed but no physical exam was performed. If it was felt that the patient should be evaluated in the office, they were directed there. The patient verbally consented to this visit. Patient was unable to complete an audio/visual visit due to Technical difficulties and/or Lack of internet. Due to the catastrophic nature of the COVID-19  pandemic, this visit was done through audio contact only.  Location of the patient: home address (see Epic for details)  Location of the provider: office  Consent I sought verbal advanced consent from Erin Daniels for virtual visit interactions. I informed Erin Daniels of possible security and privacy concerns, risks, and limitations associated with providing "not-in-person" medical evaluation and management services. I also informed Erin Daniels of the availability of "in-person" appointments. Finally, I informed her that there would be a charge for the virtual visit and that she could be  personally, fully or partially, financially responsible for it. Erin Daniels expressed understanding and agreed to proceed.   Historic Elements   Erin Daniels is a 74 y.o. year old, female patient evaluated today after her last encounter by our practice on 08/25/2019. Erin Daniels  has a past medical history of At high risk for falls, Cancer ([place]), Complication of anesthesia, Diabetes mellitus without complication ([place]), Diabetic neuropathy ([place]), Diabetic retinopathy ([place]), Dizziness, Gastroparesis, Herniated disc, cervical, Hypercholesteremia, Hypertension, Hypotensive episode, Hypothyroidism, Insulin pump in place, Myofascial muscle pain, Numbness of arm, Osteoporosis, Poor vision, Seizures ([place]), Slurred speech, Stroke (cerebrum) ([place]), Stroke ([name]), Vertigo, and Wears glasses. She also  has a past surgical history that includes Tonsillectomy; Hemorroidectomy; Appendectomy; Tubal ligation; Trigger finger release; Bilateral carpal tunnel release; Synovectomy; Colonoscopy; Shoulder arthroscopy (Left); Shoulder hemi-arthroplasty (Left); Open reduction internal fixation (orif) distal radial fracture (Right, 05/29/2015); Augmentation mammaplasty (Bilateral, 2011); Intramedullary (im) nail intertrochanteric (Right, 12/07/2018); Eye surgery (Bilateral); Hernia repair; Joint replacement; and Open reduction  internal fixation (orif) metacarpal (Right, 01/09/2019). Erin Daniels has a current medication list which includes the following prescription(s): acetaminophen, aspirin ec, atorvastatin, brimonidine-timolol,  butalbital-acetaminophen-caffeine, calcium carb-cholecalciferol, cetirizine, vitamin d-3, clopidogrel, dorzolamide-timolol, ferrous sulfate, gabapentin, glucose, insulin aspart, lidocaine, multiple vitamins-minerals, netarsudil-latanoprost, olmesartan, systane ultra, polyethylene glycol, promethazine, venlafaxine, vitamin c, glucagon emergency, and oxycodone-acetaminophen. She  reports that she has never smoked. She has never used smokeless tobacco. She reports that she does not drink alcohol or use drugs. Erin Daniels is allergic to carbocaine [mepivacaine hcl]; hydrocodone; tussionex pennkinetic er [hydrocod polst-cpm polst er]; baclofen; codeine; meperidine; metoclopramide hcl; and stadol [butorphanol].   HPI  Today, she is being contacted for medication management.   Requesting refill on Fiorcet for headache management.  Laboratory Chemistry Profile (12 mo)  Renal: 08/12/2019: BUN 20; Creatinine, Ser 0.49  Lab Results  Component Value Date   GFRAA >60 08/12/2019   GFRNONAA >60 08/12/2019   Hepatic: 08/11/2019: Albumin 3.9 Lab Results  Component Value Date   AST 20 08/11/2019   ALT 17 08/11/2019   Other: No results found for requested labs within last 8760 hours. Note: Above Lab results reviewed.  Imaging  DG Ribs Unilateral W/Chest Left CLINICAL DATA:  Pt here for left rib pain after mechaincal fall 2 days ago. Tried to turn and see something and walker turned over. Hit left ribs on side of bed. Did not hit head. Is on plavix. No bruising or hematoma seen.  EXAM: LEFT RIBS AND CHEST - 3+ VIEW  COMPARISON:  Chest radiograph 08/11/2019  FINDINGS: There is a mildly displaced fracture of the anterolateral approximate tenth rib, which corresponds to the patient's site  of pain.  Stable cardiomediastinal contours. Minimal opacities at the left base likely reflect atelectasis or scarring. No pneumothorax.  IMPRESSION: 1. Mildly displaced fracture of the anterolateral left approximate tenth rib. No pneumothorax. 2. Minimal atelectasis or scarring at the left base.  Electronically Signed   By: Audie Pinto M.D.   On: 08/21/2019 15:39   Assessment  The encounter diagnosis was Chronic tension-type headache, not intractable.  Plan of Care  I have changed Erin Daniels's butalbital-acetaminophen-caffeine. I am also having her maintain her polyethylene glycol, vitamin C, Calcium Carb-Cholecalciferol, Vitamin D-3, acetaminophen, Multiple Vitamins-Minerals (CEROVITE ADVANCED FORMULA PO), atorvastatin, cetirizine, venlafaxine, glucose, gabapentin, Netarsudil-Latanoprost, brimonidine-timolol, ferrous sulfate, promethazine, olmesartan, Glucagon Emergency, dorzolamide-timolol, insulin aspart, aspirin EC, clopidogrel, Systane Ultra, lidocaine, and oxyCODONE-acetaminophen.  Pharmacotherapy (Medications Ordered): Meds ordered this encounter  Medications  . butalbital-acetaminophen-caffeine (FIORICET) 50-325-40 MG tablet    Sig: Take 1 tablet by mouth 2 (two) times daily as needed for headache or migraine.    Dispense:  180 tablet    Refill:  2   Follow-up plan:   Return if symptoms worsen or fail to improve.    Recent Visits No visits were found meeting these conditions.  Showing recent visits within past 90 days and meeting all other requirements   Today's Visits Date Type Provider Dept  08/27/19 Office Visit Gillis Santa, MD [facility]  Showing today's visits and meeting all other requirements   Future Appointments No visits were found meeting these conditions.  Showing future appointments within next 90 days and meeting all other requirements   I discussed the assessment and treatment plan with the patient. The patient was  provided an opportunity to ask questions and all were answered. The patient agreed with the plan and demonstrated an understanding of the instructions.  Patient advised to call back or seek an in-person evaluation if the symptoms or condition worsens.  Total duration of non-face-to-face encounter: 56minutes.  Note by: Gillis Santa, MD Date: 08/27/2019; Time:  1:56 PM

## 2019-09-24 ENCOUNTER — Other Ambulatory Visit: Payer: Self-pay | Admitting: Family Medicine

## 2019-09-24 DIAGNOSIS — Z1231 Encounter for screening mammogram for malignant neoplasm of breast: Secondary | ICD-10-CM

## 2019-11-01 ENCOUNTER — Other Ambulatory Visit: Payer: Self-pay | Admitting: Student in an Organized Health Care Education/Training Program

## 2019-11-20 ENCOUNTER — Emergency Department: Payer: Medicare Other

## 2019-11-20 ENCOUNTER — Encounter: Payer: Self-pay | Admitting: *Deleted

## 2019-11-20 ENCOUNTER — Other Ambulatory Visit: Payer: Self-pay

## 2019-11-20 ENCOUNTER — Emergency Department
Admission: EM | Admit: 2019-11-20 | Discharge: 2019-11-21 | Disposition: A | Discharge status: Home / Self Care | Payer: Medicare Other | Attending: Emergency Medicine | Admitting: Emergency Medicine

## 2019-11-20 DIAGNOSIS — N3000 Acute cystitis without hematuria: Secondary | ICD-10-CM | POA: Diagnosis not present

## 2019-11-20 DIAGNOSIS — E10649 Type 1 diabetes mellitus with hypoglycemia without coma: Secondary | ICD-10-CM | POA: Diagnosis not present

## 2019-11-20 DIAGNOSIS — R4182 Altered mental status, unspecified: Secondary | ICD-10-CM | POA: Diagnosis present

## 2019-11-20 DIAGNOSIS — I10 Essential (primary) hypertension: Secondary | ICD-10-CM | POA: Insufficient documentation

## 2019-11-20 DIAGNOSIS — R0602 Shortness of breath: Secondary | ICD-10-CM | POA: Diagnosis not present

## 2019-11-20 DIAGNOSIS — Z1231 Encounter for screening mammogram for malignant neoplasm of breast: Secondary | ICD-10-CM | POA: Diagnosis not present

## 2019-11-20 DIAGNOSIS — E162 Hypoglycemia, unspecified: Secondary | ICD-10-CM

## 2019-11-20 DIAGNOSIS — E039 Hypothyroidism, unspecified: Secondary | ICD-10-CM | POA: Diagnosis not present

## 2019-11-20 LAB — CBC WITH DIFFERENTIAL/PLATELET
Abs Immature Granulocytes: 0.06 10*3/uL (ref 0.00–0.07)
Basophils Absolute: 0 10*3/uL (ref 0.0–0.1)
Basophils Relative: 0 %
Eosinophils Absolute: 0.2 10*3/uL (ref 0.0–0.5)
Eosinophils Relative: 1 %
HCT: 34.3 % — ABNORMAL LOW (ref 36.0–46.0)
Hemoglobin: 11 g/dL — ABNORMAL LOW (ref 12.0–15.0)
Immature Granulocytes: 0 %
Lymphocytes Relative: 8 %
Lymphs Abs: 1.1 10*3/uL (ref 0.7–4.0)
MCH: 31.3 pg (ref 26.0–34.0)
MCHC: 32.1 g/dL (ref 30.0–36.0)
MCV: 97.7 fL (ref 80.0–100.0)
Monocytes Absolute: 1.4 10*3/uL — ABNORMAL HIGH (ref 0.1–1.0)
Monocytes Relative: 10 %
Neutro Abs: 11.5 10*3/uL — ABNORMAL HIGH (ref 1.7–7.7)
Neutrophils Relative %: 81 %
Platelets: 211 10*3/uL (ref 150–400)
RBC: 3.51 MIL/uL — ABNORMAL LOW (ref 3.87–5.11)
RDW: 11.8 % (ref 11.5–15.5)
WBC: 14.3 10*3/uL — ABNORMAL HIGH (ref 4.0–10.5)
nRBC: 0 % (ref 0.0–0.2)

## 2019-11-20 LAB — URINALYSIS, COMPLETE (UACMP) WITH MICROSCOPIC
Bilirubin Urine: NEGATIVE
Glucose, UA: 150 mg/dL — AB
Hgb urine dipstick: NEGATIVE
Ketones, ur: NEGATIVE mg/dL
Nitrite: NEGATIVE
Protein, ur: NEGATIVE mg/dL
Specific Gravity, Urine: 1.02 (ref 1.005–1.030)
pH: 5 (ref 5.0–8.0)

## 2019-11-20 LAB — GLUCOSE, CAPILLARY
Glucose-Capillary: 45 mg/dL — ABNORMAL LOW (ref 70–99)
Glucose-Capillary: 161 mg/dL — ABNORMAL HIGH (ref 70–99)

## 2019-11-20 MED ORDER — SODIUM CHLORIDE 0.9 % IV SOLN
1.0000 g | Freq: Once | INTRAVENOUS | Status: AC
  Administered 2019-11-20: 1 g via INTRAVENOUS
  Filled 2019-11-20: qty 10

## 2019-11-20 MED ORDER — DEXTROSE 50 % IV SOLN
25.0000 g | Freq: Once | INTRAVENOUS | Status: AC
  Administered 2019-11-20: 25 g via INTRAVENOUS

## 2019-11-20 NOTE — ED Provider Notes (Signed)
[facility] Emergency Department Provider Note       Time seen: ----------------------------------------- 9:58 PM on 11/20/2019 -----------------------------------------  I have reviewed the triage vital signs and the nursing notes. HISTORY   Chief Complaint No chief complaint on file.   HPI Erin Daniels is a 74 y.o. female with a history of type 1 diabetes, diabetic neuropathy, gastroparesis, hyperlipidemia, hypertension, seizures, CVA who presents to the ED for altered mental status.  Patient reportedly was having strokelike symptoms and the husband called EMS.  Blood sugar has been labile according to EMS.  She is also had some shortness of breath and was noted to be hypoxic in the prehospital setting.  She arrives on 4 L nasal cannula oxygen.  Patient states her blood sugar had dropped.  Past Medical History:  Diagnosis Date  . At high risk for falls   . Cancer ([place])   . Complication of anesthesia    difficult to arouse  . Diabetes mellitus without complication (HCC)    Type I  . Diabetic neuropathy ([place])   . Diabetic retinopathy ([place])   . Dizziness   . Gastroparesis   . Herniated disc, cervical   . Hypercholesteremia   . Hypertension   . Hypotensive episode   . Hypothyroidism   . Insulin pump in place   . Myofascial muscle pain   . Numbness of arm    left  . Osteoporosis   . Poor vision   . Seizures ([place])   . Slurred speech   . Stroke (cerebrum) ([place])    4/19  . Stroke (HCC)    x5  . Vertigo   . Wears glasses     Patient Active Problem List   Diagnosis Date Noted  . Pressure injury of skin 08/12/2019  . Acute metabolic encephalopathy [id number]  . HLD (hyperlipidemia) 08/11/2019  . Stroke ([place]) 08/11/2019  . Iron deficiency anemia 08/11/2019  . Subarachnoid hematoma ([place]) 04/30/2019  . Intertrochanteric fracture of right hip ([place]) 12/08/2018  . Hypercoagulopathy ([place]) 09/29/2018  . Acute anxiety 09/29/2018  .  Hypertension associated with type 1 diabetes mellitus ([place]) 09/19/2018  . Dyslipidemia due to type 1 diabetes mellitus ([place]) 09/19/2018  . Anemia of chronic disease 09/19/2018  . Chronic migraine without aura 09/19/2018  . Chronic non-seasonal allergic rhinitis 09/19/2018  . Major depression, recurrent, chronic ([place]) 09/19/2018  . Diabetic gastroparesis associated with type 1 diabetes mellitus ([place]) 09/19/2018  . Bilateral sacral insufficiency fracture 09/19/2018  . Constipation 09/15/2018  . Acute urinary retention 09/15/2018  . Intractable pain 09/15/2018  . Chronic cerebrovascular accident (CVA) 05/28/2018  . Diabetic polyneuropathy associated with type 1 diabetes mellitus ([place]) 12/10/2017  . Numbness and tingling of both legs 12/10/2017  . Chronic pain syndrome 12/10/2017  . DKA (diabetic ketoacidoses) ([place]) 11/12/2017  . Confusion 11/11/2017  . Hypoglycemia 11/26/2016  . Lactic acid acidosis   . Sepsis ([place])   . 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 Essential hypertension   . Depression   . Right radial fracture 05/29/2015    Past Surgical History:  Procedure Laterality Date  . APPENDECTOMY    . AUGMENTATION MAMMAPLASTY Bilateral 2011  . BILATERAL CARPAL TUNNEL RELEASE    . COLONOSCOPY    . EYE SURGERY Bilateral    cataract  . HEMORROIDECTOMY    . HERNIA REPAIR     umbilical  . INTRAMEDULLARY (IM) NAIL INTERTROCHANTERIC Right 12/07/2018   Procedure: INTRAMEDULLARY (IM)  NAIL INTERTROCHANTRIC;  Surgeon: Thornton Park, MD;  Location: [facility] ORS;  Service: Orthopedics;  Laterality: Right;  . JOINT REPLACEMENT     right hip  . OPEN REDUCTION INTERNAL FIXATION (ORIF) DISTAL RADIAL FRACTURE Right 05/29/2015   Procedure: OPEN REDUCTION INTERNAL FIXATION (ORIF) RIGHT DISTAL RADIUS FRACTURE WITH ALLOGRAFT BONE GRAFTING FOR REPAIR AND RECONSTRUCTION;  Surgeon: Roseanne Kaufman, MD;  Location: [place];  Service: Orthopedics;  Laterality: Right;   . OPEN REDUCTION INTERNAL FIXATION (ORIF) METACARPAL Right 01/09/2019   Procedure: OPEN REDUCTION INTERNAL FIXATION (ORIF) RIGHT SECOND METACARPAL FRACTURE;  Surgeon: Corky Mull, MD;  Location: [facility] ORS;  Service: Orthopedics;  Laterality: Right;  . SHOULDER ARTHROSCOPY Left   . SHOULDER HEMI-ARTHROPLASTY Left   . SYNOVECTOMY    . TONSILLECTOMY    . TRIGGER FINGER RELEASE    . TUBAL LIGATION      Allergies Carbocaine [mepivacaine hcl], Hydrocodone, Tussionex pennkinetic er [hydrocod polst-cpm polst er], Baclofen, Codeine, Meperidine, Metoclopramide hcl, and Stadol [butorphanol]  Social History Social History   Tobacco Use  . Smoking status: Never Smoker  . Smokeless tobacco: Never Used  Substance Use Topics  . Alcohol use: No  . Drug use: No    Review of Systems Constitutional: Negative for fever. Cardiovascular: Negative for chest pain. Respiratory: Negative for shortness of breath. Gastrointestinal: Negative for abdominal pain, vomiting and diarrhea. Musculoskeletal: Negative for back pain. Skin: Negative for rash. Neurological: Negative for headaches, focal weakness or numbness.  Positive for recent altered mental status  All systems negative/normal/unremarkable except as stated in the HPI  ____________________________________________   PHYSICAL EXAM:  VITAL SIGNS: ED Triage Vitals  Enc Vitals Group     BP      Pulse      Resp      Temp      Temp src      SpO2      Weight      Height      Head Circumference      Peak Flow      Pain Score      Pain Loc      Pain Edu?      Excl. in [place]?     Constitutional: Alert and oriented.  No acute distress Eyes: Conjunctivae are normal. Normal extraocular movements. ENT      Head: Normocephalic and atraumatic.      Nose: No congestion/rhinnorhea.      Mouth/Throat: Mucous membranes are moist.      Neck: No stridor. Cardiovascular: Normal rate, regular rhythm. No murmurs, rubs, or gallops. Respiratory: Normal  respiratory effort without tachypnea nor retractions. Breath sounds are clear and equal bilaterally. No wheezes/rales/rhonchi. Gastrointestinal: Soft and nontender. Normal bowel sounds Musculoskeletal: Nontender with normal range of motion in extremities. No lower extremity tenderness nor edema. Neurologic:  Normal speech and language. No gross focal neurologic deficits are appreciated.  Skin:  Skin is warm, dry and intact. No rash noted. Psychiatric: Mood and affect are normal. Speech and behavior are normal.  ____________________________________________  EKG: Interpreted by me.  Sinus rhythm with rate of 67 bpm, likely left bundle branch block, normal QT  ____________________________________________  ED COURSE:  As part of my medical decision making, I reviewed the following data within the [place] History obtained from family if available, nursing notes, old chart and ekg, as well as notes from prior ED visits. Patient presented for altered mental status, we will assess with labs and imaging as indicated at this  time.   Procedures  WYONNA FINCANNON was evaluated in Emergency Department on 11/20/2019 for the symptoms described in the history of present illness. She was evaluated in the context of the global COVID-19 pandemic, which necessitated consideration that the patient might be at risk for infection with the SARS-CoV-2 virus that causes COVID-19. Institutional protocols and algorithms that pertain to the evaluation of patients at risk for COVID-19 are in a state of rapid change based on information released by regulatory bodies including the CDC and federal and state organizations. These policies and algorithms were followed during the patient's care in the ED.  ____________________________________________   LABS (pertinent positives/negatives)  Labs Reviewed  CBC WITH DIFFERENTIAL/PLATELET - Abnormal; Notable for the following components:      Result Value   WBC  14.3 (*)    RBC 3.51 (*)    Hemoglobin 11.0 (*)    HCT 34.3 (*)    Neutro Abs 11.5 (*)    Monocytes Absolute 1.4 (*)    All other components within normal limits  URINALYSIS, COMPLETE (UACMP) WITH MICROSCOPIC - Abnormal; Notable for the following components:   Color, Urine AMBER (*)    APPearance HAZY (*)    Glucose, UA 150 (*)    Leukocytes,Ua SMALL (*)    Bacteria, UA MANY (*)    All other components within normal limits  GLUCOSE, CAPILLARY - Abnormal; Notable for the following components:   Glucose-Capillary 45 (*)    All other components within normal limits  GLUCOSE, CAPILLARY - Abnormal; Notable for the following components:   Glucose-Capillary 161 (*)    All other components within normal limits  URINE CULTURE  COMPREHENSIVE METABOLIC PANEL  CBG MONITORING, ED  TROPONIN I (HIGH SENSITIVITY)    RADIOLOGY Images were viewed by me  Chest x-ray IMPRESSION: 1. Displaced fractures of the anterolateral ninth and tenth ribs on the left. These may represent unhealed fractures related to the patient's prior trauma December. However, they appear more displaced on today's study. 2. Otherwise, no acute cardiopulmonary process. ____________________________________________   DIFFERENTIAL DIAGNOSIS   Hypoglycemia, dehydration, electrolyte abnormality, occult infection, CVA unlikely  FINAL ASSESSMENT AND PLAN  Altered mental status, hypoglycemia, UTI   Plan: The patient had presented for altered mental status. Patient's labs did reveal leukocytosis and likely urinary tract infection with initial hypoglycemia and a blood sugar of 45. Patient's imaging did reveal old rib fractures, husband states she has not fallen since December.  She received D50 on arrival here for hypoglycemia and IV Rocephin for UTI.   Laurence Aly, MD    Note: This note was generated in part or whole with voice recognition software. Voice recognition is usually quite accurate but there are  transcription errors that can and very often do occur. I apologize for any typographical errors that were not detected and corrected.     Earleen Newport, MD 11/20/19 [phone]

## 2019-11-20 NOTE — ED Triage Notes (Signed)
Husband reports pt lethargic and decreased LOC @ home. Pt has PMH of DM 1 and uses an insulin pump. Pump has been suspended by husband at this time. Pt states change in behavior and mentation at approximately 2000 tonight, husband reports seeing wife eatuing glucose tablets w/o improvement. EMS reports widely fluctuating CBG after D50 administration PTA.

## 2019-11-21 ENCOUNTER — Ambulatory Visit
Admission: RE | Admit: 2019-11-21 | Discharge: 2019-11-21 | Disposition: A | Discharge status: Home / Self Care | Payer: Medicare Other | Source: Ambulatory Visit | Attending: Family Medicine | Admitting: Family Medicine

## 2019-11-21 DIAGNOSIS — N3000 Acute cystitis without hematuria: Secondary | ICD-10-CM | POA: Diagnosis not present

## 2019-11-21 DIAGNOSIS — Z1231 Encounter for screening mammogram for malignant neoplasm of breast: Secondary | ICD-10-CM

## 2019-11-21 LAB — TROPONIN I (HIGH SENSITIVITY): Troponin I (High Sensitivity): 5 ng/L (ref <18)

## 2019-11-21 LAB — C DIFFICILE QUICK SCREEN W PCR REFLEX
C Diff antigen: NEGATIVE
C Diff interpretation: NOT DETECTED
C Diff toxin: NEGATIVE

## 2019-11-21 LAB — GLUCOSE, CAPILLARY
Glucose-Capillary: 227 mg/dL — ABNORMAL HIGH (ref 70–99)
Glucose-Capillary: 227 mg/dL — ABNORMAL HIGH (ref 70–99)
Glucose-Capillary: 312 mg/dL — ABNORMAL HIGH (ref 70–99)

## 2019-11-21 LAB — COMPREHENSIVE METABOLIC PANEL
ALT: 23 U/L (ref 0–44)
AST: 31 U/L (ref 15–41)
Albumin: 4.6 g/dL (ref 3.5–5.0)
Alkaline Phosphatase: 70 U/L (ref 38–126)
Anion gap: 13 (ref 5–15)
BUN: 48 mg/dL — ABNORMAL HIGH (ref 8–23)
CO2: 25 mmol/L (ref 22–32)
Calcium: 10.1 mg/dL (ref 8.9–10.3)
Chloride: 98 mmol/L (ref 98–111)
Creatinine, Ser: 1.13 mg/dL — ABNORMAL HIGH (ref 0.44–1.00)
GFR calc Af Amer: 55 mL/min — ABNORMAL LOW (ref >60)
GFR calc non Af Amer: 48 mL/min — ABNORMAL LOW (ref >60)
Glucose, Bld: 273 mg/dL — ABNORMAL HIGH (ref 70–99)
Potassium: 4.3 mmol/L (ref 3.5–5.1)
Sodium: 136 mmol/L (ref 135–145)
Total Bilirubin: 0.6 mg/dL (ref 0.3–1.2)
Total Protein: 8 g/dL (ref 6.5–8.1)

## 2019-11-21 MED ORDER — LOPERAMIDE HCL 2 MG PO CAPS
4.0000 mg | ORAL_CAPSULE | Freq: Once | ORAL | Status: AC
  Administered 2019-11-21: 4 mg via ORAL
  Filled 2019-11-21: qty 2

## 2019-11-21 MED ORDER — CEPHALEXIN 500 MG PO CAPS: 500.0000 mg | ORAL_CAPSULE | Freq: Two times a day (BID) | ORAL | 0 refills | Status: AC

## 2019-11-21 MED ORDER — FOSFOMYCIN TROMETHAMINE 3 G PO PACK
3.0000 g | PACK | Freq: Once | ORAL | Status: AC
  Administered 2019-11-21: 3 g via ORAL
  Filled 2019-11-21: qty 3

## 2019-11-21 NOTE — ED Notes (Addendum)
PT assisted to restroom with runny BM noted. Assisted with peri care

## 2019-11-21 NOTE — ED Notes (Signed)
Pt assisted to restroom, standby assist.  Pt with runny BM noted.  Dr. Owens Shark notified, stool sample collected

## 2019-11-23 LAB — URINE CULTURE: Culture: >=100000 — AB

## 2020-03-15 ENCOUNTER — Other Ambulatory Visit: Payer: Self-pay

## 2020-03-15 ENCOUNTER — Other Ambulatory Visit
Admission: RE | Admit: 2020-03-15 | Discharge: 2020-03-15 | Disposition: A | Discharge status: Home / Self Care | Payer: Medicare Other | Source: Ambulatory Visit | Attending: Internal Medicine | Admitting: Internal Medicine

## 2020-03-15 DIAGNOSIS — Z20822 Contact with and (suspected) exposure to covid-19: Secondary | ICD-10-CM | POA: Diagnosis not present

## 2020-03-15 DIAGNOSIS — Z01812 Encounter for preprocedural laboratory examination: Secondary | ICD-10-CM | POA: Diagnosis present

## 2020-03-16 ENCOUNTER — Encounter: Payer: Self-pay | Admitting: Internal Medicine

## 2020-03-16 LAB — SARS CORONAVIRUS 2 (TAT 6-24 HRS): SARS Coronavirus 2: NEGATIVE

## 2020-03-17 ENCOUNTER — Ambulatory Visit: Payer: Medicare Other | Admitting: Anesthesiology

## 2020-03-17 ENCOUNTER — Ambulatory Visit
Admission: RE | Admit: 2020-03-17 | Discharge: 2020-03-17 | Disposition: A | Discharge status: Home / Self Care | Payer: Medicare Other | Attending: Internal Medicine | Admitting: Internal Medicine

## 2020-03-17 ENCOUNTER — Encounter
Admission: RE | Disposition: A | Discharge status: Home / Self Care | Payer: Self-pay | Source: Home / Self Care | Attending: Internal Medicine

## 2020-03-17 ENCOUNTER — Encounter: Payer: Self-pay | Admitting: Internal Medicine

## 2020-03-17 ENCOUNTER — Other Ambulatory Visit: Payer: Self-pay

## 2020-03-17 DIAGNOSIS — K3184 Gastroparesis: Secondary | ICD-10-CM | POA: Diagnosis not present

## 2020-03-17 DIAGNOSIS — E1043 Type 1 diabetes mellitus with diabetic autonomic (poly)neuropathy: Secondary | ICD-10-CM | POA: Diagnosis not present

## 2020-03-17 DIAGNOSIS — Z7982 Long term (current) use of aspirin: Secondary | ICD-10-CM | POA: Insufficient documentation

## 2020-03-17 DIAGNOSIS — Z885 Allergy status to narcotic agent status: Secondary | ICD-10-CM | POA: Diagnosis not present

## 2020-03-17 DIAGNOSIS — Z8673 Personal history of transient ischemic attack (TIA), and cerebral infarction without residual deficits: Secondary | ICD-10-CM | POA: Insufficient documentation

## 2020-03-17 DIAGNOSIS — Z79899 Other long term (current) drug therapy: Secondary | ICD-10-CM | POA: Diagnosis not present

## 2020-03-17 DIAGNOSIS — K5730 Diverticulosis of large intestine without perforation or abscess without bleeding: Secondary | ICD-10-CM | POA: Insufficient documentation

## 2020-03-17 DIAGNOSIS — E1040 Type 1 diabetes mellitus with diabetic neuropathy, unspecified: Secondary | ICD-10-CM | POA: Insufficient documentation

## 2020-03-17 DIAGNOSIS — Z8371 Family history of colonic polyps: Secondary | ICD-10-CM | POA: Insufficient documentation

## 2020-03-17 DIAGNOSIS — E7800 Pure hypercholesterolemia, unspecified: Secondary | ICD-10-CM | POA: Insufficient documentation

## 2020-03-17 DIAGNOSIS — Z888 Allergy status to other drugs, medicaments and biological substances status: Secondary | ICD-10-CM | POA: Insufficient documentation

## 2020-03-17 DIAGNOSIS — E10319 Type 1 diabetes mellitus with unspecified diabetic retinopathy without macular edema: Secondary | ICD-10-CM | POA: Insufficient documentation

## 2020-03-17 DIAGNOSIS — Z794 Long term (current) use of insulin: Secondary | ICD-10-CM | POA: Diagnosis not present

## 2020-03-17 DIAGNOSIS — I10 Essential (primary) hypertension: Secondary | ICD-10-CM | POA: Diagnosis not present

## 2020-03-17 DIAGNOSIS — Z1211 Encounter for screening for malignant neoplasm of colon: Secondary | ICD-10-CM | POA: Insufficient documentation

## 2020-03-17 HISTORY — DX: Angina pectoris, unspecified: I20.9

## 2020-03-17 HISTORY — DX: Headache, unspecified: R51.9

## 2020-03-17 HISTORY — DX: Carpal tunnel syndrome, unspecified upper limb: G56.00

## 2020-03-17 HISTORY — DX: Cervicalgia: M54.2

## 2020-03-17 HISTORY — DX: Hypokalemia: E87.6

## 2020-03-17 HISTORY — DX: Rosacea, unspecified: L71.9

## 2020-03-17 LAB — GLUCOSE, CAPILLARY
Glucose-Capillary: 313 mg/dL — ABNORMAL HIGH (ref 70–99)
Glucose-Capillary: 209 mg/dL — ABNORMAL HIGH (ref 70–99)

## 2020-03-17 SURGERY — COLONOSCOPY WITH PROPOFOL
Anesthesia: General

## 2020-03-17 MED ORDER — PHENYLEPHRINE HCL (PRESSORS) 10 MG/ML IV SOLN
INTRAVENOUS | Status: DC | PRN
  Administered 2020-03-17: 100 ug via INTRAVENOUS
  Administered 2020-03-17: 50 ug via INTRAVENOUS

## 2020-03-17 MED ORDER — PROPOFOL 500 MG/50ML IV EMUL
INTRAVENOUS | Status: DC | PRN
  Administered 2020-03-17: 100 ug/kg/min via INTRAVENOUS

## 2020-03-17 MED ORDER — INSULIN ASPART 100 UNIT/ML ~~LOC~~ SOLN
5.0000 [IU] | Freq: Once | SUBCUTANEOUS | Status: AC
  Administered 2020-03-17: 5 [IU] via SUBCUTANEOUS

## 2020-03-17 MED ORDER — EPHEDRINE SULFATE 50 MG/ML IJ SOLN
INTRAMUSCULAR | Status: DC | PRN
  Administered 2020-03-17 (×2): 5 mg via INTRAVENOUS

## 2020-03-17 MED ORDER — SODIUM CHLORIDE 0.9 % IV SOLN: INTRAVENOUS | Status: DC

## 2020-03-17 MED ORDER — PROPOFOL 500 MG/50ML IV EMUL
INTRAVENOUS | Status: AC
  Filled 2020-03-17: qty 50

## 2020-03-17 MED ORDER — INSULIN ASPART 100 UNIT/ML ~~LOC~~ SOLN
SUBCUTANEOUS | Status: AC
  Filled 2020-03-17: qty 1

## 2020-03-17 MED ORDER — INSULIN REGULAR HUMAN 100 UNIT/ML IJ SOLN: 5.0000 [IU] | Freq: Once | INTRAMUSCULAR | Status: DC

## 2020-03-17 NOTE — Anesthesia Postprocedure Evaluation (Signed)
Anesthesia Post Note  Patient: Mathilda Maguire Harkey  Procedure(s) Performed: COLONOSCOPY WITH PROPOFOL (N/A )  Patient location during evaluation: Endoscopy Anesthesia Type: General Level of consciousness: awake and alert Pain management: pain level controlled Vital Signs Assessment: post-procedure vital signs reviewed and stable Respiratory status: spontaneous breathing, nonlabored ventilation, respiratory function stable and patient connected to nasal cannula oxygen Cardiovascular status: blood pressure returned to baseline and stable Postop Assessment: no apparent nausea or vomiting Anesthetic complications: no   No complications documented.   Last Vitals:  Vitals:   03/17/20 1005 03/17/20 1015  BP: (!) 88/61 (!) 86/51  Pulse:    Resp: 17 14  Temp:    SpO2:      Last Pain:  Vitals:   03/17/20 1015  TempSrc:   PainSc: 0-No pain                 Martha Clan

## 2020-03-17 NOTE — Transfer of Care (Signed)
Immediate Anesthesia Transfer of Care Note  Patient: Erin Daniels  Procedure(s) Performed: COLONOSCOPY WITH PROPOFOL (N/A )  Patient Location: PACU  Anesthesia Type:General  Level of Consciousness: awake and sedated  Airway & Oxygen Therapy: Patient Spontanous Breathing and Patient connected to nasal cannula oxygen  Post-op Assessment: Report given to RN and Post -op Vital signs reviewed and stable  Post vital signs: Reviewed and stable  Last Vitals:  Vitals Value Taken Time  BP    Temp    Pulse    Resp    SpO2      Last Pain:  Vitals:   03/17/20 0809  TempSrc: Temporal         Complications: No complications documented.

## 2020-03-17 NOTE — Anesthesia Preprocedure Evaluation (Signed)
Anesthesia Evaluation  Patient identified by MRN, date of birth, ID band Patient awake    Reviewed: Allergy & Precautions, H&P , NPO status , Patient's Chart, lab work & pertinent test results, reviewed documented beta blocker date and time   History of Anesthesia Complications (+) history of anesthetic complications  Airway Mallampati: II   Neck ROM: full    Dental  (+) Poor Dentition   Pulmonary neg pulmonary ROS,    Pulmonary exam normal        Cardiovascular hypertension, On Medications + angina with exertion Normal cardiovascular exam Rhythm:regular Rate:Normal     Neuro/Psych  Headaches, Seizures -, Well Controlled,  PSYCHIATRIC DISORDERS Anxiety Depression  Neuromuscular disease CVA, No Residual Symptoms    GI/Hepatic negative GI ROS, Neg liver ROS,   Endo/Other  diabetes, Poorly Controlled, Type 1, Insulin DependentHypothyroidism   Renal/GU negative Renal ROS  negative genitourinary   Musculoskeletal   Abdominal   Peds  Hematology  (+) Blood dyscrasia, anemia ,   Anesthesia Other Findings Past Medical History: No date: Angina pectoris (HCC) No date: At high risk for falls No date: Cancer ([place]) No date: Carpal tunnel syndrome     Comment:  BILATERAL No date: Cervicalgia No date: Complication of anesthesia     Comment:  difficult to arouse No date: Diabetes mellitus without complication (HCC)     Comment:  Type I No date: Diabetic neuropathy (HCC) No date: Diabetic neuropathy (HCC) No date: Diabetic retinopathy ([place]) No date: Diabetic retinopathy ([place]) No date: Dizziness No date: Gastroparesis No date: Headache     Comment:  MIGRAINE No date: Herniated disc, cervical No date: Hypercholesteremia No date: Hypertension No date: Hypokalemia No date: Hypotensive episode No date: Hypothyroidism No date: Insulin pump in place No date: Myofascial muscle pain No date: Numbness of arm     Comment:   left No date: Osteoporosis No date: Poor vision No date: Rosacea No date: Seizures ([place]) No date: Slurred speech No date: Stroke (cerebrum) ([place])     Comment:  4/19 No date: Stroke [facility])     Comment:  x5 No date: Vertigo No date: Wears glasses Past Surgical History: No date: APPENDECTOMY 2011: AUGMENTATION MAMMAPLASTY; Bilateral No date: BILATERAL CARPAL TUNNEL RELEASE No date: BITRECTOMY     Comment:  MEMBRAIN PEEL EYE No date: COLONOSCOPY No date: EYE SURGERY; Bilateral     Comment:  cataract No date: FRACTURE SURGERY No date: HEMORROIDECTOMY No date: HERNIA REPAIR     Comment:  umbilical 02/04/736: INTRAMEDULLARY (IM) NAIL INTERTROCHANTERIC; Right     Comment:  Procedure: INTRAMEDULLARY (IM) NAIL INTERTROCHANTRIC;                Surgeon: Thornton Park, MD;  Location: [facility] ORS;                Service: Orthopedics;  Laterality: Right; No date: JOINT REPLACEMENT     Comment:  right hip 05/29/2015: OPEN REDUCTION INTERNAL FIXATION (ORIF) DISTAL RADIAL  FRACTURE; Right     Comment:  Procedure: OPEN REDUCTION INTERNAL FIXATION (ORIF) RIGHT              DISTAL RADIUS FRACTURE WITH ALLOGRAFT BONE GRAFTING FOR               REPAIR AND RECONSTRUCTION;  Surgeon: Roseanne Kaufman, MD;               Location: [place];  Service: Orthopedics;  Laterality:  Right; 01/09/2019: OPEN REDUCTION INTERNAL FIXATION (ORIF) METACARPAL; Right     Comment:  Procedure: OPEN REDUCTION INTERNAL FIXATION (ORIF) RIGHT              SECOND METACARPAL FRACTURE;  Surgeon: Corky Mull, MD;               Location: [facility] ORS;  Service: Orthopedics;  Laterality:               Right; No date: SHOULDER ARTHROSCOPY; Left No date: SHOULDER HEMI-ARTHROPLASTY; Left No date: SYNOVECTOMY No date: TONSILLECTOMY No date: TRIGGER FINGER RELEASE No date: TUBAL LIGATION BMI    Body Mass Index: 19.84 kg/m     Reproductive/Obstetrics negative OB ROS                              Anesthesia Physical Anesthesia Plan  ASA: III  Anesthesia Plan: General   Post-op Pain Management:    Induction:   PONV Risk Score and Plan:   Airway Management Planned:   Additional Equipment:   Intra-op Plan:   Post-operative Plan:   Informed Consent: I have reviewed the patients History and Physical, chart, labs and discussed the procedure including the risks, benefits and alternatives for the proposed anesthesia with the patient or authorized representative who has indicated his/her understanding and acceptance.     Dental Advisory Given  Plan Discussed with: CRNA  Anesthesia Plan Comments:         Anesthesia Quick Evaluation

## 2020-03-17 NOTE — Op Note (Signed)
[facility] Gastroenterology Patient Name: Erin Daniels Procedure Date: 03/17/2020 8:48 AM MRN: [id number] Account #: [id number] Date of Birth: 31-Mar-1946 Admit Type: Outpatient Age: 74 Room: [facility] ENDO ROOM 3 Gender: Female Note Status: Finalized Procedure:             Colonoscopy Indications:           Colon cancer screening in patient at increased risk:                         Family history of 1st-degree relative with colon polyps Providers:             Benay Pike. Uzoma Vivona MD, MD Medicines:             Propofol per Anesthesia Complications:         No immediate complications. Procedure:             Pre-Anesthesia Assessment:                        - The risks and benefits of the procedure and the                         sedation options and risks were discussed with the                         patient. All questions were answered and informed                         consent was obtained.                        - Patient identification and proposed procedure were                         verified prior to the procedure by the nurse. The                         procedure was verified in the procedure room.                        - ASA Grade Assessment: III - A patient with severe                         systemic disease.                        - After reviewing the risks and benefits, the patient                         was deemed in satisfactory condition to undergo the                         procedure.                        After obtaining informed consent, the colonoscope was                         passed under direct vision. Throughout the procedure,  the patient's blood pressure, pulse, and oxygen                         saturations were monitored continuously. The                         Colonoscope was introduced through the anus and                         advanced to the the cecum, identified by appendiceal                          orifice and ileocecal valve. The colonoscopy was                         somewhat difficult due to poor endoscopic                         visualization. Successful completion of the procedure                         was aided by lavage. The patient tolerated the                         procedure well. The quality of the bowel preparation                         was adequate to identify polyps 6 mm and larger in                         size. The ileocecal valve, appendiceal orifice, and                         rectum were photographed. Findings:      The perianal and digital rectal examinations were normal. Pertinent       negatives include normal sphincter tone and no palpable rectal lesions.      A few small-mouthed diverticula were found in the sigmoid colon.      Normal mucosa was found in the entire colon.      The exam was otherwise without abnormality on direct and retroflexion       views. Impression:            - Diverticulosis in the sigmoid colon.                        - Normal mucosa in the entire examined colon.                        - The examination was otherwise normal on direct and                         retroflexion views.                        - No specimens collected. Recommendation:        - Patient has a contact number available for  emergencies. The signs and symptoms of potential                         delayed complications were discussed with the patient.                         Return to normal activities tomorrow. Written                         discharge instructions were provided to the patient.                        - Resume previous diet.                        - Continue present medications.                        - No repeat colonoscopy due to current age (50 years                         or older) and the absence of colonic polyps.                        - You do NOT require further colon cancer screening                          measures (Annual stool testing (i.e. hemoccult, FIT,                         cologuard), sigmoidoscopy, colonoscopy or CT                         colonography). You should share this recommendation                         with your Primary Care provider.                        - Return to GI office PRN.                        - The findings and recommendations were discussed with                         the patient. Procedure Code(s):     --- Professional ---                        G4010, Colorectal cancer screening; colonoscopy on                         individual at high risk Diagnosis Code(s):     --- Professional ---                        K57.30, Diverticulosis of large intestine without                         perforation or abscess without bleeding  Z83.71, Family history of colonic polyps CPT copyright 2019 American Medical Association. All rights reserved. The codes documented in this report are preliminary and upon coder review may  be revised to meet current compliance requirements. Efrain Sella MD, MD 03/17/2020 9:24:48 AM This report has been signed electronically. Number of Addenda: 0 Note Initiated On: 03/17/2020 8:48 AM Scope Withdrawal Time: 0 hours 9 minutes 0 seconds  Total Procedure Duration: 0 hours 18 minutes 16 seconds  Estimated Blood Loss:  Estimated blood loss: none.      [facility]

## 2020-03-17 NOTE — Interval H&P Note (Signed)
History and Physical Interval Note:  03/17/2020 8:56 AM  Erin Daniels  has presented today for surgery, with the diagnosis of SCREENING.  The various methods of treatment have been discussed with the patient and family. After consideration of risks, benefits and other options for treatment, the patient has consented to  Procedure(s): COLONOSCOPY WITH PROPOFOL (N/A) as a surgical intervention.  The patient's history has been reviewed, patient examined, no change in status, stable for surgery.  I have reviewed the patient's chart and labs.  Questions were answered to the patient's satisfaction.     [place], [place]

## 2020-03-17 NOTE — H&P (Signed)
Outpatient short stay form Pre-procedure 03/17/2020 8:56 AM Keelan Tripodi K. Alice Reichert, M.D.  Primary Physician: Juluis Pitch, M.D.  Reason for visit:  Family history of colon polyps  History of present illness: Patient is a1 y/o female with a family history of colon polyps. Patient denies change in bowel habits, rectal bleeding, weight loss or abdominal pain.      Current Facility-Administered Medications:  .  0.9 %  sodium chloride infusion, , Intravenous, Continuous, Taylynn Easton K, MD .  insulin aspart (novoLOG) 100 UNIT/ML injection, , , ,   Medications Prior to Admission  Medication Sig Dispense Refill Last Dose  . aspirin EC 81 MG tablet Take 81 mg by mouth daily.   03/17/2020 at 0630  . atorvastatin (LIPITOR) 40 MG tablet Take 1 tablet (40 mg total) by mouth daily. (Patient taking differently: Take 40 mg by mouth daily at 6 PM. ) 30 tablet 0 03/17/2020 at 0630  . gabapentin (NEURONTIN) 300 MG capsule 300 mg in the morning, 300 mg in the afternoon, 900 mg in the evening (Patient taking differently: Take 300-900 mg by mouth See admin instructions. Take 1 capsule (300mg ) by mouth every morning, 1 capsule (300mg ) by mouth at lunch and take 3 capsules (900mg ) by mouth every night at bedtime) 150 capsule 0 03/17/2020 at 0630  . losartan (COZAAR) 50 MG tablet Take 50 mg by mouth daily.   03/17/2020 at 0630  . senna (SENOKOT) 8.6 MG tablet Take 1 tablet by mouth daily.     Marland Kitchen acetaminophen (TYLENOL) 325 MG tablet Take 2 tablets (650 mg total) by mouth every 6 (six) hours as needed for headache. For mild pain 30 tablet 0   . brimonidine-timolol (COMBIGAN) 0.2-0.5 % ophthalmic solution Place 1 drop into the right eye 3 (three) times daily. Instill one drop in right eye morning, noon and bedtime      . butalbital-acetaminophen-caffeine (FIORICET) 50-325-40 MG tablet Take 1 tablet by mouth 2 (two) times daily as needed for headache or migraine. 180 tablet 2   . Calcium Carb-Cholecalciferol (CALCIUM  600 + D) 600-200 MG-UNIT TABS Take 1 tablet by mouth daily with supper.      . cetirizine (ZYRTEC) 10 MG tablet Take 1 tablet (10 mg total) by mouth daily. 30 tablet 0   . Cholecalciferol (VITAMIN D-3) 5000 units TABS Take 5,000 Units by mouth daily.     . clopidogrel (PLAVIX) 75 MG tablet Take 75 mg by mouth daily with supper.   03/13/2020  . dorzolamide-timolol (COSOPT) 22.3-6.8 MG/ML ophthalmic solution Place 1 drop into both eyes daily.     . ferrous sulfate 325 (65 FE) MG tablet Take 325 mg by mouth daily with supper.      Marland Kitchen GLUCAGON EMERGENCY 1 MG injection Inject 1 mg into the muscle once.     Marland Kitchen glucose 4 GM chewable tablet Chew 1 tablet (4 g total) by mouth as needed for low blood sugar. <65 50 tablet 0   . insulin aspart (NOVOLOG) 100 UNIT/ML injection Inject into the skin continuous. (insulin pump averages about 20u over 24 hours)     . lidocaine (LIDODERM) 5 % Place 1 patch onto the skin daily. Remove & Discard patch within 12 hours or as directed by MD 30 patch 0   . Multiple Vitamins-Minerals (CEROVITE ADVANCED FORMULA PO) Take 1 tablet by mouth daily.     . Netarsudil-Latanoprost (ROCKLATAN) 0.02-0.005 % SOLN Place 1 drop into the right eye at bedtime.      Marland Kitchen  olmesartan (BENICAR) 20 MG tablet Take 20 mg by mouth every evening.      Marland Kitchen oxyCODONE-acetaminophen (PERCOCET) 5-325 MG tablet Take 0.5 tablets by mouth every 8 (eight) hours as needed. (Patient not taking: Reported on 08/26/2019) 6 tablet 0   . Polyethyl Glycol-Propyl Glycol (SYSTANE ULTRA) 0.4-0.3 % SOLN Place 1-2 drops into both eyes daily as needed (dry eyes).     . polyethylene glycol (MIRALAX / GLYCOLAX) packet Take 17 g by mouth every other day.      . promethazine (PHENERGAN) 25 MG suppository Place 25 mg rectally every 6 (six) hours as needed for nausea or vomiting.     . venlafaxine (EFFEXOR) 75 MG tablet Take 1 tablet (75 mg total) by mouth 2 (two) times daily. 60 tablet 0   . vitamin C (ASCORBIC ACID) 500 MG tablet Take  500 mg by mouth 2 (two) times daily.         Allergies  Allergen Reactions  . Carbocaine [Mepivacaine Hcl] Palpitations    Low bp  . Hydrocodone Other (See Comments)    Diabetic/  High sugars  . Tussionex Pennkinetic Er [Hydrocod Polst-Cpm Polst Er] Other (See Comments)    Diabetic/  High sugars  . Baclofen     confusion  . Codeine Nausea Only and Other (See Comments)    Reaction: stress attacks/ headache and nausea Can take Vicodin and Percocet   . Meperidine Nausea Only and Other (See Comments)    Reaction: stress attacks/ headache and nausea  . Metoclopramide Hcl Nausea Only and Other (See Comments)    Reaction: stress attacks/ headache   . Stadol [Butorphanol] Nausea Only and Other (See Comments)    Reaction: stress attacks/ headache     Past Medical History:  Diagnosis Date  . Angina pectoris ([place])   . At high risk for falls   . Cancer ([place])   . Carpal tunnel syndrome    BILATERAL  . Cervicalgia   . Complication of anesthesia    difficult to arouse  . Diabetes mellitus without complication (HCC)    Type I  . Diabetic neuropathy ([place])   . Diabetic neuropathy ([place])   . Diabetic retinopathy ([place])   . Diabetic retinopathy ([place])   . Dizziness   . Gastroparesis   . Headache    MIGRAINE  . Herniated disc, cervical   . Hypercholesteremia   . Hypertension   . Hypokalemia   . Hypotensive episode   . Hypothyroidism   . Insulin pump in place   . Myofascial muscle pain   . Numbness of arm    left  . Osteoporosis   . Poor vision   . Rosacea   . Seizures ([place])   . Slurred speech   . 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 Stroke (HCC)    x5  . Vertigo   . Wears glasses     Review of systems:  Otherwise negative.    Physical Exam  Gen: Alert, oriented. Appears stated age.  HEENT: [place]/AT. PERRLA. Lungs: CTA, no wheezes. CV: RR nl S1, S2. Abd: soft, benign, no masses. BS+ Ext: No edema. Pulses 2+    Planned procedures: Proceed with colonoscopy. The  patient understands the nature of the planned procedure, indications, risks, alternatives and potential complications including but not limited to bleeding, infection, perforation, damage to internal organs and possible oversedation/side effects from anesthesia. The patient agrees and gives consent to proceed.  Please refer to procedure notes for findings, recommendations  and patient disposition/instructions.     Falicity Sheets K. Alice Reichert, M.D. Gastroenterology 03/17/2020  8:56 AM

## 2020-03-17 NOTE — Anesthesia Procedure Notes (Signed)
Performed by: Cook-Martin, Izella Ybanez Pre-anesthesia Checklist: Patient identified, Emergency Drugs available, Suction available, Patient being monitored and Timeout performed Patient Re-evaluated:Patient Re-evaluated prior to induction Oxygen Delivery Method: Nasal cannula Preoxygenation: Pre-oxygenation with 100% oxygen Induction Type: IV induction       

## 2020-03-17 NOTE — Progress Notes (Signed)
Anesthesiologist Dr. Andree Elk was notified of blood pressure running 92 and 88 systolic, also fsbs of 648. Patient states that her husband gave her insulin earlier this am as her blood sugar was in the 400s. She also states that her insulin pump is currently off. An order for 5 units of insulin was given. Insulin was administered at 0845. Report was given to procedure room RN Dava, who checked with Dr. Rosey Bath of anesthesia before taking the patient back to the procedure room.

## 2020-03-18 ENCOUNTER — Encounter: Payer: Self-pay | Admitting: Internal Medicine

## 2020-04-13 ENCOUNTER — Telehealth: Payer: Medicare Other | Admitting: Student in an Organized Health Care Education/Training Program

## 2020-04-14 ENCOUNTER — Encounter: Payer: Self-pay | Admitting: Student in an Organized Health Care Education/Training Program

## 2020-04-15 ENCOUNTER — Encounter: Payer: Self-pay | Admitting: Student in an Organized Health Care Education/Training Program

## 2020-04-15 ENCOUNTER — Ambulatory Visit
Payer: Medicare Other | Attending: Student in an Organized Health Care Education/Training Program | Admitting: Student in an Organized Health Care Education/Training Program

## 2020-04-15 ENCOUNTER — Other Ambulatory Visit: Payer: Self-pay

## 2020-04-15 DIAGNOSIS — E1042 Type 1 diabetes mellitus with diabetic polyneuropathy: Secondary | ICD-10-CM

## 2020-04-15 DIAGNOSIS — G44229 Chronic tension-type headache, not intractable: Secondary | ICD-10-CM

## 2020-04-15 DIAGNOSIS — G894 Chronic pain syndrome: Secondary | ICD-10-CM

## 2020-04-15 MED ORDER — BUTALBITAL-APAP-CAFFEINE 50-325-40 MG PO TABS: 1.0000 | ORAL_TABLET | Freq: Two times a day (BID) | ORAL | 5 refills | Status: DC | PRN

## 2020-04-15 NOTE — Progress Notes (Signed)
Patient: Erin Daniels  Service Category: E/M  Provider: Gillis Santa, MD  DOB: Apr 03, 1946  DOS: 04/15/2020  Location: Office  MRN: [id number]  Setting: Ambulatory outpatient  Referring Provider: Juluis Pitch, MD  Type: Established Patient  Specialty: Interventional Pain Management  PCP: Juluis Pitch, MD  Location: Home  Delivery: TeleHealth     Virtual Encounter - Pain Management PROVIDER NOTE: Information contained herein reflects review and annotations entered in association with encounter. Interpretation of such information and data should be left to medically-trained personnel. Information provided to patient can be located elsewhere in the medical record under "Patient Instructions". Document created using STT-dictation technology, any transcriptional errors that may result from process are unintentional.    Contact & Pharmacy Preferred: [phone] Home: [phone] (home) Mobile: [phone] (mobile) E-mail: [email or web address]  [place], [place] - [place] [address] [place] [address] Phone: [phone] Fax: [phone]  [place], [place] [address] [place] [address] Phone: [phone] Fax: [id number] [address], [place] [address] [place] [address] Phone: [phone] Fax: [phone]  [facility] - [address] [place], [place] [address] [place] [address] Phone: [phone] Fax: [phone]  [facility] [place], [place] - [place] [address] [place] [address] [place] [address] Phone: [phone] Fax: [phone]   Pre-screening  Ms. Baisley offered "in-person" vs "virtual" encounter. She indicated preferring virtual for this encounter.    Reason COVID-19*  Social distancing based on CDC and AMA recommendations.   I contacted Cassandra Harbold Wauters on 04/15/2020 via telephone.      I clearly identified myself as Gillis Santa, MD. I verified that I was speaking with the correct person using two identifiers (Name: VIVEKA WILMETH, and date of birth: 1946/01/01).  Consent I sought verbal advanced consent from [place] for virtual visit interactions. I informed Ms. Lapierre of possible security and privacy concerns, risks, and limitations associated with providing "not-in-person" medical evaluation and management services. I also informed Ms. Tibbits of the availability of "in-person" appointments. Finally, I informed her that there would be a charge for the virtual visit and that she could be  personally, fully or partially, financially responsible for it. Ms. Schuermann expressed understanding and agreed to proceed.   Historic Elements   Ms. Demiah Gullickson Eaglin is a 74 y.o. year old, female patient evaluated today after her last contact with our practice on 11/01/2019. Ms. Zeiders  has a past medical history of Angina pectoris ([place]), At high risk for falls, Cancer [facility]), Carpal tunnel syndrome, Cervicalgia, Complication of anesthesia, Diabetes mellitus without complication ([place]), Diabetic neuropathy ([place]), Diabetic neuropathy ([place]), Diabetic retinopathy ([place]), Diabetic retinopathy ([place]), Dizziness, Gastroparesis, Headache, Herniated disc, cervical, Hypercholesteremia, Hypertension, Hypokalemia, Hypotensive episode, Hypothyroidism, Insulin pump in place, Myofascial muscle pain, Numbness of arm, Osteoporosis, Poor vision, Rosacea, Seizures ([place]), Slurred speech, Stroke (cerebrum) ([place]), Stroke ([place]), Vertigo, and Wears glasses. She also  has a past surgical history that includes Tonsillectomy; Hemorroidectomy; Appendectomy; Tubal ligation; Trigger finger release; Bilateral carpal tunnel release; Synovectomy; Colonoscopy; Shoulder  arthroscopy (Left); Shoulder hemi-arthroplasty (Left); Open reduction internal fixation (orif) distal radial fracture (Right, 05/29/2015); Augmentation mammaplasty (Bilateral, 2011); Intramedullary (im) nail intertrochanteric (Right, 12/07/2018); Eye surgery (Bilateral); Hernia repair; Joint  replacement; Open reduction internal fixation (orif) metacarpal (Right, 01/09/2019); Fracture surgery; BITRECTOMY; and Colonoscopy with propofol (N/A, 03/17/2020). Ms. Bently has a current medication list which includes the following prescription(s): acetaminophen, aspirin ec, brimonidine-timolol, butalbital-acetaminophen-caffeine, calcium carb-cholecalciferol, cetirizine, vitamin d-3, clopidogrel, ferrous sulfate, gabapentin, glucagon emergency, glucose, insulin aspart, losartan, multiple vitamins-minerals, netarsudil-latanoprost, systane ultra, polyethylene glycol, promethazine, senna, venlafaxine, and vitamin c. She  reports that she has never smoked. She has never used smokeless tobacco. She reports that she does not drink alcohol and does not use drugs. Ms. Ditmore is allergic to carbocaine [mepivacaine hcl], hydrocodone, tussionex pennkinetic er [hydrocod polst-cpm polst er], baclofen, codeine, meperidine, metoclopramide hcl, and stadol [butorphanol].   HPI  Today, she is being contacted for medication management.  Refill of fiorcet, patient notified to not take more than 5/6x a month as this can increase her risk of rebound headache secondary to medication overuse. Informed patient that her PCP could likely manage her Fiorcet and that she didn't necessarily need to see me for Fiorcet refill.  Laboratory Chemistry Profile   Renal Lab Results  Component Value Date   BUN 48 (H) 11/20/2019   CREATININE 1.13 (H) 11/20/2019   GFRAA 55 (L) 11/20/2019   GFRNONAA 48 (L) 11/20/2019     Hepatic Lab Results  Component Value Date   AST 31 11/20/2019   ALT 23 11/20/2019   ALBUMIN 4.6 11/20/2019   ALKPHOS 70  11/20/2019   LIPASE 15 11/26/2016   AMMONIA <9 (L) 12/06/2018     Electrolytes Lab Results  Component Value Date   NA 136 11/20/2019   K 4.3 11/20/2019   CL 98 11/20/2019   CALCIUM 10.1 11/20/2019   MG 2.5 (H) 04/30/2019     Bone No results found for: VD25OH, [id number], KG8185UD1, SH7026VZ8, 25OHVITD1, 25OHVITD2, 25OHVITD3, TESTOFREE, TESTOSTERONE   Inflammation (CRP: Acute Phase) (ESR: Chronic Phase) Lab Results  Component Value Date   LATICACIDVEN 1.5 12/19/2018       Note: Above Lab results reviewed.  Imaging  MM 3D SCREEN BREAST W/IMPLANT BILATERAL CLINICAL DATA:  Screening.  EXAM: DIGITAL SCREENING BILATERAL MAMMOGRAM WITH IMPLANTS, CAD AND TOMO  The patient has retroglandular implants. Standard and implant displaced views were performed.  COMPARISON:  Previous exam(s).  ACR Breast Density Category c: The breast tissue is heterogeneously dense, which may obscure small masses.  FINDINGS: There are no findings suspicious for malignancy. Images were processed with CAD.  IMPRESSION: No mammographic evidence of malignancy. A result letter of this screening mammogram will be mailed directly to the patient.  RECOMMENDATION: Screening mammogram in one year. (Code:SM-B-01Y)  BI-RADS CATEGORY  1:  Negative.  Electronically Signed   By: Audie Pinto M.D.   On: 11/21/2019 16:17  Assessment  The primary encounter diagnosis was Chronic tension-type headache, not intractable. Diagnoses of Diabetic polyneuropathy associated with type 1 diabetes mellitus (HCC) and Chronic pain syndrome were also pertinent to this visit.  Plan of Care   Ms. Ander Gaster Oquendo has a current medication list which includes the following long-term medication(s): cetirizine, gabapentin, glucagon emergency, glucose, and venlafaxine.  Pharmacotherapy (Medications Ordered): Meds ordered this encounter  Medications  . butalbital-acetaminophen-caffeine (FIORICET) 50-325-40 MG tablet     Sig: Take 1 tablet by mouth 2 (two) times daily as needed for headache or migraine.    Dispense:  180 tablet    Refill:  5   Follow-up plan:   Return if symptoms worsen or fail to improve.   Recent Visits No visits were found meeting  these conditions. Showing recent visits within past 90 days and meeting all other requirements Today's Visits Date Type Provider Dept  04/15/20 Telemedicine Gillis Santa, MD [facility]  Showing today's visits and meeting all other requirements Future Appointments No visits were found meeting these conditions. Showing future appointments within next 90 days and meeting all other requirements  I discussed the assessment and treatment plan with the patient. The patient was provided an opportunity to ask questions and all were answered. The patient agreed with the plan and demonstrated an understanding of the instructions.  Patient advised to call back or seek an in-person evaluation if the symptoms or condition worsens.  Duration of encounter:10 minutes.  Note by: Gillis Santa, MD Date: 04/15/2020; Time: 9:28 AM

## 2020-09-28 ENCOUNTER — Telehealth: Payer: Self-pay

## 2020-09-28 NOTE — Telephone Encounter (Signed)
Pt is requesting a nurse call concerning her Rx. [place] is going out of [place] and fears she will run out while she is gone.

## 2020-09-28 NOTE — Telephone Encounter (Signed)
Patient states she no longer has questions.

## 2020-10-21 ENCOUNTER — Other Ambulatory Visit: Payer: Self-pay | Admitting: Family Medicine

## 2020-10-21 DIAGNOSIS — Z1231 Encounter for screening mammogram for malignant neoplasm of breast: Secondary | ICD-10-CM

## 2020-11-29 ENCOUNTER — Other Ambulatory Visit: Payer: Self-pay | Admitting: Family Medicine

## 2020-11-29 ENCOUNTER — Ambulatory Visit
Admission: RE | Admit: 2020-11-29 | Discharge: 2020-11-29 | Disposition: A | Discharge status: Home / Self Care | Payer: Medicare Other | Source: Ambulatory Visit | Attending: Family Medicine | Admitting: Family Medicine

## 2020-11-29 ENCOUNTER — Other Ambulatory Visit: Payer: Self-pay

## 2020-11-29 DIAGNOSIS — Z1231 Encounter for screening mammogram for malignant neoplasm of breast: Secondary | ICD-10-CM | POA: Diagnosis not present

## 2020-12-26 IMAGING — CT CT HEAD W/O CM
3 of 4 series · 14 of 47 positions shown, 16 images · non-contrast
Comparison: CT scan of May 01, 2019.

CLINICAL DATA: Follow-up subarachnoid hemorrhage.

EXAM:
CT HEAD WITHOUT CONTRAST
TECHNIQUE: Contiguous axial images were obtained from the base of the skull
through the vertex without intravenous contrast.

[Series 4: head · axial · 0.35mm/px · z∈[-526,-422]mm · 8 of 66 slices shown, 10 images (1 of 3)]
[im 7/66  brain]
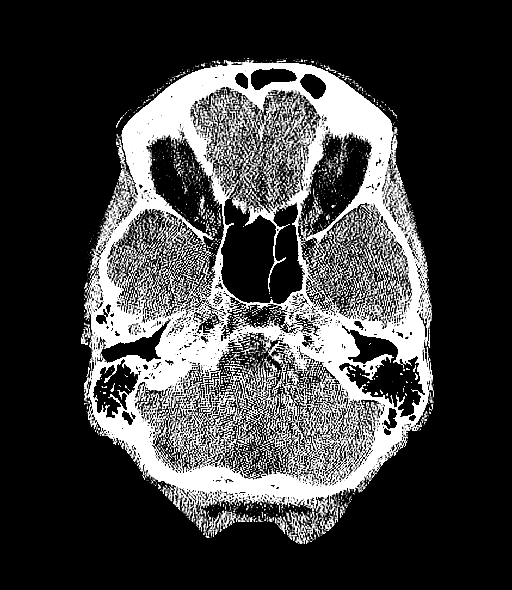
[im 7/66  bone]
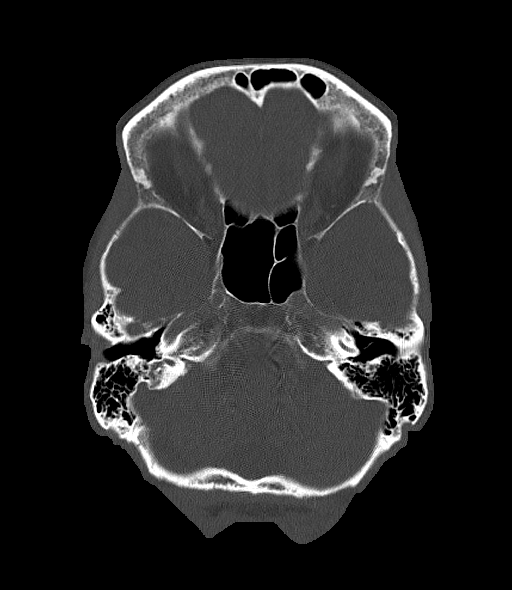
[im 13/66  brain]
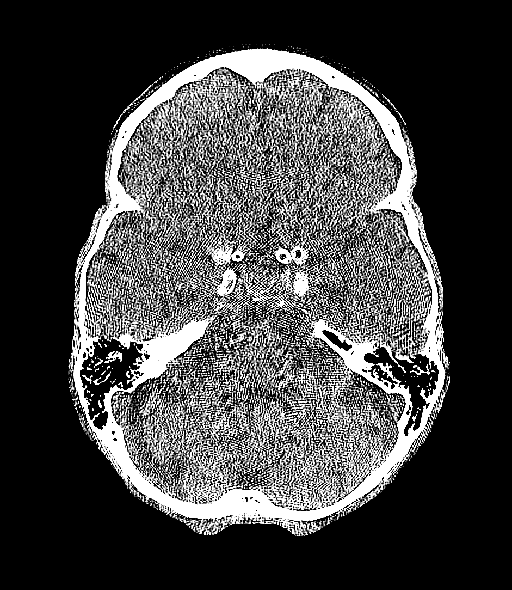
[im 22/66  brain]
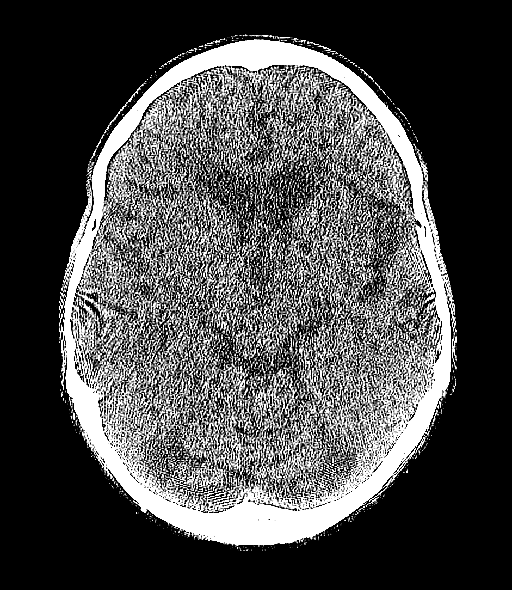
[im 28/66  brain]
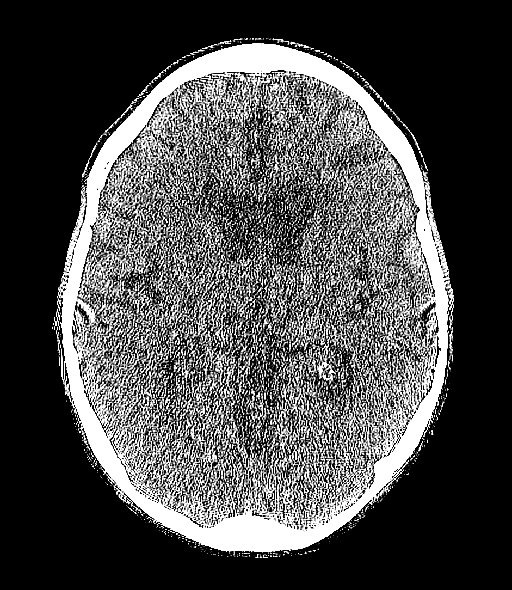
[im 38/66  brain]
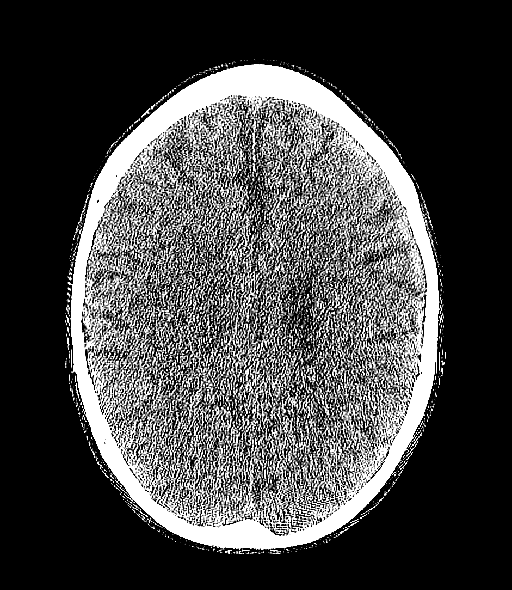
[im 38/66  bone]
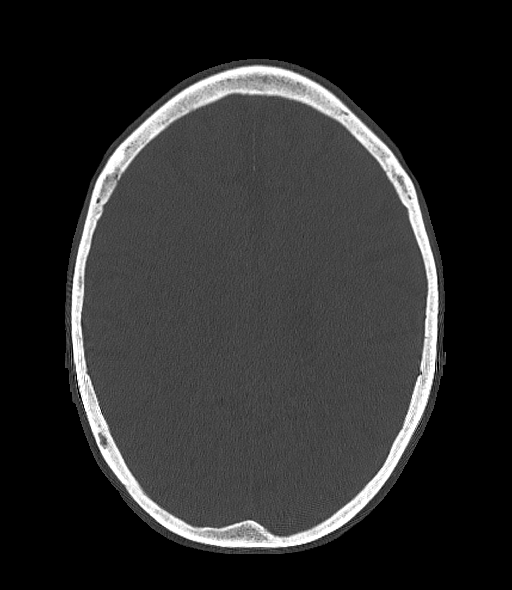
[im 44/66  brain]
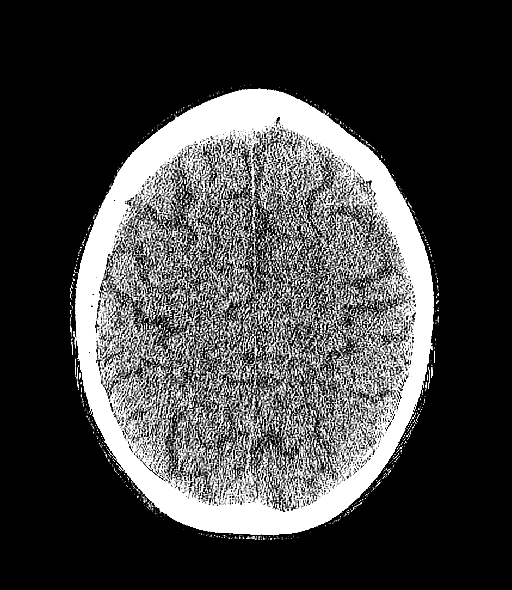
[im 53/66  brain]
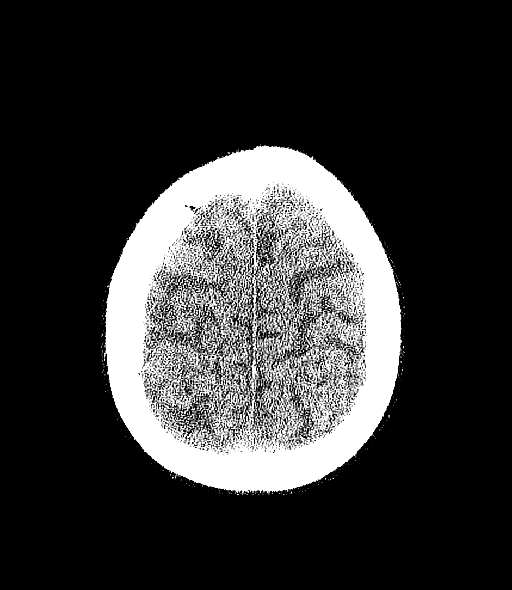
[im 59/66  brain]
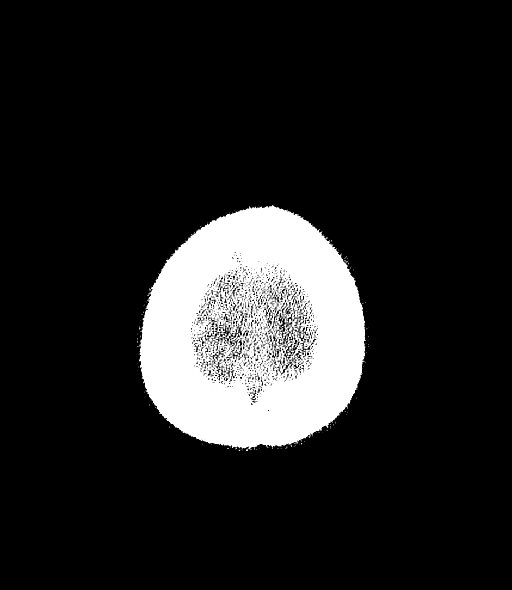

[Series 6: head · coronal · 0.26mm/px · 3 of 69 slices shown (2 of 3)]
[im 23/69  brain]
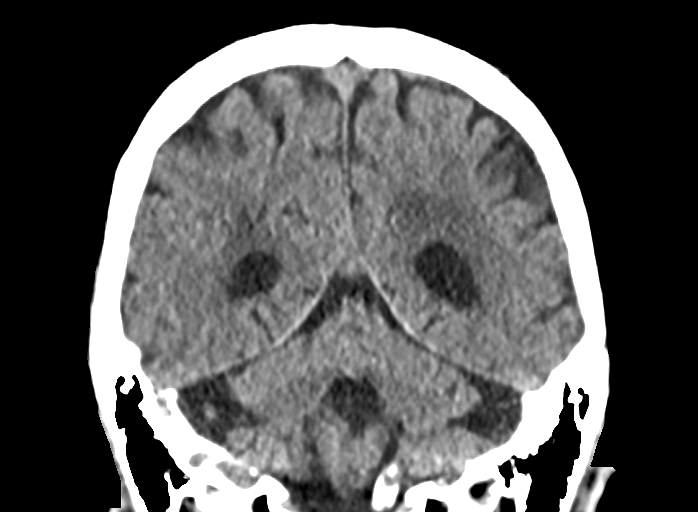
[im 31/69  brain]
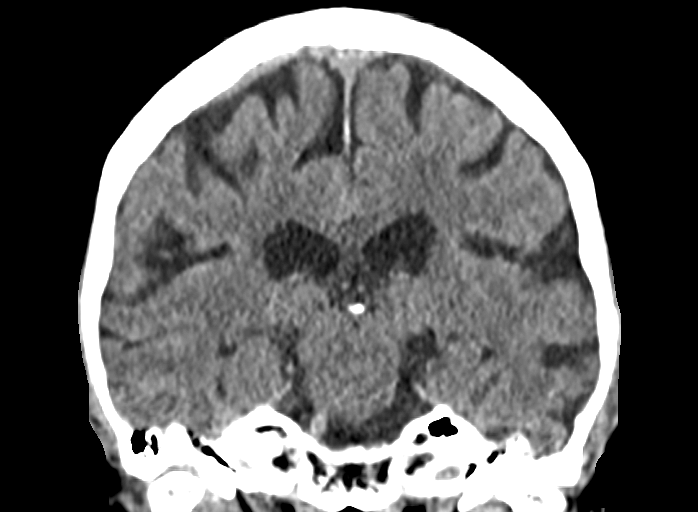
[im 38/69  brain]
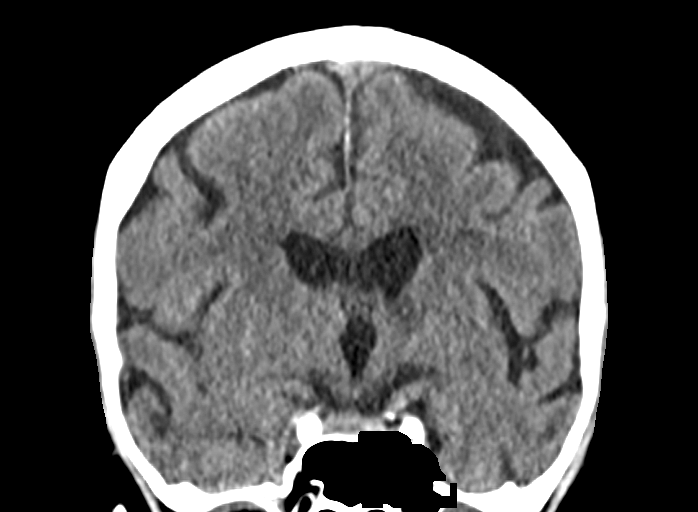

[Series 8: head · sagittal · 0.26mm/px · 3 of 60 slices shown (3 of 3)]
[im 20/60  brain]
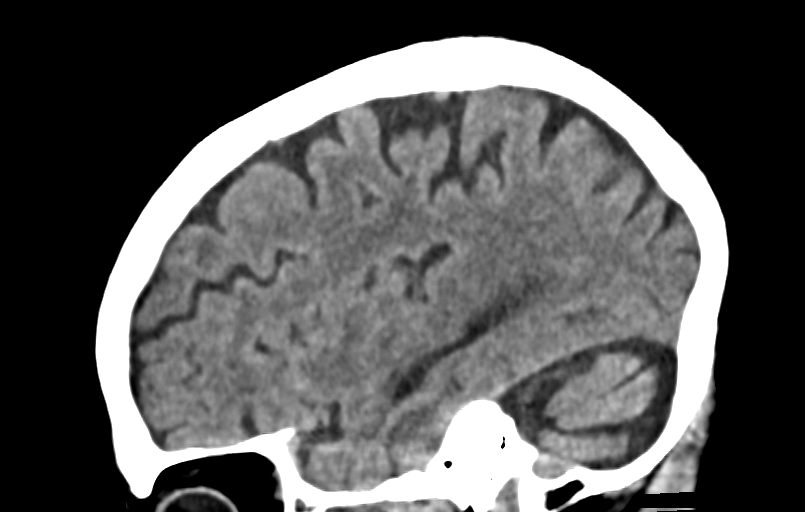
[im 30/60  brain]
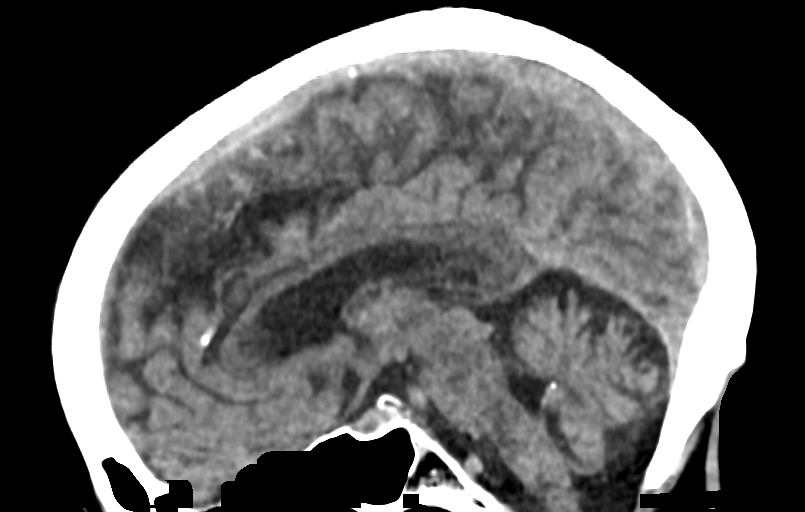
[im 40/60  brain]
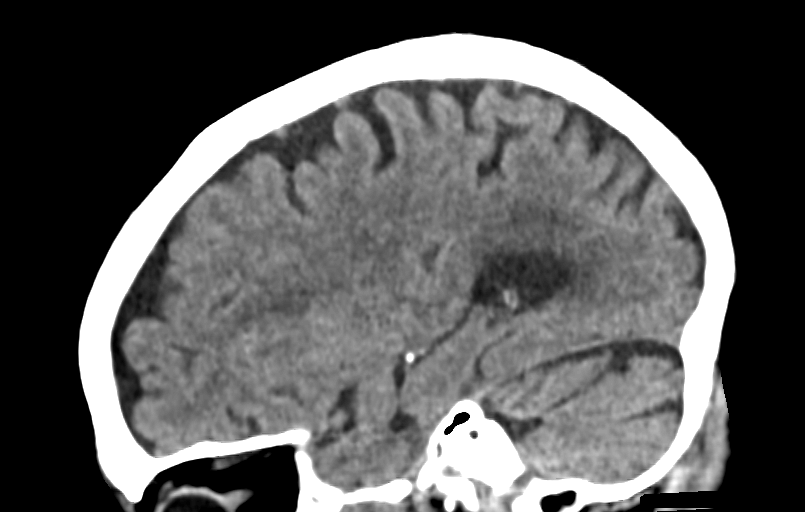

[14 of 47 positions shown; findings below may reference images not displayed]

FINDINGS: Brain: Mild chronic ischemic white matter disease is noted. No mass
effect or midline shift is noted. Ventricular size is within normal
limits. There is no evidence of mass lesion, hemorrhage or acute
infarction.

Vascular: No hyperdense vessel or unexpected calcification.

Skull: Normal. Negative for fracture or focal lesion.

Sinuses/Orbits: No acute finding.

Other: None.
IMPRESSION: Mild chronic ischemic white matter disease. No acute intracranial
abnormality seen.

## 2021-05-07 ENCOUNTER — Emergency Department: Payer: Medicare Other

## 2021-05-07 ENCOUNTER — Other Ambulatory Visit: Payer: Self-pay

## 2021-05-07 ENCOUNTER — Inpatient Hospital Stay
Admission: EM | Admit: 2021-05-07 | Discharge: 2021-05-16 | DRG: 478 | Disposition: A | Discharge status: Skilled Nursing Facility | Payer: Medicare Other | Attending: Internal Medicine | Admitting: Internal Medicine

## 2021-05-07 ENCOUNTER — Observation Stay: Payer: Medicare Other

## 2021-05-07 ENCOUNTER — Encounter: Payer: Self-pay | Admitting: Internal Medicine

## 2021-05-07 DIAGNOSIS — W19XXXA Unspecified fall, initial encounter: Secondary | ICD-10-CM | POA: Diagnosis present

## 2021-05-07 DIAGNOSIS — Z888 Allergy status to other drugs, medicaments and biological substances status: Secondary | ICD-10-CM

## 2021-05-07 DIAGNOSIS — E039 Hypothyroidism, unspecified: Secondary | ICD-10-CM | POA: Diagnosis present

## 2021-05-07 DIAGNOSIS — Z9189 Other specified personal risk factors, not elsewhere classified: Secondary | ICD-10-CM

## 2021-05-07 DIAGNOSIS — Z8673 Personal history of transient ischemic attack (TIA), and cerebral infarction without residual deficits: Secondary | ICD-10-CM

## 2021-05-07 DIAGNOSIS — Z794 Long term (current) use of insulin: Secondary | ICD-10-CM

## 2021-05-07 DIAGNOSIS — Z7902 Long term (current) use of antithrombotics/antiplatelets: Secondary | ICD-10-CM

## 2021-05-07 DIAGNOSIS — I10 Essential (primary) hypertension: Secondary | ICD-10-CM | POA: Diagnosis present

## 2021-05-07 DIAGNOSIS — Z23 Encounter for immunization: Secondary | ICD-10-CM

## 2021-05-07 DIAGNOSIS — Z8249 Family history of ischemic heart disease and other diseases of the circulatory system: Secondary | ICD-10-CM

## 2021-05-07 DIAGNOSIS — Z7984 Long term (current) use of oral hypoglycemic drugs: Secondary | ICD-10-CM

## 2021-05-07 DIAGNOSIS — Z79899 Other long term (current) drug therapy: Secondary | ICD-10-CM

## 2021-05-07 DIAGNOSIS — Y92009 Unspecified place in unspecified non-institutional (private) residence as the place of occurrence of the external cause: Secondary | ICD-10-CM

## 2021-05-07 DIAGNOSIS — Z885 Allergy status to narcotic agent status: Secondary | ICD-10-CM

## 2021-05-07 DIAGNOSIS — E871 Hypo-osmolality and hyponatremia: Secondary | ICD-10-CM | POA: Diagnosis present

## 2021-05-07 DIAGNOSIS — E1043 Type 1 diabetes mellitus with diabetic autonomic (poly)neuropathy: Secondary | ICD-10-CM | POA: Diagnosis present

## 2021-05-07 DIAGNOSIS — M810 Age-related osteoporosis without current pathological fracture: Secondary | ICD-10-CM | POA: Diagnosis present

## 2021-05-07 DIAGNOSIS — Z96612 Presence of left artificial shoulder joint: Secondary | ICD-10-CM | POA: Diagnosis present

## 2021-05-07 DIAGNOSIS — Z20822 Contact with and (suspected) exposure to covid-19: Secondary | ICD-10-CM | POA: Diagnosis present

## 2021-05-07 DIAGNOSIS — I639 Cerebral infarction, unspecified: Secondary | ICD-10-CM | POA: Diagnosis present

## 2021-05-07 DIAGNOSIS — S52023A Displaced fracture of olecranon process without intraarticular extension of unspecified ulna, initial encounter for closed fracture: Secondary | ICD-10-CM | POA: Diagnosis present

## 2021-05-07 DIAGNOSIS — R590 Localized enlarged lymph nodes: Secondary | ICD-10-CM | POA: Diagnosis present

## 2021-05-07 DIAGNOSIS — Z7982 Long term (current) use of aspirin: Secondary | ICD-10-CM

## 2021-05-07 DIAGNOSIS — E1042 Type 1 diabetes mellitus with diabetic polyneuropathy: Secondary | ICD-10-CM | POA: Diagnosis present

## 2021-05-07 DIAGNOSIS — Z9641 Presence of insulin pump (external) (internal): Secondary | ICD-10-CM | POA: Diagnosis present

## 2021-05-07 DIAGNOSIS — K3184 Gastroparesis: Secondary | ICD-10-CM | POA: Diagnosis present

## 2021-05-07 DIAGNOSIS — F32A Depression, unspecified: Secondary | ICD-10-CM | POA: Diagnosis present

## 2021-05-07 DIAGNOSIS — S2231XA Fracture of one rib, right side, initial encounter for closed fracture: Secondary | ICD-10-CM | POA: Diagnosis not present

## 2021-05-07 DIAGNOSIS — R8271 Bacteriuria: Secondary | ICD-10-CM | POA: Diagnosis present

## 2021-05-07 DIAGNOSIS — W1830XA Fall on same level, unspecified, initial encounter: Secondary | ICD-10-CM | POA: Diagnosis present

## 2021-05-07 DIAGNOSIS — G8929 Other chronic pain: Secondary | ICD-10-CM | POA: Diagnosis present

## 2021-05-07 DIAGNOSIS — E7800 Pure hypercholesterolemia, unspecified: Secondary | ICD-10-CM | POA: Diagnosis present

## 2021-05-07 DIAGNOSIS — E103593 Type 1 diabetes mellitus with proliferative diabetic retinopathy without macular edema, bilateral: Secondary | ICD-10-CM | POA: Diagnosis present

## 2021-05-07 DIAGNOSIS — M4854XA Collapsed vertebra, not elsewhere classified, thoracic region, initial encounter for fracture: Secondary | ICD-10-CM | POA: Diagnosis present

## 2021-05-07 DIAGNOSIS — S52022A Displaced fracture of olecranon process without intraarticular extension of left ulna, initial encounter for closed fracture: Principal | ICD-10-CM | POA: Diagnosis present

## 2021-05-07 DIAGNOSIS — Z96641 Presence of right artificial hip joint: Secondary | ICD-10-CM | POA: Diagnosis present

## 2021-05-07 DIAGNOSIS — R410 Disorientation, unspecified: Secondary | ICD-10-CM | POA: Diagnosis present

## 2021-05-07 DIAGNOSIS — Z419 Encounter for procedure for purposes other than remedying health state, unspecified: Secondary | ICD-10-CM

## 2021-05-07 LAB — COMPREHENSIVE METABOLIC PANEL
ALT: 18 U/L (ref 0–44)
AST: 25 U/L (ref 15–41)
Albumin: 3.6 g/dL (ref 3.5–5.0)
Alkaline Phosphatase: 78 U/L (ref 38–126)
Anion gap: 8 (ref 5–15)
BUN: 16 mg/dL (ref 8–23)
CO2: 27 mmol/L (ref 22–32)
Calcium: 8.8 mg/dL — ABNORMAL LOW (ref 8.9–10.3)
Chloride: 99 mmol/L (ref 98–111)
Creatinine, Ser: 0.68 mg/dL (ref 0.44–1.00)
GFR, Estimated: >60 mL/min (ref >60)
Glucose, Bld: 223 mg/dL — ABNORMAL HIGH (ref 70–99)
Potassium: 4.4 mmol/L (ref 3.5–5.1)
Sodium: 134 mmol/L — ABNORMAL LOW (ref 135–145)
Total Bilirubin: 0.4 mg/dL (ref 0.3–1.2)
Total Protein: 6.9 g/dL (ref 6.5–8.1)

## 2021-05-07 LAB — CBC WITH DIFFERENTIAL/PLATELET
Abs Immature Granulocytes: 0.04 10*3/uL (ref 0.00–0.07)
Basophils Absolute: 0 10*3/uL (ref 0.0–0.1)
Basophils Relative: 0 %
Eosinophils Absolute: 0.1 10*3/uL (ref 0.0–0.5)
Eosinophils Relative: 2 %
HCT: 31.3 % — ABNORMAL LOW (ref 36.0–46.0)
Hemoglobin: 10.6 g/dL — ABNORMAL LOW (ref 12.0–15.0)
Immature Granulocytes: 1 %
Lymphocytes Relative: 12 %
Lymphs Abs: 1 10*3/uL (ref 0.7–4.0)
MCH: 32 pg (ref 26.0–34.0)
MCHC: 33.9 g/dL (ref 30.0–36.0)
MCV: 94.6 fL (ref 80.0–100.0)
Monocytes Absolute: 0.7 10*3/uL (ref 0.1–1.0)
Monocytes Relative: 9 %
Neutro Abs: 6.4 10*3/uL (ref 1.7–7.7)
Neutrophils Relative %: 76 %
Platelets: 248 10*3/uL (ref 150–400)
RBC: 3.31 MIL/uL — ABNORMAL LOW (ref 3.87–5.11)
RDW: 11.8 % (ref 11.5–15.5)
WBC: 8.4 10*3/uL (ref 4.0–10.5)
nRBC: 0 % (ref 0.0–0.2)

## 2021-05-07 LAB — RESP PANEL BY RT-PCR (FLU A&B, COVID) ARPGX2
Influenza A by PCR: NEGATIVE
Influenza B by PCR: NEGATIVE
SARS Coronavirus 2 by RT PCR: NEGATIVE

## 2021-05-07 LAB — CK: Total CK: 188 U/L (ref 38–234)

## 2021-05-07 LAB — GLUCOSE, CAPILLARY: Glucose-Capillary: 127 mg/dL — ABNORMAL HIGH (ref 70–99)

## 2021-05-07 MED ORDER — BUTALBITAL-APAP-CAFFEINE 50-325-40 MG PO TABS: 1.0000 | ORAL_TABLET | Freq: Two times a day (BID) | ORAL | Status: DC | PRN

## 2021-05-07 MED ORDER — LOSARTAN POTASSIUM 50 MG PO TABS
50.0000 mg | ORAL_TABLET | Freq: Every evening | ORAL | Status: DC
  Administered 2021-05-09: 50 mg via ORAL
  Filled 2021-05-07 (×2): qty 1

## 2021-05-07 MED ORDER — SENNA 8.6 MG PO TABS
1.0000 | ORAL_TABLET | Freq: Every day | ORAL | Status: DC
  Administered 2021-05-08: 8.6 mg via ORAL
  Filled 2021-05-07: qty 1

## 2021-05-07 MED ORDER — INSULIN ASPART 100 UNIT/ML IJ SOLN
0.0000 [IU] | Freq: Every day | INTRAMUSCULAR | Status: DC
  Administered 2021-05-08: 2 [IU] via SUBCUTANEOUS
  Filled 2021-05-07: qty 1

## 2021-05-07 MED ORDER — INSULIN ASPART 100 UNIT/ML IJ SOLN
0.0000 [IU] | Freq: Three times a day (TID) | INTRAMUSCULAR | Status: DC
  Administered 2021-05-08: 7 [IU] via SUBCUTANEOUS
  Administered 2021-05-08: 3 [IU] via SUBCUTANEOUS
  Filled 2021-05-07 (×2): qty 1

## 2021-05-07 MED ORDER — CLOPIDOGREL BISULFATE 75 MG PO TABS
75.0000 mg | ORAL_TABLET | Freq: Every evening | ORAL | Status: DC
  Administered 2021-05-07: 75 mg via ORAL
  Filled 2021-05-07: qty 1

## 2021-05-07 MED ORDER — ONDANSETRON HCL 4 MG PO TABS: 4.0000 mg | ORAL_TABLET | Freq: Four times a day (QID) | ORAL | Status: DC | PRN

## 2021-05-07 MED ORDER — GABAPENTIN 300 MG PO CAPS
300.0000 mg | ORAL_CAPSULE | Freq: Two times a day (BID) | ORAL | Status: DC
  Administered 2021-05-08 – 2021-05-09 (×3): 300 mg via ORAL
  Filled 2021-05-07 (×3): qty 1

## 2021-05-07 MED ORDER — ONDANSETRON HCL 4 MG/2ML IJ SOLN
4.0000 mg | Freq: Once | INTRAMUSCULAR | Status: AC
  Administered 2021-05-07: 4 mg via INTRAVENOUS
  Filled 2021-05-07: qty 2

## 2021-05-07 MED ORDER — CALCIUM CARBONATE-VITAMIN D 500-200 MG-UNIT PO TABS
1.0000 | ORAL_TABLET | Freq: Every evening | ORAL | Status: DC
  Administered 2021-05-07 – 2021-05-15 (×7): 1 via ORAL
  Filled 2021-05-07 (×8): qty 1

## 2021-05-07 MED ORDER — SODIUM CHLORIDE 0.9 % IV SOLN
1.0000 g | INTRAVENOUS | Status: DC
  Administered 2021-05-07: 1 g via INTRAVENOUS
  Filled 2021-05-07: qty 1
  Filled 2021-05-07: qty 10

## 2021-05-07 MED ORDER — VITAMIN D3 25 MCG (1000 UNIT) PO TABS
5000.0000 [IU] | ORAL_TABLET | Freq: Every day | ORAL | Status: DC
  Administered 2021-05-08 – 2021-05-16 (×9): 5000 [IU] via ORAL
  Filled 2021-05-07 (×16): qty 5

## 2021-05-07 MED ORDER — SODIUM CHLORIDE 0.9% FLUSH
3.0000 mL | Freq: Two times a day (BID) | INTRAVENOUS | Status: DC
  Administered 2021-05-07 – 2021-05-16 (×18): 3 mL via INTRAVENOUS

## 2021-05-07 MED ORDER — ACETAMINOPHEN 325 MG PO TABS
650.0000 mg | ORAL_TABLET | Freq: Four times a day (QID) | ORAL | Status: DC | PRN
  Administered 2021-05-07 – 2021-05-10 (×5): 650 mg via ORAL
  Filled 2021-05-07 (×6): qty 2

## 2021-05-07 MED ORDER — ADULT MULTIVITAMIN W/MINERALS CH
1.0000 | ORAL_TABLET | Freq: Every day | ORAL | Status: DC
  Administered 2021-05-08 – 2021-05-16 (×9): 1 via ORAL
  Filled 2021-05-07 (×10): qty 1

## 2021-05-07 MED ORDER — BRIMONIDINE TARTRATE-TIMOLOL 0.2-0.5 % OP SOLN
1.0000 [drp] | Freq: Three times a day (TID) | OPHTHALMIC | Status: DC
  Filled 2021-05-07: qty 5

## 2021-05-07 MED ORDER — POLYETHYLENE GLYCOL 3350 17 G PO PACK
17.0000 g | PACK | ORAL | Status: DC
  Administered 2021-05-08: 17 g via ORAL
  Filled 2021-05-07: qty 1

## 2021-05-07 MED ORDER — ASPIRIN EC 81 MG PO TBEC
81.0000 mg | DELAYED_RELEASE_TABLET | Freq: Every day | ORAL | Status: DC
  Administered 2021-05-08: 81 mg via ORAL
  Filled 2021-05-07: qty 1

## 2021-05-07 MED ORDER — GABAPENTIN 300 MG PO CAPS
900.0000 mg | ORAL_CAPSULE | Freq: Every day | ORAL | Status: DC
  Administered 2021-05-07: 900 mg via ORAL
  Filled 2021-05-07: qty 3

## 2021-05-07 MED ORDER — POLYVINYL ALCOHOL 1.4 % OP SOLN
1.0000 [drp] | Freq: Every day | OPHTHALMIC | Status: DC | PRN
  Filled 2021-05-07: qty 15

## 2021-05-07 MED ORDER — GABAPENTIN 300 MG PO CAPS: 300.0000 mg | ORAL_CAPSULE | ORAL | Status: DC

## 2021-05-07 MED ORDER — ASCORBIC ACID 500 MG PO TABS
500.0000 mg | ORAL_TABLET | Freq: Two times a day (BID) | ORAL | Status: DC
  Administered 2021-05-07 – 2021-05-11 (×8): 500 mg via ORAL
  Filled 2021-05-07 (×8): qty 1

## 2021-05-07 MED ORDER — MORPHINE SULFATE (PF) 4 MG/ML IV SOLN
4.0000 mg | Freq: Once | INTRAVENOUS | Status: AC
  Administered 2021-05-07: 4 mg via INTRAVENOUS
  Filled 2021-05-07: qty 1

## 2021-05-07 MED ORDER — ONDANSETRON HCL 4 MG/2ML IJ SOLN
4.0000 mg | Freq: Four times a day (QID) | INTRAMUSCULAR | Status: DC | PRN
  Administered 2021-05-08: 4 mg via INTRAVENOUS
  Filled 2021-05-07: qty 2

## 2021-05-07 MED ORDER — NETARSUDIL-LATANOPROST 0.02-0.005 % OP SOLN: 1.0000 [drp] | Freq: Every day | OPHTHALMIC | Status: DC

## 2021-05-07 MED ORDER — HYDROCODONE-ACETAMINOPHEN 5-325 MG PO TABS
1.0000 | ORAL_TABLET | ORAL | Status: DC | PRN
  Administered 2021-05-07 – 2021-05-08 (×2): 1 via ORAL
  Filled 2021-05-07 (×3): qty 1

## 2021-05-07 MED ORDER — SODIUM CHLORIDE 0.9 % IV SOLN: INTRAVENOUS | Status: AC

## 2021-05-07 MED ORDER — VENLAFAXINE HCL 37.5 MG PO TABS
75.0000 mg | ORAL_TABLET | Freq: Two times a day (BID) | ORAL | Status: DC
  Administered 2021-05-07 – 2021-05-16 (×17): 75 mg via ORAL
  Filled 2021-05-07 (×20): qty 2

## 2021-05-07 MED ORDER — TIMOLOL MALEATE 0.5 % OP SOLN
1.0000 [drp] | Freq: Three times a day (TID) | OPHTHALMIC | Status: DC
  Administered 2021-05-08 – 2021-05-16 (×25): 1 [drp] via OPHTHALMIC
  Filled 2021-05-07: qty 5

## 2021-05-07 MED ORDER — MORPHINE SULFATE (PF) 2 MG/ML IV SOLN
2.0000 mg | INTRAVENOUS | Status: AC | PRN
  Administered 2021-05-07 – 2021-05-08 (×2): 2 mg via INTRAVENOUS
  Filled 2021-05-07 (×2): qty 1

## 2021-05-07 MED ORDER — BRIMONIDINE TARTRATE 0.2 % OP SOLN
1.0000 [drp] | Freq: Three times a day (TID) | OPHTHALMIC | Status: DC
  Administered 2021-05-08 – 2021-05-16 (×25): 1 [drp] via OPHTHALMIC
  Filled 2021-05-07: qty 5

## 2021-05-07 MED ORDER — INSULIN PUMP
Freq: Three times a day (TID) | SUBCUTANEOUS | Status: DC
  Filled 2021-05-07: qty 1

## 2021-05-07 NOTE — ED Notes (Signed)
Informed RN bed assigned 

## 2021-05-07 NOTE — ED Provider Notes (Signed)
[facility] Emergency Department Provider Note  ____________________________________________   Event Date/Time   First MD Initiated Contact with Patient 05/07/21 1301     (approximate)  I have reviewed the triage vital signs and the nursing notes.   HISTORY  Chief Complaint Back Pain and Arm Injury    HPI Erin Daniels is a 75 y.o. female presents emergency department after a fall last night.  Patient was trying to walk without her walker and fell landing on the left elbow and right side.  Patient is on Plavix.  Severe pain at the left elbow with a lot of swelling.  Pain in the right ribs and right lower back.  States she did not hit her head.  Husband states that she is acting appropriately.  Concerned that she is in a lot of pain.  Patient is able to take morphine although she does have several narcotic allergies listed.  Past Medical History:  Diagnosis Date   Angina pectoris ([place])    At high risk for falls    Cancer [facility])    Carpal tunnel syndrome    BILATERAL   Cervicalgia    Complication of anesthesia    difficult to arouse   Diabetes mellitus without complication (HCC)    Type I   Diabetic neuropathy (HCC)    Diabetic neuropathy ([place])    Diabetic retinopathy ([place])    Diabetic retinopathy ([place])    Dizziness    Gastroparesis    Headache    MIGRAINE   Herniated disc, cervical    Hypercholesteremia    Hypertension    Hypokalemia    Hypotensive episode    Hypothyroidism    Insulin pump in place    Myofascial muscle pain    Numbness of arm    left   Osteoporosis    Poor vision    Rosacea    Seizures (HCC)    Slurred speech    Stroke (cerebrum) ([place])    4/19   Stroke ([place])    x5   Vertigo    Wears glasses     Patient Active Problem List   Diagnosis Date Noted   Fall 05/07/2021   Pressure injury of skin 123XX123   Acute metabolic encephalopathy 123XX123   HLD (hyperlipidemia) 08/11/2019   Stroke ([place]) 08/11/2019    Iron deficiency anemia 08/11/2019   Subarachnoid hematoma ([place]) 04/30/2019   Intertrochanteric fracture of right hip ([place]) 12/08/2018   Hypercoagulopathy ([place]) 09/29/2018   Acute anxiety 09/29/2018   Hypertension associated with type 1 diabetes mellitus ([place]) 09/19/2018   Dyslipidemia due to type 1 diabetes mellitus ([place]) 09/19/2018   Anemia of chronic disease 09/19/2018   Chronic migraine without aura 09/19/2018   Chronic non-seasonal allergic rhinitis 09/19/2018   Major depression, recurrent, chronic ([place]) 09/19/2018   Diabetic gastroparesis associated with type 1 diabetes mellitus ([place]) 09/19/2018   Bilateral sacral insufficiency fracture 09/19/2018   Constipation 09/15/2018   Acute urinary retention 09/15/2018   Intractable pain 09/15/2018   Chronic cerebrovascular accident (CVA) 05/28/2018   Diabetic polyneuropathy associated with type 1 diabetes mellitus ([place]) 12/10/2017   Numbness and tingling of both legs 12/10/2017   Chronic pain syndrome 12/10/2017   DKA (diabetic ketoacidoses) 11/12/2017   Confusion 11/11/2017   Hypoglycemia 11/26/2016   Lactic acid acidosis    Sepsis (HCC)    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)    Essential hypertension    Depression    Right  radial fracture 05/29/2015    Past Surgical History:  Procedure Laterality Date   APPENDECTOMY     AUGMENTATION MAMMAPLASTY Bilateral 2011   BILATERAL CARPAL TUNNEL RELEASE     BITRECTOMY     MEMBRAIN PEEL EYE   COLONOSCOPY     COLONOSCOPY WITH PROPOFOL N/A 03/17/2020   Procedure: COLONOSCOPY WITH PROPOFOL;  Surgeon: Toledo, Benay Pike, MD;  Location: [facility] ENDOSCOPY;  Service: Gastroenterology;  Laterality: N/A;   EYE SURGERY Bilateral    cataract   FRACTURE SURGERY     HEMORROIDECTOMY     HERNIA REPAIR     umbilical   INTRAMEDULLARY (IM) NAIL INTERTROCHANTERIC Right 12/07/2018   Procedure: INTRAMEDULLARY (IM) NAIL INTERTROCHANTRIC;  Surgeon: Thornton Park, MD;   Location: [facility] ORS;  Service: Orthopedics;  Laterality: Right;   JOINT REPLACEMENT     right hip   OPEN REDUCTION INTERNAL FIXATION (ORIF) DISTAL RADIAL FRACTURE Right 05/29/2015   Procedure: OPEN REDUCTION INTERNAL FIXATION (ORIF) RIGHT DISTAL RADIUS FRACTURE WITH ALLOGRAFT BONE GRAFTING FOR REPAIR AND RECONSTRUCTION;  Surgeon: Roseanne Kaufman, MD;  Location: [place];  Service: Orthopedics;  Laterality: Right;   OPEN REDUCTION INTERNAL FIXATION (ORIF) METACARPAL Right 01/09/2019   Procedure: OPEN REDUCTION INTERNAL FIXATION (ORIF) RIGHT SECOND METACARPAL FRACTURE;  Surgeon: Corky Mull, MD;  Location: [facility] ORS;  Service: Orthopedics;  Laterality: Right;   SHOULDER ARTHROSCOPY Left    SHOULDER HEMI-ARTHROPLASTY Left    SYNOVECTOMY     TONSILLECTOMY     TRIGGER FINGER RELEASE     TUBAL LIGATION      Prior to Admission medications   Medication Sig Start Date End Date Taking? Authorizing Provider  aspirin EC 81 MG tablet Take 81 mg by mouth daily.   Yes [provider]  atorvastatin (LIPITOR) 40 MG tablet Take 40 mg by mouth daily. 04/13/21  Yes [provider]  brimonidine-timolol (COMBIGAN) 0.2-0.5 % ophthalmic solution Place 1 drop into the right eye 3 (three) times daily. Instill one drop in right eye morning, noon and bedtime    Yes [provider]  butalbital-acetaminophen-caffeine (FIORICET) 50-325-40 MG tablet Take 1 tablet by mouth 2 (two) times daily as needed for headache or migraine. Patient taking differently: Take 0.5-3 tablets by mouth daily as needed for headache or migraine. 04/15/20  Yes Gillis Santa, MD  Calcium Carb-Cholecalciferol 600-200 MG-UNIT TABS Take 1 tablet by mouth daily with supper.    Yes [provider]  clopidogrel (PLAVIX) 75 MG tablet Take 75 mg by mouth daily with supper.   Yes [provider]  ferrous sulfate 325 (65 FE) MG tablet Take 325 mg by mouth daily with supper.    Yes [provider]  gabapentin  (NEURONTIN) 300 MG capsule 300 mg in the morning, 300 mg in the afternoon, 900 mg in the evening Patient taking differently: Take 300-900 mg by mouth See admin instructions. Take 1 capsule ('300mg'$ ) by mouth every morning, 1 capsule ('300mg'$ ) by mouth at lunch and take 3 capsules ('900mg'$ ) by mouth every night at bedtime 10/10/18  Yes Medina-Vargas, Monina C, NP  glucose 4 GM chewable tablet Chew 1 tablet (4 g total) by mouth as needed for low blood sugar. <65 10/10/18  Yes Medina-Vargas, Monina C, NP  insulin aspart (NOVOLOG) 100 UNIT/ML injection Inject into the skin continuous. (insulin pump averages about 20u over 24 hours)   Yes [provider]  losartan (COZAAR) 50 MG tablet Take 50 mg by mouth daily.   Yes [provider]  Multiple Vitamin (MULTIVITAMIN WITH MINERALS) TABS tablet Take 1 tablet by mouth daily.   Yes [provider]  Netarsudil-Latanoprost 0.02-0.005 % SOLN Place 1 drop into the right eye at bedtime.    Yes [provider]  Polyethyl Glycol-Propyl Glycol (SYSTANE ULTRA) 0.4-0.3 % SOLN Place 1-2 drops into both eyes daily as needed (dry eyes).   Yes [provider]  polyethylene glycol (MIRALAX / GLYCOLAX) packet Take 17 g by mouth every other day.    Yes [provider]  promethazine (PHENERGAN) 25 MG suppository Place 25 mg rectally every 6 (six) hours as needed for nausea or vomiting.   Yes [provider]  venlafaxine (EFFEXOR) 75 MG tablet Take 1 tablet (75 mg total) by mouth 2 (two) times daily. 10/10/18  Yes Medina-Vargas, Monina C, NP  vitamin C (ASCORBIC ACID) 500 MG tablet Take 500 mg by mouth 2 (two) times daily.    Yes [provider]  acetaminophen (TYLENOL) 325 MG tablet Take 2 tablets (650 mg total) by mouth every 6 (six) hours as needed for headache. For mild pain 09/18/18   Saundra Shelling, MD  cetirizine (ZYRTEC) 10 MG tablet Take 1 tablet (10 mg total) by mouth daily. 10/10/18   Medina-Vargas, Monina C,  NP  Cholecalciferol (VITAMIN D-3) 5000 units TABS Take 5,000 Units by mouth daily.    [provider]  GLUCAGON EMERGENCY 1 MG injection Inject 1 mg into the muscle once. 01/24/19   [provider]  Multiple Vitamins-Minerals (CEROVITE ADVANCED FORMULA PO) Take 1 tablet by mouth daily. Patient not taking: Reported on 05/07/2021 09/18/18   [provider]  senna (SENOKOT) 8.6 MG tablet Take 1 tablet by mouth daily.    [provider]    Allergies Carbocaine [mepivacaine hcl], Hydrocodone, Tussionex pennkinetic er [hydrocod polst-cpm polst er], Baclofen, Codeine, Meperidine, Metoclopramide hcl, and Stadol [butorphanol]  Family History  Problem Relation Age of Onset   Hypertension Mother    Stroke Mother    Heart failure Father     Social History Social History   Tobacco Use   Smoking status: Never   Smokeless tobacco: Never  Vaping Use   Vaping Use: Never used  Substance Use Topics   Alcohol use: No   Drug use: No    Review of Systems  Constitutional: No fever/chills Eyes: No visual changes. ENT: No sore throat. Respiratory: Denies cough Cardiovascular: Denies chest pain Gastrointestinal: Denies abdominal pain Genitourinary: Negative for dysuria. Musculoskeletal: Positive for back pain. Skin: Negative for rash. Psychiatric: no mood changes,     ____________________________________________   PHYSICAL EXAM:  VITAL SIGNS: ED Triage Vitals [05/07/21 1215]  Enc Vitals Group     BP (!) 147/76     Pulse Rate 84     Resp 18     Temp 98.2 F (36.8 C)     Temp Source Oral     SpO2 100 %     Weight      Height      Head Circumference      Peak Flow      Pain Score      Pain Loc      Pain Edu?      Excl. in [place]?     Constitutional: Alert and oriented. Well appearing and in no acute distress. Eyes: Conjunctivae are normal.  Head: Atraumatic. Nose: No congestion/rhinnorhea. Mouth/Throat: Mucous membranes are moist.   Neck:   supple no lymphadenopathy noted Cardiovascular: Normal rate, regular rhythm.  Respiratory:  Normal respiratory effort.  No retractions, lungs c t a, right ribs are tender to palpation Abd: soft nontender bs normal all 4 quad GU: deferred Musculoskeletal: Left elbow is grossly bruised and swollen, tender, C-spine minimally tender, lumbar spine and right ribs are tender to palpation  neurologic:  Normal speech and language.  Skin:  Skin is warm, dry and intact. No rash noted. Psychiatric: Mood and affect are normal. Speech and behavior are normal.  ____________________________________________   LABS (all labs ordered are listed, but only abnormal results are displayed)  Labs Reviewed  RESP PANEL BY RT-PCR (FLU A&B, COVID) ARPGX2  CBC WITH DIFFERENTIAL/PLATELET  COMPREHENSIVE METABOLIC PANEL  URINALYSIS, COMPLETE (UACMP) WITH MICROSCOPIC   ____________________________________________   ____________________________________________  RADIOLOGY  CT of the head, CT of the C-spine, CT lumbar spine, x-ray lumbar spine, x-ray of the right ribs  ____________________________________________   PROCEDURES  Procedure(s) performed:   .Ortho Injury Treatment  Date/Time: 05/07/2021 4:05 PM Performed by: Versie Starks, PA-C Authorized by: Versie Starks, PA-C   Consent:    Consent obtained:  Verbal   Consent given by:  Patient   Risks discussed:  Nerve damage, restricted joint movement, vascular damage, stiffness, recurrent dislocation and irreducible dislocation   Alternatives discussed:  Immobilization and referralInjury location: elbow Location details: left elbow Injury type: fracture Fracture type: olecranon process Pre-procedure neurovascular assessment: neurovascularly intact Pre-procedure distal perfusion: normal Pre-procedure neurological function: normal Pre-procedure range of motion: normal  Anesthesia: Local anesthesia used: no  Patient sedated: NoManipulation  performed: no Immobilization: splint Splint type: long arm Splint Applied by: ED Tech Supplies used: cotton padding, elastic bandage and Ortho-Glass Post-procedure neurovascular assessment: post-procedure neurovascularly intact Post-procedure distal perfusion: normal Post-procedure neurological function: normal Post-procedure range of motion: normal      ____________________________________________   INITIAL IMPRESSION / ASSESSMENT AND PLAN / ED COURSE  Pertinent labs & imaging results that were available during my care of the patient were reviewed by me and considered in my medical decision making (see chart for details).   The patient is a 75 year old female presents emergency department after a fall.  Patient does take Plavix.  See HPI.  Physical exam shows patient is hemodynamically stable at this time.  DDx: Subdural hematoma, SAH, C-spine fracture, lumbar fracture, right rib fracture, left elbow fracture  X-ray of the left elbow does show an olecranon fracture, reviewed by me confirmed by radiology  X-ray of the lumbar spine is negative  CT of the head, C-spine, lumbar spine, and x-ray of the right ribs ordered  Patient was given pain medication through her IV.  CT of the head is negative for any acute abnormality, CT C-spine does not show a fracture, CT lumbar spine shows old fracture but nothing new, x-ray of the right ribs shows a #7 fracture.  Chest x-ray shows possible pneumonia versus effusion.  In discussion with family and the patient, feel that she will need to be admitted as they cannot care for her and she cannot use her walker since she will only be able to use 1 arm.  Consult to Dr. Posey Pronto from orthopedics, states the elbow will have to be surgically repaired.  Will defer to his partners.  Consult to hospitalist  Patient is being admitted in stable condition.  However labs are still pending.  Apologized to the hospitalist that we do not have her lab work  back yet.  Due to the surgical repair needed along with patient's family not being able to care  for her she will need to stay as inpatient  Erin Daniels was evaluated in Emergency Department on 05/07/2021 for the symptoms described in the history of present illness. She was evaluated in the context of the global COVID-19 pandemic, which necessitated consideration that the patient might be at risk for infection with the SARS-CoV-2 virus that causes COVID-19. Institutional protocols and algorithms that pertain to the evaluation of patients at risk for COVID-19 are in a state of rapid change based on information released by regulatory bodies including the CDC and federal and state organizations. These policies and algorithms were followed during the patient's care in the ED.    As part of my medical decision making, I reviewed the following data within the [place] History obtained from family, Nursing notes reviewed and incorporated, Labs reviewed , Old chart reviewed, Radiograph reviewed , Discussed with admitting physician , A consult was requested and obtained from this/these consultant(s) Orthopedics, Notes from prior ED visits, and [place] Controlled Substance Database  ____________________________________________   FINAL CLINICAL IMPRESSION(S) / ED DIAGNOSES  Final diagnoses:  Olecranon fracture, left, closed, initial encounter  Closed fracture of one rib of right side, initial encounter  At risk for inadequate pain control      NEW MEDICATIONS STARTED DURING THIS VISIT:  New Prescriptions   No medications on file     Note:  This document was prepared using Dragon voice recognition software and may include unintentional dictation errors.    [place], [place], PA-C 05/07/21 1621    Harvest Dark, MD 05/08/21 [phone]

## 2021-05-07 NOTE — Progress Notes (Signed)
Full consult note to follow.   Plan for ORIF L olecranon on Thursday, 05/12/21 with Dr. Rudene Christians. Hold Plavix in advance of surgery.

## 2021-05-07 NOTE — H&P (Addendum)
History and Physical    Erin Daniels [id number] DOB: September 11, 1945 DOA: 05/07/2021  PCP: Juluis Pitch, MD   Patient coming from: Home  I have personally briefly reviewed patient's old medical records in [place]  Chief Complaint: Left elbow pain/right lateral chest wall pain  HPI: Erin Daniels is a 75 y.o. female with medical history significant for insulin-dependent diabetes mellitus, history of CVA, diabetic retinopathy, hypertension and hypothyroidism who was brought into the ER by her husband for evaluation following a fall the night prior to her admission. Patient was started on antibiotic therapy 2 days prior to this hospitalization for UTI and was also placed on tramadol for pain control.  At baseline she usually ambulates with a rolling walker and according to the husband she had gone to use the bathroom and on her way out forgot to take her walker and fell landing on her left elbow.  He was able to assist her up but noticed significant swelling involving the left elbow and then patient started complaining of pain in the right lateral chest wall.  She rated her pain a 6 to 7 x 10 in intensity at its worst and left elbow pain is worse with any form of movement activity. She complains of urinary frequency. She denies having any cough, no fever, no chills, no chest pain, no shortness of breath, no headache, no dizziness, no lightheadedness, no palpitations, no diaphoresis, no lower extremity swelling, no changes in her bowel habits, no blurred vision no focal deficit. Labs not available during this documentation Lumbar spine x-ray shows degenerative changes as above. No fracture or traumatic malalignment. Fecal loading in the visualized colon. Left elbow x-ray shows olecranon process fracture as above with resulting soft tissue swelling and a joint effusion. CT scan of lumbar spine shows no acute fracture is identified. Chronic kyphotic angulation at the upper S2  level compatible with an old fracture that was also shown on 09/16/2018. Lumbar spondylosis, scoliosis, congenitally short pedicles, and degenerative disc disease, causing prominent impingement at L4-5,moderate impingement at L1-2 and L3-4, and mild impingement at L2-3, as detailed above.Aortic Atherosclerosis  Prominent stool throughout the colon favors constipation. CT cervical spine stable cervical spondylosis and degenerative disc disease causing multilevel impingement.4 mm of chronic degenerative anterolisthesis at C3-4, unchanged from 04/30/2019. Atherosclerosis. CT scan of the head without contrast no acute intracranial findings. Periventricular white matter and corona radiata hypo densities favor chronic ischemic microvascular white matter disease. Remote lacunar infarcts including involvement of the left caudate head,genu of the left internal capsule, and bilateral thalami. Right rib series and chest x-ray shows possible nondisplaced fracture of the right lateral seventh rib.Correlate with point tenderness.Patchy opacities in the right lung and suspected trace right pleural effusion could reflect pneumonia and a para pneumonic effusion in the correct clinical setting. Recommend follow-up radiographs in 6-8 weeks to ensure resolution.     ED Course: Patient is a 75 year old female who presents to the ER for evaluation following a fall with complaints of left elbow pain and right lateral chest wall pain. She was recently started on antibiotic therapy for urinary tract infection. Imaging shows a left olecranon fracture as well as the right seventh rib fracture. Patient ambulates with a rolling walker and is unable to do that due to pain in her left elbow which increases high risk for further falls. She will be referred to observation status for further evaluation.   Review of Systems: As per HPI otherwise all other systems reviewed and  negative.    Past Medical History:  Diagnosis Date    Angina pectoris ([place])    At high risk for falls    Cancer [facility])    Carpal tunnel syndrome    BILATERAL   Cervicalgia    Complication of anesthesia    difficult to arouse   Diabetes mellitus without complication (HCC)    Type I   Diabetic neuropathy (HCC)    Diabetic neuropathy (HCC)    Diabetic retinopathy ([place])    Diabetic retinopathy ([place])    Dizziness    Gastroparesis    Headache    MIGRAINE   Herniated disc, cervical    Hypercholesteremia    Hypertension    Hypokalemia    Hypotensive episode    Hypothyroidism    Insulin pump in place    Myofascial muscle pain    Numbness of arm    left   Osteoporosis    Poor vision    Rosacea    Seizures (HCC)    Slurred speech    Stroke (cerebrum) ([place])    4/19   Stroke ([place])    x5   Vertigo    Wears glasses     Past Surgical History:  Procedure Laterality Date   APPENDECTOMY     AUGMENTATION MAMMAPLASTY Bilateral 2011   BILATERAL CARPAL TUNNEL RELEASE     BITRECTOMY     MEMBRAIN PEEL EYE   COLONOSCOPY     COLONOSCOPY WITH PROPOFOL N/A 03/17/2020   Procedure: COLONOSCOPY WITH PROPOFOL;  Surgeon: Toledo, Benay Pike, MD;  Location: [facility] ENDOSCOPY;  Service: Gastroenterology;  Laterality: N/A;   EYE SURGERY Bilateral    cataract   FRACTURE SURGERY     HEMORROIDECTOMY     HERNIA REPAIR     umbilical   INTRAMEDULLARY (IM) NAIL INTERTROCHANTERIC Right 12/07/2018   Procedure: INTRAMEDULLARY (IM) NAIL INTERTROCHANTRIC;  Surgeon: Thornton Park, MD;  Location: [facility] ORS;  Service: Orthopedics;  Laterality: Right;   JOINT REPLACEMENT     right hip   OPEN REDUCTION INTERNAL FIXATION (ORIF) DISTAL RADIAL FRACTURE Right 05/29/2015   Procedure: OPEN REDUCTION INTERNAL FIXATION (ORIF) RIGHT DISTAL RADIUS FRACTURE WITH ALLOGRAFT BONE GRAFTING FOR REPAIR AND RECONSTRUCTION;  Surgeon: Roseanne Kaufman, MD;  Location: [place];  Service: Orthopedics;  Laterality: Right;   OPEN REDUCTION INTERNAL FIXATION (ORIF) METACARPAL Right 01/09/2019    Procedure: OPEN REDUCTION INTERNAL FIXATION (ORIF) RIGHT SECOND METACARPAL FRACTURE;  Surgeon: Corky Mull, MD;  Location: [facility] ORS;  Service: Orthopedics;  Laterality: Right;   SHOULDER ARTHROSCOPY Left    SHOULDER HEMI-ARTHROPLASTY Left    SYNOVECTOMY     TONSILLECTOMY     TRIGGER FINGER RELEASE     TUBAL LIGATION       reports that she has never smoked. She has never used smokeless tobacco. She reports that she does not drink alcohol and does not use drugs.  Allergies  Allergen Reactions   Carbocaine [Mepivacaine Hcl] Palpitations    Low bp   Hydrocodone Other (See Comments)    Diabetic/  High sugars   Tussionex Pennkinetic Er [Hydrocod Polst-Cpm Polst Er] Other (See Comments)    Diabetic/  High sugars   Baclofen     confusion   Codeine Nausea Only and Other (See Comments)    Reaction: stress attacks/ headache and nausea Can take Vicodin and Percocet    Meperidine Nausea Only and Other (See Comments)    Reaction: stress attacks/ headache and nausea   Metoclopramide Hcl Nausea Only and  Other (See Comments)    Reaction: stress attacks/ headache    Stadol [Butorphanol] Nausea Only and Other (See Comments)    Reaction: stress attacks/ headache    Family History  Problem Relation Age of Onset   Hypertension Mother    Stroke Mother    Heart failure Father       Prior to Admission medications   Medication Sig Start Date End Date Taking? Authorizing Provider  acetaminophen (TYLENOL) 325 MG tablet Take 2 tablets (650 mg total) by mouth every 6 (six) hours as needed for headache. For mild pain 09/18/18   Saundra Shelling, MD  aspirin EC 81 MG tablet Take 81 mg by mouth daily.    [provider]  brimonidine-timolol (COMBIGAN) 0.2-0.5 % ophthalmic solution Place 1 drop into the right eye 3 (three) times daily. Instill one drop in right eye morning, noon and bedtime     [provider]  butalbital-acetaminophen-caffeine (FIORICET) 50-325-40 MG tablet Take 1  tablet by mouth 2 (two) times daily as needed for headache or migraine. 04/15/20   Gillis Santa, MD  Calcium Carb-Cholecalciferol (CALCIUM 600 + D) 600-200 MG-UNIT TABS Take 1 tablet by mouth daily with supper.     [provider]  cetirizine (ZYRTEC) 10 MG tablet Take 1 tablet (10 mg total) by mouth daily. 10/10/18   Medina-Vargas, Monina C, NP  Cholecalciferol (VITAMIN D-3) 5000 units TABS Take 5,000 Units by mouth daily.    [provider]  clopidogrel (PLAVIX) 75 MG tablet Take 75 mg by mouth daily with supper.    [provider]  ferrous sulfate 325 (65 FE) MG tablet Take 325 mg by mouth daily with supper.     [provider]  gabapentin (NEURONTIN) 300 MG capsule 300 mg in the morning, 300 mg in the afternoon, 900 mg in the evening Patient taking differently: Take 300-900 mg by mouth See admin instructions. Take 1 capsule ('300mg'$ ) by mouth every morning, 1 capsule ('300mg'$ ) by mouth at lunch and take 3 capsules ('900mg'$ ) by mouth every night at bedtime 10/10/18   Medina-Vargas, Monina C, NP  GLUCAGON EMERGENCY 1 MG injection Inject 1 mg into the muscle once. 01/24/19   [provider]  glucose 4 GM chewable tablet Chew 1 tablet (4 g total) by mouth as needed for low blood sugar. <65 10/10/18   Medina-Vargas, Monina C, NP  insulin aspart (NOVOLOG) 100 UNIT/ML injection Inject into the skin continuous. (insulin pump averages about 20u over 24 hours)    [provider]  losartan (COZAAR) 50 MG tablet Take 50 mg by mouth daily.    [provider]  Multiple Vitamins-Minerals (CEROVITE ADVANCED FORMULA PO) Take 1 tablet by mouth daily. 09/18/18   [provider]  Netarsudil-Latanoprost (ROCKLATAN) 0.02-0.005 % SOLN Place 1 drop into the right eye at bedtime.     [provider]  Polyethyl Glycol-Propyl Glycol (SYSTANE ULTRA) 0.4-0.3 % SOLN Place 1-2 drops into both eyes daily as needed (dry eyes).    [provider]   polyethylene glycol (MIRALAX / GLYCOLAX) packet Take 17 g by mouth every other day.     [provider]  promethazine (PHENERGAN) 25 MG suppository Place 25 mg rectally every 6 (six) hours as needed for nausea or vomiting.    [provider]  senna (SENOKOT) 8.6 MG tablet Take 1 tablet by mouth daily.    [provider]  venlafaxine (EFFEXOR) 75 MG tablet Take 1 tablet (75 mg total) by mouth  2 (two) times daily. 10/10/18   Medina-Vargas, Monina C, NP  vitamin C (ASCORBIC ACID) 500 MG tablet Take 500 mg by mouth 2 (two) times daily.     [provider]    Physical Exam: Vitals:   05/07/21 1215 05/07/21 1523  BP: (!) 147/76 (!) 163/91  Pulse: 84 84  Resp: 18 16  Temp: 98.2 F (36.8 C)   TempSrc: Oral   SpO2: 100% 97%     Vitals:   05/07/21 1215 05/07/21 1523  BP: (!) 147/76 (!) 163/91  Pulse: 84 84  Resp: 18 16  Temp: 98.2 F (36.8 C)   TempSrc: Oral   SpO2: 100% 97%      Constitutional: Alert and oriented x 3 . Not in any apparent distress.  Appears frail HEENT:      Head: Normocephalic and atraumatic.         Eyes: PERLA, EOMI, Conjunctivae are normal. Sclera is non-icteric.       Mouth/Throat: Mucous membranes are moist.       Neck: Supple with no signs of meningismus. Cardiovascular: Regular rate and rhythm. No murmurs, gallops, or rubs. 2+ symmetrical distal pulses are present . No JVD. No LE edema Respiratory: Respiratory effort normal .Lungs sounds clear bilaterally. No wheezes, crackles, or rhonchi.  Gastrointestinal: Soft, non tender, and non distended with positive bowel sounds.  Genitourinary: No CVA tenderness. Musculoskeletal: Decreased range of motion left forearm.  Left elbow in a cast.  No cyanosis, or erythema of extremities. Neurologic:  Face is symmetric. Moving all extremities. No gross focal neurologic deficits . Skin: Skin is warm, dry.  No rash or ulcers Psychiatric: Mood and affect are normal    Labs on  Admission: I have personally reviewed following labs and imaging studies  CBC: Recent Labs  Lab 05/07/21 1523  WBC 8.4  NEUTROABS 6.4  HGB 10.6*  HCT 31.3*  MCV 94.6  PLT [id number]   Basic Metabolic Panel: No results for input(s): NA, K, CL, CO2, GLUCOSE, BUN, CREATININE, CALCIUM, MG, PHOS in the last 168 hours. GFR: CrCl cannot be calculated (Patient's most recent lab result is older than the maximum 21 days allowed.). Liver Function Tests: No results for input(s): AST, ALT, ALKPHOS, BILITOT, PROT, ALBUMIN in the last 168 hours. No results for input(s): LIPASE, AMYLASE in the last 168 hours. No results for input(s): AMMONIA in the last 168 hours. Coagulation Profile: No results for input(s): INR, PROTIME in the last 168 hours. Cardiac Enzymes: No results for input(s): CKTOTAL, CKMB, CKMBINDEX, TROPONINI in the last 168 hours. BNP (last 3 results) No results for input(s): PROBNP in the last 8760 hours. HbA1C: No results for input(s): HGBA1C in the last 72 hours. CBG: No results for input(s): GLUCAP in the last 168 hours. Lipid Profile: No results for input(s): CHOL, HDL, LDLCALC, TRIG, CHOLHDL, LDLDIRECT in the last 72 hours. Thyroid Function Tests: No results for input(s): TSH, T4TOTAL, FREET4, T3FREE, THYROIDAB in the last 72 hours. Anemia Panel: No results for input(s): VITAMINB12, FOLATE, FERRITIN, TIBC, IRON, RETICCTPCT in the last 72 hours. Urine analysis:    Component Value Date/Time   COLORURINE AMBER (A) 11/20/2019 2224   APPEARANCEUR HAZY (A) 11/20/2019 2224   APPEARANCEUR Clear 10/19/2012 1628   LABSPEC 1.020 11/20/2019 2224   LABSPEC 1.017 10/19/2012 1628   PHURINE 5.0 11/20/2019 2224   GLUCOSEU 150 (A) 11/20/2019 2224   GLUCOSEU >=500 10/19/2012 1628   HGBUR NEGATIVE 11/20/2019 [place] NEGATIVE 11/20/2019 2224  BILIRUBINUR Negative 10/19/2012 1628   KETONESUR NEGATIVE 11/20/2019 2224   PROTEINUR NEGATIVE 11/20/2019 2224   NITRITE NEGATIVE  11/20/2019 2224   LEUKOCYTESUR SMALL (A) 11/20/2019 2224   LEUKOCYTESUR Negative 10/19/2012 1628    Radiological Exams on Admission: DG Ribs Unilateral W/Chest Right  Result Date: 05/07/2021 CLINICAL DATA:  Pain following fall EXAM: RIGHT RIBS AND CHEST - 3+ VIEW COMPARISON:  Chest radiograph 11/20/2019 FINDINGS: The cardiomediastinal silhouette is stable. There is asymmetric elevation of the right hemidiaphragm. There are patchy opacities throughout the right lung not present on the prior study from 2021. There is is a suspected trace right pleural effusion. The left lung is clear. There is no pneumothorax. There is a possible nondisplaced fracture of the right lateral seventh rib. The previously seen left rib fractures are not identified. Left shoulder arthroplasty hardware is again noted. IMPRESSION: 1. Possible nondisplaced fracture of the right lateral seventh rib. Correlate with point tenderness. 2. Patchy opacities in the right lung and suspected trace right pleural effusion could reflect pneumonia and a parapneumonic effusion in the correct clinical setting. Recommend follow-up radiographs in 6-8 weeks to ensure resolution. Electronically Signed   By: Valetta Mole M.D.   On: 05/07/2021 15:13   DG Lumbar Spine Complete  Result Date: 05/07/2021 CLINICAL DATA:  Low back pain.  Fall. EXAM: LUMBAR SPINE - COMPLETE 4+ VIEW COMPARISON:  None. FINDINGS: Fecal loading in visualized portions of the colon. Scoliotic curvature of the lumbar spine, apex of the) no other malalignment. Multilevel degenerative disc disease most prominent at L4-5. Vacuum disc phenomena at L4-5. No fractures are seen. Lower lumbar facet degenerative changes are identified. IMPRESSION: Degenerative changes as above. No fracture or traumatic malalignment. Fecal loading in the visualized colon. Electronically Signed   By: Dorise Bullion III M.D.   On: 05/07/2021 13:11   DG Elbow Complete Left  Result Date: 05/07/2021 CLINICAL  DATA:  Pain after fall EXAM: LEFT ELBOW - COMPLETE 3+ VIEW COMPARISON:  None. FINDINGS: Significant soft tissue swelling. Joint effusion. Fracture through the base of the olecranon process with displacement. The fracture is comminuted with 1 dominant fracture fragment and numerous tiny fracture fragments. No other abnormalities. IMPRESSION: Olecranon process fracture as above with resulting soft tissue swelling and a joint effusion. Electronically Signed   By: Dorise Bullion III M.D.   On: 05/07/2021 13:13   CT HEAD WO CONTRAST (5MM)  Result Date: 05/07/2021 CLINICAL DATA:  Head trauma, fall, anticoagulation (on Plavix). EXAM: CT HEAD WITHOUT CONTRAST TECHNIQUE: Contiguous axial images were obtained from the base of the skull through the vertex without intravenous contrast. COMPARISON:  08/11/2019 FINDINGS: Brain: Small remote lacunar infarcts near the genu of the left internal capsule and in the bilateral thalamus. Periventricular white matter and corona radiata hypodensities favor chronic ischemic microvascular white matter disease. Confluent hypodensity in the left periventricular white matter on image 19 of series 2 is mildly increased from 08/11/2019 but likely a manifestation of chronic ischemic process. Small remote lacunar infarct of the head of the left caudate nucleus on image 17 series 2. Otherwise, the brainstem, cerebellum, cerebral peduncles, thalamus, basal ganglia, basilar cisterns, and ventricular system appear within normal limits. No intracranial hemorrhage, mass lesion, or acute CVA. Vascular: There is atherosclerotic calcification of the cavernous carotid arteries bilaterally. Skull: Unremarkable Sinuses/Orbits: Unremarkable Other: No supplemental non-categorized findings. IMPRESSION: 1. No acute intracranial findings. 2. Periventricular white matter and corona radiata hypodensities favor chronic ischemic microvascular white matter disease. Remote lacunar infarcts including involvement  of  the left caudate head, genu of the left internal capsule, and bilateral thalami. Electronically Signed   By: Van Clines M.D.   On: 05/07/2021 15:11   CT Cervical Spine Wo Contrast  Result Date: 05/07/2021 CLINICAL DATA:  Neck trauma, fall EXAM: CT CERVICAL SPINE WITHOUT CONTRAST TECHNIQUE: Multidetector CT imaging of the cervical spine was performed without intravenous contrast. Multiplanar CT image reconstructions were also generated. COMPARISON:  04/30/2019 CT scan FINDINGS: Alignment: 4 mm of chronic anterolisthesis at C3-4, unchanged from 04/30/2019. Endplate sclerosis and loss of disc height at all levels between C3 and C7. Anterior spurring and loss of space at the anterior C1-2 articulation. Skull base and vertebrae: No fracture or acute bony finding identified. Soft tissues and spinal canal: Atherosclerotic calcification of the common carotid arteries and vertebral arteries. Disc levels: Uncinate and facet spurring lead to mild foraminal impingement on the right at C3-4, C4-5, and C5-6; and moderate left foraminal impingement at C4-5 and C5-6. Suspected central narrowing of the thecal sac at C3-4, C4-5, and C5-6 due to posterior osseous ridging. Upper chest: Unremarkable Other: No supplemental non-categorized findings. IMPRESSION: 1. Stable cervical spondylosis and degenerative disc disease causing multilevel impingement. 2. 4 mm of chronic degenerative anterolisthesis at C3-4, unchanged from 04/30/2019. 3. Atherosclerosis. Electronically Signed   By: Van Clines M.D.   On: 05/07/2021 15:04   CT Lumbar Spine Wo Contrast  Result Date: 05/07/2021 CLINICAL DATA:  Low back pain, trauma.  Fall. EXAM: CT LUMBAR SPINE WITHOUT CONTRAST TECHNIQUE: Multidetector CT imaging of the lumbar spine was performed without intravenous contrast administration. Multiplanar CT image reconstructions were also generated. COMPARISON:  Lumbar spine radiograph 05/07/2021 and lumbar MRI from 09/16/2018 FINDINGS:  Segmentation: The lowest lumbar type non-rib-bearing vertebra is labeled as L5. Alignment: Substantial dextroconvex lumbar scoliosis with rotary component. 3 mm degenerative retrolisthesis at L1-2. Vertebrae: Chronic kyphotic angulation at the upper S2 level, as shown on 09/16/2018, compatible with an old fracture. Sclerotic degenerative endplate findings eccentric to the left at L2-3 and eccentric to the right at L4-5 with loss of disc height and vacuum disc phenomenon. Paraspinal and other soft tissues: Atherosclerosis is present, including aortoiliac atherosclerotic disease. Prominence of stool in the visualized colon. Disc levels: T12-L1: Unremarkable. L1-2: Moderate left foraminal stenosis due to facet spurring. L2-3: Mild left foraminal stenosis and borderline central narrowing of the thecal sac due to intervertebral spurring, disc bulge, and facet spurring. L3-4: There at least moderate central narrowing of the thecal sac due to disc bulge at this level. Bilateral facet arthropathy and left ligamentum flavum redundancy. L4-5: Prominent central narrowing of the thecal sac with moderate right foraminal stenosis due to disc bulge and potential right lateral recess disc protrusion along with congenitally short pedicles and facet arthropathy. Substantial impingement was also present at this level on 09/16/2018. L5-S1: No impingement.  Disc bulge. IMPRESSION: 1. No acute fracture is identified. Chronic kyphotic angulation at the upper S2 level compatible with an old fracture that was also shown on 09/16/2018. 2. Lumbar spondylosis, scoliosis, congenitally short pedicles, and degenerative disc disease, causing prominent impingement at L4-5, moderate impingement at L1-2 and L3-4, and mild impingement at L2-3, as detailed above. 3.  Aortic Atherosclerosis (ICD10-I70.0). 4.  Prominent stool throughout the colon favors constipation. Electronically Signed   By: Van Clines M.D.   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   Status post fall With left olecranon fracture as well as fracture of seventh rib on the right side Pain control Fall precautions Incentive spirometry for rib fracture Patient usually ambulates with a rolling walker and is unable to do that at this time due to severe pain in her left elbow.  This increases her risk for further fractures. We will request PT evaluation      Type 1 diabetes mellitus with complications of neuropathy, proliferative retinopathy gastroparesis We will continue patient's insulin pump during this hospitalization Maintain consistent carbohydrate diet Check blood sugars with meals and at bedtime     UTI Labs reviewed from patient's last primary care provider's appointment on 05/05/21 Patient has pyuria and urine culture results are not available Will place patient empirically on Rocephin 1 g IV daily     Hypertension Continue Cozaar    History of CVA Continue aspirin and Plavix    Depression Continue Effexor  DVT prophylaxis: SCD  Code Status: full code  Family Communication: Greater than 50% of time was spent discussing patient's condition and plan of care with patient and her husband at the bedside.  All questions and concerns have been addressed.  They verbalized understanding and agree with the plan. Disposition Plan: Back to previous home environment Consults called: Orthopedic surgery Status:  Observation    Anaiah Mcmannis MD [facility]     05/07/2021, 4:27 PM

## 2021-05-07 NOTE — ED Triage Notes (Signed)
Pt in after trip fall yesterday, landing onto L elbow and R low back. Notable bruises present, takes Plavix, did not hit head. Currently taking abx for ongoing UTI, 3 days in. Feels better with urinary symptoms. Here today for the extremity injuries d/t yesterday's fall

## 2021-05-07 NOTE — ED Notes (Signed)
COVID swab sent to lab.

## 2021-05-07 NOTE — Progress Notes (Signed)
Patient admitted with insulin pump. FSBS was checked. According to patient her isulin pump is managed by her husband. She states that she does not know how to manage it. Dr. Damita Dunnings was notified. States that we will hold off on the pump for now and she has ordered coverage.

## 2021-05-07 NOTE — Consult Note (Signed)
ORTHOPAEDIC CONSULTATION  REQUESTING PHYSICIAN: Collier Bullock, MD  Chief Complaint:   L elbow pain  History of Present Illness: Erin Daniels is a 75 y.o. female who is right-hand dominant and sustained a fall yesterday. She ambulates with a walker at baseline, but attempted to navigate her small bathroom without the walker, resulting in the fall onto her L elbow. She noted immediate pain and swelling. Radiographs in the ED show a displaced L olecranon fracture.   Of note, she also has a likely R 7th rib fracture. She is also being treated with antibiotics for a UTI. She has a medical history significant for CVA on Plavix as well as diabetes, hypertension, and hypothyroidism.  Past Medical History:  Diagnosis Date   Angina pectoris ([place])    At high risk for falls    Cancer [facility])    Carpal tunnel syndrome    BILATERAL   Cervicalgia    Complication of anesthesia    difficult to arouse   Diabetes mellitus without complication (HCC)    Type I   Diabetic neuropathy (HCC)    Diabetic neuropathy (HCC)    Diabetic retinopathy ([place])    Diabetic retinopathy ([place])    Dizziness    Gastroparesis    Headache    MIGRAINE   Herniated disc, cervical    Hypercholesteremia    Hypertension    Hypokalemia    Hypotensive episode    Hypothyroidism    Insulin pump in place    Myofascial muscle pain    Numbness of arm    left   Osteoporosis    Poor vision    Rosacea    Seizures (HCC)    Slurred speech    Stroke (cerebrum) ([place])    4/19   Stroke ([place])    x5   Vertigo    Wears glasses    Past Surgical History:  Procedure Laterality Date   APPENDECTOMY     AUGMENTATION MAMMAPLASTY Bilateral 2011   BILATERAL CARPAL TUNNEL RELEASE     BITRECTOMY     MEMBRAIN PEEL EYE   COLONOSCOPY     COLONOSCOPY WITH PROPOFOL N/A 03/17/2020   Procedure: COLONOSCOPY WITH PROPOFOL;  Surgeon: Toledo, Benay Pike, MD;  Location: [facility]  ENDOSCOPY;  Service: Gastroenterology;  Laterality: N/A;   EYE SURGERY Bilateral    cataract   FRACTURE SURGERY     HEMORROIDECTOMY     HERNIA REPAIR     umbilical   INTRAMEDULLARY (IM) NAIL INTERTROCHANTERIC Right 12/07/2018   Procedure: INTRAMEDULLARY (IM) NAIL INTERTROCHANTRIC;  Surgeon: Thornton Park, MD;  Location: [facility] ORS;  Service: Orthopedics;  Laterality: Right;   JOINT REPLACEMENT     right hip   OPEN REDUCTION INTERNAL FIXATION (ORIF) DISTAL RADIAL FRACTURE Right 05/29/2015   Procedure: OPEN REDUCTION INTERNAL FIXATION (ORIF) RIGHT DISTAL RADIUS FRACTURE WITH ALLOGRAFT BONE GRAFTING FOR REPAIR AND RECONSTRUCTION;  Surgeon: Roseanne Kaufman, MD;  Location: [place];  Service: Orthopedics;  Laterality: Right;   OPEN REDUCTION INTERNAL FIXATION (ORIF) METACARPAL Right 01/09/2019   Procedure: OPEN REDUCTION INTERNAL FIXATION (ORIF) RIGHT SECOND METACARPAL FRACTURE;  Surgeon: Corky Mull, MD;  Location: [facility] ORS;  Service: Orthopedics;  Laterality: Right;   SHOULDER ARTHROSCOPY Left    SHOULDER HEMI-ARTHROPLASTY Left    SYNOVECTOMY     TONSILLECTOMY     TRIGGER FINGER RELEASE     TUBAL LIGATION     Social History   Socioeconomic History   Marital status: Married    Spouse name: Not on  file   Number of children: Not on file   Years of education: Not on file   Highest education level: Not on file  Occupational History   Not on file  Tobacco Use   Smoking status: Never   Smokeless tobacco: Never  Vaping Use   Vaping Use: Never used  Substance and Sexual Activity   Alcohol use: No   Drug use: No   Sexual activity: Not on file  Other Topics Concern   Not on file  Social History Narrative   Lives at home with her husband.  Unsteady gait.  Walks without any assistive device at this time   Social Determinants of Radio broadcast assistant Strain: Not on file  Food Insecurity: Not on file  Transportation Needs: Not on file  Physical Activity: Not on file  Stress: Not  on file  Social Connections: Not on file   Family History  Problem Relation Age of Onset   Hypertension Mother    Stroke Mother    Heart failure Father    Allergies  Allergen Reactions   Carbocaine [Mepivacaine Hcl] Palpitations    Low bp   Hydrocodone Other (See Comments)    Diabetic/  High sugars   Tussionex Pennkinetic Er [Hydrocod Polst-Cpm Polst Er] Other (See Comments)    Diabetic/  High sugars   Baclofen     confusion   Codeine Nausea Only and Other (See Comments)    Reaction: stress attacks/ headache and nausea Can take Vicodin and Percocet    Meperidine Nausea Only and Other (See Comments)    Reaction: stress attacks/ headache and nausea   Metoclopramide Hcl Nausea Only and Other (See Comments)    Reaction: stress attacks/ headache    Stadol [Butorphanol] Nausea Only and Other (See Comments)    Reaction: stress attacks/ headache   Prior to Admission medications   Medication Sig Start Date End Date Taking? Authorizing Provider  acetaminophen (TYLENOL) 325 MG tablet Take 2 tablets (650 mg total) by mouth every 6 (six) hours as needed for headache. For mild pain 09/18/18  Yes Pyreddy, Reatha Harps, MD  amoxicillin-clavulanate (AUGMENTIN) 875-125 MG tablet Take 1 tablet by mouth 2 (two) times daily. 05/05/21  Yes [provider]  aspirin EC 81 MG tablet Take 81 mg by mouth daily.   Yes [provider]  atorvastatin (LIPITOR) 40 MG tablet Take 40 mg by mouth daily. 04/13/21  Yes [provider]  brimonidine-timolol (COMBIGAN) 0.2-0.5 % ophthalmic solution Place 1 drop into the right eye 3 (three) times daily. Instill one drop in right eye morning, noon and bedtime    Yes [provider]  butalbital-acetaminophen-caffeine (FIORICET) 50-325-40 MG tablet Take 1 tablet by mouth 2 (two) times daily as needed for headache or migraine. Patient taking differently: Take 0.5-3 tablets by mouth daily as needed for headache or migraine. 04/15/20  Yes Gillis Santa, MD  Calcium Carb-Cholecalciferol 600-200 MG-UNIT TABS Take 1 tablet by mouth daily with supper.    Yes [provider]  cetirizine (ZYRTEC) 10 MG tablet Take 1 tablet (10 mg total) by mouth daily. 10/10/18  Yes Medina-Vargas, Monina C, NP  Cholecalciferol (VITAMIN D-3) 5000 units TABS Take 5,000 Units by mouth daily.   Yes [provider]  clopidogrel (PLAVIX) 75 MG tablet Take 75 mg by mouth daily with supper.   Yes [provider]  ferrous sulfate 325 (65 FE) MG tablet Take 325 mg by mouth daily with supper.    Yes [provider]  gabapentin (NEURONTIN) 300 MG capsule 300 mg in the morning, 300 mg in the afternoon, 900 mg in the evening Patient taking differently: Take 300-900 mg by mouth See admin instructions. Take 1 capsule ('300mg'$ ) by mouth every morning, 1 capsule ('300mg'$ ) by mouth at lunch and take 3 capsules ('900mg'$ ) by mouth every night at bedtime 10/10/18  Yes Medina-Vargas, Monina C, NP  GLUCAGON EMERGENCY 1 MG injection Inject 1 mg into the muscle once. 01/24/19  Yes [provider]  glucose 4 GM chewable tablet Chew 1 tablet (4 g total) by mouth as needed for low blood sugar. <65 10/10/18  Yes Medina-Vargas, Monina C, NP  insulin aspart (NOVOLOG) 100 UNIT/ML injection Inject into the skin continuous. (insulin pump averages about 20u over 24 hours)   Yes [provider]  Insulin Disposable Pump (OMNIPOD CLASSIC PODS, GEN 3,) MISC Inject 1 Piece into the skin every 3 (three) days. 04/18/21  Yes [provider]  losartan (COZAAR) 50 MG tablet Take 50 mg by mouth daily.   Yes [provider]  Multiple Vitamin (MULTIVITAMIN WITH MINERALS) TABS tablet Take 1 tablet by mouth daily.   Yes [provider]  Netarsudil-Latanoprost 0.02-0.005 % SOLN Place 1 drop into the right eye at bedtime.    Yes [provider]  polyethylene glycol (MIRALAX / GLYCOLAX) packet Take 17 g by mouth every other day.    Yes  [provider]  promethazine (PHENERGAN) 25 MG suppository Place 25 mg rectally every 6 (six) hours as needed for nausea or vomiting.   Yes [provider]  traMADol (ULTRAM) 50 MG tablet Take 50 mg by mouth every 6 (six) hours as needed. 05/05/21  Yes [provider]  venlafaxine (EFFEXOR) 75 MG tablet Take 1 tablet (75 mg total) by mouth 2 (two) times daily. 10/10/18  Yes Medina-Vargas, Monina C, NP  vitamin C (ASCORBIC ACID) 500 MG tablet Take 500 mg by mouth 2 (two) times daily.    Yes [provider]  VYZULTA 0.024 % SOLN Place 1 drop into the right eye daily. 02/05/21  Yes [provider]  carboxymethylcellulose (REFRESH PLUS) 0.5 % SOLN Place 1 drop into the right eye daily as needed. Patient not taking: Reported on 05/07/2021    [provider]  Multiple Vitamins-Minerals (CEROVITE ADVANCED FORMULA PO) Take 1 tablet by mouth daily. Patient not taking: Reported on 05/07/2021 09/18/18   [provider]  Polyethyl Glycol-Propyl Glycol (SYSTANE ULTRA) 0.4-0.3 % SOLN Place 1-2 drops into both eyes daily as needed (dry eyes). Patient not taking: Reported on 05/07/2021    [provider]  senna (SENOKOT) 8.6 MG tablet Take 1 tablet by mouth daily. Patient not taking: Reported on 05/07/2021    [provider]   Recent Labs    05/07/21 1523  WBC 8.4  HGB 10.6*  HCT 31.3*  PLT 248  K 4.4  CL 99  CO2 27  BUN 16  CREATININE 0.68  GLUCOSE 223*  CALCIUM 8.8*   DG Ribs Unilateral W/Chest Right  Result Date: 05/07/2021 CLINICAL DATA:  Pain following fall EXAM: RIGHT RIBS AND CHEST - 3+ VIEW COMPARISON:  Chest radiograph 11/20/2019 FINDINGS: The cardiomediastinal silhouette is stable. There is asymmetric elevation of the right hemidiaphragm. There are patchy opacities throughout the right lung not present on the prior study from 2021. There is is a suspected trace right pleural effusion. The left lung is clear. There is  no pneumothorax. There is a possible  nondisplaced fracture of the right lateral seventh rib. The previously seen left rib fractures are not identified. Left shoulder arthroplasty hardware is again noted. IMPRESSION: 1. Possible nondisplaced fracture of the right lateral seventh rib. Correlate with point tenderness. 2. Patchy opacities in the right lung and suspected trace right pleural effusion could reflect pneumonia and a parapneumonic effusion in the correct clinical setting. Recommend follow-up radiographs in 6-8 weeks to ensure resolution. Electronically Signed   By: Valetta Mole M.D.   On: 05/07/2021 15:13   DG Lumbar Spine Complete  Result Date: 05/07/2021 CLINICAL DATA:  Low back pain.  Fall. EXAM: LUMBAR SPINE - COMPLETE 4+ VIEW COMPARISON:  None. FINDINGS: Fecal loading in visualized portions of the colon. Scoliotic curvature of the lumbar spine, apex of the) no other malalignment. Multilevel degenerative disc disease most prominent at L4-5. Vacuum disc phenomena at L4-5. No fractures are seen. Lower lumbar facet degenerative changes are identified. IMPRESSION: Degenerative changes as above. No fracture or traumatic malalignment. Fecal loading in the visualized colon. Electronically Signed   By: Dorise Bullion III M.D.   On: 05/07/2021 13:11   DG Elbow Complete Left  Result Date: 05/07/2021 CLINICAL DATA:  Pain after fall EXAM: LEFT ELBOW - COMPLETE 3+ VIEW COMPARISON:  None. FINDINGS: Significant soft tissue swelling. Joint effusion. Fracture through the base of the olecranon process with displacement. The fracture is comminuted with 1 dominant fracture fragment and numerous tiny fracture fragments. No other abnormalities. IMPRESSION: Olecranon process fracture as above with resulting soft tissue swelling and a joint effusion. Electronically Signed   By: Dorise Bullion III M.D.   On: 05/07/2021 13:13   CT HEAD WO CONTRAST (5MM)  Result Date: 05/07/2021 CLINICAL DATA:  Head trauma, fall,  anticoagulation (on Plavix). EXAM: CT HEAD WITHOUT CONTRAST TECHNIQUE: Contiguous axial images were obtained from the base of the skull through the vertex without intravenous contrast. COMPARISON:  08/11/2019 FINDINGS: Brain: Small remote lacunar infarcts near the genu of the left internal capsule and in the bilateral thalamus. Periventricular white matter and corona radiata hypodensities favor chronic ischemic microvascular white matter disease. Confluent hypodensity in the left periventricular white matter on image 19 of series 2 is mildly increased from 08/11/2019 but likely a manifestation of chronic ischemic process. Small remote lacunar infarct of the head of the left caudate nucleus on image 17 series 2. Otherwise, the brainstem, cerebellum, cerebral peduncles, thalamus, basal ganglia, basilar cisterns, and ventricular system appear within normal limits. No intracranial hemorrhage, mass lesion, or acute CVA. Vascular: There is atherosclerotic calcification of the cavernous carotid arteries bilaterally. Skull: Unremarkable Sinuses/Orbits: Unremarkable Other: No supplemental non-categorized findings. IMPRESSION: 1. No acute intracranial findings. 2. Periventricular white matter and corona radiata hypodensities favor chronic ischemic microvascular white matter disease. Remote lacunar infarcts including involvement of the left caudate head, genu of the left internal capsule, and bilateral thalami. Electronically Signed   By: Van Clines M.D.   On: 05/07/2021 15:11   CT CHEST WO CONTRAST  Result Date: 05/07/2021 CLINICAL DATA:  Pneumonia, effusion or abscess suspected, xray done EXAM: CT CHEST WITHOUT CONTRAST TECHNIQUE: Multidetector CT imaging of the chest was performed following the standard protocol without IV contrast. COMPARISON:  Chest x-ray with ribs 05/07/2021 FINDINGS: Cardiovascular: Normal heart size. No significant pericardial effusion. The thoracic aorta is normal in caliber. Moderate  atherosclerotic plaque of the thoracic aorta. At least Three-vessel coronary artery calcifications. Mediastinum/Nodes: No gross hilar adenopathy, noting limited sensitivity for the detection of hilar adenopathy on this noncontrast  study. Prominent but nonenlarged mediastinal lymph nodes. Multiple enlarged right axillary lymph nodes with as an example a 1.3 cm lymph node (2:51) and a 1.2 cm node (2:31). No left axillary lymph nodes. Thyroid gland, trachea, and esophagus demonstrate no significant findings. Lungs/Pleura: Right lung peribronchovascular nodular-like ground-glass and consolidative airspace opacities. No pulmonary nodule. No pulmonary mass. No pleural effusion. No pneumothorax. Upper Abdomen: No acute abnormality. Musculoskeletal: No chest wall abnormality.  Bilateral breast implants. No suspicious lytic or blastic osseous lesions. Likely old healed nondisplaced right lateral seventh rib fracture. No acute displaced fracture. Age-indeterminate, likely chronic compression fracture of the T11 vertebral body with greater than 45% height loss. IMPRESSION: 1. Right axillary lymphadenopathy. Concerning for malignancy. Recommend correlation with mammography. 2. Right lung peribronchovascular nodular-like ground-glass and consolidative airspace opacities suggestive of infection/inflammation. Markedly limited evaluation for lymphadenopathy on noncontrast study. Followup PA and lateral chest X-ray is recommended in 3-4 weeks following therapy to ensure resolution and exclude underlying malignancy. 3. Age-indeterminate, likely chronic, compression fracture of the T11 vertebral body with greater than 45% height loss. Correlate with tenderness to palpation for an acute component. 4. Aortic Atherosclerosis (ICD10-I70.0) including at least 3 vessel coronary calcifications. Electronically Signed   By: Iven Finn M.D.   On: 05/07/2021 17:06   CT Cervical Spine Wo Contrast  Result Date: 05/07/2021 CLINICAL DATA:   Neck trauma, fall EXAM: CT CERVICAL SPINE WITHOUT CONTRAST TECHNIQUE: Multidetector CT imaging of the cervical spine was performed without intravenous contrast. Multiplanar CT image reconstructions were also generated. COMPARISON:  04/30/2019 CT scan FINDINGS: Alignment: 4 mm of chronic anterolisthesis at C3-4, unchanged from 04/30/2019. Endplate sclerosis and loss of disc height at all levels between C3 and C7. Anterior spurring and loss of space at the anterior C1-2 articulation. Skull base and vertebrae: No fracture or acute bony finding identified. Soft tissues and spinal canal: Atherosclerotic calcification of the common carotid arteries and vertebral arteries. Disc levels: Uncinate and facet spurring lead to mild foraminal impingement on the right at C3-4, C4-5, and C5-6; and moderate left foraminal impingement at C4-5 and C5-6. Suspected central narrowing of the thecal sac at C3-4, C4-5, and C5-6 due to posterior osseous ridging. Upper chest: Unremarkable Other: No supplemental non-categorized findings. IMPRESSION: 1. Stable cervical spondylosis and degenerative disc disease causing multilevel impingement. 2. 4 mm of chronic degenerative anterolisthesis at C3-4, unchanged from 04/30/2019. 3. Atherosclerosis. Electronically Signed   By: Van Clines M.D.   On: 05/07/2021 15:04   CT Lumbar Spine Wo Contrast  Result Date: 05/07/2021 CLINICAL DATA:  Low back pain, trauma.  Fall. EXAM: CT LUMBAR SPINE WITHOUT CONTRAST TECHNIQUE: Multidetector CT imaging of the lumbar spine was performed without intravenous contrast administration. Multiplanar CT image reconstructions were also generated. COMPARISON:  Lumbar spine radiograph 05/07/2021 and lumbar MRI from 09/16/2018 FINDINGS: Segmentation: The lowest lumbar type non-rib-bearing vertebra is labeled as L5. Alignment: Substantial dextroconvex lumbar scoliosis with rotary component. 3 mm degenerative retrolisthesis at L1-2. Vertebrae: Chronic kyphotic  angulation at the upper S2 level, as shown on 09/16/2018, compatible with an old fracture. Sclerotic degenerative endplate findings eccentric to the left at L2-3 and eccentric to the right at L4-5 with loss of disc height and vacuum disc phenomenon. Paraspinal and other soft tissues: Atherosclerosis is present, including aortoiliac atherosclerotic disease. Prominence of stool in the visualized colon. Disc levels: T12-L1: Unremarkable. L1-2: Moderate left foraminal stenosis due to facet spurring. L2-3: Mild left foraminal stenosis and borderline central narrowing of the thecal sac due to intervertebral  spurring, disc bulge, and facet spurring. L3-4: There at least moderate central narrowing of the thecal sac due to disc bulge at this level. Bilateral facet arthropathy and left ligamentum flavum redundancy. L4-5: Prominent central narrowing of the thecal sac with moderate right foraminal stenosis due to disc bulge and potential right lateral recess disc protrusion along with congenitally short pedicles and facet arthropathy. Substantial impingement was also present at this level on 09/16/2018. L5-S1: No impingement.  Disc bulge. IMPRESSION: 1. No acute fracture is identified. Chronic kyphotic angulation at the upper S2 level compatible with an old fracture that was also shown on 09/16/2018. 2. Lumbar spondylosis, scoliosis, congenitally short pedicles, and degenerative disc disease, causing prominent impingement at L4-5, moderate impingement at L1-2 and L3-4, and mild impingement at L2-3, as detailed above. 3.  Aortic Atherosclerosis (ICD10-I70.0). 4.  Prominent stool throughout the colon favors constipation. Electronically Signed   By: Van Clines M.D.   On: 05/07/2021 14:59     Positive ROS: All other systems have been reviewed and were otherwise negative with the exception of those mentioned in the HPI and as above.  Physical Exam: BP (!) 165/88 (BP Location: Right Arm)   Pulse 84   Temp 97.8 F  (36.6 C) (Oral)   Resp 17   SpO2 91%  General:  Alert, no acute distress Psychiatric:  Patient is competent for consent with normal mood and affect   Cardiovascular:  No pedal edema, regular rate and rhythm Respiratory:  No wheezing, non-labored breathing GI:  Abdomen is soft and non-tender Skin:  No lesions in the area of chief complaint, no erythema Neurologic:  Sensation intact distally, CN grossly intact Lymphatic:  No axillary or cervical lymphadenopathy  Orthopedic Exam:  LUE: +ain/pin/u motor SILT r/u/m/ax Fingers wwp Posterior splint clean and intact Able to move shoulder without difficulty  Imaging:  As above: displaced L olecranon fracture  Assessment/Plan: 75 yo F w/L displaced olecranon fracture. We discussed the diagnosis and treatment options including both surgical and nonsurgical management. Given patient's desired activity level and dependence on walker for ambulation, we agreed that surgical management of her olecranon fracture would likely offer an improved outcome.  I have briefly discussed this patient's case with Dr. Rudene Christians who will tentatively plan to perform ORIF L olecranon this Thursday, 05/12/21. Please hold Plavix in advance of this surgery. Likely to confirm final plan on Monday. NWB on LUE. PT/OT evaluation    Leim Fabry   05/07/2021 8:55 PM

## 2021-05-08 ENCOUNTER — Encounter: Payer: Self-pay | Admitting: Internal Medicine

## 2021-05-08 ENCOUNTER — Observation Stay: Payer: Medicare Other

## 2021-05-08 DIAGNOSIS — F32A Depression, unspecified: Secondary | ICD-10-CM | POA: Diagnosis present

## 2021-05-08 DIAGNOSIS — Z96641 Presence of right artificial hip joint: Secondary | ICD-10-CM | POA: Diagnosis present

## 2021-05-08 DIAGNOSIS — Z794 Long term (current) use of insulin: Secondary | ICD-10-CM | POA: Diagnosis not present

## 2021-05-08 DIAGNOSIS — R8271 Bacteriuria: Secondary | ICD-10-CM | POA: Diagnosis present

## 2021-05-08 DIAGNOSIS — Z8673 Personal history of transient ischemic attack (TIA), and cerebral infarction without residual deficits: Secondary | ICD-10-CM | POA: Diagnosis not present

## 2021-05-08 DIAGNOSIS — G43909 Migraine, unspecified, not intractable, without status migrainosus: Secondary | ICD-10-CM | POA: Insufficient documentation

## 2021-05-08 DIAGNOSIS — W19XXXD Unspecified fall, subsequent encounter: Secondary | ICD-10-CM | POA: Diagnosis not present

## 2021-05-08 DIAGNOSIS — M4854XA Collapsed vertebra, not elsewhere classified, thoracic region, initial encounter for fracture: Secondary | ICD-10-CM | POA: Diagnosis present

## 2021-05-08 DIAGNOSIS — E109 Type 1 diabetes mellitus without complications: Secondary | ICD-10-CM | POA: Diagnosis not present

## 2021-05-08 DIAGNOSIS — Z888 Allergy status to other drugs, medicaments and biological substances status: Secondary | ICD-10-CM | POA: Diagnosis not present

## 2021-05-08 DIAGNOSIS — Z7902 Long term (current) use of antithrombotics/antiplatelets: Secondary | ICD-10-CM | POA: Diagnosis not present

## 2021-05-08 DIAGNOSIS — Y92009 Unspecified place in unspecified non-institutional (private) residence as the place of occurrence of the external cause: Secondary | ICD-10-CM | POA: Diagnosis not present

## 2021-05-08 DIAGNOSIS — S52023A Displaced fracture of olecranon process without intraarticular extension of unspecified ulna, initial encounter for closed fracture: Secondary | ICD-10-CM | POA: Diagnosis present

## 2021-05-08 DIAGNOSIS — Z885 Allergy status to narcotic agent status: Secondary | ICD-10-CM | POA: Diagnosis not present

## 2021-05-08 DIAGNOSIS — K3184 Gastroparesis: Secondary | ICD-10-CM | POA: Diagnosis present

## 2021-05-08 DIAGNOSIS — Z23 Encounter for immunization: Secondary | ICD-10-CM | POA: Diagnosis present

## 2021-05-08 DIAGNOSIS — W1830XA Fall on same level, unspecified, initial encounter: Secondary | ICD-10-CM | POA: Diagnosis present

## 2021-05-08 DIAGNOSIS — E103593 Type 1 diabetes mellitus with proliferative diabetic retinopathy without macular edema, bilateral: Secondary | ICD-10-CM | POA: Diagnosis present

## 2021-05-08 DIAGNOSIS — Z96612 Presence of left artificial shoulder joint: Secondary | ICD-10-CM | POA: Diagnosis present

## 2021-05-08 DIAGNOSIS — E1043 Type 1 diabetes mellitus with diabetic autonomic (poly)neuropathy: Secondary | ICD-10-CM | POA: Diagnosis present

## 2021-05-08 DIAGNOSIS — Z79899 Other long term (current) drug therapy: Secondary | ICD-10-CM | POA: Diagnosis not present

## 2021-05-08 DIAGNOSIS — Z20822 Contact with and (suspected) exposure to covid-19: Secondary | ICD-10-CM | POA: Diagnosis present

## 2021-05-08 DIAGNOSIS — D649 Anemia, unspecified: Secondary | ICD-10-CM | POA: Diagnosis present

## 2021-05-08 DIAGNOSIS — E039 Hypothyroidism, unspecified: Secondary | ICD-10-CM | POA: Diagnosis present

## 2021-05-08 DIAGNOSIS — E1042 Type 1 diabetes mellitus with diabetic polyneuropathy: Secondary | ICD-10-CM | POA: Diagnosis present

## 2021-05-08 DIAGNOSIS — Z9641 Presence of insulin pump (external) (internal): Secondary | ICD-10-CM | POA: Diagnosis present

## 2021-05-08 DIAGNOSIS — E871 Hypo-osmolality and hyponatremia: Secondary | ICD-10-CM | POA: Diagnosis present

## 2021-05-08 DIAGNOSIS — G8929 Other chronic pain: Secondary | ICD-10-CM | POA: Diagnosis present

## 2021-05-08 DIAGNOSIS — I10 Essential (primary) hypertension: Secondary | ICD-10-CM | POA: Diagnosis present

## 2021-05-08 DIAGNOSIS — S2231XA Fracture of one rib, right side, initial encounter for closed fracture: Secondary | ICD-10-CM | POA: Diagnosis present

## 2021-05-08 DIAGNOSIS — S52022A Displaced fracture of olecranon process without intraarticular extension of left ulna, initial encounter for closed fracture: Secondary | ICD-10-CM | POA: Diagnosis present

## 2021-05-08 DIAGNOSIS — M810 Age-related osteoporosis without current pathological fracture: Secondary | ICD-10-CM | POA: Diagnosis present

## 2021-05-08 DIAGNOSIS — E7800 Pure hypercholesterolemia, unspecified: Secondary | ICD-10-CM | POA: Diagnosis present

## 2021-05-08 DIAGNOSIS — Z7982 Long term (current) use of aspirin: Secondary | ICD-10-CM | POA: Diagnosis not present

## 2021-05-08 DIAGNOSIS — W19XXXA Unspecified fall, initial encounter: Secondary | ICD-10-CM | POA: Diagnosis not present

## 2021-05-08 LAB — GLUCOSE, CAPILLARY
Glucose-Capillary: 110 mg/dL — ABNORMAL HIGH (ref 70–99)
Glucose-Capillary: 166 mg/dL — ABNORMAL HIGH (ref 70–99)
Glucose-Capillary: 201 mg/dL — ABNORMAL HIGH (ref 70–99)
Glucose-Capillary: 215 mg/dL — ABNORMAL HIGH (ref 70–99)
Glucose-Capillary: 323 mg/dL — ABNORMAL HIGH (ref 70–99)
Glucose-Capillary: 80 mg/dL (ref 70–99)

## 2021-05-08 LAB — BASIC METABOLIC PANEL
Anion gap: 9 (ref 5–15)
BUN: 15 mg/dL (ref 8–23)
CO2: 26 mmol/L (ref 22–32)
Calcium: 8.7 mg/dL — ABNORMAL LOW (ref 8.9–10.3)
Chloride: 99 mmol/L (ref 98–111)
Creatinine, Ser: 0.61 mg/dL (ref 0.44–1.00)
GFR, Estimated: >60 mL/min (ref >60)
Glucose, Bld: 260 mg/dL — ABNORMAL HIGH (ref 70–99)
Potassium: 5 mmol/L (ref 3.5–5.1)
Sodium: 134 mmol/L — ABNORMAL LOW (ref 135–145)

## 2021-05-08 LAB — URINALYSIS, COMPLETE (UACMP) WITH MICROSCOPIC
Bilirubin Urine: NEGATIVE
Glucose, UA: 500 mg/dL — AB
Hgb urine dipstick: NEGATIVE
Ketones, ur: 15 mg/dL — AB
Leukocytes,Ua: NEGATIVE
Nitrite: NEGATIVE
Protein, ur: NEGATIVE mg/dL
Specific Gravity, Urine: 1.02 (ref 1.005–1.030)
pH: 5.5 (ref 5.0–8.0)

## 2021-05-08 LAB — CBC
HCT: 29.7 % — ABNORMAL LOW (ref 36.0–46.0)
Hemoglobin: 10 g/dL — ABNORMAL LOW (ref 12.0–15.0)
MCH: 31.3 pg (ref 26.0–34.0)
MCHC: 33.7 g/dL (ref 30.0–36.0)
MCV: 92.8 fL (ref 80.0–100.0)
Platelets: 234 10*3/uL (ref 150–400)
RBC: 3.2 MIL/uL — ABNORMAL LOW (ref 3.87–5.11)
RDW: 11.8 % (ref 11.5–15.5)
WBC: 9 10*3/uL (ref 4.0–10.5)
nRBC: 0 % (ref 0.0–0.2)

## 2021-05-08 LAB — HEMOGLOBIN A1C
Hgb A1c MFr Bld: 6.6 % — ABNORMAL HIGH (ref 4.8–5.6)
Mean Plasma Glucose: 142.72 mg/dL

## 2021-05-08 MED ORDER — HYDROCODONE-ACETAMINOPHEN 5-325 MG PO TABS
1.0000 | ORAL_TABLET | ORAL | Status: DC | PRN
  Administered 2021-05-09: 1 via ORAL
  Filled 2021-05-08: qty 1

## 2021-05-08 MED ORDER — INSULIN ASPART 100 UNIT/ML IJ SOLN
0.0000 [IU] | Freq: Every day | INTRAMUSCULAR | Status: DC
  Administered 2021-05-10: 2 [IU] via SUBCUTANEOUS
  Administered 2021-05-14: 3 [IU] via SUBCUTANEOUS
  Filled 2021-05-08 (×2): qty 1

## 2021-05-08 MED ORDER — SENNA 8.6 MG PO TABS
1.0000 | ORAL_TABLET | Freq: Every day | ORAL | Status: DC
  Administered 2021-05-09 – 2021-05-16 (×8): 8.6 mg via ORAL
  Filled 2021-05-08 (×8): qty 1

## 2021-05-08 MED ORDER — INSULIN ASPART 100 UNIT/ML IJ SOLN
3.0000 [IU] | Freq: Three times a day (TID) | INTRAMUSCULAR | Status: DC
  Administered 2021-05-09 – 2021-05-11 (×7): 3 [IU] via SUBCUTANEOUS
  Filled 2021-05-08 (×7): qty 1

## 2021-05-08 MED ORDER — INSULIN GLARGINE-YFGN 100 UNIT/ML ~~LOC~~ SOLN
15.0000 [IU] | Freq: Every day | SUBCUTANEOUS | Status: DC
  Administered 2021-05-08: 15 [IU] via SUBCUTANEOUS
  Filled 2021-05-08: qty 0.15

## 2021-05-08 MED ORDER — DEXTROSE-NACL 5-0.9 % IV SOLN: INTRAVENOUS | Status: DC

## 2021-05-08 MED ORDER — IOHEXOL 350 MG/ML SOLN
60.0000 mL | Freq: Once | INTRAVENOUS | Status: AC | PRN
  Administered 2021-05-08: 60 mL via INTRAVENOUS

## 2021-05-08 MED ORDER — INSULIN ASPART 100 UNIT/ML IJ SOLN
0.0000 [IU] | Freq: Three times a day (TID) | INTRAMUSCULAR | Status: DC
  Administered 2021-05-09: 2 [IU] via SUBCUTANEOUS
  Administered 2021-05-10: 1 [IU] via SUBCUTANEOUS
  Administered 2021-05-10: 2 [IU] via SUBCUTANEOUS
  Administered 2021-05-10: 1 [IU] via SUBCUTANEOUS
  Administered 2021-05-11: 4 [IU] via SUBCUTANEOUS
  Administered 2021-05-11 – 2021-05-13 (×4): 2 [IU] via SUBCUTANEOUS
  Administered 2021-05-13: 4 [IU] via SUBCUTANEOUS
  Administered 2021-05-13 – 2021-05-14 (×2): 2 [IU] via SUBCUTANEOUS
  Administered 2021-05-14 – 2021-05-15 (×2): 1 [IU] via SUBCUTANEOUS
  Administered 2021-05-15: 3 [IU] via SUBCUTANEOUS
  Administered 2021-05-15: 1 [IU] via SUBCUTANEOUS
  Administered 2021-05-16 (×2): 2 [IU] via SUBCUTANEOUS
  Filled 2021-05-08 (×16): qty 1

## 2021-05-08 MED ORDER — INSULIN GLARGINE-YFGN 100 UNIT/ML ~~LOC~~ SOLN
8.0000 [IU] | Freq: Every day | SUBCUTANEOUS | Status: DC
  Administered 2021-05-09 – 2021-05-11 (×3): 8 [IU] via SUBCUTANEOUS
  Filled 2021-05-08 (×3): qty 0.08

## 2021-05-08 MED ORDER — IOHEXOL 9 MG/ML PO SOLN
500.0000 mL | ORAL | Status: AC
  Administered 2021-05-08 (×2): 500 mL via ORAL

## 2021-05-08 MED ORDER — ATORVASTATIN CALCIUM 20 MG PO TABS
40.0000 mg | ORAL_TABLET | Freq: Every day | ORAL | Status: DC
  Administered 2021-05-09 – 2021-05-15 (×7): 40 mg via ORAL
  Filled 2021-05-08 (×7): qty 2

## 2021-05-08 MED ORDER — POLYETHYLENE GLYCOL 3350 17 G PO PACK
17.0000 g | PACK | Freq: Every day | ORAL | Status: DC
  Administered 2021-05-09 – 2021-05-15 (×5): 17 g via ORAL
  Filled 2021-05-08 (×7): qty 1

## 2021-05-08 MED ORDER — ENOXAPARIN SODIUM 40 MG/0.4ML IJ SOSY
40.0000 mg | PREFILLED_SYRINGE | INTRAMUSCULAR | Status: DC
  Administered 2021-05-08 – 2021-05-10 (×3): 40 mg via SUBCUTANEOUS
  Filled 2021-05-08 (×3): qty 0.4

## 2021-05-08 MED ORDER — HYDROMORPHONE HCL 1 MG/ML IJ SOLN
1.0000 mg | INTRAMUSCULAR | Status: DC | PRN
  Administered 2021-05-08 (×2): 1 mg via INTRAVENOUS
  Filled 2021-05-08 (×2): qty 1

## 2021-05-08 NOTE — Progress Notes (Signed)
MD wants patient to have a bladder scan.  The nurse tech bladder scanned patient (672m). This nurse attempted to get the patient up to use BSC. Patient is lethargic.  MD notified. See order for continuous pulse ox.  O2 Sat in the 70's. Placed patient on 4 L [place] oxygen. Recovered well.  Dropped Oxygen to 2 L and patient sats maintained 95-97 with 2 L via nasal cannula.  MD aware.    Husband notified at approx 1820.

## 2021-05-08 NOTE — Evaluation (Signed)
Physical Therapy Evaluation Patient Details Name: Erin Daniels MRN: [id number] DOB: 05-07-1946 Today's Date: 05/08/2021  History of Present Illness  Erin Daniels is a 75 y.o. female with medical history significant for insulin-dependent diabetes mellitus, history of CVA, diabetic retinopathy, hypertension and hypothyroidism who was brought into the ER by her husband for evaluation following a fall the night prior to her admission. Pt found to have L olecranon process fx (NWB LUE), R 7th rib fx, and unknown age T11 compression fx.  Clinical Impression  The pt presents with pain and confusion as limiting factors for progression of mobility. The pt was able to tolerate standing but requires increased assistance for safety and c/o significant LBP. At this time the pt was unable to progress towards gait training. Per MD notes the pt is expected to have surgical procedure on 9/22. Will continue to follow to make d/c recommendations.      Recommendations for follow up therapy are one component of a multi-disciplinary discharge planning process, led by the attending physician.  Recommendations may be updated based on patient status, additional functional criteria and insurance authorization.  Follow Up Recommendations SNF    Equipment Recommendations   (Will continue to assess.)    Recommendations for Other Services       Precautions / Restrictions Precautions Precautions: Fall Restrictions Weight Bearing Restrictions: Yes LUE Weight Bearing: Non weight bearing      Mobility  Bed Mobility Overal bed mobility: Needs Assistance Bed Mobility: Supine to Sit     Supine to sit: Min assist          Transfers Overall transfer level: Needs assistance Equipment used: 1 person hand held assist Transfers: Sit to/from Stand Sit to Stand: Mod assist         General transfer comment: MOD A for balance upon standing.  Ambulation/Gait             General Gait Details:  Unable to safely begin gait training.  Stairs            Wheelchair Mobility    Modified Rankin (Stroke Patients Only)       Balance Overall balance assessment: Needs assistance;History of Falls Sitting-balance support: Bilateral upper extremity supported;Single extremity supported Sitting balance-Leahy Scale: Fair   Postural control: Posterior lean;Right lateral lean Standing balance support: Single extremity supported Standing balance-Leahy Scale: Fair Standing balance comment: Posterior LOB                             Pertinent Vitals/Pain Pain Assessment: Faces Faces Pain Scale: Hurts whole lot Pain Location: LBP, R flank pain. Pain Descriptors / Indicators: Aching;Sore    Home Living Family/patient expects to be discharged to:: Private residence Living Arrangements: Spouse/significant other Available Help at Discharge: Other (Comment) (unknown; pt with confusion and unable to state.) Type of Home: House Home Access: Stairs to enter Entrance Stairs-Rails: Chemical engineer of Steps: Pt reporting that she was unaware of the number of steps; previous notes state 2 Home Layout: One level Home Equipment: Walker - 2 wheels Additional Comments: Pt with confusion; unable to report home environment. Carried over from previous note, would double check with family present.    Prior Function Level of Independence: Independent with assistive device(s)               Hand Dominance   Dominant Hand: Right    Extremity/Trunk Assessment   Upper Extremity Assessment Upper Extremity Assessment:  LUE deficits/detail;RUE deficits/detail RUE Sensation: WNL RUE Coordination: WNL LUE: Unable to fully assess due to immobilization LUE Coordination: decreased gross motor    Lower Extremity Assessment Lower Extremity Assessment: Generalized weakness       Communication      Cognition Arousal/Alertness: Awake/alert Behavior During Therapy:  Restless;Agitated;Anxious Overall Cognitive Status: No family/caregiver present to determine baseline cognitive functioning Area of Impairment: Problem solving                                      General Comments      Exercises     Assessment/Plan    PT Assessment Patient needs continued PT services  PT Problem List Decreased strength;Decreased balance;Decreased cognition;Decreased mobility;Decreased activity tolerance;Decreased safety awareness;Pain       PT Treatment Interventions Therapeutic activities;Gait training;Balance training;Functional mobility training    PT Goals (Current goals can be found in the Care Plan section)  Acute Rehab PT Goals Patient Stated Goal: unable to state a goal d/t confusion PT Goal Formulation: Patient unable to participate in goal setting Time For Goal Achievement: 05/22/21 Potential to Achieve Goals: Fair    Frequency Min 2X/week   Barriers to discharge   expected surgical procedure on 9/22.    Co-evaluation               AM-PAC PT "6 Clicks" Mobility  Outcome Measure Help needed turning from your back to your side while in a flat bed without using bedrails?: A Little Help needed moving from lying on your back to sitting on the side of a flat bed without using bedrails?: A Little Help needed moving to and from a bed to a chair (including a wheelchair)?: A Lot Help needed standing up from a chair using your arms (e.g., wheelchair or bedside chair)?: A Lot Help needed to walk in hospital room?: Total Help needed climbing 3-5 steps with a railing? : Total 6 Click Score: 12    End of Session Equipment Utilized During Treatment: Gait belt Activity Tolerance: Patient limited by pain Patient left: in bed;with bed alarm set;with call bell/phone within reach Nurse Communication: Mobility status PT Visit Diagnosis: Unsteadiness on feet (R26.81);History of falling (Z91.81);Muscle weakness (generalized)  (M62.81);Difficulty in walking, not elsewhere classified (R26.2);Pain Pain - Right/Left: Left Pain - part of body: Arm (L sided back pain.)    Time: 1129-1150 PT Time Calculation (min) (ACUTE ONLY): 21 min   Charges:   PT Evaluation $PT Eval Moderate Complexity: 1 Mod PT Treatments $Therapeutic Activity: 8-22 mins        1:12 PM, 05/08/21 Gabryel Files A. Saverio Danker PT, DPT Physical Therapist - [facility]   Adetokunbo Mccadden A Cherylene Ferrufino 05/08/2021, 1:12 PM

## 2021-05-08 NOTE — Progress Notes (Signed)
PROGRESS NOTE    Erin Daniels  [id number] DOB: Nov 05, 1945 DOA: 05/07/2021 PCP: Juluis Pitch, MD  Outpatient Specialists: endocrinology    Brief Narrative:   Erin Daniels is a 75 y.o. female with medical history significant for insulin-dependent diabetes mellitus, history of CVA, diabetic retinopathy, hypertension and hypothyroidism who was brought into the ER by her husband for evaluation following a fall the night prior to her admission. Patient was started on antibiotic therapy 2 days prior to this hospitalization for UTI and was also placed on tramadol for pain control.  At baseline she usually ambulates with a rolling walker and according to the husband she had gone to use the bathroom and on her way out forgot to take her walker and fell landing on her left elbow.  He was able to assist her up but noticed significant swelling involving the left elbow and then patient started complaining of pain in the right lateral chest wall.  She rated her pain a 6 to 7 x 10 in intensity at its worst and left elbow pain is worse with any form of movement activity. She complains of urinary frequency.   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 11/2020 mammo birads 1 - will need outpt f/u  # Right 7th rib fracture # Right flank pain In pain today. Had  pain right flank previously, prior to fall. Urinalysis here not suggestive of infection (husband concerned this sign of infection). Does have age-indeterminate compression fracture of t11 that could be cause of this pain. LFTs unremarkable. No hematuria to suggest stone - pain control as above - f/u urine culture - will check ct of abdomen  - incentive spirometry, monitor for pna s/s/  # T1DM On insulin pump at home. Glucose elevated this am. Pump has been removed - dm educator consult - SSI - lantus 15  # Bacteriuria Abnormal ua on 9/15 at pcp's office. Pt denies uti symptoms. Given ceftriaxone here - given asymptomatic, will hold abx, monitor  # HTN - home losartan  # Chronic pain - home gabapentin and effexor   # Hx CVA - cont home statin - hold asa/plavix in preparation for surgery   DVT prophylaxis: lovenox Code Status: full Family Communication: husband updated telephonically 9/18  Level of care: Med-Surg Status is: Observation  The patient will require care spanning > 2 midnights and should be moved to inpatient because: Inpatient level of care appropriate due to severity of illness  Dispo: The patient is from: Home              Anticipated d/c is to: tbd              Patient currently is not medically stable to d/c.   Difficult to place patient No    Consultants:  orthopedics  Procedures: none  Antimicrobials:  ceftriaxone    Subjective: Right  rib pain, worse with breathing  Objective: Vitals:   05/07/21 2150 05/07/21 2217 05/08/21 0311 05/08/21 0700  BP: (!) 153/64  (!) 150/70 (!) 163/80  Pulse: 87  93 99  Resp: '18  16 16  '$ Temp: 98.4 F (36.9 C)  98.7 F (37.1 C) 98 F (36.7 C)  TempSrc:    Oral  SpO2: 98%  97% 93%  Weight:  53.5 kg    Height:  5' 3.5" (1.613 m)      Intake/Output Summary (Last 24 hours) at 05/08/2021 0954 Last data filed at 05/08/2021 0400 Gross per 24 hour  Intake 100 ml  Output --  Net 100 ml   Filed Weights    05/07/21 2217  Weight: 53.5 kg    Examination:  General exam: Appears calm and comfortable  Respiratory system: Clear to auscultation. Respiratory effort normal. Cardiovascular system: S1 & S2 heard, RRR. No JVD, murmurs, rubs, gallops or clicks. No pedal edema. Gastrointestinal system: Abdomen is nondistended, soft and nontender. No organomegaly or masses felt. Normal bowel sounds heard. Central nervous system: Alert and oriented. No focal neurological deficits. Extremities: Symmetric 5 x 5 power. Skin: No rashes, lesions or ulcers Psychiatry: Judgement and insight appear normal. Mood & affect appropriate.     Data Reviewed: I have personally reviewed following labs and imaging studies  CBC: Recent Labs  Lab 05/07/21 1523 05/08/21 0605  WBC 8.4 9.0  NEUTROABS 6.4  --   HGB 10.6* 10.0*  HCT 31.3* 29.7*  MCV 94.6 92.8  PLT 248 [id number]   Basic Metabolic Panel: Recent Labs  Lab 05/07/21 1523 05/08/21 0605  NA 134* 134*  K 4.4 5.0  CL 99 99  CO2 27 26  GLUCOSE 223* 260*  BUN 16 15  CREATININE 0.68 0.61  CALCIUM 8.8* 8.7*   GFR: Estimated Creatinine Clearance: 49.7 mL/min (by C-G formula based on SCr of 0.61 mg/dL). Liver Function Tests: Recent Labs  Lab 05/07/21 1523  AST 25  ALT 18  ALKPHOS 78  BILITOT 0.4  PROT 6.9  ALBUMIN 3.6   No results for input(s): LIPASE, AMYLASE in the last 168 hours. No results for input(s): AMMONIA in the last 168 hours. Coagulation Profile: No results for input(s): INR, PROTIME in the last 168 hours. Cardiac Enzymes: Recent Labs  Lab 05/07/21 2201  CKTOTAL 188   BNP (last 3 results) No results for input(s): PROBNP in the last 8760 hours. HbA1C: Recent Labs    05/07/21 1600  HGBA1C 6.6*   CBG: Recent Labs  Lab 05/07/21 2155 05/08/21 0024 05/08/21 0314 05/08/21 0817  GLUCAP 127* 201* 166* 323*   Lipid Profile: No results for input(s): CHOL, HDL, LDLCALC, TRIG, CHOLHDL, LDLDIRECT in the last 72 hours. Thyroid  Function Tests: No results for input(s): TSH, T4TOTAL, FREET4, T3FREE, THYROIDAB in the last 72 hours. Anemia Panel: No results for input(s): VITAMINB12, FOLATE, FERRITIN, TIBC, IRON, RETICCTPCT in the last 72 hours. Urine analysis:    Component Value Date/Time   COLORURINE YELLOW 05/08/2021 0430   APPEARANCEUR CLEAR 05/08/2021 0430   APPEARANCEUR Clear 10/19/2012 1628   LABSPEC 1.020 05/08/2021 0430   LABSPEC 1.017 10/19/2012 1628   PHURINE 5.5 05/08/2021 0430   GLUCOSEU 500 (A) 05/08/2021 0430   GLUCOSEU >=500 10/19/2012 1628   HGBUR NEGATIVE 05/08/2021 0430   BILIRUBINUR NEGATIVE 05/08/2021 0430   BILIRUBINUR Negative 10/19/2012 1628   KETONESUR 15 (A) 05/08/2021 0430   PROTEINUR NEGATIVE 05/08/2021 0430   NITRITE NEGATIVE 05/08/2021 0430   LEUKOCYTESUR NEGATIVE  05/08/2021 0430   LEUKOCYTESUR Negative 10/19/2012 1628   Sepsis Labs: '@LABRCNTIP'$ (procalcitonin:4,lacticidven:4)  ) Recent Results (from the past 240 hour(s))  Resp Panel by RT-PCR (Flu A&B, Covid) Nasopharyngeal Swab     Status: None   Collection Time: 05/07/21  3:53 PM   Specimen: Nasopharyngeal Swab; Nasopharyngeal(NP) swabs in vial transport medium  Result Value Ref Range Status   SARS Coronavirus 2 by RT PCR NEGATIVE NEGATIVE Final    Comment: (NOTE) SARS-CoV-2 target nucleic acids are NOT DETECTED.  The SARS-CoV-2 RNA is generally detectable in upper respiratory specimens during the acute phase of infection. The lowest concentration of SARS-CoV-2 viral copies this assay can detect is 138 copies/mL. A negative result does not preclude SARS-Cov-2 infection and should not be used as the sole basis for treatment or other patient management decisions. A negative result may occur with  improper specimen collection/handling, submission of specimen other than nasopharyngeal swab, presence of viral mutation(s) within the areas targeted by this assay, and inadequate number of viral copies(<138 copies/mL). A  negative result must be combined with clinical observations, patient history, and epidemiological information. The expected result is Negative.  Fact Sheet for Patients:  EntrepreneurPulse.com.au  Fact Sheet for Healthcare Providers:  IncredibleEmployment.be  This test is no t yet approved or cleared by the [place] FDA and  has been authorized for detection and/or diagnosis of SARS-CoV-2 by FDA under an Emergency Use Authorization (EUA). This EUA will remain  in effect (meaning this test can be used) for the duration of the COVID-19 declaration under Section 564(b)(1) of the Act, 21 U.S.C.section 360bbb-3(b)(1), unless the authorization is terminated  or revoked sooner.       Influenza A by PCR NEGATIVE NEGATIVE Final   Influenza B by PCR NEGATIVE NEGATIVE Final    Comment: (NOTE) The Xpert Xpress SARS-CoV-2/FLU/RSV plus assay is intended as an aid in the diagnosis of influenza from Nasopharyngeal swab specimens and should not be used as a sole basis for treatment. Nasal washings and aspirates are unacceptable for Xpert Xpress SARS-CoV-2/FLU/RSV testing.  Fact Sheet for Patients: EntrepreneurPulse.com.au  Fact Sheet for Healthcare Providers: IncredibleEmployment.be  This test is not yet approved or cleared by the [place] FDA and has been authorized for detection and/or diagnosis of SARS-CoV-2 by FDA under an Emergency Use Authorization (EUA). This EUA will remain in effect (meaning this test can be used) for the duration of the COVID-19 declaration under Section 564(b)(1) of the Act, 21 U.S.C. section 360bbb-3(b)(1), unless the authorization is terminated or revoked.  Performed at [facility], [address], [place], [place] [address]          Radiology Studies: DG Ribs Unilateral W/Chest Right  Result Date: 05/07/2021 CLINICAL DATA:  Pain following fall EXAM: RIGHT  RIBS AND CHEST - 3+ VIEW COMPARISON:  Chest radiograph 11/20/2019 FINDINGS: The cardiomediastinal silhouette is stable. There is asymmetric elevation of the right hemidiaphragm. There are patchy opacities throughout the right lung not present on the prior study from 2021. There is is a suspected trace right pleural effusion. The left lung is clear. There is no pneumothorax. There is a possible nondisplaced fracture of the right lateral seventh rib. The previously seen left rib fractures are not identified. Left shoulder arthroplasty hardware is again noted. IMPRESSION: 1. Possible nondisplaced fracture of the right lateral seventh rib. Correlate with point tenderness. 2. Patchy opacities in the right lung and suspected trace right pleural effusion could reflect pneumonia and a parapneumonic effusion in the correct  clinical setting. Recommend follow-up radiographs in 6-8 weeks to ensure resolution. Electronically Signed   By: Valetta Mole M.D.   On: 05/07/2021 15:13   DG Lumbar Spine Complete  Result Date: 05/07/2021 CLINICAL DATA:  Low back pain.  Fall. EXAM: LUMBAR SPINE - COMPLETE 4+ VIEW COMPARISON:  None. FINDINGS: Fecal loading in visualized portions of the colon. Scoliotic curvature of the lumbar spine, apex of the) no other malalignment. Multilevel degenerative disc disease most prominent at L4-5. Vacuum disc phenomena at L4-5. No fractures are seen. Lower lumbar facet degenerative changes are identified. IMPRESSION: Degenerative changes as above. No fracture or traumatic malalignment. Fecal loading in the visualized colon. Electronically Signed   By: Dorise Bullion III M.D.   On: 05/07/2021 13:11   DG Elbow Complete Left  Result Date: 05/07/2021 CLINICAL DATA:  Pain after fall EXAM: LEFT ELBOW - COMPLETE 3+ VIEW COMPARISON:  None. FINDINGS: Significant soft tissue swelling. Joint effusion. Fracture through the base of the olecranon process with displacement. The fracture is comminuted with 1  dominant fracture fragment and numerous tiny fracture fragments. No other abnormalities. IMPRESSION: Olecranon process fracture as above with resulting soft tissue swelling and a joint effusion. Electronically Signed   By: Dorise Bullion III M.D.   On: 05/07/2021 13:13   CT HEAD WO CONTRAST (5MM)  Result Date: 05/07/2021 CLINICAL DATA:  Head trauma, fall, anticoagulation (on Plavix). EXAM: CT HEAD WITHOUT CONTRAST TECHNIQUE: Contiguous axial images were obtained from the base of the skull through the vertex without intravenous contrast. COMPARISON:  08/11/2019 FINDINGS: Brain: Small remote lacunar infarcts near the genu of the left internal capsule and in the bilateral thalamus. Periventricular white matter and corona radiata hypodensities favor chronic ischemic microvascular white matter disease. Confluent hypodensity in the left periventricular white matter on image 19 of series 2 is mildly increased from 08/11/2019 but likely a manifestation of chronic ischemic process. Small remote lacunar infarct of the head of the left caudate nucleus on image 17 series 2. Otherwise, the brainstem, cerebellum, cerebral peduncles, thalamus, basal ganglia, basilar cisterns, and ventricular system appear within normal limits. No intracranial hemorrhage, mass lesion, or acute CVA. Vascular: There is atherosclerotic calcification of the cavernous carotid arteries bilaterally. Skull: Unremarkable Sinuses/Orbits: Unremarkable Other: No supplemental non-categorized findings. IMPRESSION: 1. No acute intracranial findings. 2. Periventricular white matter and corona radiata hypodensities favor chronic ischemic microvascular white matter disease. Remote lacunar infarcts including involvement of the left caudate head, genu of the left internal capsule, and bilateral thalami. Electronically Signed   By: Van Clines M.D.   On: 05/07/2021 15:11   CT CHEST WO CONTRAST  Result Date: 05/07/2021 CLINICAL DATA:  Pneumonia, effusion  or abscess suspected, xray done EXAM: CT CHEST WITHOUT CONTRAST TECHNIQUE: Multidetector CT imaging of the chest was performed following the standard protocol without IV contrast. COMPARISON:  Chest x-ray with ribs 05/07/2021 FINDINGS: Cardiovascular: Normal heart size. No significant pericardial effusion. The thoracic aorta is normal in caliber. Moderate atherosclerotic plaque of the thoracic aorta. At least Three-vessel coronary artery calcifications. Mediastinum/Nodes: No gross hilar adenopathy, noting limited sensitivity for the detection of hilar adenopathy on this noncontrast study. Prominent but nonenlarged mediastinal lymph nodes. Multiple enlarged right axillary lymph nodes with as an example a 1.3 cm lymph node (2:51) and a 1.2 cm node (2:31). No left axillary lymph nodes. Thyroid gland, trachea, and esophagus demonstrate no significant findings. Lungs/Pleura: Right lung peribronchovascular nodular-like ground-glass and consolidative airspace opacities. No pulmonary nodule. No pulmonary mass. No pleural effusion.  No pneumothorax. Upper Abdomen: No acute abnormality. Musculoskeletal: No chest wall abnormality.  Bilateral breast implants. No suspicious lytic or blastic osseous lesions. Likely old healed nondisplaced right lateral seventh rib fracture. No acute displaced fracture. Age-indeterminate, likely chronic compression fracture of the T11 vertebral body with greater than 45% height loss. IMPRESSION: 1. Right axillary lymphadenopathy. Concerning for malignancy. Recommend correlation with mammography. 2. Right lung peribronchovascular nodular-like ground-glass and consolidative airspace opacities suggestive of infection/inflammation. Markedly limited evaluation for lymphadenopathy on noncontrast study. Followup PA and lateral chest X-ray is recommended in 3-4 weeks following therapy to ensure resolution and exclude underlying malignancy. 3. Age-indeterminate, likely chronic, compression fracture of the  T11 vertebral body with greater than 45% height loss. Correlate with tenderness to palpation for an acute component. 4. Aortic Atherosclerosis (ICD10-I70.0) including at least 3 vessel coronary calcifications. Electronically Signed   By: Iven Finn M.D.   On: 05/07/2021 17:06   CT Cervical Spine Wo Contrast  Result Date: 05/07/2021 CLINICAL DATA:  Neck trauma, fall EXAM: CT CERVICAL SPINE WITHOUT CONTRAST TECHNIQUE: Multidetector CT imaging of the cervical spine was performed without intravenous contrast. Multiplanar CT image reconstructions were also generated. COMPARISON:  04/30/2019 CT scan FINDINGS: Alignment: 4 mm of chronic anterolisthesis at C3-4, unchanged from 04/30/2019. Endplate sclerosis and loss of disc height at all levels between C3 and C7. Anterior spurring and loss of space at the anterior C1-2 articulation. Skull base and vertebrae: No fracture or acute bony finding identified. Soft tissues and spinal canal: Atherosclerotic calcification of the common carotid arteries and vertebral arteries. Disc levels: Uncinate and facet spurring lead to mild foraminal impingement on the right at C3-4, C4-5, and C5-6; and moderate left foraminal impingement at C4-5 and C5-6. Suspected central narrowing of the thecal sac at C3-4, C4-5, and C5-6 due to posterior osseous ridging. Upper chest: Unremarkable Other: No supplemental non-categorized findings. IMPRESSION: 1. Stable cervical spondylosis and degenerative disc disease causing multilevel impingement. 2. 4 mm of chronic degenerative anterolisthesis at C3-4, unchanged from 04/30/2019. 3. Atherosclerosis. Electronically Signed   By: Van Clines M.D.   On: 05/07/2021 15:04   CT Lumbar Spine Wo Contrast  Result Date: 05/07/2021 CLINICAL DATA:  Low back pain, trauma.  Fall. EXAM: CT LUMBAR SPINE WITHOUT CONTRAST TECHNIQUE: Multidetector CT imaging of the lumbar spine was performed without intravenous contrast administration. Multiplanar CT image  reconstructions were also generated. COMPARISON:  Lumbar spine radiograph 05/07/2021 and lumbar MRI from 09/16/2018 FINDINGS: Segmentation: The lowest lumbar type non-rib-bearing vertebra is labeled as L5. Alignment: Substantial dextroconvex lumbar scoliosis with rotary component. 3 mm degenerative retrolisthesis at L1-2. Vertebrae: Chronic kyphotic angulation at the upper S2 level, as shown on 09/16/2018, compatible with an old fracture. Sclerotic degenerative endplate findings eccentric to the left at L2-3 and eccentric to the right at L4-5 with loss of disc height and vacuum disc phenomenon. Paraspinal and other soft tissues: Atherosclerosis is present, including aortoiliac atherosclerotic disease. Prominence of stool in the visualized colon. Disc levels: T12-L1: Unremarkable. L1-2: Moderate left foraminal stenosis due to facet spurring. L2-3: Mild left foraminal stenosis and borderline central narrowing of the thecal sac due to intervertebral spurring, disc bulge, and facet spurring. L3-4: There at least moderate central narrowing of the thecal sac due to disc bulge at this level. Bilateral facet arthropathy and left ligamentum flavum redundancy. L4-5: Prominent central narrowing of the thecal sac with moderate right foraminal stenosis due to disc bulge and potential right lateral recess disc protrusion along with congenitally short pedicles and  facet arthropathy. Substantial impingement was also present at this level on 09/16/2018. L5-S1: No impingement.  Disc bulge. IMPRESSION: 1. No acute fracture is identified. Chronic kyphotic angulation at the upper S2 level compatible with an old fracture that was also shown on 09/16/2018. 2. Lumbar spondylosis, scoliosis, congenitally short pedicles, and degenerative disc disease, causing prominent impingement at L4-5, moderate impingement at L1-2 and L3-4, and mild impingement at L2-3, as detailed above. 3.  Aortic Atherosclerosis (ICD10-I70.0). 4.  Prominent stool  throughout the colon favors constipation. Electronically Signed   By: Van Clines M.D.   On: 05/07/2021 14:59        Scheduled Meds:  vitamin C  500 mg Oral BID   aspirin EC  81 mg Oral Daily   brimonidine  1 drop Right Eye TID   And   timolol  1 drop Right Eye TID   calcium-vitamin D  1 tablet Oral QPM   cholecalciferol  5,000 Units Oral Daily   clopidogrel  75 mg Oral QPM   gabapentin  300 mg Oral BID   And   gabapentin  900 mg Oral QHS   insulin aspart  0-5 Units Subcutaneous QHS   insulin aspart  0-9 Units Subcutaneous TID WC   insulin glargine-yfgn  15 Units Subcutaneous Daily   insulin pump   Subcutaneous TID WC, HS, 0200   losartan  50 mg Oral QPM   multivitamin with minerals  1 tablet Oral Daily   Netarsudil-Latanoprost  1 drop Right Eye QHS   polyethylene glycol  17 g Oral QODAY   senna  1 tablet Oral Daily   sodium chloride flush  3 mL Intravenous Q12H   venlafaxine  75 mg Oral BID   Continuous Infusions:  cefTRIAXone (ROCEPHIN)  IV 1 g (05/07/21 2312)     LOS: 0 days    Time spent: 36 min    Desma Maxim, MD [facility]   If 7PM-7AM, please contact night-coverage [email or web address] Password [facility] 05/08/2021, 9:54 AM

## 2021-05-08 NOTE — Progress Notes (Signed)
Inpatient Diabetes Program Recommendations  AACE/ADA: New Consensus Statement on Inpatient Glycemic Control (2015)  Target Ranges:  Prepandial:   less than 140 mg/dL      Peak postprandial:   less than 180 mg/dL (1-2 hours)      Critically ill patients:  140 - 180 mg/dL   Lab Results  Component Value Date   GLUCAP 215 (H) 05/08/2021   HGBA1C 6.6 (H) 05/07/2021    Review of Glycemic Control Results for Erin Daniels, Erin Daniels (MRN [id number]) as of 05/08/2021 14:23  Ref. Range 05/08/2021 03:14 05/08/2021 08:17 05/08/2021 11:56  Glucose-Capillary Latest Ref Range: 70 - 99 mg/dL 166 (H) 323 (H) 215 (H)   Diabetes history: DM 1 12 am 0.35 units/hr 9:30 am 0.25 units/hr 12 pm 0.4 units/hr 3:30 pm 0.3 units/hr 6:30 pm 0.25 units/hr 24-hr basal = 7.625 units  Bolus I:C ratio 12 AM 25, 7 am 14, 10 am 16 Sensitivity 80 Target 120 - 150 Active insulin time 4 hrs Current orders for Inpatient glycemic control:  Novolog sensitive tid with meals and HS Semglee 15 units daily Inpatient Diabetes Program Recommendations:    Based on patient's home insulin pump settings, consider reducing Semglee to 8 units daily.  Also reduce Novolog correction to very sensitive (0-6 units) tid with meals and HS. Please add Novolog meal coverage 3 units tid with meals (hold if patient eats less than 50% and NPO).    Thanks,  Adah Perl, RN, BC-ADM Inpatient Diabetes Coordinator Pager [phone]

## 2021-05-08 NOTE — Plan of Care (Signed)
Patient in a tremendous amount of pain during the night. Dr. Damita Dunnings notified. PRN morphine added to patient's pain regimen. Morphine seemed to control patient's pain well. PRN zofran given x 1 for nausea. Patient unable to manage insulin pump. Pump removed from patient and placed with patient's belongings.  Problem: Education: Goal: Knowledge of General Education information will improve Description: Including pain rating scale, medication(s)/side effects and non-pharmacologic comfort measures Outcome: Progressing   Problem: Health Behavior/Discharge Planning: Goal: Ability to manage health-related needs will improve Outcome: Progressing   Problem: Clinical Measurements: Goal: Ability to maintain clinical measurements within normal limits will improve Outcome: Progressing Goal: Will remain free from infection Outcome: Progressing Goal: Diagnostic test results will improve Outcome: Progressing Goal: Respiratory complications will improve Outcome: Progressing Goal: Cardiovascular complication will be avoided Outcome: Progressing   Problem: Activity: Goal: Risk for activity intolerance will decrease Outcome: Progressing   Problem: Nutrition: Goal: Adequate nutrition will be maintained Outcome: Progressing   Problem: Coping: Goal: Level of anxiety will decrease Outcome: Progressing   Problem: Elimination: Goal: Will not experience complications related to bowel motility Outcome: Progressing Goal: Will not experience complications related to urinary retention Outcome: Progressing   Problem: Pain Managment: Goal: General experience of comfort will improve Outcome: Progressing   Problem: Safety: Goal: Ability to remain free from injury will improve Outcome: Progressing   Problem: Skin Integrity: Goal: Risk for impaired skin integrity will decrease Outcome: Progressing   Problem: Education: Goal: Ability to describe self-care measures that may prevent or decrease  complications (Diabetes Survival Skills Education) will improve Outcome: Progressing Goal: Individualized Educational Video(s) Outcome: Progressing   Problem: Coping: Goal: Ability to adjust to condition or change in health will improve Outcome: Progressing   Problem: Fluid Volume: Goal: Ability to maintain a balanced intake and output will improve Outcome: Progressing   Problem: Health Behavior/Discharge Planning: Goal: Ability to identify and utilize available resources and services will improve Outcome: Progressing Goal: Ability to manage health-related needs will improve Outcome: Progressing   Problem: Metabolic: Goal: Ability to maintain appropriate glucose levels will improve Outcome: Progressing   Problem: Nutritional: Goal: Maintenance of adequate nutrition will improve Outcome: Progressing Goal: Progress toward achieving an optimal weight will improve Outcome: Progressing   Problem: Skin Integrity: Goal: Risk for impaired skin integrity will decrease Outcome: Progressing   Problem: Tissue Perfusion: Goal: Adequacy of tissue perfusion will improve Outcome: Progressing   Problem: Urinary Elimination: Goal: Signs and symptoms of infection will decrease Outcome: Progressing

## 2021-05-08 NOTE — Progress Notes (Signed)
FSBS 110.  Per Dr. Si Raider hold 3 units of insulin if the patient does not eat at least 50%.  Patient is lethargic.

## 2021-05-08 NOTE — Plan of Care (Signed)
  Problem: Education: Goal: Knowledge of General Education information will improve Description: Including pain rating scale, medication(s)/side effects and non-pharmacologic comfort measures Outcome: Progressing   Problem: Health Behavior/Discharge Planning: Goal: Ability to manage health-related needs will improve Outcome: Progressing   Problem: Clinical Measurements: Goal: Ability to maintain clinical measurements within normal limits will improve Outcome: Progressing Goal: Will remain free from infection Outcome: Progressing Goal: Diagnostic test results will improve Outcome: Progressing Goal: Respiratory complications will improve Outcome: Progressing Goal: Cardiovascular complication will be avoided Outcome: Progressing   Problem: Activity: Goal: Risk for activity intolerance will decrease Outcome: Progressing   Problem: Nutrition: Goal: Adequate nutrition will be maintained Outcome: Progressing   Problem: Coping: Goal: Level of anxiety will decrease Outcome: Progressing   Problem: Elimination: Goal: Will not experience complications related to bowel motility Outcome: Progressing Goal: Will not experience complications related to urinary retention Outcome: Progressing   Problem: Pain Managment: Goal: General experience of comfort will improve Outcome: Progressing   Problem: Safety: Goal: Ability to remain free from injury will improve Outcome: Progressing   Problem: Skin Integrity: Goal: Risk for impaired skin integrity will decrease Outcome: Progressing   Problem: Education: Goal: Ability to describe self-care measures that may prevent or decrease complications (Diabetes Survival Skills Education) will improve Outcome: Progressing Goal: Individualized Educational Video(s) Outcome: Progressing   Problem: Coping: Goal: Ability to adjust to condition or change in health will improve Outcome: Progressing   Problem: Fluid Volume: Goal: Ability to  maintain a balanced intake and output will improve Outcome: Progressing   Problem: Health Behavior/Discharge Planning: Goal: Ability to identify and utilize available resources and services will improve Outcome: Progressing Goal: Ability to manage health-related needs will improve Outcome: Progressing   Problem: Metabolic: Goal: Ability to maintain appropriate glucose levels will improve Outcome: Progressing   Problem: Nutritional: Goal: Maintenance of adequate nutrition will improve Outcome: Progressing Goal: Progress toward achieving an optimal weight will improve Outcome: Progressing   Problem: Skin Integrity: Goal: Risk for impaired skin integrity will decrease Outcome: Progressing   Problem: Tissue Perfusion: Goal: Adequacy of tissue perfusion will improve Outcome: Progressing   Problem: Urinary Elimination: Goal: Signs and symptoms of infection will decrease Outcome: Progressing   

## 2021-05-08 NOTE — Evaluation (Signed)
Occupational Therapy Evaluation Patient Details Name: Erin Daniels MRN: [id number] DOB: 1946/01/25 Today's Date: 05/08/2021   History of Present Illness Erin Daniels is a 75 y.o. female with medical history significant for insulin-dependent diabetes mellitus, history of CVA, diabetic retinopathy, hypertension and hypothyroidism who was brought into the ER by her husband for evaluation following a fall the night prior to her admission. Pt found to have L olecranon process fx (NWB LUE), R 7th rib fx, and unknown age T11 compression fx.   Clinical Impression   Pt seen for OT evaluation this date. Upon arrival to room, pt asleep in bed however easily awoken. Pt A&Ox2, pleasantly confused, inconsistently reporting home set-up and PLOF information; plan to confirm with family as able. Pt unable to recall education regarding NWB of LUE during PT session; pt re-educated however required verbal cues throughout session to adhere to precaution. Due to current functional impairments (See OT Problem List below), pt currently requires MIN A for bed mobility, MIN A for seated UB ADLs, MAX A for seated LB dressing, MOD A for stand pivot transfers to/from [facility], and MIN GUARD to perform seated toilet hygiene. Pt would benefit from additional skilled OT services to maximize return to PLOF and minimize risk of future falls, injury, caregiver burden, and readmission. Upon discharge, recommend SNF.   Recommendations for follow up therapy are one component of a multi-disciplinary discharge planning process, led by the attending physician.  Recommendations may be updated based on patient status, additional functional criteria and insurance authorization.   Follow Up Recommendations  SNF    Equipment Recommendations  Other (comment) (defer to next venue of care)       Precautions / Restrictions Precautions Precautions: Fall Restrictions Weight Bearing Restrictions: Yes LUE Weight Bearing: Non weight bearing       Mobility Bed Mobility Overal bed mobility: Needs Assistance Bed Mobility: Supine to Sit;Sit to Supine     Supine to sit: Min assist Sit to supine: Min assist        Transfers Overall transfer level: Needs assistance Equipment used: 1 person hand held assist Transfers: Sit to/from [facility] Sit to Stand: Mod assist Stand pivot transfers: Mod assist       General transfer comment: MOD A for balance upon standing.    Balance Overall balance assessment: Needs assistance;History of Falls Sitting-balance support: No upper extremity supported;Feet unsupported Sitting balance-Leahy Scale: Fair Sitting balance - Comments: Fair sitting balance at EOB during UB dressing Postural control: Posterior lean;Right lateral lean Standing balance support: Single extremity supported;During functional activity Standing balance-Leahy Scale: Poor Standing balance comment: MOD A required to perform stand pivot transfer to/from [facility]                           ADL either performed or assessed with clinical judgement   ADL Overall ADL's : Needs assistance/impaired                 Upper Body Dressing : Minimal assistance;Sitting Upper Body Dressing Details (indicate cue type and reason): to don/doff hospital gown Lower Body Dressing: Maximal assistance;Sitting/lateral leans Lower Body Dressing Details (indicate cue type and reason): to don/doff socks Toilet Transfer: Moderate assistance;Stand-pivot;BSC   Toileting- Clothing Manipulation and Hygiene: Min guard;Sitting/lateral lean Toileting - Clothing Manipulation Details (indicate cue type and reason): MIN GUARD to perform frontal peri-care             Vision Baseline Vision/History: 5 Retinopathy Ability  to See in Adequate Light: 1 Impaired Patient Visual Report: Blurring of vision              Pertinent Vitals/Pain Pain Assessment: 0-10 Pain Score: 8  Faces Pain Scale: Hurts whole  lot Pain Location: LBP, R flank pain. Pain Descriptors / Indicators: Aching;Sore Pain Intervention(s): Limited activity within patient's tolerance;Monitored during session;Repositioned     Hand Dominance Right   Extremity/Trunk Assessment Upper Extremity Assessment Upper Extremity Assessment: RUE deficits/detail;LUE deficits/detail RUE Deficits / Details: Grossly at least 3/5 strength in all movements RUE Sensation: WNL RUE Coordination: WNL LUE: Unable to fully assess due to immobilization LUE Coordination: decreased gross motor   Lower Extremity Assessment Lower Extremity Assessment: Generalized weakness       Communication Communication Communication: Other (comment) (Plan to confirm with family as able)   Cognition Arousal/Alertness: Awake/alert Behavior During Therapy: WFL for tasks assessed/performed Overall Cognitive Status: No family/caregiver present to determine baseline cognitive functioning Area of Impairment: Problem solving                               General Comments: Pt alert and oriented to self and place only. Pleasantly confused, able to follow one step directions with increased time and multi-modal cues.              Home Living Family/patient expects to be discharged to:: Private residence Living Arrangements: Spouse/significant other Available Help at Discharge: Family;Available 24 hours/day (Husband available 24/7) Type of Home: House Home Access: Stairs to enter [employer] of Steps: Pt reporting 5 steps to enter, previous notes state 2 Entrance Stairs-Rails: Right Home Layout: One level     Bathroom Shower/Tub: Occupational psychologist: Standard Bathroom Accessibility: Yes   Home Equipment: Walker - 2 wheels   Additional Comments: Pt with confusion, inconsistently reporting home set-up information and PLOF. Plan to confirm with family as able      Prior Functioning/Environment Level of Independence:  Independent with assistive device(s)        Comments: Pt reporting independence with dressing & spongebathing. Pt with confusion and unable to elaborate further on PLOF. Plan to confirm with family as able        OT Problem List: Decreased strength;Decreased activity tolerance;Impaired balance (sitting and/or standing);Impaired vision/perception;Decreased cognition;Impaired UE functional use;Pain      OT Treatment/Interventions: Self-care/ADL training;Therapeutic exercise;DME and/or AE instruction;Therapeutic activities;Patient/family education;Balance training    OT Goals(Current goals can be found in the care plan section) Acute Rehab OT Goals Patient Stated Goal: unable to state a goal d/t confusion OT Goal Formulation: Patient unable to participate in goal setting Time For Goal Achievement: 05/22/21 ADL Goals Pt Will Perform Upper Body Dressing: with set-up;with supervision;sitting Pt Will Transfer to Toilet: with min guard assist;stand pivot transfer;bedside commode Pt Will Perform Toileting - Clothing Manipulation and hygiene: with supervision;sit to/from stand  OT Frequency: Min 1X/week    AM-PAC OT "6 Clicks" Daily Activity     Outcome Measure Help from another person eating meals?: A Little Help from another person taking care of personal grooming?: A Little Help from another person toileting, which includes using toliet, bedpan, or urinal?: A Lot Help from another person bathing (including washing, rinsing, drying)?: A Lot Help from another person to put on and taking off regular upper body clothing?: A Little Help from another person to put on and taking off regular lower body clothing?: A Lot 6 Click Score:  15   End of Session Nurse Communication: Mobility status  Activity Tolerance: Patient tolerated treatment well Patient left: in bed;with call bell/phone within reach;with bed alarm set;with nursing/sitter in room  OT Visit Diagnosis: Unsteadiness on feet  (R26.81);Muscle weakness (generalized) (M62.81);History of falling (Z91.81);Pain;Other symptoms and signs involving cognitive function Pain - Right/Left: Right Pain - part of body:  (ribs)                Time: [id number] OT Time Calculation (min): 30 min Charges:  OT General Charges $OT Visit: 1 Visit OT Evaluation $OT Eval Moderate Complexity: 1 Mod OT Treatments $Self Care/Home Management : 23-37 mins  Fredirick Maudlin, OTR/L [place]

## 2021-05-09 ENCOUNTER — Inpatient Hospital Stay: Payer: Medicare Other

## 2021-05-09 DIAGNOSIS — W19XXXA Unspecified fall, initial encounter: Secondary | ICD-10-CM | POA: Diagnosis not present

## 2021-05-09 DIAGNOSIS — S52022A Displaced fracture of olecranon process without intraarticular extension of left ulna, initial encounter for closed fracture: Secondary | ICD-10-CM | POA: Diagnosis not present

## 2021-05-09 LAB — GLUCOSE, CAPILLARY
Glucose-Capillary: 149 mg/dL — ABNORMAL HIGH (ref 70–99)
Glucose-Capillary: 204 mg/dL — ABNORMAL HIGH (ref 70–99)
Glucose-Capillary: 141 mg/dL — ABNORMAL HIGH (ref 70–99)
Glucose-Capillary: 134 mg/dL — ABNORMAL HIGH (ref 70–99)
Glucose-Capillary: 57 mg/dL — ABNORMAL LOW (ref 70–99)
Glucose-Capillary: 170 mg/dL — ABNORMAL HIGH (ref 70–99)

## 2021-05-09 LAB — CBC
HCT: 27.6 % — ABNORMAL LOW (ref 36.0–46.0)
Hemoglobin: 9.6 g/dL — ABNORMAL LOW (ref 12.0–15.0)
MCH: 32.4 pg (ref 26.0–34.0)
MCHC: 34.8 g/dL (ref 30.0–36.0)
MCV: 93.2 fL (ref 80.0–100.0)
Platelets: 249 10*3/uL (ref 150–400)
RBC: 2.96 MIL/uL — ABNORMAL LOW (ref 3.87–5.11)
RDW: 11.7 % (ref 11.5–15.5)
WBC: 12.3 10*3/uL — ABNORMAL HIGH (ref 4.0–10.5)
nRBC: 0 % (ref 0.0–0.2)

## 2021-05-09 LAB — BASIC METABOLIC PANEL
Anion gap: 7 (ref 5–15)
BUN: 18 mg/dL (ref 8–23)
CO2: 25 mmol/L (ref 22–32)
Calcium: 8.6 mg/dL — ABNORMAL LOW (ref 8.9–10.3)
Chloride: 100 mmol/L (ref 98–111)
Creatinine, Ser: 0.51 mg/dL (ref 0.44–1.00)
GFR, Estimated: >60 mL/min (ref >60)
Glucose, Bld: 191 mg/dL — ABNORMAL HIGH (ref 70–99)
Potassium: 4.1 mmol/L (ref 3.5–5.1)
Sodium: 132 mmol/L — ABNORMAL LOW (ref 135–145)

## 2021-05-09 LAB — URINE CULTURE: Culture: NO GROWTH

## 2021-05-09 MED ORDER — HYDROCODONE-ACETAMINOPHEN 5-325 MG PO TABS
0.5000 | ORAL_TABLET | Freq: Four times a day (QID) | ORAL | Status: DC | PRN
  Administered 2021-05-09 – 2021-05-10 (×3): 0.5 via ORAL
  Filled 2021-05-09 (×4): qty 1

## 2021-05-09 MED ORDER — GABAPENTIN 300 MG PO CAPS
300.0000 mg | ORAL_CAPSULE | Freq: Every day | ORAL | Status: DC
  Administered 2021-05-09 – 2021-05-15 (×7): 300 mg via ORAL
  Filled 2021-05-09 (×7): qty 1

## 2021-05-09 MED ORDER — HYDROCODONE-ACETAMINOPHEN 5-325 MG PO TABS: 1.0000 | ORAL_TABLET | Freq: Four times a day (QID) | ORAL | Status: DC | PRN

## 2021-05-09 MED ORDER — GABAPENTIN 300 MG PO CAPS
300.0000 mg | ORAL_CAPSULE | Freq: Two times a day (BID) | ORAL | Status: DC
  Administered 2021-05-09 – 2021-05-16 (×13): 300 mg via ORAL
  Filled 2021-05-09 (×13): qty 1

## 2021-05-09 NOTE — Plan of Care (Signed)
At the beginning of the shift she was so lethargic that she was unable to eat, drink, or take meds. Dr. Damita Dunnings was notified and gave permission to hold all of patient's oral meds. Patient is more awake now.  Was able to take norco and tylenol this morning. Has been weaned to 1 L O2. Patient did not void this shift. In and cath completed. Patient tolerated procedure well.  Problem: Education: Goal: Knowledge of General Education information will improve Description: Including pain rating scale, medication(s)/side effects and non-pharmacologic comfort measures Outcome: Progressing   Problem: Health Behavior/Discharge Planning: Goal: Ability to manage health-related needs will improve Outcome: Progressing   Problem: Clinical Measurements: Goal: Ability to maintain clinical measurements within normal limits will improve Outcome: Progressing Goal: Will remain free from infection Outcome: Progressing Goal: Diagnostic test results will improve Outcome: Progressing Goal: Respiratory complications will improve Outcome: Progressing Goal: Cardiovascular complication will be avoided Outcome: Progressing   Problem: Activity: Goal: Risk for activity intolerance will decrease Outcome: Progressing   Problem: Nutrition: Goal: Adequate nutrition will be maintained Outcome: Progressing   Problem: Coping: Goal: Level of anxiety will decrease Outcome: Progressing   Problem: Elimination: Goal: Will not experience complications related to bowel motility Outcome: Progressing Goal: Will not experience complications related to urinary retention Outcome: Progressing   Problem: Pain Managment: Goal: General experience of comfort will improve Outcome: Progressing   Problem: Safety: Goal: Ability to remain free from injury will improve Outcome: Progressing   Problem: Skin Integrity: Goal: Risk for impaired skin integrity will decrease Outcome: Progressing   Problem: Education: Goal: Ability  to describe self-care measures that may prevent or decrease complications (Diabetes Survival Skills Education) will improve Outcome: Progressing Goal: Individualized Educational Video(s) Outcome: Progressing   Problem: Coping: Goal: Ability to adjust to condition or change in health will improve Outcome: Progressing   Problem: Fluid Volume: Goal: Ability to maintain a balanced intake and output will improve Outcome: Progressing   Problem: Health Behavior/Discharge Planning: Goal: Ability to identify and utilize available resources and services will improve Outcome: Progressing Goal: Ability to manage health-related needs will improve Outcome: Progressing   Problem: Metabolic: Goal: Ability to maintain appropriate glucose levels will improve Outcome: Progressing   Problem: Nutritional: Goal: Maintenance of adequate nutrition will improve Outcome: Progressing Goal: Progress toward achieving an optimal weight will improve Outcome: Progressing   Problem: Skin Integrity: Goal: Risk for impaired skin integrity will decrease Outcome: Progressing   Problem: Tissue Perfusion: Goal: Adequacy of tissue perfusion will improve Outcome: Progressing   Problem: Urinary Elimination: Goal: Signs and symptoms of infection will decrease Outcome: Progressing

## 2021-05-09 NOTE — Plan of Care (Signed)
  Problem: Education: Goal: Knowledge of General Education information will improve Description: Including pain rating scale, medication(s)/side effects and non-pharmacologic comfort measures Outcome: Not Progressing   Problem: Health Behavior/Discharge Planning: Goal: Ability to manage health-related needs will improve Outcome: Not Progressing   Problem: Clinical Measurements: Goal: Ability to maintain clinical measurements within normal limits will improve Outcome: Not Progressing Goal: Will remain free from infection Outcome: Not Progressing Goal: Diagnostic test results will improve Outcome: Not Progressing Goal: Respiratory complications will improve Outcome: Not Progressing Goal: Cardiovascular complication will be avoided Outcome: Not Progressing   Problem: Coping: Goal: Level of anxiety will decrease Outcome: Not Progressing

## 2021-05-09 NOTE — Progress Notes (Signed)
Patient with displaced left olecranon fracture.  With her pulmonary condition [place] day should be to wait till Thursday with her off her Plavix so that she could have a block rather than be put to sleep.  We will discuss it further with patient when she is more alert

## 2021-05-09 NOTE — Progress Notes (Signed)
I reviewed her MRI and appears there is tela open inferior endplate fracture that is subacute and it is likely because of pain.  I will be discussing this with her with brochure and model later this week that she will need to wait till Thursday because of Plavix and cannot fix wrist at same time.

## 2021-05-09 NOTE — Progress Notes (Addendum)
PROGRESS NOTE    Erin Daniels  [id number] DOB: 1945-09-24 DOA: 05/07/2021 PCP: Juluis Pitch, MD  Outpatient Specialists: endocrinology    Brief Narrative:   Erin Daniels is a 75 y.o. female with medical history significant for insulin-dependent diabetes mellitus, history of CVA, diabetic retinopathy, hypertension and hypothyroidism who was brought into the ER by her husband for evaluation following a fall the night prior to her admission. Patient was started on antibiotic therapy 2 days prior to this hospitalization for UTI and was also placed on tramadol for pain control.  At baseline she usually ambulates with a rolling walker and according to the husband she had gone to use the bathroom and on her way out forgot to take her walker and fell landing on her left elbow.  He was able to assist her up but noticed significant swelling involving the left elbow and then patient started complaining of pain in the right lateral chest wall.  She rated her pain a 6 to 7 x 10 in intensity at its worst and left elbow pain is worse with any form of movement activity. She complains of urinary frequency.   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 11/2020 mammo birads 1 - will need outpt f/u  # Urinary retention Retaining 600-700 yesterday, now performing serial PVRs and prn I/o cath,  last cath this am. May need foley. Opioids likely contributing  # Right 7th rib fracture # Right flank pain In pain today. Had pain right flank previously, prior to fall. Urinalysis here not suggestive of infection (husband concerned this sign of infection). Does have age-indeterminate compression fracture of t10 and s2 that could be cause of this pain. CT of abdomen otherwise not suggestive of cause of pain. Discussed w/ Dr. Rudene Christians, he advises mri to further eval - pain control as above - f/u urine culture - incentive spirometry, monitor for pna s/s/ - mri thoracic and lumbar spine  # T1DM On insulin pump at home. Glucose mildly elevated this am. Pump has been removed - dm educator consulted - SSI sensitive, 3 w/ meals, lantus 8 daily  # Bacteriuria Abnormal ua on 9/15 at pcp's office. Pt denies uti symptoms. Given ceftriaxone here - given asymptomatic, will hold abx, f/u culture results  # HTN - home losartan  # Chronic pain - home gabapentin (decrease evening dose due to sedation) and effexor   # Hx CVA - cont home statin - hold asa/plavix in preparation for surgery   DVT prophylaxis: lovenox Code Status: full Family Communication: husband updated telephonically 9/18. No answer when called 9/19  Level of care: Med-Surg Status is: inpt  The patient will require care spanning > 2 midnights and should be moved to inpatient because: Inpatient level of care appropriate due to severity of illness  Dispo: The patient is from: Home  Anticipated d/c is to: snf              Patient currently is not medically stable to d/c.   Difficult to place patient No    Consultants:  orthopedics  Procedures: none  Antimicrobials:  ceftriaxone    Subjective: Left elbow pain, right flank pain. No dysuria or fever  Objective: Vitals:   05/08/21 1552 05/08/21 2042 05/09/21 0446 05/09/21 0745  BP: (!) 147/63 136/71 (!) 171/69 140/62  Pulse: 73 72 86 66  Resp: '14 17 16  16  '$ Temp: 97.6 F (36.4 C) 97.8 F (36.6 C) 97.7 F (36.5 C) 98.4 F (36.9 C)  TempSrc:   Oral Oral  SpO2: 93% 100% 99%   Weight:      Height:        Intake/Output Summary (Last 24 hours) at 05/09/2021 1004 Last data filed at 05/09/2021 0640 Gross per 24 hour  Intake --  Output 1350 ml  Net -1350 ml   Filed Weights   05/07/21 2217  Weight: 53.5 kg    Examination:  General exam: Appears calm and comfortable  Respiratory system: Clear to auscultation. Respiratory effort normal. Cardiovascular system: S1 & S2 heard, RRR. No JVD, murmurs, rubs, gallops or clicks. No pedal edema. Gastrointestinal system: Abdomen is nondistended, soft and nontender. No organomegaly or masses felt. Normal bowel sounds heard. Central nervous system: Alert and oriented. No focal neurological deficits. Extremities: Symmetric 5 x 5 power. Skin: No rashes, lesions or ulcers Psychiatry: Judgement and insight appear normal. Mood & affect appropriate.     Data Reviewed: I have personally reviewed following labs and imaging studies  CBC: Recent Labs  Lab 05/07/21 1523 05/08/21 0605 05/09/21 0620  WBC 8.4 9.0 12.3*  NEUTROABS 6.4  --   --   HGB 10.6* 10.0* 9.6*  HCT 31.3* 29.7* 27.6*  MCV 94.6 92.8 93.2  PLT 248 234 0000000   Basic Metabolic Panel: Recent Labs  Lab 05/07/21 1523 05/08/21 0605 05/09/21 0620  NA 134* 134* 132*  K 4.4 5.0 4.1  CL 99 99 100  CO2 '27 26 25  '$ GLUCOSE 223* 260* 191*  BUN '16 15 18  '$ CREATININE 0.68 0.61 0.51  CALCIUM 8.8* 8.7* 8.6*   GFR: Estimated Creatinine Clearance: 49.7 mL/min (by C-G formula based on SCr of 0.51 mg/dL). Liver Function Tests: Recent Labs  Lab 05/07/21 1523  AST 25  ALT 18  ALKPHOS 78  BILITOT 0.4  PROT 6.9  ALBUMIN 3.6   No results for input(s): LIPASE, AMYLASE in the last 168 hours. No results for input(s): AMMONIA in the last 168 hours. Coagulation Profile: No results for input(s): INR, PROTIME in the last 168 hours. Cardiac  Enzymes: Recent Labs  Lab 05/07/21 2201  CKTOTAL 188   BNP (last 3 results) No results for input(s): PROBNP in the last 8760 hours. HbA1C: Recent Labs    05/07/21 1600  HGBA1C 6.6*   CBG: Recent Labs  Lab 05/08/21 1156 05/08/21 1633 05/08/21 2044 05/09/21 0143 05/09/21 0732  GLUCAP 215* 110* 80 149* 204*   Lipid Profile: No results for input(s): CHOL, HDL, LDLCALC, TRIG, CHOLHDL, LDLDIRECT in the last 72 hours. Thyroid Function Tests: No results for input(s): TSH, T4TOTAL, FREET4, T3FREE, THYROIDAB in the last 72 hours. Anemia Panel: No results for input(s): VITAMINB12, FOLATE, FERRITIN, TIBC, IRON, RETICCTPCT in the last 72 hours. Urine analysis:    Component Value Date/Time   COLORURINE YELLOW 05/08/2021 0430   APPEARANCEUR CLEAR 05/08/2021 0430  APPEARANCEUR Clear 10/19/2012 1628   LABSPEC 1.020 05/08/2021 0430   LABSPEC 1.017 10/19/2012 1628   PHURINE 5.5 05/08/2021 0430   GLUCOSEU 500 (A) 05/08/2021 0430   GLUCOSEU >=500 10/19/2012 1628   HGBUR NEGATIVE 05/08/2021 0430   BILIRUBINUR NEGATIVE 05/08/2021 0430   BILIRUBINUR Negative 10/19/2012 1628   KETONESUR 15 (A) 05/08/2021 0430   PROTEINUR NEGATIVE 05/08/2021 0430   NITRITE NEGATIVE 05/08/2021 0430   LEUKOCYTESUR NEGATIVE 05/08/2021 0430   LEUKOCYTESUR Negative 10/19/2012 1628   Sepsis Labs: '@LABRCNTIP'$ (procalcitonin:4,lacticidven:4)  ) Recent Results (from the past 240 hour(s))  Resp Panel by RT-PCR (Flu A&B, Covid) Nasopharyngeal Swab     Status: None   Collection Time: 05/07/21  3:53 PM   Specimen: Nasopharyngeal Swab; Nasopharyngeal(NP) swabs in vial transport medium  Result Value Ref Range Status   SARS Coronavirus 2 by RT PCR NEGATIVE NEGATIVE Final    Comment: (NOTE) SARS-CoV-2 target nucleic acids are NOT DETECTED.  The SARS-CoV-2 RNA is generally detectable in upper respiratory specimens during the acute phase of infection. The lowest concentration of SARS-CoV-2 viral copies this assay  can detect is 138 copies/mL. A negative result does not preclude SARS-Cov-2 infection and should not be used as the sole basis for treatment or other patient management decisions. A negative result may occur with  improper specimen collection/handling, submission of specimen other than nasopharyngeal swab, presence of viral mutation(s) within the areas targeted by this assay, and inadequate number of viral copies(<138 copies/mL). A negative result must be combined with clinical observations, patient history, and epidemiological information. The expected result is Negative.  Fact Sheet for Patients:  EntrepreneurPulse.com.au  Fact Sheet for Healthcare Providers:  IncredibleEmployment.be  This test is no t yet approved or cleared by the [place] FDA and  has been authorized for detection and/or diagnosis of SARS-CoV-2 by FDA under an Emergency Use Authorization (EUA). This EUA will remain  in effect (meaning this test can be used) for the duration of the COVID-19 declaration under Section 564(b)(1) of the Act, 21 U.S.C.section 360bbb-3(b)(1), unless the authorization is terminated  or revoked sooner.       Influenza A by PCR NEGATIVE NEGATIVE Final   Influenza B by PCR NEGATIVE NEGATIVE Final    Comment: (NOTE) The Xpert Xpress SARS-CoV-2/FLU/RSV plus assay is intended as an aid in the diagnosis of influenza from Nasopharyngeal swab specimens and should not be used as a sole basis for treatment. Nasal washings and aspirates are unacceptable for Xpert Xpress SARS-CoV-2/FLU/RSV testing.  Fact Sheet for Patients: EntrepreneurPulse.com.au  Fact Sheet for Healthcare Providers: IncredibleEmployment.be  This test is not yet approved or cleared by the [place] FDA and has been authorized for detection and/or diagnosis of SARS-CoV-2 by FDA under an Emergency Use Authorization (EUA). This EUA will  remain in effect (meaning this test can be used) for the duration of the COVID-19 declaration under Section 564(b)(1) of the Act, 21 U.S.C. section 360bbb-3(b)(1), unless the authorization is terminated or revoked.  Performed at [facility], [address], [place], [place] [address]          Radiology Studies: DG Ribs Unilateral W/Chest Right  Result Date: 05/07/2021 CLINICAL DATA:  Pain following fall EXAM: RIGHT RIBS AND CHEST - 3+ VIEW COMPARISON:  Chest radiograph 11/20/2019 FINDINGS: The cardiomediastinal silhouette is stable. There is asymmetric elevation of the right hemidiaphragm. There are patchy opacities throughout the right lung not present on the prior study from 2021. There is is a suspected trace right pleural  effusion. The left lung is clear. There is no pneumothorax. There is a possible nondisplaced fracture of the right lateral seventh rib. The previously seen left rib fractures are not identified. Left shoulder arthroplasty hardware is again noted. IMPRESSION: 1. Possible nondisplaced fracture of the right lateral seventh rib. Correlate with point tenderness. 2. Patchy opacities in the right lung and suspected trace right pleural effusion could reflect pneumonia and a parapneumonic effusion in the correct clinical setting. Recommend follow-up radiographs in 6-8 weeks to ensure resolution. Electronically Signed   By: Valetta Mole M.D.   On: 05/07/2021 15:13   DG Lumbar Spine Complete  Result Date: 05/07/2021 CLINICAL DATA:  Low back pain.  Fall. EXAM: LUMBAR SPINE - COMPLETE 4+ VIEW COMPARISON:  None. FINDINGS: Fecal loading in visualized portions of the colon. Scoliotic curvature of the lumbar spine, apex of the) no other malalignment. Multilevel degenerative disc disease most prominent at L4-5. Vacuum disc phenomena at L4-5. No fractures are seen. Lower lumbar facet degenerative changes are identified. IMPRESSION: Degenerative changes as above. No fracture or  traumatic malalignment. Fecal loading in the visualized colon. Electronically Signed   By: Dorise Bullion III M.D.   On: 05/07/2021 13:11   DG Elbow Complete Left  Result Date: 05/07/2021 CLINICAL DATA:  Pain after fall EXAM: LEFT ELBOW - COMPLETE 3+ VIEW COMPARISON:  None. FINDINGS: Significant soft tissue swelling. Joint effusion. Fracture through the base of the olecranon process with displacement. The fracture is comminuted with 1 dominant fracture fragment and numerous tiny fracture fragments. No other abnormalities. IMPRESSION: Olecranon process fracture as above with resulting soft tissue swelling and a joint effusion. Electronically Signed   By: Dorise Bullion III M.D.   On: 05/07/2021 13:13   CT HEAD WO CONTRAST (5MM)  Result Date: 05/07/2021 CLINICAL DATA:  Head trauma, fall, anticoagulation (on Plavix). EXAM: CT HEAD WITHOUT CONTRAST TECHNIQUE: Contiguous axial images were obtained from the base of the skull through the vertex without intravenous contrast. COMPARISON:  08/11/2019 FINDINGS: Brain: Small remote lacunar infarcts near the genu of the left internal capsule and in the bilateral thalamus. Periventricular white matter and corona radiata hypodensities favor chronic ischemic microvascular white matter disease. Confluent hypodensity in the left periventricular white matter on image 19 of series 2 is mildly increased from 08/11/2019 but likely a manifestation of chronic ischemic process. Small remote lacunar infarct of the head of the left caudate nucleus on image 17 series 2. Otherwise, the brainstem, cerebellum, cerebral peduncles, thalamus, basal ganglia, basilar cisterns, and ventricular system appear within normal limits. No intracranial hemorrhage, mass lesion, or acute CVA. Vascular: There is atherosclerotic calcification of the cavernous carotid arteries bilaterally. Skull: Unremarkable Sinuses/Orbits: Unremarkable Other: No supplemental non-categorized findings. IMPRESSION: 1. No  acute intracranial findings. 2. Periventricular white matter and corona radiata hypodensities favor chronic ischemic microvascular white matter disease. Remote lacunar infarcts including involvement of the left caudate head, genu of the left internal capsule, and bilateral thalami. Electronically Signed   By: Van Clines M.D.   On: 05/07/2021 15:11   CT CHEST WO CONTRAST  Result Date: 05/07/2021 CLINICAL DATA:  Pneumonia, effusion or abscess suspected, xray done EXAM: CT CHEST WITHOUT CONTRAST TECHNIQUE: Multidetector CT imaging of the chest was performed following the standard protocol without IV contrast. COMPARISON:  Chest x-ray with ribs 05/07/2021 FINDINGS: Cardiovascular: Normal heart size. No significant pericardial effusion. The thoracic aorta is normal in caliber. Moderate atherosclerotic plaque of the thoracic aorta. At least Three-vessel coronary artery calcifications. Mediastinum/Nodes: No gross  hilar adenopathy, noting limited sensitivity for the detection of hilar adenopathy on this noncontrast study. Prominent but nonenlarged mediastinal lymph nodes. Multiple enlarged right axillary lymph nodes with as an example a 1.3 cm lymph node (2:51) and a 1.2 cm node (2:31). No left axillary lymph nodes. Thyroid gland, trachea, and esophagus demonstrate no significant findings. Lungs/Pleura: Right lung peribronchovascular nodular-like ground-glass and consolidative airspace opacities. No pulmonary nodule. No pulmonary mass. No pleural effusion. No pneumothorax. Upper Abdomen: No acute abnormality. Musculoskeletal: No chest wall abnormality.  Bilateral breast implants. No suspicious lytic or blastic osseous lesions. Likely old healed nondisplaced right lateral seventh rib fracture. No acute displaced fracture. Age-indeterminate, likely chronic compression fracture of the T11 vertebral body with greater than 45% height loss. IMPRESSION: 1. Right axillary lymphadenopathy. Concerning for malignancy.  Recommend correlation with mammography. 2. Right lung peribronchovascular nodular-like ground-glass and consolidative airspace opacities suggestive of infection/inflammation. Markedly limited evaluation for lymphadenopathy on noncontrast study. Followup PA and lateral chest X-ray is recommended in 3-4 weeks following therapy to ensure resolution and exclude underlying malignancy. 3. Age-indeterminate, likely chronic, compression fracture of the T11 vertebral body with greater than 45% height loss. Correlate with tenderness to palpation for an acute component. 4. Aortic Atherosclerosis (ICD10-I70.0) including at least 3 vessel coronary calcifications. Electronically Signed   By: Iven Finn M.D.   On: 05/07/2021 17:06   CT Cervical Spine Wo Contrast  Result Date: 05/07/2021 CLINICAL DATA:  Neck trauma, fall EXAM: CT CERVICAL SPINE WITHOUT CONTRAST TECHNIQUE: Multidetector CT imaging of the cervical spine was performed without intravenous contrast. Multiplanar CT image reconstructions were also generated. COMPARISON:  04/30/2019 CT scan FINDINGS: Alignment: 4 mm of chronic anterolisthesis at C3-4, unchanged from 04/30/2019. Endplate sclerosis and loss of disc height at all levels between C3 and C7. Anterior spurring and loss of space at the anterior C1-2 articulation. Skull base and vertebrae: No fracture or acute bony finding identified. Soft tissues and spinal canal: Atherosclerotic calcification of the common carotid arteries and vertebral arteries. Disc levels: Uncinate and facet spurring lead to mild foraminal impingement on the right at C3-4, C4-5, and C5-6; and moderate left foraminal impingement at C4-5 and C5-6. Suspected central narrowing of the thecal sac at C3-4, C4-5, and C5-6 due to posterior osseous ridging. Upper chest: Unremarkable Other: No supplemental non-categorized findings. IMPRESSION: 1. Stable cervical spondylosis and degenerative disc disease causing multilevel impingement. 2. 4 mm of  chronic degenerative anterolisthesis at C3-4, unchanged from 04/30/2019. 3. Atherosclerosis. Electronically Signed   By: Van Clines M.D.   On: 05/07/2021 15:04   CT Lumbar Spine Wo Contrast  Result Date: 05/07/2021 CLINICAL DATA:  Low back pain, trauma.  Fall. EXAM: CT LUMBAR SPINE WITHOUT CONTRAST TECHNIQUE: Multidetector CT imaging of the lumbar spine was performed without intravenous contrast administration. Multiplanar CT image reconstructions were also generated. COMPARISON:  Lumbar spine radiograph 05/07/2021 and lumbar MRI from 09/16/2018 FINDINGS: Segmentation: The lowest lumbar type non-rib-bearing vertebra is labeled as L5. Alignment: Substantial dextroconvex lumbar scoliosis with rotary component. 3 mm degenerative retrolisthesis at L1-2. Vertebrae: Chronic kyphotic angulation at the upper S2 level, as shown on 09/16/2018, compatible with an old fracture. Sclerotic degenerative endplate findings eccentric to the left at L2-3 and eccentric to the right at L4-5 with loss of disc height and vacuum disc phenomenon. Paraspinal and other soft tissues: Atherosclerosis is present, including aortoiliac atherosclerotic disease. Prominence of stool in the visualized colon. Disc levels: T12-L1: Unremarkable. L1-2: Moderate left foraminal stenosis due to facet spurring. L2-3: Mild  left foraminal stenosis and borderline central narrowing of the thecal sac due to intervertebral spurring, disc bulge, and facet spurring. L3-4: There at least moderate central narrowing of the thecal sac due to disc bulge at this level. Bilateral facet arthropathy and left ligamentum flavum redundancy. L4-5: Prominent central narrowing of the thecal sac with moderate right foraminal stenosis due to disc bulge and potential right lateral recess disc protrusion along with congenitally short pedicles and facet arthropathy. Substantial impingement was also present at this level on 09/16/2018. L5-S1: No impingement.  Disc bulge.  IMPRESSION: 1. No acute fracture is identified. Chronic kyphotic angulation at the upper S2 level compatible with an old fracture that was also shown on 09/16/2018. 2. Lumbar spondylosis, scoliosis, congenitally short pedicles, and degenerative disc disease, causing prominent impingement at L4-5, moderate impingement at L1-2 and L3-4, and mild impingement at L2-3, as detailed above. 3.  Aortic Atherosclerosis (ICD10-I70.0). 4.  Prominent stool throughout the colon favors constipation. Electronically Signed   By: Van Clines M.D.   On: 05/07/2021 14:59   CT ABDOMEN PELVIS W CONTRAST  Result Date: 05/08/2021 CLINICAL DATA:  Flank pain, fall. History of diabetes and hypertension. EXAM: CT ABDOMEN AND PELVIS WITH CONTRAST TECHNIQUE: Multidetector CT imaging of the abdomen and pelvis was performed using the standard protocol following bolus administration of intravenous contrast. CONTRAST:  56m OMNIPAQUE IOHEXOL 350 MG/ML SOLN COMPARISON:  Overlapping portions CT chest and CT lumbar spine from 05/07/2021. Also MRI abdomen 12/27/2017 and CT scan from 11/13/2017. FINDINGS: Lower chest: Coronary and descending thoracic aortic atherosclerotic calcification. Mitral and aortic valve calcifications. Trace right pleural effusion. Mild atelectasis or scarring at both lung bases particularly posteriorly in the right lower lobe. Bilateral breast implants. Sclerosis anteriorly in the right seventh rib suggesting a healing rib fracture. Late phase healing of left lower rib fractures as well. Hepatobiliary: Mild intrahepatic biliary dilatation. Common bile duct 1.0 cm in diameter, roughly stable from 11/13/2017. Small cysts in segment 2 of the liver on image 17 series 2 appears stable from 2019. Pancreas: Unremarkable Spleen: Unremarkable Adrenals/Urinary Tract: Vascular calcifications along the renal hila no hydronephrosis, hydroureter, or significant abnormal renal enhancement. The adrenal glands appear normal. Somewhat  distended urinary bladder currently at 710 cc in volume. No substantial urinary bladder wall thickening. Stomach/Bowel: Substantial prominence of stool in the ascending and transverse colon terminating in the vicinity of the splenic flexure, I do not see an obvious splenic flexure lesion to account for this transition. A similar appearance was present previously on 11/13/2017 except for the transition being in the descending colon, and there may be a functional component to this unusual appearance of the colon. No dilated small bowel is identified. Vascular/Lymphatic: Atherosclerosis is present, including aortoiliac atherosclerotic disease. No pathologic adenopathy in the abdomen/pelvis is identified. Reproductive: Trace amount of gas in the cervical canal on image 50 series 6, although a similar appearance was present on 11/13/2017. No significant adnexal abnormality. Other: No ascites. Musculoskeletal: Right intertrochanteric nail observed. Old deformity of the right inferior pubic ramus, stable, likely from a healed fracture. Chronic sacral fracture at the S2 level extending transversely as described on prior exams. Probable involvement of the sacral ala on a chronic basis, favoring an insufficiency fracture morphology. These fractures demonstrated a more acute appearance on the prior CT scan from 09/15/2018. There is an inferior endplate compression fracture at T10, no appreciable change from yesterday's CT chest. A small supraumbilical hernia contains adipose tissue on image 53 series 6. Dextroconvex lumbar scoliosis.  IMPRESSION: 1. Healing bilateral lower rib fractures. 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 2. Likely late subacute or chronic inferior endplate compression fracture at T10 as shown on prior studies. 3. Distended urinary bladder at 710 cc in volume. 4. Chronic prominence of stool in the proximal colon. 5. Other imaging findings of potential clinical significance: Aortic  Atherosclerosis (ICD10-I70.0). Coronary atherosclerosis. Mitral and aortic valve calcifications. Trace right pleural effusion. Chronic mild biliary dilatation. Small supraumbilical hernia contains adipose tissue. Dextroconvex lumbar scoliosis. Electronically Signed   By: Van Clines M.D.   On: 05/08/2021 15:47        Scheduled Meds:  vitamin C  500 mg Oral BID   atorvastatin  40 mg Oral Daily   brimonidine  1 drop Right Eye TID   And   timolol  1 drop Right Eye TID   calcium-vitamin D  1 tablet Oral QPM   cholecalciferol  5,000 Units Oral Daily   enoxaparin (LOVENOX) injection  40 mg Subcutaneous Q24H   gabapentin  300 mg Oral BID   And   gabapentin  900 mg Oral QHS   insulin aspart  0-5 Units Subcutaneous QHS   insulin aspart  0-6 Units Subcutaneous TID WC   insulin aspart  3 Units Subcutaneous TID WC   insulin glargine-yfgn  8 Units Subcutaneous Daily   losartan  50 mg Oral QPM   multivitamin with minerals  1 tablet Oral Daily   polyethylene glycol  17 g Oral Daily   senna  1 tablet Oral Daily   sodium chloride flush  3 mL Intravenous Q12H   venlafaxine  75 mg Oral BID   Continuous Infusions:  dextrose 5 % and 0.9% NaCl 50 mL/hr at 05/08/21 2144     LOS: 1 day    Time spent: 30 min    Desma Maxim, MD [facility]   If 7PM-7AM, please contact night-coverage [email or web address] Password [email or web address] 05/09/2021, 10:04 AM

## 2021-05-09 NOTE — Progress Notes (Signed)
Inpatient Diabetes Program Recommendations  AACE/ADA: New Consensus Statement on Inpatient Glycemic Control   Target Ranges:  Prepandial:   less than 140 mg/dL      Peak postprandial:   less than 180 mg/dL (1-2 hours)      Critically ill patients:  140 - 180 mg/dL   Results for Erin Daniels, Erin Daniels (MRN [id number]) as of 05/09/2021 07:34  Ref. Range 05/08/2021 08:17 05/08/2021 11:56 05/08/2021 16:33 05/08/2021 20:44 05/09/2021 01:43 05/09/2021 07:32  Glucose-Capillary Latest Ref Range: 70 - 99 mg/dL 323 (H) 215 (H) 110 (H) 80 149 (H) 204 (H)   Review of Glycemic Control  Diabetes history: DM1 (makes NO insulin; requires basal, correction, and carb coverage insulin) Outpatient Diabetes medications: Omni Pod insulin pump with Novolog and Dexcom CGM Current orders for Inpatient glycemic control: Semglee 8 units daily, Novolog 0-6 units TID, Novolog 0-5 units QHS, Novolog 3 units TID with meals for meal coverage   NOTE: Patient admitted with fall at home, with left olecranon fracture as well as fracture of seventh rib on the right side, and bacteriuria. Patient has Type 1 DM and uses an insulin pump for DM management as an outpatient. Patient sees Dr. Gabriel Carina (Endocrinologist) and was last seen on 03/30/21. Per office note on 03/30/21 by Dr. Gabriel Carina, the following should be patient's insulin pump settings:  Basal 12 am 0.35 units/hr 9:30 am 0.25 units/hr 12 pm 0.4 units/hr 3:30 pm 0.3 units/hr 6:30 pm 0.25 units/hr 24-hr basal = 7.625 units  Bolus I:C ratio 12 AM 25, 7 am 14, 10 am 16 Sensitivity 80 Target 120 - 150 Active insulin time 4 hrs Patient is currently ordered SQ insulin for inpatient glycemic control.  Per chart, patient was noted to be lethargic yesterday and did not eat well and no meal coverage insulin was given on 05/08/21. Will follow along while inpatient and make further recommendations if needed based on glucose trends.  Thanks, Barnie Alderman, RN, MSN, CDE Diabetes  Coordinator Inpatient Diabetes Program [phone] (Team Pager from 8am to 5pm)

## 2021-05-10 DIAGNOSIS — S52022A Displaced fracture of olecranon process without intraarticular extension of left ulna, initial encounter for closed fracture: Secondary | ICD-10-CM | POA: Diagnosis not present

## 2021-05-10 DIAGNOSIS — W19XXXA Unspecified fall, initial encounter: Secondary | ICD-10-CM | POA: Diagnosis not present

## 2021-05-10 LAB — GLUCOSE, CAPILLARY
Glucose-Capillary: 159 mg/dL — ABNORMAL HIGH (ref 70–99)
Glucose-Capillary: 185 mg/dL — ABNORMAL HIGH (ref 70–99)
Glucose-Capillary: 193 mg/dL — ABNORMAL HIGH (ref 70–99)
Glucose-Capillary: 218 mg/dL — ABNORMAL HIGH (ref 70–99)
Glucose-Capillary: 233 mg/dL — ABNORMAL HIGH (ref 70–99)

## 2021-05-10 LAB — BASIC METABOLIC PANEL
Anion gap: 8 (ref 5–15)
BUN: 15 mg/dL (ref 8–23)
CO2: 26 mmol/L (ref 22–32)
Calcium: 8.5 mg/dL — ABNORMAL LOW (ref 8.9–10.3)
Chloride: 97 mmol/L — ABNORMAL LOW (ref 98–111)
Creatinine, Ser: 0.52 mg/dL (ref 0.44–1.00)
GFR, Estimated: >60 mL/min (ref >60)
Glucose, Bld: 166 mg/dL — ABNORMAL HIGH (ref 70–99)
Potassium: 3.9 mmol/L (ref 3.5–5.1)
Sodium: 131 mmol/L — ABNORMAL LOW (ref 135–145)

## 2021-05-10 MED ORDER — LOSARTAN POTASSIUM 50 MG PO TABS
100.0000 mg | ORAL_TABLET | Freq: Every evening | ORAL | Status: DC
  Administered 2021-05-10 – 2021-05-15 (×5): 100 mg via ORAL
  Filled 2021-05-10 (×5): qty 2

## 2021-05-10 MED ORDER — CHLORHEXIDINE GLUCONATE CLOTH 2 % EX PADS
6.0000 | MEDICATED_PAD | Freq: Every day | CUTANEOUS | Status: DC
  Administered 2021-05-11 – 2021-05-16 (×6): 6 via TOPICAL

## 2021-05-10 MED ORDER — TIZANIDINE HCL 2 MG PO TABS
2.0000 mg | ORAL_TABLET | Freq: Once | ORAL | Status: AC
  Administered 2021-05-10: 2 mg via ORAL
  Filled 2021-05-10: qty 1

## 2021-05-10 MED ORDER — HYDROCODONE-ACETAMINOPHEN 5-325 MG PO TABS
1.0000 | ORAL_TABLET | Freq: Four times a day (QID) | ORAL | Status: DC | PRN
  Administered 2021-05-10 – 2021-05-11 (×4): 1 via ORAL
  Filled 2021-05-10 (×4): qty 1

## 2021-05-10 MED ORDER — GLYCERIN (LAXATIVE) 2 G RE SUPP
1.0000 | Freq: Every day | RECTAL | Status: DC | PRN
  Filled 2021-05-10: qty 1

## 2021-05-10 MED ORDER — ACETAMINOPHEN 325 MG PO TABS
325.0000 mg | ORAL_TABLET | Freq: Once | ORAL | Status: AC
  Administered 2021-05-10: 325 mg via ORAL
  Filled 2021-05-10: qty 1

## 2021-05-10 MED ORDER — INFLUENZA VAC A&B SA ADJ QUAD 0.5 ML IM PRSY
0.5000 mL | PREFILLED_SYRINGE | INTRAMUSCULAR | Status: AC
  Administered 2021-05-11: 0.5 mL via INTRAMUSCULAR
  Filled 2021-05-10 (×2): qty 0.5

## 2021-05-10 NOTE — Progress Notes (Addendum)
PROGRESS NOTE    Erin Daniels  KTG:[id number] DOB: Aug 23, 1945 DOA: 05/07/2021 PCP: Juluis Pitch, MD  Outpatient Specialists: endocrinology    Brief Narrative:   Erin Daniels is a 75 y.o. female with medical history significant for insulin-dependent diabetes mellitus, history of CVA, diabetic retinopathy, hypertension and hypothyroidism who was brought into the ER by her husband for evaluation following a fall the night prior to her admission. Patient was started on antibiotic therapy 2 days prior to this hospitalization for UTI and was also placed on tramadol for pain control.  At baseline she usually ambulates with a rolling walker and according to the husband she had gone to use the bathroom and on her way out forgot to take her walker and fell landing on her left elbow.  He was able to assist her up but noticed significant swelling involving the left elbow and then patient started complaining of pain in the right lateral chest wall.  She rated her pain a 6 to 7 x 10 in intensity at its worst and left elbow pain is worse with any form of movement activity. She complains of urinary frequency.   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 ([place])   Essential hypertension   Depression   Stroke [facility])   Hypothyroidism   Olecranon fracture, left, closed, initial encounter   Fracture of one rib, right side, initial encounter for closed fracture   Olecranon fracture  # Fracture of left olecranon process Ortho following - non weight-bearing LUE - pt/ot consulted - tentative plan for surgery 9/22 - pain control hydrocodone - miralax senna - pt advising snf, bed search underway  # Delirium 2/2 pain, narcotics - delirium precautions - minimize opioids to the extent we can - gabapentin dose decreased  # Right axillary lymphadenopathy Read says concerning for malignancy. 11/2020  mammo birads 1 - will need outpt f/u  # Urinary retention Persistent PVRs >300, has required I/O cath multiple times - place Foley - outpt urology f/u - minimize narcotics  # Right 7th rib fracture # T10 compression fracture Ortho following, they will discuss intervention with patient - pain control as above - incentive spirometry, monitor for pna s/s/  # T1DM On insulin pump at home. Glucose mildly elevated this am. Pump has been removed - dm educator consulted - SSI sensitive, 3 w/ meals, lantus 8 daily  # Bacteriuria Abnormal ua on 9/15 at pcp's office. Pt denies uti symptoms. Given ceftriaxone here, now discontinued. Urine culture negative  # HTN Here bp elevated - increase home losartan to 100  # Chronic pain - home gabapentin (decreased evening dose due to sedation) and effexor   # Hx CVA - cont home statin - hold asa/plavix in preparation for surgery   DVT prophylaxis: lovenox Code Status: full Family Communication: husband updated @ bedside 9/19  Level of care: Med-Surg Status is: inpt  The patient will require care spanning > 2 midnights and should be moved to inpatient because: Inpatient level of care appropriate due to severity of illness  Dispo: The patient is from: Home              Anticipated d/c is to: snf              Patient currently is not medically stable to d/c.   Difficult to place patient No    Consultants:  orthopedics  Procedures: none  Antimicrobials:  S/p ceftriaxone    Subjective: Left elbow pain, right flank pain. No dysuria or fever.   Objective: Vitals:   05/09/21 1138 05/09/21 1523 05/10/21 0544 05/10/21 0920  BP: (!) 152/63 (!) 158/70 (!) 178/87 (!) 176/78  Pulse: 79 93 92 89  Resp: 16 15 16 14   Temp: 98.1 F (36.7 C) 99 F (37.2 C) 98.3 F (36.8 C) 98 F (36.7 C)  TempSrc:      SpO2: 98% 98% 98% 98%  Weight:      Height:        Intake/Output Summary (Last 24 hours) at 05/10/2021 1106 Last data filed at  05/10/2021 1020 Gross per 24 hour  Intake 120 ml  Output 750 ml  Net -630 ml   Filed Weights   05/07/21 2217  Weight: 53.5 kg    Examination:  General exam: Appears in mild pain Respiratory system: Clear to auscultation. Respiratory effort normal. Cardiovascular system: S1 & S2 heard, RRR. No JVD, murmurs, rubs, gallops or clicks. No pedal edema. Gastrointestinal system: Abdomen is nondistended, soft and nontender. No organomegaly or masses felt. Normal bowel sounds heard. Central nervous system: Alert. No focal deficits Extremities: Symmetric 5 x 5 power. Skin: No rashes, lesions or ulcers Psychiatry: appears confused    Data Reviewed: I have personally reviewed following labs and imaging studies  CBC: Recent Labs  Lab 05/07/21 1523 05/08/21 0605 05/09/21 0620  WBC 8.4 9.0 12.3*  NEUTROABS 6.4  --   --   HGB 10.6* 10.0* 9.6*  HCT 31.3* 29.7* 27.6*  MCV 94.6 92.8 93.2  PLT 248 234 998   Basic Metabolic Panel: Recent Labs  Lab 05/07/21 1523 05/08/21 0605 05/09/21 0620 05/10/21 0406  NA 134* 134* 132* 131*  K 4.4 5.0 4.1 3.9  CL 99 99 100 97*  CO2 27 26 25 26   GLUCOSE 223* 260* 191* 166*  BUN 16 15 18 15   CREATININE 0.68 0.61 0.51 0.52  CALCIUM 8.8* 8.7* 8.6* 8.5*   GFR: Estimated Creatinine Clearance: 49.7 mL/min (by C-G formula based on SCr of 0.52 mg/dL). Liver Function Tests: Recent Labs  Lab 05/07/21 1523  AST 25  ALT 18  ALKPHOS 78  BILITOT 0.4  PROT 6.9  ALBUMIN 3.6   No results for input(s): LIPASE, AMYLASE in the last 168 hours. No results for input(s): AMMONIA in the last 168 hours. Coagulation Profile: No results for input(s): INR, PROTIME in the last 168 hours. Cardiac Enzymes: Recent Labs  Lab 05/07/21 2201  CKTOTAL 188   BNP (last 3 results) No results for input(s): PROBNP in the last 8760 hours. HbA1C: Recent Labs    05/07/21 1600  HGBA1C 6.6*   CBG: Recent Labs  Lab 05/09/21 1138 05/09/21 1604 05/09/21 2115  05/09/21 2227 05/10/21 0806  GLUCAP 141* 134* 57* 170* 233*   Lipid Profile: No results for input(s): CHOL, HDL, LDLCALC, TRIG, CHOLHDL, LDLDIRECT in the last 72 hours. Thyroid Function Tests: No results for input(s): TSH, T4TOTAL, FREET4, T3FREE, THYROIDAB in the last 72 hours. Anemia Panel: No results for input(s): VITAMINB12, FOLATE, FERRITIN, TIBC, IRON, RETICCTPCT in the last 72 hours. Urine analysis:    Component Value Date/Time   COLORURINE YELLOW 05/08/2021 0430   APPEARANCEUR CLEAR 05/08/2021 0430   APPEARANCEUR Clear 10/19/2012 1628   LABSPEC 1.020 05/08/2021 0430   LABSPEC 1.017 10/19/2012 1628   PHURINE 5.5 05/08/2021 0430   GLUCOSEU 500 (A) 05/08/2021 0430   GLUCOSEU >=500 10/19/2012 1628   HGBUR NEGATIVE 05/08/2021  0430   BILIRUBINUR NEGATIVE 05/08/2021 0430   BILIRUBINUR Negative 10/19/2012 1628   KETONESUR 15 (A) 05/08/2021 0430   PROTEINUR NEGATIVE 05/08/2021 0430   NITRITE NEGATIVE 05/08/2021 0430   LEUKOCYTESUR NEGATIVE 05/08/2021 0430   LEUKOCYTESUR Negative 10/19/2012 1628   Sepsis Labs: @LABRCNTIP (procalcitonin:4,lacticidven:4)  ) Recent Results (from the past 240 hour(s))  Resp Panel by RT-PCR (Flu A&B, Covid) Nasopharyngeal Swab     Status: None   Collection Time: 05/07/21  3:53 PM   Specimen: Nasopharyngeal Swab; Nasopharyngeal(NP) swabs in vial transport medium  Result Value Ref Range Status   SARS Coronavirus 2 by RT PCR NEGATIVE NEGATIVE Final    Comment: (NOTE) SARS-CoV-2 target nucleic acids are NOT DETECTED.  The SARS-CoV-2 RNA is generally detectable in upper respiratory specimens during the acute phase of infection. The lowest concentration of SARS-CoV-2 viral copies this assay can detect is 138 copies/mL. A negative result does not preclude SARS-Cov-2 infection and should not be used as the sole basis for treatment or other patient management decisions. A negative result may occur with  improper specimen collection/handling,  submission of specimen other than nasopharyngeal swab, presence of viral mutation(s) within the areas targeted by this assay, and inadequate number of viral copies(<138 copies/mL). A negative result must be combined with clinical observations, patient history, and epidemiological information. The expected result is Negative.  Fact Sheet for Patients:  EntrepreneurPulse.com.au  Fact Sheet for Healthcare Providers:  IncredibleEmployment.be  This test is no t yet approved or cleared by the [place] FDA and  has been authorized for detection and/or diagnosis of SARS-CoV-2 by FDA under an Emergency Use Authorization (EUA). This EUA will remain  in effect (meaning this test can be used) for the duration of the COVID-19 declaration under Section 564(b)(1) of the Act, 21 U.S.C.section 360bbb-3(b)(1), unless the authorization is terminated  or revoked sooner.       Influenza A by PCR NEGATIVE NEGATIVE Final   Influenza B by PCR NEGATIVE NEGATIVE Final    Comment: (NOTE) The Xpert Xpress SARS-CoV-2/FLU/RSV plus assay is intended as an aid in the diagnosis of influenza from Nasopharyngeal swab specimens and should not be used as a sole basis for treatment. Nasal washings and aspirates are unacceptable for Xpert Xpress SARS-CoV-2/FLU/RSV testing.  Fact Sheet for Patients: EntrepreneurPulse.com.au  Fact Sheet for Healthcare Providers: IncredibleEmployment.be  This test is not yet approved or cleared by the [place] FDA and has been authorized for detection and/or diagnosis of SARS-CoV-2 by FDA under an Emergency Use Authorization (EUA). This EUA will remain in effect (meaning this test can be used) for the duration of the COVID-19 declaration under Section 564(b)(1) of the Act, 21 U.S.C. section 360bbb-3(b)(1), unless the authorization is terminated or revoked.  Performed at [facility], [address] [place], [place] [address]   Urine Culture     Status: None   Collection Time: 05/08/21  4:30 AM   Specimen: Urine, Clean Catch  Result Value Ref Range Status   Specimen Description   Final    URINE, CLEAN CATCH Performed at [facility], [address] [place], [place] [address]    Special Requests   Final    NONE Performed at [facility], [address], [place], [place] [address]    Culture   Final    NO GROWTH Performed at [facility], [place] [address] [place], [place] [address]    Report Status 05/09/2021 FINAL  Final  Radiology Studies: MR THORACIC SPINE WO CONTRAST  Result Date: 05/09/2021 CLINICAL DATA:  Back pain.  Recent fall. EXAM: MRI THORACIC AND LUMBAR SPINE WITHOUT CONTRAST TECHNIQUE: Multiplanar and multiecho pulse sequences of the thoracic and lumbar spine were obtained without intravenous contrast. COMPARISON:  CT chest and lumbar spine dated May 07, 2021. FINDINGS: MRI THORACIC SPINE FINDINGS Alignment:  Physiologic. Vertebrae: Acute to subacute T10 inferior endplate compression fracture with 40% height loss. No significant retropulsion. No additional fracture. No evidence of discitis or suspicious bone lesion. Cord:  Normal signal and morphology. Paraspinal and other soft tissues: Patchy infiltrate in the right upper and lower lobes. Small bilateral pleural effusions. Disc levels: Tiny central disc protrusion at T7-T8. No spinal canal or neuroforaminal stenosis at any level. MRI LUMBAR SPINE FINDINGS Segmentation:  Standard. Alignment:  Mild dextroscoliosis.  No significant listhesis. Vertebrae: No acute fracture, evidence of discitis, or bone lesion. Old healed sacral fractures. Conus medullaris and cauda equina: Conus extends to the T12-L1 level. Conus and cauda equina appear normal. Paraspinal and other soft tissues: Negative. Disc levels: T12-L1:  Negative. L1-L2: Mild disc bulging with superimposed central disc  protrusion. Mild left facet arthropathy. Moderate spinal canal and left neuroforaminal stenosis. No right neuroforaminal stenosis. L2-L3: Mild disc bulging with left far lateral disc osteophyte complex. Mild left facet arthropathy. Mild spinal canal and left neuroforaminal stenosis. No right neuroforaminal stenosis. L3-L4: Moderate right paracentral and subarticular disc protrusion. Mild bilateral facet arthropathy. Moderate to severe spinal canal and right lateral recess stenosis. Moderate left lateral recess stenosis. No neuroforaminal stenosis. L4-L5: Moderate disc bulging with right far lateral disc osteophyte complex. Moderate bilateral facet arthropathy. Severe spinal canal stenosis. Moderate to severe right neuroforaminal stenosis. Borderline mild left neuroforaminal stenosis. L5-S1: Small shallow broad-based posterior disc protrusion. Mild-to-moderate bilateral facet arthropathy. No stenosis. IMPRESSION: Thoracic spine: 1. Acute to subacute T10 inferior endplate compression fracture with 40% height loss. No significant retropulsion. 2. Small bilateral pleural effusions. Patchy pneumonia in the right upper and lower lobes. Lumbar spine: 1. Multilevel degenerative changes of the lumbar spine as described above. Severe spinal canal stenosis and moderate to severe right neuroforaminal stenosis at L4-L5. 2. Moderate to severe spinal canal and right lateral recess stenosis at L3-L4. 3. Moderate spinal canal stenosis and left neuroforaminal stenosis at L1-L2. Electronically Signed   By: Titus Dubin M.D.   On: 05/09/2021 18:47   MR LUMBAR SPINE WO CONTRAST  Result Date: 05/09/2021 CLINICAL DATA:  Back pain.  Recent fall. EXAM: MRI THORACIC AND LUMBAR SPINE WITHOUT CONTRAST TECHNIQUE: Multiplanar and multiecho pulse sequences of the thoracic and lumbar spine were obtained without intravenous contrast. COMPARISON:  CT chest and lumbar spine dated May 07, 2021. FINDINGS: MRI THORACIC SPINE FINDINGS  Alignment:  Physiologic. Vertebrae: Acute to subacute T10 inferior endplate compression fracture with 40% height loss. No significant retropulsion. No additional fracture. No evidence of discitis or suspicious bone lesion. Cord:  Normal signal and morphology. Paraspinal and other soft tissues: Patchy infiltrate in the right upper and lower lobes. Small bilateral pleural effusions. Disc levels: Tiny central disc protrusion at T7-T8. No spinal canal or neuroforaminal stenosis at any level. MRI LUMBAR SPINE FINDINGS Segmentation:  Standard. Alignment:  Mild dextroscoliosis.  No significant listhesis. Vertebrae: No acute fracture, evidence of discitis, or bone lesion. Old healed sacral fractures. Conus medullaris and cauda equina: Conus extends to the T12-L1 level. Conus and cauda equina appear normal. Paraspinal and other soft tissues: Negative. Disc levels: T12-L1:  Negative. L1-L2: Mild  disc bulging with superimposed central disc protrusion. Mild left facet arthropathy. Moderate spinal canal and left neuroforaminal stenosis. No right neuroforaminal stenosis. L2-L3: Mild disc bulging with left far lateral disc osteophyte complex. Mild left facet arthropathy. Mild spinal canal and left neuroforaminal stenosis. No right neuroforaminal stenosis. L3-L4: Moderate right paracentral and subarticular disc protrusion. Mild bilateral facet arthropathy. Moderate to severe spinal canal and right lateral recess stenosis. Moderate left lateral recess stenosis. No neuroforaminal stenosis. L4-L5: Moderate disc bulging with right far lateral disc osteophyte complex. Moderate bilateral facet arthropathy. Severe spinal canal stenosis. Moderate to severe right neuroforaminal stenosis. Borderline mild left neuroforaminal stenosis. L5-S1: Small shallow broad-based posterior disc protrusion. Mild-to-moderate bilateral facet arthropathy. No stenosis. IMPRESSION: Thoracic spine: 1. Acute to subacute T10 inferior endplate compression fracture  with 40% height loss. No significant retropulsion. 2. Small bilateral pleural effusions. Patchy pneumonia in the right upper and lower lobes. Lumbar spine: 1. Multilevel degenerative changes of the lumbar spine as described above. Severe spinal canal stenosis and moderate to severe right neuroforaminal stenosis at L4-L5. 2. Moderate to severe spinal canal and right lateral recess stenosis at L3-L4. 3. Moderate spinal canal stenosis and left neuroforaminal stenosis at L1-L2. Electronically Signed   By: Titus Dubin M.D.   On: 05/09/2021 18:47   CT ABDOMEN PELVIS W CONTRAST  Result Date: 05/08/2021 CLINICAL DATA:  Flank pain, fall. History of diabetes and hypertension. EXAM: CT ABDOMEN AND PELVIS WITH CONTRAST TECHNIQUE: Multidetector CT imaging of the abdomen and pelvis was performed using the standard protocol following bolus administration of intravenous contrast. CONTRAST:  66mL OMNIPAQUE IOHEXOL 350 MG/ML SOLN COMPARISON:  Overlapping portions CT chest and CT lumbar spine from 05/07/2021. Also MRI abdomen 12/27/2017 and CT scan from 11/13/2017. FINDINGS: Lower chest: Coronary and descending thoracic aortic atherosclerotic calcification. Mitral and aortic valve calcifications. Trace right pleural effusion. Mild atelectasis or scarring at both lung bases particularly posteriorly in the right lower lobe. Bilateral breast implants. Sclerosis anteriorly in the right seventh rib suggesting a healing rib fracture. Late phase healing of left lower rib fractures as well. Hepatobiliary: Mild intrahepatic biliary dilatation. Common bile duct 1.0 cm in diameter, roughly stable from 11/13/2017. Small cysts in segment 2 of the liver on image 17 series 2 appears stable from 2019. Pancreas: Unremarkable Spleen: Unremarkable Adrenals/Urinary Tract: Vascular calcifications along the renal hila no hydronephrosis, hydroureter, or significant abnormal renal enhancement. The adrenal glands appear normal. Somewhat distended  urinary bladder currently at 710 cc in volume. No substantial urinary bladder wall thickening. Stomach/Bowel: Substantial prominence of stool in the ascending and transverse colon terminating in the vicinity of the splenic flexure, I do not see an obvious splenic flexure lesion to account for this transition. A similar appearance was present previously on 11/13/2017 except for the transition being in the descending colon, and there may be a functional component to this unusual appearance of the colon. No dilated small bowel is identified. Vascular/Lymphatic: Atherosclerosis is present, including aortoiliac atherosclerotic disease. No pathologic adenopathy in the abdomen/pelvis is identified. Reproductive: Trace amount of gas in the cervical canal on image 50 series 6, although a similar appearance was present on 11/13/2017. No significant adnexal abnormality. Other: No ascites. Musculoskeletal: Right intertrochanteric nail observed. Old deformity of the right inferior pubic ramus, stable, likely from a healed fracture. Chronic sacral fracture at the S2 level extending transversely as described on prior exams. Probable involvement of the sacral ala on a chronic basis, favoring an insufficiency fracture morphology. These fractures demonstrated a more  acute appearance on the prior CT scan from 09/15/2018. There is an inferior endplate compression fracture at T10, no appreciable change from yesterday's CT chest. A small supraumbilical hernia contains adipose tissue on image 53 series 6. Dextroconvex lumbar scoliosis. IMPRESSION: 1. Healing bilateral lower rib fractures. 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 2. Likely late subacute or chronic inferior endplate compression fracture at T10 as shown on prior studies. 3. Distended urinary bladder at 710 cc in volume. 4. Chronic prominence of stool in the proximal colon. 5. Other imaging findings of potential clinical significance: Aortic Atherosclerosis  (ICD10-I70.0). Coronary atherosclerosis. Mitral and aortic valve calcifications. Trace right pleural effusion. Chronic mild biliary dilatation. Small supraumbilical hernia contains adipose tissue. Dextroconvex lumbar scoliosis. Electronically Signed   By: Van Clines M.D.   On: 05/08/2021 15:47        Scheduled Meds:  vitamin C  500 mg Oral BID   atorvastatin  40 mg Oral Daily   brimonidine  1 drop Right Eye TID   And   timolol  1 drop Right Eye TID   calcium-vitamin D  1 tablet Oral QPM   cholecalciferol  5,000 Units Oral Daily   enoxaparin (LOVENOX) injection  40 mg Subcutaneous Q24H   gabapentin  300 mg Oral QHS   And   gabapentin  300 mg Oral BID   [START ON 05/11/2021] influenza vaccine adjuvanted  0.5 mL Intramuscular Tomorrow-1000   insulin aspart  0-5 Units Subcutaneous QHS   insulin aspart  0-6 Units Subcutaneous TID WC   insulin aspart  3 Units Subcutaneous TID WC   insulin glargine-yfgn  8 Units Subcutaneous Daily   losartan  50 mg Oral QPM   multivitamin with minerals  1 tablet Oral Daily   polyethylene glycol  17 g Oral Daily   senna  1 tablet Oral Daily   sodium chloride flush  3 mL Intravenous Q12H   venlafaxine  75 mg Oral BID   Continuous Infusions:     LOS: 2 days    Time spent: 30 min    Desma Maxim, MD [facility]   If 7PM-7AM, please contact night-coverage [email or web address] Password [email or web address] 05/10/2021, 11:06 AM

## 2021-05-10 NOTE — Plan of Care (Signed)
  Problem: Education: Goal: Knowledge of General Education information will improve Description: Including pain rating scale, medication(s)/side effects and non-pharmacologic comfort measures Outcome: Not Progressing   Problem: Health Behavior/Discharge Planning: Goal: Ability to manage health-related needs will improve Outcome: Not Progressing   Problem: Clinical Measurements: Goal: Ability to maintain clinical measurements within normal limits will improve Outcome: Not Progressing Goal: Will remain free from infection Outcome: Not Progressing Goal: Diagnostic test results will improve Outcome: Not Progressing Goal: Respiratory complications will improve Outcome: Not Progressing Goal: Cardiovascular complication will be avoided Outcome: Not Progressing   Problem: Coping: Goal: Ability to adjust to condition or change in health will improve Outcome: Not Progressing   Problem: Education: Goal: Ability to describe self-care measures that may prevent or decrease complications (Diabetes Survival Skills Education) will improve Outcome: Not Progressing Goal: Individualized Educational Video(s) Outcome: Not Progressing   Problem: Skin Integrity: Goal: Risk for impaired skin integrity will decrease Outcome: Not Progressing

## 2021-05-10 NOTE — Progress Notes (Signed)
OT Cancellation Note  Patient Details Name: Erin Daniels MRN: [id number] DOB: 1946-04-05   Cancelled Treatment:    Reason Eval/Treat Not Completed: Other (comment). Upon attempt, RN preparing to do bladder ultrasound. Will re-attempt OT tx at later date/time as pt is available and appropriate.   Ardeth Perfect., MPH, MS, OTR/L ascom [phone] 05/10/21, 2:05 PM

## 2021-05-11 DIAGNOSIS — W19XXXA Unspecified fall, initial encounter: Secondary | ICD-10-CM | POA: Diagnosis not present

## 2021-05-11 DIAGNOSIS — S52022A Displaced fracture of olecranon process without intraarticular extension of left ulna, initial encounter for closed fracture: Secondary | ICD-10-CM | POA: Diagnosis not present

## 2021-05-11 LAB — GLUCOSE, CAPILLARY
Glucose-Capillary: 327 mg/dL — ABNORMAL HIGH (ref 70–99)
Glucose-Capillary: 250 mg/dL — ABNORMAL HIGH (ref 70–99)
Glucose-Capillary: 214 mg/dL — ABNORMAL HIGH (ref 70–99)
Glucose-Capillary: 109 mg/dL — ABNORMAL HIGH (ref 70–99)
Glucose-Capillary: 150 mg/dL — ABNORMAL HIGH (ref 70–99)

## 2021-05-11 LAB — BASIC METABOLIC PANEL
Anion gap: 10 (ref 5–15)
BUN: 13 mg/dL (ref 8–23)
CO2: 24 mmol/L (ref 22–32)
Calcium: 8.1 mg/dL — ABNORMAL LOW (ref 8.9–10.3)
Chloride: 94 mmol/L — ABNORMAL LOW (ref 98–111)
Creatinine, Ser: 0.59 mg/dL (ref 0.44–1.00)
GFR, Estimated: >60 mL/min (ref >60)
Glucose, Bld: 291 mg/dL — ABNORMAL HIGH (ref 70–99)
Potassium: 4.2 mmol/L (ref 3.5–5.1)
Sodium: 128 mmol/L — ABNORMAL LOW (ref 135–145)

## 2021-05-11 MED ORDER — ACETAMINOPHEN 500 MG PO TABS
1000.0000 mg | ORAL_TABLET | Freq: Three times a day (TID) | ORAL | Status: DC
  Administered 2021-05-11 – 2021-05-16 (×15): 1000 mg via ORAL
  Filled 2021-05-11 (×15): qty 2

## 2021-05-11 MED ORDER — OXYCODONE HCL 5 MG PO TABS
5.0000 mg | ORAL_TABLET | ORAL | Status: DC | PRN
  Administered 2021-05-11 – 2021-05-16 (×10): 5 mg via ORAL
  Filled 2021-05-11 (×10): qty 1

## 2021-05-11 MED ORDER — INSULIN GLARGINE-YFGN 100 UNIT/ML ~~LOC~~ SOLN
9.0000 [IU] | Freq: Every day | SUBCUTANEOUS | Status: DC
  Administered 2021-05-12 – 2021-05-13 (×2): 9 [IU] via SUBCUTANEOUS
  Filled 2021-05-11 (×2): qty 0.09

## 2021-05-11 MED ORDER — MORPHINE SULFATE (PF) 2 MG/ML IV SOLN: 1.0000 mg | INTRAVENOUS | Status: DC | PRN

## 2021-05-11 MED ORDER — KETOROLAC TROMETHAMINE 15 MG/ML IJ SOLN
15.0000 mg | Freq: Four times a day (QID) | INTRAMUSCULAR | Status: DC
  Administered 2021-05-11 – 2021-05-15 (×18): 15 mg via INTRAVENOUS
  Filled 2021-05-11 (×18): qty 1

## 2021-05-11 MED ORDER — INSULIN ASPART 100 UNIT/ML IJ SOLN
4.0000 [IU] | Freq: Three times a day (TID) | INTRAMUSCULAR | Status: DC
  Administered 2021-05-11 – 2021-05-16 (×12): 4 [IU] via SUBCUTANEOUS
  Filled 2021-05-11 (×12): qty 1

## 2021-05-11 MED ORDER — CEFAZOLIN SODIUM-DEXTROSE 1-4 GM/50ML-% IV SOLN
1.0000 g | Freq: Once | INTRAVENOUS | Status: AC
  Administered 2021-05-12: 1 g via INTRAVENOUS
  Filled 2021-05-11: qty 50

## 2021-05-11 NOTE — Progress Notes (Signed)
Inpatient Diabetes Program Recommendations  AACE/ADA: New Consensus Statement on Inpatient Glycemic Control   Target Ranges:  Prepandial:   less than 140 mg/dL      Peak postprandial:   less than 180 mg/dL (1-2 hours)      Critically ill patients:  140 - 180 mg/dL   Results for Erin Daniels, Erin Daniels (MRN [id number]) as of 05/11/2021 09:15  Ref. Range 05/10/2021 08:06 05/10/2021 12:49 05/10/2021 16:42 05/10/2021 20:50 05/10/2021 23:44 05/11/2021 08:10  Glucose-Capillary Latest Ref Range: 70 - 99 mg/dL 233 (H) 193 (H) 185 (H) 218 (H) 159 (H) 327 (H)    Review of Glycemic Control  Diabetes history: DM1 (makes NO insulin; requires basal, correction, and carb coverage insulin) Outpatient Diabetes medications: Omni Pod insulin pump with Novolog and Dexcom CGM Current orders for Inpatient glycemic control: Semglee 8 units daily, Novolog 0-6 units TID, Novolog 0-5 units QHS, Novolog 3 units TID with meals for meal coverage  Inpatient Diabetes Program Recommendations:    Insulin: Please consider increasing Semglee to 9 units daily and meal coverage to Novolog 4 units TID with meals if patient eats at least 50% of meals.  Thanks, Barnie Alderman, RN, MSN, CDE Diabetes Coordinator Inpatient Diabetes Program [phone] (Team Pager from 8am to 5pm)

## 2021-05-11 NOTE — Progress Notes (Signed)
Discussed kyphoplasty at T11 as well as ORIF of the left olecranon.  Bone model and brochure used in discussing kyphoplasty.  She has been off her Plavix and should be okay for surgery tomorrow.

## 2021-05-11 NOTE — Progress Notes (Signed)
PROGRESS NOTE    Erin Daniels  DGL:[id number] DOB: Feb 23, 1946 DOA: 05/07/2021 PCP: Juluis Pitch, MD    Brief Narrative:  75 y.o. female with medical history significant for insulin-dependent diabetes mellitus, history of CVA, diabetic retinopathy, hypertension and hypothyroidism who was brought into the ER by her husband for evaluation following a fall the night prior to her admission. Patient was started on antibiotic therapy 2 days prior to this hospitalization for UTI and was also placed on tramadol for pain control.  At baseline she usually ambulates with a rolling walker and according to the husband she had gone to use the bathroom and on her way out forgot to take her walker and fell landing on her left elbow.  He was able to assist her up but noticed significant swelling involving the left elbow and then patient started complaining of pain in the right lateral chest wall.  She rated her pain a 6 to 7 x 10 in intensity at its worst and left elbow pain is worse with any form of movement activity. She complains of urinary frequency.  Orthopedics on consult with plan for ORIF of left olecranon and kyphoplasty at T11.  Surgical intervention planned for 9/22 as patient needed time for Plavix washout.   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 after 12 Therapy to start postop day 1   # Delirium 2/2 pain, narcotics - delirium precautions - minimize opioids to the extent we can - gabapentin dose decreased -Added Toradol for pain  control   # Right axillary lymphadenopathy Read says concerning for malignancy. 11/2020 mammo birads 1 - will need outpt f/u   # Urinary retention Persistent PVRs >300, has required I/O cath multiple times -Foley in place, continue for now   # Right 7th rib fracture # T10 compression fracture Ortho following Plan for kyphoplasty 9/22 Plavix on hold .  Pain control   # T1DM On insulin pump at home. Glucose mildly elevated this am. Pump has been removed - dm educator consulted -Tested sliding scale, NovoLog with meals, Lantus 10 units daily   # Bacteriuria Abnormal ua on 9/15 at pcp's office. Pt denies uti symptoms. Given ceftriaxone here, now discontinued. Urine culture negative   # HTN Here bp elevated - increase home losartan to 100 -Ensure pain control   # Chronic pain - home gabapentin (decreased evening dose due to sedation) and effexor    # Hx CVA - cont home statin - hold asa/plavix in preparation for surgery   DVT prophylaxis: SQ Lovenox Code Status: Full Family Communication: None today Disposition Plan: Status is: Inpatient  Remains inpatient appropriate because:Inpatient level of care appropriate due to severity of illness  Dispo: The patient is from: Home              Anticipated d/c is to: SNF              Patient currently is not medically stable to d/c.   Difficult to place patient No       Level of care: Med-Surg  Consultants:  Orthopedics  Procedures:  ORIF left olecranon planned 9/22 T11 kyphoplasty planned 9/22  Antimicrobials:  None   Subjective: Seen and examined.  Lying in bed.  Pain moderate.  Localized lower back.  Objective: Vitals:   05/10/21 2346 05/11/21 0339 05/11/21 0814 05/11/21 1219  BP: (!) 155/78 (!) 162/82 (!) 172/75 (!) 157/77  Pulse: 92 92 85 89  Resp: 18 18 20 18   Temp: 98.6 F (37 C) 98.7 F (37.1 C) 98.2 F (36.8 C) 98.3 F (36.8 C)  TempSrc: Oral Oral Oral Oral  SpO2: 96% 95% 96% 96%  Weight:       Height:        Intake/Output Summary (Last 24 hours) at 05/11/2021 1231 Last data filed at 05/11/2021 0342 Gross per 24 hour  Intake --  Output 475 ml  Net -475 ml   Filed Weights   05/07/21 2217  Weight: 53.5 kg    Examination:  General exam: Mild distress due to pain Respiratory system: Clear.  Normal work of breathing.  Room air Cardiovascular system: S1-S2, RRR, no murmurs, no pedal edema Gastrointestinal system: Soft, nontender, nondistended, normal bowel sounds Central nervous system: Alert and oriented. No focal neurological deficits. Extremities: Decreased power BLE Skin: No rashes, lesions or ulcers Psychiatry: Judgement and insight appear normal. Mood & affect appropriate.     Data Reviewed: I have personally reviewed following labs and imaging studies  CBC: Recent Labs  Lab 05/07/21 1523 05/08/21 0605 05/09/21 0620  WBC 8.4 9.0 12.3*  NEUTROABS 6.4  --   --   HGB 10.6* 10.0* 9.6*  HCT 31.3* 29.7* 27.6*  MCV 94.6 92.8 93.2  PLT 248 234 811   Basic Metabolic Panel: Recent Labs  Lab 05/07/21 1523 05/08/21 0605 05/09/21 0620 05/10/21 0406 05/11/21 0334  NA 134* 134* 132* 131* 128*  K 4.4 5.0 4.1 3.9 4.2  CL 99 99 100 97* 94*  CO2 27 26 25 26 24   GLUCOSE 223* 260* 191* 166* 291*  BUN 16 15 18 15 13   CREATININE 0.68 0.61 0.51 0.52 0.59  CALCIUM 8.8* 8.7* 8.6* 8.5* 8.1*   GFR: Estimated Creatinine Clearance: 49.7 mL/min (by C-G formula based on SCr of 0.59 mg/dL). Liver Function Tests: Recent Labs  Lab 05/07/21 1523  AST 25  ALT 18  ALKPHOS 78  BILITOT 0.4  PROT 6.9  ALBUMIN 3.6   No results for input(s): LIPASE, AMYLASE in the last 168 hours. No results for input(s): AMMONIA in the last 168 hours. Coagulation Profile: No results for input(s): INR, PROTIME in the last 168 hours. Cardiac Enzymes: Recent Labs  Lab 05/07/21 2201  CKTOTAL 188   BNP (last 3 results) No results for input(s): PROBNP in the last 8760 hours. HbA1C: No  results for input(s): HGBA1C in the last 72 hours. CBG: Recent Labs  Lab 05/10/21 2050 05/10/21 2344 05/11/21 0810 05/11/21 0919 05/11/21 1216  GLUCAP 218* 159* 327* 250* 214*   Lipid Profile: No results for input(s): CHOL, HDL, LDLCALC, TRIG, CHOLHDL, LDLDIRECT in the last 72 hours. Thyroid Function Tests: No results for input(s): TSH, T4TOTAL, FREET4, T3FREE, THYROIDAB in the last 72 hours. Anemia Panel: No results for input(s): VITAMINB12, FOLATE, FERRITIN, TIBC, IRON, RETICCTPCT in the last 72 hours. Sepsis Labs: No results for input(s): PROCALCITON, LATICACIDVEN in the last 168 hours.  Recent Results (from the past 240 hour(s))  Resp Panel by RT-PCR (Flu A&B, Covid) Nasopharyngeal Swab     Status: None   Collection Time: 05/07/21  3:53 PM   Specimen: Nasopharyngeal Swab; Nasopharyngeal(NP) swabs in vial transport medium  Result Value Ref Range Status   SARS Coronavirus 2 by RT PCR NEGATIVE NEGATIVE Final    Comment: (NOTE) SARS-CoV-2 target nucleic acids are NOT DETECTED.  The SARS-CoV-2 RNA is generally detectable in upper respiratory specimens during the acute phase of infection. The lowest concentration of SARS-CoV-2 viral copies this assay can detect is 138 copies/mL. A negative result does not preclude SARS-Cov-2 infection and should not be used as the sole basis for treatment or other patient management decisions. A negative result may occur with  improper specimen collection/handling, submission of specimen other than nasopharyngeal swab, presence of viral mutation(s) within the areas targeted by this assay, and inadequate number of viral copies(<138 copies/mL). A negative result must be combined with clinical observations, patient history, and epidemiological information. The expected result is Negative.  Fact Sheet for Patients:  EntrepreneurPulse.com.au  Fact Sheet for Healthcare Providers:   IncredibleEmployment.be  This test is no t yet approved or cleared by the [place] FDA and  has been authorized for detection and/or diagnosis of SARS-CoV-2 by FDA under an Emergency Use Authorization (EUA). This EUA will remain  in effect (meaning this test can be used) for the duration of the COVID-19 declaration under Section 564(b)(1) of the Act, 21 U.S.C.section 360bbb-3(b)(1), unless the authorization is terminated  or revoked sooner.       Influenza A by PCR NEGATIVE NEGATIVE Final   Influenza B by PCR NEGATIVE NEGATIVE Final    Comment: (NOTE) The Xpert Xpress SARS-CoV-2/FLU/RSV plus assay is intended as an aid in the diagnosis of influenza from Nasopharyngeal swab specimens and should not be used as a sole basis for treatment. Nasal washings and aspirates are unacceptable for Xpert Xpress SARS-CoV-2/FLU/RSV testing.  Fact Sheet for Patients: EntrepreneurPulse.com.au  Fact Sheet for Healthcare Providers: IncredibleEmployment.be  This test is not yet approved or cleared by the [place] FDA and has been authorized for detection and/or diagnosis of SARS-CoV-2 by FDA under an Emergency Use Authorization (EUA). This EUA will remain in effect (meaning this test can be used) for the duration of the COVID-19 declaration under Section 564(b)(1) of the Act, 21 U.S.C. section 360bbb-3(b)(1), unless the authorization is terminated or revoked.  Performed at [facility], [address], [place], [place] [address]   Urine Culture     Status: None   Collection Time: 05/08/21  4:30 AM   Specimen: Urine, Clean Catch  Result Value Ref Range Status   Specimen Description   Final    URINE, CLEAN CATCH Performed at [facility], [address] [place], [place] [address]    Special Requests   Final    NONE Performed at [facility], [address] [place], [place] [address]    Culture    Final    NO GROWTH Performed at [facility], [place] [address], [place],  [address]    Report Status 05/09/2021 FINAL  Final         Radiology Studies: MR THORACIC SPINE WO CONTRAST  Result Date: 05/09/2021 CLINICAL DATA:  Back pain.  Recent fall. EXAM: MRI THORACIC AND LUMBAR SPINE WITHOUT CONTRAST TECHNIQUE: Multiplanar and multiecho pulse sequences of the thoracic and lumbar spine were obtained without intravenous contrast. COMPARISON:  CT chest and lumbar spine dated May 07, 2021. FINDINGS: MRI THORACIC SPINE FINDINGS Alignment:  Physiologic. Vertebrae: Acute to subacute T10 inferior endplate compression fracture with 40% height loss.  No significant retropulsion. No additional fracture. No evidence of discitis or suspicious bone lesion. Cord:  Normal signal and morphology. Paraspinal and other soft tissues: Patchy infiltrate in the right upper and lower lobes. Small bilateral pleural effusions. Disc levels: Tiny central disc protrusion at T7-T8. No spinal canal or neuroforaminal stenosis at any level. MRI LUMBAR SPINE FINDINGS Segmentation:  Standard. Alignment:  Mild dextroscoliosis.  No significant listhesis. Vertebrae: No acute fracture, evidence of discitis, or bone lesion. Old healed sacral fractures. Conus medullaris and cauda equina: Conus extends to the T12-L1 level. Conus and cauda equina appear normal. Paraspinal and other soft tissues: Negative. Disc levels: T12-L1:  Negative. L1-L2: Mild disc bulging with superimposed central disc protrusion. Mild left facet arthropathy. Moderate spinal canal and left neuroforaminal stenosis. No right neuroforaminal stenosis. L2-L3: Mild disc bulging with left far lateral disc osteophyte complex. Mild left facet arthropathy. Mild spinal canal and left neuroforaminal stenosis. No right neuroforaminal stenosis. L3-L4: Moderate right paracentral and subarticular disc protrusion. Mild bilateral facet arthropathy. Moderate to severe spinal  canal and right lateral recess stenosis. Moderate left lateral recess stenosis. No neuroforaminal stenosis. L4-L5: Moderate disc bulging with right far lateral disc osteophyte complex. Moderate bilateral facet arthropathy. Severe spinal canal stenosis. Moderate to severe right neuroforaminal stenosis. Borderline mild left neuroforaminal stenosis. L5-S1: Small shallow broad-based posterior disc protrusion. Mild-to-moderate bilateral facet arthropathy. No stenosis. IMPRESSION: Thoracic spine: 1. Acute to subacute T10 inferior endplate compression fracture with 40% height loss. No significant retropulsion. 2. Small bilateral pleural effusions. Patchy pneumonia in the right upper and lower lobes. Lumbar spine: 1. Multilevel degenerative changes of the lumbar spine as described above. Severe spinal canal stenosis and moderate to severe right neuroforaminal stenosis at L4-L5. 2. Moderate to severe spinal canal and right lateral recess stenosis at L3-L4. 3. Moderate spinal canal stenosis and left neuroforaminal stenosis at L1-L2. Electronically Signed   By: Titus Dubin M.D.   On: 05/09/2021 18:47   MR LUMBAR SPINE WO CONTRAST  Result Date: 05/09/2021 CLINICAL DATA:  Back pain.  Recent fall. EXAM: MRI THORACIC AND LUMBAR SPINE WITHOUT CONTRAST TECHNIQUE: Multiplanar and multiecho pulse sequences of the thoracic and lumbar spine were obtained without intravenous contrast. COMPARISON:  CT chest and lumbar spine dated May 07, 2021. FINDINGS: MRI THORACIC SPINE FINDINGS Alignment:  Physiologic. Vertebrae: Acute to subacute T10 inferior endplate compression fracture with 40% height loss. No significant retropulsion. No additional fracture. No evidence of discitis or suspicious bone lesion. Cord:  Normal signal and morphology. Paraspinal and other soft tissues: Patchy infiltrate in the right upper and lower lobes. Small bilateral pleural effusions. Disc levels: Tiny central disc protrusion at T7-T8. No spinal canal  or neuroforaminal stenosis at any level. MRI LUMBAR SPINE FINDINGS Segmentation:  Standard. Alignment:  Mild dextroscoliosis.  No significant listhesis. Vertebrae: No acute fracture, evidence of discitis, or bone lesion. Old healed sacral fractures. Conus medullaris and cauda equina: Conus extends to the T12-L1 level. Conus and cauda equina appear normal. Paraspinal and other soft tissues: Negative. Disc levels: T12-L1:  Negative. L1-L2: Mild disc bulging with superimposed central disc protrusion. Mild left facet arthropathy. Moderate spinal canal and left neuroforaminal stenosis. No right neuroforaminal stenosis. L2-L3: Mild disc bulging with left far lateral disc osteophyte complex. Mild left facet arthropathy. Mild spinal canal and left neuroforaminal stenosis. No right neuroforaminal stenosis. L3-L4: Moderate right paracentral and subarticular disc protrusion. Mild bilateral facet arthropathy. Moderate to severe spinal canal and right lateral recess stenosis. Moderate left lateral recess stenosis. No  neuroforaminal stenosis. L4-L5: Moderate disc bulging with right far lateral disc osteophyte complex. Moderate bilateral facet arthropathy. Severe spinal canal stenosis. Moderate to severe right neuroforaminal stenosis. Borderline mild left neuroforaminal stenosis. L5-S1: Small shallow broad-based posterior disc protrusion. Mild-to-moderate bilateral facet arthropathy. No stenosis. IMPRESSION: Thoracic spine: 1. Acute to subacute T10 inferior endplate compression fracture with 40% height loss. No significant retropulsion. 2. Small bilateral pleural effusions. Patchy pneumonia in the right upper and lower lobes. Lumbar spine: 1. Multilevel degenerative changes of the lumbar spine as described above. Severe spinal canal stenosis and moderate to severe right neuroforaminal stenosis at L4-L5. 2. Moderate to severe spinal canal and right lateral recess stenosis at L3-L4. 3. Moderate spinal canal stenosis and left  neuroforaminal stenosis at L1-L2. Electronically Signed   By: Titus Dubin M.D.   On: 05/09/2021 18:47        Scheduled Meds:  acetaminophen  1,000 mg Oral TID   vitamin C  500 mg Oral BID   atorvastatin  40 mg Oral Daily   brimonidine  1 drop Right Eye TID   And   timolol  1 drop Right Eye TID   calcium-vitamin D  1 tablet Oral QPM   Chlorhexidine Gluconate Cloth  6 each Topical Daily   cholecalciferol  5,000 Units Oral Daily   gabapentin  300 mg Oral QHS   And   gabapentin  300 mg Oral BID   influenza vaccine adjuvanted  0.5 mL Intramuscular Tomorrow-1000   insulin aspart  0-5 Units Subcutaneous QHS   insulin aspart  0-6 Units Subcutaneous TID WC   insulin aspart  4 Units Subcutaneous TID WC   [START ON 05/12/2021] insulin glargine-yfgn  9 Units Subcutaneous Daily   ketorolac  15 mg Intravenous Q6H   losartan  100 mg Oral QPM   multivitamin with minerals  1 tablet Oral Daily   polyethylene glycol  17 g Oral Daily   senna  1 tablet Oral Daily   sodium chloride flush  3 mL Intravenous Q12H   venlafaxine  75 mg Oral BID   Continuous Infusions:  [START ON 05/12/2021]  ceFAZolin (ANCEF) IV       LOS: 3 days    Time spent: 25 minutes    Sidney Ace, MD [facility] Pager [phone]  If 7PM-7AM, please contact night-coverage 05/11/2021, 12:31 PM

## 2021-05-11 NOTE — Plan of Care (Signed)
  Problem: Education: Goal: Knowledge of General Education information will improve Description: Including pain rating scale, medication(s)/side effects and non-pharmacologic comfort measures Outcome: Progressing   Problem: Health Behavior/Discharge Planning: Goal: Ability to manage health-related needs will improve Outcome: Progressing   Problem: Education: Goal: Ability to describe self-care measures that may prevent or decrease complications (Diabetes Survival Skills Education) will improve Outcome: Progressing Goal: Individualized Educational Video(s) Outcome: Progressing   Problem: Coping: Goal: Ability to adjust to condition or change in health will improve Outcome: Progressing

## 2021-05-12 ENCOUNTER — Inpatient Hospital Stay: Payer: Medicare Other | Admitting: Anesthesiology

## 2021-05-12 ENCOUNTER — Inpatient Hospital Stay: Payer: Medicare Other

## 2021-05-12 ENCOUNTER — Other Ambulatory Visit: Payer: Self-pay

## 2021-05-12 ENCOUNTER — Encounter: Payer: Self-pay | Admitting: Obstetrics and Gynecology

## 2021-05-12 ENCOUNTER — Encounter
Admission: EM | Disposition: A | Discharge status: Skilled Nursing Facility | Payer: Self-pay | Source: Home / Self Care | Attending: Internal Medicine

## 2021-05-12 DIAGNOSIS — W19XXXA Unspecified fall, initial encounter: Secondary | ICD-10-CM | POA: Diagnosis not present

## 2021-05-12 DIAGNOSIS — S52022A Displaced fracture of olecranon process without intraarticular extension of left ulna, initial encounter for closed fracture: Secondary | ICD-10-CM | POA: Diagnosis not present

## 2021-05-12 HISTORY — PX: ORIF ELBOW FRACTURE: SHX5031

## 2021-05-12 HISTORY — PX: KYPHOPLASTY: SHX5884

## 2021-05-12 LAB — BASIC METABOLIC PANEL
Anion gap: 10 (ref 5–15)
BUN: 17 mg/dL (ref 8–23)
CO2: 24 mmol/L (ref 22–32)
Calcium: 8.3 mg/dL — ABNORMAL LOW (ref 8.9–10.3)
Chloride: 90 mmol/L — ABNORMAL LOW (ref 98–111)
Creatinine, Ser: 0.56 mg/dL (ref 0.44–1.00)
GFR, Estimated: >60 mL/min (ref >60)
Glucose, Bld: 271 mg/dL — ABNORMAL HIGH (ref 70–99)
Potassium: 4.3 mmol/L (ref 3.5–5.1)
Sodium: 124 mmol/L — ABNORMAL LOW (ref 135–145)

## 2021-05-12 LAB — GLUCOSE, CAPILLARY
Glucose-Capillary: 299 mg/dL — ABNORMAL HIGH (ref 70–99)
Glucose-Capillary: 224 mg/dL — ABNORMAL HIGH (ref 70–99)
Glucose-Capillary: 122 mg/dL — ABNORMAL HIGH (ref 70–99)
Glucose-Capillary: 64 mg/dL — ABNORMAL LOW (ref 70–99)
Glucose-Capillary: 79 mg/dL (ref 70–99)

## 2021-05-12 SURGERY — OPEN REDUCTION INTERNAL FIXATION (ORIF) ELBOW/OLECRANON FRACTURE
Anesthesia: General | Site: Elbow

## 2021-05-12 MED ORDER — ONDANSETRON HCL 4 MG/2ML IJ SOLN
INTRAMUSCULAR | Status: DC | PRN
  Administered 2021-05-12: 4 mg via INTRAVENOUS

## 2021-05-12 MED ORDER — FENTANYL CITRATE (PF) 100 MCG/2ML IJ SOLN
INTRAMUSCULAR | Status: DC | PRN
  Administered 2021-05-12 (×2): 50 ug via INTRAVENOUS

## 2021-05-12 MED ORDER — PROPOFOL 10 MG/ML IV BOLUS
INTRAVENOUS | Status: DC | PRN
  Administered 2021-05-12: 20 mg via INTRAVENOUS
  Administered 2021-05-12: 30 mg via INTRAVENOUS

## 2021-05-12 MED ORDER — GLYCOPYRROLATE 0.2 MG/ML IJ SOLN
INTRAMUSCULAR | Status: DC | PRN
  Administered 2021-05-12: .2 mg via INTRAVENOUS

## 2021-05-12 MED ORDER — POLYETHYLENE GLYCOL 3350 17 G PO PACK: 17.0000 g | PACK | Freq: Every day | ORAL | Status: DC | PRN

## 2021-05-12 MED ORDER — ONDANSETRON HCL 4 MG/2ML IJ SOLN: 4.0000 mg | Freq: Once | INTRAMUSCULAR | Status: DC | PRN

## 2021-05-12 MED ORDER — LIDOCAINE HCL (PF) 1 % IJ SOLN
INTRAMUSCULAR | Status: AC
  Filled 2021-05-12: qty 30

## 2021-05-12 MED ORDER — FENTANYL CITRATE (PF) 100 MCG/2ML IJ SOLN
INTRAMUSCULAR | Status: AC
  Filled 2021-05-12: qty 2

## 2021-05-12 MED ORDER — FENTANYL CITRATE (PF) 100 MCG/2ML IJ SOLN
25.0000 ug | INTRAMUSCULAR | Status: DC | PRN
  Administered 2021-05-12: 25 ug via INTRAVENOUS

## 2021-05-12 MED ORDER — PHENYLEPHRINE HCL (PRESSORS) 10 MG/ML IV SOLN
INTRAVENOUS | Status: AC
  Filled 2021-05-12: qty 1

## 2021-05-12 MED ORDER — PHENYLEPHRINE HCL (PRESSORS) 10 MG/ML IV SOLN
INTRAVENOUS | Status: DC | PRN
  Administered 2021-05-12 (×3): 100 ug via INTRAVENOUS
  Administered 2021-05-12: 200 ug via INTRAVENOUS
  Administered 2021-05-12 (×4): 100 ug via INTRAVENOUS
  Administered 2021-05-12: 200 ug via INTRAVENOUS

## 2021-05-12 MED ORDER — BUPIVACAINE-EPINEPHRINE (PF) 0.5% -1:200000 IJ SOLN
INTRAMUSCULAR | Status: AC
  Filled 2021-05-12: qty 30

## 2021-05-12 MED ORDER — LABETALOL HCL 5 MG/ML IV SOLN
10.0000 mg | INTRAVENOUS | Status: DC | PRN
  Administered 2021-05-12: 10 mg via INTRAVENOUS
  Filled 2021-05-12: qty 4

## 2021-05-12 MED ORDER — PROPOFOL 10 MG/ML IV BOLUS
INTRAVENOUS | Status: AC
  Filled 2021-05-12: qty 40

## 2021-05-12 MED ORDER — DEXMEDETOMIDINE (PRECEDEX) IN NS 20 MCG/5ML (4 MCG/ML) IV SYRINGE
PREFILLED_SYRINGE | INTRAVENOUS | Status: DC | PRN
  Administered 2021-05-12 (×2): 8 ug via INTRAVENOUS

## 2021-05-12 MED ORDER — PROPOFOL 10 MG/ML IV BOLUS
INTRAVENOUS | Status: AC
  Filled 2021-05-12: qty 20

## 2021-05-12 MED ORDER — CEFAZOLIN SODIUM-DEXTROSE 1-4 GM/50ML-% IV SOLN
INTRAVENOUS | Status: AC
  Filled 2021-05-12: qty 50

## 2021-05-12 MED ORDER — PROPOFOL 500 MG/50ML IV EMUL
INTRAVENOUS | Status: DC | PRN
  Administered 2021-05-12: 50 ug/kg/min via INTRAVENOUS

## 2021-05-12 MED ORDER — LIDOCAINE HCL 1 % IJ SOLN
INTRAMUSCULAR | Status: DC | PRN
  Administered 2021-05-12: 20 mL

## 2021-05-12 MED ORDER — EPHEDRINE SULFATE 50 MG/ML IJ SOLN
INTRAMUSCULAR | Status: DC | PRN
  Administered 2021-05-12: 5 mg via INTRAVENOUS
  Administered 2021-05-12 (×2): 10 mg via INTRAVENOUS

## 2021-05-12 MED ORDER — LIDOCAINE HCL (CARDIAC) PF 100 MG/5ML IV SOSY
PREFILLED_SYRINGE | INTRAVENOUS | Status: DC | PRN
  Administered 2021-05-12: 60 mg via INTRAVENOUS

## 2021-05-12 MED ORDER — ONDANSETRON HCL 4 MG/2ML IJ SOLN: 4.0000 mg | Freq: Four times a day (QID) | INTRAMUSCULAR | Status: DC | PRN

## 2021-05-12 MED ORDER — METOCLOPRAMIDE HCL 5 MG/ML IJ SOLN: 5.0000 mg | Freq: Three times a day (TID) | INTRAMUSCULAR | Status: DC | PRN

## 2021-05-12 MED ORDER — FENTANYL CITRATE (PF) 100 MCG/2ML IJ SOLN: 25.0000 ug | INTRAMUSCULAR | Status: DC | PRN

## 2021-05-12 MED ORDER — GLYCOPYRROLATE 0.2 MG/ML IJ SOLN
INTRAMUSCULAR | Status: AC
  Filled 2021-05-12: qty 1

## 2021-05-12 MED ORDER — FENTANYL CITRATE (PF) 100 MCG/2ML IJ SOLN
INTRAMUSCULAR | Status: AC
  Administered 2021-05-12: 25 ug via INTRAVENOUS
  Filled 2021-05-12: qty 2

## 2021-05-12 MED ORDER — LIDOCAINE HCL (PF) 2 % IJ SOLN
INTRAMUSCULAR | Status: AC
  Filled 2021-05-12: qty 5

## 2021-05-12 MED ORDER — METOCLOPRAMIDE HCL 10 MG PO TABS: 5.0000 mg | ORAL_TABLET | Freq: Three times a day (TID) | ORAL | Status: DC | PRN

## 2021-05-12 MED ORDER — SODIUM CHLORIDE 0.9 % IV SOLN: INTRAVENOUS | Status: DC | PRN

## 2021-05-12 MED ORDER — DEXMEDETOMIDINE (PRECEDEX) IN NS 20 MCG/5ML (4 MCG/ML) IV SYRINGE
PREFILLED_SYRINGE | INTRAVENOUS | Status: AC
  Filled 2021-05-12: qty 5

## 2021-05-12 MED ORDER — DOCUSATE SODIUM 100 MG PO CAPS
100.0000 mg | ORAL_CAPSULE | Freq: Two times a day (BID) | ORAL | Status: DC
  Administered 2021-05-12 – 2021-05-16 (×8): 100 mg via ORAL
  Filled 2021-05-12 (×8): qty 1

## 2021-05-12 MED ORDER — NEOMYCIN-POLYMYXIN B GU 40-200000 IR SOLN
Status: DC | PRN
  Administered 2021-05-12: 2 mL

## 2021-05-12 MED ORDER — EPHEDRINE 5 MG/ML INJ
INTRAVENOUS | Status: AC
  Filled 2021-05-12: qty 5

## 2021-05-12 MED ORDER — BISACODYL 10 MG RE SUPP: 10.0000 mg | Freq: Every day | RECTAL | Status: DC | PRN

## 2021-05-12 MED ORDER — CEFAZOLIN SODIUM-DEXTROSE 1-4 GM/50ML-% IV SOLN
1.0000 g | Freq: Four times a day (QID) | INTRAVENOUS | Status: AC
Start: 2021-05-12 — End: 2021-05-13
  Administered 2021-05-12 – 2021-05-13 (×3): 1 g via INTRAVENOUS
  Filled 2021-05-12 (×3): qty 50

## 2021-05-12 MED ORDER — ONDANSETRON HCL 4 MG PO TABS: 4.0000 mg | ORAL_TABLET | Freq: Four times a day (QID) | ORAL | Status: DC | PRN

## 2021-05-12 MED ORDER — CLOPIDOGREL BISULFATE 75 MG PO TABS
75.0000 mg | ORAL_TABLET | Freq: Every evening | ORAL | Status: DC
  Administered 2021-05-13 – 2021-05-15 (×3): 75 mg via ORAL
  Filled 2021-05-12 (×3): qty 1

## 2021-05-12 MED ORDER — ONDANSETRON HCL 4 MG/2ML IJ SOLN
INTRAMUSCULAR | Status: AC
  Filled 2021-05-12: qty 2

## 2021-05-12 MED ORDER — IOHEXOL 180 MG/ML  SOLN
INTRAMUSCULAR | Status: DC | PRN
  Administered 2021-05-12: 10 mL

## 2021-05-12 MED ORDER — BUPIVACAINE-EPINEPHRINE (PF) 0.5% -1:200000 IJ SOLN
INTRAMUSCULAR | Status: DC | PRN
  Administered 2021-05-12: 10 mL

## 2021-05-12 MED ORDER — SODIUM CHLORIDE (PF) 0.9 % IJ SOLN
INTRAMUSCULAR | Status: DC | PRN
  Administered 2021-05-12: 20 mL

## 2021-05-12 SURGICAL SUPPLY — 68 items
ADH SKN CLS APL DERMABOND .7 (GAUZE/BANDAGES/DRESSINGS) ×2
APL PRP STRL LF DISP 70% ISPRP (MISCELLANEOUS) ×2
BIT DRILL CALIBRATED 2.7 (BIT) ×3 IMPLANT
BNDG CMPR STD VLCR NS LF 5.8X4 (GAUZE/BANDAGES/DRESSINGS) ×4
BNDG ELASTIC 4X5.8 VLCR NS LF (GAUZE/BANDAGES/DRESSINGS) ×6 IMPLANT
CEMENT KYPHON CX01A KIT/MIXER (Cement) ×3 IMPLANT
CHLORAPREP W/TINT 26 (MISCELLANEOUS) ×3 IMPLANT
CUFF TOURN SGL QUICK 18X4 (TOURNIQUET CUFF) IMPLANT
CUFF TOURN SGL QUICK 24 (TOURNIQUET CUFF)
CUFF TRNQT CYL 24X4X16.5-23 (TOURNIQUET CUFF) IMPLANT
DERMABOND ADVANCED (GAUZE/BANDAGES/DRESSINGS) ×1
DERMABOND ADVANCED .7 DNX12 (GAUZE/BANDAGES/DRESSINGS) ×2 IMPLANT
DEVICE BIOPSY BONE KYPHX (INSTRUMENTS) ×3 IMPLANT
DRAPE 3/4 80X56 (DRAPES) ×3 IMPLANT
DRAPE C-ARM XRAY 36X54 (DRAPES) ×3 IMPLANT
DRAPE FLUOR MINI C-ARM 54X84 (DRAPES) ×3 IMPLANT
DURAPREP 26ML APPLICATOR (WOUND CARE) ×3 IMPLANT
ELECT CAUTERY BLADE 6.4 (BLADE) ×3 IMPLANT
ELECT REM PT RETURN 9FT ADLT (ELECTROSURGICAL) ×3
ELECTRODE REM PT RTRN 9FT ADLT (ELECTROSURGICAL) ×2 IMPLANT
GAUZE 4X4 16PLY ~~LOC~~+RFID DBL (SPONGE) ×3 IMPLANT
GAUZE SPONGE 4X4 12PLY STRL (GAUZE/BANDAGES/DRESSINGS) ×3 IMPLANT
GAUZE XEROFORM 1X8 LF (GAUZE/BANDAGES/DRESSINGS) ×3 IMPLANT
GLOVE SURG SYN 9.0  PF PI (GLOVE) ×1
GLOVE SURG SYN 9.0 PF PI (GLOVE) ×2 IMPLANT
GOWN SRG 2XL LVL 4 RGLN SLV (GOWNS) ×2 IMPLANT
GOWN STRL NON-REIN 2XL LVL4 (GOWNS) ×3
GOWN STRL REUS W/ TWL LRG LVL3 (GOWN DISPOSABLE) ×2 IMPLANT
GOWN STRL REUS W/TWL LRG LVL3 (GOWN DISPOSABLE) ×3
K-WIRE FIXATION 2.0X6 (WIRE) ×3
KIT TURNOVER KIT A (KITS) ×3 IMPLANT
KWIRE FIXATION 2.0X6 (WIRE) ×2 IMPLANT
MANIFOLD NEPTUNE II (INSTRUMENTS) ×3 IMPLANT
NEEDLE FILTER BLUNT 18X 1/2SAF (NEEDLE) ×1
NEEDLE FILTER BLUNT 18X1 1/2 (NEEDLE) ×2 IMPLANT
NS IRRIG 500ML POUR BTL (IV SOLUTION) ×3 IMPLANT
PACK EXTREMITY ARMC (MISCELLANEOUS) ×3 IMPLANT
PACK KYPHOPLASTY (MISCELLANEOUS) ×3 IMPLANT
PAD ABD DERMACEA PRESS 5X9 (GAUZE/BANDAGES/DRESSINGS) ×6 IMPLANT
PAD CAST CTTN 4X4 STRL (SOFTGOODS) ×4 IMPLANT
PAD PREP 24X41 OB/GYN DISP (PERSONAL CARE ITEMS) IMPLANT
PADDING CAST COTTON 4X4 STRL (SOFTGOODS) ×6
PLATE OLECRANON SM (Plate) ×3 IMPLANT
RENTAL RFA GENERATOR (MISCELLANEOUS) IMPLANT
SCALPEL PROTECTED #15 DISP (BLADE) ×6 IMPLANT
SCREW LOCK 3.5X20 DIST TIB (Screw) ×3 IMPLANT
SCREW LOCK CORT STAR 3.5X14 (Screw) ×3 IMPLANT
SCREW LOCK CORT STAR 3.5X16 (Screw) ×3 IMPLANT
SCREW LOCK CORT STAR 3.5X20 (Screw) ×3 IMPLANT
SCREW LOCK CORT STAR 3.5X24 (Screw) ×3 IMPLANT
SCREW LOCK CORT STAR 3.5X50 (Screw) ×3 IMPLANT
SCREW NON LOCKING LP 3.5 16MM (Screw) ×3 IMPLANT
SPLINT CAST 1 STEP 3X12 (MISCELLANEOUS) ×6 IMPLANT
SPLINT CAST 1 STEP 5X30 WHT (MISCELLANEOUS) IMPLANT
SPONGE T-LAP 18X18 ~~LOC~~+RFID (SPONGE) ×3 IMPLANT
STAPLER SKIN PROX 35W (STAPLE) IMPLANT
STRAP SAFETY 5IN WIDE (MISCELLANEOUS) ×3 IMPLANT
STRIP CLOSURE SKIN 1/2X4 (GAUZE/BANDAGES/DRESSINGS) IMPLANT
SUT ETHILON 3-0 FS-10 30 BLK (SUTURE) ×3
SUT FIBERWIRE #2 38 T-5 BLUE (SUTURE) ×3
SUT VIC AB 0 CT2 27 (SUTURE) ×3 IMPLANT
SUT VIC AB 2-0 CT2 27 (SUTURE) ×3 IMPLANT
SUTURE EHLN 3-0 FS-10 30 BLK (SUTURE) ×2 IMPLANT
SUTURE FIBERWR #2 38 T-5 BLUE (SUTURE) ×2 IMPLANT
SWABSTK COMLB BENZOIN TINCTURE (MISCELLANEOUS) ×3 IMPLANT
SYR 5ML LL (SYRINGE) ×3 IMPLANT
TRAY KYPHOPAK 15/3 EXPRESS 1ST (MISCELLANEOUS) ×3 IMPLANT
WATER STERILE IRR 500ML POUR (IV SOLUTION) ×3 IMPLANT

## 2021-05-12 NOTE — TOC Progression Note (Signed)
Transition of Care [facility]) - Progression Note    Patient Details  Name: Erin Daniels MRN: [id number] Date of Birth: 13-May-1946  [facility] CM/SW Contact  Su Hilt, RN Phone Number: 05/12/2021, 11:29 AM  Clinical Narrative:     The patient is from home and lives with her spouse,  She has a Conservation officer, nature at home, She is to have elbow ORIF and Kyphoplasty today, PT to evaluate afterwards for recommendations, TOC will monitor for needs       Expected Discharge Plan and Services                                                 Social Determinants of Health (SDOH) Interventions    Readmission Risk Interventions Readmission Risk Prevention Plan 12/07/2018  Transportation Screening Complete  HRI or Home Care Consult Complete  Social Work Consult for [place] Planning/Counseling Complete  Medication Review Press photographer) Complete  Some recent data might be hidden

## 2021-05-12 NOTE — Progress Notes (Signed)
Discussed procedure again with patient.  All questions answered.  Left elbow ORIF and T 11 kyphoplasty planned today.

## 2021-05-12 NOTE — Anesthesia Preprocedure Evaluation (Signed)
Anesthesia Evaluation  Patient identified by MRN, date of birth, ID band Patient awake    Reviewed: Allergy & Precautions, H&P , NPO status , Patient's Chart, lab work & pertinent test results, reviewed documented beta blocker date and time   History of Anesthesia Complications Negative for: history of anesthetic complications  Airway Mallampati: II  TM Distance: >3 FB Neck ROM: full    Dental  (+) Poor Dentition   Pulmonary neg pulmonary ROS, neg sleep apnea, neg COPD, Patient abstained from smoking.Not current smoker,    Pulmonary exam normal        Cardiovascular Exercise Tolerance: Good METShypertension, On Medications + angina with exertion (-) CAD and (-) Past MI Normal cardiovascular exam(-) dysrhythmias  Rhythm:regular Rate:Normal     Neuro/Psych  Headaches, Seizures -, Well Controlled,  PSYCHIATRIC DISORDERS Anxiety Depression  Neuromuscular disease CVA, No Residual Symptoms    GI/Hepatic negative GI ROS, Neg liver ROS, neg GERD  ,  Endo/Other  diabetes, Poorly Controlled, Type 1, Insulin DependentHypothyroidism   Renal/GU negative Renal ROS  negative genitourinary   Musculoskeletal   Abdominal   Peds  Hematology  (+) Blood dyscrasia, anemia ,   Anesthesia Other Findings Past Medical History: No date: Angina pectoris (HCC) No date: At high risk for falls No date: Cancer ([place]) No date: Carpal tunnel syndrome     Comment:  BILATERAL No date: Cervicalgia No date: Complication of anesthesia     Comment:  difficult to arouse No date: Diabetes mellitus without complication (HCC)     Comment:  Type I No date: Diabetic neuropathy (HCC) No date: Diabetic neuropathy (HCC) No date: Diabetic retinopathy ([place]) No date: Diabetic retinopathy ([place]) No date: Dizziness No date: Gastroparesis No date: Headache     Comment:  MIGRAINE No date: Herniated disc, cervical No date: Hypercholesteremia No date:  Hypertension No date: Hypokalemia No date: Hypotensive episode No date: Hypothyroidism No date: Insulin pump in place No date: Myofascial muscle pain No date: Numbness of arm     Comment:  left No date: Osteoporosis No date: Poor vision No date: Rosacea No date: Seizures ([place]) No date: Slurred speech No date: Stroke (cerebrum) ([place])     Comment:  4/19 No date: Stroke [facility])     Comment:  x5 No date: Vertigo No date: Wears glasses Past Surgical History: No date: APPENDECTOMY 2011: AUGMENTATION MAMMAPLASTY; Bilateral No date: BILATERAL CARPAL TUNNEL RELEASE No date: BITRECTOMY     Comment:  MEMBRAIN PEEL EYE No date: COLONOSCOPY No date: EYE SURGERY; Bilateral     Comment:  cataract No date: FRACTURE SURGERY No date: HEMORROIDECTOMY No date: HERNIA REPAIR     Comment:  umbilical 9/62/2297: INTRAMEDULLARY (IM) NAIL INTERTROCHANTERIC; Right     Comment:  Procedure: INTRAMEDULLARY (IM) NAIL INTERTROCHANTRIC;                Surgeon: Thornton Park, MD;  Location: [facility];                Service: Orthopedics;  Laterality: Right; No date: JOINT REPLACEMENT     Comment:  right hip 05/29/2015: OPEN REDUCTION INTERNAL FIXATION (ORIF) DISTAL RADIAL  FRACTURE; Right     Comment:  Procedure: OPEN REDUCTION INTERNAL FIXATION (ORIF) RIGHT              DISTAL RADIUS FRACTURE WITH ALLOGRAFT BONE GRAFTING FOR               REPAIR AND RECONSTRUCTION;  Surgeon: Roseanne Kaufman, MD;  Location: [facility];  Service: Orthopedics;  Laterality:               Right; 01/09/2019: OPEN REDUCTION INTERNAL FIXATION (ORIF) METACARPAL; Right     Comment:  Procedure: OPEN REDUCTION INTERNAL FIXATION (ORIF) RIGHT              SECOND METACARPAL FRACTURE;  Surgeon: Corky Mull, MD;               Location: [facility];  Service: Orthopedics;  Laterality:               Right; No date: SHOULDER ARTHROSCOPY; Left No date: SHOULDER HEMI-ARTHROPLASTY; Left No date: SYNOVECTOMY No date:  TONSILLECTOMY No date: TRIGGER FINGER RELEASE No date: TUBAL LIGATION BMI    Body Mass Index: 19.84 kg/m     Reproductive/Obstetrics negative OB ROS                             Anesthesia Physical  Anesthesia Plan  ASA: III  Anesthesia Plan: General   Post-op Pain Management:    Induction: Intravenous  PONV Risk Score and Plan: 4 or greater and Ondansetron and Dexamethasone  Airway Management Planned: LMA  Additional Equipment: None  Intra-op Plan:   Post-operative Plan: Extubation in OR  Informed Consent: I have reviewed the patients History and Physical, chart, labs and discussed the procedure including the risks, benefits and alternatives for the proposed anesthesia with the patient or authorized representative who has indicated his/her understanding and acceptance.     Dental Advisory Given  Plan Discussed with: CRNA  Anesthesia Plan Comments: (Discussed risks of anesthesia with patient, including PONV, sore throat, lip/dental damage. Rare risks discussed as well, such as cardiorespiratory and neurological sequelae, and allergic reactions. Patient understands.)        Anesthesia Quick Evaluation

## 2021-05-12 NOTE — Anesthesia Procedure Notes (Signed)
Procedure Name: LMA Insertion Date/Time: 05/12/2021 6:16 PM Performed by: Aline Brochure, CRNA Pre-anesthesia Checklist: Patient identified, Patient being monitored, Timeout performed, Emergency Drugs available and Suction available Patient Re-evaluated:Patient Re-evaluated prior to induction Oxygen Delivery Method: Circle system utilized Preoxygenation: Pre-oxygenation with 100% oxygen Induction Type: IV induction Ventilation: Mask ventilation without difficulty LMA: LMA inserted LMA Size: 3.5 Tube type: Oral Number of attempts: 1 Placement Confirmation: positive ETCO2 and breath sounds checked- equal and bilateral Tube secured with: Tape Dental Injury: Teeth and Oropharynx as per pre-operative assessment

## 2021-05-12 NOTE — Progress Notes (Signed)
PROGRESS NOTE    Erin Daniels  NID:[id number] DOB: 30-Jan-1946 DOA: 05/07/2021 PCP: Juluis Pitch, MD    Brief Narrative:  75 y.o. female with medical history significant for insulin-dependent diabetes mellitus, history of CVA, diabetic retinopathy, hypertension and hypothyroidism who was brought into the ER by her husband for evaluation following a fall the night prior to her admission. Patient was started on antibiotic therapy 2 days prior to this hospitalization for UTI and was also placed on tramadol for pain control.  At baseline she usually ambulates with a rolling walker and according to the husband she had gone to use the bathroom and on her way out forgot to take her walker and fell landing on her left elbow.  He was able to assist her up but noticed significant swelling involving the left elbow and then patient started complaining of pain in the right lateral chest wall.  She rated her pain a 6 to 7 x 10 in intensity at its worst and left elbow pain is worse with any form of movement activity. She complains of urinary frequency.  Orthopedics on consult with plan for ORIF of left olecranon and kyphoplasty at T11.  Surgical intervention planned for 9/22 as patient needed time for Plavix washout.   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 ([place])   Essential hypertension   Depression   Stroke [facility])   Hypothyroidism   Olecranon fracture, left, closed, initial encounter   Fracture of one rib, right side, initial encounter for closed fracture   Olecranon fracture  Fracture of left olecranon process Ortho following Surgery planned 9/22 Plan: As needed pain control Continue holding Plavix OR today  Delirium 2/2 pain, narcotics - delirium precautions - minimize opioids to the extent we can - gabapentin dose decreased -Added Toradol for pain control   Right axillary  lymphadenopathy Read says concerning for malignancy. 11/2020 mammo birads 1 - will need outpt f/u   Urinary retention Persistent PVRs >300, has required I/O cath multiple times -Foley in place, continue for now - Will attempt voiding trial post op    Right 7th rib fracture T10 compression fracture Ortho following Plan for kyphoplasty 9/22 Plavix on hold    T1DM On insulin pump at home. Glucose mildly elevated this am. Pump has been removed - dm educator consulted -Tested sliding scale, NovoLog with meals, Lantus 10 units daily   Bacteriuria Abnormal ua on 9/15 at pcp's office. Pt denies uti symptoms. Given ceftriaxone here, now discontinued. Urine culture negative   HTN Here bp elevated -Continue losartan 100 daily -Ensure pain control   Chronic pain - home gabapentin (decreased evening dose due to sedation) and effexor    Hx CVA - cont home statin - hold asa/plavix in preparation for surgery   DVT prophylaxis: SQ Lovenox Code Status: Full Family Communication:  left VM for spouse [name] [phone] on 9/22 Disposition Plan: Status is: Inpatient  Remains inpatient appropriate because:Inpatient level of care appropriate due to severity of illness  Dispo: The patient is from: Home              Anticipated d/c is to: SNF              Patient currently is not medically stable to d/c.   Difficult to place patient No       Level of care: Med-Surg  Consultants:  Orthopedics  Procedures:  ORIF  left olecranon 9/22 T11 kyphoplasty 9/22  Antimicrobials:  None   Subjective: Patient seen and examined.  Resting in bed.  Answers all questions appropriately.  Not delirious.  Pain reported as moderate.  Objective: Vitals:   05/11/21 2053 05/11/21 2334 05/12/21 0415 05/12/21 1016  BP: (!) 150/74 (!) 172/80 (!) 182/84 (!) 141/81  Pulse: 81 85 92 85  Resp: 20 20 18 15   Temp: 98.9 F (37.2 C) 98.8 F (37.1 C) 98.9 F (37.2 C) 98.3 F (36.8 C)  TempSrc:  Oral     SpO2: 97% 95% 95% 97%  Weight:      Height:        Intake/Output Summary (Last 24 hours) at 05/12/2021 1052 Last data filed at 05/12/2021 0817 Gross per 24 hour  Intake --  Output 2600 ml  Net -2600 ml   Filed Weights   05/07/21 2217  Weight: 53.5 kg    Examination:  General exam: No acute distress.  Lying in bed Respiratory system: Clear.  Normal work of breathing.  Room air Cardiovascular system: S1-S2, RRR, no murmurs, no pedal edema Gastrointestinal system: Soft, nontender, nondistended, normal bowel sounds Central nervous system: Alert and oriented. No focal neurological deficits. Extremities: Decreased power BLE.  Decreased range of motion BLE Skin: No rashes, lesions or ulcers Psychiatry: Judgement and insight appear normal. Mood & affect appropriate.     Data Reviewed: I have personally reviewed following labs and imaging studies  CBC: Recent Labs  Lab 05/07/21 1523 05/08/21 0605 05/09/21 0620  WBC 8.4 9.0 12.3*  NEUTROABS 6.4  --   --   HGB 10.6* 10.0* 9.6*  HCT 31.3* 29.7* 27.6*  MCV 94.6 92.8 93.2  PLT 248 234 850   Basic Metabolic Panel: Recent Labs  Lab 05/08/21 0605 05/09/21 0620 05/10/21 0406 05/11/21 0334 05/12/21 0456  NA 134* 132* 131* 128* 124*  K 5.0 4.1 3.9 4.2 4.3  CL 99 100 97* 94* 90*  CO2 26 25 26 24 24   GLUCOSE 260* 191* 166* 291* 271*  BUN 15 18 15 13 17   CREATININE 0.61 0.51 0.52 0.59 0.56  CALCIUM 8.7* 8.6* 8.5* 8.1* 8.3*   GFR: Estimated Creatinine Clearance: 49.7 mL/min (by C-G formula based on SCr of 0.56 mg/dL). Liver Function Tests: Recent Labs  Lab 05/07/21 1523  AST 25  ALT 18  ALKPHOS 78  BILITOT 0.4  PROT 6.9  ALBUMIN 3.6   No results for input(s): LIPASE, AMYLASE in the last 168 hours. No results for input(s): AMMONIA in the last 168 hours. Coagulation Profile: No results for input(s): INR, PROTIME in the last 168 hours. Cardiac Enzymes: Recent Labs  Lab 05/07/21 2201  CKTOTAL 188   BNP  (last 3 results) No results for input(s): PROBNP in the last 8760 hours. HbA1C: No results for input(s): HGBA1C in the last 72 hours. CBG: Recent Labs  Lab 05/11/21 0919 05/11/21 1216 05/11/21 1628 05/11/21 2040 05/12/21 0739  GLUCAP 250* 214* 109* 150* 299*   Lipid Profile: No results for input(s): CHOL, HDL, LDLCALC, TRIG, CHOLHDL, LDLDIRECT in the last 72 hours. Thyroid Function Tests: No results for input(s): TSH, T4TOTAL, FREET4, T3FREE, THYROIDAB in the last 72 hours. Anemia Panel: No results for input(s): VITAMINB12, FOLATE, FERRITIN, TIBC, IRON, RETICCTPCT in the last 72 hours. Sepsis Labs: No results for input(s): PROCALCITON, LATICACIDVEN in the last 168 hours.  Recent Results (from the past 240 hour(s))  Resp Panel by RT-PCR (Flu A&B, Covid) Nasopharyngeal Swab     Status:  None   Collection Time: 05/07/21  3:53 PM   Specimen: Nasopharyngeal Swab; Nasopharyngeal(NP) swabs in vial transport medium  Result Value Ref Range Status   SARS Coronavirus 2 by RT PCR NEGATIVE NEGATIVE Final    Comment: (NOTE) SARS-CoV-2 target nucleic acids are NOT DETECTED.  The SARS-CoV-2 RNA is generally detectable in upper respiratory specimens during the acute phase of infection. The lowest concentration of SARS-CoV-2 viral copies this assay can detect is 138 copies/mL. A negative result does not preclude SARS-Cov-2 infection and should not be used as the sole basis for treatment or other patient management decisions. A negative result may occur with  improper specimen collection/handling, submission of specimen other than nasopharyngeal swab, presence of viral mutation(s) within the areas targeted by this assay, and inadequate number of viral copies(<138 copies/mL). A negative result must be combined with clinical observations, patient history, and epidemiological information. The expected result is Negative.  Fact Sheet for Patients:   EntrepreneurPulse.com.au  Fact Sheet for Healthcare Providers:  IncredibleEmployment.be  This test is no t yet approved or cleared by the [place] FDA and  has been authorized for detection and/or diagnosis of SARS-CoV-2 by FDA under an Emergency Use Authorization (EUA). This EUA will remain  in effect (meaning this test can be used) for the duration of the COVID-19 declaration under Section 564(b)(1) of the Act, 21 U.S.C.section 360bbb-3(b)(1), unless the authorization is terminated  or revoked sooner.       Influenza A by PCR NEGATIVE NEGATIVE Final   Influenza B by PCR NEGATIVE NEGATIVE Final    Comment: (NOTE) The Xpert Xpress SARS-CoV-2/FLU/RSV plus assay is intended as an aid in the diagnosis of influenza from Nasopharyngeal swab specimens and should not be used as a sole basis for treatment. Nasal washings and aspirates are unacceptable for Xpert Xpress SARS-CoV-2/FLU/RSV testing.  Fact Sheet for Patients: EntrepreneurPulse.com.au  Fact Sheet for Healthcare Providers: IncredibleEmployment.be  This test is not yet approved or cleared by the [place] FDA and has been authorized for detection and/or diagnosis of SARS-CoV-2 by FDA under an Emergency Use Authorization (EUA). This EUA will remain in effect (meaning this test can be used) for the duration of the COVID-19 declaration under Section 564(b)(1) of the Act, 21 U.S.C. section 360bbb-3(b)(1), unless the authorization is terminated or revoked.  Performed at [facility], [address] [place], [place] [address]   Urine Culture     Status: None   Collection Time: 05/08/21  4:30 AM   Specimen: Urine, Clean Catch  Result Value Ref Range Status   Specimen Description   Final    URINE, CLEAN CATCH Performed at [facility], [address] [place], [place] [address]    Special Requests   Final    NONE Performed  at [facility], [address], [place], [place] [address]    Culture   Final    NO GROWTH Performed at [facility], [place] [address], [place], [place] [address]    Report Status 05/09/2021 FINAL  Final         Radiology Studies: No results found.      Scheduled Meds:  acetaminophen  1,000 mg Oral TID   atorvastatin  40 mg Oral Daily   brimonidine  1 drop Right Eye TID   And   timolol  1 drop Right Eye TID   calcium-vitamin D  1 tablet Oral QPM   Chlorhexidine Gluconate Cloth  6 each Topical Daily  cholecalciferol  5,000 Units Oral Daily   gabapentin  300 mg Oral QHS   And   gabapentin  300 mg Oral BID   insulin aspart  0-5 Units Subcutaneous QHS   insulin aspart  0-6 Units Subcutaneous TID WC   insulin aspart  4 Units Subcutaneous TID WC   insulin glargine-yfgn  9 Units Subcutaneous Daily   ketorolac  15 mg Intravenous Q6H   losartan  100 mg Oral QPM   multivitamin with minerals  1 tablet Oral Daily   polyethylene glycol  17 g Oral Daily   senna  1 tablet Oral Daily   sodium chloride flush  3 mL Intravenous Q12H   venlafaxine  75 mg Oral BID   Continuous Infusions:   ceFAZolin (ANCEF) IV       LOS: 4 days    Time spent: 25 minutes    Sidney Ace, MD [facility] Pager [phone]  If 7PM-7AM, please contact night-coverage 05/12/2021, 10:52 AM

## 2021-05-12 NOTE — Progress Notes (Signed)
PT Cancellation Note  Patient Details Name: Erin Daniels MRN: [id number] DOB: 27-Jan-1946   Cancelled Treatment:    Reason Eval/Treat Not Completed: Patient at procedure or test/unavailable;Patient not medically ready (Pt going to OR today. WIll sign off and await new postoperative PT orders to be placed.) 8:21 AM, 05/12/21 Etta Grandchild, PT, DPT Physical Therapist - [facility]  [phone] ([place])     [place] [facility] 05/12/2021, 8:21 AM

## 2021-05-12 NOTE — Transfer of Care (Signed)
Immediate Anesthesia Transfer of Care Note  Patient: Erin Daniels  Procedure(s) Performed: OPEN REDUCTION INTERNAL FIXATION (ORIF) ELBOW/OLECRANON FRACTURE (Left: Elbow) KYPHOPLASTY - T11  Patient Location: PACU  Anesthesia Type:General  Level of Consciousness: awake, alert  and oriented  Airway & Oxygen Therapy: Patient Spontanous Breathing and Patient connected to face mask oxygen  Post-op Assessment: Report given to RN and Post -op Vital signs reviewed and stable  Post vital signs: Reviewed and stable  Last Vitals:  Vitals Value Taken Time  BP 97/52 05/12/21 1825  Temp    Pulse 81 05/12/21 1829  Resp    SpO2 99 % 05/12/21 1829  Vitals shown include unvalidated device data.  Last Pain:  Vitals:   05/12/21 1503  TempSrc: Temporal  PainSc: 5       Patients Stated Pain Goal: 3 (84/53/64 6803)  Complications: No notable events documented.

## 2021-05-12 NOTE — Anesthesia Postprocedure Evaluation (Signed)
Anesthesia Post Note  Patient: Erin Daniels  Procedure(s) Performed: OPEN REDUCTION INTERNAL FIXATION (ORIF) ELBOW/OLECRANON FRACTURE (Left: Elbow) KYPHOPLASTY - T11  Patient location during evaluation: PACU Anesthesia Type: General Level of consciousness: awake and alert Pain management: pain level controlled Vital Signs Assessment: post-procedure vital signs reviewed and stable Respiratory status: spontaneous breathing, nonlabored ventilation, respiratory function stable and patient connected to nasal cannula oxygen Cardiovascular status: blood pressure returned to baseline and stable Postop Assessment: no apparent nausea or vomiting Anesthetic complications: no   No notable events documented.   Last Vitals:  Vitals:   05/12/21 1845 05/12/21 1900  BP: (!) 94/54 (!) 95/47  Pulse: 78 77  Resp: 17 13  Temp:    SpO2: 96% 92%    Last Pain:  Vitals:   05/12/21 1845  TempSrc:   PainSc: 9                  Arita Miss

## 2021-05-12 NOTE — Op Note (Signed)
05/12/2021  6:32 PM  PATIENT:  Erin Daniels  75 y.o. female  PRE-OPERATIVE DIAGNOSIS:  ORIF LEFT OLECRANON; T10 FRACTURE, traumatic compression fracture  POST-OPERATIVE DIAGNOSIS:  ORIF LEFT OLECRANON; T10 FRACTURE, traumatic compression fracture  PROCEDURE: T10 kyphoplasty open reduction internal fixation left olecranon  SURGEON: Laurene Footman, MD  ASSISTANTS: None  ANESTHESIA:   local, general, and MAC  EBL:  Total I/O In: 1050 [I.V.:1000; IV Piggyback:50] Out: 350 [Urine:350]  BLOOD ADMINISTERED:none  DRAINS: none   LOCAL MEDICATIONS USED:  MARCAINE    and XYLOCAINE   SPECIMEN: T10 vertebral body biopsy  DISPOSITION OF SPECIMEN:  PATHOLOGY  COUNTS:  YES  TOURNIQUET: No tourniquet utilized  IMPLANTS: Bone cement at T10, short Alps olecranon plate with multiple screws  DICTATION: .patient was brought to the operating room and after adequate anesthesia was obtained the patient was placed prone.  C arm was brought in in good visualization of the affected level obtained on both AP and lateral projections.  After patient identification and timeout procedures were completed, local anesthetic was infiltrated with 10 cc 1% Xylocaine infiltrated subcutaneously.  This is done the area on the each side of the planned approach.  The back was then prepped and draped in the usual sterile manner and repeat timeout procedure carried out.  A spinal needle was brought down to the pedicle on the each side of T10 and a 50-50 mix of 1% Xylocaine half percent Sensorcaine with epinephrine total of 20 cc injected on each side.  After allowing this to set a small incision was made and the trocar was advanced into the vertebral body in an extrapedicular fashion.  Biopsy was obtained drilling w across the midline so second stick was not required.  When the cement was appropriate consistency 4-1/2 cc were injected on the right into the vertebral body without extravasation, good fill superior to  inferior endplates and from right to left sides along the inferior endplate.  After the cement had set the trochar was removed and permanent C-arm views obtained.  The wound was closed with Dermabond followed by Band-Aid.  Following this the patient was transferred back to her bed and then placed supine for the remaining procedure.  The left arm was prepped and draped in the usual sterile fashion after general anesthesia was obtained and posterior approach was utilized curving around the radial side of the olecranon with a flap elevated the fracture was fairly small proximally and a suture was placed through the triceps tendon to help reduce the fracture with this in place the short plate was applied with a K wire through the tip of the olecranon going down the shaft.  This gave acceptable initial provisional fixation and screws were planned placed in the shaft holding the plate down in the appropriate position drilling measuring and placing the locking screws only one of the proximal screws could be utilized as a second screw would not of had any good bone for fixation.  Multidirectional screw was placed followed by a 50 mm screw down the shaft locking screws through the tip of the olecranon.  The suture was tied tight and this is gave additional fixation and under fluoroscopic views the elbow was stable the wound was thoroughly irrigated with all remaining tabs for the [place] system removed as they were not required.  The wound was then closed with 2-0 Vicryl subcutaneously followed by skin staples Xeroform 4 x 4's web roll and a long-arm splint with the elbow in 30  degrees flexion to minimize pressure on the skin.    PLAN OF CARE: Admit to inpatient   PATIENT DISPOSITION:  PACU - hemodynamically stable.

## 2021-05-13 ENCOUNTER — Encounter: Payer: Self-pay | Admitting: Orthopedic Surgery

## 2021-05-13 DIAGNOSIS — W19XXXD Unspecified fall, subsequent encounter: Secondary | ICD-10-CM

## 2021-05-13 LAB — GLUCOSE, CAPILLARY
Glucose-Capillary: 109 mg/dL — ABNORMAL HIGH (ref 70–99)
Glucose-Capillary: 206 mg/dL — ABNORMAL HIGH (ref 70–99)
Glucose-Capillary: 230 mg/dL — ABNORMAL HIGH (ref 70–99)
Glucose-Capillary: 233 mg/dL — ABNORMAL HIGH (ref 70–99)
Glucose-Capillary: 242 mg/dL — ABNORMAL HIGH (ref 70–99)
Glucose-Capillary: 321 mg/dL — ABNORMAL HIGH (ref 70–99)
Glucose-Capillary: 99 mg/dL (ref 70–99)

## 2021-05-13 LAB — BASIC METABOLIC PANEL
Anion gap: 8 (ref 5–15)
BUN: 14 mg/dL (ref 8–23)
CO2: 24 mmol/L (ref 22–32)
Calcium: 7.6 mg/dL — ABNORMAL LOW (ref 8.9–10.3)
Chloride: 96 mmol/L — ABNORMAL LOW (ref 98–111)
Creatinine, Ser: 0.59 mg/dL (ref 0.44–1.00)
GFR, Estimated: >60 mL/min (ref >60)
Glucose, Bld: 243 mg/dL — ABNORMAL HIGH (ref 70–99)
Potassium: 4.7 mmol/L (ref 3.5–5.1)
Sodium: 128 mmol/L — ABNORMAL LOW (ref 135–145)

## 2021-05-13 MED ORDER — INSULIN GLARGINE-YFGN 100 UNIT/ML ~~LOC~~ SOLN
11.0000 [IU] | Freq: Every day | SUBCUTANEOUS | Status: DC
  Administered 2021-05-14 – 2021-05-16 (×3): 11 [IU] via SUBCUTANEOUS
  Filled 2021-05-13 (×3): qty 0.11

## 2021-05-13 MED ORDER — INSULIN GLARGINE-YFGN 100 UNIT/ML ~~LOC~~ SOLN
2.0000 [IU] | Freq: Once | SUBCUTANEOUS | Status: AC
  Administered 2021-05-13: 2 [IU] via SUBCUTANEOUS
  Filled 2021-05-13: qty 0.02

## 2021-05-13 MED ORDER — TAMSULOSIN HCL 0.4 MG PO CAPS
0.4000 mg | ORAL_CAPSULE | Freq: Every day | ORAL | Status: DC
  Administered 2021-05-13 – 2021-05-16 (×4): 0.4 mg via ORAL
  Filled 2021-05-13 (×4): qty 1

## 2021-05-13 NOTE — Evaluation (Signed)
Physical Therapy Re-Evaluation Patient Details Name: Erin Daniels MRN: [id number] DOB: [birth year] Today's Date: 05/13/2021  History of Present Illness  Pt is a 75 y.o. female with medical history significant for insulin-dependent diabetes mellitus, history of CVA, diabetic retinopathy, hypertension and hypothyroidism who was brought into the ER by her husband for evaluation following a fall the night prior to her admission. Pt found to have L olecranon process fx (NWB LUE), R 7th rib fx, and unknown age T11 compression fx. Pt s/p T10 kyphoplasty and LUE olecranon ORIF.   Clinical Impression  Pt was pleasant and motivated to participate during the session. Pt required physical assistance with all functional tasks as well as occasional cues to ensure LUE WB compliance. Pt was able to take several steps with a HW with occasional min A to manage the [facility] and for stability.  Pt will benefit from PT services in a SNF setting upon discharge to safely address deficits listed in patient problem list for decreased caregiver assistance and eventual return to PLOF.         Recommendations for follow up therapy are one component of a multi-disciplinary discharge planning process, led by the attending physician.  Recommendations may be updated based on patient status, additional functional criteria and insurance authorization.  Follow Up Recommendations SNF    Equipment Recommendations  Other (comment) (TBD at next venue of care)    Recommendations for Other Services       Precautions / Restrictions Precautions Precautions: Fall Required Braces or Orthoses: Other Brace Other Brace: long arm splint to LUE Restrictions Weight Bearing Restrictions: Yes LUE Weight Bearing: Non weight bearing      Mobility  Bed Mobility Overal bed mobility: Needs Assistance Bed Mobility: Supine to Sit     Supine to sit: Min assist     General bed mobility comments: Min A for BLE and trunk control with cues  for LUE WB compliance; log roll training provided    Transfers Overall transfer level: Needs assistance Equipment used: Hemi-walker Transfers: Sit to/from Stand Sit to Stand: Min assist;Mod assist         General transfer comment: Min to mod A to come to full upright standing with cues for general sequencing  Ambulation/Gait Ambulation/Gait assistance: Min assist Gait Distance (Feet): 3 Feet Assistive device: Hemi-walker Gait Pattern/deviations: Step-through pattern;Decreased step length - right;Decreased step length - left Gait velocity: decreased   General Gait Details: Pt able to take several small steps from bed to chair with Min A to guide the HW and with mod verbal and tactile cues for sequencing; min A on occasion for stability   Stairs            Wheelchair Mobility    Modified Rankin (Stroke Patients Only)       Balance Overall balance assessment: Needs assistance;History of Falls Sitting-balance support: No upper extremity supported;Feet unsupported Sitting balance-Leahy Scale: Fair     Standing balance support: Single extremity supported;During functional activity Standing balance-Leahy Scale: Poor Standing balance comment: Min A for stability in standing                             Pertinent Vitals/Pain Pain Assessment: 0-10 Pain Score: 6  Pain Location: LUE Pain Descriptors / Indicators: Aching;Sore Pain Intervention(s): Premedicated before session;Repositioned;Monitored during session    [place] expects to be discharged to:: Private residence Living Arrangements: Spouse/significant other Available Help at Discharge: Family;Available 24 hours/day  Type of Home: House Home Access: Stairs to enter Entrance Stairs-Rails: Left Entrance Stairs-Number of Steps: 3 Home Layout: One level Home Equipment: Walker - 2 wheels Additional Comments: spouse present to assist with history    Prior Function Level of  Independence: Needs assistance   Gait / Transfers Assistance Needed: SBA with ambulation with cues needed to remember to use her RW, no other fall history other than current fall  ADL's / Homemaking Assistance Needed: Ind with bathing and dressing        Hand Dominance        Extremity/Trunk Assessment   Upper Extremity Assessment Upper Extremity Assessment: Generalized weakness;LUE deficits/detail LUE: Unable to fully assess due to pain;Unable to fully assess due to immobilization    Lower Extremity Assessment Lower Extremity Assessment: Generalized weakness       Communication      Cognition Arousal/Alertness: Awake/alert Behavior During Therapy: WFL for tasks assessed/performed Overall Cognitive Status: History of cognitive impairments - at baseline                                        General Comments      Exercises Total Joint Exercises Ankle Circles/Pumps: AROM;Strengthening;Both;10 reps Quad Sets: Strengthening;Both;10 reps Gluteal Sets: Strengthening;Both;10 reps Hip ABduction/ADduction: AAROM;Strengthening;Both;5 reps Straight Leg Raises: 5 reps;Both;Strengthening;AAROM Long Arc Quad: AROM;Strengthening;Both;10 reps Knee Flexion: AROM;Strengthening;Both;10 reps  Log roll training with cues for sequencing for decreased caregiver assist and pain with bed mobility    Assessment/Plan    PT Assessment Patient needs continued PT services  PT Problem List Decreased strength;Decreased balance;Decreased mobility;Decreased activity tolerance;Decreased safety awareness;Pain       PT Treatment Interventions Therapeutic activities;Gait training;Balance training;Functional mobility training;DME instruction;Stair training;Therapeutic exercise;Patient/family education    PT Goals (Current goals can be found in the Care Plan section)  Acute Rehab PT Goals Patient Stated Goal: To walk better PT Goal Formulation: With patient Time For Goal  Achievement: 05/26/21 Potential to Achieve Goals: Fair    Frequency BID   Barriers to discharge Inaccessible home environment;Decreased caregiver support      Co-evaluation               AM-PAC PT "6 Clicks" Mobility  Outcome Measure Help needed turning from your back to your side while in a flat bed without using bedrails?: A Little Help needed moving from lying on your back to sitting on the side of a flat bed without using bedrails?: A Little Help needed moving to and from a bed to a chair (including a wheelchair)?: A Lot Help needed standing up from a chair using your arms (e.g., wheelchair or bedside chair)?: A Lot Help needed to walk in hospital room?: A Lot Help needed climbing 3-5 steps with a railing? : Total 6 Click Score: 13    End of Session Equipment Utilized During Treatment: Gait belt Activity Tolerance: Patient tolerated treatment well Patient left: with bed alarm set;with call bell/phone within reach;in chair Nurse Communication: Mobility status;Weight bearing status PT Visit Diagnosis: Unsteadiness on feet (R26.81);History of falling (Z91.81);Muscle weakness (generalized) (M62.81);Difficulty in walking, not elsewhere classified (R26.2);Pain Pain - Right/Left: Left Pain - part of body: Arm    Time: 8527-7824 PT Time Calculation (min) (ACUTE ONLY): 41 min   Charges:   PT Evaluation $PT Re-evaluation: 1 Re-eval PT Treatments $Therapeutic Exercise: 8-22 mins $Therapeutic Activity: 8-22 mins       D. Scott  Tzirel Leonor PT, DPT 05/13/21, 1:57 PM

## 2021-05-13 NOTE — Evaluation (Signed)
Occupational Therapy Re-Evaluation Patient Details Name: Erin Daniels MRN: [id number] DOB: 07/15/1946 Today's Date: 05/13/2021   History of Present Illness Pt is a 75 y.o. female with medical history significant for insulin-dependent diabetes mellitus, history of CVA, diabetic retinopathy, hypertension and hypothyroidism who was brought into the ER by her husband for evaluation following a fall the night prior to her admission. Pt found to have L olecranon process fx (NWB LUE), R 7th rib fx, and unknown age T11 compression fx. Pt s/p T10 kyphoplasty and LUE olecranon ORIF on 9/22.   Clinical Impression   Pt seen for Re-Evaluation in setting of prolonged hospitalization now s/p L olecranon ORIF and T10 kypho. Pt is pleasant and agreeable to session. She is able to follow commands/cues well, but does have some confusion r/t time and situation. Pt requires MIN A to come to sitting and MIN A for ADL transfers with at least R UE support and MIN A to sustain static standing. Pt is generally weak and her ADLs are impacted by immobilization of L UE. Pt requires MIN A for seated UB ADLs and MOD A for seated LB ADLs. Pt left seated EOB at end of session as PT presents to room for treatment. OT will continue to follow acutely. Continue to anticipate that pt will require STR upon d/c from acute setting.      Recommendations for follow up therapy are one component of a multi-disciplinary discharge planning process, led by the attending physician.  Recommendations may be updated based on patient status, additional functional criteria and insurance authorization.   Follow Up Recommendations  SNF    Equipment Recommendations  Other (comment) (defer to next venue)    Recommendations for Other Services       Precautions / Restrictions Precautions Precautions: Fall Required Braces or Orthoses: Other Brace Other Brace: long arm splint to LUE Restrictions Weight Bearing Restrictions: Yes LUE Weight  Bearing: Non weight bearing      Mobility Bed Mobility Overal bed mobility: Needs Assistance Bed Mobility: Sit to Supine     Supine to sit: Min assist Sit to supine: Min assist   General bed mobility comments: Min A for BLE and trunk control during transition to supine.    Transfers Overall transfer level: Needs assistance Equipment used: Hemi-walker Transfers: Sit to/from Stand Sit to Stand: Min assist         General transfer comment: cues to sequence    Balance Overall balance assessment: Needs assistance;History of Falls Sitting-balance support: No upper extremity supported;Feet unsupported Sitting balance-Leahy Scale: Fair Sitting balance - Comments: F static sitting Postural control: Posterior lean;Right lateral lean Standing balance support: Single extremity supported;During functional activity Standing balance-Leahy Scale: Poor Standing balance comment: MIN A to sustain                           ADL either performed or assessed with clinical judgement   ADL Overall ADL's : Needs assistance/impaired                                       General ADL Comments: requires MIN A for seated UB ADLs, MOD A for seated LB ADLs, MIN A for bed mobility and MIN A for transfers.     Vision Baseline Vision/History: 5 Retinopathy;1 Wears glasses Ability to See in Adequate Light: 1 Impaired Patient Visual Report: Blurring of  vision Additional Comments: some vision issues at baseline, difficult to formally assess d/t cognition/processing of higher level tasks/commands.     Perception     Praxis      Pertinent Vitals/Pain Pain Assessment: Faces Faces Pain Scale: Hurts even more Pain Location: LUE Pain Descriptors / Indicators: Aching;Sore Pain Intervention(s): Limited activity within patient's tolerance;Monitored during session;Repositioned     Hand Dominance Right   Extremity/Trunk Assessment Upper Extremity Assessment Upper  Extremity Assessment: Generalized weakness;LUE deficits/detail RUE Deficits / Details: Grossly at least 3/5 strength in all movements RUE Sensation: WNL RUE Coordination: WNL LUE Deficits / Details: able to wiggle fingers, noted to have swelling to L hand. L elbow is immobilized by long arm splint LUE: Unable to fully assess due to pain;Unable to fully assess due to immobilization LUE Coordination: decreased gross motor   Lower Extremity Assessment Lower Extremity Assessment: Generalized weakness       Communication Communication Communication: Other (comment) (confirm with family as able, pt poor historian)   Cognition Arousal/Alertness: Awake/alert Behavior During Therapy: WFL for tasks assessed/performed Overall Cognitive Status: History of cognitive impairments - at baseline Area of Impairment: Problem solving                             Problem Solving: Decreased initiation;Requires verbal cues;Requires tactile cues General Comments: Pt able to follow one step commands and is pleasant and appropriate throughout session. Decreased cognitive awareness of situation and temporal concepts.   General Comments       Exercises Total Joint Exercises Ankle Circles/Pumps: AROM;Strengthening;Both;10 reps Quad Sets: AROM;Strengthening;Both;10 reps Gluteal Sets: AROM;Strengthening;Both;10 reps Other Exercises Other Exercises: OT ed re: role of OT in acute setting, MIn/MOD reception   Shoulder Instructions      Home Living Family/patient expects to be discharged to:: Private residence Living Arrangements: Spouse/significant other Available Help at Discharge: Family;Available 24 hours/day Type of Home: House Home Access: Stairs to enter [employer] of Steps: 3 Entrance Stairs-Rails: Left Home Layout: One level     Bathroom Shower/Tub: Occupational psychologist: Standard Bathroom Accessibility: Yes   Home Equipment: Environmental consultant - 2 wheels    Additional Comments: spouse present to assist with history      Prior Functioning/Environment Level of Independence: Needs assistance  Gait / Transfers Assistance Needed: SBA with ambulation with cues needed to remember to use her RW, no other fall history other than current fall ADL's / Homemaking Assistance Needed: Ind with bathing and dressing   Comments: Pt reporting independence with dressing & spongebathing. Pt with confusion and unable to elaborate further on PLOF. Plan to confirm with family as able        OT Problem List: Decreased strength;Decreased activity tolerance;Impaired balance (sitting and/or standing);Impaired vision/perception;Decreased cognition;Impaired UE functional use;Pain      OT Treatment/Interventions: Self-care/ADL training;Therapeutic exercise;DME and/or AE instruction;Therapeutic activities;Patient/family education;Balance training    OT Goals(Current goals can be found in the care plan section) Acute Rehab OT Goals Patient Stated Goal: To walk better OT Goal Formulation: Patient unable to participate in goal setting Time For Goal Achievement: 05/27/21 Potential to Achieve Goals: Good ADL Goals Pt Will Perform Upper Body Dressing: with min guard assist;sitting (with modified technique) Pt Will Transfer to Toilet: with min guard assist;ambulating;bedside commode (with hemi walker to BSC ~10' away to increase tolerance for fxl HH distances.) Pt Will Perform Toileting - Clothing Manipulation and hygiene: with min guard assist;sit to/from stand  OT Frequency:  Min 1X/week   Barriers to D/C:            Co-evaluation              AM-PAC OT "6 Clicks" Daily Activity     Outcome Measure Help from another person eating meals?: A Little Help from another person taking care of personal grooming?: A Little Help from another person toileting, which includes using toliet, bedpan, or urinal?: A Lot Help from another person bathing (including washing,  rinsing, drying)?: A Lot Help from another person to put on and taking off regular upper body clothing?: A Little Help from another person to put on and taking off regular lower body clothing?: A Lot 6 Click Score: 15   End of Session Equipment Utilized During Treatment: Gait belt;Other (comment) (hemi walker)  Activity Tolerance: Patient tolerated treatment well Patient left: Other (comment) (seated EOB with PT presenting for second session)  OT Visit Diagnosis: Unsteadiness on feet (R26.81);Muscle weakness (generalized) (M62.81);History of falling (Z91.81);Pain;Other symptoms and signs involving cognitive function Pain - Right/Left: Right                Time: 6837-2902 OT Time Calculation (min): 25 min Charges:  OT General Charges $OT Visit: 1 Visit OT Evaluation $OT Re-eval: 1 Re-eval OT Treatments $Self Care/Home Management : 8-22 mins  Gerrianne Scale, MS, OTR/L ascom [phone] 05/13/21, 5:48 PM

## 2021-05-13 NOTE — Progress Notes (Signed)
Inpatient Diabetes Program Recommendations  AACE/ADA: New Consensus Statement on Inpatient Glycemic Control (2015)  Target Ranges:  Prepandial:   less than 140 mg/dL      Peak postprandial:   less than 180 mg/dL (1-2 hours)      Critically ill patients:  140 - 180 mg/dL  Results for Erin Daniels, Erin Daniels (MRN [id number]) as of 05/13/2021 09:28  Ref. Range 05/11/2021 09:19 05/11/2021 12:16 05/11/2021 16:28 05/11/2021 20:40  Glucose-Capillary Latest Ref Range: 70 - 99 mg/dL 250 (H)  7 units Novolog  8 units Semglee  214 (H)  6 units Novolog  109 (H)  4 units Novolog  150 (H)  Results for Erin Daniels, Erin Daniels (MRN [id number]) as of 05/13/2021 09:28  Ref. Range 05/12/2021 07:39 05/12/2021 11:58 05/12/2021 14:58 05/12/2021 20:35 05/12/2021 20:59  Glucose-Capillary Latest Ref Range: 70 - 99 mg/dL 299 (H)  2 units Novolog  224 (H)  2 units Novolog  9 units Semglee 122 (H) 64 (L) 79  Results for Erin Daniels, Erin Daniels (MRN [id number]) as of 05/13/2021 09:28  Ref. Range 05/13/2021 08:52  Glucose-Capillary Latest Ref Range: 70 - 99 mg/dL 321 (H)  8 units Novolog  9 units Semglee    History: Type 1 diabetes (makes NO insulin; requires basal, correction, and carb coverage insulin)  Home DM Meds: Omni Pod insulin pump with Novolog and Dexcom CGM  Current Orders: Semglee 9 units Daily      Novolog 0-6 units TID AC + HS      Novolog 4 units TID with meals     MD- Unsure why patient's AM CBGs have been staying elevated the last 3 days?  Only gets about 7.5 units basal insulin on her pump per 24 hour period at home.  Please consider increasing the Semglee slightly to 11 units Daily  Since 9 unit dose already given this AM, please also order Semglee 2 units X1 to be given this AM as well    --Will follow patient during hospitalization--  Wyn Quaker RN, MSN, CDE Diabetes Coordinator Inpatient Glycemic Control Team Team Pager: [phone] (8a-5p)

## 2021-05-13 NOTE — Progress Notes (Signed)
Subjective: 1 Day Post-Op Procedure(s) (LRB): OPEN REDUCTION INTERNAL FIXATION (ORIF) ELBOW/OLECRANON FRACTURE (Left) KYPHOPLASTY - T11 (N/A) Patient states back pain has improved since T11 kyphoplasty yesterday on 05/12/2021.  She also underwent olecranon ORIF, complaining of pain and swelling throughout the left elbow forearm wrist and digits.  No numbness or tingling.  She is able to move the digits in the left upper extremity.   Objective: Vital signs in last 24 hours: Temp:  [97.2 F (36.2 C)-99.3 F (37.4 C)] 98.3 F (36.8 C) (09/23 0850) Pulse Rate:  [73-86] 85 (09/23 0850) Resp:  [13-24] 14 (09/23 0850) BP: (93-146)/(47-68) 146/59 (09/23 0850) SpO2:  [88 %-99 %] 94 % (09/23 0850)  Intake/Output from previous day: 09/22 0701 - 09/23 0700 In: 1271 [P.O.:118; I.V.:1003; IV Piggyback:150] Out: 700 [Urine:700] Intake/Output this shift: No intake/output data recorded.  No results for input(s): HGB in the last 72 hours. No results for input(s): WBC, RBC, HCT, PLT in the last 72 hours. Recent Labs    05/12/21 0456 05/13/21 0420  NA 124* 128*  K 4.3 4.7  CL 90* 96*  CO2 24 24  BUN 17 14  CREATININE 0.56 0.59  GLUCOSE 271* 243*  CALCIUM 8.3* 7.6*   No results for input(s): LABPT, INR in the last 72 hours.  EXAM General - Patient is Alert, Appropriate, and Oriented Left upper extremity - Neurovascular intact Sensation intact distally Intact pulses distally Posterior splint intact, no drainage. Thoracolumbar spine -no spinous process tenderness.  Mild paravertebral muscle tenderness.  Incision site intact Motor Function - intact, moving foot and toes well on exam.   Past Medical History:  Diagnosis Date   Angina pectoris (HCC)    At high risk for falls    Cancer [facility])    Carpal tunnel syndrome    BILATERAL   Cervicalgia    Complication of anesthesia    difficult to arouse   Diabetes mellitus without complication (HCC)    Type I   Diabetic neuropathy (HCC)     Diabetic neuropathy (HCC)    Diabetic retinopathy ([place])    Diabetic retinopathy ([place])    Dizziness    Gastroparesis    Headache    MIGRAINE   Herniated disc, cervical    Hypercholesteremia    Hypertension    Hypokalemia    Hypotensive episode    Hypothyroidism    Insulin pump in place    Myofascial muscle pain    Numbness of arm    left   Osteoporosis    Poor vision    Rosacea    Seizures (HCC)    Slurred speech    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   Stroke ([place])    x5   Vertigo    Wears glasses     Assessment/Plan:   1 Day Post-Op Procedure(s) (LRB): OPEN REDUCTION INTERNAL FIXATION (ORIF) ELBOW/OLECRANON FRACTURE (Left) KYPHOPLASTY - T11 (N/A) Principal Problem:   Fall Active Problems:   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   Weight as of this encounter: 53.5 kg. Advance diet Up with therapy Patient can perform  activity as tolerated.  Nonweightbearing left upper extremity.  Follow-up with [facility] orthopedics in 1 week for dressing change/incision check.  Keep splint clean and dry at all times.  Continue to keep left upper extremity elevated and apply ice to the area.  DVT Prophylaxis -  Plavix    T. Rachelle Hora, PA-C [place] 05/13/2021, 12:34 PM

## 2021-05-13 NOTE — Plan of Care (Signed)
  Problem: Education: Goal: Knowledge of General Education information will improve Description: Including pain rating scale, medication(s)/side effects and non-pharmacologic comfort measures Outcome: Progressing   Problem: Health Behavior/Discharge Planning: Goal: Ability to manage health-related needs will improve Outcome: Progressing   Problem: Clinical Measurements: Goal: Ability to maintain clinical measurements within normal limits will improve Outcome: Progressing Goal: Will remain free from infection Outcome: Progressing Goal: Diagnostic test results will improve Outcome: Progressing Goal: Respiratory complications will improve Outcome: Progressing Goal: Cardiovascular complication will be avoided Outcome: Progressing   Problem: Activity: Goal: Risk for activity intolerance will decrease Outcome: Progressing   Problem: Nutrition: Goal: Adequate nutrition will be maintained Outcome: Progressing   Problem: Coping: Goal: Level of anxiety will decrease Outcome: Progressing   Problem: Elimination: Goal: Will not experience complications related to bowel motility Outcome: Progressing Goal: Will not experience complications related to urinary retention Outcome: Progressing   Problem: Pain Managment: Goal: General experience of comfort will improve Outcome: Progressing   Problem: Safety: Goal: Ability to remain free from injury will improve Outcome: Progressing   Problem: Skin Integrity: Goal: Risk for impaired skin integrity will decrease Outcome: Progressing   Problem: Education: Goal: Ability to describe self-care measures that may prevent or decrease complications (Diabetes Survival Skills Education) will improve Outcome: Progressing Goal: Individualized Educational Video(s) Outcome: Progressing   Problem: Coping: Goal: Ability to adjust to condition or change in health will improve Outcome: Progressing   Problem: Fluid Volume: Goal: Ability to  maintain a balanced intake and output will improve Outcome: Progressing   Problem: Health Behavior/Discharge Planning: Goal: Ability to identify and utilize available resources and services will improve Outcome: Progressing Goal: Ability to manage health-related needs will improve Outcome: Progressing   Problem: Metabolic: Goal: Ability to maintain appropriate glucose levels will improve Outcome: Progressing   Problem: Nutritional: Goal: Maintenance of adequate nutrition will improve Outcome: Progressing Goal: Progress toward achieving an optimal weight will improve Outcome: Progressing   Problem: Skin Integrity: Goal: Risk for impaired skin integrity will decrease Outcome: Progressing   Problem: Tissue Perfusion: Goal: Adequacy of tissue perfusion will improve Outcome: Progressing   Problem: Urinary Elimination: Goal: Signs and symptoms of infection will decrease Outcome: Progressing   

## 2021-05-13 NOTE — Progress Notes (Signed)
PT Cancellation Note  Patient Details Name: Erin Daniels MRN: [id number] DOB: 05-03-1946   Cancelled Treatment:    Reason Eval/Treat Not Completed: Other (comment).  Pt chart reviewed and attempted to see.  Pt undergoing pt care with nursing staff at this time.  Therapist will re-attempt to see pt at later date/time as medically appropriate.     Gwenlyn Saran, PT, DPT 05/13/21, 3:45 PM

## 2021-05-13 NOTE — Progress Notes (Signed)
Patient returned from surgery in stable condition. AOx4. Reports 7/10 left arm pain. Back dressing  C/D/I. Foley intact. Plan of care reviewed with patient. Call bell within reach.  Blood sugar 64. Hypoglycemic protocol initiated.

## 2021-05-13 NOTE — Progress Notes (Signed)
Physical Therapy Treatment Patient Details Name: Erin Daniels MRN: [id number] DOB: 24-Nov-1945 Today's Date: 05/13/2021   History of Present Illness Pt is a 75 y.o. female with medical history significant for insulin-dependent diabetes mellitus, history of CVA, diabetic retinopathy, hypertension and hypothyroidism who was brought into the ER by her husband for evaluation following a fall the night prior to her admission. Pt found to have L olecranon process fx (NWB LUE), R 7th rib fx, and unknown age T11 compression fx. Pt s/p T10 kyphoplasty and LUE olecranon ORIF.    PT Comments    Pt upright seated at EOB with OT upon arrival.  Pt eager to attempt to ambulate within room.  Pt able to focus and perform mobility with much better sequencing and strength compared to evaluation.  Pt continues to take smaller steps, however is making good progress with the sequencing of using a hemiwalker.  Pt able to ambulate to the door and back with CGA.  Pt left in bed with performance of bed-level exercises and educated on exercise regimen during hospital stay. Current discharge plans to SNF remain appropriate at this time.  Pt will continue to benefit from skilled therapy in order to address deficits listed below.     Recommendations for follow up therapy are one component of a multi-disciplinary discharge planning process, led by the attending physician.  Recommendations may be updated based on patient status, additional functional criteria and insurance authorization.  Follow Up Recommendations  SNF     Equipment Recommendations  Other (comment) (TBD at next venue of care)    Recommendations for Other Services       Precautions / Restrictions Precautions Precautions: Fall Required Braces or Orthoses: Other Brace Other Brace: long arm splint to LUE Restrictions Weight Bearing Restrictions: Yes LUE Weight Bearing: Non weight bearing     Mobility  Bed Mobility Overal bed mobility: Needs  Assistance Bed Mobility: Sit to Supine     Supine to sit: Min assist Sit to supine: Min assist   General bed mobility comments: Min A for BLE and trunk control during transition to supine.    Transfers Overall transfer level: Needs assistance Equipment used: Hemi-walker Transfers: Sit to/from Stand Sit to Stand: Min assist;Min guard         General transfer comment: Min to CGA to come to full upright standing with cues for general sequencing  Ambulation/Gait Ambulation/Gait assistance: Min guard Gait Distance (Feet): 10 Feet Assistive device: Hemi-walker Gait Pattern/deviations: Step-through pattern;Decreased step length - right;Decreased step length - left Gait velocity: decreased   General Gait Details: Pt able to take several small steps from bed to door with CGA and HW and with mod verbal and tactile cues for sequencing   Stairs             Wheelchair Mobility    Modified Rankin (Stroke Patients Only)       Balance Overall balance assessment: Needs assistance;History of Falls Sitting-balance support: No upper extremity supported;Feet unsupported Sitting balance-Leahy Scale: Fair     Standing balance support: Single extremity supported;During functional activity Standing balance-Leahy Scale: Poor Standing balance comment: Min A for stability in standing                            Cognition Arousal/Alertness: Awake/alert Behavior During Therapy: WFL for tasks assessed/performed Overall Cognitive Status: History of cognitive impairments - at baseline Area of Impairment: Problem solving  General Comments: Pt able to follow one step directions during transition to standing and ambulation today.      Exercises Total Joint Exercises Ankle Circles/Pumps: AROM;Strengthening;Both;10 reps Quad Sets: AROM;Strengthening;Both;10 reps Gluteal Sets: AROM;Strengthening;Both;10 reps Hip ABduction/ADduction:  AAROM;Strengthening;Both;5 reps Straight Leg Raises: 5 reps;Both;Strengthening;AAROM Long Arc Quad: AROM;Strengthening;Both;10 reps Knee Flexion: AROM;Strengthening;Both;10 reps    General Comments        Pertinent Vitals/Pain Pain Assessment: Faces Pain Score: 6  Faces Pain Scale: Hurts even more Pain Location: LUE Pain Descriptors / Indicators: Aching;Sore Pain Intervention(s): Limited activity within patient's tolerance;Monitored during session;Repositioned    Home Living Family/patient expects to be discharged to:: Private residence Living Arrangements: Spouse/significant other Available Help at Discharge: Family;Available 24 hours/day Type of Home: House Home Access: Stairs to enter Entrance Stairs-Rails: Left Home Layout: One level Home Equipment: Environmental consultant - 2 wheels Additional Comments: spouse present to assist with history    Prior Function Level of Independence: Needs assistance  Gait / Transfers Assistance Needed: SBA with ambulation with cues needed to remember to use her RW, no other fall history other than current fall ADL's / Homemaking Assistance Needed: Ind with bathing and dressing     PT Goals (current goals can now be found in the care plan section) Acute Rehab PT Goals Patient Stated Goal: To walk better PT Goal Formulation: With patient Time For Goal Achievement: 05/26/21 Potential to Achieve Goals: Fair Progress towards PT goals: Progressing toward goals    Frequency    BID      PT Plan Current plan remains appropriate    Co-evaluation              AM-PAC PT "6 Clicks" Mobility   Outcome Measure  Help needed turning from your back to your side while in a flat bed without using bedrails?: A Little Help needed moving from lying on your back to sitting on the side of a flat bed without using bedrails?: A Little Help needed moving to and from a bed to a chair (including a wheelchair)?: A Little Help needed standing up from a chair using  your arms (e.g., wheelchair or bedside chair)?: A Little Help needed to walk in hospital room?: A Lot Help needed climbing 3-5 steps with a railing? : Total 6 Click Score: 15    End of Session Equipment Utilized During Treatment: Gait belt Activity Tolerance: Patient tolerated treatment well Patient left: in bed;with call bell/phone within reach;with bed alarm set Nurse Communication: Mobility status;Weight bearing status PT Visit Diagnosis: Unsteadiness on feet (R26.81);History of falling (Z91.81);Muscle weakness (generalized) (M62.81);Difficulty in walking, not elsewhere classified (R26.2);Pain Pain - Right/Left: Left Pain - part of body: Arm     Time: 8309-4076 PT Time Calculation (min) (ACUTE ONLY): 16 min  Charges:  $Gait Training: 8-22 mins $Therapeutic Exercise: 8-22 mins $Therapeutic Activity: 8-22 mins                     Gwenlyn Saran, PT, DPT 05/13/21, 4:56 PM    Christie Nottingham 05/13/2021, 4:54 PM

## 2021-05-13 NOTE — Progress Notes (Signed)
PROGRESS NOTE    Erin Daniels  LFY:[id number] DOB: [birth year] DOA: 05/07/2021 PCP: Juluis Pitch, MD    Brief Narrative:  75 y.o. female with medical history significant for insulin-dependent diabetes mellitus, history of CVA, diabetic retinopathy, hypertension and hypothyroidism who was brought into the ER by her husband for evaluation following a fall the night prior to her admission. Patient was started on antibiotic therapy 2 days prior to this hospitalization for UTI and was also placed on tramadol for pain control.  At baseline she usually ambulates with a rolling walker and according to the husband she had gone to use the bathroom and on her way out forgot to take her walker and fell landing on her left elbow.  He was able to assist her up but noticed significant swelling involving the left elbow and then patient started complaining of pain in the right lateral chest wall.  She rated her pain a 6 to 7 x 10 in intensity at its worst and left elbow pain is worse with any form of movement activity. She complains of urinary frequency.  Orthopedics on consult with plan for ORIF of left olecranon and kyphoplasty at T11.  Patient successfully underwent left upper extremity ORIF and T11 kyphoplasty on 9/22.  Tolerated procedure well with no immediate complications.  Postoperative day 1 her main complaint is left elbow pain.  She endorses pain in her back is improved.  Has not ambulated yet.   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 ([place])   Essential hypertension   Depression   Stroke [facility])   Hypothyroidism   Olecranon fracture, left, closed, initial encounter   Fracture of one rib, right side, initial encounter for closed fracture   Olecranon fracture  Fracture of left olecranon process Ortho following ORIF done 9/22 Plan: As needed pain control Can likely restart Plavix within 12  to 24 hours  Delirium 2/2 pain, narcotics - delirium precautions - minimize opioids to the extent we can - gabapentin dose decreased -Added Toradol for pain control   Right axillary lymphadenopathy Read says concerning for malignancy. 11/2020 mammo birads 1 - will need outpt f/u   Urinary retention Persistent PVRs >300, has required I/O cath multiple times -Foley was discontinued 9/23 -Voiding trial -Ambulate   Right 7th rib fracture T10 compression fracture Ortho following T11 kyphoplasty done 9/22 Plavix on hold currently.  Can likely restart today or tomorrow    T1DM On insulin pump at home. Glucose mildly elevated this am. Pump has been removed - dm educator consulted -Sliding scale, NovoLog, Lantus 11 units daily   Bacteriuria Abnormal ua on 9/15 at pcp's office. Pt denies uti symptoms. Given ceftriaxone here, now discontinued. Urine culture negative   HTN Here bp elevated -Continue losartan 100 daily -Ensure pain control   Chronic pain - home gabapentin (decreased evening dose due to sedation) and effexor    Hx CVA - cont home statin - hold asa/plavix in preparation for surgery   DVT prophylaxis: SQ Lovenox Code Status: Full Family Communication:   spouse [name] [phone] on 9/22 Disposition Plan: Status is: Inpatient  Remains inpatient appropriate because:Inpatient level of care appropriate due to severity of illness  Dispo: The patient is from: Home              Anticipated d/c is to: SNF  Patient currently is not medically stable to d/c.   Difficult to place patient No  Postoperative day #1.  Therapy evaluations, pain control.  Will need to restart antiplatelet agents soon.  Likely within 24 hours.     Level of care: Med-Surg  Consultants:  Orthopedics  Procedures:  ORIF left olecranon 9/22 T11 kyphoplasty 9/22  Antimicrobials:  None   Subjective: Patient seen and examined.  Resting in bed.  Endorses pain in left  elbow.  Endorses improved pain and lower back  Objective: Vitals:   05/13/21 0006 05/13/21 0429 05/13/21 0618 05/13/21 0850  BP: 128/61 138/63  (!) 146/59  Pulse: 79 86  85  Resp: 20 18  14   Temp: 99.3 F (37.4 C) 98.3 F (36.8 C)  98.3 F (36.8 C)  TempSrc:  Oral    SpO2: 98% 98% 97% 94%  Weight:      Height:        Intake/Output Summary (Last 24 hours) at 05/13/2021 1114 Last data filed at 05/13/2021 0600 Gross per 24 hour  Intake 1271 ml  Output 350 ml  Net 921 ml   Filed Weights   05/07/21 2217  Weight: 53.5 kg    Examination:  General exam: No apparent distress. Respiratory system: Clear.  Normal work of breathing.  Room air Cardiovascular system: S1-S2, RRR, no murmurs, no pedal edema Gastrointestinal system: Soft, nontender, nondistended, normal bowel sounds Central nervous system: Alert and oriented. No focal neurological deficits. Extremities: Decreased power bilateral lower extremity.  Improved ROM.  Left elbow in surgical wraps Skin: No rashes, lesions or ulcers Psychiatry: Judgement and insight appear normal. Mood & affect appropriate.     Data Reviewed: I have personally reviewed following labs and imaging studies  CBC: Recent Labs  Lab 05/07/21 1523 05/08/21 0605 05/09/21 0620  WBC 8.4 9.0 12.3*  NEUTROABS 6.4  --   --   HGB 10.6* 10.0* 9.6*  HCT 31.3* 29.7* 27.6*  MCV 94.6 92.8 93.2  PLT 248 234 056   Basic Metabolic Panel: Recent Labs  Lab 05/09/21 0620 05/10/21 0406 05/11/21 0334 05/12/21 0456 05/13/21 0420  NA 132* 131* 128* 124* 128*  K 4.1 3.9 4.2 4.3 4.7  CL 100 97* 94* 90* 96*  CO2 25 26 24 24 24   GLUCOSE 191* 166* 291* 271* 243*  BUN 18 15 13 17 14   CREATININE 0.51 0.52 0.59 0.56 0.59  CALCIUM 8.6* 8.5* 8.1* 8.3* 7.6*   GFR: Estimated Creatinine Clearance: 49.7 mL/min (by C-G formula based on SCr of 0.59 mg/dL). Liver Function Tests: Recent Labs  Lab 05/07/21 1523  AST 25  ALT 18  ALKPHOS 78  BILITOT 0.4  PROT  6.9  ALBUMIN 3.6   No results for input(s): LIPASE, AMYLASE in the last 168 hours. No results for input(s): AMMONIA in the last 168 hours. Coagulation Profile: No results for input(s): INR, PROTIME in the last 168 hours. Cardiac Enzymes: Recent Labs  Lab 05/07/21 2201  CKTOTAL 188   BNP (last 3 results) No results for input(s): PROBNP in the last 8760 hours. HbA1C: No results for input(s): HGBA1C in the last 72 hours. CBG: Recent Labs  Lab 05/12/21 1458 05/12/21 2035 05/12/21 2059 05/13/21 0143 05/13/21 0852  GLUCAP 122* 64* 79 242* 321*   Lipid Profile: No results for input(s): CHOL, HDL, LDLCALC, TRIG, CHOLHDL, LDLDIRECT in the last 72 hours. Thyroid Function Tests: No results for input(s): TSH, T4TOTAL, FREET4, T3FREE, THYROIDAB in the last 72 hours. Anemia Panel: No results  for input(s): VITAMINB12, FOLATE, FERRITIN, TIBC, IRON, RETICCTPCT in the last 72 hours. Sepsis Labs: No results for input(s): PROCALCITON, LATICACIDVEN in the last 168 hours.  Recent Results (from the past 240 hour(s))  Resp Panel by RT-PCR (Flu A&B, Covid) Nasopharyngeal Swab     Status: None   Collection Time: 05/07/21  3:53 PM   Specimen: Nasopharyngeal Swab; Nasopharyngeal(NP) swabs in vial transport medium  Result Value Ref Range Status   SARS Coronavirus 2 by RT PCR NEGATIVE NEGATIVE Final    Comment: (NOTE) SARS-CoV-2 target nucleic acids are NOT DETECTED.  The SARS-CoV-2 RNA is generally detectable in upper respiratory specimens during the acute phase of infection. The lowest concentration of SARS-CoV-2 viral copies this assay can detect is 138 copies/mL. A negative result does not preclude SARS-Cov-2 infection and should not be used as the sole basis for treatment or other patient management decisions. A negative result may occur with  improper specimen collection/handling, submission of specimen other than nasopharyngeal swab, presence of viral mutation(s) within the areas  targeted by this assay, and inadequate number of viral copies(<138 copies/mL). A negative result must be combined with clinical observations, patient history, and epidemiological information. The expected result is Negative.  Fact Sheet for Patients:  EntrepreneurPulse.com.au  Fact Sheet for Healthcare Providers:  IncredibleEmployment.be  This test is no t yet approved or cleared by the [place] FDA and  has been authorized for detection and/or diagnosis of SARS-CoV-2 by FDA under an Emergency Use Authorization (EUA). This EUA will remain  in effect (meaning this test can be used) for the duration of the COVID-19 declaration under Section 564(b)(1) of the Act, 21 U.S.C.section 360bbb-3(b)(1), unless the authorization is terminated  or revoked sooner.       Influenza A by PCR NEGATIVE NEGATIVE Final   Influenza B by PCR NEGATIVE NEGATIVE Final    Comment: (NOTE) The Xpert Xpress SARS-CoV-2/FLU/RSV plus assay is intended as an aid in the diagnosis of influenza from Nasopharyngeal swab specimens and should not be used as a sole basis for treatment. Nasal washings and aspirates are unacceptable for Xpert Xpress SARS-CoV-2/FLU/RSV testing.  Fact Sheet for Patients: EntrepreneurPulse.com.au  Fact Sheet for Healthcare Providers: IncredibleEmployment.be  This test is not yet approved or cleared by the [place] FDA and has been authorized for detection and/or diagnosis of SARS-CoV-2 by FDA under an Emergency Use Authorization (EUA). This EUA will remain in effect (meaning this test can be used) for the duration of the COVID-19 declaration under Section 564(b)(1) of the Act, 21 U.S.C. section 360bbb-3(b)(1), unless the authorization is terminated or revoked.  Performed at [facility], [address], [place], [place] [address]   Urine Culture     Status: None   Collection Time: 05/08/21   4:30 AM   Specimen: Urine, Clean Catch  Result Value Ref Range Status   Specimen Description   Final    URINE, CLEAN CATCH Performed at [facility], [address], [place], [place] [address]    Special Requests   Final    NONE Performed at [facility], [address], [place], [place] [address]    Culture   Final    NO GROWTH Performed at [facility], [place] [address], [place], [place] [address]    Report Status 05/09/2021 FINAL  Final         Radiology Studies: DG Thoracic Spine 2 View  Result Date: 05/12/2021 CLINICAL DATA:  Surgery, elective. Additional history provided: T11  kyphoplasty. Provided fluoroscopy time 1 minute. EXAM: THORACIC SPINE 2 VIEWS; DG C-ARM 1-60 MIN COMPARISON:  MRI of the thoracic and lumbar spine 05/09/2021. FINDINGS: An AP view intraprocedural fluoroscopic cine clip and a lateral view intraprocedural fluoroscopic image of the thoracic spine is submitted. On the provided images, kyphoplasty material is present within a thoracic vertebral body. The thoracic vertebral level can not be ascertained given the provided field of view. Correlate with the procedural history. IMPRESSION: Intraprocedural fluoroscopic images from thoracic vertebral kyphoplasty, as described. Per the provided history, kyphoplasty was performed at the T11 level. However, the level of kyphoplasty is difficult to ascertain on today's provided images given the field of view. An acute to subacute compression fracture of the T10 inferior endplate was noted on the recent prior thoracic spine MRI of 05/09/2021. Correlate with the procedural history. Electronically Signed   By: Kellie Simmering D.O.   On: 05/12/2021 17:44   DG C-Arm 1-60 Min  Result Date: 05/12/2021 CLINICAL DATA:  Surgery, elective. Additional history provided: T11 kyphoplasty. Provided fluoroscopy time 1 minute. EXAM: THORACIC SPINE 2 VIEWS; DG C-ARM 1-60 MIN COMPARISON:  MRI of the thoracic and lumbar spine  05/09/2021. FINDINGS: An AP view intraprocedural fluoroscopic cine clip and a lateral view intraprocedural fluoroscopic image of the thoracic spine is submitted. On the provided images, kyphoplasty material is present within a thoracic vertebral body. The thoracic vertebral level can not be ascertained given the provided field of view. Correlate with the procedural history. IMPRESSION: Intraprocedural fluoroscopic images from thoracic vertebral kyphoplasty, as described. Per the provided history, kyphoplasty was performed at the T11 level. However, the level of kyphoplasty is difficult to ascertain on today's provided images given the field of view. An acute to subacute compression fracture of the T10 inferior endplate was noted on the recent prior thoracic spine MRI of 05/09/2021. Correlate with the procedural history. Electronically Signed   By: Kellie Simmering D.O.   On: 05/12/2021 17:44   DG MINI C-ARM IMAGE ONLY  Result Date: 05/12/2021 There is no interpretation for this exam.  This order is for images obtained during a surgical procedure.  Please See "Surgeries" Tab for more information regarding the procedure.        Scheduled Meds:  acetaminophen  1,000 mg Oral TID   atorvastatin  40 mg Oral Daily   brimonidine  1 drop Right Eye TID   And   timolol  1 drop Right Eye TID   calcium-vitamin D  1 tablet Oral QPM   Chlorhexidine Gluconate Cloth  6 each Topical Daily   cholecalciferol  5,000 Units Oral Daily   clopidogrel  75 mg Oral QPM   docusate sodium  100 mg Oral BID   gabapentin  300 mg Oral QHS   And   gabapentin  300 mg Oral BID   insulin aspart  0-5 Units Subcutaneous QHS   insulin aspart  0-6 Units Subcutaneous TID WC   insulin aspart  4 Units Subcutaneous TID WC   [START ON 05/14/2021] insulin glargine-yfgn  11 Units Subcutaneous Daily   insulin glargine-yfgn  2 Units Subcutaneous Once   ketorolac  15 mg Intravenous Q6H   losartan  100 mg Oral QPM   multivitamin with minerals   1 tablet Oral Daily   polyethylene glycol  17 g Oral Daily   senna  1 tablet Oral Daily   sodium chloride flush  3 mL Intravenous Q12H   venlafaxine  75 mg Oral BID   Continuous Infusions:  LOS: 5 days    Time spent: 25 minutes    Sidney Ace, MD [facility] Pager [phone]  If 7PM-7AM, please contact night-coverage 05/13/2021, 11:14 AM

## 2021-05-14 LAB — GLUCOSE, CAPILLARY
Glucose-Capillary: 183 mg/dL — ABNORMAL HIGH (ref 70–99)
Glucose-Capillary: 212 mg/dL — ABNORMAL HIGH (ref 70–99)
Glucose-Capillary: 165 mg/dL — ABNORMAL HIGH (ref 70–99)
Glucose-Capillary: 92 mg/dL (ref 70–99)
Glucose-Capillary: 255 mg/dL — ABNORMAL HIGH (ref 70–99)

## 2021-05-14 NOTE — NC FL2 (Signed)
[place] LEVEL OF CARE SCREENING TOOL     IDENTIFICATION  Patient Name: Erin Daniels Birthdate: 08-08-1946 Sex: female Admission Date (Current Location): 05/07/2021  [facility] and [place] Number:  Engineering geologist and Address:  [facility], [address] [place], [place] [address]      Provider Number: [id number]  Attending Physician Name and Address:  Sidney Ace, MD  Relative Name and Phone Number:  Abriana Saltos [phone])    Current Level of Care: SNF Recommended Level of Care:   Prior Approval Number:    Date Approved/Denied: 09/16/18 PASRR Number: [id number]  Discharge Plan: SNF    Current Diagnoses: Patient Active Problem List   Diagnosis Date Noted   Migraine headache 05/08/2021   Olecranon fracture 05/08/2021   Fall 05/07/2021   Hypothyroidism    Olecranon fracture, left, closed, initial encounter    Fracture of one rib, right side, initial encounter for closed fracture    Pressure injury of skin 16/96/7893   Acute metabolic encephalopathy 81/08/7508   HLD (hyperlipidemia) 08/11/2019   Stroke ([place]) 08/11/2019   Iron deficiency anemia 08/11/2019   Subarachnoid hematoma ([place]) 04/30/2019   Intertrochanteric fracture of right hip ([place]) 12/08/2018   Hypercoagulopathy ([place]) 09/29/2018   Acute anxiety 09/29/2018   Hypertension associated with type 1 diabetes mellitus ([place]) 09/19/2018   Dyslipidemia due to type 1 diabetes mellitus ([place]) 09/19/2018   Anemia of chronic disease 09/19/2018   Chronic migraine without aura 09/19/2018   Chronic non-seasonal allergic rhinitis 09/19/2018   Major depression, recurrent, chronic ([place]) 09/19/2018   Diabetic gastroparesis associated with type 1 diabetes mellitus ([place]) 09/19/2018   Bilateral sacral insufficiency fracture 09/19/2018   Constipation 09/15/2018   Acute urinary retention 09/15/2018   Intractable pain 09/15/2018   Chronic cerebrovascular accident  (CVA) 05/28/2018   Diabetic polyneuropathy associated with type 1 diabetes mellitus ([place]) 12/10/2017   Numbness and tingling of both legs 12/10/2017   Chronic pain syndrome 12/10/2017   DKA (diabetic ketoacidoses) 11/12/2017   Confusion 11/11/2017   Hypoglycemia 11/26/2016   Lactic acid acidosis    Sepsis (HCC)    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)    Essential hypertension    Depression    Right radial fracture 05/29/2015    Orientation RESPIRATION BLADDER Height & Weight     Self, Time, Situation, Place  Normal Continent Weight: 53.5 kg Height:  5' 3.5" (161.3 cm)  BEHAVIORAL SYMPTOMS/MOOD NEUROLOGICAL BOWEL NUTRITION STATUS      Continent Diet  AMBULATORY STATUS COMMUNICATION OF NEEDS Skin   Limited Assist   Surgical wounds (ORIF left Olecranon; T10 Fracture Traumatic compression fracture.)                       Personal Care Assistance Level of Assistance  Bathing, Dressing Bathing Assistance: Limited assistance   Dressing Assistance: Limited assistance     Functional Limitations Info             SPECIAL CARE FACTORS FREQUENCY  PT (By licensed PT), OT (By licensed OT)     PT Frequency: 5 X weekly OT Frequency: 5 X weekly            Contractures      Additional Factors Info                  Current Medications (05/14/2021):  This is the current hospital active medication list Current Facility-Administered Medications  Medication Dose Route Frequency Provider Last Rate Last Admin   acetaminophen (TYLENOL) tablet 1,000 mg  1,000 mg Oral TID Hessie Knows, MD   1,000 mg at 05/14/21 7846   acetaminophen (TYLENOL) tablet 650 mg  650 mg Oral Q6H PRN Hessie Knows, MD   650 mg at 05/10/21 1352   atorvastatin (LIPITOR) tablet 40 mg  40 mg Oral Daily Hessie Knows, MD   40 mg at 05/13/21 2147   bisacodyl (DULCOLAX) suppository 10 mg  10 mg Rectal Daily PRN Hessie Knows, MD       brimonidine (ALPHAGAN) 0.2 %  ophthalmic solution 1 drop  1 drop Right Eye TID Hessie Knows, MD   1 drop at 05/14/21 9629   And   timolol (TIMOPTIC) 0.5 % ophthalmic solution 1 drop  1 drop Right Eye TID Hessie Knows, MD   1 drop at 05/14/21 5284   butalbital-acetaminophen-caffeine (FIORICET) 50-325-40 MG per tablet 1 tablet  1 tablet Oral BID PRN Hessie Knows, MD       calcium-vitamin D (OSCAL WITH D) 500-200 MG-UNIT per tablet 1 tablet  1 tablet Oral QPM Hessie Knows, MD   1 tablet at 05/13/21 1822   Chlorhexidine Gluconate Cloth 2 % PADS 6 each  6 each Topical Daily Hessie Knows, MD   6 each at 05/14/21 0827   cholecalciferol (VITAMIN D) tablet 5,000 Units  5,000 Units Oral Daily Hessie Knows, MD   5,000 Units at 05/14/21 1324   clopidogrel (PLAVIX) tablet 75 mg  75 mg Oral QPM Hessie Knows, MD   75 mg at 05/13/21 1822   docusate sodium (COLACE) capsule 100 mg  100 mg Oral BID Hessie Knows, MD   100 mg at 05/14/21 4010   gabapentin (NEURONTIN) capsule 300 mg  300 mg Oral QHS Hessie Knows, MD   300 mg at 05/13/21 2147   And   gabapentin (NEURONTIN) capsule 300 mg  300 mg Oral BID Hessie Knows, MD   300 mg at 05/14/21 2725   Glycerin (Adult) 2 g suppository 1 suppository  1 suppository Rectal Daily PRN Hessie Knows, MD       insulin aspart (novoLOG) injection 0-5 Units  0-5 Units Subcutaneous QHS Hessie Knows, MD   2 Units at 05/10/21 2054   insulin aspart (novoLOG) injection 0-6 Units  0-6 Units Subcutaneous TID WC Hessie Knows, MD   2 Units at 05/14/21 0824   insulin aspart (novoLOG) injection 4 Units  4 Units Subcutaneous TID WC Hessie Knows, MD   4 Units at 05/14/21 0823   insulin glargine-yfgn (SEMGLEE) injection 11 Units  11 Units Subcutaneous Daily Ralene Muskrat B, MD   11 Units at 05/14/21 0823   ketorolac (TORADOL) 15 MG/ML injection 15 mg  15 mg Intravenous Q6H Hessie Knows, MD   15 mg at 05/14/21 0636   labetalol (NORMODYNE) injection 10 mg  10 mg Intravenous Q2H PRN Hessie Knows, MD   10 mg at  05/12/21 0612   losartan (COZAAR) tablet 100 mg  100 mg Oral QPM Hessie Knows, MD   100 mg at 05/13/21 1736   morphine 2 MG/ML injection 1 mg  1 mg Intravenous Q3H PRN Hessie Knows, MD       multivitamin with minerals tablet 1 tablet  1 tablet Oral Daily Hessie Knows, MD   1 tablet at 05/14/21 0822   ondansetron (ZOFRAN) tablet 4 mg  4 mg Oral Q6H PRN Hessie Knows, MD       Or   ondansetron (  ZOFRAN) injection 4 mg  4 mg Intravenous Q6H PRN Hessie Knows, MD       oxyCODONE (Oxy IR/ROXICODONE) immediate release tablet 5 mg  5 mg Oral Q4H PRN Hessie Knows, MD   5 mg at 05/13/21 2153   polyethylene glycol (MIRALAX / GLYCOLAX) packet 17 g  17 g Oral Daily Hessie Knows, MD   17 g at 05/14/21 3007   polyethylene glycol (MIRALAX / GLYCOLAX) packet 17 g  17 g Oral Daily PRN Hessie Knows, MD       polyvinyl alcohol (LIQUIFILM TEARS) 1.4 % ophthalmic solution 1-2 drop  1-2 drop Both Eyes Daily PRN Hessie Knows, MD       senna (SENOKOT) tablet 8.6 mg  1 tablet Oral Daily Hessie Knows, MD   8.6 mg at 05/14/21 6226   sodium chloride flush (NS) 0.9 % injection 3 mL  3 mL Intravenous Q12H Hessie Knows, MD   3 mL at 05/14/21 0828   tamsulosin (FLOMAX) capsule 0.4 mg  0.4 mg Oral Daily Ralene Muskrat B, MD   0.4 mg at 05/14/21 3335   venlafaxine (EFFEXOR) tablet 75 mg  75 mg Oral BID Hessie Knows, MD   75 mg at 05/14/21 4562     Discharge Medications: Please see discharge summary for a list of discharge medications.  Relevant Imaging Results:  Relevant Lab Results:   Additional Information SS# [id number]  Harriet Masson, RN

## 2021-05-14 NOTE — Progress Notes (Signed)
Physical Therapy Treatment Patient Details Name: Erin Daniels MRN: [id number] DOB: 10/08/1945 Today's Date: 05/14/2021   History of Present Illness Pt is a 75 y.o. female with medical history significant for insulin-dependent diabetes mellitus, history of CVA, diabetic retinopathy, hypertension and hypothyroidism who was brought into the ER by her husband for evaluation following a fall the night prior to her admission. Pt found to have L olecranon process fx (NWB LUE), R 7th rib fx, and unknown age T11 compression fx. Pt s/p T10 kyphoplasty and LUE olecranon ORIF.    PT Comments    Patient in chair with sister present upon PT arrival. Patient transitioned to standing with cue for breathing five purse lipped breathing breaths prior to movement. She ambulated with HW to [facility] and attempted toileting without success (nursing notified). Patient then ambulated to door with HW with frequent cueing required for safety awareness, placement of HW, and foot sequencing. Patient does show improved gait speed and decreased need for cues compared to morning session however still is a high fall risk at this time. Patient performed strengthening interventions well and returned to bed with needs met and arm propped. Current POC remains appropriate at this time.     Recommendations for follow up therapy are one component of a multi-disciplinary discharge planning process, led by the attending physician.  Recommendations may be updated based on patient status, additional functional criteria and insurance authorization.  Follow Up Recommendations  SNF     Equipment Recommendations  Other (comment) (TBD at next venue of care)    Recommendations for Other Services       Precautions / Restrictions Precautions Precautions: Fall Required Braces or Orthoses: Other Brace Other Brace: long arm splint to LUE Restrictions Weight Bearing Restrictions: Yes LUE Weight Bearing: Non weight bearing     Mobility   Bed Mobility Overal bed mobility: Needs Assistance Bed Mobility: Sit to Supine     Supine to sit: Min assist     General bed mobility comments: requires assistance for legs, and "boosting" up in bed.    Transfers Overall transfer level: Needs assistance Equipment used: Hemi-walker Transfers: Sit to/from Stand Sit to Stand: Min assist;Min guard         General transfer comment: able to perform CGA from recliner, requires min A from [facility]  Ambulation/Gait Ambulation/Gait assistance: Min guard;Min assist Gait Distance (Feet): 18 Feet Assistive device: Hemi-walker Gait Pattern/deviations: Step-through pattern;Decreased step length - right;Decreased step length - left Gait velocity: decreased   General Gait Details: Patient requires mod/max verbal cueing for sequenced ambulation with hemiwalker. Close CGA with occasional min A for stabilization due to LOB.   Stairs             Wheelchair Mobility    Modified Rankin (Stroke Patients Only)       Balance Overall balance assessment: Needs assistance;History of Falls Sitting-balance support: No upper extremity supported;Feet unsupported Sitting balance-Leahy Scale: Fair   Postural control: Posterior lean;Right lateral lean Standing balance support: Single extremity supported;During functional activity Standing balance-Leahy Scale: Poor Standing balance comment: MIN A to sustain                            Cognition Arousal/Alertness: Awake/alert Behavior During Therapy: WFL for tasks assessed/performed Overall Cognitive Status: History of cognitive impairments - at baseline Area of Impairment: Problem solving  Problem Solving: Decreased initiation;Requires verbal cues;Requires tactile cues General Comments: Pt able to follow one step commands and is pleasant and appropriate throughout session.      Exercises Total Joint Exercises Ankle Circles/Pumps:  AROM;Strengthening;Both;10 reps Hip ABduction/ADduction: Strengthening;Both;10 reps Straight Leg Raises: 5 reps;Both;Strengthening Long Arc Quad: AROM;Strengthening;Both;10 reps Knee Flexion: AROM;Strengthening;Both;10 reps Other Exercises Other Exercises: Pivot/negotiation of BSC, safe transfer sit to stand with standing and breathing five deep breaths before attempting movement, safe ambulation with RW    General Comments        Pertinent Vitals/Pain Pain Assessment: Faces Faces Pain Scale: Hurts a little bit Pain Location: LUE Pain Descriptors / Indicators: Aching;Sore Pain Intervention(s): Limited activity within patient's tolerance;Monitored during session    Home Living                      Prior Function            PT Goals (current goals can now be found in the care plan section) Acute Rehab PT Goals Patient Stated Goal: To walk better PT Goal Formulation: With patient Time For Goal Achievement: 05/26/21 Potential to Achieve Goals: Fair Progress towards PT goals: Progressing toward goals    Frequency    BID      PT Plan Current plan remains appropriate    Co-evaluation              AM-PAC PT "6 Clicks" Mobility   Outcome Measure  Help needed turning from your back to your side while in a flat bed without using bedrails?: A Little Help needed moving from lying on your back to sitting on the side of a flat bed without using bedrails?: A Little Help needed moving to and from a bed to a chair (including a wheelchair)?: A Little Help needed standing up from a chair using your arms (e.g., wheelchair or bedside chair)?: A Little Help needed to walk in hospital room?: A Lot Help needed climbing 3-5 steps with a railing? : Total 6 Click Score: 15    End of Session Equipment Utilized During Treatment: Gait belt Activity Tolerance: Patient tolerated treatment well;Patient limited by fatigue Patient left: in bed;with call bell/phone within  reach;with bed alarm set;with family/visitor present Nurse Communication: Mobility status;Weight bearing status PT Visit Diagnosis: Unsteadiness on feet (R26.81);History of falling (Z91.81);Muscle weakness (generalized) (M62.81);Difficulty in walking, not elsewhere classified (R26.2);Pain Pain - Right/Left: Left Pain - part of body: Arm     Time: 1426-1450 PT Time Calculation (min) (ACUTE ONLY): 24 min  Charges:  $Therapeutic Exercise: 8-22 mins $Therapeutic Activity: 8-22 mins                     Janna Arch, PT, DPT  05/14/2021, 3:14 PM

## 2021-05-14 NOTE — Progress Notes (Signed)
Physical Therapy Treatment Patient Details Name: Erin Daniels MRN: [id number] DOB: 07-14-1946 Today's Date: 05/14/2021   History of Present Illness Pt is a 75 y.o. female with medical history significant for insulin-dependent diabetes mellitus, history of CVA, diabetic retinopathy, hypertension and hypothyroidism who was brought into the ER by her husband for evaluation following a fall the night prior to her admission. Pt found to have L olecranon process fx (NWB LUE), R 7th rib fx, and unknown age T11 compression fx. Pt s/p T10 kyphoplasty and LUE olecranon ORIF.    PT Comments    Patient is in bed upon PT arrival and agreeable to participate in therapy session. She requires assistance to transition to EOB and reports increased pain with transfer. Step by step simple instructions required for performance of task. STS performed with HW and patient able to ambulate to door and back with occasional Min A for stabilization due to partial LOB and mod/max verbal cueing for sequencing of HW with ambulation. Patient ambulated to chair where she performed additional strengthening interventions reporting fatigue by end of session. Alarm set with needs met. Current POC remains appropriate at this time.    Recommendations for follow up therapy are one component of a multi-disciplinary discharge planning process, led by the attending physician.  Recommendations may be updated based on patient status, additional functional criteria and insurance authorization.  Follow Up Recommendations  SNF     Equipment Recommendations  Other (comment) (TBD at next venue of care)    Recommendations for Other Services       Precautions / Restrictions Precautions Precautions: Fall Required Braces or Orthoses: Other Brace Other Brace: long arm splint to LUE Restrictions Weight Bearing Restrictions: Yes LUE Weight Bearing: Non weight bearing     Mobility  Bed Mobility Overal bed mobility: Needs  Assistance Bed Mobility: Sit to Supine     Supine to sit: Min assist     General bed mobility comments: .    Transfers Overall transfer level: Needs assistance Equipment used: Hemi-walker Transfers: Sit to/from Stand Sit to Stand: Min assist            Ambulation/Gait Ambulation/Gait assistance: Min guard;Min Web designer (Feet): 15 Feet Assistive device: Hemi-walker Gait Pattern/deviations: Step-through pattern;Decreased step length - right;Decreased step length - left Gait velocity: decreased   General Gait Details: Patient requires mod/max verbal cueing for sequenced ambulation with hemiwalker. Close CGA with occasional min A for stabilization due to LOB.   Stairs             Wheelchair Mobility    Modified Rankin (Stroke Patients Only)       Balance Overall balance assessment: Needs assistance;History of Falls Sitting-balance support: No upper extremity supported;Feet unsupported Sitting balance-Leahy Scale: Fair   Postural control: Posterior lean;Right lateral lean Standing balance support: Single extremity supported;During functional activity Standing balance-Leahy Scale: Poor Standing balance comment: MIN A to sustain                            Cognition Arousal/Alertness: Awake/alert Behavior During Therapy: WFL for tasks assessed/performed Overall Cognitive Status: History of cognitive impairments - at baseline Area of Impairment: Problem solving                             Problem Solving: Decreased initiation;Requires verbal cues;Requires tactile cues General Comments: Pt able to follow one step commands and is pleasant  and appropriate throughout session.      Exercises Total Joint Exercises Ankle Circles/Pumps: AROM;Strengthening;Both;10 reps Gluteal Sets: Strengthening;Both;10 reps Hip ABduction/ADduction: Strengthening;Both;10 reps Straight Leg Raises: 5 reps;Both;Strengthening Long Arc Quad:  AROM;Strengthening;Both;10 reps Knee Flexion: AROM;Strengthening;Both;10 reps Other Exercises Other Exercises: Patient educated on role of PT in acute care setting, safe mobility and transfers, precautions with arm, utilization of HW.    General Comments        Pertinent Vitals/Pain Pain Assessment: 0-10 Pain Score: 5  Pain Location: LUE Pain Descriptors / Indicators: Aching;Sore Pain Intervention(s): Limited activity within patient's tolerance;Monitored during session;Repositioned    Home Living                      Prior Function            PT Goals (current goals can now be found in the care plan section) Acute Rehab PT Goals Patient Stated Goal: To walk better PT Goal Formulation: With patient Time For Goal Achievement: 05/26/21 Potential to Achieve Goals: Fair Progress towards PT goals: Progressing toward goals    Frequency    BID      PT Plan Current plan remains appropriate    Co-evaluation              AM-PAC PT "6 Clicks" Mobility   Outcome Measure  Help needed turning from your back to your side while in a flat bed without using bedrails?: A Little Help needed moving from lying on your back to sitting on the side of a flat bed without using bedrails?: A Little Help needed moving to and from a bed to a chair (including a wheelchair)?: A Little Help needed standing up from a chair using your arms (e.g., wheelchair or bedside chair)?: A Little Help needed to walk in hospital room?: A Lot Help needed climbing 3-5 steps with a railing? : Total 6 Click Score: 15    End of Session Equipment Utilized During Treatment: Gait belt Activity Tolerance: Patient tolerated treatment well;Patient limited by fatigue Patient left: with call bell/phone within reach;in chair;with chair alarm set;with SCD's reapplied Nurse Communication: Mobility status;Weight bearing status PT Visit Diagnosis: Unsteadiness on feet (R26.81);History of falling  (Z91.81);Muscle weakness (generalized) (M62.81);Difficulty in walking, not elsewhere classified (R26.2);Pain Pain - Right/Left: Left Pain - part of body: Arm     Time: 6153-7943 PT Time Calculation (min) (ACUTE ONLY): 18 min  Charges:  $Therapeutic Exercise: 8-22 mins          Janna Arch, PT, DPT  05/14/2021, 9:56 AM

## 2021-05-14 NOTE — Plan of Care (Signed)
Patient sleeping between care. Pain controlled. In and out cath done overnight, 42ml output. Call bell within reach.  PLAN OF CARE ONGOING Problem: Education: Goal: Knowledge of General Education information will improve Description: Including pain rating scale, medication(s)/side effects and non-pharmacologic comfort measures Outcome: Progressing   Problem: Health Behavior/Discharge Planning: Goal: Ability to manage health-related needs will improve Outcome: Progressing   Problem: Clinical Measurements: Goal: Ability to maintain clinical measurements within normal limits will improve Outcome: Progressing Goal: Will remain free from infection Outcome: Progressing Goal: Diagnostic test results will improve Outcome: Progressing Goal: Respiratory complications will improve Outcome: Progressing Goal: Cardiovascular complication will be avoided Outcome: Progressing   Problem: Activity: Goal: Risk for activity intolerance will decrease Outcome: Progressing   Problem: Nutrition: Goal: Adequate nutrition will be maintained Outcome: Progressing   Problem: Coping: Goal: Level of anxiety will decrease Outcome: Progressing   Problem: Elimination: Goal: Will not experience complications related to bowel motility Outcome: Progressing Goal: Will not experience complications related to urinary retention Outcome: Progressing   Problem: Pain Managment: Goal: General experience of comfort will improve Outcome: Progressing   Problem: Safety: Goal: Ability to remain free from injury will improve Outcome: Progressing   Problem: Skin Integrity: Goal: Risk for impaired skin integrity will decrease Outcome: Progressing   Problem: Education: Goal: Ability to describe self-care measures that may prevent or decrease complications (Diabetes Survival Skills Education) will improve Outcome: Progressing Goal: Individualized Educational Video(s) Outcome: Progressing   Problem:  Coping: Goal: Ability to adjust to condition or change in health will improve Outcome: Progressing   Problem: Fluid Volume: Goal: Ability to maintain a balanced intake and output will improve Outcome: Progressing   Problem: Health Behavior/Discharge Planning: Goal: Ability to identify and utilize available resources and services will improve Outcome: Progressing Goal: Ability to manage health-related needs will improve Outcome: Progressing   Problem: Metabolic: Goal: Ability to maintain appropriate glucose levels will improve Outcome: Progressing   Problem: Nutritional: Goal: Maintenance of adequate nutrition will improve Outcome: Progressing Goal: Progress toward achieving an optimal weight will improve Outcome: Progressing   Problem: Skin Integrity: Goal: Risk for impaired skin integrity will decrease Outcome: Progressing   Problem: Tissue Perfusion: Goal: Adequacy of tissue perfusion will improve Outcome: Progressing   Problem: Urinary Elimination: Goal: Signs and symptoms of infection will decrease Outcome: Progressing

## 2021-05-14 NOTE — Plan of Care (Signed)
Problem: Education: Goal: Knowledge of General Education information will improve Description: Including pain rating scale, medication(s)/side effects and non-pharmacologic comfort measures 05/14/2021 1136 by Reese Stockman, Debbe Mounts, RN Outcome: Progressing 05/14/2021 1135 by Elsie Ra, RN Outcome: Progressing   Problem: Health Behavior/Discharge Planning: Goal: Ability to manage health-related needs will improve 05/14/2021 1136 by Lundon Verdejo, Debbe Mounts, RN Outcome: Progressing 05/14/2021 1135 by Elsie Ra, RN Outcome: Progressing   Problem: Clinical Measurements: Goal: Ability to maintain clinical measurements within normal limits will improve 05/14/2021 1136 by Givanni Staron, Debbe Mounts, RN Outcome: Progressing 05/14/2021 1135 by Elsie Ra, RN Outcome: Progressing Goal: Will remain free from infection 05/14/2021 1136 by Elsie Ra, RN Outcome: Progressing 05/14/2021 1135 by Elsie Ra, RN Outcome: Progressing Goal: Diagnostic test results will improve 05/14/2021 1136 by Elsie Ra, RN Outcome: Progressing 05/14/2021 1135 by Elsie Ra, RN Outcome: Progressing Goal: Respiratory complications will improve 05/14/2021 1136 by Elsie Ra, RN Outcome: Progressing 05/14/2021 1135 by Elsie Ra, RN Outcome: Progressing Goal: Cardiovascular complication will be avoided 05/14/2021 1136 by Elsie Ra, RN Outcome: Progressing 05/14/2021 1135 by Elsie Ra, RN Outcome: Progressing   Problem: Activity: Goal: Risk for activity intolerance will decrease 05/14/2021 1136 by Perkins Molina, Debbe Mounts, RN Outcome: Progressing 05/14/2021 1135 by Elsie Ra, RN Outcome: Progressing   Problem: Nutrition: Goal: Adequate nutrition will be maintained 05/14/2021 1136 by Elsie Ra, RN Outcome: Progressing 05/14/2021 1135 by Elsie Ra, RN Outcome: Progressing   Problem: Coping: Goal: Level of anxiety will  decrease 05/14/2021 1136 by Charon Akamine, Debbe Mounts, RN Outcome: Progressing 05/14/2021 1135 by Elsie Ra, RN Outcome: Progressing   Problem: Elimination: Goal: Will not experience complications related to bowel motility 05/14/2021 1136 by Elsie Ra, RN Outcome: Progressing 05/14/2021 1135 by Elsie Ra, RN Outcome: Progressing Goal: Will not experience complications related to urinary retention 05/14/2021 1136 by Letishia Elliott, Debbe Mounts, RN Outcome: Progressing 05/14/2021 1135 by Elsie Ra, RN Outcome: Progressing   Problem: Pain Managment: Goal: General experience of comfort will improve 05/14/2021 1136 by Keoki Mchargue, Debbe Mounts, RN Outcome: Progressing 05/14/2021 1135 by Elsie Ra, RN Outcome: Progressing   Problem: Safety: Goal: Ability to remain free from injury will improve 05/14/2021 1136 by Alton Tremblay, Debbe Mounts, RN Outcome: Progressing 05/14/2021 1135 by Elsie Ra, RN Outcome: Progressing   Problem: Skin Integrity: Goal: Risk for impaired skin integrity will decrease 05/14/2021 1136 by Kaylon Laroche, Debbe Mounts, RN Outcome: Progressing 05/14/2021 1135 by Elsie Ra, RN Outcome: Progressing   Problem: Education: Goal: Ability to describe self-care measures that may prevent or decrease complications (Diabetes Survival Skills Education) will improve 05/14/2021 1136 by Elsie Ra, RN Outcome: Progressing 05/14/2021 1135 by Elsie Ra, RN Outcome: Progressing Goal: Individualized Educational Video(s) 05/14/2021 1136 by Elsie Ra, RN Outcome: Progressing 05/14/2021 1135 by Elsie Ra, RN Outcome: Progressing   Problem: Coping: Goal: Ability to adjust to condition or change in health will improve 05/14/2021 1136 by Paizley Ramella, Debbe Mounts, RN Outcome: Progressing 05/14/2021 1135 by Elsie Ra, RN Outcome: Progressing   Problem: Fluid Volume: Goal: Ability to maintain a balanced intake and output will  improve 05/14/2021 1136 by Maksym Pfiffner, Debbe Mounts, RN Outcome: Progressing 05/14/2021 1135 by Elsie Ra, RN Outcome: Progressing   Problem: Health Behavior/Discharge Planning: Goal: Ability to identify and utilize available resources and services will improve 05/14/2021 1136 by Deztiny Sarra, Debbe Mounts, RN Outcome: Progressing 05/14/2021 1135 by Elsie Ra,  RN Outcome: Progressing Goal: Ability to manage health-related needs will improve 05/14/2021 1136 by Benedicta Sultan, Debbe Mounts, RN Outcome: Progressing 05/14/2021 1135 by Elsie Ra, RN Outcome: Progressing   Problem: Metabolic: Goal: Ability to maintain appropriate glucose levels will improve 05/14/2021 1136 by Tiondra Fang, Debbe Mounts, RN Outcome: Progressing 05/14/2021 1135 by Elsie Ra, RN Outcome: Progressing   Problem: Nutritional: Goal: Maintenance of adequate nutrition will improve 05/14/2021 1136 by Elsie Ra, RN Outcome: Progressing 05/14/2021 1135 by Elsie Ra, RN Outcome: Progressing Goal: Progress toward achieving an optimal weight will improve 05/14/2021 1136 by Sera Hitsman, Debbe Mounts, RN Outcome: Progressing 05/14/2021 1135 by Elsie Ra, RN Outcome: Progressing   Problem: Skin Integrity: Goal: Risk for impaired skin integrity will decrease 05/14/2021 1136 by Cortney Beissel, Debbe Mounts, RN Outcome: Progressing 05/14/2021 1135 by Elsie Ra, RN Outcome: Progressing   Problem: Tissue Perfusion: Goal: Adequacy of tissue perfusion will improve 05/14/2021 1136 by Leoda Smithhart, Debbe Mounts, RN Outcome: Progressing 05/14/2021 1135 by Elsie Ra, RN Outcome: Progressing   Problem: Urinary Elimination: Goal: Signs and symptoms of infection will decrease 05/14/2021 1136 by Viney Acocella, Debbe Mounts, RN Outcome: Progressing 05/14/2021 1135 by Elsie Ra, RN Outcome: Progressing   Problem: Urinary Elimination: Goal: Signs and symptoms of infection will decrease Outcome: Progressing

## 2021-05-14 NOTE — Progress Notes (Signed)
PROGRESS NOTE    Erin Daniels  WNI:[id number] DOB: [birth year] DOA: 05/07/2021 PCP: Juluis Pitch, MD    Brief Narrative:  75 y.o. female with medical history significant for insulin-dependent diabetes mellitus, history of CVA, diabetic retinopathy, hypertension and hypothyroidism who was brought into the ER by her husband for evaluation following a fall the night prior to her admission. Patient was started on antibiotic therapy 2 days prior to this hospitalization for UTI and was also placed on tramadol for pain control.  At baseline she usually ambulates with a rolling walker and according to the husband she had gone to use the bathroom and on her way out forgot to take her walker and fell landing on her left elbow.  He was able to assist her up but noticed significant swelling involving the left elbow and then patient started complaining of pain in the right lateral chest wall.  She rated her pain a 6 to 7 x 10 in intensity at its worst and left elbow pain is worse with any form of movement activity. She complains of urinary frequency.  Orthopedics on consult with plan for ORIF of left olecranon and kyphoplasty at T11.  Patient successfully underwent left upper extremity ORIF and T11 kyphoplasty on 9/22.  Tolerated procedure well with no immediate complications.  Postoperative day 1 her main complaint is left elbow pain.  She endorses pain in her back is improved.  Has not ambulated yet.   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)   Hypothyroidism   Olecranon fracture, left, closed, initial encounter   Fracture of one rib, right side, initial encounter for closed fracture   Olecranon fracture  Fracture of left olecranon process Ortho following ORIF done 9/22 Plan: As needed pain control Restart Plavix today  9/24  Delirium 2/2 pain, narcotics - delirium precautions - minimize opioids to the extent we can - gabapentin dose decreased -Added Toradol for pain control   Right axillary lymphadenopathy Read says concerning for malignancy. 11/2020 mammo birads 1 - will need outpt f/u   Urinary retention Persistent PVRs >300, has required I/O cath multiple times -Foley was discontinued 9/23 -Patient has been retaining -Required straight cath x2 -Continue Flomax 0.4 mg daily -If continues to retain may require replacement of Foley catheter   Right 7th rib fracture T10 compression fracture Ortho following T11 kyphoplasty done 9/22 Plavix was on hold.  Will restart today    T1DM On insulin pump at home. Glucose mildly elevated this am. Pump has been removed - dm educator consulted -Sliding scale, NovoLog, Lantus 11 units daily   Bacteriuria Abnormal ua on 9/15 at pcp's office. Pt denies uti symptoms. Given ceftriaxone here, now discontinued. Urine culture negative   HTN Here bp elevated -Continue losartan 100 daily -Ensure pain control   Chronic pain - home gabapentin (decreased evening dose due to sedation) and effexor    Hx CVA - cont home statin - hold asa/plavix in preparation for surgery   DVT prophylaxis: SQ Lovenox Code Status: Full Family Communication:   spouse [name] [phone] on 9/22.  Left VM on 9/24 Disposition Plan: Status is: Inpatient  Remains inpatient appropriate because:Inpatient level of care appropriate due to severity of illness  Dispo: The patient is from: Home              Anticipated  d/c is to: SNF              Patient currently is not medically stable to d/c.   Difficult to place patient No  Postoperative day #2.  Therapy evaluations, pain control.  Antiplatelet agents restarted.  Patient continues to retain urine    Level of care: Med-Surg  Consultants:  Orthopedics  Procedures:  ORIF left olecranon 9/22 T11 kyphoplasty  9/22  Antimicrobials:  None   Subjective: Patient seen and examined.  Sitting up in chair.  Improved pain control left elbow and lower back.  Objective: Vitals:   05/13/21 2100 05/13/21 2320 05/14/21 0421 05/14/21 0819  BP: (!) 116/51 (!) 128/55 (!) 158/70 (!) 172/75  Pulse: 79 81 82 95  Resp: 17 18 16 16   Temp: 98 F (36.7 C) 98.1 F (36.7 C) 98 F (36.7 C) 98.3 F (36.8 C)  TempSrc: Oral Oral    SpO2: 94% 95% 99% 95%  Weight:      Height:        Intake/Output Summary (Last 24 hours) at 05/14/2021 1103 Last data filed at 05/14/2021 1003 Gross per 24 hour  Intake 363 ml  Output 1000 ml  Net -637 ml   Filed Weights   05/07/21 2217  Weight: 53.5 kg    Examination:  General exam: No acute distress.  Sitting up in chair Respiratory system: Clear.  Normal work of breathing.  Room air Cardiovascular system: S1-S2, RRR, no murmurs, no pedal edema Gastrointestinal system: Soft, nontender, nondistended, normal bowel sounds Central nervous system: Alert and oriented. No focal neurological deficits. Extremities: Decreased power bilateral lower extremities.  Limited range of motion but improved from prior. Skin: No rashes, lesions or ulcers Psychiatry: Judgement and insight appear normal. Mood & affect appropriate.     Data Reviewed: I have personally reviewed following labs and imaging studies  CBC: Recent Labs  Lab 05/07/21 1523 05/08/21 0605 05/09/21 0620  WBC 8.4 9.0 12.3*  NEUTROABS 6.4  --   --   HGB 10.6* 10.0* 9.6*  HCT 31.3* 29.7* 27.6*  MCV 94.6 92.8 93.2  PLT 248 234 277   Basic Metabolic Panel: Recent Labs  Lab 05/09/21 0620 05/10/21 0406 05/11/21 0334 05/12/21 0456 05/13/21 0420  NA 132* 131* 128* 124* 128*  K 4.1 3.9 4.2 4.3 4.7  CL 100 97* 94* 90* 96*  CO2 25 26 24 24 24   GLUCOSE 191* 166* 291* 271* 243*  BUN 18 15 13 17 14   CREATININE 0.51 0.52 0.59 0.56 0.59  CALCIUM 8.6* 8.5* 8.1* 8.3* 7.6*   GFR: Estimated Creatinine Clearance:  49.7 mL/min (by C-G formula based on SCr of 0.59 mg/dL). Liver Function Tests: Recent Labs  Lab 05/07/21 1523  AST 25  ALT 18  ALKPHOS 78  BILITOT 0.4  PROT 6.9  ALBUMIN 3.6   No results for input(s): LIPASE, AMYLASE in the last 168 hours. No results for input(s): AMMONIA in the last 168 hours. Coagulation Profile: No results for input(s): INR, PROTIME in the last 168 hours. Cardiac Enzymes: Recent Labs  Lab 05/07/21 2201  CKTOTAL 188   BNP (last 3 results) No results for input(s): PROBNP in the last 8760 hours. HbA1C: No results for input(s): HGBA1C in the last 72 hours. CBG: Recent Labs  Lab 05/13/21 1523 05/13/21 2047 05/13/21 2343 05/14/21 0635 05/14/21 0805  GLUCAP 206* 99 109* 183* 212*   Lipid Profile: No results for input(s): CHOL, HDL, LDLCALC, TRIG, CHOLHDL, LDLDIRECT in the last 72 hours.  Thyroid Function Tests: No results for input(s): TSH, T4TOTAL, FREET4, T3FREE, THYROIDAB in the last 72 hours. Anemia Panel: No results for input(s): VITAMINB12, FOLATE, FERRITIN, TIBC, IRON, RETICCTPCT in the last 72 hours. Sepsis Labs: No results for input(s): PROCALCITON, LATICACIDVEN in the last 168 hours.  Recent Results (from the past 240 hour(s))  Resp Panel by RT-PCR (Flu A&B, Covid) Nasopharyngeal Swab     Status: None   Collection Time: 05/07/21  3:53 PM   Specimen: Nasopharyngeal Swab; Nasopharyngeal(NP) swabs in vial transport medium  Result Value Ref Range Status   SARS Coronavirus 2 by RT PCR NEGATIVE NEGATIVE Final    Comment: (NOTE) SARS-CoV-2 target nucleic acids are NOT DETECTED.  The SARS-CoV-2 RNA is generally detectable in upper respiratory specimens during the acute phase of infection. The lowest concentration of SARS-CoV-2 viral copies this assay can detect is 138 copies/mL. A negative result does not preclude SARS-Cov-2 infection and should not be used as the sole basis for treatment or other patient management decisions. A negative result  may occur with  improper specimen collection/handling, submission of specimen other than nasopharyngeal swab, presence of viral mutation(s) within the areas targeted by this assay, and inadequate number of viral copies(<138 copies/mL). A negative result must be combined with clinical observations, patient history, and epidemiological information. The expected result is Negative.  Fact Sheet for Patients:  EntrepreneurPulse.com.au  Fact Sheet for Healthcare Providers:  IncredibleEmployment.be  This test is no t yet approved or cleared by the [place] FDA and  has been authorized for detection and/or diagnosis of SARS-CoV-2 by FDA under an Emergency Use Authorization (EUA). This EUA will remain  in effect (meaning this test can be used) for the duration of the COVID-19 declaration under Section 564(b)(1) of the Act, 21 U.S.C.section 360bbb-3(b)(1), unless the authorization is terminated  or revoked sooner.       Influenza A by PCR NEGATIVE NEGATIVE Final   Influenza B by PCR NEGATIVE NEGATIVE Final    Comment: (NOTE) The Xpert Xpress SARS-CoV-2/FLU/RSV plus assay is intended as an aid in the diagnosis of influenza from Nasopharyngeal swab specimens and should not be used as a sole basis for treatment. Nasal washings and aspirates are unacceptable for Xpert Xpress SARS-CoV-2/FLU/RSV testing.  Fact Sheet for Patients: EntrepreneurPulse.com.au  Fact Sheet for Healthcare Providers: IncredibleEmployment.be  This test is not yet approved or cleared by the [place] FDA and has been authorized for detection and/or diagnosis of SARS-CoV-2 by FDA under an Emergency Use Authorization (EUA). This EUA will remain in effect (meaning this test can be used) for the duration of the COVID-19 declaration under Section 564(b)(1) of the Act, 21 U.S.C. section 360bbb-3(b)(1), unless the authorization is terminated  or revoked.  Performed at [facility], [address], [place], [place] [address]   Urine Culture     Status: None   Collection Time: 05/08/21  4:30 AM   Specimen: Urine, Clean Catch  Result Value Ref Range Status   Specimen Description   Final    URINE, CLEAN CATCH Performed at [facility], [address] [place], [place] [address]    Special Requests   Final    NONE Performed at [facility], [address], [place], [place] [address]    Culture   Final    NO GROWTH Performed at [facility], [place] [address] [place],  [address]    Report Status 05/09/2021 FINAL  Final  Radiology Studies: DG Thoracic Spine 2 View  Result Date: 05/12/2021 CLINICAL DATA:  Surgery, elective. Additional history provided: T11 kyphoplasty. Provided fluoroscopy time 1 minute. EXAM: THORACIC SPINE 2 VIEWS; DG C-ARM 1-60 MIN COMPARISON:  MRI of the thoracic and lumbar spine 05/09/2021. FINDINGS: An AP view intraprocedural fluoroscopic cine clip and a lateral view intraprocedural fluoroscopic image of the thoracic spine is submitted. On the provided images, kyphoplasty material is present within a thoracic vertebral body. The thoracic vertebral level can not be ascertained given the provided field of view. Correlate with the procedural history. IMPRESSION: Intraprocedural fluoroscopic images from thoracic vertebral kyphoplasty, as described. Per the provided history, kyphoplasty was performed at the T11 level. However, the level of kyphoplasty is difficult to ascertain on today's provided images given the field of view. An acute to subacute compression fracture of the T10 inferior endplate was noted on the recent prior thoracic spine MRI of 05/09/2021. Correlate with the procedural history. Electronically Signed   By: Kellie Simmering D.O.   On: 05/12/2021 17:44   DG C-Arm 1-60 Min  Result Date: 05/12/2021 CLINICAL DATA:  Surgery, elective. Additional  history provided: T11 kyphoplasty. Provided fluoroscopy time 1 minute. EXAM: THORACIC SPINE 2 VIEWS; DG C-ARM 1-60 MIN COMPARISON:  MRI of the thoracic and lumbar spine 05/09/2021. FINDINGS: An AP view intraprocedural fluoroscopic cine clip and a lateral view intraprocedural fluoroscopic image of the thoracic spine is submitted. On the provided images, kyphoplasty material is present within a thoracic vertebral body. The thoracic vertebral level can not be ascertained given the provided field of view. Correlate with the procedural history. IMPRESSION: Intraprocedural fluoroscopic images from thoracic vertebral kyphoplasty, as described. Per the provided history, kyphoplasty was performed at the T11 level. However, the level of kyphoplasty is difficult to ascertain on today's provided images given the field of view. An acute to subacute compression fracture of the T10 inferior endplate was noted on the recent prior thoracic spine MRI of 05/09/2021. Correlate with the procedural history. Electronically Signed   By: Kellie Simmering D.O.   On: 05/12/2021 17:44   DG MINI C-ARM IMAGE ONLY  Result Date: 05/12/2021 There is no interpretation for this exam.  This order is for images obtained during a surgical procedure.  Please See "Surgeries" Tab for more information regarding the procedure.        Scheduled Meds:  acetaminophen  1,000 mg Oral TID   atorvastatin  40 mg Oral Daily   brimonidine  1 drop Right Eye TID   And   timolol  1 drop Right Eye TID   calcium-vitamin D  1 tablet Oral QPM   Chlorhexidine Gluconate Cloth  6 each Topical Daily   cholecalciferol  5,000 Units Oral Daily   clopidogrel  75 mg Oral QPM   docusate sodium  100 mg Oral BID   gabapentin  300 mg Oral QHS   And   gabapentin  300 mg Oral BID   insulin aspart  0-5 Units Subcutaneous QHS   insulin aspart  0-6 Units Subcutaneous TID WC   insulin aspart  4 Units Subcutaneous TID WC   insulin glargine-yfgn  11 Units Subcutaneous  Daily   ketorolac  15 mg Intravenous Q6H   losartan  100 mg Oral QPM   multivitamin with minerals  1 tablet Oral Daily   polyethylene glycol  17 g Oral Daily   senna  1 tablet Oral Daily   sodium chloride flush  3 mL Intravenous Q12H   tamsulosin  0.4 mg Oral Daily   venlafaxine  75 mg Oral BID   Continuous Infusions:     LOS: 6 days    Time spent: 25 minutes    Sidney Ace, MD [facility] Pager [phone]  If 7PM-7AM, please contact night-coverage 05/14/2021, 11:03 AM

## 2021-05-14 NOTE — Progress Notes (Signed)
Subjective: 2 Days Post-Op Procedure(s) (LRB): OPEN REDUCTION INTERNAL FIXATION (ORIF) ELBOW/OLECRANON FRACTURE (Left) KYPHOPLASTY - T11 (N/A) Pain in the back has improved. Does have pain and swelling in the left arm splint. Denies any N/T to the left arm. Patient is passing gas, denies any N/V. Denies any chest pain this AM.  Objective: Vital signs in last 24 hours: Temp:  [98 F (36.7 C)-98.3 F (36.8 C)] 98.3 F (36.8 C) (09/24 0819) Pulse Rate:  [79-95] 95 (09/24 0819) Resp:  [16-18] 16 (09/24 0819) BP: (104-172)/(47-75) 172/75 (09/24 0819) SpO2:  [94 %-99 %] 95 % (09/24 0819)  Intake/Output from previous day: 09/23 0701 - 09/24 0700 In: 243 [P.O.:240; I.V.:3] Out: 1000 [Urine:1000] Intake/Output this shift: No intake/output data recorded.  No results for input(s): HGB in the last 72 hours. No results for input(s): WBC, RBC, HCT, PLT in the last 72 hours. Recent Labs    05/12/21 0456 05/13/21 0420  NA 124* 128*  K 4.3 4.7  CL 90* 96*  CO2 24 24  BUN 17 14  CREATININE 0.56 0.59  GLUCOSE 271* 243*  CALCIUM 8.3* 7.6*   No results for input(s): LABPT, INR in the last 72 hours.  EXAM General - Patient is Alert, Appropriate, and Oriented Left upper extremity - Neurovascular intact Sensation intact distally Intact pulses distally Posterior splint intact, no drainage.  Splint was loosened around the hand, moderate swelling present. She is able to gently flex and extend the fingers without significant pain. Cap refill intact to each digit to the left hand. Thoracolumbar spine -no spinous process tenderness.  Mild paravertebral muscle tenderness.  Incision site intact Motor Function - intact, moving foot and toes well on exam.   Past Medical History:  Diagnosis Date   Angina pectoris (HCC)    At high risk for falls    Cancer [facility])    Carpal tunnel syndrome    BILATERAL   Cervicalgia    Complication of anesthesia    difficult to arouse   Diabetes mellitus  without complication (HCC)    Type I   Diabetic neuropathy (HCC)    Diabetic neuropathy (HCC)    Diabetic retinopathy ([place])    Diabetic retinopathy ([place])    Dizziness    Gastroparesis    Headache    MIGRAINE   Herniated disc, cervical    Hypercholesteremia    Hypertension    Hypokalemia    Hypotensive episode    Hypothyroidism    Insulin pump in place    Myofascial muscle pain    Numbness of arm    left   Osteoporosis    Poor vision    Rosacea    Seizures (HCC)    Slurred speech    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   Stroke ([place])    x5   Vertigo    Wears glasses     Assessment/Plan:   2 Days Post-Op Procedure(s) (LRB): OPEN REDUCTION INTERNAL FIXATION (ORIF) ELBOW/OLECRANON FRACTURE (Left) KYPHOPLASTY - T11 (N/A) Principal Problem:   Fall Active Problems:   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  Weight as of this encounter: 53.5 kg. Advance diet Up with therapy  Activities as tolerated in the splint.  Non-weightbearing to the left arm. Keep the left arm splint clean.  Splint was loosened slightly.  Towel given for her to squeeze with her left hand. Keep left arm elevated above heart level. Follow-up with [facility] orthopaedics in 1 weeks for dressing change and skin check.  DVT Prophylaxis -  Plavix  Raquel Petar Mucci, PA-C [place] 05/14/2021, 9:09 AM

## 2021-05-14 NOTE — Progress Notes (Signed)
   05/14/21 1700  Provider Notification  Provider Name/Title Sreenath,MD  Date Provider Notified 05/14/21  Time Provider Notified 1712  Notification Type Page  Notification Reason Other (Comment) (FSBS 92)  Provider response Other (Comment) (hold this dose of insulin.)  Date of Provider Response 05/14/21  Time of Provider Response [phone]

## 2021-05-14 NOTE — TOC Progression Note (Addendum)
Transition of Care [facility]) - Progression Note    Patient Details  Name: Erin Daniels MRN: [id number] Date of Birth: [birth year]  Transition of Care [facility]) CM/SW Contact  Harriet Masson, RN Phone Number: [phone] 05/14/2021, 1:39 PM  Clinical Narrative:  Recommendations for SNF. Spoke with pt concerning preferences for SNF. Pt requesting [facility] (history) however open to other facilities. Pt has a supportive spouse Erin Daniels) who will provide transportation upon discharge and to all her medical appointments post-op discharged from SNF. Will also obtain all her ongoing medications as prescribed.   Spoke with Magda Paganini at [facility] for consideration for SNF placement due to pt's first choice. Will review for placement.   [employer] authorization process started today with reference # [id number].  TOC remains available for ongoing needs.        Expected Discharge Plan and Services                                                 Social Determinants of Health (SDOH) Interventions    Readmission Risk Interventions Readmission Risk Prevention Plan 12/07/2018  Transportation Screening Complete  HRI or [place] Complete  Social Work Consult for [place] Planning/Counseling Complete  Medication Review Press photographer) Complete  Some recent data might be hidden

## 2021-05-15 LAB — GLUCOSE, CAPILLARY
Glucose-Capillary: 145 mg/dL — ABNORMAL HIGH (ref 70–99)
Glucose-Capillary: 162 mg/dL — ABNORMAL HIGH (ref 70–99)
Glucose-Capillary: 192 mg/dL — ABNORMAL HIGH (ref 70–99)
Glucose-Capillary: 290 mg/dL — ABNORMAL HIGH (ref 70–99)
Glucose-Capillary: 85 mg/dL (ref 70–99)

## 2021-05-15 MED ORDER — COVID-19MRNA BIVAL VACC PFIZER 30 MCG/0.3ML IM SUSP
0.3000 mL | Freq: Once | INTRAMUSCULAR | Status: DC
  Filled 2021-05-15 (×2): qty 0.3

## 2021-05-15 MED ORDER — HYDRALAZINE HCL 20 MG/ML IJ SOLN
10.0000 mg | INTRAMUSCULAR | Status: DC | PRN
  Administered 2021-05-15: 10 mg via INTRAVENOUS
  Filled 2021-05-15: qty 1

## 2021-05-15 NOTE — Progress Notes (Signed)
PROGRESS NOTE    Erin Daniels  IOE:[id number] DOB: [birth year] DOA: 05/07/2021 PCP: Juluis Pitch, MD    Brief Narrative:  75 y.o. female with medical history significant for insulin-dependent diabetes mellitus, history of CVA, diabetic retinopathy, hypertension and hypothyroidism who was brought into the ER by her husband for evaluation following a fall the night prior to her admission. Patient was started on antibiotic therapy 2 days prior to this hospitalization for UTI and was also placed on tramadol for pain control.  At baseline she usually ambulates with a rolling walker and according to the husband she had gone to use the bathroom and on her way out forgot to take her walker and fell landing on her left elbow.  He was able to assist her up but noticed significant swelling involving the left elbow and then patient started complaining of pain in the right lateral chest wall.  She rated her pain a 6 to 7 x 10 in intensity at its worst and left elbow pain is worse with any form of movement activity. She complains of urinary frequency.  Orthopedics on consult with plan for ORIF of left olecranon and kyphoplasty at T11.  Patient successfully underwent left upper extremity ORIF and T11 kyphoplasty on 9/22.  Tolerated procedure well with no immediate complications.  Postoperative day 1 her main complaint is left elbow pain.  She endorses pain in her back is improved.   Has been able to ambulate with physical therapy.  Recommendation for skilled nursing facility.  Also postoperative urinary retention improved.  No indication for Foley replacement.   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 11/2020 mammo birads 1 - will need outpt f/u   Urinary retention Persistent PVRs >300, has required I/O cath multiple times -Foley was discontinued 9/23 -Patient has been retaining -Required straight cath x2 -Continue Flomax 0.4 mg daily -Puts out approximately 200 to 250 cc of urine at a time -Ambulate and exert as tolerated.  Defer Foley replacement for now   Right 7th rib fracture T10 compression fracture Ortho following T11 kyphoplasty done 9/22 Plavix restarted Ensure pain control    T1DM On insulin pump at home. Glucose mildly elevated this am. Pump has been removed - dm educator consulted -Sliding scale, NovoLog, Lantus 11 units daily   Bacteriuria Abnormal ua on 9/15 at pcp's office. Pt denies uti symptoms. Given ceftriaxone here, now discontinued. Urine culture negative   HTN Here bp elevated -Continue losartan 100 daily -Ensure pain control   Chronic pain - home gabapentin (decreased evening dose due to sedation) and effexor    Hx CVA - cont home statin - hold asa/plavix in preparation for surgery   DVT prophylaxis: SQ Lovenox Code Status: Full Family Communication:   spouse [name] [phone] on 9/22.  Left VM on 9/24 Disposition Plan: Status is: Inpatient  Remains inpatient  appropriate because:Inpatient level of care appropriate due to severity of illness  Dispo: The patient is from: Home              Anticipated d/c is to: SNF              Patient currently is not medically stable to d/c.   Difficult to place patient No  Postoperative day #3.  Therapy evaluations, pain  control.  Antiplatelet agents restarted.  Patient continues to retain urine    Level of care: Med-Surg  Consultants:  Orthopedics  Procedures:  ORIF left olecranon 9/22 T11 kyphoplasty 9/22  Antimicrobials:  None   Subjective: Patient seen and examined.  Lying in bed.  In better spirits this morning.  Improved pain control.  Objective: Vitals:   05/14/21 2010 05/14/21 2314 05/15/21 0457 05/15/21 0800  BP: 140/78 (!) 142/67 (!) 178/70 (!) 172/75  Pulse: 81 76 85 79  Resp: 17 16 17 15   Temp: 98.4 F (36.9 C) 98.7 F (37.1 C) 98.4 F (36.9 C) 98.4 F (36.9 C)  TempSrc: Oral   Oral  SpO2: 96% 96% 95% 97%  Weight:      Height:        Intake/Output Summary (Last 24 hours) at 05/15/2021 1045 Last data filed at 05/15/2021 1032 Gross per 24 hour  Intake 363 ml  Output 1750 ml  Net -1387 ml   Filed Weights   05/07/21 2217  Weight: 53.5 kg    Examination:  General exam: No acute distress.  Lying in bed Respiratory system: Clear.  Normal work of breathing.  Room air Cardiovascular system: S1-S2, RRR, no murmurs, no pedal edema Gastrointestinal system: Soft, nontender, nondistended, normal bowel sounds Central nervous system: Alert and oriented. No focal neurological deficits. Extremities: Decreased power bilateral lower extremities.  Left upper extremity in surgical wraps Skin: No rashes, lesions or ulcers Psychiatry: Judgement and insight appear normal. Mood & affect appropriate.     Data Reviewed: I have personally reviewed following labs and imaging studies  CBC: Recent Labs  Lab 05/09/21 0620  WBC 12.3*  HGB 9.6*  HCT 27.6*  MCV 93.2  PLT 536   Basic Metabolic Panel: Recent Labs  Lab 05/09/21 0620 05/10/21 0406 05/11/21 0334 05/12/21 0456 05/13/21 0420  NA 132* 131* 128* 124* 128*  K 4.1 3.9 4.2 4.3 4.7  CL 100 97* 94* 90* 96*  CO2 25 26 24 24 24   GLUCOSE 191* 166* 291* 271* 243*  BUN 18 15 13 17 14   CREATININE 0.51 0.52 0.59 0.56 0.59   CALCIUM 8.6* 8.5* 8.1* 8.3* 7.6*   GFR: Estimated Creatinine Clearance: 49.7 mL/min (by C-G formula based on SCr of 0.59 mg/dL). Liver Function Tests: No results for input(s): AST, ALT, ALKPHOS, BILITOT, PROT, ALBUMIN in the last 168 hours.  No results for input(s): LIPASE, AMYLASE in the last 168 hours. No results for input(s): AMMONIA in the last 168 hours. Coagulation Profile: No results for input(s): INR, PROTIME in the last 168 hours. Cardiac Enzymes: No results for input(s): CKTOTAL, CKMB, CKMBINDEX, TROPONINI in the last 168 hours.  BNP (last 3 results) No results for input(s): PROBNP in the last 8760 hours. HbA1C: No results for input(s): HGBA1C in the last 72 hours. CBG: Recent Labs  Lab 05/14/21 0805 05/14/21 1208 05/14/21 1653 05/14/21 1958 05/15/21 0729  GLUCAP 212* 165* 92 255* 290*   Lipid Profile: No results for input(s): CHOL, HDL, LDLCALC, TRIG, CHOLHDL, LDLDIRECT in the last 72 hours. Thyroid Function  Tests: No results for input(s): TSH, T4TOTAL, FREET4, T3FREE, THYROIDAB in the last 72 hours. Anemia Panel: No results for input(s): VITAMINB12, FOLATE, FERRITIN, TIBC, IRON, RETICCTPCT in the last 72 hours. Sepsis Labs: No results for input(s): PROCALCITON, LATICACIDVEN in the last 168 hours.  Recent Results (from the past 240 hour(s))  Resp Panel by RT-PCR (Flu A&B, Covid) Nasopharyngeal Swab     Status: None   Collection Time: 05/07/21  3:53 PM   Specimen: Nasopharyngeal Swab; Nasopharyngeal(NP) swabs in vial transport medium  Result Value Ref Range Status   SARS Coronavirus 2 by RT PCR NEGATIVE NEGATIVE Final    Comment: (NOTE) SARS-CoV-2 target nucleic acids are NOT DETECTED.  The SARS-CoV-2 RNA is generally detectable in upper respiratory specimens during the acute phase of infection. The lowest concentration of SARS-CoV-2 viral copies this assay can detect is 138 copies/mL. A negative result does not preclude SARS-Cov-2 infection and should  not be used as the sole basis for treatment or other patient management decisions. A negative result may occur with  improper specimen collection/handling, submission of specimen other than nasopharyngeal swab, presence of viral mutation(s) within the areas targeted by this assay, and inadequate number of viral copies(<138 copies/mL). A negative result must be combined with clinical observations, patient history, and epidemiological information. The expected result is Negative.  Fact Sheet for Patients:  EntrepreneurPulse.com.au  Fact Sheet for Healthcare Providers:  IncredibleEmployment.be  This test is no t yet approved or cleared by the [place] FDA and  has been authorized for detection and/or diagnosis of SARS-CoV-2 by FDA under an Emergency Use Authorization (EUA). This EUA will remain  in effect (meaning this test can be used) for the duration of the COVID-19 declaration under Section 564(b)(1) of the Act, 21 U.S.C.section 360bbb-3(b)(1), unless the authorization is terminated  or revoked sooner.       Influenza A by PCR NEGATIVE NEGATIVE Final   Influenza B by PCR NEGATIVE NEGATIVE Final    Comment: (NOTE) The Xpert Xpress SARS-CoV-2/FLU/RSV plus assay is intended as an aid in the diagnosis of influenza from Nasopharyngeal swab specimens and should not be used as a sole basis for treatment. Nasal washings and aspirates are unacceptable for Xpert Xpress SARS-CoV-2/FLU/RSV testing.  Fact Sheet for Patients: EntrepreneurPulse.com.au  Fact Sheet for Healthcare Providers: IncredibleEmployment.be  This test is not yet approved or cleared by the [place] FDA and has been authorized for detection and/or diagnosis of SARS-CoV-2 by FDA under an Emergency Use Authorization (EUA). This EUA will remain in effect (meaning this test can be used) for the duration of the COVID-19 declaration under  Section 564(b)(1) of the Act, 21 U.S.C. section 360bbb-3(b)(1), unless the authorization is terminated or revoked.  Performed at [facility], [address] [place], [place] [address]   Urine Culture     Status: None   Collection Time: 05/08/21  4:30 AM   Specimen: Urine, Clean Catch  Result Value Ref Range Status   Specimen Description   Final    URINE, CLEAN CATCH Performed at [facility], [address], [place], [place] [address]    Special Requests   Final    NONE Performed at [facility], [address], [place], [place] [address]    Culture   Final    NO GROWTH Performed at [facility], [place] [address], [place], [place] [address]    Report Status 05/09/2021 FINAL  Final         Radiology  Studies: No results found.      Scheduled Meds:  acetaminophen  1,000 mg Oral TID   atorvastatin  40 mg Oral Daily   brimonidine  1 drop Right Eye TID   And   timolol  1 drop Right Eye TID   calcium-vitamin D  1 tablet Oral QPM   Chlorhexidine Gluconate Cloth  6 each Topical Daily   cholecalciferol  5,000 Units Oral Daily   clopidogrel  75 mg Oral QPM   docusate sodium  100 mg Oral BID   gabapentin  300 mg Oral QHS   And   gabapentin  300 mg Oral BID   insulin aspart  0-5 Units Subcutaneous QHS   insulin aspart  0-6 Units Subcutaneous TID WC   insulin aspart  4 Units Subcutaneous TID WC   insulin glargine-yfgn  11 Units Subcutaneous Daily   ketorolac  15 mg Intravenous Q6H   losartan  100 mg Oral QPM   multivitamin with minerals  1 tablet Oral Daily   polyethylene glycol  17 g Oral Daily   senna  1 tablet Oral Daily   sodium chloride flush  3 mL Intravenous Q12H   tamsulosin  0.4 mg Oral Daily   venlafaxine  75 mg Oral BID   Continuous Infusions:     LOS: 7 days    Time spent: 25 minutes    Sidney Ace, MD [facility] Pager [phone]  If 7PM-7AM, please contact night-coverage 05/15/2021,  10:45 AM

## 2021-05-15 NOTE — Plan of Care (Signed)
  Problem: Education: Goal: Knowledge of General Education information will improve Description: Including pain rating scale, medication(s)/side effects and non-pharmacologic comfort measures Outcome: Progressing   Problem: Health Behavior/Discharge Planning: Goal: Ability to manage health-related needs will improve Outcome: Progressing   Problem: Clinical Measurements: Goal: Ability to maintain clinical measurements within normal limits will improve Outcome: Progressing Goal: Will remain free from infection Outcome: Progressing Goal: Diagnostic test results will improve Outcome: Progressing Goal: Respiratory complications will improve Outcome: Progressing Goal: Cardiovascular complication will be avoided Outcome: Progressing   Problem: Activity: Goal: Risk for activity intolerance will decrease Outcome: Progressing   Problem: Nutrition: Goal: Adequate nutrition will be maintained Outcome: Progressing   Problem: Coping: Goal: Level of anxiety will decrease Outcome: Progressing   Problem: Elimination: Goal: Will not experience complications related to bowel motility Outcome: Progressing Goal: Will not experience complications related to urinary retention Outcome: Progressing   Problem: Pain Managment: Goal: General experience of comfort will improve Outcome: Progressing   Problem: Safety: Goal: Ability to remain free from injury will improve Outcome: Progressing   Problem: Skin Integrity: Goal: Risk for impaired skin integrity will decrease Outcome: Progressing   Problem: Education: Goal: Ability to describe self-care measures that may prevent or decrease complications (Diabetes Survival Skills Education) will improve Outcome: Progressing Goal: Individualized Educational Video(s) Outcome: Progressing   Problem: Coping: Goal: Ability to adjust to condition or change in health will improve Outcome: Progressing   Problem: Fluid Volume: Goal: Ability to  maintain a balanced intake and output will improve Outcome: Progressing   Problem: Health Behavior/Discharge Planning: Goal: Ability to identify and utilize available resources and services will improve Outcome: Progressing Goal: Ability to manage health-related needs will improve Outcome: Progressing   Problem: Metabolic: Goal: Ability to maintain appropriate glucose levels will improve Outcome: Progressing   Problem: Nutritional: Goal: Maintenance of adequate nutrition will improve Outcome: Progressing Goal: Progress toward achieving an optimal weight will improve Outcome: Progressing   Problem: Skin Integrity: Goal: Risk for impaired skin integrity will decrease Outcome: Progressing   Problem: Tissue Perfusion: Goal: Adequacy of tissue perfusion will improve Outcome: Progressing   Problem: Urinary Elimination: Goal: Signs and symptoms of infection will decrease Outcome: Progressing   Problem: Urinary Elimination: Goal: Signs and symptoms of infection will decrease Outcome: Progressing

## 2021-05-15 NOTE — Progress Notes (Addendum)
Scant amount of bloody drainage from left elbow splint, left elbow dressing reinforced with additional ABD pads added. Will continue to monitor.   2300: Patient had moderate amount of drainage from LUE dressing, New ABD pads applied and dressing reinforced, MD Hooten notified. Will continue to monitor.   0200: Patients LUE dressing completely saturated with blood, new ABD pads applied. MD Hooten notified and in route. CBC ordered. Will continue to monitor.

## 2021-05-15 NOTE — Progress Notes (Signed)
Physical Therapy Treatment Patient Details Name: Erin Daniels MRN: [id number] DOB: 03-11-1946 Today's Date: 05/15/2021   History of Present Illness Pt is a 75 y.o. female with medical history significant for insulin-dependent diabetes mellitus, history of CVA, diabetic retinopathy, hypertension and hypothyroidism who was brought into the ER by her husband for evaluation following a fall the night prior to her admission. Pt found to have L olecranon process fx (NWB LUE), R 7th rib fx, and unknown age T11 compression fx. Pt s/p T10 kyphoplasty and LUE olecranon ORIF.    PT Comments    OOB with min assist.  She is able to stand and walk to commode, to door  then after seated rest increase gait to a max of 35' with HW and min a x 1.  Wide BOS with very choppy steps but only min a x 1 for safety and balance.  Remains in recliner with needs in reach and met.  Communicated with RN 250 CC urine and small formed BM.   Recommendations for follow up therapy are one component of a multi-disciplinary discharge planning process, led by the attending physician.  Recommendations may be updated based on patient status, additional functional criteria and insurance authorization.  Follow Up Recommendations  SNF     Equipment Recommendations  Other (comment)    Recommendations for Other Services       Precautions / Restrictions Precautions Precautions: Fall Required Braces or Orthoses: Other Brace Other Brace: long arm splint to LUE Restrictions Weight Bearing Restrictions: Yes LUE Weight Bearing: Non weight bearing     Mobility  Bed Mobility Overal bed mobility: Needs Assistance Bed Mobility: Supine to Sit     Supine to sit: Min assist          Transfers Overall transfer level: Needs assistance Equipment used: Hemi-walker Transfers: Sit to/from Stand Sit to Stand: Min assist;Min guard            Ambulation/Gait Ambulation/Gait assistance: Min guard;Min Management consultant (Feet): 35 Feet Assistive device: Hemi-walker Gait Pattern/deviations: Step-through pattern;Decreased step length - right;Decreased step length - left Gait velocity: decreased   General Gait Details: 63' then 58' progressing outside of room to opposite door and back   Stairs             Wheelchair Mobility    Modified Rankin (Stroke Patients Only)       Balance Overall balance assessment: Needs assistance;History of Falls Sitting-balance support: No upper extremity supported;Feet unsupported Sitting balance-Leahy Scale: Fair     Standing balance support: Single extremity supported;During functional activity Standing balance-Leahy Scale: Poor                              Cognition Arousal/Alertness: Awake/alert Behavior During Therapy: WFL for tasks assessed/performed Overall Cognitive Status: Within Functional Limits for tasks assessed                                        Exercises      General Comments        Pertinent Vitals/Pain Pain Assessment: Faces Faces Pain Scale: Hurts a little bit Pain Location: LUE Pain Descriptors / Indicators: Aching;Sore Pain Intervention(s): Monitored during session;Limited activity within patient's tolerance;Repositioned    Home Living  Prior Function            PT Goals (current goals can now be found in the care plan section) Progress towards PT goals: Progressing toward goals    Frequency    BID      PT Plan Current plan remains appropriate    Co-evaluation              AM-PAC PT "6 Clicks" Mobility   Outcome Measure  Help needed turning from your back to your side while in a flat bed without using bedrails?: A Little Help needed moving from lying on your back to sitting on the side of a flat bed without using bedrails?: A Little Help needed moving to and from a bed to a chair (including a wheelchair)?: A Little Help needed  standing up from a chair using your arms (e.g., wheelchair or bedside chair)?: A Little Help needed to walk in hospital room?: A Little Help needed climbing 3-5 steps with a railing? : A Lot 6 Click Score: 17    End of Session Equipment Utilized During Treatment: Gait belt Activity Tolerance: Patient tolerated treatment well;Patient limited by fatigue Patient left: in chair;with call bell/phone within reach;with chair alarm set Nurse Communication: Mobility status;Other (comment) PT Visit Diagnosis: Unsteadiness on feet (R26.81);History of falling (Z91.81);Muscle weakness (generalized) (M62.81);Difficulty in walking, not elsewhere classified (R26.2);Pain Pain - Right/Left: Left Pain - part of body: Arm     Time: 3794-3276 PT Time Calculation (min) (ACUTE ONLY): 25 min  Charges:  $Gait Training: 8-22 mins $Therapeutic Activity: 8-22 mins                    Chesley Noon, PTA 05/15/21, 9:52 AM

## 2021-05-15 NOTE — Progress Notes (Signed)
Noted bloody drainage.  Re-enforced dressing.

## 2021-05-15 NOTE — TOC Progression Note (Signed)
Transition of Care [facility]) - Progression Note    Patient Details  Name: Erin Daniels MRN: [id number] Date of Birth: October 26, 1945  Transition of Care [facility]) CM/SW Contact  Su Hilt, RN Phone Number: 05/15/2021, 10:51 AM  Clinical Narrative:   Colletta Maryland with [facility] called and stated theta they are needing the facility where the patient plans to go, I reached out to Konterra at [facility] asking if she will review for a bed offer as this is the one the patient prefers, Will call Colletta Maryland with [facility]th back at [phone] once bed offer is confirmed and choice is made by patient.         Expected Discharge Plan and Services                                                 Social Determinants of Health (SDOH) Interventions    Readmission Risk Interventions Readmission Risk Prevention Plan 12/07/2018  Transportation Screening Complete  HRI or [place] Complete  Social Work Consult for [place] Planning/Counseling Complete  Medication Review Press photographer) Complete  Some recent data might be hidden

## 2021-05-15 NOTE — Progress Notes (Signed)
Charge nurse notified this nurse of left elbow dressing with blood.  CN unwrapped and splint came off and noted blood on dressing.  MD notified.

## 2021-05-15 NOTE — Progress Notes (Signed)
Orthopaedics: Notified by nursing of bloody drainage through the dressing. Nurse removed the splint and outer dressing.  I removed the deep dressing. Old bloody drainage had saturated the dressing. Incision was well-approximated with staples with no erythema. Scant amount of bloody drainage. New dressing applied followed by application of a new posterior splint.  Joetta Delprado P. Holley Bouche M.D.

## 2021-05-15 NOTE — Progress Notes (Signed)
Subjective: 3 Days Post-Op Procedure(s) (LRB): OPEN REDUCTION INTERNAL FIXATION (ORIF) ELBOW/OLECRANON FRACTURE (Left) KYPHOPLASTY - T11 (N/A) Pain in the back has improved. Does have pain and swelling in the left arm splint which has improved since yesterday. Denies any N/T to the left arm. Patient is passing gas, denies any N/V. Denies any chest pain this AM.  Objective: Vital signs in last 24 hours: Temp:  [98.4 F (36.9 C)-98.7 F (37.1 C)] 98.4 F (36.9 C) (09/25 0800) Pulse Rate:  [76-85] 79 (09/25 0800) Resp:  [15-18] 15 (09/25 0800) BP: (139-178)/(56-78) 172/75 (09/25 0800) SpO2:  [95 %-98 %] 97 % (09/25 0800)  Intake/Output from previous day: 09/24 0701 - 09/25 0700 In: 363 [P.O.:360; I.V.:3] Out: 1500 [Urine:1500] Intake/Output this shift: No intake/output data recorded.  No results for input(s): HGB in the last 72 hours. No results for input(s): WBC, RBC, HCT, PLT in the last 72 hours. Recent Labs    05/13/21 0420  NA 128*  K 4.7  CL 96*  CO2 24  BUN 14  CREATININE 0.59  GLUCOSE 243*  CALCIUM 7.6*   No results for input(s): LABPT, INR in the last 72 hours.  EXAM General - Patient is Alert, Appropriate, and Oriented Left upper extremity - Neurovascular intact Sensation intact distally Intact pulses distally Posterior splint intact, no drainage.  Swelling improved to the left hand. Wrinkles beginning to appear again in the fingers. Cap refill intact to each digit to the left hand. Thoracolumbar spine -no spinous process tenderness.  Mild paravertebral muscle tenderness.  Incision site intact Motor Function - intact, moving foot and toes well on exam.   Past Medical History:  Diagnosis Date   Angina pectoris (HCC)    At high risk for falls    Cancer [facility])    Carpal tunnel syndrome    BILATERAL   Cervicalgia    Complication of anesthesia    difficult to arouse   Diabetes mellitus without complication (HCC)    Type I   Diabetic neuropathy (HCC)     Diabetic neuropathy (HCC)    Diabetic retinopathy ([place])    Diabetic retinopathy ([place])    Dizziness    Gastroparesis    Headache    MIGRAINE   Herniated disc, cervical    Hypercholesteremia    Hypertension    Hypokalemia    Hypotensive episode    Hypothyroidism    Insulin pump in place    Myofascial muscle pain    Numbness of arm    left   Osteoporosis    Poor vision    Rosacea    Seizures (HCC)    Slurred speech    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   Stroke ([place])    x5   Vertigo    Wears glasses     Assessment/Plan:   3 Days Post-Op Procedure(s) (LRB): OPEN REDUCTION INTERNAL FIXATION (ORIF) ELBOW/OLECRANON FRACTURE (Left) KYPHOPLASTY - T11 (N/A) Principal Problem:   Fall Active Problems:   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   Weight as of this encounter: 53.5 kg. Advance diet Up with therapy  Activities as tolerated in the splint.  Non-weightbearing to the left arm. Keep the left arm splint clean.  Swelling improving to the left hand. Keep left arm elevated above heart level. Follow-up with [facility] orthopaedics in 1 weeks for dressing change and skin check.  DVT Prophylaxis -  Plavix  Raquel Lexani Corona, PA-C [place] 05/15/2021, 8:49 AM

## 2021-05-15 NOTE — TOC Progression Note (Signed)
Transition of Care [facility]) - Progression Note    Patient Details  Name: Erin Daniels MRN: [id number] Date of Birth: May 06, 1946  Transition of Care [facility]) CM/SW [place], RN Phone Number: 05/15/2021, 12:16 PM  Clinical Narrative:     Met with the patient and her husband to discuss Bed offer, They accepted the offer from [facility], The patient has had 2 covid vaccines and 1 booster, She does not want the 2nd booster, she understands that she will likely be quarantined as a result of not getting 2nd booster She did have the flu vaccine this admission   I called Colletta [place] at [facility] to update on the facility, Josem Kaufmann is pending    Expected Discharge Plan and Services                                                 Social Determinants of Health (SDOH) Interventions    Readmission Risk Interventions Readmission Risk Prevention Plan 12/07/2018  Transportation Screening Complete  HRI or Home Care Consult Complete  Social Work Consult for [place] Planning/Counseling Complete  Medication Review [employer]) Complete  Some recent data might be hidden

## 2021-05-16 ENCOUNTER — Observation Stay
Admission: EM | Admit: 2021-05-16 | Discharge: 2021-05-18 | Disposition: A | Discharge status: Home / Self Care | Payer: Medicare Other | Attending: Internal Medicine | Admitting: Internal Medicine

## 2021-05-16 DIAGNOSIS — Z794 Long term (current) use of insulin: Secondary | ICD-10-CM | POA: Insufficient documentation

## 2021-05-16 DIAGNOSIS — D649 Anemia, unspecified: Secondary | ICD-10-CM | POA: Diagnosis not present

## 2021-05-16 DIAGNOSIS — Z7902 Long term (current) use of antithrombotics/antiplatelets: Secondary | ICD-10-CM | POA: Insufficient documentation

## 2021-05-16 DIAGNOSIS — M8448XA Pathological fracture, other site, initial encounter for fracture: Secondary | ICD-10-CM | POA: Diagnosis present

## 2021-05-16 DIAGNOSIS — E039 Hypothyroidism, unspecified: Secondary | ICD-10-CM | POA: Diagnosis present

## 2021-05-16 DIAGNOSIS — S066XAA Traumatic subarachnoid hemorrhage with loss of consciousness status unknown, initial encounter: Secondary | ICD-10-CM | POA: Diagnosis present

## 2021-05-16 DIAGNOSIS — Z79899 Other long term (current) drug therapy: Secondary | ICD-10-CM | POA: Insufficient documentation

## 2021-05-16 DIAGNOSIS — G43709 Chronic migraine without aura, not intractable, without status migrainosus: Secondary | ICD-10-CM | POA: Diagnosis present

## 2021-05-16 DIAGNOSIS — I10 Essential (primary) hypertension: Secondary | ICD-10-CM | POA: Diagnosis present

## 2021-05-16 DIAGNOSIS — R710 Precipitous drop in hematocrit: Secondary | ICD-10-CM

## 2021-05-16 DIAGNOSIS — D638 Anemia in other chronic diseases classified elsewhere: Secondary | ICD-10-CM | POA: Diagnosis present

## 2021-05-16 DIAGNOSIS — I6930 Unspecified sequelae of cerebral infarction: Secondary | ICD-10-CM

## 2021-05-16 DIAGNOSIS — Z9889 Other specified postprocedural states: Secondary | ICD-10-CM

## 2021-05-16 DIAGNOSIS — Z8673 Personal history of transient ischemic attack (TIA), and cerebral infarction without residual deficits: Secondary | ICD-10-CM

## 2021-05-16 DIAGNOSIS — E785 Hyperlipidemia, unspecified: Secondary | ICD-10-CM | POA: Diagnosis present

## 2021-05-16 DIAGNOSIS — Z7982 Long term (current) use of aspirin: Secondary | ICD-10-CM | POA: Insufficient documentation

## 2021-05-16 DIAGNOSIS — D75839 Thrombocytosis, unspecified: Secondary | ICD-10-CM | POA: Diagnosis present

## 2021-05-16 DIAGNOSIS — K5900 Constipation, unspecified: Secondary | ICD-10-CM | POA: Diagnosis present

## 2021-05-16 DIAGNOSIS — S066X9A Traumatic subarachnoid hemorrhage with loss of consciousness of unspecified duration, initial encounter: Secondary | ICD-10-CM | POA: Diagnosis present

## 2021-05-16 DIAGNOSIS — G894 Chronic pain syndrome: Secondary | ICD-10-CM | POA: Diagnosis present

## 2021-05-16 DIAGNOSIS — Z96641 Presence of right artificial hip joint: Secondary | ICD-10-CM | POA: Insufficient documentation

## 2021-05-16 DIAGNOSIS — E109 Type 1 diabetes mellitus without complications: Secondary | ICD-10-CM | POA: Insufficient documentation

## 2021-05-16 DIAGNOSIS — F32A Depression, unspecified: Secondary | ICD-10-CM | POA: Diagnosis present

## 2021-05-16 DIAGNOSIS — Z96612 Presence of left artificial shoulder joint: Secondary | ICD-10-CM | POA: Insufficient documentation

## 2021-05-16 LAB — GLUCOSE, CAPILLARY
Glucose-Capillary: 213 mg/dL — ABNORMAL HIGH (ref 70–99)
Glucose-Capillary: 242 mg/dL — ABNORMAL HIGH (ref 70–99)

## 2021-05-16 LAB — CBC WITH DIFFERENTIAL/PLATELET
Abs Immature Granulocytes: 0.07 10*3/uL (ref 0.00–0.07)
Basophils Absolute: 0.1 10*3/uL (ref 0.0–0.1)
Basophils Relative: 1 %
Eosinophils Absolute: 0.2 10*3/uL (ref 0.0–0.5)
Eosinophils Relative: 3 %
HCT: 20.3 % — ABNORMAL LOW (ref 36.0–46.0)
Hemoglobin: 7.1 g/dL — ABNORMAL LOW (ref 12.0–15.0)
Immature Granulocytes: 1 %
Lymphocytes Relative: 13 %
Lymphs Abs: 1.2 10*3/uL (ref 0.7–4.0)
MCH: 32 pg (ref 26.0–34.0)
MCHC: 35 g/dL (ref 30.0–36.0)
MCV: 91.4 fL (ref 80.0–100.0)
Monocytes Absolute: 0.9 10*3/uL (ref 0.1–1.0)
Monocytes Relative: 10 %
Neutro Abs: 6.6 10*3/uL (ref 1.7–7.7)
Neutrophils Relative %: 72 %
Platelets: 425 10*3/uL — ABNORMAL HIGH (ref 150–400)
RBC: 2.22 MIL/uL — ABNORMAL LOW (ref 3.87–5.11)
RDW: 12.1 % (ref 11.5–15.5)
WBC: 9 10*3/uL (ref 4.0–10.5)
nRBC: 0 % (ref 0.0–0.2)

## 2021-05-16 LAB — BASIC METABOLIC PANEL
Anion gap: 8 (ref 5–15)
BUN: 19 mg/dL (ref 8–23)
CO2: 24 mmol/L (ref 22–32)
Calcium: 8.3 mg/dL — ABNORMAL LOW (ref 8.9–10.3)
Chloride: 94 mmol/L — ABNORMAL LOW (ref 98–111)
Creatinine, Ser: 0.6 mg/dL (ref 0.44–1.00)
GFR, Estimated: >60 mL/min (ref >60)
Glucose, Bld: 200 mg/dL — ABNORMAL HIGH (ref 70–99)
Potassium: 4.7 mmol/L (ref 3.5–5.1)
Sodium: 126 mmol/L — ABNORMAL LOW (ref 135–145)

## 2021-05-16 LAB — CBC
HCT: 22.7 % — ABNORMAL LOW (ref 36.0–46.0)
Hemoglobin: 8 g/dL — ABNORMAL LOW (ref 12.0–15.0)
MCH: 32.3 pg (ref 26.0–34.0)
MCHC: 35.2 g/dL (ref 30.0–36.0)
MCV: 91.5 fL (ref 80.0–100.0)
Platelets: 361 10*3/uL (ref 150–400)
RBC: 2.48 MIL/uL — ABNORMAL LOW (ref 3.87–5.11)
RDW: 11.9 % (ref 11.5–15.5)
WBC: 7.7 10*3/uL (ref 4.0–10.5)
nRBC: 0 % (ref 0.0–0.2)

## 2021-05-16 LAB — APTT: aPTT: 31 seconds (ref 24–36)

## 2021-05-16 LAB — PROTIME-INR
INR: 1 (ref 0.8–1.2)
Prothrombin Time: 13.5 seconds (ref 11.4–15.2)

## 2021-05-16 LAB — SURGICAL PATHOLOGY

## 2021-05-16 LAB — RESP PANEL BY RT-PCR (FLU A&B, COVID) ARPGX2
Influenza A by PCR: NEGATIVE
Influenza B by PCR: NEGATIVE
SARS Coronavirus 2 by RT PCR: NEGATIVE

## 2021-05-16 MED ORDER — BUTALBITAL-APAP-CAFFEINE 50-325-40 MG PO TABS: 1.0000 | ORAL_TABLET | Freq: Two times a day (BID) | ORAL | Status: DC | PRN

## 2021-05-16 MED ORDER — ACETAMINOPHEN 650 MG RE SUPP: 650.0000 mg | Freq: Four times a day (QID) | RECTAL | Status: DC | PRN

## 2021-05-16 MED ORDER — ATORVASTATIN CALCIUM 20 MG PO TABS
40.0000 mg | ORAL_TABLET | Freq: Every day | ORAL | Status: DC
  Administered 2021-05-17 – 2021-05-18 (×2): 40 mg via ORAL
  Filled 2021-05-16 (×2): qty 2

## 2021-05-16 MED ORDER — TAMSULOSIN HCL 0.4 MG PO CAPS: 0.4000 mg | ORAL_CAPSULE | Freq: Every day | ORAL | Status: DC

## 2021-05-16 MED ORDER — SODIUM CHLORIDE 0.9 % IV SOLN
10.0000 mL/h | Freq: Once | INTRAVENOUS | Status: AC
  Administered 2021-05-17: 10 mL/h via INTRAVENOUS

## 2021-05-16 MED ORDER — BUTALBITAL-APAP-CAFFEINE 50-325-40 MG PO TABS: 1.0000 | ORAL_TABLET | Freq: Two times a day (BID) | ORAL | 0 refills | Status: AC | PRN

## 2021-05-16 MED ORDER — OXYCODONE HCL 5 MG PO TABS: 5.0000 mg | ORAL_TABLET | ORAL | 0 refills | Status: AC | PRN

## 2021-05-16 MED ORDER — LOSARTAN POTASSIUM 100 MG PO TABS: 100.0000 mg | ORAL_TABLET | Freq: Every day | ORAL | Status: DC

## 2021-05-16 MED ORDER — LOSARTAN POTASSIUM 50 MG PO TABS
100.0000 mg | ORAL_TABLET | Freq: Every day | ORAL | Status: DC
  Administered 2021-05-17 – 2021-05-18 (×2): 100 mg via ORAL
  Filled 2021-05-16 (×2): qty 2

## 2021-05-16 MED ORDER — ONDANSETRON HCL 4 MG PO TABS
4.0000 mg | ORAL_TABLET | Freq: Four times a day (QID) | ORAL | Status: DC | PRN
Start: 2021-05-16 — End: 2021-05-18

## 2021-05-16 MED ORDER — ACETAMINOPHEN 325 MG PO TABS
650.0000 mg | ORAL_TABLET | Freq: Four times a day (QID) | ORAL | Status: DC | PRN
  Administered 2021-05-17: 650 mg via ORAL
  Filled 2021-05-16: qty 2

## 2021-05-16 MED ORDER — VENLAFAXINE HCL 37.5 MG PO TABS
75.0000 mg | ORAL_TABLET | Freq: Two times a day (BID) | ORAL | Status: DC
  Administered 2021-05-17 – 2021-05-18 (×4): 75 mg via ORAL
  Filled 2021-05-16 (×5): qty 2

## 2021-05-16 MED ORDER — ONDANSETRON HCL 4 MG/2ML IJ SOLN: 4.0000 mg | Freq: Four times a day (QID) | INTRAMUSCULAR | Status: DC | PRN

## 2021-05-16 NOTE — ED Provider Notes (Signed)
[facility] Emergency Department Provider Note  ____________________________________________   Event Date/Time   First MD Initiated Contact with Patient 05/16/21 1949     (approximate)  I have reviewed the triage vital signs and the nursing notes.   HISTORY  Chief Complaint Post-op Follow-up    HPI Erin Daniels is a 75 y.o. female with diabetes, recent surgery on her left elbow and comes in with concern for postop bleed.  Patient reports being on Plavix.  Patient has had some drainage out of the left elbow that is being constant, nothing makes it better including compression wraps, nothing makes it worse, mild for the past 3 days.  They have been changing dressings without any stop and the bleeding.  She reports a little bit of weakness as well.     Past Medical History:  Diagnosis Date   Angina pectoris ([place])    At high risk for falls    Cancer [facility])    Carpal tunnel syndrome    BILATERAL   Cervicalgia    Complication of anesthesia    difficult to arouse   Diabetes mellitus without complication (HCC)    Type I   Diabetic neuropathy (HCC)    Diabetic neuropathy ([place])    Diabetic retinopathy ([place])    Diabetic retinopathy ([place])    Dizziness    Gastroparesis    Headache    MIGRAINE   Herniated disc, cervical    Hypercholesteremia    Hypertension    Hypokalemia    Hypotensive episode    Hypothyroidism    Insulin pump in place    Myofascial muscle pain    Numbness of arm    left   Osteoporosis    Poor vision    Rosacea    Seizures (HCC)    Slurred speech    Stroke (cerebrum) ([place])    4/19   Stroke ([place])    x5   Vertigo    Wears glasses     Patient Active Problem List   Diagnosis Date Noted   Migraine headache 05/08/2021   Olecranon fracture 05/08/2021   Fall 05/07/2021   Hypothyroidism    Olecranon fracture, left, closed, initial encounter    Fracture of one rib, right side, initial encounter for closed fracture     Pressure injury of skin 14/48/1856   Acute metabolic encephalopathy 31/49/7026   HLD (hyperlipidemia) 08/11/2019   Stroke ([place]) 08/11/2019   Iron deficiency anemia 08/11/2019   Subarachnoid hematoma ([place]) 04/30/2019   Intertrochanteric fracture of right hip ([place]) 12/08/2018   Hypercoagulopathy ([place]) 09/29/2018   Acute anxiety 09/29/2018   Hypertension associated with type 1 diabetes mellitus ([place]) 09/19/2018   Dyslipidemia due to type 1 diabetes mellitus ([place]) 09/19/2018   Anemia of chronic disease 09/19/2018   Chronic migraine without aura 09/19/2018   Chronic non-seasonal allergic rhinitis 09/19/2018   Major depression, recurrent, chronic ([place]) 09/19/2018   Diabetic gastroparesis associated with type 1 diabetes mellitus ([place]) 09/19/2018   Bilateral sacral insufficiency fracture 09/19/2018   Constipation 09/15/2018   Acute urinary retention 09/15/2018   Intractable pain 09/15/2018   Chronic cerebrovascular accident (CVA) 05/28/2018   Diabetic polyneuropathy associated with type 1 diabetes mellitus ([place]) 12/10/2017   Numbness and tingling of both legs 12/10/2017   Chronic pain syndrome 12/10/2017   DKA (diabetic ketoacidoses) 11/12/2017   Confusion 11/11/2017   Hypoglycemia 11/26/2016   Lactic acid acidosis    Sepsis (HCC)    Type 1 diabetes mellitus with proliferative  retinopathy of both eyes ([place])    Essential hypertension    Depression    Right radial fracture 05/29/2015    Past Surgical History:  Procedure Laterality Date   APPENDECTOMY     AUGMENTATION MAMMAPLASTY Bilateral 2011   BILATERAL CARPAL TUNNEL RELEASE     BITRECTOMY     MEMBRAIN PEEL EYE   COLONOSCOPY     COLONOSCOPY WITH PROPOFOL N/A 03/17/2020   Procedure: COLONOSCOPY WITH PROPOFOL;  Surgeon: Toledo, Benay Pike, MD;  Location: [facility] ENDOSCOPY;  Service: Gastroenterology;  Laterality: N/A;   EYE SURGERY Bilateral    cataract   FRACTURE SURGERY     HEMORROIDECTOMY     HERNIA REPAIR     umbilical    INTRAMEDULLARY (IM) NAIL INTERTROCHANTERIC Right 12/07/2018   Procedure: INTRAMEDULLARY (IM) NAIL INTERTROCHANTRIC;  Surgeon: Thornton Park, MD;  Location: [facility] ORS;  Service: Orthopedics;  Laterality: Right;   JOINT REPLACEMENT     right hip   KYPHOPLASTY N/A 05/12/2021   Procedure: KYPHOPLASTY - T11;  Surgeon: Hessie Knows, MD;  Location: [facility] ORS;  Service: Orthopedics;  Laterality: N/A;   OPEN REDUCTION INTERNAL FIXATION (ORIF) DISTAL RADIAL FRACTURE Right 05/29/2015   Procedure: OPEN REDUCTION INTERNAL FIXATION (ORIF) RIGHT DISTAL RADIUS FRACTURE WITH ALLOGRAFT BONE GRAFTING FOR REPAIR AND RECONSTRUCTION;  Surgeon: Roseanne Kaufman, MD;  Location: [place];  Service: Orthopedics;  Laterality: Right;   OPEN REDUCTION INTERNAL FIXATION (ORIF) METACARPAL Right 01/09/2019   Procedure: OPEN REDUCTION INTERNAL FIXATION (ORIF) RIGHT SECOND METACARPAL FRACTURE;  Surgeon: Corky Mull, MD;  Location: [facility] ORS;  Service: Orthopedics;  Laterality: Right;   ORIF ELBOW FRACTURE Left 05/12/2021   Procedure: OPEN REDUCTION INTERNAL FIXATION (ORIF) ELBOW/OLECRANON FRACTURE;  Surgeon: Hessie Knows, MD;  Location: [facility] ORS;  Service: Orthopedics;  Laterality: Left;   SHOULDER ARTHROSCOPY Left    SHOULDER HEMI-ARTHROPLASTY Left    SYNOVECTOMY     TONSILLECTOMY     TRIGGER FINGER RELEASE     TUBAL LIGATION      Prior to Admission medications   Medication Sig Start Date End Date Taking? Authorizing Provider  acetaminophen (TYLENOL) 325 MG tablet Take 2 tablets (650 mg total) by mouth every 6 (six) hours as needed for headache. For mild pain 09/18/18   Saundra Shelling, MD  aspirin EC 81 MG tablet Take 81 mg by mouth daily.    [provider]  atorvastatin (LIPITOR) 40 MG tablet Take 40 mg by mouth daily. 04/13/21   [provider]  brimonidine-timolol (COMBIGAN) 0.2-0.5 % ophthalmic solution Place 1 drop into the right eye 3 (three) times daily. Instill one drop in right eye morning, noon and  bedtime     [provider]  butalbital-acetaminophen-caffeine (FIORICET) 50-325-40 MG tablet Take 1 tablet by mouth 2 (two) times daily as needed for up to 3 days for headache or migraine. SNF use onlu 05/16/21 05/19/21  Ralene Muskrat B, MD  Calcium Carb-Cholecalciferol 600-200 MG-UNIT TABS Take 1 tablet by mouth daily with supper.     [provider]  cetirizine (ZYRTEC) 10 MG tablet Take 1 tablet (10 mg total) by mouth daily. 10/10/18   Medina-Vargas, Monina C, NP  Cholecalciferol (VITAMIN D-3) 5000 units TABS Take 5,000 Units by mouth daily.    [provider]  clopidogrel (PLAVIX) 75 MG tablet Take 75 mg by mouth daily with supper.    [provider]  ferrous sulfate 325 (65 FE) MG tablet Take 325 mg by mouth daily with supper.  [provider]  gabapentin (NEURONTIN) 300 MG capsule 300 mg in the morning, 300 mg in the afternoon, 900 mg in the evening Patient taking differently: Take 300-900 mg by mouth See admin instructions. Take 1 capsule (300mg ) by mouth every morning, 1 capsule (300mg ) by mouth at lunch and take 3 capsules (900mg ) by mouth every night at bedtime 10/10/18   Medina-Vargas, Monina C, NP  GLUCAGON EMERGENCY 1 MG injection Inject 1 mg into the muscle once. 01/24/19   [provider]  glucose 4 GM chewable tablet Chew 1 tablet (4 g total) by mouth as needed for low blood sugar. <65 10/10/18   Medina-Vargas, Monina C, NP  insulin aspart (NOVOLOG) 100 UNIT/ML injection Inject into the skin continuous. (insulin pump averages about 20u over 24 hours)    [provider]  Insulin Disposable Pump (OMNIPOD CLASSIC PODS, GEN 3,) MISC Inject 1 Piece into the skin every 3 (three) days. 04/18/21   [provider]  losartan (COZAAR) 100 MG tablet Take 1 tablet (100 mg total) by mouth daily. 05/16/21   Sidney Ace, MD  Multiple Vitamin (MULTIVITAMIN WITH MINERALS) TABS tablet Take 1 tablet by mouth daily.    [provider]  Netarsudil-Latanoprost 0.02-0.005 % SOLN Place 1 drop into the right eye at bedtime.     [provider]  oxyCODONE (OXY IR/ROXICODONE) 5 MG immediate release tablet Take 1 tablet (5 mg total) by mouth every 4 (four) hours as needed for up to 3 days for moderate pain. SNF use only 05/16/21 05/19/21  Sreenath, Sudheer B, MD  polyethylene glycol (MIRALAX / GLYCOLAX) packet Take 17 g by mouth every other day.     [provider]  promethazine (PHENERGAN) 25 MG suppository Place 25 mg rectally every 6 (six) hours as needed for nausea or vomiting.    [provider]  tamsulosin (FLOMAX) 0.4 MG CAPS capsule Take 1 capsule (0.4 mg total) by mouth daily. 05/17/21   Sidney Ace, MD  venlafaxine (EFFEXOR) 75 MG tablet Take 1 tablet (75 mg total) by mouth 2 (two) times daily. 10/10/18   Medina-Vargas, Monina C, NP  vitamin C (ASCORBIC ACID) 500 MG tablet Take 500 mg by mouth 2 (two) times daily.     [provider]  VYZULTA 0.024 % SOLN Place 1 drop into the right eye daily. 02/05/21   [provider]    Allergies Carbocaine [mepivacaine hcl], Hydrocodone, Tussionex pennkinetic er [hydrocod polst-cpm polst er], Baclofen, Codeine, Meperidine, Metoclopramide hcl, and Stadol [butorphanol]  Family History  Problem Relation Age of Onset   Hypertension Mother    Stroke Mother    Heart failure Father     Social History Social History   Tobacco Use   Smoking status: Never   Smokeless tobacco: Never  Vaping Use   Vaping Use: Never used  Substance Use Topics   Alcohol use: No   Drug use: No      Review of Systems Constitutional: No fever/chills Eyes: No visual changes. ENT: No sore throat. Cardiovascular: Denies chest pain. Respiratory: Denies shortness of breath. Gastrointestinal: No abdominal pain.  No nausea, no vomiting.  No diarrhea.  No constipation. Genitourinary: Negative for dysuria. Musculoskeletal: Negative for back  pain.  Left arm bleed Skin: Negative for rash. Neurological: Negative for headaches, focal weakness or numbness. All other ROS negative ____________________________________________   PHYSICAL EXAM:  VITAL SIGNS: ED Triage Vitals [05/16/21 1946]  Enc Vitals Group     BP (!) 132/52  Pulse Rate 97     Resp 17     Temp 98.5 F (36.9 C)     Temp Source Oral     SpO2 98 %     Weight      Height      Head Circumference      Peak Flow      Pain Score      Pain Loc      Pain Edu?      Excl. in [place]?     Constitutional: Alert and oriented. Well appearing and in no acute distress. Eyes: Conjunctivae are normal. EOMI. Head: Atraumatic. Nose: No congestion/rhinnorhea. Mouth/Throat: Mucous membranes are moist.   Neck: No stridor. Trachea Midline. FROM Cardiovascular: Normal rate, regular rhythm. Grossly normal heart sounds.  Good peripheral circulation. Respiratory: Normal respiratory effort.  No retractions. Lungs CTAB. Gastrointestinal: Soft and nontender. No distention. No abdominal bruits.  Musculoskeletal: Dressing was taken down.  Patient has staples across the left elbow.  Clot was removed underneath the staples and in the middle of the wound there was a small pulsatile like bleed coming up from underneath one of the staples.  Unable to actually see the vessel itself.  2+ distal pulse Neurologic:  Normal speech and language. No gross focal neurologic deficits are appreciated.  Skin:  Skin is warm, dry and intact. No rash noted. Psychiatric: Mood and affect are normal. Speech and behavior are normal. GU: Deferred   ____________________________________________   LABS (all labs ordered are listed, but only abnormal results are displayed)  Labs Reviewed  CBC WITH DIFFERENTIAL/PLATELET - Abnormal; Notable for the following components:      Result Value   RBC 2.22 (*)    Hemoglobin 7.1 (*)    HCT 20.3 (*)    Platelets 425 (*)    All other components within normal limits   BASIC METABOLIC PANEL - Abnormal; Notable for the following components:   Sodium 126 (*)    Chloride 94 (*)    Glucose, Bld 200 (*)    Calcium 8.3 (*)    All other components within normal limits  RESP PANEL BY RT-PCR (FLU A&B, COVID) ARPGX2  PROTIME-INR  APTT  PREPARE RBC (CROSSMATCH)  TYPE AND SCREEN   ____________________________________________  PROCEDURES  Procedure(s) performed (including Critical Care):  .Critical Care Performed by: Vanessa Matinecock, MD Authorized by: Vanessa Narcissa, MD   Critical care provider statement:    Critical care time (minutes):  35   Critical care was necessary to treat or prevent imminent or life-threatening deterioration of the following conditions: anemia.   Critical care was time spent personally by me on the following activities:  Discussions with consultants, evaluation of patient's response to treatment, examination of patient, ordering and performing treatments and interventions, ordering and review of laboratory studies, ordering and review of radiographic studies, pulse oximetry, re-evaluation of patient's condition, obtaining history from patient or surrogate and review of old charts   ____________________________________________   INITIAL IMPRESSION / [place] / ED COURSE  Erin Daniels was evaluated in Emergency Department on 05/16/2021 for the symptoms described in the history of present illness. She was evaluated in the context of the global COVID-19 pandemic, which necessitated consideration that the patient might be at risk for infection with the SARS-CoV-2 virus that causes COVID-19. Institutional protocols and algorithms that pertain to the evaluation of patients at risk for COVID-19 are in a state of rapid change based on information released by regulatory bodies  including the CDC and federal and state organizations. These policies and algorithms were followed during the patient's care in the ED.    Patient comes in  with continuous bleeding from her left elbow.  After removing the clot it does appear like there is a very small pulsatile bleed underneath one of the staples.  I placed combat gauze on it and use a compression bandage.  I discussed the case with Dr. Roland Rack from ortho.  Recommends admission to medicine for n.p.o. at midnight for possible exploration tomorrow.  Labs are ordered to make sure no evidence of anemia.  Patient's hemoglobin had dropped down to 7 and given she is symptomatic with lightheadedness and with continued bleeding I did consent patient for 1 unit of blood.  I do not feel like CTA is necessary given patient does have good distal pulse.  She denies any other concerns at this time.  Will admit to the hospital team       ____________________________________________   FINAL CLINICAL IMPRESSION(S) / ED DIAGNOSES   Final diagnoses:  Post-operative hemoglobin drop  Symptomatic anemia      MEDICATIONS GIVEN DURING THIS VISIT:  Medications  0.9 %  sodium chloride infusion (has no administration in time range)  butalbital-acetaminophen-caffeine (FIORICET) 50-325-40 MG per tablet 1 tablet (has no administration in time range)  atorvastatin (LIPITOR) tablet 40 mg (has no administration in time range)  losartan (COZAAR) tablet 100 mg (has no administration in time range)  venlafaxine (EFFEXOR) tablet 75 mg (has no administration in time range)  acetaminophen (TYLENOL) tablet 650 mg (has no administration in time range)    Or  acetaminophen (TYLENOL) suppository 650 mg (has no administration in time range)  ondansetron (ZOFRAN) tablet 4 mg (has no administration in time range)    Or  ondansetron (ZOFRAN) injection 4 mg (has no administration in time range)     ED Discharge Orders     None        Note:  This document was prepared using Dragon voice recognition software and may include unintentional dictation errors.    Vanessa [place], MD 05/16/21 2125

## 2021-05-16 NOTE — Discharge Summary (Signed)
Physician Discharge Summary  Erin Daniels XLK:[id number] DOB: 03/13/1946 DOA: 05/07/2021  PCP: Juluis Pitch, MD  Admit date: 05/07/2021 Discharge date: 05/16/2021  Admitted From: Home Disposition: SNF  Recommendations for Outpatient Follow-up:  Follow up with PCP in 1-2 weeks Follow-up with orthopedics 1 week  Home Health: No Equipment/Devices: None  Discharge Condition: Stable CODE STATUS: Full Diet recommendation: Carb modified   brief/Interim Summary: 75 y.o. female with medical history significant for insulin-dependent diabetes mellitus, history of CVA, diabetic retinopathy, hypertension and hypothyroidism who was brought into the ER by her husband for evaluation following a fall the night prior to her admission. Patient was started on antibiotic therapy 2 days prior to this hospitalization for UTI and was also placed on tramadol for pain control.  At baseline she usually ambulates with a rolling walker and according to the husband she had gone to use the bathroom and on her way out forgot to take her walker and fell landing on her left elbow.  He was able to assist her up but noticed significant swelling involving the left elbow and then patient started complaining of pain in the right lateral chest wall.  She rated her pain a 6 to 7 x 10 in intensity at its worst and left elbow pain is worse with any form of movement activity. She complains of urinary frequency.   Orthopedics on consult with plan for ORIF of left olecranon and kyphoplasty at T11.  Patient successfully underwent left upper extremity ORIF and T11 kyphoplasty on 9/22.  Tolerated procedure well with no immediate complications.  Postoperative day 1 her main complaint is left elbow pain.  She endorses pain in her back is improved.   Has been able to ambulate with physical therapy.  Recommendation for skilled nursing facility.  Also postoperative urinary retention improved.  No indication for Foley replacement.  On  day of discharge patient had some mild bloody drainage from the left upper extremity wound.  Seen by orthopedics.  Wound redressed.  Toradol held.  Stable at time of discharge.  Pain improving.  Will discharge to skilled nursing facility.  Follow-up orthopedics 1 week.   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  Antiplatelet therapy resumed.  As needed pain control   Delirium 2/2 pain, narcotics - delirium precautions - minimize opioids to the extent we can - gabapentin dose decreased    Right axillary lymphadenopathy Read says concerning for malignancy. 11/2020 mammo birads 1 - will need outpt f/u   Urinary retention Persistent PVRs >300, has required I/O cath multiple times -Foley was discontinued 9/23 -Patient has been retaining -Required straight cath x2 -Continue Flomax 0.4 mg daily -Puts out approximately 200 to 250 cc of urine at a time -Ambulate and exert as tolerated.  Defer Foley replacement for now -Discharged on Flomax 0.4 mg daily   Right 7th rib fracture T10 compression fracture Ortho following T11 kyphoplasty done 9/22 Plavix restarted Ensure pain control     T1DM On insulin pump at home.  This has been resumed at discharge Bacteriuria Abnormal ua on 9/15 at pcp's  office. Pt denies uti symptoms. Given ceftriaxone here, now discontinued. Urine culture negative   HTN Here bp elevated Continue losartan 100 mg daily.  Previous home dose of 50 mg daily.  I prescribed increased dose on discharge   Chronic pain -  Home  gabapentin and Effexor   Hx CVA - cont home statin Dual antiplatelet therapy resumed  Discharge Instructions  Discharge Instructions     Diet - low sodium heart healthy   Complete by: As directed    Increase activity slowly   Complete by: As directed    No wound care   Complete by: As directed       Allergies as of 05/16/2021       Reactions   Carbocaine [mepivacaine Hcl] Palpitations   Low bp   Hydrocodone Other (See Comments)   Diabetic/  High sugars   Tussionex Pennkinetic Er [hydrocod Polst-cpm Polst Er] Other (See Comments)   Diabetic/  High sugars   Baclofen    confusion   Codeine Nausea Only, Other (See Comments)   Reaction: stress attacks/ headache and nausea Can take Vicodin and Percocet    Meperidine Nausea Only, Other (See Comments)   Reaction: stress attacks/ headache and nausea   Metoclopramide Hcl Nausea Only, Other (See Comments)   Reaction: stress attacks/ headache    Stadol [butorphanol] Nausea Only, Other (See Comments)   Reaction: stress attacks/ headache        Medication List     STOP taking these medications    amoxicillin-clavulanate 875-125 MG tablet Commonly known as: AUGMENTIN   carboxymethylcellulose 0.5 % Soln Commonly known as: REFRESH PLUS   CEROVITE ADVANCED FORMULA PO   senna 8.6 MG tablet Commonly known as: SENOKOT   Systane Ultra 0.4-0.3 % Soln Generic drug: Polyethyl Glycol-Propyl Glycol   traMADol 50 MG tablet Commonly known as: ULTRAM       TAKE these medications    acetaminophen 325 MG tablet Commonly known as: TYLENOL Take 2 tablets (650 mg total) by mouth every 6 (six) hours as needed for headache. For mild pain   aspirin EC 81 MG tablet Take 81 mg by mouth daily.   atorvastatin 40 MG tablet Commonly known as: LIPITOR Take 40 mg by mouth daily.   brimonidine-timolol 0.2-0.5 % ophthalmic solution Commonly known as: COMBIGAN Place 1 drop into the right eye 3 (three) times daily. Instill one drop in  right eye morning, noon and bedtime   butalbital-acetaminophen-caffeine 50-325-40 MG tablet Commonly known as: FIORICET Take 1 tablet by mouth 2 (two) times daily as needed for up to 3 days for headache or migraine. SNF use onlu What changed: additional instructions   Calcium Carb-Cholecalciferol 600-200 MG-UNIT Tabs Take 1 tablet by mouth daily with supper.   cetirizine 10 MG tablet Commonly known as: ZYRTEC Take 1 tablet (10 mg total) by mouth daily.   clopidogrel 75 MG tablet Commonly known as: PLAVIX Take 75 mg by mouth daily with supper.   ferrous sulfate 325 (65 FE) MG tablet Take 325 mg by mouth daily with supper.   gabapentin 300 MG capsule Commonly known as: NEURONTIN 300 mg in the morning, 300 mg in the afternoon, 900 mg in the evening What changed:  how much to take how to take this when to take this additional instructions   Glucagon Emergency 1 MG Kit Inject 1 mg into the muscle once.   glucose 4 GM chewable tablet Chew 1 tablet (4 g total) by mouth as needed for low  blood sugar. <65   insulin aspart 100 UNIT/ML injection Commonly known as: novoLOG Inject into the skin continuous. (insulin pump averages about 20u over 24 hours)   losartan 100 MG tablet Commonly known as: COZAAR Take 1 tablet (100 mg total) by mouth daily. What changed:  medication strength how much to take   multivitamin with minerals Tabs tablet Take 1 tablet by mouth daily.   Netarsudil-Latanoprost 0.02-0.005 % Soln Place 1 drop into the right eye at bedtime.   Omnipod Classic Pods (Gen 3) Misc Inject 1 Piece into the skin every 3 (three) days.   oxyCODONE 5 MG immediate release tablet Commonly known as: Oxy IR/ROXICODONE Take 1 tablet (5 mg total) by mouth every 4 (four) hours as needed for up to 3 days for moderate pain. SNF use only   polyethylene glycol 17 g packet Commonly known as: MIRALAX / GLYCOLAX Take 17 g by mouth every other day.   promethazine 25 MG  suppository Commonly known as: PHENERGAN Place 25 mg rectally every 6 (six) hours as needed for nausea or vomiting.   tamsulosin 0.4 MG Caps capsule Commonly known as: FLOMAX Take 1 capsule (0.4 mg total) by mouth daily. Start taking on: May 17, 2021   venlafaxine 75 MG tablet Commonly known as: EFFEXOR Take 1 tablet (75 mg total) by mouth 2 (two) times daily.   vitamin C 500 MG tablet Commonly known as: ASCORBIC ACID Take 500 mg by mouth 2 (two) times daily.   Vitamin D-3 125 MCG (5000 UT) Tabs Take 5,000 Units by mouth daily.   Vyzulta 0.024 % Soln Generic drug: Latanoprostene Bunod Place 1 drop into the right eye daily.        Contact information for follow-up providers     Juluis Pitch, MD .   Specialty: Family Medicine Why: As needed Contact information: [address] [place] [address] [phone]         Duanne Guess, PA-C Follow up in 1 week(s).   Specialties: Orthopedic Surgery, Emergency Medicine Contact information: [place] [address] [phone]              Contact information for after-discharge care     [facility] Preferred SNF .   Service: Skilled Nursing Contact information: [place] [phone]                    Allergies  Allergen Reactions   Carbocaine [Mepivacaine Hcl] Palpitations    Low bp   Hydrocodone Other (See Comments)    Diabetic/  High sugars   Tussionex Pennkinetic Er [Hydrocod Polst-Cpm Polst Er] Other (See Comments)    Diabetic/  High sugars   Baclofen     confusion   Codeine Nausea Only and Other (See Comments)    Reaction: stress attacks/ headache and nausea Can take Vicodin and Percocet    Meperidine Nausea Only and Other (See Comments)    Reaction: stress attacks/ headache and nausea   Metoclopramide Hcl Nausea  Only and Other (See Comments)    Reaction: stress attacks/ headache    Stadol [Butorphanol] Nausea Only and Other (See Comments)    Reaction: stress attacks/ headache    Consultations: Orthopedics   Procedures/Studies: DG Ribs Unilateral W/Chest Right  Result Date: 05/07/2021 CLINICAL DATA:  Pain following fall EXAM: RIGHT RIBS AND CHEST - 3+ VIEW COMPARISON:  Chest radiograph 11/20/2019 FINDINGS: The cardiomediastinal silhouette is stable. There is asymmetric elevation of the right hemidiaphragm. There are patchy opacities throughout the right lung not present on the prior study from 2021. There is is a suspected trace right pleural effusion. The left lung is clear. There is no pneumothorax. There is a possible nondisplaced fracture of the right lateral seventh rib. The previously seen left rib fractures are not identified. Left shoulder arthroplasty hardware is again noted. IMPRESSION: 1. Possible nondisplaced fracture of the right lateral seventh rib. Correlate with point tenderness. 2. Patchy opacities in the right lung and suspected trace right pleural effusion could reflect pneumonia and a parapneumonic effusion in the correct clinical setting. Recommend follow-up radiographs in 6-8 weeks to ensure resolution. Electronically Signed   By: Valetta Mole M.D.   On: 05/07/2021 15:13   DG Thoracic Spine 2 View  Result Date: 05/12/2021 CLINICAL DATA:  Surgery, elective. Additional history provided: T11 kyphoplasty. Provided fluoroscopy time 1 minute. EXAM: THORACIC SPINE 2 VIEWS; DG C-ARM 1-60 MIN COMPARISON:  MRI of the thoracic and lumbar spine 05/09/2021. FINDINGS: An AP view intraprocedural fluoroscopic cine clip and a lateral view intraprocedural fluoroscopic image of the thoracic spine is submitted. On the provided images, kyphoplasty material is present within a thoracic vertebral body. The thoracic vertebral level can not be ascertained given the provided field of view. Correlate with the  procedural history. IMPRESSION: Intraprocedural fluoroscopic images from thoracic vertebral kyphoplasty, as described. Per the provided history, kyphoplasty was performed at the T11 level. However, the level of kyphoplasty is difficult to ascertain on today's provided images given the field of view. An acute to subacute compression fracture of the T10 inferior endplate was noted on the recent prior thoracic spine MRI of 05/09/2021. Correlate with the procedural history. Electronically Signed   By: Kellie Simmering D.O.   On: 05/12/2021 17:44   DG Lumbar Spine Complete  Result Date: 05/07/2021 CLINICAL DATA:  Low back pain.  Fall. EXAM: LUMBAR SPINE - COMPLETE 4+ VIEW COMPARISON:  None. FINDINGS: Fecal loading in visualized portions of the colon. Scoliotic curvature of the lumbar spine, apex of the) no other malalignment. Multilevel degenerative disc disease most prominent at L4-5. Vacuum disc phenomena at L4-5. No fractures are seen. Lower lumbar facet degenerative changes are identified. IMPRESSION: Degenerative changes as above. No fracture or traumatic malalignment. Fecal loading in the visualized colon. Electronically Signed   By: Dorise Bullion III M.D.   On: 05/07/2021 13:11   DG Elbow Complete Left  Result Date: 05/07/2021 CLINICAL DATA:  Pain after fall EXAM: LEFT ELBOW - COMPLETE 3+ VIEW COMPARISON:  None. FINDINGS: Significant soft tissue swelling. Joint effusion. Fracture through the base of the olecranon process with displacement. The fracture is comminuted with 1 dominant fracture fragment and numerous tiny fracture fragments. No other abnormalities. IMPRESSION: Olecranon process fracture as above with resulting soft tissue swelling and a joint effusion. Electronically Signed   By: Dorise Bullion III M.D.   On: 05/07/2021 13:13   CT HEAD WO CONTRAST (5MM)  Result Date: 05/07/2021 CLINICAL DATA:  Head trauma, fall, anticoagulation (on Plavix). EXAM: CT HEAD WITHOUT CONTRAST TECHNIQUE:  Contiguous axial images were obtained from the base of the skull through the vertex without intravenous contrast. COMPARISON:  08/11/2019 FINDINGS: Brain: Small remote lacunar infarcts near the genu of the left internal capsule and in the bilateral thalamus. Periventricular white matter and corona radiata hypodensities favor chronic ischemic microvascular white matter disease. Confluent hypodensity in the left  periventricular white matter on image 19 of series 2 is mildly increased from 08/11/2019 but likely a manifestation of chronic ischemic process. Small remote lacunar infarct of the head of the left caudate nucleus on image 17 series 2. Otherwise, the brainstem, cerebellum, cerebral peduncles, thalamus, basal ganglia, basilar cisterns, and ventricular system appear within normal limits. No intracranial hemorrhage, mass lesion, or acute CVA. Vascular: There is atherosclerotic calcification of the cavernous carotid arteries bilaterally. Skull: Unremarkable Sinuses/Orbits: Unremarkable Other: No supplemental non-categorized findings. IMPRESSION: 1. No acute intracranial findings. 2. Periventricular white matter and corona radiata hypodensities favor chronic ischemic microvascular white matter disease. Remote lacunar infarcts including involvement of the left caudate head, genu of the left internal capsule, and bilateral thalami. Electronically Signed   By: Van Clines M.D.   On: 05/07/2021 15:11   CT CHEST WO CONTRAST  Result Date: 05/07/2021 CLINICAL DATA:  Pneumonia, effusion or abscess suspected, xray done EXAM: CT CHEST WITHOUT CONTRAST TECHNIQUE: Multidetector CT imaging of the chest was performed following the standard protocol without IV contrast. COMPARISON:  Chest x-ray with ribs 05/07/2021 FINDINGS: Cardiovascular: Normal heart size. No significant pericardial effusion. The thoracic aorta is normal in caliber. Moderate atherosclerotic plaque of the thoracic aorta. At least Three-vessel  coronary artery calcifications. Mediastinum/Nodes: No gross hilar adenopathy, noting limited sensitivity for the detection of hilar adenopathy on this noncontrast study. Prominent but nonenlarged mediastinal lymph nodes. Multiple enlarged right axillary lymph nodes with as an example a 1.3 cm lymph node (2:51) and a 1.2 cm node (2:31). No left axillary lymph nodes. Thyroid gland, trachea, and esophagus demonstrate no significant findings. Lungs/Pleura: Right lung peribronchovascular nodular-like ground-glass and consolidative airspace opacities. No pulmonary nodule. No pulmonary mass. No pleural effusion. No pneumothorax. Upper Abdomen: No acute abnormality. Musculoskeletal: No chest wall abnormality.  Bilateral breast implants. No suspicious lytic or blastic osseous lesions. Likely old healed nondisplaced right lateral seventh rib fracture. No acute displaced fracture. Age-indeterminate, likely chronic compression fracture of the T11 vertebral body with greater than 45% height loss. IMPRESSION: 1. Right axillary lymphadenopathy. Concerning for malignancy. Recommend correlation with mammography. 2. Right lung peribronchovascular nodular-like ground-glass and consolidative airspace opacities suggestive of infection/inflammation. Markedly limited evaluation for lymphadenopathy on noncontrast study. Followup PA and lateral chest X-ray is recommended in 3-4 weeks following therapy to ensure resolution and exclude underlying malignancy. 3. Age-indeterminate, likely chronic, compression fracture of the T11 vertebral body with greater than 45% height loss. Correlate with tenderness to palpation for an acute component. 4. Aortic Atherosclerosis (ICD10-I70.0) including at least 3 vessel coronary calcifications. Electronically Signed   By: Iven Finn M.D.   On: 05/07/2021 17:06   CT Cervical Spine Wo Contrast  Result Date: 05/07/2021 CLINICAL DATA:  Neck trauma, fall EXAM: CT CERVICAL SPINE WITHOUT CONTRAST  TECHNIQUE: Multidetector CT imaging of the cervical spine was performed without intravenous contrast. Multiplanar CT image reconstructions were also generated. COMPARISON:  04/30/2019 CT scan FINDINGS: Alignment: 4 mm of chronic anterolisthesis at C3-4, unchanged from 04/30/2019. Endplate sclerosis and loss of disc height at all levels between C3 and C7. Anterior spurring and loss of space at the anterior C1-2 articulation. Skull base and vertebrae: No fracture or acute bony finding identified. Soft tissues and spinal canal: Atherosclerotic calcification of the common carotid arteries and vertebral arteries. Disc levels: Uncinate and facet spurring lead to mild foraminal impingement on the right at C3-4, C4-5, and C5-6; and moderate left foraminal impingement at C4-5 and C5-6. Suspected central narrowing of the thecal sac at  C3-4, C4-5, and C5-6 due to posterior osseous ridging. Upper chest: Unremarkable Other: No supplemental non-categorized findings. IMPRESSION: 1. Stable cervical spondylosis and degenerative disc disease causing multilevel impingement. 2. 4 mm of chronic degenerative anterolisthesis at C3-4, unchanged from 04/30/2019. 3. Atherosclerosis. Electronically Signed   By: Van Clines M.D.   On: 05/07/2021 15:04   CT Lumbar Spine Wo Contrast  Result Date: 05/07/2021 CLINICAL DATA:  Low back pain, trauma.  Fall. EXAM: CT LUMBAR SPINE WITHOUT CONTRAST TECHNIQUE: Multidetector CT imaging of the lumbar spine was performed without intravenous contrast administration. Multiplanar CT image reconstructions were also generated. COMPARISON:  Lumbar spine radiograph 05/07/2021 and lumbar MRI from 09/16/2018 FINDINGS: Segmentation: The lowest lumbar type non-rib-bearing vertebra is labeled as L5. Alignment: Substantial dextroconvex lumbar scoliosis with rotary component. 3 mm degenerative retrolisthesis at L1-2. Vertebrae: Chronic kyphotic angulation at the upper S2 level, as shown on 09/16/2018,  compatible with an old fracture. Sclerotic degenerative endplate findings eccentric to the left at L2-3 and eccentric to the right at L4-5 with loss of disc height and vacuum disc phenomenon. Paraspinal and other soft tissues: Atherosclerosis is present, including aortoiliac atherosclerotic disease. Prominence of stool in the visualized colon. Disc levels: T12-L1: Unremarkable. L1-2: Moderate left foraminal stenosis due to facet spurring. L2-3: Mild left foraminal stenosis and borderline central narrowing of the thecal sac due to intervertebral spurring, disc bulge, and facet spurring. L3-4: There at least moderate central narrowing of the thecal sac due to disc bulge at this level. Bilateral facet arthropathy and left ligamentum flavum redundancy. L4-5: Prominent central narrowing of the thecal sac with moderate right foraminal stenosis due to disc bulge and potential right lateral recess disc protrusion along with congenitally short pedicles and facet arthropathy. Substantial impingement was also present at this level on 09/16/2018. L5-S1: No impingement.  Disc bulge. IMPRESSION: 1. No acute fracture is identified. Chronic kyphotic angulation at the upper S2 level compatible with an old fracture that was also shown on 09/16/2018. 2. Lumbar spondylosis, scoliosis, congenitally short pedicles, and degenerative disc disease, causing prominent impingement at L4-5, moderate impingement at L1-2 and L3-4, and mild impingement at L2-3, as detailed above. 3.  Aortic Atherosclerosis (ICD10-I70.0). 4.  Prominent stool throughout the colon favors constipation. Electronically Signed   By: Van Clines M.D.   On: 05/07/2021 14:59   MR THORACIC SPINE WO CONTRAST  Result Date: 05/09/2021 CLINICAL DATA:  Back pain.  Recent fall. EXAM: MRI THORACIC AND LUMBAR SPINE WITHOUT CONTRAST TECHNIQUE: Multiplanar and multiecho pulse sequences of the thoracic and lumbar spine were obtained without intravenous contrast. COMPARISON:   CT chest and lumbar spine dated May 07, 2021. FINDINGS: MRI THORACIC SPINE FINDINGS Alignment:  Physiologic. Vertebrae: Acute to subacute T10 inferior endplate compression fracture with 40% height loss. No significant retropulsion. No additional fracture. No evidence of discitis or suspicious bone lesion. Cord:  Normal signal and morphology. Paraspinal and other soft tissues: Patchy infiltrate in the right upper and lower lobes. Small bilateral pleural effusions. Disc levels: Tiny central disc protrusion at T7-T8. No spinal canal or neuroforaminal stenosis at any level. MRI LUMBAR SPINE FINDINGS Segmentation:  Standard. Alignment:  Mild dextroscoliosis.  No significant listhesis. Vertebrae: No acute fracture, evidence of discitis, or bone lesion. Old healed sacral fractures. Conus medullaris and cauda equina: Conus extends to the T12-L1 level. Conus and cauda equina appear normal. Paraspinal and other soft tissues: Negative. Disc levels: T12-L1:  Negative. L1-L2: Mild disc bulging with superimposed central disc protrusion. Mild left facet arthropathy.  Moderate spinal canal and left neuroforaminal stenosis. No right neuroforaminal stenosis. L2-L3: Mild disc bulging with left far lateral disc osteophyte complex. Mild left facet arthropathy. Mild spinal canal and left neuroforaminal stenosis. No right neuroforaminal stenosis. L3-L4: Moderate right paracentral and subarticular disc protrusion. Mild bilateral facet arthropathy. Moderate to severe spinal canal and right lateral recess stenosis. Moderate left lateral recess stenosis. No neuroforaminal stenosis. L4-L5: Moderate disc bulging with right far lateral disc osteophyte complex. Moderate bilateral facet arthropathy. Severe spinal canal stenosis. Moderate to severe right neuroforaminal stenosis. Borderline mild left neuroforaminal stenosis. L5-S1: Small shallow broad-based posterior disc protrusion. Mild-to-moderate bilateral facet arthropathy. No stenosis.  IMPRESSION: Thoracic spine: 1. Acute to subacute T10 inferior endplate compression fracture with 40% height loss. No significant retropulsion. 2. Small bilateral pleural effusions. Patchy pneumonia in the right upper and lower lobes. Lumbar spine: 1. Multilevel degenerative changes of the lumbar spine as described above. Severe spinal canal stenosis and moderate to severe right neuroforaminal stenosis at L4-L5. 2. Moderate to severe spinal canal and right lateral recess stenosis at L3-L4. 3. Moderate spinal canal stenosis and left neuroforaminal stenosis at L1-L2. Electronically Signed   By: Titus Dubin M.D.   On: 05/09/2021 18:47   MR LUMBAR SPINE WO CONTRAST  Result Date: 05/09/2021 CLINICAL DATA:  Back pain.  Recent fall. EXAM: MRI THORACIC AND LUMBAR SPINE WITHOUT CONTRAST TECHNIQUE: Multiplanar and multiecho pulse sequences of the thoracic and lumbar spine were obtained without intravenous contrast. COMPARISON:  CT chest and lumbar spine dated May 07, 2021. FINDINGS: MRI THORACIC SPINE FINDINGS Alignment:  Physiologic. Vertebrae: Acute to subacute T10 inferior endplate compression fracture with 40% height loss. No significant retropulsion. No additional fracture. No evidence of discitis or suspicious bone lesion. Cord:  Normal signal and morphology. Paraspinal and other soft tissues: Patchy infiltrate in the right upper and lower lobes. Small bilateral pleural effusions. Disc levels: Tiny central disc protrusion at T7-T8. No spinal canal or neuroforaminal stenosis at any level. MRI LUMBAR SPINE FINDINGS Segmentation:  Standard. Alignment:  Mild dextroscoliosis.  No significant listhesis. Vertebrae: No acute fracture, evidence of discitis, or bone lesion. Old healed sacral fractures. Conus medullaris and cauda equina: Conus extends to the T12-L1 level. Conus and cauda equina appear normal. Paraspinal and other soft tissues: Negative. Disc levels: T12-L1:  Negative. L1-L2: Mild disc bulging with  superimposed central disc protrusion. Mild left facet arthropathy. Moderate spinal canal and left neuroforaminal stenosis. No right neuroforaminal stenosis. L2-L3: Mild disc bulging with left far lateral disc osteophyte complex. Mild left facet arthropathy. Mild spinal canal and left neuroforaminal stenosis. No right neuroforaminal stenosis. L3-L4: Moderate right paracentral and subarticular disc protrusion. Mild bilateral facet arthropathy. Moderate to severe spinal canal and right lateral recess stenosis. Moderate left lateral recess stenosis. No neuroforaminal stenosis. L4-L5: Moderate disc bulging with right far lateral disc osteophyte complex. Moderate bilateral facet arthropathy. Severe spinal canal stenosis. Moderate to severe right neuroforaminal stenosis. Borderline mild left neuroforaminal stenosis. L5-S1: Small shallow broad-based posterior disc protrusion. Mild-to-moderate bilateral facet arthropathy. No stenosis. IMPRESSION: Thoracic spine: 1. Acute to subacute T10 inferior endplate compression fracture with 40% height loss. No significant retropulsion. 2. Small bilateral pleural effusions. Patchy pneumonia in the right upper and lower lobes. Lumbar spine: 1. Multilevel degenerative changes of the lumbar spine as described above. Severe spinal canal stenosis and moderate to severe right neuroforaminal stenosis at L4-L5. 2. Moderate to severe spinal canal and right lateral recess stenosis at L3-L4. 3. Moderate spinal canal stenosis and left neuroforaminal  stenosis at L1-L2. Electronically Signed   By: Titus Dubin M.D.   On: 05/09/2021 18:47   CT ABDOMEN PELVIS W CONTRAST  Result Date: 05/08/2021 CLINICAL DATA:  Flank pain, fall. History of diabetes and hypertension. EXAM: CT ABDOMEN AND PELVIS WITH CONTRAST TECHNIQUE: Multidetector CT imaging of the abdomen and pelvis was performed using the standard protocol following bolus administration of intravenous contrast. CONTRAST:  64mL OMNIPAQUE  IOHEXOL 350 MG/ML SOLN COMPARISON:  Overlapping portions CT chest and CT lumbar spine from 05/07/2021. Also MRI abdomen 12/27/2017 and CT scan from 11/13/2017. FINDINGS: Lower chest: Coronary and descending thoracic aortic atherosclerotic calcification. Mitral and aortic valve calcifications. Trace right pleural effusion. Mild atelectasis or scarring at both lung bases particularly posteriorly in the right lower lobe. Bilateral breast implants. Sclerosis anteriorly in the right seventh rib suggesting a healing rib fracture. Late phase healing of left lower rib fractures as well. Hepatobiliary: Mild intrahepatic biliary dilatation. Common bile duct 1.0 cm in diameter, roughly stable from 11/13/2017. Small cysts in segment 2 of the liver on image 17 series 2 appears stable from 2019. Pancreas: Unremarkable Spleen: Unremarkable Adrenals/Urinary Tract: Vascular calcifications along the renal hila no hydronephrosis, hydroureter, or significant abnormal renal enhancement. The adrenal glands appear normal. Somewhat distended urinary bladder currently at 710 cc in volume. No substantial urinary bladder wall thickening. Stomach/Bowel: Substantial prominence of stool in the ascending and transverse colon terminating in the vicinity of the splenic flexure, I do not see an obvious splenic flexure lesion to account for this transition. A similar appearance was present previously on 11/13/2017 except for the transition being in the descending colon, and there may be a functional component to this unusual appearance of the colon. No dilated small bowel is identified. Vascular/Lymphatic: Atherosclerosis is present, including aortoiliac atherosclerotic disease. No pathologic adenopathy in the abdomen/pelvis is identified. Reproductive: Trace amount of gas in the cervical canal on image 50 series 6, although a similar appearance was present on 11/13/2017. No significant adnexal abnormality. Other: No ascites. Musculoskeletal: Right  intertrochanteric nail observed. Old deformity of the right inferior pubic ramus, stable, likely from a healed fracture. Chronic sacral fracture at the S2 level extending transversely as described on prior exams. Probable involvement of the sacral ala on a chronic basis, favoring an insufficiency fracture morphology. These fractures demonstrated a more acute appearance on the prior CT scan from 09/15/2018. There is an inferior endplate compression fracture at T10, no appreciable change from yesterday's CT chest. A small supraumbilical hernia contains adipose tissue on image 53 series 6. Dextroconvex lumbar scoliosis. IMPRESSION: 1. Healing bilateral lower rib fractures. 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 2. Likely late subacute or chronic inferior endplate compression fracture at T10 as shown on prior studies. 3. Distended urinary bladder at 710 cc in volume. 4. Chronic prominence of stool in the proximal colon. 5. Other imaging findings of potential clinical significance: Aortic Atherosclerosis (ICD10-I70.0). Coronary atherosclerosis. Mitral and aortic valve calcifications. Trace right pleural effusion. Chronic mild biliary dilatation. Small supraumbilical hernia contains adipose tissue. Dextroconvex lumbar scoliosis. Electronically Signed   By: Van Clines M.D.   On: 05/08/2021 15:47   DG C-Arm 1-60 Min  Result Date: 05/12/2021 CLINICAL DATA:  Surgery, elective. Additional history provided: T11 kyphoplasty. Provided fluoroscopy time 1 minute. EXAM: THORACIC SPINE 2 VIEWS; DG C-ARM 1-60 MIN COMPARISON:  MRI of the thoracic and lumbar spine 05/09/2021. FINDINGS: An AP view intraprocedural fluoroscopic cine clip and a lateral view intraprocedural fluoroscopic image of the thoracic spine is  submitted. On the provided images, kyphoplasty material is present within a thoracic vertebral body. The thoracic vertebral level can not be ascertained given the provided field of view. Correlate with  the procedural history. IMPRESSION: Intraprocedural fluoroscopic images from thoracic vertebral kyphoplasty, as described. Per the provided history, kyphoplasty was performed at the T11 level. However, the level of kyphoplasty is difficult to ascertain on today's provided images given the field of view. An acute to subacute compression fracture of the T10 inferior endplate was noted on the recent prior thoracic spine MRI of 05/09/2021. Correlate with the procedural history. Electronically Signed   By: Kellie Simmering D.O.   On: 05/12/2021 17:44   DG MINI C-ARM IMAGE ONLY  Result Date: 05/12/2021 There is no interpretation for this exam.  This order is for images obtained during a surgical procedure.  Please See "Surgeries" Tab for more information regarding the procedure.   (Echo, Carotid, EGD, Colonoscopy, ERCP)    Subjective: Seen and examined on the day of discharge.  Hemodynamically stable.  Pain mild to moderate.  Stable for discharge to skilled nursing facility.  Discharge Exam: Vitals:   05/16/21 0341 05/16/21 0800  BP: (!) 160/69 (!) 154/78  Pulse: 72 90  Resp: 18 14  Temp: 98.2 F (36.8 C) 97.9 F (36.6 C)  SpO2: 98%    Vitals:   05/15/21 1917 05/16/21 0202 05/16/21 0341 05/16/21 0800  BP: (!) 123/51 (!) 154/64 (!) 160/69 (!) 154/78  Pulse: 79 73 72 90  Resp: _0 Temp: 98.2 F (36.8 C)  98.2 F (36.8 C) 97.9 F (36.6 C)  TempSrc: Oral  Oral Oral  SpO2: 99% 99% 98%   Weight:      Height:        General: Pt is alert, awake, not in acute distress Cardiovascular: RRR, S1/S2 +, no rubs, no gallops Respiratory: CTA bilaterally, no wheezing, no rhonchi Abdominal: Soft, NT, ND, bowel sounds + Extremities: Left upper extremity in surgical wraps.  Some swelling in the fingers.    The results of significant diagnostics from this hospitalization (including imaging, microbiology, ancillary and laboratory) are listed below for reference.     Microbiology: Recent  Results (from the past 240 hour(s))  Resp Panel by RT-PCR (Flu A&B, Covid) Nasopharyngeal Swab     Status: None   Collection Time: 05/07/21  3:53 PM   Specimen: Nasopharyngeal Swab; Nasopharyngeal(NP) swabs in vial transport medium  Result Value Ref Range Status   SARS Coronavirus 2 by RT PCR NEGATIVE NEGATIVE Final    Comment: (NOTE) SARS-CoV-2 target nucleic acids are NOT DETECTED.  The SARS-CoV-2 RNA is generally detectable in upper respiratory specimens during the acute phase of infection. The lowest concentration of SARS-CoV-2 viral copies this assay can detect is 138 copies/mL. A negative result does not preclude SARS-Cov-2 infection and should not be used as the sole basis for treatment or other patient management decisions. A negative result may occur with  improper specimen collection/handling, submission of specimen other than nasopharyngeal swab, presence of viral mutation(s) within the areas targeted by this assay, and inadequate number of viral copies(<138 copies/mL). A negative result must be combined with clinical observations, patient history, and epidemiological information. The expected result is Negative.  Fact Sheet for Patients:  EntrepreneurPulse.com.au  Fact Sheet for Healthcare Providers:  IncredibleEmployment.be  This test is no t yet approved or cleared by the [place] FDA and  has been authorized for detection and/or diagnosis of SARS-CoV-2 by FDA under  an Emergency Use Authorization (EUA). This EUA will remain  in effect (meaning this test can be used) for the duration of the COVID-19 declaration under Section 564(b)(1) of the Act, 21 U.S.C.section 360bbb-3(b)(1), unless the authorization is terminated  or revoked sooner.       Influenza A by PCR NEGATIVE NEGATIVE Final   Influenza B by PCR NEGATIVE NEGATIVE Final    Comment: (NOTE) The Xpert Xpress SARS-CoV-2/FLU/RSV plus assay is intended as an aid in the  diagnosis of influenza from Nasopharyngeal swab specimens and should not be used as a sole basis for treatment. Nasal washings and aspirates are unacceptable for Xpert Xpress SARS-CoV-2/FLU/RSV testing.  Fact Sheet for Patients: EntrepreneurPulse.com.au  Fact Sheet for Healthcare Providers: IncredibleEmployment.be  This test is not yet approved or cleared by the [place] FDA and has been authorized for detection and/or diagnosis of SARS-CoV-2 by FDA under an Emergency Use Authorization (EUA). This EUA will remain in effect (meaning this test can be used) for the duration of the COVID-19 declaration under Section 564(b)(1) of the Act, 21 U.S.C. section 360bbb-3(b)(1), unless the authorization is terminated or revoked.  Performed at [facility], [address] [place], [place] [address]   Urine Culture     Status: None   Collection Time: 05/08/21  4:30 AM   Specimen: Urine, Clean Catch  Result Value Ref Range Status   Specimen Description   Final    URINE, CLEAN CATCH Performed at [facility], [address], [place], [place] [address]    Special Requests   Final    NONE Performed at [facility], [address] [place], [place] [address]    Culture   Final    NO GROWTH Performed at [facility], [place] [address], [place],  [address]    Report Status 05/09/2021 FINAL  Final  Resp Panel by RT-PCR (Flu A&B, Covid) Nasopharyngeal Swab     Status: None   Collection Time: 05/16/21  9:00 AM   Specimen: Nasopharyngeal Swab; Nasopharyngeal(NP) swabs in vial transport medium  Result Value Ref Range Status   SARS Coronavirus 2 by RT PCR NEGATIVE NEGATIVE Final    Comment: (NOTE) SARS-CoV-2 target nucleic acids are NOT DETECTED.  The SARS-CoV-2 RNA is generally detectable in upper respiratory specimens during the acute phase of infection. The lowest concentration of SARS-CoV-2 viral copies this  assay can detect is 138 copies/mL. A negative result does not preclude SARS-Cov-2 infection and should not be used as the sole basis for treatment or other patient management decisions. A negative result may occur with  improper specimen collection/handling, submission of specimen other than nasopharyngeal swab, presence of viral mutation(s) within the areas targeted by this assay, and inadequate number of viral copies(<138 copies/mL). A negative result must be combined with clinical observations, patient history, and epidemiological information. The expected result is Negative.  Fact Sheet for Patients:  EntrepreneurPulse.com.au  Fact Sheet for Healthcare Providers:  IncredibleEmployment.be  This test is no t yet approved or cleared by the [place] FDA and  has been authorized for detection and/or diagnosis of SARS-CoV-2 by FDA under an Emergency Use Authorization (EUA). This EUA will remain  in effect (meaning this test can be used) for the duration of the COVID-19 declaration under Section 564(b)(1) of the Act, 21 U.S.C.section 360bbb-3(b)(1), unless the authorization is terminated  or revoked sooner.       Influenza A by PCR NEGATIVE NEGATIVE Final   Influenza B by PCR  NEGATIVE NEGATIVE Final    Comment: (NOTE) The Xpert Xpress SARS-CoV-2/FLU/RSV plus assay is intended as an aid in the diagnosis of influenza from Nasopharyngeal swab specimens and should not be used as a sole basis for treatment. Nasal washings and aspirates are unacceptable for Xpert Xpress SARS-CoV-2/FLU/RSV testing.  Fact Sheet for Patients: EntrepreneurPulse.com.au  Fact Sheet for Healthcare Providers: IncredibleEmployment.be  This test is not yet approved or cleared by the [place] FDA and has been authorized for detection and/or diagnosis of SARS-CoV-2 by FDA under an Emergency Use Authorization (EUA). This EUA will  remain in effect (meaning this test can be used) for the duration of the COVID-19 declaration under Section 564(b)(1) of the Act, 21 U.S.C. section 360bbb-3(b)(1), unless the authorization is terminated or revoked.  Performed at [facility], [address] [place], [place] [address]      Labs: BNP (last 3 results) No results for input(s): BNP in the last 8760 hours. Basic Metabolic Panel: Recent Labs  Lab 05/10/21 0406 05/11/21 0334 05/12/21 0456 05/13/21 0420  NA 131* 128* 124* 128*  K 3.9 4.2 4.3 4.7  CL 97* 94* 90* 96*  CO2 _0 GLUCOSE 166* 291* 271* 243*  BUN _1 CREATININE 0.52 0.59 0.56 0.59  CALCIUM 8.5* 8.1* 8.3* 7.6*   Liver Function Tests: No results for input(s): AST, ALT, ALKPHOS, BILITOT, PROT, ALBUMIN in the last 168 hours. No results for input(s): LIPASE, AMYLASE in the last 168 hours. No results for input(s): AMMONIA in the last 168 hours. CBC: Recent Labs  Lab 05/16/21 0214  WBC 7.7  HGB 8.0*  HCT 22.7*  MCV 91.5  PLT 361   Cardiac Enzymes: No results for input(s): CKTOTAL, CKMB, CKMBINDEX, TROPONINI in the last 168 hours. BNP: Invalid input(s): POCBNP CBG: Recent Labs  Lab 05/15/21 1126 05/15/21 1657 05/15/21 2117 05/15/21 2337 05/16/21 0833  GLUCAP 192* 162* 85 145* 213*   D-Dimer No results for input(s): DDIMER in the last 72 hours. Hgb A1c No results for input(s): HGBA1C in the last 72 hours. Lipid Profile No results for input(s): CHOL, HDL, LDLCALC, TRIG, CHOLHDL, LDLDIRECT in the last 72 hours. Thyroid function studies No results for input(s): TSH, T4TOTAL, T3FREE, THYROIDAB in the last 72 hours.  Invalid input(s): FREET3 Anemia work up No results for input(s): VITAMINB12, FOLATE, FERRITIN, TIBC, IRON, RETICCTPCT in the last 72 hours. Urinalysis    Component Value Date/Time   COLORURINE YELLOW 05/08/2021 0430   APPEARANCEUR CLEAR 05/08/2021 0430   APPEARANCEUR Clear 10/19/2012 1628    LABSPEC 1.020 05/08/2021 0430   LABSPEC 1.017 10/19/2012 1628   PHURINE 5.5 05/08/2021 0430   GLUCOSEU 500 (A) 05/08/2021 0430   GLUCOSEU >=500 10/19/2012 1628   HGBUR NEGATIVE 05/08/2021 0430   BILIRUBINUR NEGATIVE 05/08/2021 0430   BILIRUBINUR Negative 10/19/2012 1628   KETONESUR 15 (A) 05/08/2021 0430   PROTEINUR NEGATIVE 05/08/2021 0430   NITRITE NEGATIVE 05/08/2021 0430   LEUKOCYTESUR NEGATIVE 05/08/2021 0430   LEUKOCYTESUR Negative 10/19/2012 1628   Sepsis Labs Invalid input(s): PROCALCITONIN,  WBC,  LACTICIDVEN Microbiology Recent Results (from the past 240 hour(s))  Resp Panel by RT-PCR (Flu A&B, Covid) Nasopharyngeal Swab     Status: None   Collection Time: 05/07/21  3:53 PM   Specimen: Nasopharyngeal Swab; Nasopharyngeal(NP) swabs in vial transport medium  Result Value Ref Range Status   SARS Coronavirus 2 by RT PCR NEGATIVE NEGATIVE Final    Comment: (NOTE) SARS-CoV-2 target nucleic acids  are NOT DETECTED.  The SARS-CoV-2 RNA is generally detectable in upper respiratory specimens during the acute phase of infection. The lowest concentration of SARS-CoV-2 viral copies this assay can detect is 138 copies/mL. A negative result does not preclude SARS-Cov-2 infection and should not be used as the sole basis for treatment or other patient management decisions. A negative result may occur with  improper specimen collection/handling, submission of specimen other than nasopharyngeal swab, presence of viral mutation(s) within the areas targeted by this assay, and inadequate number of viral copies(<138 copies/mL). A negative result must be combined with clinical observations, patient history, and epidemiological information. The expected result is Negative.  Fact Sheet for Patients:  EntrepreneurPulse.com.au  Fact Sheet for Healthcare Providers:  IncredibleEmployment.be  This test is no t yet approved or cleared by the [place] FDA  and  has been authorized for detection and/or diagnosis of SARS-CoV-2 by FDA under an Emergency Use Authorization (EUA). This EUA will remain  in effect (meaning this test can be used) for the duration of the COVID-19 declaration under Section 564(b)(1) of the Act, 21 U.S.C.section 360bbb-3(b)(1), unless the authorization is terminated  or revoked sooner.       Influenza A by PCR NEGATIVE NEGATIVE Final   Influenza B by PCR NEGATIVE NEGATIVE Final    Comment: (NOTE) The Xpert Xpress SARS-CoV-2/FLU/RSV plus assay is intended as an aid in the diagnosis of influenza from Nasopharyngeal swab specimens and should not be used as a sole basis for treatment. Nasal washings and aspirates are unacceptable for Xpert Xpress SARS-CoV-2/FLU/RSV testing.  Fact Sheet for Patients: EntrepreneurPulse.com.au  Fact Sheet for Healthcare Providers: IncredibleEmployment.be  This test is not yet approved or cleared by the [place] FDA and has been authorized for detection and/or diagnosis of SARS-CoV-2 by FDA under an Emergency Use Authorization (EUA). This EUA will remain in effect (meaning this test can be used) for the duration of the COVID-19 declaration under Section 564(b)(1) of the Act, 21 U.S.C. section 360bbb-3(b)(1), unless the authorization is terminated or revoked.  Performed at [facility], [address] [place], [place] [address]   Urine Culture     Status: None   Collection Time: 05/08/21  4:30 AM   Specimen: Urine, Clean Catch  Result Value Ref Range Status   Specimen Description   Final    URINE, CLEAN CATCH Performed at [facility], [address] [place], [place] [address]    Special Requests   Final    NONE Performed at [facility], [address] [place], [place] [address]    Culture   Final    NO GROWTH Performed at [facility], [place] [address] [name], [place] [address]    Report  Status 05/09/2021 FINAL  Final  Resp Panel by RT-PCR (Flu A&B, Covid) Nasopharyngeal Swab     Status: None   Collection Time: 05/16/21  9:00 AM   Specimen: Nasopharyngeal Swab; Nasopharyngeal(NP) swabs in vial transport medium  Result Value Ref Range Status   SARS Coronavirus 2 by RT PCR NEGATIVE NEGATIVE Final    Comment: (NOTE) SARS-CoV-2 target nucleic acids are NOT DETECTED.  The SARS-CoV-2 RNA is generally detectable in upper respiratory specimens during the acute phase of infection. The lowest concentration of SARS-CoV-2 viral copies this assay can detect is 138 copies/mL. A negative result does not preclude SARS-Cov-2 infection and should not be used as the sole basis for treatment or other patient management decisions. A negative result may  occur with  improper specimen collection/handling, submission of specimen other than nasopharyngeal swab, presence of viral mutation(s) within the areas targeted by this assay, and inadequate number of viral copies(<138 copies/mL). A negative result must be combined with clinical observations, patient history, and epidemiological information. The expected result is Negative.  Fact Sheet for Patients:  EntrepreneurPulse.com.au  Fact Sheet for Healthcare Providers:  IncredibleEmployment.be  This test is no t yet approved or cleared by the [place] FDA and  has been authorized for detection and/or diagnosis of SARS-CoV-2 by FDA under an Emergency Use Authorization (EUA). This EUA will remain  in effect (meaning this test can be used) for the duration of the COVID-19 declaration under Section 564(b)(1) of the Act, 21 U.S.C.section 360bbb-3(b)(1), unless the authorization is terminated  or revoked sooner.       Influenza A by PCR NEGATIVE NEGATIVE Final   Influenza B by PCR NEGATIVE NEGATIVE Final    Comment: (NOTE) The Xpert Xpress SARS-CoV-2/FLU/RSV plus assay is intended as an aid in the  diagnosis of influenza from Nasopharyngeal swab specimens and should not be used as a sole basis for treatment. Nasal washings and aspirates are unacceptable for Xpert Xpress SARS-CoV-2/FLU/RSV testing.  Fact Sheet for Patients: EntrepreneurPulse.com.au  Fact Sheet for Healthcare Providers: IncredibleEmployment.be  This test is not yet approved or cleared by the [place] FDA and has been authorized for detection and/or diagnosis of SARS-CoV-2 by FDA under an Emergency Use Authorization (EUA). This EUA will remain in effect (meaning this test can be used) for the duration of the COVID-19 declaration under Section 564(b)(1) of the Act, 21 U.S.C. section 360bbb-3(b)(1), unless the authorization is terminated or revoked.  Performed at [facility], [address], [place], [place] [address]      Time coordinating discharge: Over 30 minutes  SIGNED:   Sidney Ace, MD  [facility] 05/16/2021, 10:03 AM Pager   If 7PM-7AM, please contact night-coverage

## 2021-05-16 NOTE — Progress Notes (Signed)
ORTHOPAEDICS:  Contacted by nursing about bloody drainage saturating the dressing despite their attempts at reinforcing.  I completely took down the dressing to find a large clot.  The incision was well approximated.  Small amount of bloody drainage was noted but no active bleeder.  No erythema.  A bulky dressing with compression was applied followed by application of a posterior splint.  Good capillary refill is noted.  Review of orders found that the patient has been on Plavix as well as every 6 hour Toradol.  Toradol was discontinued.  We will continue to monitor.  Erin Daniels M.D.

## 2021-05-16 NOTE — ED Triage Notes (Signed)
Pt was discharged from [facility] today post op of a left elbow procedure. Pt is a resident at [facility]. Pt elbow is bleeding and the staples are very loose holding in the incision. Pt is on plavix.

## 2021-05-16 NOTE — Progress Notes (Signed)
   Subjective: 4 Days Post-Op Procedure(s) (LRB): OPEN REDUCTION INTERNAL FIXATION (ORIF) ELBOW/OLECRANON FRACTURE (Left) KYPHOPLASTY - T11 (N/A) Pain well controlled. New splint/dressing applied to LUE last night due to drainage/saturation. Splint CDI this am. Mild swelling in left hand. Back pain continuing to improve s/p Kypho   Objective: Vital signs in last 24 hours: Temp:  [97.9 F (36.6 C)-98.4 F (36.9 C)] 98.2 F (36.8 C) (09/26 0341) Pulse Rate:  [72-88] 72 (09/26 0341) Resp:  [14-18] 18 (09/26 0341) BP: (123-160)/(48-69) 160/69 (09/26 0341) SpO2:  [97 %-100 %] 98 % (09/26 0341)  Intake/Output from previous day: 09/25 0701 - 09/26 0700 In: 240 [P.O.:240] Out: 629 [Urine:629] Intake/Output this shift: No intake/output data recorded.  Recent Labs    05/16/21 0214  HGB 8.0*   Recent Labs    05/16/21 0214  WBC 7.7  RBC 2.48*  HCT 22.7*  PLT 361   No results for input(s): NA, K, CL, CO2, BUN, CREATININE, GLUCOSE, CALCIUM in the last 72 hours. No results for input(s): LABPT, INR in the last 72 hours.  EXAM General - Patient is Alert, Appropriate, and Oriented Extremity - Neurovascular intact Sensation intact distally Dressing - dressing C/D/I, long arm splint intact Motor Function - intact, moving foot and toes well on exam.   Past Medical History:  Diagnosis Date   Angina pectoris ([place])    At high risk for falls    Cancer [facility])    Carpal tunnel syndrome    BILATERAL   Cervicalgia    Complication of anesthesia    difficult to arouse   Diabetes mellitus without complication (HCC)    Type I   Diabetic neuropathy (HCC)    Diabetic neuropathy (HCC)    Diabetic retinopathy ([place])    Diabetic retinopathy ([place])    Dizziness    Gastroparesis    Headache    MIGRAINE   Herniated disc, cervical    Hypercholesteremia    Hypertension    Hypokalemia    Hypotensive episode    Hypothyroidism    Insulin pump in place    Myofascial muscle pain    Numbness  of arm    left   Osteoporosis    Poor vision    Rosacea    Seizures (HCC)    Slurred speech    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   Stroke (HCC)    x5   Vertigo    Wears glasses     Assessment/Plan:   4 Days Post-Op Procedure(s) (LRB): OPEN REDUCTION INTERNAL FIXATION (ORIF) ELBOW/OLECRANON FRACTURE (Left) KYPHOPLASTY - T11 (N/A) Principal Problem:   Fall Active Problems:   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   Weight as of this encounter: 53.5 kg. Advance diet Continue with PT, NWB LUE Follow up with [place] this week for wound check Keep splint clean and dry at all times CM to assist with discharge to SNF      T. Rachelle Hora, PA-C [facility] 05/16/2021, 8:07 AM

## 2021-05-16 NOTE — TOC Progression Note (Addendum)
Transition of Care [facility]) - Progression Note    Patient Details  Name: Erin Daniels MRN: [id number] Date of Birth: 28-Feb-1946  Transition of Care [facility]) CM/SW [place], RN Phone Number: 05/16/2021, 9:00 AM  Clinical Narrative:     Spoke with the patient and her Husband, her daughter is in ICU at [facility] and not doing well.  She still plans to go to [facility] today, She will transport via EMS as her  and her husband feels that is the safest for them.  I explained that she would not be able to go to [facility]  to visit her daughter prior to going to [facility], They both stated understanding, will proceed with the DC to [facility] today, Insurance has been approved  The patient is to go to room 405 today and the bedside nurse to call report to [phone]       Expected Discharge Plan and Services                                                 Social Determinants of Health (SDOH) Interventions    Readmission Risk Interventions Readmission Risk Prevention Plan 12/07/2018  Transportation Screening Complete  HRI or Home Care Consult Complete  Social Work Consult for [place] Planning/Counseling Complete  Medication Review [employer]) Complete  Some recent data might be hidden

## 2021-05-16 NOTE — Progress Notes (Signed)
Report called to [facility], report given to [employer].

## 2021-05-16 NOTE — Plan of Care (Signed)
  Problem: Education: Goal: Knowledge of General Education information will improve Description: Including pain rating scale, medication(s)/side effects and non-pharmacologic comfort measures Outcome: Completed/Met   Problem: Health Behavior/Discharge Planning: Goal: Ability to manage health-related needs will improve Outcome: Completed/Met   Problem: Clinical Measurements: Goal: Ability to maintain clinical measurements within normal limits will improve Outcome: Completed/Met Goal: Will remain free from infection Outcome: Completed/Met Goal: Diagnostic test results will improve Outcome: Completed/Met Goal: Respiratory complications will improve Outcome: Completed/Met Goal: Cardiovascular complication will be avoided Outcome: Completed/Met   Problem: Activity: Goal: Risk for activity intolerance will decrease Outcome: Completed/Met   Problem: Nutrition: Goal: Adequate nutrition will be maintained Outcome: Completed/Met   Problem: Coping: Goal: Level of anxiety will decrease Outcome: Completed/Met   Problem: Elimination: Goal: Will not experience complications related to bowel motility Outcome: Completed/Met Goal: Will not experience complications related to urinary retention Outcome: Completed/Met   Problem: Pain Managment: Goal: General experience of comfort will improve Outcome: Completed/Met   Problem: Safety: Goal: Ability to remain free from injury will improve Outcome: Completed/Met   Problem: Skin Integrity: Goal: Risk for impaired skin integrity will decrease Outcome: Completed/Met   Problem: Education: Goal: Ability to describe self-care measures that may prevent or decrease complications (Diabetes Survival Skills Education) will improve Outcome: Completed/Met Goal: Individualized Educational Video(s) Outcome: Completed/Met   Problem: Coping: Goal: Ability to adjust to condition or change in health will improve Outcome: Completed/Met   Problem:  Fluid Volume: Goal: Ability to maintain a balanced intake and output will improve Outcome: Completed/Met   Problem: Health Behavior/Discharge Planning: Goal: Ability to identify and utilize available resources and services will improve Outcome: Completed/Met Goal: Ability to manage health-related needs will improve Outcome: Completed/Met   Problem: Metabolic: Goal: Ability to maintain appropriate glucose levels will improve Outcome: Completed/Met   Problem: Nutritional: Goal: Maintenance of adequate nutrition will improve Outcome: Completed/Met Goal: Progress toward achieving an optimal weight will improve Outcome: Completed/Met   Problem: Skin Integrity: Goal: Risk for impaired skin integrity will decrease Outcome: Completed/Met   Problem: Tissue Perfusion: Goal: Adequacy of tissue perfusion will improve Outcome: Completed/Met   Problem: Urinary Elimination: Goal: Signs and symptoms of infection will decrease Outcome: Completed/Met   Problem: Urinary Elimination: Goal: Signs and symptoms of infection will decrease Outcome: Completed/Met

## 2021-05-16 NOTE — Progress Notes (Signed)
Physical Therapy Treatment Patient Details Name: Erin Daniels MRN: [id number] DOB: [birth year] Today's Date: 05/16/2021   History of Present Illness Pt is a 75 y.o. female with medical history significant for insulin-dependent diabetes mellitus, history of CVA, diabetic retinopathy, hypertension and hypothyroidism who was brought into the ER by her husband for evaluation following a fall the night prior to her admission. Pt found to have L olecranon process fx (NWB LUE), R 7th rib fx, and unknown age T11 compression fx. Pt s/p T10 kyphoplasty and LUE olecranon ORIF.    PT Comments    Pt in bed with RN giving meds.  She c/o ace wrap being too tight especially around hand.  Fingers are swollen but unsure if more than yesterday.  She does have a red area base of L thumb which appears to be from wrapping.  It is loosened and reapplied at a lesser tension around hand and RN aware of red area.  Pt reports increased comfort with less tension.  She is noted to have some break through bleeding post dressing around elbow area.  Stated MD re-wrapped splint last night.  Blood note don ace and on towel/pillow case under her arm when she moved to sitting.    She is able to stand and transfer to chair at bedside.  She seemed generally overwhelmed during session today and mobility limited to chair.  Upon writing entry, noted TOC note about Daughter not doing well at [facility] which pt did not mention but explained some of her disposition today.    Recommendations for follow up therapy are one component of a multi-disciplinary discharge planning process, led by the attending physician.  Recommendations may be updated based on patient status, additional functional criteria and insurance authorization.  Follow Up Recommendations  SNF     Equipment Recommendations  Other (comment)    Recommendations for Other Services       Precautions / Restrictions Precautions Precautions: Fall Required Braces or Orthoses:  Other Brace Other Brace: long arm splint to LUE Restrictions Weight Bearing Restrictions: Yes LUE Weight Bearing: Non weight bearing     Mobility  Bed Mobility Overal bed mobility: Needs Assistance Bed Mobility: Supine to Sit     Supine to sit: Min guard          Transfers Overall transfer level: Needs assistance Equipment used: Hemi-walker Transfers: Sit to/from Stand Sit to Stand: Min assist;Min guard            Ambulation/Gait Ambulation/Gait assistance: Min guard;Min Web designer (Feet): 3 Feet Assistive device: Hemi-walker Gait Pattern/deviations: Step-through pattern;Decreased step length - right;Decreased step length - left Gait velocity: decreased   General Gait Details: limited to transfer to chair today.  seemed generally overwhelmed today vs yesterday some increased fatigue   Stairs             Wheelchair Mobility    Modified Rankin (Stroke Patients Only)       Balance Overall balance assessment: Needs assistance;History of Falls Sitting-balance support: No upper extremity supported;Feet unsupported Sitting balance-Leahy Scale: Fair   Postural control: Posterior lean;Right lateral lean Standing balance support: Single extremity supported;During functional activity Standing balance-Leahy Scale: Poor                              Cognition Arousal/Alertness: Awake/alert Behavior During Therapy: WFL for tasks assessed/performed Overall Cognitive Status: Within Functional Limits for tasks assessed  Exercises      General Comments        Pertinent Vitals/Pain Pain Assessment: Faces Faces Pain Scale: Hurts little more Pain Location: general discomfort from immobility and L UE/back Pain Descriptors / Indicators: Aching;Sore Pain Intervention(s): Limited activity within patient's tolerance;Monitored during session;Premedicated before session;Repositioned     Home Living                      Prior Function            PT Goals (current goals can now be found in the care plan section) Progress towards PT goals: Progressing toward goals    Frequency    BID      PT Plan Current plan remains appropriate    Co-evaluation              AM-PAC PT "6 Clicks" Mobility   Outcome Measure  Help needed turning from your back to your side while in a flat bed without using bedrails?: A Little Help needed moving from lying on your back to sitting on the side of a flat bed without using bedrails?: A Little Help needed moving to and from a bed to a chair (including a wheelchair)?: A Little Help needed standing up from a chair using your arms (e.g., wheelchair or bedside chair)?: A Little Help needed to walk in hospital room?: A Little Help needed climbing 3-5 steps with a railing? : A Lot 6 Click Score: 17    End of Session Equipment Utilized During Treatment: Gait belt Activity Tolerance: Patient tolerated treatment well;Patient limited by fatigue Patient left: in chair;with call bell/phone within reach;with chair alarm set Nurse Communication: Mobility status;Other (comment) PT Visit Diagnosis: Unsteadiness on feet (R26.81);History of falling (Z91.81);Muscle weakness (generalized) (M62.81);Difficulty in walking, not elsewhere classified (R26.2);Pain Pain - Right/Left: Left Pain - part of body: Arm     Time: 0947-0962 PT Time Calculation (min) (ACUTE ONLY): 25 min  Charges:  $Gait Training: 8-22 mins $Therapeutic Activity: 8-22 mins                    Chesley Noon, PTA 05/16/21, 10:03 AM

## 2021-05-16 NOTE — H&P (Addendum)
History and Physical   Erin Daniels YWV:[id number] DOB: 06/02/1946 DOA: 05/16/2021  PCP: Erin Pitch, MD  Outpatient Specialists: Erin Daniels, endocrinology Patient coming from: [place]  I have personally briefly reviewed patient's old medical records in [place].  Chief Concern: Bleeding from left arm  HPI: Erin Daniels is a 75 y.o. female with medical history significant for insulin-dependent diabetes mellitus, history of CVA, diabetic retinopathy, hypertension, hypothyroid, who presents to the emergency department from [name] for chief concerns of left arm bleeding.  Patient states that the bleeding has been continuous and facility was unable to stop it.  Of note she was discharged from the hospital on same day as hospital admission.  At bedside she is able to tell me her name, age, current location of hospital, current calendar year.  She had 2+ pulses in bilateral radial, bilateral pedal pulses.  There is no visual signs of exsanguination.  Patient appears comfortable, not in acute distress.  She states that since she fell, her arm hurts.  She denies trauma to her arm since being discharged.  She denies fever, chest pain, shortness of breath, abdominal pain, dysuria, nausea, vomiting.  Social history: Patient was discharged to [facility] but prior she lives at home with her husband.  ROS: Constitutional: no weight change, no fever ENT/Mouth: no sore throat, no rhinorrhea Eyes: no eye pain, no vision changes Cardiovascular: no chest pain, no dyspnea,  no edema, no palpitations Respiratory: no cough, no sputum, no wheezing Gastrointestinal: no nausea, no vomiting, no diarrhea, no constipation Genitourinary: no urinary incontinence, no dysuria, no hematuria Musculoskeletal: no arthralgias, no myalgias Skin: no skin lesions, no pruritus, Neuro: + weakness, no loss of consciousness, no syncope Psych: no anxiety, no depression, + decrease  appetite Heme/Lymph: no bruising, + bleeding  ED Course: Discussed with emergency medicine provider, patient requiring hospitalization for chief concerns of left arm bleeding.  Vitals in the emergency department was remarkable for temperature 98.5, respiration rate of 17, heart rate of 97, blood pressure 132/52, SPO2 of 98% on room air.  Labs in the emergency department was remarkable for sodium 126, potassium 4.7, chloride 94, bicarb 24, BUN of 19, serum creatinine of 0.60, glucose nonfasting was 200, WBC of 9, hemoglobin 7.1, platelets 425.  COVID/influenza A/influenza B PCR were negative.  In the emergency department, patient was given combat gauze, splint was replaced, dressings replaced for the arm.  EDP discussed case with Erin Daniels from Ortho service who recommends admission to medicine for n.p.o. after midnight for possible exploration in the a.m.  Assessment/Plan  Principal Problem:   Acute anemia Active Problems:   Essential hypertension   Depression   Chronic pain syndrome   Chronic cerebrovascular accident (CVA)   Constipation   Anemia of chronic disease   Chronic migraine without aura   Bilateral sacral insufficiency fracture   Subarachnoid hematoma (HCC)   HLD (hyperlipidemia)   Hypothyroidism   Thrombocytosis   # Acute anemia secondary to post op bleed-Erin Daniels from orthopedic service has been consulted and recommended medicine admission for possible exploration in the OR on the following day - N.p.o. after midnight -1 unit of packed red blood has been ordered per EDP for transfusion - CBC in the a.m. - Continue combat dressing  # History of CAD-patient's last dose of Plavix was on 05/15/2021 at 1717 -Patient did not receive her Plavix dose on 05/16/2021 -We will continue to hold Plavix at this time  # Insulin-dependent diabetes mellitus-patient  is on a pump at home - A1c on 05/07/2021 was 6.6 - Insulin SSI with at bedtime coverage ordered  # Diabetic  neuropathy-resumed home gabapentin per home dosing  # Thrombocytosis presumed reactive secondary to acute anemia bleed -CBC in the a.m.  # Hyperlipidemia-atorvastatin 40 mg daily presumed  # Hypertension-losartan 100 mg daily resumed  # Anxiety/depression-venlafaxine 75 mg p.o. twice daily resumed  # Discharge planning: At bedside patient was able to flex and extend her knee without difficulty and exhibited appropriate strength - She endorsed to me that she did not want to go to facility and that she wanted to go home.  She feels that her husband can help her at home given that she can walk.  # DVT prophylaxis-pharmacologic DVT prophylaxis has not been initiated by myself as patient is presenting with anemia and bleeding at postsurgical site - Anticipate OR tomorrow - A.m. team to initiate pharmacologic DVT prophylaxis when appropriate  I-S, flutter valve  A.m. labs: CBC, BMP  Chart reviewed.   DVT prophylaxis: TED hose Code Status: Full code Diet: N.p.o. after midnight Family Communication: No Disposition Plan: Pending clinical course Consults called: Orthopedic Admission status: MedSurg, observation, telemetry  Past Medical History:  Diagnosis Date   Angina pectoris ([place])    At high risk for falls    Cancer [facility])    Carpal tunnel syndrome    BILATERAL   Cervicalgia    Complication of anesthesia    difficult to arouse   Diabetes mellitus without complication (HCC)    Type I   Diabetic neuropathy ([place])    Diabetic neuropathy ([place])    Diabetic retinopathy ([place])    Diabetic retinopathy ([place])    Dizziness    Gastroparesis    Headache    MIGRAINE   Herniated disc, cervical    Hypercholesteremia    Hypertension    Hypokalemia    Hypotensive episode    Hypothyroidism    Insulin pump in place    Myofascial muscle pain    Numbness of arm    left   Osteoporosis    Poor vision    Rosacea    Seizures (HCC)    Slurred speech    Stroke (cerebrum) ([place])    4/19    Stroke ([place])    x5   Vertigo    Wears glasses    Past Surgical History:  Procedure Laterality Date   APPENDECTOMY     AUGMENTATION MAMMAPLASTY Bilateral 2011   BILATERAL CARPAL TUNNEL RELEASE     BITRECTOMY     MEMBRAIN PEEL EYE   COLONOSCOPY     COLONOSCOPY WITH PROPOFOL N/A 03/17/2020   Procedure: COLONOSCOPY WITH PROPOFOL;  Surgeon: Toledo, Benay Pike, MD;  Location: [facility] ENDOSCOPY;  Service: Gastroenterology;  Laterality: N/A;   EYE SURGERY Bilateral    cataract   FRACTURE SURGERY     HEMORROIDECTOMY     HERNIA REPAIR     umbilical   INTRAMEDULLARY (IM) NAIL INTERTROCHANTERIC Right 12/07/2018   Procedure: INTRAMEDULLARY (IM) NAIL INTERTROCHANTRIC;  Surgeon: Thornton Park, MD;  Location: [facility] ORS;  Service: Orthopedics;  Laterality: Right;   JOINT REPLACEMENT     right hip   KYPHOPLASTY N/A 05/12/2021   Procedure: KYPHOPLASTY - T11;  Surgeon: Hessie Knows, MD;  Location: [facility] ORS;  Service: Orthopedics;  Laterality: N/A;   OPEN REDUCTION INTERNAL FIXATION (ORIF) DISTAL RADIAL FRACTURE Right 05/29/2015   Procedure: OPEN REDUCTION INTERNAL FIXATION (ORIF) RIGHT DISTAL RADIUS FRACTURE WITH ALLOGRAFT BONE GRAFTING FOR  REPAIR AND RECONSTRUCTION;  Surgeon: Roseanne Kaufman, MD;  Location: [place];  Service: Orthopedics;  Laterality: Right;   OPEN REDUCTION INTERNAL FIXATION (ORIF) METACARPAL Right 01/09/2019   Procedure: OPEN REDUCTION INTERNAL FIXATION (ORIF) RIGHT SECOND METACARPAL FRACTURE;  Surgeon: Corky Mull, MD;  Location: [facility] ORS;  Service: Orthopedics;  Laterality: Right;   ORIF ELBOW FRACTURE Left 05/12/2021   Procedure: OPEN REDUCTION INTERNAL FIXATION (ORIF) ELBOW/OLECRANON FRACTURE;  Surgeon: Hessie Knows, MD;  Location: [facility] ORS;  Service: Orthopedics;  Laterality: Left;   SHOULDER ARTHROSCOPY Left    SHOULDER HEMI-ARTHROPLASTY Left    SYNOVECTOMY     TONSILLECTOMY     TRIGGER FINGER RELEASE     TUBAL LIGATION     Social History:  reports that she has never smoked.  She has never used smokeless tobacco. She reports that she does not drink alcohol and does not use drugs.  Allergies  Allergen Reactions   Carbocaine [Mepivacaine Hcl] Palpitations    Low bp   Hydrocodone Other (See Comments)    Diabetic/  High sugars   Tussionex Pennkinetic Er [Hydrocod Polst-Cpm Polst Er] Other (See Comments)    Diabetic/  High sugars   Baclofen     confusion   Codeine Nausea Only and Other (See Comments)    Reaction: stress attacks/ headache and nausea Can take Vicodin and Percocet    Meperidine Nausea Only and Other (See Comments)    Reaction: stress attacks/ headache and nausea   Metoclopramide Hcl Nausea Only and Other (See Comments)    Reaction: stress attacks/ headache    Stadol [Butorphanol] Nausea Only and Other (See Comments)    Reaction: stress attacks/ headache   Family History  Problem Relation Age of Onset   Hypertension Mother    Stroke Mother    Heart failure Father    Family history: Family history reviewed and not pertinent  Prior to Admission medications   Medication Sig Start Date End Date Taking? Authorizing Provider  acetaminophen (TYLENOL) 325 MG tablet Take 2 tablets (650 mg total) by mouth every 6 (six) hours as needed for headache. For mild pain 09/18/18   Saundra Shelling, MD  aspirin EC 81 MG tablet Take 81 mg by mouth daily.    [provider]  atorvastatin (LIPITOR) 40 MG tablet Take 40 mg by mouth daily. 04/13/21   [provider]  brimonidine-timolol (COMBIGAN) 0.2-0.5 % ophthalmic solution Place 1 drop into the right eye 3 (three) times daily. Instill one drop in right eye morning, noon and bedtime     [provider]  butalbital-acetaminophen-caffeine (FIORICET) 50-325-40 MG tablet Take 1 tablet by mouth 2 (two) times daily as needed for up to 3 days for headache or migraine. SNF use onlu 05/16/21 05/19/21  Ralene Muskrat B, MD  Calcium Carb-Cholecalciferol 600-200 MG-UNIT TABS Take 1 tablet by mouth  daily with supper.     [provider]  cetirizine (ZYRTEC) 10 MG tablet Take 1 tablet (10 mg total) by mouth daily. 10/10/18   Medina-Vargas, Monina C, NP  Cholecalciferol (VITAMIN D-3) 5000 units TABS Take 5,000 Units by mouth daily.    [provider]  clopidogrel (PLAVIX) 75 MG tablet Take 75 mg by mouth daily with supper.    [provider]  ferrous sulfate 325 (65 FE) MG tablet Take 325 mg by mouth daily with supper.     [provider]  gabapentin (NEURONTIN) 300 MG capsule 300 mg in the morning, 300 mg in the afternoon, 900  mg in the evening Patient taking differently: Take 300-900 mg by mouth See admin instructions. Take 1 capsule (300mg ) by mouth every morning, 1 capsule (300mg ) by mouth at lunch and take 3 capsules (900mg ) by mouth every night at bedtime 10/10/18   Medina-Vargas, Monina C, NP  GLUCAGON EMERGENCY 1 MG injection Inject 1 mg into the muscle once. 01/24/19   [provider]  glucose 4 GM chewable tablet Chew 1 tablet (4 g total) by mouth as needed for low blood sugar. <65 10/10/18   Medina-Vargas, Monina C, NP  insulin aspart (NOVOLOG) 100 UNIT/ML injection Inject into the skin continuous. (insulin pump averages about 20u over 24 hours)    [provider]  Insulin Disposable Pump (OMNIPOD CLASSIC PODS, GEN 3,) MISC Inject 1 Piece into the skin every 3 (three) days. 04/18/21   [provider]  losartan (COZAAR) 100 MG tablet Take 1 tablet (100 mg total) by mouth daily. 05/16/21   Sidney Ace, MD  Multiple Vitamin (MULTIVITAMIN WITH MINERALS) TABS tablet Take 1 tablet by mouth daily.    [provider]  Netarsudil-Latanoprost 0.02-0.005 % SOLN Place 1 drop into the right eye at bedtime.     [provider]  oxyCODONE (OXY IR/ROXICODONE) 5 MG immediate release tablet Take 1 tablet (5 mg total) by mouth every 4 (four) hours as needed for up to 3 days for moderate pain. SNF use only 05/16/21 05/19/21   Sreenath, Sudheer B, MD  polyethylene glycol (MIRALAX / GLYCOLAX) packet Take 17 g by mouth every other day.     [provider]  promethazine (PHENERGAN) 25 MG suppository Place 25 mg rectally every 6 (six) hours as needed for nausea or vomiting.    [provider]  tamsulosin (FLOMAX) 0.4 MG CAPS capsule Take 1 capsule (0.4 mg total) by mouth daily. 05/17/21   Sidney Ace, MD  venlafaxine (EFFEXOR) 75 MG tablet Take 1 tablet (75 mg total) by mouth 2 (two) times daily. 10/10/18   Medina-Vargas, Monina C, NP  vitamin C (ASCORBIC ACID) 500 MG tablet Take 500 mg by mouth 2 (two) times daily.     [provider]  VYZULTA 0.024 % SOLN Place 1 drop into the right eye daily. 02/05/21   [provider]   Physical Exam: Vitals:   05/16/21 1946 05/16/21 1949  BP: (!) 132/52   Pulse: 97   Resp: 17   Temp: 98.5 F (36.9 C)   TempSrc: Oral   SpO2: 98%   Weight:  51.3 kg  Height:  5\' 2"  (1.575 m)   Constitutional: appears age-appropriate, NAD, calm, comfortable Eyes: PERRL, lids and conjunctivae normal ENMT: Mucous membranes are moist. Posterior pharynx clear of any exudate or lesions. Age-appropriate dentition. Hearing appropriate Neck: normal, supple, no masses, no thyromegaly Respiratory: clear to auscultation bilaterally, no wheezing, no crackles. Normal respiratory effort. No accessory muscle use.  Cardiovascular: Regular rate and rhythm, no murmurs / rubs / gallops. No extremity edema. 2+ pedal pulses. No carotid bruits.  Abdomen: no tenderness, no masses palpated, no hepatosplenomegaly. Bowel sounds positive.  Musculoskeletal: no clubbing / cyanosis. No joint deformity upper and lower extremities. Good ROM, no contractures, no atrophy. Normal muscle tone.  Dressing and brace in the left upper extremity in place. Skin: no rashes, lesions, ulcers. No induration. + pale Neurologic: Sensation intact. Strength 5/5 in all 4.  Psychiatric: Normal judgment  and insight. Alert and oriented x 3. Normal mood.   EKG: Not indicated  Chest  x-ray on Admission: Not indicated  Labs on Admission: I have personally reviewed following labs  CBC: Recent Labs  Lab 05/16/21 0214 05/16/21 2022  WBC 7.7 9.0  NEUTROABS  --  6.6  HGB 8.0* 7.1*  HCT 22.7* 20.3*  MCV 91.5 91.4  PLT 361 353*   Basic Metabolic Panel: Recent Labs  Lab 05/10/21 0406 05/11/21 0334 05/12/21 0456 05/13/21 0420 05/16/21 2022  NA 131* 128* 124* 128* 126*  K 3.9 4.2 4.3 4.7 4.7  CL 97* 94* 90* 96* 94*  CO2 26 24 24 24 24   GLUCOSE 166* 291* 271* 243* 200*  BUN 15 13 17 14 19   CREATININE 0.52 0.59 0.56 0.59 0.60  CALCIUM 8.5* 8.1* 8.3* 7.6* 8.3*   GFR: Estimated Creatinine Clearance: 46.6 mL/min (by C-G formula based on SCr of 0.6 mg/dL).  Coagulation Profile: Recent Labs  Lab 05/16/21 2022  INR 1.0   CBG: Recent Labs  Lab 05/15/21 1657 05/15/21 2117 05/15/21 2337 05/16/21 0833 05/16/21 1139  GLUCAP 162* 85 145* 213* 242*   Urine analysis:    Component Value Date/Time   COLORURINE YELLOW 05/08/2021 0430   APPEARANCEUR CLEAR 05/08/2021 0430   APPEARANCEUR Clear 10/19/2012 1628   LABSPEC 1.020 05/08/2021 0430   LABSPEC 1.017 10/19/2012 1628   PHURINE 5.5 05/08/2021 0430   GLUCOSEU 500 (A) 05/08/2021 0430   GLUCOSEU >=500 10/19/2012 1628   HGBUR NEGATIVE 05/08/2021 0430   BILIRUBINUR NEGATIVE 05/08/2021 0430   BILIRUBINUR Negative 10/19/2012 1628   KETONESUR 15 (A) 05/08/2021 0430   PROTEINUR NEGATIVE 05/08/2021 0430   NITRITE NEGATIVE 05/08/2021 0430   LEUKOCYTESUR NEGATIVE 05/08/2021 0430   LEUKOCYTESUR Negative 10/19/2012 1628   Dr. Tobie Poet [facility]  If 7PM-7AM, please contact overnight-coverage provider If 7AM-7PM, please contact day coverage provider [email or web address]  05/16/2021, 9:35 PM

## 2021-05-16 NOTE — Progress Notes (Signed)
   05/16/21 1325  Clinical Encounter Type  Visited With Patient  Visit Type Initial  Referral From Chaplain  Consult/Referral To [place] visited pt and provided a ministry of presence and emotional support. Pt was sitting up in her chair eating a late lunch. Pt doing well after her fall and she is looking forward to discharging today.

## 2021-05-16 NOTE — Progress Notes (Signed)
Report given to transport personnel, patient discharged to [facility].

## 2021-05-16 NOTE — Care Management Important Message (Signed)
Important Message  Patient Details  Name: Erin Daniels MRN: [id number] Date of Birth: 03/27/1946   Medicare Important Message Given:  Yes     [name], [name] 05/16/2021, 12:58 PM

## 2021-05-17 DIAGNOSIS — D649 Anemia, unspecified: Secondary | ICD-10-CM | POA: Diagnosis not present

## 2021-05-17 LAB — GLUCOSE, CAPILLARY
Glucose-Capillary: 267 mg/dL — ABNORMAL HIGH (ref 70–99)
Glucose-Capillary: 302 mg/dL — ABNORMAL HIGH (ref 70–99)
Glucose-Capillary: 144 mg/dL — ABNORMAL HIGH (ref 70–99)
Glucose-Capillary: 316 mg/dL — ABNORMAL HIGH (ref 70–99)
Glucose-Capillary: 138 mg/dL — ABNORMAL HIGH (ref 70–99)

## 2021-05-17 LAB — BASIC METABOLIC PANEL
Anion gap: 6 (ref 5–15)
Anion gap: 11 (ref 5–15)
BUN: 17 mg/dL (ref 8–23)
BUN: 15 mg/dL (ref 8–23)
CO2: 26 mmol/L (ref 22–32)
CO2: 23 mmol/L (ref 22–32)
Calcium: 7.9 mg/dL — ABNORMAL LOW (ref 8.9–10.3)
Calcium: 8.2 mg/dL — ABNORMAL LOW (ref 8.9–10.3)
Chloride: 95 mmol/L — ABNORMAL LOW (ref 98–111)
Chloride: 97 mmol/L — ABNORMAL LOW (ref 98–111)
Creatinine, Ser: 0.67 mg/dL (ref 0.44–1.00)
Creatinine, Ser: 0.52 mg/dL (ref 0.44–1.00)
GFR, Estimated: >60 mL/min (ref >60)
GFR, Estimated: >60 mL/min (ref >60)
Glucose, Bld: 262 mg/dL — ABNORMAL HIGH (ref 70–99)
Glucose, Bld: 301 mg/dL — ABNORMAL HIGH (ref 70–99)
Potassium: 4.5 mmol/L (ref 3.5–5.1)
Potassium: 4.7 mmol/L (ref 3.5–5.1)
Sodium: 127 mmol/L — ABNORMAL LOW (ref 135–145)
Sodium: 131 mmol/L — ABNORMAL LOW (ref 135–145)

## 2021-05-17 LAB — CBC
HCT: 18.4 % — ABNORMAL LOW (ref 36.0–46.0)
HCT: 23.7 % — ABNORMAL LOW (ref 36.0–46.0)
Hemoglobin: 6.4 g/dL — ABNORMAL LOW (ref 12.0–15.0)
Hemoglobin: 8.6 g/dL — ABNORMAL LOW (ref 12.0–15.0)
MCH: 32.2 pg (ref 26.0–34.0)
MCH: 32.2 pg (ref 26.0–34.0)
MCHC: 34.8 g/dL (ref 30.0–36.0)
MCHC: 36.3 g/dL — ABNORMAL HIGH (ref 30.0–36.0)
MCV: 88.8 fL (ref 80.0–100.0)
MCV: 92.5 fL (ref 80.0–100.0)
Platelets: 354 10*3/uL (ref 150–400)
Platelets: 355 10*3/uL (ref 150–400)
RBC: 1.99 MIL/uL — ABNORMAL LOW (ref 3.87–5.11)
RBC: 2.67 MIL/uL — ABNORMAL LOW (ref 3.87–5.11)
RDW: 12.1 % (ref 11.5–15.5)
RDW: 13.1 % (ref 11.5–15.5)
WBC: 7.7 10*3/uL (ref 4.0–10.5)
WBC: 7.7 10*3/uL (ref 4.0–10.5)
nRBC: 0 % (ref 0.0–0.2)
nRBC: 0 % (ref 0.0–0.2)

## 2021-05-17 LAB — PREPARE RBC (CROSSMATCH)

## 2021-05-17 MED ORDER — OXYCODONE HCL 5 MG PO TABS
5.0000 mg | ORAL_TABLET | ORAL | Status: DC | PRN
  Administered 2021-05-17 – 2021-05-18 (×5): 5 mg via ORAL
  Filled 2021-05-17 (×5): qty 1

## 2021-05-17 MED ORDER — FENTANYL CITRATE (PF) 100 MCG/2ML IJ SOLN
25.0000 ug | INTRAMUSCULAR | Status: DC | PRN
  Administered 2021-05-17: 25 ug via INTRAVENOUS
  Filled 2021-05-17: qty 2

## 2021-05-17 MED ORDER — GABAPENTIN 300 MG PO CAPS
900.0000 mg | ORAL_CAPSULE | Freq: Every day | ORAL | Status: DC
  Administered 2021-05-17: 900 mg via ORAL
  Filled 2021-05-17: qty 3

## 2021-05-17 MED ORDER — INSULIN GLARGINE-YFGN 100 UNIT/ML ~~LOC~~ SOLN
11.0000 [IU] | Freq: Every day | SUBCUTANEOUS | Status: DC
  Administered 2021-05-17 – 2021-05-18 (×2): 11 [IU] via SUBCUTANEOUS
  Filled 2021-05-17 (×3): qty 0.11

## 2021-05-17 MED ORDER — INSULIN ASPART 100 UNIT/ML IJ SOLN
0.0000 [IU] | Freq: Three times a day (TID) | INTRAMUSCULAR | Status: DC
  Administered 2021-05-17: 11 [IU] via SUBCUTANEOUS
  Filled 2021-05-17: qty 1

## 2021-05-17 MED ORDER — INSULIN ASPART 100 UNIT/ML IJ SOLN: 0.0000 [IU] | Freq: Every day | INTRAMUSCULAR | Status: DC

## 2021-05-17 MED ORDER — GABAPENTIN 300 MG PO CAPS
300.0000 mg | ORAL_CAPSULE | Freq: Two times a day (BID) | ORAL | Status: DC
  Administered 2021-05-17 – 2021-05-18 (×4): 300 mg via ORAL
  Filled 2021-05-17 (×4): qty 1

## 2021-05-17 MED ORDER — INSULIN ASPART 100 UNIT/ML IJ SOLN
4.0000 [IU] | Freq: Three times a day (TID) | INTRAMUSCULAR | Status: DC
  Administered 2021-05-17 – 2021-05-18 (×3): 4 [IU] via SUBCUTANEOUS
  Filled 2021-05-17 (×3): qty 1

## 2021-05-17 MED ORDER — INSULIN ASPART 100 UNIT/ML IJ SOLN
0.0000 [IU] | Freq: Three times a day (TID) | INTRAMUSCULAR | Status: DC
  Administered 2021-05-17: 4 [IU] via SUBCUTANEOUS
  Administered 2021-05-18: 2 [IU] via SUBCUTANEOUS
  Filled 2021-05-17 (×2): qty 1

## 2021-05-17 MED ORDER — POLYETHYLENE GLYCOL 3350 17 G PO PACK: 17.0000 g | PACK | ORAL | Status: DC

## 2021-05-17 MED ORDER — TAMSULOSIN HCL 0.4 MG PO CAPS
0.4000 mg | ORAL_CAPSULE | Freq: Every day | ORAL | Status: DC
  Administered 2021-05-17 – 2021-05-18 (×2): 0.4 mg via ORAL
  Filled 2021-05-17 (×2): qty 1

## 2021-05-17 MED ORDER — SODIUM CHLORIDE 0.9 % IV SOLN: INTRAVENOUS | Status: DC

## 2021-05-17 NOTE — Progress Notes (Signed)
Met with the patient to discuss DC plan and care and spoke to her Husband on the phone, She went to SNF yesterday at DC and came back to the hospital She does not want to return to SNF, She is agreeable to have [facility] services thru [facility] and will need a Hemi walker, it will be delivered to the room prior to DC by [facility] Her husband will provide transportation, no additional needs

## 2021-05-17 NOTE — Progress Notes (Signed)
Subjective: Patient is postop day 5 left olecranon ORIF.  Discharged yesterday to rehab facility, had bleeding through the dressing.  She reported to the ER last night where she was found to be anemic.  1 unit of packed red blood cells was given.  She appeared to have some pulsatile bleeding coming from the incision site, combat gauze was applied along with a compression dressing.  This morning patient is doing well.  She has a very light dressing along with no drainage overnight.    Objective: Vital signs in last 24 hours: Temp:  [97.9 F (36.6 C)-98.5 F (36.9 C)] 98 F (36.7 C) (09/27 0802) Pulse Rate:  [72-102] 102 (09/27 0802) Resp:  [14-18] 14 (09/27 0802) BP: (132-169)/(52-78) 169/70 (09/27 0802) SpO2:  [94 %-100 %] 96 % (09/27 0802) Weight:  [51.3 kg] 51.3 kg (09/26 1949)  Intake/Output from previous day: 09/26 0701 - 09/27 0700 In: 640 [Blood:640] Out: -  Intake/Output this shift: Total I/O In: -  Out: 1400 [Urine:1400]  Recent Labs    05/16/21 0214 05/16/21 2022 05/17/21 0043 05/17/21 0637  HGB 8.0* 7.1* 6.4* 8.6*   Recent Labs    05/17/21 0043 05/17/21 0637  WBC 7.7 7.7  RBC 1.99* 2.67*  HCT 18.4* 23.7*  PLT 355 354   Recent Labs    05/17/21 0043 05/17/21 0637  NA 127* 131*  K 4.5 4.7  CL 95* 97*  CO2 26 23  BUN 17 15  CREATININE 0.67 0.52  GLUCOSE 262* 301*  CALCIUM 7.9* 8.2*   Recent Labs    05/16/21 2022  INR 1.0    EXAM General - Patient is Alert, Appropriate, and Oriented Extremity - Neurovascular intact Sensation intact distally Dressing - dressing C/D/I, long arm splint intact.  Dressing was stripped down, staples are intact.  There is no significant swelling, effusion.  There is no active bleeding.  Patient is neurovascular intact in left upper extremity.  Combat gauze was reapplied along with some Kerlix and Coban for slight compression.  Cast padding along with posterior splint was reapplied along with Ace wrap.  Patient is  neurovascular intact post splint application. Motor Function - intact, moving foot and toes well on exam.   Past Medical History:  Diagnosis Date   Angina pectoris ([place])    At high risk for falls    Cancer [facility])    Carpal tunnel syndrome    BILATERAL   Cervicalgia    Complication of anesthesia    difficult to arouse   Diabetes mellitus without complication (HCC)    Type I   Diabetic neuropathy (HCC)    Diabetic neuropathy (HCC)    Diabetic retinopathy ([place])    Diabetic retinopathy ([place])    Dizziness    Gastroparesis    Headache    MIGRAINE   Herniated disc, cervical    Hypercholesteremia    Hypertension    Hypokalemia    Hypotensive episode    Hypothyroidism    Insulin pump in place    Myofascial muscle pain    Numbness of arm    left   Osteoporosis    Poor vision    Rosacea    Seizures (HCC)    Slurred speech    Stroke (cerebrum) ([place])    4/19   Stroke ([place])    x5   Vertigo    Wears glasses     Assessment/Plan:      Principal Problem:   Acute anemia Active Problems:   Essential  hypertension   Depression   Chronic pain syndrome   Chronic cerebrovascular accident (CVA)   Constipation   Anemia of chronic disease   Chronic migraine without aura   Bilateral sacral insufficiency fracture   Subarachnoid hematoma (HCC)   HLD (hyperlipidemia)   Hypothyroidism   Thrombocytosis  Estimated body mass index is 20.67 kg/m as calculated from the following:   Height as of this encounter: 5\' 2"  (1.575 m).   Weight as of this encounter: 51.3 kg.   Acute postop blood loss anemia -status post 1 unit packed red blood cells 05/16/2021.  Hemoglobin 8.6.  Postoperative drainage from left elbow -combat gauze along with pressure dressing last night in the ER appears to have a stop the bleeding.  Dressing was removed this morning and there was very little drainage on the gauze, dressing was dry and wound currently showing no active bleeding.  Combat gauze was reapplied  along with compressive dressing, splint.  Do not see any indications to take patient back to the OR at this time.  Drainage appears to have stopped.  Patient will need to follow-up with [facility] orthopedics end of this week for recheck. Continue with splint to left upper extremity.  Nonweightbearing left upper extremity.  Care management to assist with discharge pending medical clearance      T. Rachelle Hora, PA-C [place] 05/17/2021, 10:15 AM

## 2021-05-17 NOTE — Progress Notes (Signed)
PROGRESS NOTE    Erin Daniels  KGY:[id number] DOB: 01-Sep-1945 DOA: 05/16/2021 PCP: Erin Pitch, MD   Brief Narrative: 75 y.o. female with medical history significant for insulin-dependent diabetes mellitus, history of CVA, diabetic retinopathy, hypertension, hypothyroid, who presents to the emergency department from [facility] for chief concerns of left arm bleeding.   Patient states that the bleeding has been continuous and facility was unable to stop it.  Of note she was discharged from the hospital on same day as hospital admission.   At bedside she is able to tell me her name, age, current location of hospital, current calendar year.  She had 2+ pulses in bilateral radial, bilateral pedal pulses.  There is no visual signs of exsanguination.  Patient appears comfortable, not in acute distress.  She states that since she fell, her arm hurts.  She denies trauma to her arm since being discharged.  She denies fever, chest pain, shortness of breath, abdominal pain, dysuria, nausea, vomiting.  Patient received 1 unit PRBC transfusion.  Hemoglobin stable afterwards.  Left upper extremity in surgical wraps.  Orthopedic surgery made aware of patient admission.   Assessment & Plan:   Principal Problem:   Acute anemia Active Problems:   Essential hypertension   Depression   Chronic pain syndrome   Chronic cerebrovascular accident (CVA)   Constipation   Anemia of chronic disease   Chronic migraine without aura   Bilateral sacral insufficiency fracture   Subarachnoid hematoma (HCC)   HLD (hyperlipidemia)   Hypothyroidism   Thrombocytosis  Acute blood loss anemia Postoperative bleeding Patient had ORIF left upper extremity previous hospitalization Had some issues with bleeding and saturated dressing Dressing reinforced prior to discharge On arrival to skilled nursing facility staff was unable to stop bleeding Per EDP there was evidence of pulsatile bleed under  dressing Orthopedic surgery, Dr. Rudene Daniels and Dr. Roland Daniels are aware Received 1 unit PRBC on arrival Plan: N.p.o. Follow hemoglobin Continue dressing Orthopedic consult, possible back to the OR for surgical exploration  History of CAD patient's last dose of Plavix was on 05/15/2021 at 1717 -Patient did not receive her Plavix dose on 05/16/2021 Plan: Continue to hold Plavix   Insulin-dependent diabetes mellitus patient is on a pump at home.  Pump is been held while inpatient - A1c on 05/07/2021 was 6.6  Plan: Basal bolus regimen Carb modified diet when advanced Diabetes coordinator following   Diabetic neuropathy resumed home gabapentin per home dosing   Thrombocytosis  presumed reactive secondary to acute anemia bleed -CBC in the a.m.   Hyperlipidemia atorvastatin 40 mg daily presumed   Hypertension losartan 100 mg daily resumed   Anxiety/depression venlafaxine 75 mg p.o. twice daily resumed   Discharge planning At bedside patient was able to flex and extend her knee without difficulty and exhibited appropriate strength - She endorsed to me that she did not want to go to facility and that she wanted to go home.  She feels that her husband can help her at home given that she can walk.   DVT prophylaxis: SCD Code Status: Full Family Communication: Husband Erin Daniels [phone] on 9/27 Disposition Plan: Status is: Observation  The patient will require care spanning > 2 midnights and should be moved to inpatient because: Inpatient level of care appropriate due to severity of illness  Dispo: The patient is from: SNF              Anticipated d/c is to: Home  Patient currently is not medically stable to d/c.   Difficult to place patient No  Bleeding from surgical site.  Postoperative blood loss anemia.  Pending orthopedic follow-up regarding surgical intervention.  If no surgical invention warranted will monitor in hospital overnight.  Check a.m. CBC.  Discharge  planning in a.m. if stable.  Patient wants to go home and not back to skilled nursing facility.     Level of care: Med-Surg  Consultants:  Orthopedics  Procedures:  None  Antimicrobials:  None   Subjective: Seen and examined.  Resting in bed comfortably.  No visible distress.  Does Dors pain in left upper extremity  Objective: Vitals:   05/17/21 0355 05/17/21 0356 05/17/21 0529 05/17/21 0802  BP: (!) 164/61 (!) 154/61 (!) 169/78 (!) 169/70  Pulse: 94 94 91 (!) 102  Resp: 16 16 16 14   Temp: 98.3 F (36.8 C) 98.3 F (36.8 C) 98.1 F (36.7 C) 98 F (36.7 C)  TempSrc: Axillary Axillary Axillary   SpO2: 97% 97% 100% 96%  Weight:      Height:        Intake/Output Summary (Last 24 hours) at 05/17/2021 1015 Last data filed at 05/17/2021 0951 Gross per 24 hour  Intake 640 ml  Output 1400 ml  Net -760 ml   Filed Weights   05/16/21 1949  Weight: 51.3 kg    Examination:  General exam: No acute distress.  Appears frail Respiratory system: Clear to auscultation. Respiratory effort normal. Cardiovascular system: S1-S2, RRR, no murmurs Gastrointestinal system: Soft, NT/ND, normal bowel sounds Central nervous system: Alert and oriented. No focal neurological deficits. Extremities: Left upper extremity in surgical wraps Skin: No rashes, lesions or ulcers Psychiatry: Judgement and insight appear normal. Mood & affect appropriate.     Data Reviewed: I have personally reviewed following labs and imaging studies  CBC: Recent Labs  Lab 05/16/21 0214 05/16/21 2022 05/17/21 0043 05/17/21 0637  WBC 7.7 9.0 7.7 7.7  NEUTROABS  --  6.6  --   --   HGB 8.0* 7.1* 6.4* 8.6*  HCT 22.7* 20.3* 18.4* 23.7*  MCV 91.5 91.4 92.5 88.8  PLT 361 425* 355 528   Basic Metabolic Panel: Recent Labs  Lab 05/12/21 0456 05/13/21 0420 05/16/21 2022 05/17/21 0043 05/17/21 0637  NA 124* 128* 126* 127* 131*  K 4.3 4.7 4.7 4.5 4.7  CL 90* 96* 94* 95* 97*  CO2 24 24 24 26 23   GLUCOSE  271* 243* 200* 262* 301*  BUN 17 14 19 17 15   CREATININE 0.56 0.59 0.60 0.67 0.52  CALCIUM 8.3* 7.6* 8.3* 7.9* 8.2*   GFR: Estimated Creatinine Clearance: 46.6 mL/min (by C-G formula based on SCr of 0.52 mg/dL). Liver Function Tests: No results for input(s): AST, ALT, ALKPHOS, BILITOT, PROT, ALBUMIN in the last 168 hours. No results for input(s): LIPASE, AMYLASE in the last 168 hours. No results for input(s): AMMONIA in the last 168 hours. Coagulation Profile: Recent Labs  Lab 05/16/21 2022  INR 1.0   Cardiac Enzymes: No results for input(s): CKTOTAL, CKMB, CKMBINDEX, TROPONINI in the last 168 hours. BNP (last 3 results) No results for input(s): PROBNP in the last 8760 hours. HbA1C: No results for input(s): HGBA1C in the last 72 hours. CBG: Recent Labs  Lab 05/15/21 2337 05/16/21 0833 05/16/21 1139 05/17/21 0216 05/17/21 0803  GLUCAP 145* 213* 242* 267* 302*   Lipid Profile: No results for input(s): CHOL, HDL, LDLCALC, TRIG, CHOLHDL, LDLDIRECT in the last 72 hours. Thyroid Function  Tests: No results for input(s): TSH, T4TOTAL, FREET4, T3FREE, THYROIDAB in the last 72 hours. Anemia Panel: No results for input(s): VITAMINB12, FOLATE, FERRITIN, TIBC, IRON, RETICCTPCT in the last 72 hours. Sepsis Labs: No results for input(s): PROCALCITON, LATICACIDVEN in the last 168 hours.  Recent Results (from the past 240 hour(s))  Resp Panel by RT-PCR (Flu A&B, Covid) Nasopharyngeal Swab     Status: None   Collection Time: 05/07/21  3:53 PM   Specimen: Nasopharyngeal Swab; Nasopharyngeal(NP) swabs in vial transport medium  Result Value Ref Range Status   SARS Coronavirus 2 by RT PCR NEGATIVE NEGATIVE Final    Comment: (NOTE) SARS-CoV-2 target nucleic acids are NOT DETECTED.  The SARS-CoV-2 RNA is generally detectable in upper respiratory specimens during the acute phase of infection. The lowest concentration of SARS-CoV-2 viral copies this assay can detect is 138 copies/mL. A  negative result does not preclude SARS-Cov-2 infection and should not be used as the sole basis for treatment or other patient management decisions. A negative result may occur with  improper specimen collection/handling, submission of specimen other than nasopharyngeal swab, presence of viral mutation(s) within the areas targeted by this assay, and inadequate number of viral copies(<138 copies/mL). A negative result must be combined with clinical observations, patient history, and epidemiological information. The expected result is Negative.  Fact Sheet for Patients:  EntrepreneurPulse.com.au  Fact Sheet for Healthcare Providers:  IncredibleEmployment.be  This test is no t yet approved or cleared by the [place] FDA and  has been authorized for detection and/or diagnosis of SARS-CoV-2 by FDA under an Emergency Use Authorization (EUA). This EUA will remain  in effect (meaning this test can be used) for the duration of the COVID-19 declaration under Section 564(b)(1) of the Act, 21 U.S.C.section 360bbb-3(b)(1), unless the authorization is terminated  or revoked sooner.       Influenza A by PCR NEGATIVE NEGATIVE Final   Influenza B by PCR NEGATIVE NEGATIVE Final    Comment: (NOTE) The Xpert Xpress SARS-CoV-2/FLU/RSV plus assay is intended as an aid in the diagnosis of influenza from Nasopharyngeal swab specimens and should not be used as a sole basis for treatment. Nasal washings and aspirates are unacceptable for Xpert Xpress SARS-CoV-2/FLU/RSV testing.  Fact Sheet for Patients: EntrepreneurPulse.com.au  Fact Sheet for Healthcare Providers: IncredibleEmployment.be  This test is not yet approved or cleared by the [place] FDA and has been authorized for detection and/or diagnosis of SARS-CoV-2 by FDA under an Emergency Use Authorization (EUA). This EUA will remain in effect (meaning this test can  be used) for the duration of the COVID-19 declaration under Section 564(b)(1) of the Act, 21 U.S.C. section 360bbb-3(b)(1), unless the authorization is terminated or revoked.  Performed at [facility], [address], [place], [place] [address]   Urine Culture     Status: None   Collection Time: 05/08/21  4:30 AM   Specimen: Urine, Clean Catch  Result Value Ref Range Status   Specimen Description   Final    URINE, CLEAN CATCH Performed at [facility], [address] [place], [place] [address]    Special Requests   Final    NONE Performed at [facility], [address], [place], [place] [address]    Culture   Final    NO GROWTH Performed at [facility], [place] [address], [place], [place] [address]    Report Status 05/09/2021 FINAL  Final  Resp Panel by RT-PCR (Flu A&B, Covid) Nasopharyngeal  Swab     Status: None   Collection Time: 05/16/21  9:00 AM   Specimen: Nasopharyngeal Swab; Nasopharyngeal(NP) swabs in vial transport medium  Result Value Ref Range Status   SARS Coronavirus 2 by RT PCR NEGATIVE NEGATIVE Final    Comment: (NOTE) SARS-CoV-2 target nucleic acids are NOT DETECTED.  The SARS-CoV-2 RNA is generally detectable in upper respiratory specimens during the acute phase of infection. The lowest concentration of SARS-CoV-2 viral copies this assay can detect is 138 copies/mL. A negative result does not preclude SARS-Cov-2 infection and should not be used as the sole basis for treatment or other patient management decisions. A negative result may occur with  improper specimen collection/handling, submission of specimen other than nasopharyngeal swab, presence of viral mutation(s) within the areas targeted by this assay, and inadequate number of viral copies(<138 copies/mL). A negative result must be combined with clinical observations, patient history, and epidemiological information. The expected result is Negative.  Fact Sheet  for Patients:  EntrepreneurPulse.com.au  Fact Sheet for Healthcare Providers:  IncredibleEmployment.be  This test is no t yet approved or cleared by the [place] FDA and  has been authorized for detection and/or diagnosis of SARS-CoV-2 by FDA under an Emergency Use Authorization (EUA). This EUA will remain  in effect (meaning this test can be used) for the duration of the COVID-19 declaration under Section 564(b)(1) of the Act, 21 U.S.C.section 360bbb-3(b)(1), unless the authorization is terminated  or revoked sooner.       Influenza A by PCR NEGATIVE NEGATIVE Final   Influenza B by PCR NEGATIVE NEGATIVE Final    Comment: (NOTE) The Xpert Xpress SARS-CoV-2/FLU/RSV plus assay is intended as an aid in the diagnosis of influenza from Nasopharyngeal swab specimens and should not be used as a sole basis for treatment. Nasal washings and aspirates are unacceptable for Xpert Xpress SARS-CoV-2/FLU/RSV testing.  Fact Sheet for Patients: EntrepreneurPulse.com.au  Fact Sheet for Healthcare Providers: IncredibleEmployment.be  This test is not yet approved or cleared by the [place] FDA and has been authorized for detection and/or diagnosis of SARS-CoV-2 by FDA under an Emergency Use Authorization (EUA). This EUA will remain in effect (meaning this test can be used) for the duration of the COVID-19 declaration under Section 564(b)(1) of the Act, 21 U.S.C. section 360bbb-3(b)(1), unless the authorization is terminated or revoked.  Performed at [facility], [address], [place], [place] [address]          Radiology Studies: No results found.      Scheduled Meds:  atorvastatin  40 mg Oral Daily   gabapentin  300 mg Oral BID WC   gabapentin  900 mg Oral QHS   insulin aspart  0-5 Units Subcutaneous QHS   insulin aspart  0-6 Units Subcutaneous TID WC   insulin aspart  4 Units  Subcutaneous TID WC   insulin glargine-yfgn  11 Units Subcutaneous Daily   losartan  100 mg Oral Daily   polyethylene glycol  17 g Oral QODAY   tamsulosin  0.4 mg Oral Daily   venlafaxine  75 mg Oral BID   Continuous Infusions:  sodium chloride 100 mL/hr at 05/17/21 0220     LOS: 0 days    Time spent: 35 minutes    Sidney Ace, MD [facility] Pager [phone]  If 7PM-7AM, please contact night-coverage 05/17/2021, 10:15 AM

## 2021-05-17 NOTE — Progress Notes (Signed)
Inpatient Diabetes Program Recommendations  AACE/ADA: New Consensus Statement on Inpatient Glycemic Control (2015)  Target Ranges:  Prepandial:   less than 140 mg/dL      Peak postprandial:   less than 180 mg/dL (1-2 hours)      Critically ill patients:  140 - 180 mg/dL  Results for Erin Daniels, Erin Daniels (MRN [id number]) as of 05/17/2021 09:37  Ref. Range 05/17/2021 02:16 05/17/2021 08:03  Glucose-Capillary Latest Ref Range: 70 - 99 mg/dL 267 (H) 302 (H)    History: Type 1 diabetes (makes NO insulin; requires basal, correction, and carb coverage insulin)   Home DM Meds: Omni Pod insulin pump with Novolog and Dexcom CGM  Current Orders: Novolog Moderate Correction Scale/ SSI (0-15 units) TID AC + HS     Sent secure chat to RN caring of pt this AM--Home Insulin pump is OFF patient per RN.  CBG 302 this AM.  Since pt has Type 1 diabetes, will need basal insulin while off pump.  MD- Please consider:  1. Start Semglee 11 Daily (please start this AM)  2. Start Novolog 0-6 units TID AC + HS  3. Start Novolog 4 units TID with meals for Meal Coverage    --Will follow patient during hospitalization--  Wyn Quaker RN, MSN, CDE Diabetes Coordinator Inpatient Glycemic Control Team Team Pager: [phone] (8a-5p)

## 2021-05-18 ENCOUNTER — Encounter: Payer: Self-pay | Admitting: Internal Medicine

## 2021-05-18 DIAGNOSIS — D649 Anemia, unspecified: Secondary | ICD-10-CM | POA: Diagnosis not present

## 2021-05-18 LAB — GLUCOSE, CAPILLARY
Glucose-Capillary: 238 mg/dL — ABNORMAL HIGH (ref 70–99)
Glucose-Capillary: 126 mg/dL — ABNORMAL HIGH (ref 70–99)

## 2021-05-18 LAB — BASIC METABOLIC PANEL
Anion gap: 8 (ref 5–15)
BUN: 13 mg/dL (ref 8–23)
CO2: 26 mmol/L (ref 22–32)
Calcium: 8.4 mg/dL — ABNORMAL LOW (ref 8.9–10.3)
Chloride: 99 mmol/L (ref 98–111)
Creatinine, Ser: 0.61 mg/dL (ref 0.44–1.00)
GFR, Estimated: >60 mL/min (ref >60)
Glucose, Bld: 229 mg/dL — ABNORMAL HIGH (ref 70–99)
Potassium: 4.4 mmol/L (ref 3.5–5.1)
Sodium: 133 mmol/L — ABNORMAL LOW (ref 135–145)

## 2021-05-18 LAB — CBC WITH DIFFERENTIAL/PLATELET
Abs Immature Granulocytes: 0.04 10*3/uL (ref 0.00–0.07)
Basophils Absolute: 0.1 10*3/uL (ref 0.0–0.1)
Basophils Relative: 1 %
Eosinophils Absolute: 0.4 10*3/uL (ref 0.0–0.5)
Eosinophils Relative: 5 %
HCT: 25 % — ABNORMAL LOW (ref 36.0–46.0)
Hemoglobin: 8.9 g/dL — ABNORMAL LOW (ref 12.0–15.0)
Immature Granulocytes: 1 %
Lymphocytes Relative: 16 %
Lymphs Abs: 1.3 10*3/uL (ref 0.7–4.0)
MCH: 32.1 pg (ref 26.0–34.0)
MCHC: 35.6 g/dL (ref 30.0–36.0)
MCV: 90.3 fL (ref 80.0–100.0)
Monocytes Absolute: 0.9 10*3/uL (ref 0.1–1.0)
Monocytes Relative: 11 %
Neutro Abs: 5.6 10*3/uL (ref 1.7–7.7)
Neutrophils Relative %: 66 %
Platelets: 396 10*3/uL (ref 150–400)
RBC: 2.77 MIL/uL — ABNORMAL LOW (ref 3.87–5.11)
RDW: 13.2 % (ref 11.5–15.5)
WBC: 8.2 10*3/uL (ref 4.0–10.5)
nRBC: 0 % (ref 0.0–0.2)

## 2021-05-18 LAB — TYPE AND SCREEN
ABO/RH(D): O POS
Antibody Screen: NEGATIVE
Unit division: 0

## 2021-05-18 LAB — BPAM RBC
Blood Product Expiration Date: 202210302359
ISSUE DATE / TIME: 202209270336
Unit Type and Rh: 5100

## 2021-05-18 NOTE — Discharge Summary (Signed)
Physician Discharge Summary  Erin Daniels DJS:[id number] DOB: 1946/06/11 DOA: 05/16/2021  PCP: Juluis Pitch, MD  Admit date: 05/16/2021 Discharge date: 05/18/2021  Discharge disposition: Home with home health therapy   Recommendations for Outpatient Follow-Up:   Follow-up with orthopedic surgery team on 05/20/2021  Discharge Diagnosis:   Principal Problem:   Acute anemia Active Problems:   Essential hypertension   Depression   Chronic pain syndrome   Chronic cerebrovascular accident (CVA)   Constipation   Anemia of chronic disease   Chronic migraine without aura   Bilateral sacral insufficiency fracture   Subarachnoid hematoma (HCC)   HLD (hyperlipidemia)   Hypothyroidism   Thrombocytosis    Discharge Condition: Stable.  Diet recommendation:  Diet Order             Diet - low sodium heart healthy           Diet Carb Modified Fluid consistency: Thin; Room service appropriate? Yes  Diet effective now                     Code Status: Full Code     Hospital Course:   Ms. [name] is a 75 year old woman with medical history significant for recent ORIF left olecranon and T10 kyphoplasty on 05/12/2021, insulin-dependent diabetes mellitus, history of stroke, diabetic retinopathy, hypertension, hypothyroidism, who presented to the hospital from [place] home, because of bleeding from the left arm.  She was admitted to the hospital for postoperative bleeding from left arm and acute blood loss anemia.  She was evaluated by the orthopedic surgeon.  Dressing on the left upper extremity was changed.  She required 1 unit of packed red blood cells for acute blood loss anemia.  Her condition has improved and she is deemed stable for discharge.  She did not want to go back to the skilled nursing facility.  She wanted to go home with home health therapy.  Her home health therapy was arranged by the case manager.    Medical Consultants:    Orthopedic surgeon   Discharge Exam:    Vitals:   05/17/21 2258 05/18/21 0410 05/18/21 0804 05/18/21 1136  BP: (!) 134/57 (!) 144/64 (!) 150/70 (!) 93/40  Pulse: 84 89 87 83  Resp: 16 14 16 16   Temp: 98.2 F (36.8 C) 98 F (36.7 C) 98.4 F (36.9 C) 98.1 F (36.7 C)  TempSrc:      SpO2: 98% 97% 94% 94%  Weight:      Height:         GEN: NAD SKIN: Warm and dry EYES: No pallor or icterus ENT: MMM CV: RRR PULM: CTA B ABD: soft, ND, NT, +BS CNS: AAO x 3, non focal EXT: Dressing on left upper extremity is clean, dry and intact.  No evidence of active bleeding from the left upper extremity.   The results of significant diagnostics from this hospitalization (including imaging, microbiology, ancillary and laboratory) are listed below for reference.     Procedures and Diagnostic Studies:   No results found.   Labs:   Basic Metabolic Panel: Recent Labs  Lab 05/13/21 0420 05/16/21 2022 05/17/21 0043 05/17/21 0637 05/18/21 0555  NA 128* 126* 127* 131* 133*  K 4.7 4.7 4.5 4.7 4.4  CL 96* 94* 95* 97* 99  CO2 24 24 26 23 26   GLUCOSE 243* 200* 262* 301* 229*  BUN 14 19 17 15 13   CREATININE 0.59 0.60 0.67 0.52 0.61  CALCIUM 7.6*  8.3* 7.9* 8.2* 8.4*   GFR Estimated Creatinine Clearance: 46.6 mL/min (by C-G formula based on SCr of 0.61 mg/dL). Liver Function Tests: No results for input(s): AST, ALT, ALKPHOS, BILITOT, PROT, ALBUMIN in the last 168 hours. No results for input(s): LIPASE, AMYLASE in the last 168 hours. No results for input(s): AMMONIA in the last 168 hours. Coagulation profile Recent Labs  Lab 05/16/21 2022  INR 1.0    CBC: Recent Labs  Lab 05/16/21 0214 05/16/21 2022 05/17/21 0043 05/17/21 0637 05/18/21 0555  WBC 7.7 9.0 7.7 7.7 8.2  NEUTROABS  --  6.6  --   --  5.6  HGB 8.0* 7.1* 6.4* 8.6* 8.9*  HCT 22.7* 20.3* 18.4* 23.7* 25.0*  MCV 91.5 91.4 92.5 88.8 90.3  PLT 361 425* 355 354 396   Cardiac Enzymes: No results for input(s):  CKTOTAL, CKMB, CKMBINDEX, TROPONINI in the last 168 hours. BNP: Invalid input(s): POCBNP CBG: Recent Labs  Lab 05/17/21 1114 05/17/21 1610 05/17/21 2128 05/18/21 0757 05/18/21 1211  GLUCAP 144* 316* 138* 238* 126*   D-Dimer No results for input(s): DDIMER in the last 72 hours. Hgb A1c No results for input(s): HGBA1C in the last 72 hours. Lipid Profile No results for input(s): CHOL, HDL, LDLCALC, TRIG, CHOLHDL, LDLDIRECT in the last 72 hours. Thyroid function studies No results for input(s): TSH, T4TOTAL, T3FREE, THYROIDAB in the last 72 hours.  Invalid input(s): FREET3 Anemia work up No results for input(s): VITAMINB12, FOLATE, FERRITIN, TIBC, IRON, RETICCTPCT in the last 72 hours. Microbiology Recent Results (from the past 240 hour(s))  Resp Panel by RT-PCR (Flu A&B, Covid) Nasopharyngeal Swab     Status: None   Collection Time: 05/16/21  9:00 AM   Specimen: Nasopharyngeal Swab; Nasopharyngeal(NP) swabs in vial transport medium  Result Value Ref Range Status   SARS Coronavirus 2 by RT PCR NEGATIVE NEGATIVE Final    Comment: (NOTE) SARS-CoV-2 target nucleic acids are NOT DETECTED.  The SARS-CoV-2 RNA is generally detectable in upper respiratory specimens during the acute phase of infection. The lowest concentration of SARS-CoV-2 viral copies this assay can detect is 138 copies/mL. A negative result does not preclude SARS-Cov-2 infection and should not be used as the sole basis for treatment or other patient management decisions. A negative result may occur with  improper specimen collection/handling, submission of specimen other than nasopharyngeal swab, presence of viral mutation(s) within the areas targeted by this assay, and inadequate number of viral copies(<138 copies/mL). A negative result must be combined with clinical observations, patient history, and epidemiological information. The expected result is Negative.  Fact Sheet for Patients:   EntrepreneurPulse.com.au  Fact Sheet for Healthcare Providers:  IncredibleEmployment.be  This test is no t yet approved or cleared by the [place] FDA and  has been authorized for detection and/or diagnosis of SARS-CoV-2 by FDA under an Emergency Use Authorization (EUA). This EUA will remain  in effect (meaning this test can be used) for the duration of the COVID-19 declaration under Section 564(b)(1) of the Act, 21 U.S.C.section 360bbb-3(b)(1), unless the authorization is terminated  or revoked sooner.       Influenza A by PCR NEGATIVE NEGATIVE Final   Influenza B by PCR NEGATIVE NEGATIVE Final    Comment: (NOTE) The Xpert Xpress SARS-CoV-2/FLU/RSV plus assay is intended as an aid in the diagnosis of influenza from Nasopharyngeal swab specimens and should not be used as a sole basis for treatment. Nasal washings and aspirates are unacceptable for Xpert Xpress SARS-CoV-2/FLU/RSV testing.  Fact Sheet for Patients: EntrepreneurPulse.com.au  Fact Sheet for Healthcare Providers: IncredibleEmployment.be  This test is not yet approved or cleared by the [place] FDA and has been authorized for detection and/or diagnosis of SARS-CoV-2 by FDA under an Emergency Use Authorization (EUA). This EUA will remain in effect (meaning this test can be used) for the duration of the COVID-19 declaration under Section 564(b)(1) of the Act, 21 U.S.C. section 360bbb-3(b)(1), unless the authorization is terminated or revoked.  Performed at [facility], [address] [place], [place] [address]      Discharge Instructions:   Discharge Instructions     Diet - low sodium heart healthy   Complete by: As directed    Discharge wound care:   Complete by: As directed    Follow up with orthopedic surgeon as scheduled   Increase activity slowly   Complete by: As directed       Allergies as of  05/18/2021       Reactions   Carbocaine [mepivacaine Hcl] Palpitations   Low bp   Hydrocodone Other (See Comments)   Diabetic/  High sugars   Tussionex Pennkinetic Er [hydrocod Polst-cpm Polst Er] Other (See Comments)   Diabetic/  High sugars   Baclofen    confusion Other reaction(s): Hallucinations, Other (See Comments) Mental status changes   Codeine Nausea Only, Other (See Comments)   Reaction: stress attacks/ headache and nausea Can take Vicodin and Percocet    Meperidine Nausea Only, Other (See Comments)   Reaction: stress attacks/ headache and nausea   Metoclopramide Hcl Nausea Only, Other (See Comments)   Reaction: stress attacks/ headache    Stadol [butorphanol] Nausea Only, Other (See Comments)   Reaction: stress attacks/ headache        Medication List     TAKE these medications    acetaminophen 325 MG tablet Commonly known as: TYLENOL Take 2 tablets (650 mg total) by mouth every 6 (six) hours as needed for headache. For mild pain   aspirin EC 81 MG tablet Take 81 mg by mouth daily.   atorvastatin 40 MG tablet Commonly known as: LIPITOR Take 40 mg by mouth daily.   brimonidine-timolol 0.2-0.5 % ophthalmic solution Commonly known as: COMBIGAN Place 1 drop into the right eye 3 (three) times daily. Instill one drop in right eye morning, noon and bedtime   butalbital-acetaminophen-caffeine 50-325-40 MG tablet Commonly known as: FIORICET Take 1 tablet by mouth 2 (two) times daily as needed for up to 3 days for headache or migraine. SNF use onlu   Calcium Carb-Cholecalciferol 600-200 MG-UNIT Tabs Take 1 tablet by mouth daily with supper.   cetirizine 10 MG tablet Commonly known as: ZYRTEC Take 1 tablet (10 mg total) by mouth daily.   clopidogrel 75 MG tablet Commonly known as: PLAVIX Take 75 mg by mouth daily with supper.   ferrous sulfate 325 (65 FE) MG tablet Take 325 mg by mouth daily with supper.   gabapentin 300 MG capsule Commonly known as:  NEURONTIN 300 mg in the morning, 300 mg in the afternoon, 900 mg in the evening What changed:  how much to take how to take this when to take this additional instructions   Glucagon Emergency 1 MG Kit Inject 1 mg into the muscle once.   glucose 4 GM chewable tablet Chew 1 tablet (4 g total) by mouth as needed for low blood sugar. <65   insulin aspart 100 UNIT/ML injection Commonly known as: novoLOG Inject into the skin continuous. (  insulin pump averages about 20u over 24 hours)   losartan 100 MG tablet Commonly known as: COZAAR Take 1 tablet (100 mg total) by mouth daily.   multivitamin with minerals Tabs tablet Take 1 tablet by mouth daily.   Netarsudil-Latanoprost 0.02-0.005 % Soln Place 1 drop into the right eye at bedtime.   Omnipod Classic Pods (Gen 3) Misc Inject 1 Piece into the skin every 3 (three) days.   oxyCODONE 5 MG immediate release tablet Commonly known as: Oxy IR/ROXICODONE Take 1 tablet (5 mg total) by mouth every 4 (four) hours as needed for up to 3 days for moderate pain. SNF use only   polyethylene glycol 17 g packet Commonly known as: MIRALAX / GLYCOLAX Take 17 g by mouth every other day.   promethazine 25 MG suppository Commonly known as: PHENERGAN Place 25 mg rectally every 6 (six) hours as needed for nausea or vomiting.   tamsulosin 0.4 MG Caps capsule Commonly known as: FLOMAX Take 1 capsule (0.4 mg total) by mouth daily.   venlafaxine 75 MG tablet Commonly known as: EFFEXOR Take 1 tablet (75 mg total) by mouth 2 (two) times daily.   vitamin C 500 MG tablet Commonly known as: ASCORBIC ACID Take 500 mg by mouth 2 (two) times daily.   Vitamin D-3 125 MCG (5000 UT) Tabs Take 5,000 Units by mouth daily.   Vyzulta 0.024 % Soln Generic drug: Latanoprostene Bunod Place 1 drop into the right eye daily.               Durable Medical Equipment  (From admission, onward)           Start     Ordered   05/17/21 1253  For home  use only DME Walker hemi  Once       Question:  Patient needs a walker to treat with the following condition  Answer:  Weakness   05/17/21 1253              Discharge Care Instructions  (From admission, onward)           Start     Ordered   05/18/21 0000  Discharge wound care:       Comments: Follow up with orthopedic surgeon as scheduled   05/18/21 1259            Follow-up Information     Hessie Knows, MD. Schedule an appointment as soon as possible for a visit on 05/20/2021.   Specialty: Orthopedic Surgery Contact information: [place] [address] [phone]                   If you experience worsening of your admission symptoms, develop shortness of breath, life threatening emergency, suicidal or homicidal thoughts you must seek medical attention immediately by calling 911 or calling your MD immediately  if symptoms less severe.   You must read complete instructions/literature along with all the possible adverse reactions/side effects for all the medicines you take and that have been prescribed to you. Take any new medicines after you have completely understood and accept all the possible adverse reactions/side effects.    Please note   You were cared for by a hospitalist during your hospital stay. If you have any questions about your discharge medications or the care you received while you were in the hospital after you are discharged, you can call the unit and asked to speak with the hospitalist on call  if the hospitalist that took care of you is not available. Once you are discharged, your primary care physician will handle any further medical issues. Please note that NO REFILLS for any discharge medications will be authorized once you are discharged, as it is imperative that you return to your primary care physician (or establish a relationship with a primary care physician if you do not have one) for  your aftercare needs so that they can reassess your need for medications and monitor your lab values.       Time coordinating discharge: 32 minutes  Signed:  Maiah Sinning  [facility] 05/18/2021, 1:00 PM   Pager on [email or web address]. If 7PM-7AM, please contact night-coverage at [email or web address]

## 2021-06-07 ENCOUNTER — Encounter (HOSPITAL_COMMUNITY): Payer: Self-pay | Admitting: Radiology

## 2021-06-23 ENCOUNTER — Other Ambulatory Visit: Payer: Self-pay | Admitting: Student in an Organized Health Care Education/Training Program

## 2021-06-23 ENCOUNTER — Other Ambulatory Visit: Payer: Self-pay | Admitting: Family Medicine

## 2021-06-23 DIAGNOSIS — R590 Localized enlarged lymph nodes: Secondary | ICD-10-CM

## 2021-06-24 ENCOUNTER — Other Ambulatory Visit: Payer: Self-pay | Admitting: Orthopedic Surgery

## 2021-06-27 ENCOUNTER — Encounter
Admission: RE | Admit: 2021-06-27 | Discharge: 2021-06-27 | Disposition: A | Discharge status: Home / Self Care | Payer: TRICARE For Life (TFL) | Source: Ambulatory Visit | Attending: Orthopedic Surgery | Admitting: Orthopedic Surgery

## 2021-06-27 ENCOUNTER — Other Ambulatory Visit: Payer: Self-pay

## 2021-06-27 HISTORY — DX: Blindness, one eye, unspecified eye: H54.40

## 2021-06-27 HISTORY — DX: Anemia, unspecified: D64.9

## 2021-06-27 NOTE — Patient Instructions (Addendum)
Your procedure is scheduled on:06-28-21 Tuesday Report to the Registration Desk on the 1st floor of the [facility].Then proceed to the 2nd floor surgery desk in the [facility] To find out your arrival time, please call [phone] between 1PM - 3PM on:06-27-21 Monday  REMEMBER: Instructions that are not followed completely may result in serious medical risk, up to and including death; or upon the discretion of your surgeon and anesthesiologist your surgery may need to be rescheduled.  Do not eat food after midnight the night before surgery.  No gum chewing, lozengers or hard candies.  You may however, drink Water up to 2 hours before you are scheduled to arrive for your surgery. Do not drink anything within 2 hours of your scheduled arrival time.  Type 1 and Type 2 diabetics should only drink water (You may take glucose 4 GM chewable tablet if needed if your blood sugar drops)  TAKE THESE MEDICATIONS THE MORNING OF SURGERY WITH A SIP OF WATER: -cetirizine (ZYRTEC) 10 MG tablet -gabapentin (NEURONTIN) 300 MG capsule -venlafaxine (EFFEXOR) 75 MG tablet  Last dose of clopidogrel (PLAVIX) 75 MG tablet was on 06-26-21 Sunday and was instructed by Dr Rudene Christians to not take Plavix on 06-27-21. Last dose of aspirin EC 81 MG tablet was today (06-27-21)-Do NOT take Plavix or Aspirin the day of surgery   Call Dr Joycie [facility] office today (06-27-21) regarding your Insulin Pump  One week prior to surgery: Stop Anti-inflammatories (NSAIDS) such as Advil, Aleve, Ibuprofen, Motrin, Naproxen, Naprosyn and Aspirin based products such as Excedrin, Goodys Powder, BC Powder.You may however, continue to take Tylenol if needed for pain up until the day of surgery.  Stop ANY OVER THE COUNTER supplements/vitamins NOW (06-27-21) until after surgery (Vitamin C)  No Alcohol for 24 hours before or after surgery.  No Smoking including e-cigarettes for 24 hours prior to surgery.  No chewable tobacco products for at least 6  hours prior to surgery.  No nicotine patches on the day of surgery.  Do not use any "recreational" drugs for at least a week prior to your surgery.  Please be advised that the combination of cocaine and anesthesia may have negative outcomes, up to and including death. If you test positive for cocaine, your surgery will be cancelled.  On the morning of surgery brush your teeth with toothpaste and water, you may rinse your mouth with mouthwash if you wish. Do not swallow any toothpaste or mouthwash  Do not wear jewelry, make-up, hairpins, clips or nail polish.  Do not wear lotions, powders, or perfumes.   Do not shave body from the neck down 48 hours prior to surgery just in case you cut yourself which could leave a site for infection.  Also, freshly shaved skin may become irritated if using the CHG soap.  Contact lenses, hearing aids and dentures may not be worn into surgery.  Do not bring valuables to the hospital. [facility] is not responsible for any missing/lost belongings or valuables.   Notify your doctor if there is any change in your medical condition (cold, fever, infection).  Wear comfortable clothing (specific to your surgery type) to the hospital.  After surgery, you can help prevent lung complications by doing breathing exercises.  Take deep breaths and cough every 1-2 hours. Your doctor may order a device called an Incentive Spirometer to help you take deep breaths. When coughing or sneezing, hold a pillow firmly against your incision with both hands. This is called "splinting." Doing this  helps protect your incision. It also decreases belly discomfort.  If you are being admitted to the hospital overnight, leave your suitcase in the car. After surgery it may be brought to your room.  If you are being discharged the day of surgery, you will not be allowed to drive home. You will need a responsible adult (18 years or older) to drive you home and stay with you that night.    If you are taking public transportation, you will need to have a responsible adult (18 years or older) with you. Please confirm with your physician that it is acceptable to use public transportation.   Please call the [facility] at [phone] if you have any questions about these instructions.  Surgery Visitation Policy:  Patients undergoing a surgery or procedure may have one family member or support person with them as long as that person is not COVID-19 positive or experiencing its symptoms.  That person may remain in the waiting area during the procedure and may rotate out with other people.  Inpatient Visitation:    Visiting hours are 7 a.m. to 8 p.m. Up to two visitors ages 16+ are allowed at one time in a patient room. The visitors may rotate out with other people during the day. Visitors must check out when they leave, or other visitors will not be allowed. One designated support person may remain overnight. The visitor must pass COVID-19 screenings, use hand sanitizer when entering and exiting the patient's room and wear a mask at all times, including in the patient's room. Patients must also wear a mask when staff or their visitor are in the room. Masking is required regardless of vaccination status.

## 2021-06-27 NOTE — Pre-Procedure Instructions (Signed)
Per pt, she was to have had her last dose of Plavix on 06-25-21 Saturday per Dr Rudene Christians. Pt states she forgot and took it on 11-6. Pt understands not to take Plavix today. I called Allyson at Dr Theodore Demark office and she informed Dr Rudene Christians that pt had taken her Plavix yesterday and her 60 mg Asa today. He said that was fine and would not interfere with her surgery.  Also, in the consent it was not noted that pt was to have a wound vac. This was found Dr Theodore Demark H&P. I informed Allyson of this and she asked Dr Rudene Christians and he said she will need a wound vac and that the OR will apply the wound vac. Pt does not have the supplies. Allyson to call the OR and add the wound vac placment and I added it to the pts consent

## 2021-06-28 ENCOUNTER — Ambulatory Visit: Payer: Medicare Other | Admitting: Certified Registered Nurse Anesthetist

## 2021-06-28 ENCOUNTER — Encounter
Admission: RE | Disposition: A | Discharge status: Home / Self Care | Payer: Self-pay | Source: Home / Self Care | Attending: Orthopedic Surgery

## 2021-06-28 ENCOUNTER — Other Ambulatory Visit: Payer: Self-pay

## 2021-06-28 ENCOUNTER — Encounter: Payer: Self-pay | Admitting: Orthopedic Surgery

## 2021-06-28 ENCOUNTER — Ambulatory Visit
Admission: RE | Admit: 2021-06-28 | Discharge: 2021-06-28 | Disposition: A | Discharge status: Home / Self Care | Payer: Medicare Other | Attending: Orthopedic Surgery | Admitting: Orthopedic Surgery

## 2021-06-28 DIAGNOSIS — Z794 Long term (current) use of insulin: Secondary | ICD-10-CM | POA: Insufficient documentation

## 2021-06-28 DIAGNOSIS — Y793 Surgical instruments, materials and orthopedic devices (including sutures) associated with adverse incidents: Secondary | ICD-10-CM | POA: Insufficient documentation

## 2021-06-28 DIAGNOSIS — T8484XA Pain due to internal orthopedic prosthetic devices, implants and grafts, initial encounter: Secondary | ICD-10-CM | POA: Insufficient documentation

## 2021-06-28 DIAGNOSIS — E038 Other specified hypothyroidism: Secondary | ICD-10-CM | POA: Insufficient documentation

## 2021-06-28 DIAGNOSIS — M9689 Other intraoperative and postprocedural complications and disorders of the musculoskeletal system: Secondary | ICD-10-CM | POA: Diagnosis not present

## 2021-06-28 DIAGNOSIS — Z9889 Other specified postprocedural states: Secondary | ICD-10-CM | POA: Diagnosis not present

## 2021-06-28 DIAGNOSIS — Y838 Other surgical procedures as the cause of abnormal reaction of the patient, or of later complication, without mention of misadventure at the time of the procedure: Secondary | ICD-10-CM | POA: Insufficient documentation

## 2021-06-28 HISTORY — PX: HARDWARE REMOVAL: SHX979

## 2021-06-28 LAB — GLUCOSE, CAPILLARY
Glucose-Capillary: 292 mg/dL — ABNORMAL HIGH (ref 70–99)
Glucose-Capillary: 254 mg/dL — ABNORMAL HIGH (ref 70–99)

## 2021-06-28 SURGERY — REMOVAL, HARDWARE
Anesthesia: General | Site: Elbow | Laterality: Left

## 2021-06-28 MED ORDER — NEOMYCIN-POLYMYXIN B GU 40-200000 IR SOLN
Status: DC | PRN
  Administered 2021-06-28: 4 mL

## 2021-06-28 MED ORDER — INSULIN ASPART 100 UNIT/ML IJ SOLN: 5.0000 [IU] | Freq: Once | INTRAMUSCULAR | Status: AC

## 2021-06-28 MED ORDER — ONDANSETRON HCL 4 MG/2ML IJ SOLN
INTRAMUSCULAR | Status: DC | PRN
  Administered 2021-06-28: 4 mg via INTRAVENOUS

## 2021-06-28 MED ORDER — CEFAZOLIN SODIUM-DEXTROSE 2-4 GM/100ML-% IV SOLN
2.0000 g | INTRAVENOUS | Status: AC
  Administered 2021-06-28: 2 g via INTRAVENOUS

## 2021-06-28 MED ORDER — ONDANSETRON HCL 4 MG PO TABS: 4.0000 mg | ORAL_TABLET | Freq: Four times a day (QID) | ORAL | Status: DC | PRN

## 2021-06-28 MED ORDER — PHENYLEPHRINE HCL (PRESSORS) 10 MG/ML IV SOLN
INTRAVENOUS | Status: DC | PRN
  Administered 2021-06-28 (×2): 100 ug via INTRAVENOUS
  Administered 2021-06-28: 150 ug via INTRAVENOUS

## 2021-06-28 MED ORDER — NEOMYCIN-POLYMYXIN B GU 40-200000 IR SOLN
Status: AC
  Filled 2021-06-28: qty 4

## 2021-06-28 MED ORDER — FENTANYL CITRATE (PF) 100 MCG/2ML IJ SOLN
25.0000 ug | INTRAMUSCULAR | Status: DC | PRN
  Administered 2021-06-28 (×3): 25 ug via INTRAVENOUS

## 2021-06-28 MED ORDER — FENTANYL CITRATE (PF) 100 MCG/2ML IJ SOLN
INTRAMUSCULAR | Status: DC | PRN
  Administered 2021-06-28: 25 ug via INTRAVENOUS
  Administered 2021-06-28: 50 ug via INTRAVENOUS
  Administered 2021-06-28: 25 ug via INTRAVENOUS

## 2021-06-28 MED ORDER — ONDANSETRON HCL 4 MG/2ML IJ SOLN: 4.0000 mg | Freq: Four times a day (QID) | INTRAMUSCULAR | Status: DC | PRN

## 2021-06-28 MED ORDER — OXYCODONE HCL 5 MG PO TABS
ORAL_TABLET | ORAL | Status: AC
  Administered 2021-06-28: 5 mg
  Filled 2021-06-28: qty 1

## 2021-06-28 MED ORDER — EPHEDRINE 5 MG/ML INJ
INTRAVENOUS | Status: AC
  Filled 2021-06-28: qty 5

## 2021-06-28 MED ORDER — ONDANSETRON HCL 4 MG/2ML IJ SOLN
INTRAMUSCULAR | Status: AC
  Filled 2021-06-28: qty 2

## 2021-06-28 MED ORDER — DEXAMETHASONE SODIUM PHOSPHATE 10 MG/ML IJ SOLN
INTRAMUSCULAR | Status: AC
  Filled 2021-06-28: qty 1

## 2021-06-28 MED ORDER — PROPOFOL 10 MG/ML IV BOLUS
INTRAVENOUS | Status: DC | PRN
  Administered 2021-06-28: 140 mg via INTRAVENOUS

## 2021-06-28 MED ORDER — FAMOTIDINE 20 MG PO TABS
ORAL_TABLET | ORAL | Status: AC
  Administered 2021-06-28: 20 mg via ORAL
  Filled 2021-06-28: qty 1

## 2021-06-28 MED ORDER — CHLORHEXIDINE GLUCONATE 0.12 % MT SOLN
OROMUCOSAL | Status: AC
  Administered 2021-06-28: 15 mL via OROMUCOSAL
  Filled 2021-06-28: qty 15

## 2021-06-28 MED ORDER — DEXAMETHASONE SODIUM PHOSPHATE 10 MG/ML IJ SOLN: INTRAMUSCULAR | Status: DC | PRN

## 2021-06-28 MED ORDER — EPHEDRINE SULFATE 50 MG/ML IJ SOLN
INTRAMUSCULAR | Status: DC | PRN
  Administered 2021-06-28: 5 mg via INTRAVENOUS
  Administered 2021-06-28: 10 mg via INTRAVENOUS
  Administered 2021-06-28: 5 mg via INTRAVENOUS

## 2021-06-28 MED ORDER — FAMOTIDINE 20 MG PO TABS: 20.0000 mg | ORAL_TABLET | Freq: Once | ORAL | Status: AC

## 2021-06-28 MED ORDER — CHLORHEXIDINE GLUCONATE 0.12 % MT SOLN: 15.0000 mL | Freq: Once | OROMUCOSAL | Status: AC

## 2021-06-28 MED ORDER — ACETAMINOPHEN 10 MG/ML IV SOLN
INTRAVENOUS | Status: DC | PRN
  Administered 2021-06-28: 1000 mg via INTRAVENOUS

## 2021-06-28 MED ORDER — FENTANYL CITRATE (PF) 100 MCG/2ML IJ SOLN
INTRAMUSCULAR | Status: AC
  Administered 2021-06-28: 25 ug via INTRAVENOUS
  Filled 2021-06-28: qty 2

## 2021-06-28 MED ORDER — FENTANYL CITRATE (PF) 100 MCG/2ML IJ SOLN
INTRAMUSCULAR | Status: AC
  Filled 2021-06-28: qty 2

## 2021-06-28 MED ORDER — LIDOCAINE HCL (CARDIAC) PF 100 MG/5ML IV SOSY
PREFILLED_SYRINGE | INTRAVENOUS | Status: DC | PRN
  Administered 2021-06-28: 40 mg via INTRAVENOUS

## 2021-06-28 MED ORDER — OXYCODONE HCL 5 MG PO TABS: 5.0000 mg | ORAL_TABLET | Freq: Three times a day (TID) | ORAL | 0 refills | Status: AC | PRN

## 2021-06-28 MED ORDER — SODIUM CHLORIDE 0.9 % IV SOLN: INTRAVENOUS | Status: DC

## 2021-06-28 MED ORDER — LIDOCAINE HCL (PF) 2 % IJ SOLN
INTRAMUSCULAR | Status: AC
  Filled 2021-06-28: qty 5

## 2021-06-28 MED ORDER — INSULIN ASPART 100 UNIT/ML IJ SOLN
INTRAMUSCULAR | Status: AC
  Administered 2021-06-28: 5 [IU] via SUBCUTANEOUS
  Filled 2021-06-28: qty 1

## 2021-06-28 MED ORDER — ORAL CARE MOUTH RINSE: 15.0000 mL | Freq: Once | OROMUCOSAL | Status: AC

## 2021-06-28 MED ORDER — ACETAMINOPHEN 10 MG/ML IV SOLN
INTRAVENOUS | Status: AC
  Filled 2021-06-28: qty 100

## 2021-06-28 MED ORDER — CEFAZOLIN SODIUM-DEXTROSE 2-4 GM/100ML-% IV SOLN
INTRAVENOUS | Status: AC
  Filled 2021-06-28: qty 100

## 2021-06-28 MED ORDER — 0.9 % SODIUM CHLORIDE (POUR BTL) OPTIME
TOPICAL | Status: DC | PRN
  Administered 2021-06-28: 1000 mL

## 2021-06-28 SURGICAL SUPPLY — 38 items
BNDG COHESIVE 4X5 TAN ST LF (GAUZE/BANDAGES/DRESSINGS) ×1 IMPLANT
BNDG ELASTIC 4X5.8 VLCR STR LF (GAUZE/BANDAGES/DRESSINGS) ×2 IMPLANT
CANISTER WOUND CARE 500ML ATS (WOUND CARE) IMPLANT
CHLORAPREP W/TINT 26 (MISCELLANEOUS) ×2 IMPLANT
CUFF TOURN SGL QUICK 18X4 (TOURNIQUET CUFF) ×1 IMPLANT
CUFF TOURN SGL QUICK 24 (TOURNIQUET CUFF)
CUFF TRNQT CYL 24X4X16.5-23 (TOURNIQUET CUFF) IMPLANT
ELECT CAUTERY BLADE 6.4 (BLADE) ×2 IMPLANT
ELECT REM PT RETURN 9FT ADLT (ELECTROSURGICAL) ×2
ELECTRODE REM PT RTRN 9FT ADLT (ELECTROSURGICAL) ×1 IMPLANT
GAUZE 4X4 16PLY ~~LOC~~+RFID DBL (SPONGE) ×2 IMPLANT
GAUZE SPONGE 4X4 12PLY STRL (GAUZE/BANDAGES/DRESSINGS) ×2 IMPLANT
GAUZE XEROFORM 1X8 LF (GAUZE/BANDAGES/DRESSINGS) ×1 IMPLANT
GLOVE SURG SYN 9.0  PF PI (GLOVE) ×1
GLOVE SURG SYN 9.0 PF PI (GLOVE) ×1 IMPLANT
GOWN SRG 2XL LVL 4 RGLN SLV (GOWNS) ×1 IMPLANT
GOWN STRL NON-REIN 2XL LVL4 (GOWNS) ×2
GOWN STRL REUS W/ TWL LRG LVL3 (GOWN DISPOSABLE) ×1 IMPLANT
GOWN STRL REUS W/TWL LRG LVL3 (GOWN DISPOSABLE) ×1
KIT PREVENA INCISION MGT 13 (CANNISTER) ×1 IMPLANT
KIT TURNOVER KIT A (KITS) ×2 IMPLANT
MANIFOLD NEPTUNE II (INSTRUMENTS) ×2 IMPLANT
NDL FILTER BLUNT 18X1 1/2 (NEEDLE) ×1 IMPLANT
NEEDLE FILTER BLUNT 18X 1/2SAF (NEEDLE) ×1
NEEDLE FILTER BLUNT 18X1 1/2 (NEEDLE) ×1 IMPLANT
NS IRRIG 1000ML POUR BTL (IV SOLUTION) ×1 IMPLANT
NS IRRIG 500ML POUR BTL (IV SOLUTION) ×1 IMPLANT
PACK EXTREMITY ARMC (MISCELLANEOUS) ×2 IMPLANT
PAD ABD DERMACEA PRESS 5X9 (GAUZE/BANDAGES/DRESSINGS) ×2 IMPLANT
PAD CAST CTTN 4X4 STRL (SOFTGOODS) ×2 IMPLANT
PADDING CAST COTTON 4X4 STRL (SOFTGOODS) ×2
SPONGE T-LAP 18X18 ~~LOC~~+RFID (SPONGE) ×2 IMPLANT
STOCKINETTE IMPERVIOUS 9X36 MD (GAUZE/BANDAGES/DRESSINGS) ×1 IMPLANT
STRIP CLOSURE SKIN 1/2X4 (GAUZE/BANDAGES/DRESSINGS) ×2 IMPLANT
SUT VIC AB 2-0 SH 27 (SUTURE) ×1
SUT VIC AB 2-0 SH 27XBRD (SUTURE) ×1 IMPLANT
SUT VIC AB 3-0 PS2 18 (SUTURE) ×2 IMPLANT
WATER STERILE IRR 500ML POUR (IV SOLUTION) ×2 IMPLANT

## 2021-06-28 NOTE — Anesthesia Procedure Notes (Signed)
Procedure Name: LMA Insertion Date/Time: 06/28/2021 2:18 PM Performed by: Demetrius Charity, CRNA Pre-anesthesia Checklist: Patient identified, Patient being monitored, Timeout performed, Emergency Drugs available and Suction available Patient Re-evaluated:Patient Re-evaluated prior to induction Oxygen Delivery Method: Circle system utilized Preoxygenation: Pre-oxygenation with 100% oxygen Induction Type: IV induction Ventilation: Mask ventilation without difficulty LMA: LMA inserted LMA Size: 3.0 Tube type: Oral Number of attempts: 1 Placement Confirmation: positive ETCO2 and breath sounds checked- equal and bilateral Tube secured with: Tape Dental Injury: Teeth and Oropharynx as per pre-operative assessment

## 2021-06-28 NOTE — Discharge Instructions (Addendum)
Activity as tolerated with left arm Keep arm clean and dry until recheck Incisional wound VAC is to suction at incision and help both wounds heal. Call office if having problems If the wound VAC is still working by Friday wait till next Tuesday for recheck so we will take the wound VAC off thenAMBULATORY SURGERY  DISCHARGE INSTRUCTIONS   The drugs that you were given will stay in your system until tomorrow so for the next 24 hours you should not:  Drive an automobile Make any legal decisions Drink any alcoholic beverage   You may resume regular meals tomorrow.  Today it is better to start with liquids and gradually work up to solid foods.  You may eat anything you prefer, but it is better to start with liquids, then soup and crackers, and gradually work up to solid foods.   Please notify your doctor immediately if you have any unusual bleeding, trouble breathing, redness and pain at the surgery site, drainage, fever, or pain not relieved by medication.    Your post-operative visit with Dr.                                       is: Date:                        Time:    Please call to schedule your post-operative visit.  Additional Instructions:

## 2021-06-28 NOTE — Progress Notes (Signed)
Reviewed prevana with patient's husband, verbalizes understanding, follow up appointment Friday. Medications reviewed with husband, verbalizes understanding.  Patient discharged to home.

## 2021-06-28 NOTE — Transfer of Care (Signed)
Immediate Anesthesia Transfer of Care Note  Patient: Erin Daniels  Procedure(s) Performed: HARDWARE REMOVAL (Left: Elbow)  Patient Location: PACU  Anesthesia Type:General  Level of Consciousness: awake  Airway & Oxygen Therapy: Patient Spontanous Breathing and Patient connected to face mask oxygen  Post-op Assessment: Report given to RN and Post -op Vital signs reviewed and stable  Post vital signs: Reviewed and stable  Last Vitals:  Vitals Value Taken Time  BP 135/64 06/28/21 1507  Temp 36.1 C 06/28/21 1508  Pulse 71 06/28/21 1515  Resp 19 06/28/21 1515  SpO2 100 % 06/28/21 1515  Vitals shown include unvalidated device data.  Last Pain:  Vitals:   06/28/21 1352  TempSrc: Temporal  PainSc: 3          Complications: No notable events documented.

## 2021-06-28 NOTE — Op Note (Signed)
06/28/2021  3:02 PM  PATIENT:  Erin Daniels  75 y.o. female  PRE-OPERATIVE DIAGNOSIS:  Status post open reduction and internal fixation of fracture Z98.890, Z87.81 Failed orthopedic implant, subsequent encounter T84.498D  POST-OPERATIVE DIAGNOSIS:  painful hardware  PROCEDURE:  Procedure(s): HARDWARE REMOVAL (Left)  SURGEON: Laurene Footman, MD  ASSISTANTS: None  ANESTHESIA:   general  EBL:  Total I/O In: -  Out: 2 [Blood:2]  BLOOD ADMINISTERED:none  DRAINS:  Incisional wound VAC    LOCAL MEDICATIONS USED:  NONE  SPECIMEN:  No Specimen  DISPOSITION OF SPECIMEN:  N/A  COUNTS:  YES  TOURNIQUET:   Total Tourniquet Time Documented: Upper Arm (Left) - 17 minutes Total: Upper Arm (Left) - 17 minutes   IMPLANTS: None  DICTATION: .Dragon Dictation patient was brought to the operating room and after adequate anesthesia was obtained the left arm was prepped and draped in the usual sterile fashion.  After patient identification and timeout procedures were completed tourniquet was raised.  The prior curvilinear incision was made over the posterior proximal ulna and olecranon.  Subcutaneous tissue was elevated and the plate exposed with all screws being removed.  The plate was removed without difficulty.  There was granulation tissue at the fracture site and there was no gross movement so was felt to be adequately healed were no additional fixation was required.  The wound was thoroughly irrigated and then the skin closed with 2-0 Vicryl subcutaneously and skin staples.  Additionally there is approximately 2 cm area where the skin was open over the plate this was reapproximated using staples and just a small area left open.  Incisional wound VAC was applied over both incisions and wound VAC started in the operating room.  This is a home unit to be brought home with patient and use for 1 week.  PLAN OF CARE: Discharge to home after PACU  PATIENT DISPOSITION:  PACU -  hemodynamically stable.

## 2021-06-28 NOTE — H&P (Signed)
Chief Complaint  Patient presents with   Left Elbow - Post Operative Visit    History of the Present Illness: Erin Daniels is a 75 y.o. female here today.   The patient presents for follow-up evaluation status post left olecranon ORIF and kyphoplasty preformed on 05/12/2021. She has developed an infection over her plate, and the plate is exposed. She comes in today for recheck. X-ray obtained.  The patient states her back is doing well. She presents with a walker.  I have reviewed past medical, surgical, social and family history, and allergies as documented in the EMR.  Past Medical History: Past Medical History:  Diagnosis Date   Angina pectoris (CMS-HCC)   Carpal tunnel syndrome on both sides 1999   Cervicalgia   Diabetic neuropathy (CMS-HCC)   Gastroparesis due to DM (CMS-HCC)   HTN (hypertension)   Hypercholesteremia   Hypercholesterolemia   Hypokalemia   Migraine headache   Osteoporosis, post-menopausal   Proliferative diabetic retinopathy(362.02) (CMS-HCC)   Rosacea   Stroke (CMS-HCC) 10/1999, 03/2000, 05/2000, 10/2017   Subclinical hypothyroidism   Type 1 diabetes mellitus (CMS-HCC)  Diagnosis 1969. Started insulin pump 1997.   Vertigo   Past Surgical History: Past Surgical History:  Procedure Laterality Date   APPENDECTOMY   ARTHROSCOPY SHOULDER   BITRECTOMY, MEMBRANE PEEL Left  EYE   CATARACT EXTRACTION   COLONOSCOPY 10/07/2009  FHx of Colon polyps-Mother/Repeat 62yr   COLONOSCOPY 10/08/2014  Poor Prep/FHx of colon polyps-mother/Normal Colon/Repeat 515yrPYO   COLONOSCOPY 03/17/2020  Diverticulosis/Otherwise normal/FHx CP/No Repeat due to age/TKT   ENDOSCOPIC CARPAL TUNNEL RELEASE Bilateral   ENDOSCOPIC CARPAL TUNNEL RELEASE Left 2000   EYE LID SURGERY   FRACTURE SURGERY Left  THUMB   HEMORRHOIDECTOMY EXTERNAL   HERNIA REPAIR   INCISION TENDON SHEATH FOR TRIGGER FINGER Bilateral   INTRAMEDULLARY NAIL INTERTROCHANTRIC HIP Right 11/2018  Dr.  KrMack Guise KYPHOPLASTY 05/12/2021  T11, by Dr. MeRudene Christians LASER EYE SURGERY   Open reduction and internal fixation of right index metacarpal shaft fracture Right 01/09/2019  Dr. PoRoland Rack ORIF Left Olecranon Left 05/12/2021  Dr. MeRudene Christians SYNOVECTOMY WRIST Left 2000  Extensor carpi ulnaris tendon with release of extensor compartmentalization.   TONSILLECTOMY   TUBAL LIGATION   Past Family History: Family History  Problem Relation Age of Onset   High blood pressure (Hypertension) Father   Heart failure Father   High blood pressure (Hypertension) Mother   Hypothyroidism Mother   Osteoporosis (Thinning of bones) Mother   Stroke Mother   Colon polyps Mother   Osteoporosis (Thinning of bones) Sister   Low back pain Sister   Diabetes Paternal Grandfather   Stroke Maternal Grandmother   Stroke Maternal Grandfather   Leukemia Daughter   Medications: Current Outpatient Medications Ordered in Epic  Medication Sig Dispense Refill   ascorbic acid (VITAMIN C) 500 MG tablet Take 1,000 mg by mouth once daily   aspirin 81 MG chewable tablet Take 81 mg by mouth once daily   atorvastatin (LIPITOR) 40 MG tablet TAKE 1 TABLET DAILY 90 tablet 3   BIOTIN ORAL Take 5,000 Units by mouth once daily   blood glucose diagnostic test strip Checks glucose 8 times a day. ONE TOUCH VERIO TEST STRIPS 750 each 3   brimonidine-timoloL (COMBIGAN) 0.2-0.5 % ophthalmic solution 1 drop 2 (two) times daily   butalbital-acetaminophen-caffeine (FIORICET) 50-325-40 mg tablet   calcium carbonate-vitamin D3 (CALTRATE 600+D) 600 mg-5 mcg (200 unit) tablet Take 1 tablet by mouth  once daily   carboxymethylcellulose (REFRESH TEARS) 0.5 % ophthalmic solution 1 drop in both eyes as needed as needed   cephalexin (KEFLEX) 500 MG capsule Take 1 capsule (500 mg total) by mouth 4 (four) times daily for 10 days 40 capsule 0   cetirizine (ZYRTEC) 10 MG tablet Take 10 mg by mouth once daily   cholecalciferol, vitamin D3, (VITAMIN D3 ORAL)  Take 2,000 Units by mouth once daily   clopidogreL (PLAVIX) 75 mg tablet TAKE 1 TABLET DAILY 90 tablet 3   dorzolamide-timoloL (COSOPT) 22.3-6.8 mg/mL ophthalmic solution   ferrous sulfate 325 (65 FE) MG tablet Take 325 mg by mouth daily with breakfast.   gabapentin (NEURONTIN) 300 MG capsule TAKE 1 CAPSULE IN THE MORNING, 1 CAPSULE AT LUNCH AND 3 CAPSULES AT BEDTIME 450 capsule 3   glucagon (GLUCAGON) 1 mg/mL injection 1 mg IM in case of severe hypoglycemia 1 each 1   glucose chew tablet 4 gram chewable tablet Take 4 tablets by mouth as needed   insulin ASPART (NOVOLOG U-100 INSULIN ASPART) injection (concentration 100 units/mL) USE UP TO 50 UNITS DAILY AS DIRECTED VIA INSULIN PUMP 60 mL 3   insulin pump cart,auto,BT-cntr (OMNIPOD 5 G6 INTRO KIT, GEN 5,) Crtg Inject 1 each subcutaneously every third day 1 each 0   insulin pump cart,automated,BT (OMNIPOD 5 G6 PODS, GEN 5,) Crtg Inject 1 each subcutaneously every third day 30 each 3   losartan (COZAAR) 50 MG tablet Take 1 tablet (50 mg total) by mouth once daily 90 tablet 1   multivitamin (THERAGRAN) tablet 1 tab by mouth daily   ondansetron (ZOFRAN) 4 MG tablet   peg 400-propylene glycol, PF, (SYSTANE ULTRA) 0.4-0.3 % ophthalmic drops Place 1 drop into both eyes as needed for Dry Eyes   polyethylene glycol (MIRALAX) powder 1 packet by mouth daily   promethazine (PHENERGAN) 25 MG suppository Place 1 suppository rectally as needed   propylene glycoL (SYSTANE BALANCE) 0.6 % ophthalmic drops Apply to eye   ROCKLATAN 0.02-0.005 % ophthalmic solution   sennosides (SENOKOT) 8.6 mg tablet Take 1 tablet by mouth Every 3 days   sulfamethoxazole-trimethoprim (BACTRIM DS) 800-160 mg tablet   tamsulosin (FLOMAX) 0.4 mg capsule Take 1 capsule by mouth once daily   traMADoL (ULTRAM) 50 mg tablet   vitamin E 400 UNIT capsule Take 400 Units by mouth once daily   VYZULTA 0.024 % eye drops   denosumab (PROLIA) 60 mg/mL inj syringe Inject 1 mL (60 mg total)  subcutaneously once for 1 dose Repeat in 6 months. 1 mL 1   venlafaxine (EFFEXOR) 75 MG tablet Take 1 tablet (75 mg total) by mouth 2 (two) times daily (Patient taking differently: Take 150 mg by mouth 2 (two) times daily) 180 tablet 3   No current Epic-ordered facility-administered medications on file.   Allergies: Allergies  Allergen Reactions   Hydrocodone Other (See Comments)  Diabetic/ High sugars   Mepivacaine Hcl Palpitations  Low bp   Baclofen Other (See Comments)  Mental status changes   Butorphanol Nausea and Other (See Comments)  Reaction: stress attacks/ headache   Oxycodone Nausea and Other (See Comments)  Reaction: stress attacks/ headache   Tussionex Other (See Comments)  Other Reaction: Not Assessed   Codeine Nausea  Reaction: stress attacks/ headache and nausea Can take Vicodin and Percocet   Meperidine Nausea  Reaction: stress attacks/ headache and nausea   Metoclopramide Hcl Nausea  Reaction: stress attacks/ headache    Body mass index is 19.94  kg/m.  Review of Systems: A comprehensive 14 point ROS was performed, reviewed, and the pertinent orthopaedic findings are documented in the HPI.  Vitals:  06/24/21 1017  BP: 118/66    General Physical Examination:   General/Constitutional: No apparent distress: well-nourished and well developed. Eyes: Pupils equal, round with synchronous movement. Lungs: Clear to auscultation HEENT: Normal Vascular: No edema, swelling or tenderness, except as noted in detailed exam. Cardiac: Heart rate and rhythm is regular. Integumentary: No impressive skin lesions present, except as noted in detailed exam. Neuro/Psych: Normal mood and affect, oriented to person, place and time.  On exam, the patient has a 1.5 cm open area over the left olecranon with exposed hardware. Her prior incision has healed.   Radiographs:  AP, lateral, and oblique x-rays of the left elbow were ordered and personally reviewed today. These show  the olecranon has shifted slightly about 1 cm. The off plate is in the same position. It does not appear to have screw penetration of the joint other than where the fracture has moved.  X-ray Impression Loss of fixation of olecranon 6 weeks postoperative.  Assessment: ICD-10-CM  1. Status post open reduction and internal fixation (ORIF) of fracture Z98.890  Z87.81  2. Failed orthopedic implant, subsequent encounter T84.498D   Plan:  The patient has clinical findings of exposed hardware, left elbow.  We will plan for removal of deep hardware and application of a wound VAC next week.  Surgical Risks:  The nature of the condition and the proposed procedure has been reviewed in detail with the patient. Surgical versus non-surgical options and prognosis for recovery have been reviewed and the inherent risks and benefits of each have been discussed including the risks of infection, bleeding, injury to nerves/blood vessels/tendons, incomplete relief of symptoms, persisting pain and/or stiffness, loss of function, complex regional pain syndrome, failure of the procedure, as appropriate.  Scribe Attestation: I, Dawn Royse, am acting as scribe for [employer], MD.   Electronically signed by Lauris Poag, MD at 06/26/2021 9:49 PM EST  Reviewed  H+P. No changes noted.

## 2021-06-29 ENCOUNTER — Encounter: Payer: Self-pay | Admitting: Orthopedic Surgery

## 2021-07-05 NOTE — Anesthesia Preprocedure Evaluation (Signed)
Anesthesia Evaluation  Patient identified by MRN, date of birth, ID band Patient awake    Reviewed: Allergy & Precautions, H&P , NPO status , Patient's Chart, lab work & pertinent test results, reviewed documented beta blocker date and time   History of Anesthesia Complications (+) history of anesthetic complications  Airway Mallampati: II   Neck ROM: full    Dental  (+) Poor Dentition   Pulmonary neg pulmonary ROS,    Pulmonary exam normal        Cardiovascular hypertension, + angina with exertion negative cardio ROS Normal cardiovascular exam Rhythm:regular Rate:Normal     Neuro/Psych  Headaches, Seizures -,  PSYCHIATRIC DISORDERS Anxiety Depression  Neuromuscular disease CVA negative neurological ROS  negative psych ROS   GI/Hepatic negative GI ROS, Neg liver ROS,   Endo/Other  negative endocrine ROSdiabetesHypothyroidism   Renal/GU negative Renal ROS  negative genitourinary   Musculoskeletal   Abdominal   Peds  Hematology negative hematology ROS (+) Blood dyscrasia, anemia ,   Anesthesia Other Findings Past Medical History: No date: Anemia No date: Angina pectoris (HCC) No date: At high risk for falls 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 No date: Cancer ([place]) No date: Carpal tunnel syndrome     Comment:  BILATERAL No date: Cervicalgia No date: Complication of anesthesia     Comment:  difficult to arouse No date: Diabetes mellitus without complication (HCC)     Comment:  Type I No date: Diabetic neuropathy (HCC) No date: Diabetic neuropathy (HCC) No date: Diabetic retinopathy ([place]) No date: Diabetic retinopathy ([place]) No date: Dizziness No date: Gastroparesis No date: Headache     Comment:  MIGRAINE No date: Herniated disc, cervical No date: Hypercholesteremia No date: Hypertension No date: Hypokalemia No date: Hypotensive episode No date: Hypothyroidism No date: Insulin pump in place No date: Myofascial  muscle pain No date: Numbness of arm     Comment:  left No date: Osteoporosis No date: Poor vision No date: Rosacea No date: Seizures ([place]) No date: Slurred speech No date: Stroke (cerebrum) ([place])     Comment:  4/19 No date: Stroke [facility])     Comment:  x5 No date: Vertigo No date: Wears glasses Past Surgical History: No date: APPENDECTOMY 2011: AUGMENTATION MAMMAPLASTY; Bilateral No date: BILATERAL CARPAL TUNNEL RELEASE No date: BITRECTOMY     Comment:  MEMBRAIN PEEL EYE No date: COLONOSCOPY 03/17/2020: COLONOSCOPY WITH PROPOFOL; N/A     Comment:  Procedure: COLONOSCOPY WITH PROPOFOL;  Surgeon: [place],               Benay Pike, MD;  Location: [facility] ENDOSCOPY;  Service:               Gastroenterology;  Laterality: N/A; No date: EYE SURGERY; Bilateral     Comment:  cataract No date: FRACTURE SURGERY 06/28/2021: HARDWARE REMOVAL; Left     Comment:  Procedure: HARDWARE REMOVAL;  Surgeon: Hessie Knows,               MD;  Location: [facility] ORS;  Service: Orthopedics;                Laterality: Left; No date: HEMORROIDECTOMY No date: HERNIA REPAIR     Comment:  umbilical 0/05/9322: INTRAMEDULLARY (IM) NAIL INTERTROCHANTERIC; Right     Comment:  Procedure: INTRAMEDULLARY (IM) NAIL INTERTROCHANTRIC;                Surgeon: Thornton Park, MD;  Location: [facility] ORS;  Service: Orthopedics;  Laterality: Right; No date: JOINT REPLACEMENT     Comment:  right hip 05/12/2021: KYPHOPLASTY; N/A     Comment:  Procedure: KYPHOPLASTY - T11;  Surgeon: Hessie Knows,               MD;  Location: [facility] ORS;  Service: Orthopedics;                Laterality: N/A; 05/29/2015: OPEN REDUCTION INTERNAL FIXATION (ORIF) DISTAL RADIAL  FRACTURE; Right     Comment:  Procedure: OPEN REDUCTION INTERNAL FIXATION (ORIF) RIGHT              DISTAL RADIUS FRACTURE WITH ALLOGRAFT BONE GRAFTING FOR               REPAIR AND RECONSTRUCTION;  Surgeon: Roseanne Kaufman, MD;               Location: [place];   Service: Orthopedics;  Laterality:               Right; 01/09/2019: OPEN REDUCTION INTERNAL FIXATION (ORIF) METACARPAL; Right     Comment:  Procedure: OPEN REDUCTION INTERNAL FIXATION (ORIF) RIGHT              SECOND METACARPAL FRACTURE;  Surgeon: Corky Mull, MD;               Location: [facility] ORS;  Service: Orthopedics;  Laterality:               Right; 05/12/2021: ORIF ELBOW FRACTURE; Left     Comment:  Procedure: OPEN REDUCTION INTERNAL FIXATION (ORIF)               ELBOW/OLECRANON FRACTURE;  Surgeon: Hessie Knows, MD;                Location: [facility] ORS;  Service: Orthopedics;  Laterality:               Left; No date: SHOULDER ARTHROSCOPY; Left No date: SHOULDER HEMI-ARTHROPLASTY; Left No date: SYNOVECTOMY No date: TONSILLECTOMY No date: TRIGGER FINGER RELEASE No date: TUBAL LIGATION BMI    Body Mass Index: 20.16 kg/m     Reproductive/Obstetrics negative OB ROS                             Anesthesia Physical Anesthesia Plan  ASA: 3  Anesthesia Plan: General   Post-op Pain Management:    Induction:   PONV Risk Score and Plan:   Airway Management Planned:   Additional Equipment:   Intra-op Plan:   Post-operative Plan:   Informed Consent: I have reviewed the patients History and Physical, chart, labs and discussed the procedure including the risks, benefits and alternatives for the proposed anesthesia with the patient or authorized representative who has indicated his/her understanding and acceptance.     Dental Advisory Given  Plan Discussed with: CRNA  Anesthesia Plan Comments:         Anesthesia Quick Evaluation

## 2021-07-05 NOTE — Anesthesia Postprocedure Evaluation (Signed)
Anesthesia Post Note  Patient: Erin Daniels  Procedure(s) Performed: HARDWARE REMOVAL (Left: Elbow)  Patient location during evaluation: PACU Anesthesia Type: General Level of consciousness: awake and alert Pain management: pain level controlled Vital Signs Assessment: post-procedure vital signs reviewed and stable Respiratory status: spontaneous breathing, nonlabored ventilation, respiratory function stable and patient connected to nasal cannula oxygen Cardiovascular status: blood pressure returned to baseline and stable Postop Assessment: no apparent nausea or vomiting Anesthetic complications: no   No notable events documented.   Last Vitals:  Vitals:   06/28/21 1545 06/28/21 1600  BP: 124/60 137/64  Pulse: 70 64  Resp: (!) 27 16  Temp:  36.4 C  SpO2: 100% 98%    Last Pain:  Vitals:   06/28/21 1600  TempSrc: Temporal  PainSc: [name]

## 2021-07-08 ENCOUNTER — Other Ambulatory Visit: Payer: Self-pay | Admitting: Family Medicine

## 2021-07-08 DIAGNOSIS — R590 Localized enlarged lymph nodes: Secondary | ICD-10-CM

## 2021-07-22 ENCOUNTER — Inpatient Hospital Stay: Admission: RE | Admit: 2021-07-22 | Payer: TRICARE For Life (TFL) | Source: Ambulatory Visit

## 2021-08-12 ENCOUNTER — Ambulatory Visit
Admission: RE | Admit: 2021-08-12 | Discharge: 2021-08-12 | Disposition: A | Discharge status: Home / Self Care | Payer: Medicare Other | Source: Ambulatory Visit | Attending: Family Medicine | Admitting: Family Medicine

## 2021-08-12 ENCOUNTER — Other Ambulatory Visit: Payer: Self-pay

## 2021-08-12 DIAGNOSIS — R590 Localized enlarged lymph nodes: Secondary | ICD-10-CM | POA: Diagnosis present

## 2021-10-31 ENCOUNTER — Other Ambulatory Visit: Payer: Self-pay | Admitting: Family Medicine

## 2021-10-31 DIAGNOSIS — Z1231 Encounter for screening mammogram for malignant neoplasm of breast: Secondary | ICD-10-CM

## 2021-12-20 ENCOUNTER — Ambulatory Visit
Admission: RE | Admit: 2021-12-20 | Discharge: 2021-12-20 | Disposition: A | Discharge status: Home / Self Care | Payer: Medicare Other | Source: Ambulatory Visit | Attending: Family Medicine | Admitting: Family Medicine

## 2021-12-20 DIAGNOSIS — Z1231 Encounter for screening mammogram for malignant neoplasm of breast: Secondary | ICD-10-CM | POA: Diagnosis not present

## 2023-02-05 ENCOUNTER — Other Ambulatory Visit: Payer: Self-pay | Admitting: Family Medicine

## 2023-02-05 DIAGNOSIS — Z1231 Encounter for screening mammogram for malignant neoplasm of breast: Secondary | ICD-10-CM

## 2023-02-16 ENCOUNTER — Other Ambulatory Visit: Payer: Self-pay | Admitting: Family Medicine

## 2023-02-16 ENCOUNTER — Ambulatory Visit
Admission: RE | Admit: 2023-02-16 | Discharge: 2023-02-16 | Disposition: A | Discharge status: Home / Self Care | Payer: Medicare Other | Source: Ambulatory Visit | Attending: Family Medicine | Admitting: Family Medicine

## 2023-02-16 DIAGNOSIS — Z1231 Encounter for screening mammogram for malignant neoplasm of breast: Secondary | ICD-10-CM

## 2023-10-01 ENCOUNTER — Ambulatory Visit
Admission: RE | Admit: 2023-10-01 | Discharge: 2023-10-01 | Disposition: A | Discharge status: Home / Self Care | Payer: Medicare Other | Source: Ambulatory Visit | Attending: Internal Medicine | Admitting: Internal Medicine

## 2023-10-01 ENCOUNTER — Other Ambulatory Visit: Payer: Self-pay | Admitting: Internal Medicine

## 2023-10-01 DIAGNOSIS — M79661 Pain in right lower leg: Secondary | ICD-10-CM | POA: Insufficient documentation

## 2023-10-01 DIAGNOSIS — M7989 Other specified soft tissue disorders: Secondary | ICD-10-CM | POA: Insufficient documentation

## 2023-10-30 ENCOUNTER — Ambulatory Visit: Admit: 2023-10-30 | Payer: Medicare Other | Admitting: Internal Medicine

## 2023-10-30 SURGERY — COLONOSCOPY WITH PROPOFOL
Anesthesia: General

## 2023-11-23 ENCOUNTER — Other Ambulatory Visit: Payer: Self-pay

## 2023-11-23 ENCOUNTER — Emergency Department

## 2023-11-23 ENCOUNTER — Emergency Department
Admission: EM | Admit: 2023-11-23 | Discharge: 2023-11-23 | Disposition: A | Discharge status: Home / Self Care | Attending: Emergency Medicine | Admitting: Emergency Medicine

## 2023-11-23 DIAGNOSIS — R4781 Slurred speech: Secondary | ICD-10-CM | POA: Insufficient documentation

## 2023-11-23 DIAGNOSIS — E10649 Type 1 diabetes mellitus with hypoglycemia without coma: Secondary | ICD-10-CM | POA: Insufficient documentation

## 2023-11-23 DIAGNOSIS — R531 Weakness: Secondary | ICD-10-CM | POA: Diagnosis present

## 2023-11-23 DIAGNOSIS — I10 Essential (primary) hypertension: Secondary | ICD-10-CM | POA: Diagnosis not present

## 2023-11-23 DIAGNOSIS — D649 Anemia, unspecified: Secondary | ICD-10-CM | POA: Diagnosis not present

## 2023-11-23 DIAGNOSIS — Z978 Presence of other specified devices: Secondary | ICD-10-CM | POA: Insufficient documentation

## 2023-11-23 DIAGNOSIS — I6381 Other cerebral infarction due to occlusion or stenosis of small artery: Secondary | ICD-10-CM | POA: Diagnosis not present

## 2023-11-23 DIAGNOSIS — Z794 Long term (current) use of insulin: Secondary | ICD-10-CM | POA: Insufficient documentation

## 2023-11-23 DIAGNOSIS — E162 Hypoglycemia, unspecified: Secondary | ICD-10-CM

## 2023-11-23 LAB — COMPREHENSIVE METABOLIC PANEL WITH GFR
ALT: 19 U/L (ref 0–44)
AST: 23 U/L (ref 15–41)
Albumin: 3.9 g/dL (ref 3.5–5.0)
Alkaline Phosphatase: 55 U/L (ref 38–126)
Anion gap: 10 (ref 5–15)
BUN: 40 mg/dL — ABNORMAL HIGH (ref 8–23)
CO2: 25 mmol/L (ref 22–32)
Calcium: 9.9 mg/dL (ref 8.9–10.3)
Chloride: 107 mmol/L (ref 98–111)
Creatinine, Ser: 0.93 mg/dL (ref 0.44–1.00)
GFR, Estimated: >60 mL/min (ref >60)
Glucose, Bld: 43 mg/dL — CL (ref 70–99)
Potassium: 3.5 mmol/L (ref 3.5–5.1)
Sodium: 142 mmol/L (ref 135–145)
Total Bilirubin: 0.6 mg/dL (ref 0.0–1.2)
Total Protein: 7.2 g/dL (ref 6.5–8.1)

## 2023-11-23 LAB — CBC
HCT: 31.3 % — ABNORMAL LOW (ref 36.0–46.0)
Hemoglobin: 10.4 g/dL — ABNORMAL LOW (ref 12.0–15.0)
MCH: 30.2 pg (ref 26.0–34.0)
MCHC: 33.2 g/dL (ref 30.0–36.0)
MCV: 91 fL (ref 80.0–100.0)
Platelets: 264 10*3/uL (ref 150–400)
RBC: 3.44 MIL/uL — ABNORMAL LOW (ref 3.87–5.11)
RDW: 12.1 % (ref 11.5–15.5)
WBC: 8.6 10*3/uL (ref 4.0–10.5)
nRBC: 0 % (ref 0.0–0.2)

## 2023-11-23 LAB — DIFFERENTIAL
Abs Immature Granulocytes: 0.04 10*3/uL (ref 0.00–0.07)
Basophils Absolute: 0 10*3/uL (ref 0.0–0.1)
Basophils Relative: 1 %
Eosinophils Absolute: 0.4 10*3/uL (ref 0.0–0.5)
Eosinophils Relative: 4 %
Immature Granulocytes: 1 %
Lymphocytes Relative: 20 %
Lymphs Abs: 1.7 10*3/uL (ref 0.7–4.0)
Monocytes Absolute: 0.9 10*3/uL (ref 0.1–1.0)
Monocytes Relative: 11 %
Neutro Abs: 5.5 10*3/uL (ref 1.7–7.7)
Neutrophils Relative %: 63 %

## 2023-11-23 LAB — PROTIME-INR
INR: 1 (ref 0.8–1.2)
Prothrombin Time: 13.6 s (ref 11.4–15.2)

## 2023-11-23 LAB — APTT: aPTT: 31 s (ref 24–36)

## 2023-11-23 LAB — CBG MONITORING, ED
Glucose-Capillary: 100 mg/dL — ABNORMAL HIGH (ref 70–99)
Glucose-Capillary: 208 mg/dL — ABNORMAL HIGH (ref 70–99)

## 2023-11-23 LAB — ETHANOL: Alcohol, Ethyl (B): <10 mg/dL (ref <10)

## 2023-11-23 MED ORDER — SODIUM CHLORIDE 0.9% FLUSH: 3.0000 mL | Freq: Once | INTRAVENOUS | Status: DC

## 2023-11-23 NOTE — Discharge Instructions (Addendum)
 You were seen in the ER today for your confusion.  I suspect this was related to your low sugar levels.  Please replace your continuous glucose monitor and insulin pump when you are home.  Confirm that your glucose monitor is working with a fingerstick for your first couple checks.  Follow-up with your primary care doctor and endocrinologist for further evaluation.  Return to the ER for new or worsening symptoms.

## 2023-11-23 NOTE — ED Provider Notes (Signed)
 [facility] Provider Note    Event Date/Time   First MD Initiated Contact with Patient 11/23/23 1738     (approximate)   History   Weakness   HPI  Erin Daniels is a 78 year old female with history of type 1 diabetes, HTN presenting to the emergency department for evaluation of weakness.  Last known well last night at 2300.  The patient woke up this morning feeling weak.  Later began to develop slurred speech. Husband notes this is similar to when patient has had low blood sugar in the past, but they checked her continuous glucose monitor which reported that her blood sugar was 130.  In the setting of this, has not was concerned about alternative pathology and called EMS.  However, since arriving in the ER, they have learned that patient did have low blood sugar at 43, and suspect that her continuous glucose monitor was not properly functioning causing her insulin pump to deliver excess insulin resulting in her hypoglycemia.  Prior to my evaluation, patient had received graham crackers and a sandwich and felt back at her baseline.  Husband also feels that patient is at her baseline.  No recent fevers, chills, cough, congestion, chest pain, shortness of breath, nausea, vomiting, diarrhea, dysuria, abdominal pain.    Physical Exam   Triage Vital Signs: ED Triage Vitals  Encounter Vitals Group     BP 11/23/23 1320 (!) 129/55     Systolic BP Percentile --      Diastolic BP Percentile --      Pulse Rate 11/23/23 1320 70     Resp 11/23/23 1320 17     Temp 11/23/23 1320 97.9 F (36.6 C)     Temp Source 11/23/23 1320 Oral     SpO2 11/23/23 1320 95 %     Weight 11/23/23 1317 110 lb 3.7 oz (50 kg)     Height --      Head Circumference --      Peak Flow --      Pain Score 11/23/23 1317 0     Pain Loc --      Pain Education --      Exclude from Growth Chart --     Most recent vital signs: Vitals:   11/23/23 1320 11/23/23 1750  BP: (!) 129/55 (!) 119/99   Pulse: 70 72  Resp: 17 13  Temp: 97.9 F (36.6 C) 97.9 F (36.6 C)  SpO2: 95% 97%     General: Awake, interactive  CV:  Regular rate, good peripheral perfusion.  Resp:  Unlabored respirations, lungs clear to auscultation Abd:  Nondistended, soft, nontender to palpation, Omnipod and continuous glucose monitor in place over abdomen.  Husband is able to show me where both of these have been turned off currently. Neuro:  Keenly aware, correctly answers month and age, able to blink eyes and squeeze hands, normal horizontal extraocular movements, baseline significant visual impairment, but no superimposed visual field loss, normal facial symmetry, no arm or leg drift, 5 out of 5 strength, somewhat difficult finger-nose testing due to impaired vision, though not overtly ataxic, intact sensation, no aphasia, dysarthria, inattention   ED Results / Procedures / Treatments   Labs (all labs ordered are listed, but only abnormal results are displayed) Labs Reviewed  CBC - Abnormal; Notable for the following components:      Result Value   RBC 3.44 (*)    Hemoglobin 10.4 (*)    HCT 31.3 (*)  All other components within normal limits  COMPREHENSIVE METABOLIC PANEL WITH GFR - Abnormal; Notable for the following components:   Glucose, Bld 43 (*)    BUN 40 (*)    All other components within normal limits  CBG MONITORING, ED - Abnormal; Notable for the following components:   Glucose-Capillary 100 (*)    All other components within normal limits  CBG MONITORING, ED - Abnormal; Notable for the following components:   Glucose-Capillary 208 (*)    All other components within normal limits  PROTIME-INR  APTT  DIFFERENTIAL  ETHANOL     EKG EKG independently reviewed interpreted by myself (ER attending) demonstrates:    RADIOLOGY Imaging independently reviewed and interpreted by myself demonstrates:  CT head without acute bleed  PROCEDURES:  Critical Care performed:  No  Procedures   MEDICATIONS ORDERED IN ED: Medications  sodium chloride flush (NS) 0.9 % injection 3 mL (0 mLs Intravenous Hold 11/23/23 1741)     IMPRESSION / MDM / ASSESSMENT AND PLAN / ED COURSE  I reviewed the triage vital signs and the nursing notes.  Differential diagnosis includes, but is not limited to, hypoglycemia, intracranial bleed, consideration for TIA, CVA, electrolyte abnormality  Patient's presentation is most consistent with acute presentation with potential threat to life or bodily function.  78 year old female presenting with weakness and slurred speech.  Significant hypoglycemia on initial labs with a glucose of 43, but did p.o. prior to my evaluation with improvement to 100, then 208 on repeat several hours later.  Stable anemia.  At the time of my initial evaluation, patient feels back to her baseline.  Her exam is reassuring.  Her clinical history is consistent with dysfunction of her continuous glucose monitor with resultant excess insulin delivery.  Has been has turned off both of these devices and reports that he has appropriate supplies available at their house to replace these as well as the ability to perform fingerstick glucose checks to confirm the accuracy of her continuous glucose monitor before continuing to use this.  With this, do think patient is stable for discharge home.  Strict return precautions provided.  Patient discharged stable condition.      FINAL CLINICAL IMPRESSION(S) / ED DIAGNOSES   Final diagnoses:  Hypoglycemia  Uses self-applied continuous glucose monitoring device     Rx / DC Orders   ED Discharge Orders     None        Note:  This document was prepared using Dragon voice recognition software and may include unintentional dictation errors.   Trinna Post, MD 11/23/23 [phone]

## 2023-11-23 NOTE — ED Notes (Signed)
 Patient given 8 oz orange juice and lunch tray. AAOx3.  Skin warm and dry. NAD. Spouse at bedside.

## 2023-11-23 NOTE — ED Notes (Signed)
AAOx3.  Skin warm and dry.  NAD 

## 2023-11-23 NOTE — ED Triage Notes (Signed)
 Pt comes via EMS from home with increased weakness. Pt husband called 911 for possible stroke. Stroke screen neg per EMS. Pt does have deficit of slurred speech from past one. Pt is more lethargic and weak per husband. Pt was ok last night. Pt is blind in right eye and left eye getting worse.   HR 70 BP-129/82 CBG 180 95 RA 20 right arm

## 2023-11-23 NOTE — ED Triage Notes (Signed)
 AAOx3.  Skin warm and dry. MAE equally, right sided facial droop appreciated.  Patient states she went to bed last night at 2300 and woke up this morning feeling weak.

## 2023-12-18 NOTE — Progress Notes (Signed)
 Presented with altered mental status, alcohol /drug screening ordered to evaluate for possible etiologies.

## 2024-03-12 ENCOUNTER — Other Ambulatory Visit: Payer: Self-pay

## 2024-03-12 DIAGNOSIS — Z7902 Long term (current) use of antithrombotics/antiplatelets: Secondary | ICD-10-CM | POA: Insufficient documentation

## 2024-03-12 DIAGNOSIS — D500 Iron deficiency anemia secondary to blood loss (chronic): Secondary | ICD-10-CM | POA: Diagnosis not present

## 2024-03-12 DIAGNOSIS — I10 Essential (primary) hypertension: Secondary | ICD-10-CM | POA: Insufficient documentation

## 2024-03-12 DIAGNOSIS — E039 Hypothyroidism, unspecified: Secondary | ICD-10-CM | POA: Insufficient documentation

## 2024-03-12 DIAGNOSIS — Z794 Long term (current) use of insulin: Secondary | ICD-10-CM | POA: Diagnosis not present

## 2024-03-12 DIAGNOSIS — Z96641 Presence of right artificial hip joint: Secondary | ICD-10-CM | POA: Insufficient documentation

## 2024-03-12 DIAGNOSIS — Z79899 Other long term (current) drug therapy: Secondary | ICD-10-CM | POA: Insufficient documentation

## 2024-03-12 DIAGNOSIS — Z859 Personal history of malignant neoplasm, unspecified: Secondary | ICD-10-CM | POA: Insufficient documentation

## 2024-03-12 DIAGNOSIS — Z7982 Long term (current) use of aspirin: Secondary | ICD-10-CM | POA: Insufficient documentation

## 2024-03-12 DIAGNOSIS — E119 Type 2 diabetes mellitus without complications: Secondary | ICD-10-CM | POA: Diagnosis not present

## 2024-03-12 DIAGNOSIS — K068 Other specified disorders of gingiva and edentulous alveolar ridge: Secondary | ICD-10-CM | POA: Diagnosis present

## 2024-03-12 LAB — CBC WITH DIFFERENTIAL/PLATELET
Abs Immature Granulocytes: 0.01 K/uL (ref 0.00–0.07)
Basophils Absolute: 0 K/uL (ref 0.0–0.1)
Basophils Relative: 0 %
Eosinophils Absolute: 0.4 K/uL (ref 0.0–0.5)
Eosinophils Relative: 5 %
HCT: 34.1 % — ABNORMAL LOW (ref 36.0–46.0)
Hemoglobin: 11.2 g/dL — ABNORMAL LOW (ref 12.0–15.0)
Immature Granulocytes: 0 %
Lymphocytes Relative: 18 %
Lymphs Abs: 1.2 K/uL (ref 0.7–4.0)
MCH: 30.1 pg (ref 26.0–34.0)
MCHC: 32.8 g/dL (ref 30.0–36.0)
MCV: 91.7 fL (ref 80.0–100.0)
Monocytes Absolute: 0.8 K/uL (ref 0.1–1.0)
Monocytes Relative: 11 %
Neutro Abs: 4.4 K/uL (ref 1.7–7.7)
Neutrophils Relative %: 66 %
Platelets: 229 K/uL (ref 150–400)
RBC: 3.72 MIL/uL — ABNORMAL LOW (ref 3.87–5.11)
RDW: 11.9 % (ref 11.5–15.5)
WBC: 6.7 K/uL (ref 4.0–10.5)
nRBC: 0 % (ref 0.0–0.2)

## 2024-03-12 LAB — PROTIME-INR
INR: 1 (ref 0.8–1.2)
Prothrombin Time: 13.9 s (ref 11.4–15.2)

## 2024-03-12 NOTE — ED Triage Notes (Addendum)
 Pt reports she was sitting in her chair tonight when her mouth began to bleed, pt denies injury or trauma to area. Pt states she does take plavix  daily. Pt has had constant bleeding for the past hour

## 2024-03-13 ENCOUNTER — Emergency Department
Admission: EM | Admit: 2024-03-13 | Discharge: 2024-03-13 | Disposition: A | Discharge status: Home / Self Care | Attending: Emergency Medicine | Admitting: Emergency Medicine

## 2024-03-13 DIAGNOSIS — K068 Other specified disorders of gingiva and edentulous alveolar ridge: Secondary | ICD-10-CM

## 2024-03-13 DIAGNOSIS — D500 Iron deficiency anemia secondary to blood loss (chronic): Secondary | ICD-10-CM

## 2024-03-13 LAB — BASIC METABOLIC PANEL WITH GFR
Anion gap: 11 (ref 5–15)
BUN: 37 mg/dL — ABNORMAL HIGH (ref 8–23)
CO2: 25 mmol/L (ref 22–32)
Calcium: 9.7 mg/dL (ref 8.9–10.3)
Chloride: 103 mmol/L (ref 98–111)
Creatinine, Ser: 1.19 mg/dL — ABNORMAL HIGH (ref 0.44–1.00)
GFR, Estimated: 46 mL/min — ABNORMAL LOW (ref >60)
Glucose, Bld: 292 mg/dL — ABNORMAL HIGH (ref 70–99)
Potassium: 4.4 mmol/L (ref 3.5–5.1)
Sodium: 139 mmol/L (ref 135–145)

## 2024-03-13 LAB — HEMOGLOBIN AND HEMATOCRIT, BLOOD
HCT: 30.9 % — ABNORMAL LOW (ref 36.0–46.0)
Hemoglobin: 9.9 g/dL — ABNORMAL LOW (ref 12.0–15.0)

## 2024-03-13 LAB — TYPE AND SCREEN
ABO/RH(D): O POS
Antibody Screen: NEGATIVE

## 2024-03-13 MED ORDER — EPINEPHRINE 1 MG/10ML IJ SOSY
PREFILLED_SYRINGE | INTRAMUSCULAR | Status: AC
  Filled 2024-03-13: qty 10

## 2024-03-13 MED ORDER — LIDOCAINE-EPINEPHRINE (PF) 2 %-1:200000 IJ SOLN
20.0000 mL | Freq: Once | INTRAMUSCULAR | Status: AC
  Administered 2024-03-13: 20 mL via INTRADERMAL
  Filled 2024-03-13: qty 20

## 2024-03-13 MED ORDER — OXYMETAZOLINE HCL 0.05 % NA SOLN
1.0000 | Freq: Once | NASAL | Status: AC
  Administered 2024-03-13: 1 via NASAL
  Filled 2024-03-13: qty 30

## 2024-03-13 MED ORDER — TRANEXAMIC ACID-NACL 1000-0.7 MG/100ML-% IV SOLN
1000.0000 mg | Freq: Once | INTRAVENOUS | Status: AC
  Administered 2024-03-13: 1000 mg via INTRAVENOUS
  Filled 2024-03-13: qty 100

## 2024-03-13 MED ORDER — TRANEXAMIC ACID FOR INHALATION
500.0000 mg | Freq: Once | RESPIRATORY_TRACT | Status: AC
  Administered 2024-03-13: 500 mg via RESPIRATORY_TRACT
  Filled 2024-03-13: qty 10

## 2024-03-13 MED ORDER — TRANEXAMIC ACID FOR EPISTAXIS
500.0000 mg | Freq: Once | TOPICAL | Status: AC
  Administered 2024-03-13: 500 mg via TOPICAL
  Filled 2024-03-13: qty 10

## 2024-03-13 NOTE — ED Provider Notes (Signed)
 [facility] Provider Note    Event Date/Time   First MD Initiated Contact with Patient 03/13/24 [phone]     (approximate)   History   Bleeding/Bruising (oral)   HPI  Erin Daniels is a 78 y.o. female with history of stroke on aspirin  and Plavix , hypertension, diabetes, hyperlipidemia, hypothyroidism who presents to the emergency department with bleeding from her gums that started just prior to arrival.  She denies any trauma to her face, falls, recent dental work.  Denies any facial pain.  She states that she ate pizza for dinner.  She has never had this happen before.   History provided by patient, husband.    Past Medical History:  Diagnosis Date   Anemia    Angina pectoris (HCC)    At high risk for falls    Blind right eye    Cancer (HCC)    Carpal tunnel syndrome    BILATERAL   Cervicalgia    Complication of anesthesia    difficult to arouse   Diabetes mellitus without complication (HCC)    Type I   Diabetic neuropathy (HCC)    Diabetic neuropathy (HCC)    Diabetic retinopathy (HCC)    Diabetic retinopathy (HCC)    Dizziness    Gastroparesis    Headache    MIGRAINE   Herniated disc, cervical    Hypercholesteremia    Hypertension    Hypokalemia    Hypotensive episode    Hypothyroidism    Insulin  pump in place    Myofascial muscle pain    Numbness of arm    left   Osteoporosis    Poor vision    Rosacea    Seizures (HCC)    Slurred speech    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   Stroke (HCC)    x5   Vertigo    Wears glasses     Past Surgical History:  Procedure Laterality Date   APPENDECTOMY     AUGMENTATION MAMMAPLASTY Bilateral 2011   BILATERAL CARPAL TUNNEL RELEASE     BITRECTOMY     MEMBRAIN PEEL EYE   COLONOSCOPY     COLONOSCOPY WITH PROPOFOL  N/A 03/17/2020   Procedure: COLONOSCOPY WITH PROPOFOL ;  Surgeon: Toledo, Ladell POUR, MD;  Location: [facility] END[facility];  Service: Gastroenterology;  Laterality: N/A;   EYE  SURGERY Bilateral    cataract   FRACTURE SURGERY     HARDWARE REMOVAL Left 06/28/2021   Procedure: HARDWARE REMOVAL;  Surgeon: Kathlynn Sharper, MD;  Location: [facility] ORS;  Service: Orthopedics;  Laterality: Left;   HEMORROIDECTOMY     HERNIA REPAIR     umbilical   INTRAMEDULLARY (IM) NAIL INTERTROCHANTERIC Right 12/07/2018   Procedure: INTRAMEDULLARY (IM) NAIL INTERTROCHANTRIC;  Surgeon: Krasinski, Kevin, MD;  Location: [facility] ORS;  Service: Orthopedics;  Laterality: Right;   JOINT REPLACEMENT     right hip   KYPHOPLASTY N/A 05/12/2021   Procedure: KYPHOPLASTY - T11;  Surgeon: Kathlynn Sharper, MD;  Location: [facility] ORS;  Service: Orthopedics;  Laterality: N/A;   OPEN REDUCTION INTERNAL FIXATION (ORIF) DISTAL RADIAL FRACTURE Right 05/29/2015   Procedure: OPEN REDUCTION INTERNAL FIXATION (ORIF) RIGHT DISTAL RADIUS FRACTURE WITH ALLOGRAFT BONE GRAFTING FOR REPAIR AND RECONSTRUCTION;  Surgeon: Elsie Mussel, MD;  Location: [facility] OR;  Service: Orthopedics;  Laterality: Right;   OPEN REDUCTION INTERNAL FIXATION (ORIF) METACARPAL Right 01/09/2019   Procedure: OPEN REDUCTION INTERNAL FIXATION (ORIF) RIGHT SECOND METACARPAL FRACTURE;  Surgeon: Edie Norleen PARAS, MD;  Location: [facility]  ORS;  Service: Orthopedics;  Laterality: Right;   ORIF ELBOW FRACTURE Left 05/12/2021   Procedure: OPEN REDUCTION INTERNAL FIXATION (ORIF) ELBOW/OLECRANON FRACTURE;  Surgeon: Kathlynn Sharper, MD;  Location: [facility] ORS;  Service: Orthopedics;  Laterality: Left;   SHOULDER ARTHROSCOPY Left    SHOULDER HEMI-ARTHROPLASTY Left    SYNOVECTOMY     TONSILLECTOMY     TRIGGER FINGER RELEASE     TUBAL LIGATION      MEDICATIONS:  Prior to Admission medications   Medication Sig Start Date End Date Taking? Authorizing Provider  Ascorbic Acid  (VITAMIN C ) 1000 MG tablet Take 1,000 mg by mouth in the morning and at bedtime.    [provider]  aspirin  EC 81 MG tablet Take 81 mg by mouth in the morning.    [provider]   atorvastatin  (LIPITOR) 40 MG tablet Take 40 mg by mouth daily at 6 PM. 04/13/21   [provider]  butalbital -acetaminophen -caffeine  (FIORICET ) 50-325-40 MG tablet Take 0.5 tablets by mouth 2 (two) times daily as needed for headache.    [provider]  Calcium  Carb-Cholecalciferol  600-200 MG-UNIT TABS Take 1 tablet by mouth daily with supper.     [provider]  cephALEXin  (KEFLEX ) 500 MG capsule Take 500 mg by mouth 4 (four) times daily. 06/21/21   [provider]  cetirizine  (ZYRTEC ) 10 MG tablet Take 1 tablet (10 mg total) by mouth daily. Patient taking differently: Take 10 mg by mouth every morning. 10/10/18   Medina-Vargas, Monina C, NP  Cholecalciferol  (VITAMIN D -3) 5000 units TABS Take 5,000 Units by mouth in the morning.    [provider]  clopidogrel  (PLAVIX ) 75 MG tablet Take 75 mg by mouth daily with supper.    [provider]  dorzolamide-timolol  (COSOPT) 22.3-6.8 MG/ML ophthalmic solution Place 1 drop into both eyes in the morning, at noon, and at bedtime. 04/27/21   [provider]  ferrous sulfate  325 (65 FE) MG tablet Take 325 mg by mouth daily with supper.     [provider]  gabapentin  (NEURONTIN ) 300 MG capsule 300 mg in the morning, 300 mg in the afternoon, 900 mg in the evening Patient taking differently: Take 300-900 mg by mouth See admin instructions. Take 1 capsule (300mg ) by mouth every morning, 1 capsule (300mg ) by mouth at lunch and take 3 capsules (900mg ) by mouth every night at bedtime 10/10/18   Medina-Vargas, Monina C, NP  GLUCAGON EMERGENCY 1 MG injection Inject 1 mg into the muscle once. 01/24/19   [provider]  glucose 4 GM chewable tablet Chew 1 tablet (4 g total) by mouth as needed for low blood sugar. <65 10/10/18   Medina-Vargas, Monina C, NP  Insulin  Disposable Pump (OMNIPOD CLASSIC PODS, GEN 3,) MISC Inject 1 Piece into the skin every 3 (three) days. 04/18/21   [provider]  losartan  (COZAAR ) 50 MG tablet Take 50 mg by mouth every evening.    [provider]  Multiple Vitamin (MULTIVITAMIN WITH MINERALS) TABS tablet Take 1 tablet by mouth every evening.    [provider]  Netarsudil -Latanoprost  0.02-0.005 % SOLN Place 1 drop into the right eye at bedtime.  Patient not taking: Reported on 06/27/2021    [provider]  NOVOLOG  100 UNIT/ML injection Inject into the skin as directed. Use with insulin  pump 04/27/21   [provider]  Polyethyl Glycol-Propyl Glycol (SYSTANE ULTRA) 0.4-0.3 % SOLN Place 1-2 drops into both eyes 3 (three) times daily as needed (dry/irritated  eyes). Patient not taking: Reported on 06/27/2021    [provider]  polyethylene glycol (MIRALAX  / GLYCOLAX ) packet Take 17 g by mouth every other day as needed (constipation).    [provider]  promethazine  (PHENERGAN ) 25 MG suppository Place 25 mg rectally every 6 (six) hours as needed for nausea or vomiting.    [provider]  venlafaxine  (EFFEXOR ) 75 MG tablet Take 1 tablet (75 mg total) by mouth 2 (two) times daily. 10/10/18   Medina-Vargas, Jereld BROCKS, NP    Physical Exam   Triage Vital Signs: ED Triage Vitals  Encounter Vitals Group     BP 03/12/24 2254 (!) 149/79     Girls Systolic BP Percentile --      Girls Diastolic BP Percentile --      Boys Systolic BP Percentile --      Boys Diastolic BP Percentile --      Pulse Rate 03/12/24 2254 84     Resp 03/12/24 2254 18     Temp 03/12/24 2254 98.6 F (37 C)     Temp src --      SpO2 03/12/24 2254 100 %     Weight 03/12/24 2253 110 lb (49.9 kg)     Height 03/12/24 2253 5' 2 (1.575 m)     Head Circumference --      Peak Flow --      Pain Score 03/12/24 2252 0     Pain Loc --      Pain Education --      Exclude from Growth Chart --     Most recent vital signs: Vitals:   03/13/24 0453 03/13/24 0530  BP:  (!) 136/59  Pulse:  72  Resp:  16  Temp: (!) 97.3 F (36.3  C)   SpO2:  100%    CONSTITUTIONAL: Alert, responds appropriately to questions.  Nontoxic in appearance, elderly, anxious HEAD: Normocephalic, atraumatic EYES: Conjunctivae clear, pupils appear equal, sclera nonicteric ENT: normal nose; moist mucous membranes, airway patent, no stridor, no trismus or drooling, patient has brisk bright red bleeding noted around the right molars without gingival swelling, sign of dental abscess, loose or tender teeth.  Tongue appears normal.  No angioedema. NECK: Supple, normal ROM CARD: RRR; S1 and S2 appreciated RESP: Normal chest excursion without splinting or tachypnea; breath sounds clear and equal bilaterally; no wheezes, no rhonchi, no rales, no hypoxia or respiratory distress, speaking full sentences ABD/GI: Non-distended; soft, non-tender, no rebound, no guarding, no peritoneal signs BACK: The back appears normal EXT: Normal ROM in all joints; no deformity noted, no edema SKIN: Normal color for age and race; warm; no rash on exposed skin NEURO: Moves all extremities equally, mild dysarthria which is chronic for patient PSYCH: The patient's mood and manner are appropriate.   ED Results / Procedures / Treatments   LABS: (all labs ordered are listed, but only abnormal results are displayed) Labs Reviewed  CBC WITH DIFFERENTIAL/PLATELET - Abnormal; Notable for the following components:      Result Value   RBC 3.72 (*)    Hemoglobin 11.2 (*)    HCT 34.1 (*)    All other components within normal limits  BASIC METABOLIC PANEL WITH GFR - Abnormal; Notable for the following components:   Glucose, Bld 292 (*)    BUN 37 (*)    Creatinine, Ser 1.19 (*)    GFR, Estimated 46 (*)    All other components within normal limits  HEMOGLOBIN AND HEMATOCRIT, BLOOD -  Abnormal; Notable for the following components:   Hemoglobin 9.9 (*)    HCT 30.9 (*)    All other components within normal limits  PROTIME-INR  TYPE AND SCREEN     EKG:   RADIOLOGY: My  personal review and interpretation of imaging:    I have personally reviewed all radiology reports.   No results found.   PROCEDURES:  Critical Care performed: Yes, see critical care procedure note(s)   CRITICAL CARE Performed by: Josette Sink   Total critical care time: 30 minutes  Critical care time was exclusive of separately billable procedures and treating other patients.  Critical care was necessary to treat or prevent imminent or life-threatening deterioration.  Critical care was time spent personally by me on the following activities: development of treatment plan with patient and/or surrogate as well as nursing, discussions with consultants, evaluation of patient's response to treatment, examination of patient, obtaining history from patient or surrogate, ordering and performing treatments and interventions, ordering and review of laboratory studies, ordering and review of radiographic studies, pulse oximetry and re-evaluation of patient's condition.   SABRA1-3 Lead EKG Interpretation  Performed by: Belkis Norbeck, Josette SAILOR, DO Authorized by: Davonte Siebenaler, Josette SAILOR, DO     Interpretation: normal     ECG rate:  72   ECG rate assessment: normal     Rhythm: sinus rhythm     Ectopy: none     Conduction: normal       IMPRESSION / MDM / ASSESSMENT AND PLAN / ED COURSE  I reviewed the triage vital signs and the nursing notes.    Patient here with complaints of bleeding around her gums.  She is on Plavix , aspirin .  The patient is on the cardiac monitor to evaluate for evidence of arrhythmia and/or significant heart rate changes.   DIFFERENTIAL DIAGNOSIS (includes but not limited to):   Bleeding secondary to antiplatelet medication, gingivitis, periapical abscess, thrombocytopenia, coagulopathy, anemia   Patient's presentation is most consistent with acute presentation with potential threat to life or bodily function.   PLAN: Patient has brisk bleeding from around the gumline of her  right lower teeth.  Her teeth appear normal and are not loose and there is no tenderness.  There is no mass appreciated and no obvious sign of infection or irritation of the gums.  Unclear what is causing this area to bleed but she is on 2 antiplatelet agents.  Will apply TXA soaked gauze and give TXA neb.   MEDICATIONS GIVEN IN ED: Medications  tranexamic acid  (CYKLOKAPRON ) 1000 MG/10ML nebulizer solution 500 mg (500 mg Nebulization Given 03/13/24 0051)  oxymetazoline  (AFRIN) 0.05 % nasal spray 1 spray (1 spray Each Nare Given 03/13/24 0051)  oxymetazoline  (AFRIN) 0.05 % nasal spray 1 spray (1 spray Each Nare Given 03/13/24 0142)  tranexamic acid  (CYKLOKAPRON ) 1000 MG/10ML topical solution 500 mg (500 mg Topical Given 03/13/24 0142)  lidocaine -EPINEPHrine  (XYLOCAINE  W/EPI) 2 %-1:200000 (PF) injection 20 mL (20 mLs Intradermal Given by Other 03/13/24 0305)  tranexamic acid  (CYKLOKAPRON ) IVPB 1,000 mg (0 mg Intravenous Stopped 03/13/24 0331)  EPINEPHrine  (ADRENALIN ) 1 MG/10ML injection (  Given by Other 03/13/24 0318)  EPINEPHrine  (ADRENALIN ) 1 MG/10ML injection (  Given by Other 03/13/24 0319)     ED COURSE: No improvement with TXA soaked gauze, TXA neb.  Have also tried Afrin soaked gauze, epinephrine  soaked gauze that only briefly slow her bleeding.  She continues to be hemodynamically stable but I am worried that if we do not stop this bleeding  soon and that she may end up needing a blood transfusion.  Her initial hemoglobin is 11.2.  Normal platelets.  Normal INR.  Will send a type and screen.  We did discuss risk and benefits of IV TXA and she agrees to proceed.  We discussed also injecting this area with lidocaine  with epinephrine  but given there are multiple areas of bleeding I am not quite sure if this will be as effective.  I also worry that further punctures in the gums would worsen the bleeding.   After IV TXA and further pressure with epi soaked gauze, bleeding has now stopped.  She has been  monitored for approximately 45 minutes after bleeding is stopped and has been able to drink without difficulty.  Her repeat hemoglobin has dropped down to 9.9 but she remains hemodynamically stable.  She states she is already on iron  supplementation at home and have advised her to continue this and follow-up with her PCP to have her hemoglobin rechecked.  Discussed return precautions with patient and husband.  They verbalized understanding and are comfortable with this plan.  Will discharge.  I did advise her to hold her aspirin  and Plavix  today and contact her PCP for further recommendations of when to resume.   At this time, I do not feel there is any life-threatening condition present. I reviewed all nursing notes, vitals, pertinent previous records.  All lab and urine results, EKGs, imaging ordered have been independently reviewed and interpreted by myself.  I reviewed all available radiology reports from any imaging ordered this visit.  Based on my assessment, I feel the patient is safe to be discharged home without further emergent workup and can continue workup as an outpatient as needed. Discussed all findings, treatment plan as well as usual and customary return precautions.  They verbalize understanding and are comfortable with this plan.  Outpatient follow-up has been provided as needed.  All questions have been answered.       CONSULTS: Admission considered but bleeding stopped, patient hemodynamically stable, repeat hemoglobin 9.9.  OUTSIDE RECORDS REVIEWED: Reviewed last nephrology note on 02/18/2024.       FINAL CLINICAL IMPRESSION(S) / ED DIAGNOSES   Final diagnoses:  Bleeding gums  Blood loss anemia     Rx / DC Orders   ED Discharge Orders     None        Note:  This document was prepared using Dragon voice recognition software and may include unintentional dictation errors.   Levonia Wolfley, Josette SAILOR, DO 03/13/24 [phone]

## 2024-03-13 NOTE — ED Notes (Signed)
 Soaked gauze placed

## 2024-03-13 NOTE — Discharge Instructions (Signed)
 Please take your iron  tablets daily.  I recommend close follow-up with your primary care doctor to discuss if you should continue your aspirin  or Plavix  for the next few days.  I do recommend that you hold it today.  Also recommend that you have your blood counts rechecked by your primary care doctor in 1 to 2 weeks.  If you begin bleeding again from your gums, I recommend holding gauze to this area for 20 to 30 minutes without stopping.  If this does not stop the bleeding, you will need to return to the emergency department.

## 2024-04-06 ENCOUNTER — Other Ambulatory Visit: Payer: Self-pay

## 2024-04-06 ENCOUNTER — Emergency Department
Admission: EM | Admit: 2024-04-06 | Discharge: 2024-04-06 | Disposition: A | Discharge status: Home / Self Care | Attending: Emergency Medicine | Admitting: Emergency Medicine

## 2024-04-06 DIAGNOSIS — Z7902 Long term (current) use of antithrombotics/antiplatelets: Secondary | ICD-10-CM | POA: Diagnosis not present

## 2024-04-06 DIAGNOSIS — E119 Type 2 diabetes mellitus without complications: Secondary | ICD-10-CM | POA: Insufficient documentation

## 2024-04-06 DIAGNOSIS — K068 Other specified disorders of gingiva and edentulous alveolar ridge: Secondary | ICD-10-CM | POA: Diagnosis present

## 2024-04-06 DIAGNOSIS — I10 Essential (primary) hypertension: Secondary | ICD-10-CM | POA: Diagnosis not present

## 2024-04-06 LAB — CBC WITH DIFFERENTIAL/PLATELET
Abs Immature Granulocytes: 0.01 K/uL (ref 0.00–0.07)
Basophils Absolute: 0 K/uL (ref 0.0–0.1)
Basophils Relative: 1 %
Eosinophils Absolute: 0.2 K/uL (ref 0.0–0.5)
Eosinophils Relative: 5 %
HCT: 29.4 % — ABNORMAL LOW (ref 36.0–46.0)
Hemoglobin: 9.6 g/dL — ABNORMAL LOW (ref 12.0–15.0)
Immature Granulocytes: 0 %
Lymphocytes Relative: 23 %
Lymphs Abs: 1.1 K/uL (ref 0.7–4.0)
MCH: 30.1 pg (ref 26.0–34.0)
MCHC: 32.7 g/dL (ref 30.0–36.0)
MCV: 92.2 fL (ref 80.0–100.0)
Monocytes Absolute: 0.6 K/uL (ref 0.1–1.0)
Monocytes Relative: 13 %
Neutro Abs: 2.8 K/uL (ref 1.7–7.7)
Neutrophils Relative %: 58 %
Platelets: 222 K/uL (ref 150–400)
RBC: 3.19 MIL/uL — ABNORMAL LOW (ref 3.87–5.11)
RDW: 12 % (ref 11.5–15.5)
WBC: 4.8 K/uL (ref 4.0–10.5)
nRBC: 0 % (ref 0.0–0.2)

## 2024-04-06 LAB — BASIC METABOLIC PANEL WITH GFR
Anion gap: 11 (ref 5–15)
BUN: 30 mg/dL — ABNORMAL HIGH (ref 8–23)
CO2: 26 mmol/L (ref 22–32)
Calcium: 9.7 mg/dL (ref 8.9–10.3)
Chloride: 105 mmol/L (ref 98–111)
Creatinine, Ser: 1.07 mg/dL — ABNORMAL HIGH (ref 0.44–1.00)
GFR, Estimated: 53 mL/min — ABNORMAL LOW (ref >60)
Glucose, Bld: 167 mg/dL — ABNORMAL HIGH (ref 70–99)
Potassium: 4 mmol/L (ref 3.5–5.1)
Sodium: 142 mmol/L (ref 135–145)

## 2024-04-06 MED ORDER — TRANEXAMIC ACID FOR EPISTAXIS
500.0000 mg | Freq: Once | TOPICAL | Status: AC
  Administered 2024-04-06: 500 mg via TOPICAL
  Filled 2024-04-06: qty 5
  Filled 2024-04-06: qty 10

## 2024-04-06 NOTE — ED Provider Notes (Signed)
 [facility] Provider Note    Event Date/Time   First MD Initiated Contact with Patient 04/06/24 1320     (approximate)   History   Chief Complaint Bleeding Gums   HPI  Erin Daniels is a 78 y.o. female with past medical history of hypertension, hyperlipidemia, diabetes, stroke, and chronic pain syndrome who presents to the ED complaining of bleeding gums.  Patient reports that she went to bed around 2 AM this morning, woke up around 11:30 AM with blood all over her shirt that seem to have come from her mouth.  She states that she noticed bleeding from her gums after getting up, was seen in the ED at the end of last month for similar bleeding.  She is currently taking Plavix , no longer takes daily baby aspirin .  She denies any pain or swelling around her mouth.     Physical Exam   Triage Vital Signs: ED Triage Vitals [04/06/24 1300]  Encounter Vitals Group     BP 135/76     Girls Systolic BP Percentile      Girls Diastolic BP Percentile      Boys Systolic BP Percentile      Boys Diastolic BP Percentile      Pulse Rate 82     Resp 15     Temp (!) 97.3 F (36.3 C)     Temp Source Axillary     SpO2 97 %     Weight      Height      Head Circumference      Peak Flow      Pain Score 0     Pain Loc      Pain Education      Exclude from Growth Chart     Most recent vital signs: Vitals:   04/06/24 1300 04/06/24 1321  BP: 135/76   Pulse: 82   Resp: 15   Temp: (!) 97.3 F (36.3 C)   SpO2: 97% 99%    Constitutional: Alert and oriented. Eyes: Conjunctivae are normal. Head: Atraumatic. Nose: No congestion/rhinnorhea. Mouth/Throat: Mucous membranes are moist.  Dried blood around gumline of right maxilla and mandible.  No active bleeding noted. Cardiovascular: Normal rate, regular rhythm. Grossly normal heart sounds.  2+ radial pulses bilaterally. Respiratory: Normal respiratory effort.  No retractions. Lungs CTAB. Gastrointestinal: Soft  and nontender. No distention. Musculoskeletal: No lower extremity tenderness nor edema.  Neurologic:  Normal speech and language. No gross focal neurologic deficits are appreciated.    ED Results / Procedures / Treatments   Labs (all labs ordered are listed, but only abnormal results are displayed) Labs Reviewed  CBC WITH DIFFERENTIAL/PLATELET - Abnormal; Notable for the following components:      Result Value   RBC 3.19 (*)    Hemoglobin 9.6 (*)    HCT 29.4 (*)    All other components within normal limits  BASIC METABOLIC PANEL WITH GFR - Abnormal; Notable for the following components:   Glucose, Bld 167 (*)    BUN 30 (*)    Creatinine, Ser 1.07 (*)    GFR, Estimated 53 (*)    All other components within normal limits    PROCEDURES:  Critical Care performed: No  Procedures   MEDICATIONS ORDERED IN ED: Medications  tranexamic acid  (CYKLOKAPRON ) 1000 MG/10ML topical solution 500 mg (500 mg Topical Given by Other 04/06/24 1334)     IMPRESSION / MDM / ASSESSMENT AND PLAN / ED COURSE  I  reviewed the triage vital signs and the nursing notes.                              78 y.o. female with past medical history of hypertension, hyperlipidemia, diabetes, stroke, and chronic pain syndrome who presents to the ED complaining of bleeding from around her gums after waking up this morning.  Patient's presentation is most consistent with acute presentation with potential threat to life or bodily function.  Differential diagnosis includes, but is not limited to, bleeding gums, dental infection, anemia, coagulopathy.  Patient nontoxic-appearing and in no acute distress, vital signs are unremarkable.  Patient had gauze placed in her mouth in triage, this was removed with no signs of ongoing bleeding.  Patient did have significant bleeding that was difficult to control on prior ED visit, eventually requiring IV TXA.  We will observe her here in the ED and place TXA soaked gauze around  her lower gumline, which seems to be the source of bleeding.  Labs are reassuring with stable anemia compared to previous, no significant leukocytosis, electrolyte abnormality, or AKI.  Platelets are normal and INR found to be normal on previous visit.  No ongoing bleeding noted with removal of TXA soaked gauze.  Patient was observed for about another hour with no recurrence of bleeding.  She is appropriate for discharge home, was counseled on soft diet and PCP follow-up.  She was counseled to return to the ED for new or worsening symptoms, patient and spouse agree with plan.      FINAL CLINICAL IMPRESSION(S) / ED DIAGNOSES   Final diagnoses:  Bleeding gums     Rx / DC Orders   ED Discharge Orders     None        Note:  This document was prepared using Dragon voice recognition software and may include unintentional dictation errors.   Willo Dunnings, MD 04/06/24 [phone]

## 2024-04-06 NOTE — ED Triage Notes (Signed)
 Pt arrives with c/o bleeding gums. Per pt, she woke up with her gums bleeding. Pt unsure when it exactly started. Pt takes plavix . Pt has had this issue before. Gauze reapplied in triage and bleeding continues.

## 2024-05-12 ENCOUNTER — Other Ambulatory Visit: Payer: Self-pay | Admitting: Nephrology

## 2024-05-12 DIAGNOSIS — N1832 Chronic kidney disease, stage 3b: Secondary | ICD-10-CM

## 2024-05-12 DIAGNOSIS — R809 Proteinuria, unspecified: Secondary | ICD-10-CM

## 2024-05-12 DIAGNOSIS — D631 Anemia in chronic kidney disease: Secondary | ICD-10-CM

## 2024-05-16 ENCOUNTER — Ambulatory Visit
Admission: RE | Admit: 2024-05-16 | Discharge: 2024-05-16 | Disposition: A | Discharge status: Home / Self Care | Source: Ambulatory Visit | Attending: Nephrology | Admitting: Nephrology

## 2024-05-16 DIAGNOSIS — D631 Anemia in chronic kidney disease: Secondary | ICD-10-CM | POA: Insufficient documentation

## 2024-05-16 DIAGNOSIS — N1832 Chronic kidney disease, stage 3b: Secondary | ICD-10-CM | POA: Insufficient documentation

## 2024-05-16 DIAGNOSIS — R809 Proteinuria, unspecified: Secondary | ICD-10-CM | POA: Diagnosis present
